# Patient Record
Sex: Female | Born: 1955
Health system: Southern US, Community
[De-identification: ages and names within clinical notes are randomized; demographics above are authoritative.]

## PROBLEM LIST (undated history)

## (undated) DIAGNOSIS — E785 Hyperlipidemia, unspecified: Secondary | ICD-10-CM

## (undated) DIAGNOSIS — M79672 Pain in left foot: Secondary | ICD-10-CM

## (undated) DIAGNOSIS — I10 Essential (primary) hypertension: Secondary | ICD-10-CM

## (undated) HISTORY — DX: Hyperlipidemia, unspecified: E78.5

## (undated) HISTORY — PX: TUBAL LIGATION: SHX77

## (undated) HISTORY — DX: Pain in left foot: M79.672

## (undated) HISTORY — DX: Essential (primary) hypertension: I10

## (undated) HISTORY — PX: EYE SURGERY: SHX253

## (undated) HISTORY — PX: LUMBAR DISC SURGERY: SHX700

---

## 1997-11-02 ENCOUNTER — Encounter: Admission: RE | Admit: 1997-11-02 | Discharge: 1997-11-02 | Payer: Self-pay | Admitting: Family Medicine

## 1997-11-24 ENCOUNTER — Encounter: Admission: RE | Admit: 1997-11-24 | Discharge: 1997-11-24 | Payer: Self-pay | Admitting: Family Medicine

## 1997-12-03 ENCOUNTER — Encounter: Admission: RE | Admit: 1997-12-03 | Discharge: 1997-12-03 | Payer: Self-pay | Admitting: Family Medicine

## 1997-12-20 ENCOUNTER — Encounter: Admission: RE | Admit: 1997-12-20 | Discharge: 1997-12-20 | Payer: Self-pay | Admitting: Family Medicine

## 1997-12-27 ENCOUNTER — Encounter: Admission: RE | Admit: 1997-12-27 | Discharge: 1997-12-27 | Payer: Self-pay | Admitting: Family Medicine

## 1998-01-03 ENCOUNTER — Encounter: Admission: RE | Admit: 1998-01-03 | Discharge: 1998-01-03 | Payer: Self-pay | Admitting: Family Medicine

## 1998-02-11 ENCOUNTER — Encounter: Admission: RE | Admit: 1998-02-11 | Discharge: 1998-02-11 | Payer: Self-pay | Admitting: Family Medicine

## 1998-03-04 ENCOUNTER — Encounter: Admission: RE | Admit: 1998-03-04 | Discharge: 1998-03-04 | Payer: Self-pay | Admitting: Family Medicine

## 1998-03-31 ENCOUNTER — Encounter: Admission: RE | Admit: 1998-03-31 | Discharge: 1998-03-31 | Payer: Self-pay | Admitting: Family Medicine

## 1998-04-11 ENCOUNTER — Encounter: Admission: RE | Admit: 1998-04-11 | Discharge: 1998-04-11 | Payer: Self-pay | Admitting: Family Medicine

## 1998-04-20 ENCOUNTER — Encounter: Admission: RE | Admit: 1998-04-20 | Discharge: 1998-04-20 | Payer: Self-pay | Admitting: Family Medicine

## 1998-04-29 ENCOUNTER — Encounter: Admission: RE | Admit: 1998-04-29 | Discharge: 1998-04-29 | Payer: Self-pay | Admitting: Family Medicine

## 1998-05-13 ENCOUNTER — Encounter: Payer: Self-pay | Admitting: Emergency Medicine

## 1998-05-13 ENCOUNTER — Emergency Department (HOSPITAL_COMMUNITY): Admission: EM | Admit: 1998-05-13 | Discharge: 1998-05-13 | Payer: Self-pay | Admitting: Emergency Medicine

## 1998-05-16 ENCOUNTER — Encounter: Admission: RE | Admit: 1998-05-16 | Discharge: 1998-05-16 | Payer: Self-pay | Admitting: Family Medicine

## 1998-05-18 ENCOUNTER — Encounter: Admission: RE | Admit: 1998-05-18 | Discharge: 1998-05-18 | Payer: Self-pay | Admitting: Family Medicine

## 1998-05-19 ENCOUNTER — Encounter: Admission: RE | Admit: 1998-05-19 | Discharge: 1998-05-19 | Payer: Self-pay | Admitting: Family Medicine

## 1998-05-26 ENCOUNTER — Encounter: Admission: RE | Admit: 1998-05-26 | Discharge: 1998-05-26 | Payer: Self-pay | Admitting: Family Medicine

## 1998-06-13 ENCOUNTER — Encounter: Admission: RE | Admit: 1998-06-13 | Discharge: 1998-06-13 | Payer: Self-pay | Admitting: Family Medicine

## 1998-07-05 ENCOUNTER — Encounter: Admission: RE | Admit: 1998-07-05 | Discharge: 1998-07-05 | Payer: Self-pay | Admitting: Family Medicine

## 1998-07-11 ENCOUNTER — Encounter: Admission: RE | Admit: 1998-07-11 | Discharge: 1998-07-11 | Payer: Self-pay | Admitting: Sports Medicine

## 1998-07-18 ENCOUNTER — Encounter: Admission: RE | Admit: 1998-07-18 | Discharge: 1998-07-18 | Payer: Self-pay | Admitting: Family Medicine

## 1998-08-18 ENCOUNTER — Encounter: Admission: RE | Admit: 1998-08-18 | Discharge: 1998-08-18 | Payer: Self-pay | Admitting: Family Medicine

## 1998-09-09 ENCOUNTER — Encounter: Admission: RE | Admit: 1998-09-09 | Discharge: 1998-09-09 | Payer: Self-pay | Admitting: Family Medicine

## 1998-10-12 ENCOUNTER — Encounter: Admission: RE | Admit: 1998-10-12 | Discharge: 1998-10-12 | Payer: Self-pay | Admitting: Family Medicine

## 1998-10-12 ENCOUNTER — Other Ambulatory Visit: Admission: RE | Admit: 1998-10-12 | Discharge: 1998-10-12 | Payer: Self-pay | Admitting: *Deleted

## 1998-10-14 ENCOUNTER — Encounter: Admission: RE | Admit: 1998-10-14 | Discharge: 1998-10-14 | Payer: Self-pay | Admitting: Family Medicine

## 1998-10-24 ENCOUNTER — Encounter: Admission: RE | Admit: 1998-10-24 | Discharge: 1998-10-24 | Payer: Self-pay | Admitting: Family Medicine

## 1998-10-26 ENCOUNTER — Encounter: Admission: RE | Admit: 1998-10-26 | Discharge: 1998-10-26 | Payer: Self-pay | Admitting: Family Medicine

## 1998-11-01 ENCOUNTER — Encounter: Admission: RE | Admit: 1998-11-01 | Discharge: 1998-11-01 | Payer: Self-pay | Admitting: Family Medicine

## 1998-11-03 ENCOUNTER — Encounter: Admission: RE | Admit: 1998-11-03 | Discharge: 1998-11-03 | Payer: Self-pay | Admitting: Family Medicine

## 1998-11-14 ENCOUNTER — Encounter: Admission: RE | Admit: 1998-11-14 | Discharge: 1998-11-14 | Payer: Self-pay | Admitting: Family Medicine

## 1998-12-07 ENCOUNTER — Encounter: Admission: RE | Admit: 1998-12-07 | Discharge: 1998-12-07 | Payer: Self-pay | Admitting: Family Medicine

## 1998-12-19 ENCOUNTER — Emergency Department (HOSPITAL_COMMUNITY): Admission: EM | Admit: 1998-12-19 | Discharge: 1998-12-19 | Payer: Self-pay | Admitting: Emergency Medicine

## 1998-12-20 ENCOUNTER — Encounter: Payer: Self-pay | Admitting: *Deleted

## 1998-12-30 ENCOUNTER — Encounter: Admission: RE | Admit: 1998-12-30 | Discharge: 1998-12-30 | Payer: Self-pay | Admitting: Family Medicine

## 1999-01-06 ENCOUNTER — Encounter: Admission: RE | Admit: 1999-01-06 | Discharge: 1999-01-06 | Payer: Self-pay | Admitting: Family Medicine

## 1999-01-19 ENCOUNTER — Encounter: Admission: RE | Admit: 1999-01-19 | Discharge: 1999-01-19 | Payer: Self-pay | Admitting: Sports Medicine

## 1999-01-27 ENCOUNTER — Encounter: Admission: RE | Admit: 1999-01-27 | Discharge: 1999-01-27 | Payer: Self-pay | Admitting: Family Medicine

## 1999-02-01 ENCOUNTER — Encounter: Admission: RE | Admit: 1999-02-01 | Discharge: 1999-02-01 | Payer: Self-pay | Admitting: Family Medicine

## 1999-02-06 ENCOUNTER — Encounter: Admission: RE | Admit: 1999-02-06 | Discharge: 1999-02-06 | Payer: Self-pay | Admitting: Family Medicine

## 1999-02-08 ENCOUNTER — Encounter: Admission: RE | Admit: 1999-02-08 | Discharge: 1999-02-08 | Payer: Self-pay | Admitting: Family Medicine

## 1999-02-19 ENCOUNTER — Emergency Department (HOSPITAL_COMMUNITY): Admission: EM | Admit: 1999-02-19 | Discharge: 1999-02-19 | Payer: Self-pay | Admitting: Emergency Medicine

## 1999-02-22 ENCOUNTER — Encounter: Admission: RE | Admit: 1999-02-22 | Discharge: 1999-02-22 | Payer: Self-pay | Admitting: Family Medicine

## 1999-03-17 ENCOUNTER — Encounter: Admission: RE | Admit: 1999-03-17 | Discharge: 1999-03-17 | Payer: Self-pay | Admitting: Family Medicine

## 1999-03-21 ENCOUNTER — Ambulatory Visit (HOSPITAL_COMMUNITY): Admission: RE | Admit: 1999-03-21 | Discharge: 1999-03-21 | Payer: Self-pay | Admitting: Sports Medicine

## 1999-03-21 ENCOUNTER — Encounter: Admission: RE | Admit: 1999-03-21 | Discharge: 1999-03-21 | Payer: Self-pay | Admitting: Sports Medicine

## 1999-03-29 ENCOUNTER — Encounter: Admission: RE | Admit: 1999-03-29 | Discharge: 1999-03-29 | Payer: Self-pay | Admitting: Family Medicine

## 1999-04-03 ENCOUNTER — Encounter: Admission: RE | Admit: 1999-04-03 | Discharge: 1999-04-03 | Payer: Self-pay | Admitting: Family Medicine

## 1999-04-12 ENCOUNTER — Encounter: Admission: RE | Admit: 1999-04-12 | Discharge: 1999-04-12 | Payer: Self-pay | Admitting: Family Medicine

## 1999-05-05 ENCOUNTER — Encounter: Admission: RE | Admit: 1999-05-05 | Discharge: 1999-05-05 | Payer: Self-pay | Admitting: Family Medicine

## 1999-05-08 ENCOUNTER — Encounter: Admission: RE | Admit: 1999-05-08 | Discharge: 1999-05-08 | Payer: Self-pay | Admitting: Family Medicine

## 1999-05-19 ENCOUNTER — Encounter: Admission: RE | Admit: 1999-05-19 | Discharge: 1999-05-19 | Payer: Self-pay | Admitting: Sports Medicine

## 1999-05-27 ENCOUNTER — Encounter: Payer: Self-pay | Admitting: Emergency Medicine

## 1999-05-27 ENCOUNTER — Emergency Department (HOSPITAL_COMMUNITY): Admission: EM | Admit: 1999-05-27 | Discharge: 1999-05-27 | Payer: Self-pay | Admitting: Emergency Medicine

## 1999-05-29 ENCOUNTER — Encounter: Admission: RE | Admit: 1999-05-29 | Discharge: 1999-05-29 | Payer: Self-pay | Admitting: Family Medicine

## 1999-05-31 ENCOUNTER — Encounter: Admission: RE | Admit: 1999-05-31 | Discharge: 1999-05-31 | Payer: Self-pay | Admitting: Family Medicine

## 1999-07-21 ENCOUNTER — Encounter: Admission: RE | Admit: 1999-07-21 | Discharge: 1999-07-21 | Payer: Self-pay | Admitting: Family Medicine

## 1999-07-31 ENCOUNTER — Encounter: Admission: RE | Admit: 1999-07-31 | Discharge: 1999-07-31 | Payer: Self-pay | Admitting: Family Medicine

## 1999-08-07 ENCOUNTER — Encounter: Admission: RE | Admit: 1999-08-07 | Discharge: 1999-08-07 | Payer: Self-pay | Admitting: Family Medicine

## 1999-08-21 ENCOUNTER — Encounter: Admission: RE | Admit: 1999-08-21 | Discharge: 1999-08-21 | Payer: Self-pay | Admitting: Family Medicine

## 1999-09-20 ENCOUNTER — Encounter: Admission: RE | Admit: 1999-09-20 | Discharge: 1999-09-20 | Payer: Self-pay | Admitting: Family Medicine

## 1999-09-27 ENCOUNTER — Encounter: Admission: RE | Admit: 1999-09-27 | Discharge: 1999-09-27 | Payer: Self-pay | Admitting: Family Medicine

## 1999-10-03 ENCOUNTER — Encounter: Admission: RE | Admit: 1999-10-03 | Discharge: 1999-10-03 | Payer: Self-pay | Admitting: Family Medicine

## 1999-10-10 ENCOUNTER — Encounter: Admission: RE | Admit: 1999-10-10 | Discharge: 1999-10-10 | Payer: Self-pay | Admitting: Family Medicine

## 1999-10-18 ENCOUNTER — Other Ambulatory Visit: Admission: RE | Admit: 1999-10-18 | Discharge: 1999-10-18 | Payer: Self-pay | Admitting: Obstetrics & Gynecology

## 1999-10-24 ENCOUNTER — Encounter: Admission: RE | Admit: 1999-10-24 | Discharge: 1999-10-24 | Payer: Self-pay | Admitting: Sports Medicine

## 1999-11-07 ENCOUNTER — Encounter: Admission: RE | Admit: 1999-11-07 | Discharge: 1999-11-07 | Payer: Self-pay | Admitting: Family Medicine

## 1999-11-19 ENCOUNTER — Emergency Department (HOSPITAL_COMMUNITY): Admission: EM | Admit: 1999-11-19 | Discharge: 1999-11-19 | Payer: Self-pay | Admitting: Emergency Medicine

## 1999-11-20 ENCOUNTER — Encounter: Admission: RE | Admit: 1999-11-20 | Discharge: 1999-11-20 | Payer: Self-pay | Admitting: Sports Medicine

## 1999-11-29 ENCOUNTER — Encounter: Admission: RE | Admit: 1999-11-29 | Discharge: 1999-11-29 | Payer: Self-pay | Admitting: Family Medicine

## 1999-12-18 ENCOUNTER — Encounter: Admission: RE | Admit: 1999-12-18 | Discharge: 1999-12-18 | Payer: Self-pay | Admitting: Family Medicine

## 1999-12-22 ENCOUNTER — Encounter: Admission: RE | Admit: 1999-12-22 | Discharge: 1999-12-22 | Payer: Self-pay | Admitting: Family Medicine

## 2000-01-10 ENCOUNTER — Encounter: Admission: RE | Admit: 2000-01-10 | Discharge: 2000-01-10 | Payer: Self-pay | Admitting: Family Medicine

## 2000-01-23 ENCOUNTER — Encounter: Admission: RE | Admit: 2000-01-23 | Discharge: 2000-01-23 | Payer: Self-pay | Admitting: Family Medicine

## 2000-01-30 ENCOUNTER — Encounter: Admission: RE | Admit: 2000-01-30 | Discharge: 2000-01-30 | Payer: Self-pay | Admitting: Family Medicine

## 2000-02-09 ENCOUNTER — Encounter: Admission: RE | Admit: 2000-02-09 | Discharge: 2000-02-09 | Payer: Self-pay | Admitting: Family Medicine

## 2000-02-16 ENCOUNTER — Encounter: Admission: RE | Admit: 2000-02-16 | Discharge: 2000-02-16 | Payer: Self-pay | Admitting: Family Medicine

## 2000-02-20 ENCOUNTER — Encounter: Admission: RE | Admit: 2000-02-20 | Discharge: 2000-02-20 | Payer: Self-pay | Admitting: Family Medicine

## 2000-03-01 ENCOUNTER — Encounter: Admission: RE | Admit: 2000-03-01 | Discharge: 2000-03-01 | Payer: Self-pay | Admitting: Family Medicine

## 2000-03-05 ENCOUNTER — Encounter: Admission: RE | Admit: 2000-03-05 | Discharge: 2000-03-05 | Payer: Self-pay | Admitting: Family Medicine

## 2000-04-17 ENCOUNTER — Encounter: Admission: RE | Admit: 2000-04-17 | Discharge: 2000-04-17 | Payer: Self-pay | Admitting: Family Medicine

## 2000-04-23 ENCOUNTER — Encounter: Payer: Self-pay | Admitting: Family Medicine

## 2000-04-23 ENCOUNTER — Encounter: Admission: RE | Admit: 2000-04-23 | Discharge: 2000-04-23 | Payer: Self-pay | Admitting: *Deleted

## 2000-05-02 ENCOUNTER — Encounter: Admission: RE | Admit: 2000-05-02 | Discharge: 2000-05-02 | Payer: Self-pay | Admitting: Family Medicine

## 2000-05-21 ENCOUNTER — Encounter: Admission: RE | Admit: 2000-05-21 | Discharge: 2000-05-21 | Payer: Self-pay | Admitting: Family Medicine

## 2000-06-06 ENCOUNTER — Encounter: Admission: RE | Admit: 2000-06-06 | Discharge: 2000-06-06 | Payer: Self-pay | Admitting: Family Medicine

## 2000-06-19 ENCOUNTER — Encounter: Admission: RE | Admit: 2000-06-19 | Discharge: 2000-06-19 | Payer: Self-pay | Admitting: Family Medicine

## 2000-07-01 ENCOUNTER — Encounter: Admission: RE | Admit: 2000-07-01 | Discharge: 2000-07-01 | Payer: Self-pay | Admitting: Family Medicine

## 2000-07-10 ENCOUNTER — Encounter: Admission: RE | Admit: 2000-07-10 | Discharge: 2000-07-10 | Payer: Self-pay | Admitting: Family Medicine

## 2000-08-13 ENCOUNTER — Encounter: Admission: RE | Admit: 2000-08-13 | Discharge: 2000-08-13 | Payer: Self-pay | Admitting: Family Medicine

## 2000-08-21 ENCOUNTER — Encounter: Admission: RE | Admit: 2000-08-21 | Discharge: 2000-08-21 | Payer: Self-pay | Admitting: Family Medicine

## 2000-09-13 ENCOUNTER — Encounter: Admission: RE | Admit: 2000-09-13 | Discharge: 2000-09-13 | Payer: Self-pay | Admitting: Family Medicine

## 2000-10-11 ENCOUNTER — Encounter: Admission: RE | Admit: 2000-10-11 | Discharge: 2000-10-11 | Payer: Self-pay | Admitting: Family Medicine

## 2000-11-01 ENCOUNTER — Encounter: Admission: RE | Admit: 2000-11-01 | Discharge: 2000-11-01 | Payer: Self-pay | Admitting: Family Medicine

## 2001-01-06 ENCOUNTER — Encounter: Admission: RE | Admit: 2001-01-06 | Discharge: 2001-01-06 | Payer: Self-pay | Admitting: Family Medicine

## 2001-02-07 ENCOUNTER — Encounter: Admission: RE | Admit: 2001-02-07 | Discharge: 2001-02-07 | Payer: Self-pay | Admitting: Family Medicine

## 2001-03-11 ENCOUNTER — Encounter: Admission: RE | Admit: 2001-03-11 | Discharge: 2001-03-11 | Payer: Self-pay | Admitting: Family Medicine

## 2001-03-12 ENCOUNTER — Encounter: Admission: RE | Admit: 2001-03-12 | Discharge: 2001-03-12 | Payer: Self-pay | Admitting: Family Medicine

## 2001-03-25 ENCOUNTER — Encounter: Admission: RE | Admit: 2001-03-25 | Discharge: 2001-03-25 | Payer: Self-pay | Admitting: Family Medicine

## 2001-04-04 ENCOUNTER — Encounter: Admission: RE | Admit: 2001-04-04 | Discharge: 2001-04-04 | Payer: Self-pay | Admitting: Family Medicine

## 2001-05-21 ENCOUNTER — Encounter: Admission: RE | Admit: 2001-05-21 | Discharge: 2001-05-21 | Payer: Self-pay | Admitting: Family Medicine

## 2001-05-29 ENCOUNTER — Encounter: Admission: RE | Admit: 2001-05-29 | Discharge: 2001-05-29 | Payer: Self-pay | Admitting: Family Medicine

## 2001-06-04 ENCOUNTER — Encounter: Admission: RE | Admit: 2001-06-04 | Discharge: 2001-06-04 | Payer: Self-pay | Admitting: Family Medicine

## 2001-07-17 ENCOUNTER — Encounter: Admission: RE | Admit: 2001-07-17 | Discharge: 2001-07-17 | Payer: Self-pay | Admitting: Family Medicine

## 2001-07-17 ENCOUNTER — Encounter: Admission: RE | Admit: 2001-07-17 | Discharge: 2001-07-17 | Payer: Self-pay | Admitting: *Deleted

## 2001-07-21 ENCOUNTER — Encounter: Admission: RE | Admit: 2001-07-21 | Discharge: 2001-07-21 | Payer: Self-pay | Admitting: Family Medicine

## 2001-08-11 ENCOUNTER — Encounter: Admission: RE | Admit: 2001-08-11 | Discharge: 2001-08-11 | Payer: Self-pay | Admitting: Sports Medicine

## 2001-08-19 ENCOUNTER — Encounter: Admission: RE | Admit: 2001-08-19 | Discharge: 2001-08-19 | Payer: Self-pay | Admitting: Family Medicine

## 2001-09-11 ENCOUNTER — Encounter: Admission: RE | Admit: 2001-09-11 | Discharge: 2001-09-11 | Payer: Self-pay | Admitting: Family Medicine

## 2001-10-27 ENCOUNTER — Encounter: Admission: RE | Admit: 2001-10-27 | Discharge: 2001-10-27 | Payer: Self-pay | Admitting: Family Medicine

## 2001-11-03 ENCOUNTER — Encounter: Admission: RE | Admit: 2001-11-03 | Discharge: 2001-11-03 | Payer: Self-pay | Admitting: Family Medicine

## 2001-12-24 ENCOUNTER — Encounter: Admission: RE | Admit: 2001-12-24 | Discharge: 2001-12-24 | Payer: Self-pay | Admitting: Family Medicine

## 2002-01-19 ENCOUNTER — Encounter: Admission: RE | Admit: 2002-01-19 | Discharge: 2002-01-19 | Payer: Self-pay | Admitting: Family Medicine

## 2002-02-18 ENCOUNTER — Encounter: Admission: RE | Admit: 2002-02-18 | Discharge: 2002-02-18 | Payer: Self-pay | Admitting: Family Medicine

## 2002-03-04 ENCOUNTER — Encounter: Admission: RE | Admit: 2002-03-04 | Discharge: 2002-03-04 | Payer: Self-pay | Admitting: Family Medicine

## 2002-03-10 ENCOUNTER — Encounter: Admission: RE | Admit: 2002-03-10 | Discharge: 2002-03-10 | Payer: Self-pay | Admitting: Family Medicine

## 2002-03-10 ENCOUNTER — Encounter: Payer: Self-pay | Admitting: Family Medicine

## 2002-03-18 ENCOUNTER — Encounter: Admission: RE | Admit: 2002-03-18 | Discharge: 2002-03-18 | Payer: Self-pay | Admitting: Family Medicine

## 2002-04-02 ENCOUNTER — Encounter: Admission: RE | Admit: 2002-04-02 | Discharge: 2002-04-02 | Payer: Self-pay | Admitting: Family Medicine

## 2002-04-15 ENCOUNTER — Encounter: Admission: RE | Admit: 2002-04-15 | Discharge: 2002-04-15 | Payer: Self-pay | Admitting: Family Medicine

## 2002-04-20 ENCOUNTER — Encounter: Admission: RE | Admit: 2002-04-20 | Discharge: 2002-04-20 | Payer: Self-pay | Admitting: Sports Medicine

## 2002-04-22 ENCOUNTER — Encounter: Admission: RE | Admit: 2002-04-22 | Discharge: 2002-04-22 | Payer: Self-pay | Admitting: Family Medicine

## 2002-05-05 ENCOUNTER — Encounter: Admission: RE | Admit: 2002-05-05 | Discharge: 2002-05-05 | Payer: Self-pay | Admitting: Family Medicine

## 2002-05-06 ENCOUNTER — Encounter: Admission: RE | Admit: 2002-05-06 | Discharge: 2002-05-06 | Payer: Self-pay | Admitting: Family Medicine

## 2002-05-08 ENCOUNTER — Encounter: Admission: RE | Admit: 2002-05-08 | Discharge: 2002-05-08 | Payer: Self-pay | Admitting: Family Medicine

## 2002-05-11 ENCOUNTER — Encounter: Admission: RE | Admit: 2002-05-11 | Discharge: 2002-05-11 | Payer: Self-pay | Admitting: Family Medicine

## 2002-05-18 ENCOUNTER — Encounter: Admission: RE | Admit: 2002-05-18 | Discharge: 2002-05-18 | Payer: Self-pay | Admitting: Family Medicine

## 2002-05-27 ENCOUNTER — Encounter: Admission: RE | Admit: 2002-05-27 | Discharge: 2002-05-27 | Payer: Self-pay | Admitting: Family Medicine

## 2002-06-02 ENCOUNTER — Encounter: Admission: RE | Admit: 2002-06-02 | Discharge: 2002-06-02 | Payer: Self-pay | Admitting: Family Medicine

## 2002-06-04 ENCOUNTER — Encounter: Admission: RE | Admit: 2002-06-04 | Discharge: 2002-06-04 | Payer: Self-pay | Admitting: Family Medicine

## 2002-06-08 ENCOUNTER — Encounter: Admission: RE | Admit: 2002-06-08 | Discharge: 2002-06-08 | Payer: Self-pay | Admitting: Family Medicine

## 2002-06-22 ENCOUNTER — Encounter: Admission: RE | Admit: 2002-06-22 | Discharge: 2002-06-22 | Payer: Self-pay | Admitting: Family Medicine

## 2002-07-28 ENCOUNTER — Encounter: Admission: RE | Admit: 2002-07-28 | Discharge: 2002-07-28 | Payer: Self-pay | Admitting: Family Medicine

## 2002-08-27 ENCOUNTER — Encounter: Admission: RE | Admit: 2002-08-27 | Discharge: 2002-08-27 | Payer: Self-pay | Admitting: Family Medicine

## 2002-09-03 ENCOUNTER — Encounter: Admission: RE | Admit: 2002-09-03 | Discharge: 2002-09-03 | Payer: Self-pay | Admitting: Family Medicine

## 2002-09-22 ENCOUNTER — Encounter: Admission: RE | Admit: 2002-09-22 | Discharge: 2002-09-22 | Payer: Self-pay | Admitting: Family Medicine

## 2002-10-07 ENCOUNTER — Encounter: Admission: RE | Admit: 2002-10-07 | Discharge: 2002-10-07 | Payer: Self-pay | Admitting: Family Medicine

## 2002-10-09 ENCOUNTER — Encounter: Admission: RE | Admit: 2002-10-09 | Discharge: 2002-10-09 | Payer: Self-pay | Admitting: Family Medicine

## 2002-11-02 ENCOUNTER — Encounter: Admission: RE | Admit: 2002-11-02 | Discharge: 2002-11-02 | Payer: Self-pay | Admitting: Family Medicine

## 2002-11-03 ENCOUNTER — Encounter: Admission: RE | Admit: 2002-11-03 | Discharge: 2002-11-03 | Payer: Self-pay | Admitting: Family Medicine

## 2002-12-04 ENCOUNTER — Encounter: Admission: RE | Admit: 2002-12-04 | Discharge: 2002-12-04 | Payer: Self-pay | Admitting: Family Medicine

## 2002-12-08 ENCOUNTER — Encounter: Admission: RE | Admit: 2002-12-08 | Discharge: 2002-12-08 | Payer: Self-pay | Admitting: Family Medicine

## 2002-12-09 ENCOUNTER — Encounter: Admission: RE | Admit: 2002-12-09 | Discharge: 2002-12-09 | Payer: Self-pay | Admitting: Family Medicine

## 2002-12-15 ENCOUNTER — Encounter: Admission: RE | Admit: 2002-12-15 | Discharge: 2002-12-15 | Payer: Self-pay | Admitting: Sports Medicine

## 2002-12-18 ENCOUNTER — Encounter: Admission: RE | Admit: 2002-12-18 | Discharge: 2002-12-18 | Payer: Self-pay | Admitting: Family Medicine

## 2002-12-30 ENCOUNTER — Encounter: Admission: RE | Admit: 2002-12-30 | Discharge: 2002-12-30 | Payer: Self-pay | Admitting: Family Medicine

## 2003-01-01 ENCOUNTER — Emergency Department (HOSPITAL_COMMUNITY): Admission: EM | Admit: 2003-01-01 | Discharge: 2003-01-01 | Payer: Self-pay | Admitting: Emergency Medicine

## 2003-01-04 ENCOUNTER — Encounter: Admission: RE | Admit: 2003-01-04 | Discharge: 2003-01-04 | Payer: Self-pay | Admitting: Family Medicine

## 2003-01-04 ENCOUNTER — Encounter: Payer: Self-pay | Admitting: Sports Medicine

## 2003-01-04 ENCOUNTER — Encounter: Admission: RE | Admit: 2003-01-04 | Discharge: 2003-01-04 | Payer: Self-pay | Admitting: Sports Medicine

## 2003-01-08 ENCOUNTER — Encounter: Admission: RE | Admit: 2003-01-08 | Discharge: 2003-01-08 | Payer: Self-pay | Admitting: Family Medicine

## 2003-01-13 ENCOUNTER — Encounter: Payer: Self-pay | Admitting: Emergency Medicine

## 2003-01-13 ENCOUNTER — Emergency Department (HOSPITAL_COMMUNITY): Admission: EM | Admit: 2003-01-13 | Discharge: 2003-01-13 | Payer: Self-pay | Admitting: Emergency Medicine

## 2003-01-20 ENCOUNTER — Encounter: Admission: RE | Admit: 2003-01-20 | Discharge: 2003-01-20 | Payer: Self-pay | Admitting: Family Medicine

## 2003-02-10 ENCOUNTER — Encounter: Admission: RE | Admit: 2003-02-10 | Discharge: 2003-02-10 | Payer: Self-pay | Admitting: Family Medicine

## 2003-02-17 ENCOUNTER — Ambulatory Visit (HOSPITAL_COMMUNITY): Admission: RE | Admit: 2003-02-17 | Discharge: 2003-02-17 | Payer: Self-pay | Admitting: Family Medicine

## 2003-03-04 ENCOUNTER — Encounter: Admission: RE | Admit: 2003-03-04 | Discharge: 2003-03-04 | Payer: Self-pay | Admitting: Family Medicine

## 2003-03-09 ENCOUNTER — Encounter: Admission: RE | Admit: 2003-03-09 | Discharge: 2003-03-09 | Payer: Self-pay | Admitting: Family Medicine

## 2003-03-17 ENCOUNTER — Encounter: Admission: RE | Admit: 2003-03-17 | Discharge: 2003-03-17 | Payer: Self-pay | Admitting: Family Medicine

## 2003-05-12 ENCOUNTER — Encounter: Admission: RE | Admit: 2003-05-12 | Discharge: 2003-05-12 | Payer: Self-pay | Admitting: Family Medicine

## 2003-05-17 ENCOUNTER — Encounter: Admission: RE | Admit: 2003-05-17 | Discharge: 2003-05-17 | Payer: Self-pay | Admitting: Family Medicine

## 2003-05-19 ENCOUNTER — Encounter: Admission: RE | Admit: 2003-05-19 | Discharge: 2003-05-19 | Payer: Self-pay | Admitting: Family Medicine

## 2003-06-03 ENCOUNTER — Encounter: Admission: RE | Admit: 2003-06-03 | Discharge: 2003-06-03 | Payer: Self-pay | Admitting: Anesthesiology

## 2003-06-08 ENCOUNTER — Encounter: Admission: RE | Admit: 2003-06-08 | Discharge: 2003-06-08 | Payer: Self-pay | Admitting: Family Medicine

## 2003-06-09 ENCOUNTER — Encounter: Admission: RE | Admit: 2003-06-09 | Discharge: 2003-06-09 | Payer: Self-pay | Admitting: Family Medicine

## 2003-07-12 ENCOUNTER — Encounter: Admission: RE | Admit: 2003-07-12 | Discharge: 2003-07-12 | Payer: Self-pay | Admitting: Family Medicine

## 2003-07-24 DIAGNOSIS — M79672 Pain in left foot: Secondary | ICD-10-CM

## 2003-07-24 HISTORY — DX: Pain in left foot: M79.672

## 2003-07-28 ENCOUNTER — Encounter: Admission: RE | Admit: 2003-07-28 | Discharge: 2003-07-28 | Payer: Self-pay | Admitting: Family Medicine

## 2003-08-27 ENCOUNTER — Ambulatory Visit (HOSPITAL_COMMUNITY): Admission: RE | Admit: 2003-08-27 | Discharge: 2003-08-27 | Payer: Self-pay | Admitting: Family Medicine

## 2003-08-27 ENCOUNTER — Encounter: Admission: RE | Admit: 2003-08-27 | Discharge: 2003-08-27 | Payer: Self-pay | Admitting: Family Medicine

## 2003-08-27 ENCOUNTER — Encounter: Admission: RE | Admit: 2003-08-27 | Discharge: 2003-08-27 | Payer: Self-pay | Admitting: Sports Medicine

## 2003-09-21 ENCOUNTER — Encounter: Admission: RE | Admit: 2003-09-21 | Discharge: 2003-09-21 | Payer: Self-pay | Admitting: Family Medicine

## 2003-10-07 ENCOUNTER — Encounter: Admission: RE | Admit: 2003-10-07 | Discharge: 2003-10-07 | Payer: Self-pay | Admitting: Sports Medicine

## 2003-10-22 ENCOUNTER — Encounter: Admission: RE | Admit: 2003-10-22 | Discharge: 2003-10-22 | Payer: Self-pay | Admitting: Family Medicine

## 2003-10-29 ENCOUNTER — Encounter: Admission: RE | Admit: 2003-10-29 | Discharge: 2003-10-29 | Payer: Self-pay | Admitting: Family Medicine

## 2003-11-08 ENCOUNTER — Encounter: Admission: RE | Admit: 2003-11-08 | Discharge: 2003-11-08 | Payer: Self-pay | Admitting: Family Medicine

## 2003-11-21 ENCOUNTER — Encounter (INDEPENDENT_AMBULATORY_CARE_PROVIDER_SITE_OTHER): Payer: Self-pay | Admitting: *Deleted

## 2003-12-08 ENCOUNTER — Encounter: Admission: RE | Admit: 2003-12-08 | Discharge: 2003-12-08 | Payer: Self-pay | Admitting: Sports Medicine

## 2003-12-08 ENCOUNTER — Ambulatory Visit (HOSPITAL_COMMUNITY): Admission: RE | Admit: 2003-12-08 | Discharge: 2003-12-08 | Payer: Self-pay | Admitting: Sports Medicine

## 2003-12-14 ENCOUNTER — Encounter: Admission: RE | Admit: 2003-12-14 | Discharge: 2003-12-14 | Payer: Self-pay | Admitting: Sports Medicine

## 2003-12-30 ENCOUNTER — Encounter: Admission: RE | Admit: 2003-12-30 | Discharge: 2003-12-30 | Payer: Self-pay | Admitting: Family Medicine

## 2004-01-02 ENCOUNTER — Emergency Department (HOSPITAL_COMMUNITY): Admission: EM | Admit: 2004-01-02 | Discharge: 2004-01-03 | Payer: Self-pay | Admitting: Emergency Medicine

## 2004-01-06 ENCOUNTER — Encounter: Admission: RE | Admit: 2004-01-06 | Discharge: 2004-01-06 | Payer: Self-pay | Admitting: Family Medicine

## 2004-01-15 ENCOUNTER — Emergency Department (HOSPITAL_COMMUNITY): Admission: EM | Admit: 2004-01-15 | Discharge: 2004-01-15 | Payer: Self-pay | Admitting: Family Medicine

## 2004-01-31 ENCOUNTER — Encounter: Admission: RE | Admit: 2004-01-31 | Discharge: 2004-01-31 | Payer: Self-pay | Admitting: Family Medicine

## 2004-02-15 ENCOUNTER — Encounter: Admission: RE | Admit: 2004-02-15 | Discharge: 2004-02-15 | Payer: Self-pay | Admitting: Family Medicine

## 2004-02-22 ENCOUNTER — Encounter: Admission: RE | Admit: 2004-02-22 | Discharge: 2004-02-22 | Payer: Self-pay | Admitting: Family Medicine

## 2004-03-03 ENCOUNTER — Ambulatory Visit (HOSPITAL_COMMUNITY): Admission: RE | Admit: 2004-03-03 | Discharge: 2004-03-03 | Payer: Self-pay | Admitting: Family Medicine

## 2004-03-20 ENCOUNTER — Encounter: Admission: RE | Admit: 2004-03-20 | Discharge: 2004-06-18 | Payer: Self-pay | Admitting: Family Medicine

## 2004-04-11 ENCOUNTER — Ambulatory Visit: Payer: Self-pay | Admitting: Family Medicine

## 2004-05-02 ENCOUNTER — Ambulatory Visit: Payer: Self-pay | Admitting: Sports Medicine

## 2004-05-08 ENCOUNTER — Ambulatory Visit: Payer: Self-pay | Admitting: Sports Medicine

## 2004-06-20 ENCOUNTER — Ambulatory Visit: Payer: Self-pay | Admitting: Family Medicine

## 2004-07-19 ENCOUNTER — Ambulatory Visit: Payer: Self-pay | Admitting: Sports Medicine

## 2004-07-31 ENCOUNTER — Ambulatory Visit: Payer: Self-pay | Admitting: Family Medicine

## 2004-08-21 ENCOUNTER — Ambulatory Visit: Payer: Self-pay | Admitting: Family Medicine

## 2004-08-28 ENCOUNTER — Ambulatory Visit: Payer: Self-pay

## 2004-09-07 ENCOUNTER — Ambulatory Visit: Payer: Self-pay | Admitting: Sports Medicine

## 2004-09-18 ENCOUNTER — Ambulatory Visit: Payer: Self-pay | Admitting: Family Medicine

## 2004-11-08 ENCOUNTER — Ambulatory Visit: Payer: Self-pay | Admitting: Family Medicine

## 2004-12-06 ENCOUNTER — Ambulatory Visit: Payer: Self-pay | Admitting: Family Medicine

## 2004-12-06 ENCOUNTER — Encounter: Admission: RE | Admit: 2004-12-06 | Discharge: 2004-12-06 | Payer: Self-pay | Admitting: Sports Medicine

## 2004-12-25 ENCOUNTER — Ambulatory Visit: Payer: Self-pay | Admitting: Family Medicine

## 2005-01-04 ENCOUNTER — Ambulatory Visit: Payer: Self-pay | Admitting: Family Medicine

## 2005-01-04 ENCOUNTER — Encounter: Admission: RE | Admit: 2005-01-04 | Discharge: 2005-01-04 | Payer: Self-pay | Admitting: Family Medicine

## 2005-01-09 ENCOUNTER — Ambulatory Visit: Payer: Self-pay | Admitting: Family Medicine

## 2005-01-15 ENCOUNTER — Ambulatory Visit: Payer: Self-pay | Admitting: Family Medicine

## 2005-01-30 ENCOUNTER — Ambulatory Visit: Payer: Self-pay | Admitting: Family Medicine

## 2005-02-05 ENCOUNTER — Ambulatory Visit: Payer: Self-pay | Admitting: Family Medicine

## 2005-02-23 ENCOUNTER — Ambulatory Visit: Payer: Self-pay | Admitting: Family Medicine

## 2005-05-07 ENCOUNTER — Ambulatory Visit: Payer: Self-pay | Admitting: Family Medicine

## 2005-05-09 ENCOUNTER — Ambulatory Visit: Payer: Self-pay | Admitting: Family Medicine

## 2005-05-11 ENCOUNTER — Ambulatory Visit: Payer: Self-pay | Admitting: Sports Medicine

## 2005-05-15 ENCOUNTER — Ambulatory Visit: Payer: Self-pay | Admitting: Family Medicine

## 2005-05-30 ENCOUNTER — Encounter: Admission: RE | Admit: 2005-05-30 | Discharge: 2005-05-30 | Payer: Self-pay | Admitting: Sports Medicine

## 2005-05-30 ENCOUNTER — Ambulatory Visit: Payer: Self-pay | Admitting: Family Medicine

## 2005-06-06 ENCOUNTER — Ambulatory Visit: Payer: Self-pay | Admitting: Family Medicine

## 2005-06-20 ENCOUNTER — Ambulatory Visit: Payer: Self-pay | Admitting: Family Medicine

## 2005-07-06 ENCOUNTER — Ambulatory Visit: Payer: Self-pay | Admitting: Family Medicine

## 2005-08-07 ENCOUNTER — Ambulatory Visit: Payer: Self-pay | Admitting: Family Medicine

## 2005-08-29 ENCOUNTER — Ambulatory Visit (HOSPITAL_COMMUNITY): Admission: RE | Admit: 2005-08-29 | Discharge: 2005-08-29 | Payer: Self-pay | Admitting: Family Medicine

## 2005-09-19 ENCOUNTER — Ambulatory Visit: Payer: Self-pay | Admitting: Family Medicine

## 2005-09-24 ENCOUNTER — Ambulatory Visit: Payer: Self-pay | Admitting: Sports Medicine

## 2005-09-24 ENCOUNTER — Encounter: Admission: RE | Admit: 2005-09-24 | Discharge: 2005-09-24 | Payer: Self-pay | Admitting: Sports Medicine

## 2005-10-01 ENCOUNTER — Ambulatory Visit: Payer: Self-pay | Admitting: Family Medicine

## 2005-11-01 ENCOUNTER — Ambulatory Visit: Payer: Self-pay | Admitting: Family Medicine

## 2005-11-09 ENCOUNTER — Ambulatory Visit: Payer: Self-pay | Admitting: Family Medicine

## 2005-12-26 ENCOUNTER — Ambulatory Visit: Payer: Self-pay | Admitting: Family Medicine

## 2006-01-01 ENCOUNTER — Ambulatory Visit: Payer: Self-pay | Admitting: Family Medicine

## 2006-01-16 ENCOUNTER — Ambulatory Visit: Payer: Self-pay | Admitting: Family Medicine

## 2006-01-21 ENCOUNTER — Ambulatory Visit: Payer: Self-pay | Admitting: Family Medicine

## 2006-01-31 ENCOUNTER — Ambulatory Visit: Payer: Self-pay | Admitting: Family Medicine

## 2006-02-18 ENCOUNTER — Ambulatory Visit: Payer: Self-pay | Admitting: Sports Medicine

## 2006-03-06 ENCOUNTER — Ambulatory Visit: Payer: Self-pay | Admitting: Sports Medicine

## 2006-04-05 ENCOUNTER — Ambulatory Visit: Payer: Self-pay | Admitting: Family Medicine

## 2006-05-02 ENCOUNTER — Ambulatory Visit: Payer: Self-pay | Admitting: Family Medicine

## 2006-05-06 ENCOUNTER — Ambulatory Visit: Payer: Self-pay | Admitting: Sports Medicine

## 2006-05-06 ENCOUNTER — Encounter: Admission: RE | Admit: 2006-05-06 | Discharge: 2006-05-06 | Payer: Self-pay | Admitting: Sports Medicine

## 2006-05-09 ENCOUNTER — Ambulatory Visit: Payer: Self-pay | Admitting: Sports Medicine

## 2006-05-29 ENCOUNTER — Ambulatory Visit: Payer: Self-pay | Admitting: Family Medicine

## 2006-06-03 ENCOUNTER — Ambulatory Visit: Payer: Self-pay | Admitting: Family Medicine

## 2006-06-03 ENCOUNTER — Encounter: Admission: RE | Admit: 2006-06-03 | Discharge: 2006-06-03 | Payer: Self-pay | Admitting: Sports Medicine

## 2006-06-10 ENCOUNTER — Ambulatory Visit: Payer: Self-pay | Admitting: Sports Medicine

## 2006-07-19 ENCOUNTER — Ambulatory Visit: Payer: Self-pay | Admitting: Family Medicine

## 2006-08-07 ENCOUNTER — Ambulatory Visit: Payer: Self-pay | Admitting: Sports Medicine

## 2006-08-12 ENCOUNTER — Ambulatory Visit: Payer: Self-pay | Admitting: Sports Medicine

## 2006-08-29 ENCOUNTER — Ambulatory Visit: Payer: Self-pay | Admitting: Family Medicine

## 2006-09-13 ENCOUNTER — Ambulatory Visit: Payer: Self-pay | Admitting: Family Medicine

## 2006-09-19 DIAGNOSIS — K449 Diaphragmatic hernia without obstruction or gangrene: Secondary | ICD-10-CM | POA: Insufficient documentation

## 2006-09-19 DIAGNOSIS — E785 Hyperlipidemia, unspecified: Secondary | ICD-10-CM | POA: Insufficient documentation

## 2006-09-19 DIAGNOSIS — I10 Essential (primary) hypertension: Secondary | ICD-10-CM | POA: Insufficient documentation

## 2006-09-19 HISTORY — DX: Diaphragmatic hernia without obstruction or gangrene: K44.9

## 2006-09-20 ENCOUNTER — Encounter (INDEPENDENT_AMBULATORY_CARE_PROVIDER_SITE_OTHER): Payer: Self-pay | Admitting: *Deleted

## 2006-10-04 ENCOUNTER — Ambulatory Visit: Payer: Self-pay | Admitting: Family Medicine

## 2006-10-04 ENCOUNTER — Telehealth: Payer: Self-pay | Admitting: *Deleted

## 2006-12-03 ENCOUNTER — Telehealth: Payer: Self-pay | Admitting: *Deleted

## 2006-12-20 ENCOUNTER — Ambulatory Visit: Payer: Self-pay | Admitting: Family Medicine

## 2006-12-20 ENCOUNTER — Telehealth: Payer: Self-pay | Admitting: *Deleted

## 2006-12-23 ENCOUNTER — Encounter: Admission: RE | Admit: 2006-12-23 | Discharge: 2006-12-23 | Payer: Self-pay | Admitting: Sports Medicine

## 2006-12-24 ENCOUNTER — Telehealth (INDEPENDENT_AMBULATORY_CARE_PROVIDER_SITE_OTHER): Payer: Self-pay | Admitting: *Deleted

## 2006-12-27 ENCOUNTER — Telehealth: Payer: Self-pay | Admitting: *Deleted

## 2006-12-27 ENCOUNTER — Ambulatory Visit: Payer: Self-pay | Admitting: Family Medicine

## 2007-01-01 ENCOUNTER — Ambulatory Visit: Payer: Self-pay | Admitting: Family Medicine

## 2007-01-01 ENCOUNTER — Encounter (INDEPENDENT_AMBULATORY_CARE_PROVIDER_SITE_OTHER): Payer: Self-pay | Admitting: Family Medicine

## 2007-01-01 LAB — CONVERTED CEMR LAB
AST: 33 units/L (ref 0–37)
Albumin: 3.8 g/dL (ref 3.5–5.2)
BUN: 9 mg/dL (ref 6–23)
CO2: 26 meq/L (ref 19–32)
Calcium: 9.2 mg/dL (ref 8.4–10.5)
Chloride: 99 meq/L (ref 96–112)
Cholesterol: 194 mg/dL (ref 0–200)
Glucose, Bld: 133 mg/dL — ABNORMAL HIGH (ref 70–99)
HDL: 38 mg/dL — ABNORMAL LOW (ref >39)
Potassium: 3.6 meq/L (ref 3.5–5.3)
TSH: 0.973 microintl units/mL (ref 0.350–5.50)
Triglycerides: 107 mg/dL (ref <150)

## 2007-01-09 ENCOUNTER — Telehealth: Payer: Self-pay | Admitting: *Deleted

## 2007-01-10 ENCOUNTER — Telehealth (INDEPENDENT_AMBULATORY_CARE_PROVIDER_SITE_OTHER): Payer: Self-pay | Admitting: *Deleted

## 2007-01-15 ENCOUNTER — Telehealth (INDEPENDENT_AMBULATORY_CARE_PROVIDER_SITE_OTHER): Payer: Self-pay | Admitting: Family Medicine

## 2007-01-29 ENCOUNTER — Telehealth (INDEPENDENT_AMBULATORY_CARE_PROVIDER_SITE_OTHER): Payer: Self-pay | Admitting: *Deleted

## 2007-02-11 ENCOUNTER — Ambulatory Visit: Payer: Self-pay | Admitting: Family Medicine

## 2007-02-11 ENCOUNTER — Encounter (INDEPENDENT_AMBULATORY_CARE_PROVIDER_SITE_OTHER): Payer: Self-pay | Admitting: Family Medicine

## 2007-02-11 ENCOUNTER — Telehealth (INDEPENDENT_AMBULATORY_CARE_PROVIDER_SITE_OTHER): Payer: Self-pay | Admitting: *Deleted

## 2007-02-11 LAB — CONVERTED CEMR LAB
AST: 43 units/L — ABNORMAL HIGH (ref 0–37)
BUN: 8 mg/dL (ref 6–23)
Calcium: 9.3 mg/dL (ref 8.4–10.5)
Chloride: 102 meq/L (ref 96–112)
Creatinine, Ser: 0.85 mg/dL (ref 0.40–1.20)
HCT: 38.3 % (ref 36.0–46.0)
Hemoglobin: 12 g/dL (ref 12.0–15.0)
RDW: 17.5 % — ABNORMAL HIGH (ref 11.5–14.0)

## 2007-02-12 ENCOUNTER — Telehealth: Payer: Self-pay | Admitting: *Deleted

## 2007-02-13 ENCOUNTER — Ambulatory Visit: Payer: Self-pay | Admitting: Family Medicine

## 2007-02-13 ENCOUNTER — Ambulatory Visit (HOSPITAL_COMMUNITY): Admission: RE | Admit: 2007-02-13 | Discharge: 2007-02-13 | Payer: Self-pay | Admitting: Family Medicine

## 2007-02-25 ENCOUNTER — Encounter (INDEPENDENT_AMBULATORY_CARE_PROVIDER_SITE_OTHER): Payer: Self-pay | Admitting: Family Medicine

## 2007-02-27 ENCOUNTER — Ambulatory Visit: Payer: Self-pay | Admitting: Family Medicine

## 2007-02-27 LAB — CONVERTED CEMR LAB
Cholesterol, target level: 200 mg/dL
HDL goal, serum: 40 mg/dL

## 2007-03-12 ENCOUNTER — Telehealth (INDEPENDENT_AMBULATORY_CARE_PROVIDER_SITE_OTHER): Payer: Self-pay | Admitting: Family Medicine

## 2007-03-14 ENCOUNTER — Ambulatory Visit: Payer: Self-pay | Admitting: Family Medicine

## 2007-03-14 ENCOUNTER — Telehealth (INDEPENDENT_AMBULATORY_CARE_PROVIDER_SITE_OTHER): Payer: Self-pay | Admitting: *Deleted

## 2007-04-06 ENCOUNTER — Telehealth (INDEPENDENT_AMBULATORY_CARE_PROVIDER_SITE_OTHER): Payer: Self-pay | Admitting: Family Medicine

## 2007-04-16 ENCOUNTER — Ambulatory Visit: Payer: Self-pay | Admitting: Family Medicine

## 2007-04-16 ENCOUNTER — Telehealth (INDEPENDENT_AMBULATORY_CARE_PROVIDER_SITE_OTHER): Payer: Self-pay | Admitting: *Deleted

## 2007-04-16 ENCOUNTER — Encounter (INDEPENDENT_AMBULATORY_CARE_PROVIDER_SITE_OTHER): Payer: Self-pay | Admitting: Family Medicine

## 2007-04-16 LAB — CONVERTED CEMR LAB
CO2: 26 meq/L (ref 19–32)
Calcium: 9.6 mg/dL (ref 8.4–10.5)
Direct LDL: 134 mg/dL — ABNORMAL HIGH
Sodium: 136 meq/L (ref 135–145)

## 2007-04-23 ENCOUNTER — Encounter (INDEPENDENT_AMBULATORY_CARE_PROVIDER_SITE_OTHER): Payer: Self-pay | Admitting: Family Medicine

## 2007-04-23 ENCOUNTER — Ambulatory Visit: Payer: Self-pay | Admitting: Family Medicine

## 2007-04-23 LAB — CONVERTED CEMR LAB: TSH: 1.006 microintl units/mL (ref 0.350–5.50)

## 2007-04-29 ENCOUNTER — Ambulatory Visit: Payer: Self-pay | Admitting: Family Medicine

## 2007-04-29 ENCOUNTER — Telehealth (INDEPENDENT_AMBULATORY_CARE_PROVIDER_SITE_OTHER): Payer: Self-pay | Admitting: Family Medicine

## 2007-04-30 ENCOUNTER — Telehealth (INDEPENDENT_AMBULATORY_CARE_PROVIDER_SITE_OTHER): Payer: Self-pay | Admitting: Family Medicine

## 2007-05-02 ENCOUNTER — Telehealth (INDEPENDENT_AMBULATORY_CARE_PROVIDER_SITE_OTHER): Payer: Self-pay | Admitting: Family Medicine

## 2007-05-05 ENCOUNTER — Telehealth: Payer: Self-pay | Admitting: *Deleted

## 2007-05-08 ENCOUNTER — Telehealth: Payer: Self-pay | Admitting: *Deleted

## 2007-05-08 DIAGNOSIS — E119 Type 2 diabetes mellitus without complications: Secondary | ICD-10-CM

## 2007-05-08 DIAGNOSIS — R7303 Prediabetes: Secondary | ICD-10-CM | POA: Insufficient documentation

## 2007-05-12 ENCOUNTER — Telehealth: Payer: Self-pay | Admitting: *Deleted

## 2007-05-19 ENCOUNTER — Ambulatory Visit: Payer: Self-pay | Admitting: Sports Medicine

## 2007-05-22 ENCOUNTER — Telehealth (INDEPENDENT_AMBULATORY_CARE_PROVIDER_SITE_OTHER): Payer: Self-pay | Admitting: *Deleted

## 2007-05-23 ENCOUNTER — Ambulatory Visit: Payer: Self-pay | Admitting: Family Medicine

## 2007-05-23 ENCOUNTER — Ambulatory Visit (HOSPITAL_COMMUNITY): Admission: RE | Admit: 2007-05-23 | Discharge: 2007-05-23 | Payer: Self-pay | Admitting: Family Medicine

## 2007-05-23 ENCOUNTER — Encounter (INDEPENDENT_AMBULATORY_CARE_PROVIDER_SITE_OTHER): Payer: Self-pay | Admitting: Family Medicine

## 2007-05-23 LAB — CONVERTED CEMR LAB
ALT: 25 units/L (ref 0–35)
AST: 41 units/L — ABNORMAL HIGH (ref 0–37)
Albumin: 4.2 g/dL (ref 3.5–5.2)
Alkaline Phosphatase: 46 units/L (ref 39–117)
Potassium: 3.8 meq/L (ref 3.5–5.3)
Sodium: 139 meq/L (ref 135–145)
Total Protein: 8.4 g/dL — ABNORMAL HIGH (ref 6.0–8.3)

## 2007-05-26 ENCOUNTER — Ambulatory Visit: Payer: Self-pay | Admitting: Family Medicine

## 2007-05-26 LAB — CONVERTED CEMR LAB

## 2007-05-28 ENCOUNTER — Encounter (INDEPENDENT_AMBULATORY_CARE_PROVIDER_SITE_OTHER): Payer: Self-pay | Admitting: Family Medicine

## 2007-05-29 ENCOUNTER — Telehealth (INDEPENDENT_AMBULATORY_CARE_PROVIDER_SITE_OTHER): Payer: Self-pay | Admitting: Family Medicine

## 2007-06-04 ENCOUNTER — Ambulatory Visit: Payer: Self-pay | Admitting: Family Medicine

## 2007-06-04 ENCOUNTER — Encounter (INDEPENDENT_AMBULATORY_CARE_PROVIDER_SITE_OTHER): Payer: Self-pay | Admitting: Family Medicine

## 2007-06-04 LAB — CONVERTED CEMR LAB
ALT: 23 units/L (ref 0–35)
Albumin: 3.9 g/dL (ref 3.5–5.2)
Alkaline Phosphatase: 42 units/L (ref 39–117)
CO2: 24 meq/L (ref 19–32)
Glucose, Bld: 108 mg/dL — ABNORMAL HIGH (ref 70–99)
Potassium: 4.2 meq/L (ref 3.5–5.3)
Sodium: 138 meq/L (ref 135–145)
Total Protein: 7.7 g/dL (ref 6.0–8.3)

## 2007-06-05 ENCOUNTER — Encounter (INDEPENDENT_AMBULATORY_CARE_PROVIDER_SITE_OTHER): Payer: Self-pay | Admitting: Family Medicine

## 2007-06-06 ENCOUNTER — Telehealth (INDEPENDENT_AMBULATORY_CARE_PROVIDER_SITE_OTHER): Payer: Self-pay | Admitting: Family Medicine

## 2007-06-10 ENCOUNTER — Telehealth: Payer: Self-pay | Admitting: *Deleted

## 2007-06-10 ENCOUNTER — Ambulatory Visit: Payer: Self-pay | Admitting: Family Medicine

## 2007-06-16 ENCOUNTER — Ambulatory Visit: Payer: Self-pay | Admitting: Sports Medicine

## 2007-07-18 ENCOUNTER — Telehealth: Payer: Self-pay | Admitting: *Deleted

## 2007-07-21 ENCOUNTER — Ambulatory Visit: Payer: Self-pay | Admitting: Sports Medicine

## 2007-07-22 ENCOUNTER — Telehealth: Payer: Self-pay | Admitting: *Deleted

## 2007-07-22 ENCOUNTER — Ambulatory Visit: Payer: Self-pay | Admitting: Family Medicine

## 2007-07-30 ENCOUNTER — Ambulatory Visit: Payer: Self-pay | Admitting: Family Medicine

## 2007-08-04 ENCOUNTER — Ambulatory Visit: Payer: Self-pay | Admitting: Sports Medicine

## 2007-08-05 ENCOUNTER — Encounter: Admission: RE | Admit: 2007-08-05 | Discharge: 2007-08-05 | Payer: Self-pay | Admitting: Family Medicine

## 2007-08-06 ENCOUNTER — Ambulatory Visit: Payer: Self-pay | Admitting: Family Medicine

## 2007-08-07 ENCOUNTER — Telehealth: Payer: Self-pay | Admitting: *Deleted

## 2007-08-08 ENCOUNTER — Ambulatory Visit: Payer: Self-pay | Admitting: Family Medicine

## 2007-08-11 ENCOUNTER — Telehealth: Payer: Self-pay | Admitting: *Deleted

## 2007-08-12 ENCOUNTER — Ambulatory Visit: Payer: Self-pay | Admitting: Family Medicine

## 2007-08-14 ENCOUNTER — Telehealth (INDEPENDENT_AMBULATORY_CARE_PROVIDER_SITE_OTHER): Payer: Self-pay | Admitting: Family Medicine

## 2007-09-04 ENCOUNTER — Telehealth (INDEPENDENT_AMBULATORY_CARE_PROVIDER_SITE_OTHER): Payer: Self-pay | Admitting: Family Medicine

## 2007-10-03 ENCOUNTER — Ambulatory Visit: Payer: Self-pay | Admitting: Family Medicine

## 2007-10-03 DIAGNOSIS — M545 Low back pain: Secondary | ICD-10-CM

## 2007-10-30 ENCOUNTER — Telehealth (INDEPENDENT_AMBULATORY_CARE_PROVIDER_SITE_OTHER): Payer: Self-pay | Admitting: Family Medicine

## 2007-11-06 ENCOUNTER — Encounter (INDEPENDENT_AMBULATORY_CARE_PROVIDER_SITE_OTHER): Payer: Self-pay | Admitting: Family Medicine

## 2007-11-06 ENCOUNTER — Ambulatory Visit: Payer: Self-pay | Admitting: Family Medicine

## 2007-11-06 LAB — CONVERTED CEMR LAB
Basophils Absolute: 0.1 10*3/uL (ref 0.0–0.1)
Basophils Relative: 1 % (ref 0–1)
Calcium: 9.7 mg/dL (ref 8.4–10.5)
Eosinophils Absolute: 0.2 10*3/uL (ref 0.0–0.7)
Eosinophils Relative: 3 % (ref 0–5)
Lymphs Abs: 3.2 10*3/uL (ref 0.7–4.0)
MCV: 81.6 fL (ref 78.0–100.0)
Neutrophils Relative %: 45 % (ref 43–77)
Platelets: 273 10*3/uL (ref 150–400)
Potassium: 4.1 meq/L (ref 3.5–5.3)
RDW: 15.7 % — ABNORMAL HIGH (ref 11.5–15.5)
Sodium: 136 meq/L (ref 135–145)
WBC: 7.5 10*3/uL (ref 4.0–10.5)

## 2007-11-10 ENCOUNTER — Telehealth (INDEPENDENT_AMBULATORY_CARE_PROVIDER_SITE_OTHER): Payer: Self-pay | Admitting: *Deleted

## 2007-11-13 ENCOUNTER — Telehealth (INDEPENDENT_AMBULATORY_CARE_PROVIDER_SITE_OTHER): Payer: Self-pay | Admitting: Family Medicine

## 2007-11-18 ENCOUNTER — Telehealth (INDEPENDENT_AMBULATORY_CARE_PROVIDER_SITE_OTHER): Payer: Self-pay | Admitting: *Deleted

## 2007-11-18 ENCOUNTER — Ambulatory Visit (HOSPITAL_COMMUNITY): Admission: RE | Admit: 2007-11-18 | Discharge: 2007-11-18 | Payer: Self-pay | Admitting: Family Medicine

## 2007-11-21 ENCOUNTER — Ambulatory Visit: Payer: Self-pay | Admitting: Family Medicine

## 2007-12-04 ENCOUNTER — Telehealth: Payer: Self-pay | Admitting: *Deleted

## 2007-12-08 ENCOUNTER — Ambulatory Visit: Payer: Self-pay | Admitting: Family Medicine

## 2007-12-17 ENCOUNTER — Telehealth: Payer: Self-pay | Admitting: *Deleted

## 2007-12-22 ENCOUNTER — Telehealth (INDEPENDENT_AMBULATORY_CARE_PROVIDER_SITE_OTHER): Payer: Self-pay | Admitting: Family Medicine

## 2007-12-27 ENCOUNTER — Telehealth (INDEPENDENT_AMBULATORY_CARE_PROVIDER_SITE_OTHER): Payer: Self-pay | Admitting: Family Medicine

## 2007-12-30 ENCOUNTER — Ambulatory Visit: Payer: Self-pay | Admitting: Family Medicine

## 2008-01-05 ENCOUNTER — Encounter (INDEPENDENT_AMBULATORY_CARE_PROVIDER_SITE_OTHER): Payer: Self-pay | Admitting: Family Medicine

## 2008-01-05 ENCOUNTER — Telehealth (INDEPENDENT_AMBULATORY_CARE_PROVIDER_SITE_OTHER): Payer: Self-pay | Admitting: Family Medicine

## 2008-01-08 ENCOUNTER — Telehealth (INDEPENDENT_AMBULATORY_CARE_PROVIDER_SITE_OTHER): Payer: Self-pay | Admitting: Family Medicine

## 2008-01-09 ENCOUNTER — Telehealth (INDEPENDENT_AMBULATORY_CARE_PROVIDER_SITE_OTHER): Payer: Self-pay | Admitting: Family Medicine

## 2008-01-11 ENCOUNTER — Telehealth (INDEPENDENT_AMBULATORY_CARE_PROVIDER_SITE_OTHER): Payer: Self-pay | Admitting: Family Medicine

## 2008-01-12 ENCOUNTER — Encounter (INDEPENDENT_AMBULATORY_CARE_PROVIDER_SITE_OTHER): Payer: Self-pay | Admitting: Family Medicine

## 2008-01-12 ENCOUNTER — Ambulatory Visit: Payer: Self-pay | Admitting: Family Medicine

## 2008-01-12 LAB — CONVERTED CEMR LAB
CO2: 22 meq/L (ref 19–32)
Chloride: 102 meq/L (ref 96–112)
Glucose, Bld: 129 mg/dL — ABNORMAL HIGH (ref 70–99)
Potassium: 3.9 meq/L (ref 3.5–5.3)
Sodium: 138 meq/L (ref 135–145)

## 2008-01-14 ENCOUNTER — Telehealth (INDEPENDENT_AMBULATORY_CARE_PROVIDER_SITE_OTHER): Payer: Self-pay | Admitting: Family Medicine

## 2008-01-20 ENCOUNTER — Ambulatory Visit: Payer: Self-pay | Admitting: Family Medicine

## 2008-02-24 ENCOUNTER — Telehealth: Payer: Self-pay | Admitting: Family Medicine

## 2008-02-26 ENCOUNTER — Telehealth: Payer: Self-pay | Admitting: *Deleted

## 2008-03-03 ENCOUNTER — Ambulatory Visit: Payer: Self-pay | Admitting: Family Medicine

## 2008-03-03 DIAGNOSIS — E039 Hypothyroidism, unspecified: Secondary | ICD-10-CM

## 2008-03-04 ENCOUNTER — Ambulatory Visit: Payer: Self-pay | Admitting: Family Medicine

## 2008-03-04 ENCOUNTER — Encounter: Payer: Self-pay | Admitting: Family Medicine

## 2008-03-04 LAB — CONVERTED CEMR LAB
BUN: 13 mg/dL (ref 6–23)
CO2: 24 meq/L (ref 19–32)
Calcium: 9.5 mg/dL (ref 8.4–10.5)
Chloride: 104 meq/L (ref 96–112)
Cholesterol: 146 mg/dL (ref 0–200)
Creatinine, Ser: 1.06 mg/dL (ref 0.40–1.20)
Glucose, Bld: 100 mg/dL — ABNORMAL HIGH (ref 70–99)
HDL: 46 mg/dL (ref >39)
Hgb A1c MFr Bld: 5.9 %
LDL Cholesterol: 87 mg/dL (ref 0–99)
Potassium: 4.5 meq/L (ref 3.5–5.3)
Sodium: 139 meq/L (ref 135–145)
Total CHOL/HDL Ratio: 3.2
Triglycerides: 65 mg/dL (ref <150)
VLDL: 13 mg/dL (ref 0–40)

## 2008-03-13 ENCOUNTER — Telehealth: Payer: Self-pay | Admitting: Family Medicine

## 2008-03-17 ENCOUNTER — Telehealth: Payer: Self-pay | Admitting: *Deleted

## 2008-03-24 ENCOUNTER — Telehealth (INDEPENDENT_AMBULATORY_CARE_PROVIDER_SITE_OTHER): Payer: Self-pay | Admitting: *Deleted

## 2008-03-25 ENCOUNTER — Telehealth: Payer: Self-pay | Admitting: *Deleted

## 2008-03-26 ENCOUNTER — Encounter: Payer: Self-pay | Admitting: Family Medicine

## 2008-04-08 ENCOUNTER — Encounter: Payer: Self-pay | Admitting: *Deleted

## 2008-04-08 ENCOUNTER — Ambulatory Visit: Payer: Self-pay | Admitting: Family Medicine

## 2008-04-20 ENCOUNTER — Telehealth: Payer: Self-pay | Admitting: *Deleted

## 2008-04-23 ENCOUNTER — Ambulatory Visit: Payer: Self-pay | Admitting: Family Medicine

## 2008-04-27 ENCOUNTER — Telehealth: Payer: Self-pay | Admitting: Family Medicine

## 2008-04-27 DIAGNOSIS — E1149 Type 2 diabetes mellitus with other diabetic neurological complication: Secondary | ICD-10-CM

## 2008-05-07 ENCOUNTER — Telehealth: Payer: Self-pay | Admitting: Family Medicine

## 2008-05-29 ENCOUNTER — Telehealth: Payer: Self-pay | Admitting: Family Medicine

## 2008-06-01 ENCOUNTER — Telehealth: Payer: Self-pay | Admitting: Family Medicine

## 2008-06-24 ENCOUNTER — Telehealth: Payer: Self-pay | Admitting: *Deleted

## 2008-07-08 ENCOUNTER — Telehealth: Payer: Self-pay | Admitting: *Deleted

## 2008-07-22 ENCOUNTER — Telehealth: Payer: Self-pay | Admitting: Family Medicine

## 2008-07-26 ENCOUNTER — Ambulatory Visit: Payer: Self-pay | Admitting: Family Medicine

## 2008-08-04 ENCOUNTER — Telehealth: Payer: Self-pay | Admitting: Family Medicine

## 2008-08-08 ENCOUNTER — Telehealth (INDEPENDENT_AMBULATORY_CARE_PROVIDER_SITE_OTHER): Payer: Self-pay | Admitting: Family Medicine

## 2008-08-09 ENCOUNTER — Ambulatory Visit: Payer: Self-pay | Admitting: Family Medicine

## 2008-08-09 ENCOUNTER — Telehealth: Payer: Self-pay | Admitting: *Deleted

## 2008-08-12 ENCOUNTER — Ambulatory Visit: Payer: Self-pay | Admitting: Family Medicine

## 2008-08-12 ENCOUNTER — Telehealth: Payer: Self-pay | Admitting: Family Medicine

## 2008-08-25 ENCOUNTER — Telehealth (INDEPENDENT_AMBULATORY_CARE_PROVIDER_SITE_OTHER): Payer: Self-pay | Admitting: *Deleted

## 2008-09-02 ENCOUNTER — Ambulatory Visit: Payer: Self-pay | Admitting: Family Medicine

## 2008-09-03 ENCOUNTER — Telehealth: Payer: Self-pay | Admitting: *Deleted

## 2008-09-05 ENCOUNTER — Telehealth: Payer: Self-pay | Admitting: Family Medicine

## 2008-09-16 ENCOUNTER — Telehealth: Payer: Self-pay | Admitting: *Deleted

## 2008-09-21 ENCOUNTER — Telehealth: Payer: Self-pay | Admitting: Family Medicine

## 2008-09-23 ENCOUNTER — Telehealth: Payer: Self-pay | Admitting: Family Medicine

## 2008-10-09 ENCOUNTER — Telehealth: Payer: Self-pay | Admitting: Family Medicine

## 2008-10-11 ENCOUNTER — Ambulatory Visit: Payer: Self-pay | Admitting: Family Medicine

## 2008-10-21 ENCOUNTER — Telehealth: Payer: Self-pay | Admitting: Family Medicine

## 2008-10-22 ENCOUNTER — Telehealth: Payer: Self-pay | Admitting: *Deleted

## 2008-11-01 ENCOUNTER — Telehealth: Payer: Self-pay | Admitting: Family Medicine

## 2008-11-02 ENCOUNTER — Ambulatory Visit: Payer: Self-pay | Admitting: Family Medicine

## 2008-12-29 ENCOUNTER — Telehealth: Payer: Self-pay | Admitting: Family Medicine

## 2009-01-06 ENCOUNTER — Telehealth: Payer: Self-pay | Admitting: Family Medicine

## 2009-01-07 ENCOUNTER — Ambulatory Visit: Payer: Self-pay | Admitting: Family Medicine

## 2009-01-20 ENCOUNTER — Telehealth: Payer: Self-pay | Admitting: Family Medicine

## 2009-02-02 ENCOUNTER — Telehealth: Payer: Self-pay | Admitting: Family Medicine

## 2009-02-10 ENCOUNTER — Telehealth: Payer: Self-pay | Admitting: Family Medicine

## 2009-02-10 ENCOUNTER — Ambulatory Visit: Payer: Self-pay | Admitting: Family Medicine

## 2009-02-13 ENCOUNTER — Telehealth: Payer: Self-pay | Admitting: Family Medicine

## 2009-02-15 ENCOUNTER — Telehealth: Payer: Self-pay | Admitting: Family Medicine

## 2009-02-15 ENCOUNTER — Ambulatory Visit: Payer: Self-pay | Admitting: Family Medicine

## 2009-02-16 ENCOUNTER — Telehealth: Payer: Self-pay | Admitting: Family Medicine

## 2009-03-05 ENCOUNTER — Telehealth: Payer: Self-pay | Admitting: Family Medicine

## 2009-03-16 ENCOUNTER — Ambulatory Visit: Payer: Self-pay | Admitting: Family Medicine

## 2009-03-17 ENCOUNTER — Encounter: Payer: Self-pay | Admitting: Family Medicine

## 2009-03-17 ENCOUNTER — Ambulatory Visit: Payer: Self-pay | Admitting: Family Medicine

## 2009-03-17 LAB — CONVERTED CEMR LAB: Hgb A1c MFr Bld: 6 %

## 2009-03-18 LAB — CONVERTED CEMR LAB
ALT: 29 units/L (ref 0–35)
AST: 39 units/L — ABNORMAL HIGH (ref 0–37)
BUN: 11 mg/dL (ref 6–23)
CO2: 25 meq/L (ref 19–32)
Calcium: 9.6 mg/dL (ref 8.4–10.5)
Chloride: 101 meq/L (ref 96–112)
Cholesterol: 176 mg/dL (ref 0–200)
Creatinine, Ser: 0.88 mg/dL (ref 0.40–1.20)
HDL: 42 mg/dL (ref >39)
Total Bilirubin: 0.6 mg/dL (ref 0.3–1.2)
Total CHOL/HDL Ratio: 4.2
VLDL: 18 mg/dL (ref 0–40)

## 2009-03-23 ENCOUNTER — Telehealth: Payer: Self-pay | Admitting: Family Medicine

## 2009-04-07 ENCOUNTER — Ambulatory Visit: Payer: Self-pay | Admitting: Family Medicine

## 2009-04-07 DIAGNOSIS — M1990 Unspecified osteoarthritis, unspecified site: Secondary | ICD-10-CM

## 2009-04-16 ENCOUNTER — Telehealth: Payer: Self-pay | Admitting: Family Medicine

## 2009-04-18 ENCOUNTER — Ambulatory Visit: Payer: Self-pay | Admitting: Family Medicine

## 2009-04-19 ENCOUNTER — Telehealth: Payer: Self-pay | Admitting: Family Medicine

## 2009-04-20 ENCOUNTER — Telehealth: Payer: Self-pay | Admitting: Family Medicine

## 2009-04-25 ENCOUNTER — Telehealth: Payer: Self-pay | Admitting: Family Medicine

## 2009-04-25 ENCOUNTER — Ambulatory Visit: Payer: Self-pay | Admitting: Family Medicine

## 2009-04-27 ENCOUNTER — Telehealth: Payer: Self-pay | Admitting: Family Medicine

## 2009-04-28 ENCOUNTER — Ambulatory Visit: Payer: Self-pay | Admitting: Family Medicine

## 2009-04-29 ENCOUNTER — Telehealth: Payer: Self-pay | Admitting: *Deleted

## 2009-05-02 ENCOUNTER — Telehealth: Payer: Self-pay | Admitting: *Deleted

## 2009-05-09 ENCOUNTER — Ambulatory Visit: Payer: Self-pay | Admitting: Family Medicine

## 2009-05-09 ENCOUNTER — Encounter: Payer: Self-pay | Admitting: Family Medicine

## 2009-05-11 ENCOUNTER — Telehealth: Payer: Self-pay | Admitting: *Deleted

## 2009-05-12 ENCOUNTER — Telehealth: Payer: Self-pay | Admitting: Family Medicine

## 2009-05-25 ENCOUNTER — Telehealth: Payer: Self-pay | Admitting: Family Medicine

## 2009-06-07 ENCOUNTER — Telehealth (INDEPENDENT_AMBULATORY_CARE_PROVIDER_SITE_OTHER): Payer: Self-pay | Admitting: *Deleted

## 2009-06-08 ENCOUNTER — Ambulatory Visit: Payer: Self-pay | Admitting: Family Medicine

## 2009-06-08 ENCOUNTER — Encounter (INDEPENDENT_AMBULATORY_CARE_PROVIDER_SITE_OTHER): Payer: Self-pay | Admitting: *Deleted

## 2009-06-11 ENCOUNTER — Telehealth: Payer: Self-pay | Admitting: Family Medicine

## 2009-06-14 ENCOUNTER — Telehealth: Payer: Self-pay | Admitting: Family Medicine

## 2009-06-15 ENCOUNTER — Ambulatory Visit: Payer: Self-pay | Admitting: Family Medicine

## 2009-06-20 ENCOUNTER — Telehealth: Payer: Self-pay | Admitting: Family Medicine

## 2009-06-20 ENCOUNTER — Ambulatory Visit: Payer: Self-pay | Admitting: Family Medicine

## 2009-06-22 ENCOUNTER — Telehealth: Payer: Self-pay | Admitting: Family Medicine

## 2009-06-22 ENCOUNTER — Ambulatory Visit (HOSPITAL_COMMUNITY): Admission: RE | Admit: 2009-06-22 | Discharge: 2009-06-22 | Payer: Self-pay | Admitting: Family Medicine

## 2009-07-21 ENCOUNTER — Telehealth: Payer: Self-pay | Admitting: *Deleted

## 2009-07-21 ENCOUNTER — Telehealth: Payer: Self-pay | Admitting: Family Medicine

## 2009-07-26 ENCOUNTER — Ambulatory Visit: Payer: Self-pay | Admitting: Family Medicine

## 2009-07-26 ENCOUNTER — Ambulatory Visit (HOSPITAL_COMMUNITY): Admission: RE | Admit: 2009-07-26 | Discharge: 2009-07-26 | Payer: Self-pay | Admitting: Family Medicine

## 2009-07-26 ENCOUNTER — Telehealth: Payer: Self-pay | Admitting: Family Medicine

## 2009-08-20 ENCOUNTER — Telehealth: Payer: Self-pay | Admitting: Family Medicine

## 2009-08-22 ENCOUNTER — Ambulatory Visit: Payer: Self-pay | Admitting: Family Medicine

## 2009-08-22 ENCOUNTER — Telehealth: Payer: Self-pay | Admitting: Family Medicine

## 2009-08-30 ENCOUNTER — Telehealth: Payer: Self-pay | Admitting: Family Medicine

## 2009-08-31 ENCOUNTER — Telehealth: Payer: Self-pay | Admitting: Family Medicine

## 2009-09-06 ENCOUNTER — Encounter: Payer: Self-pay | Admitting: Family Medicine

## 2009-09-09 ENCOUNTER — Telehealth: Payer: Self-pay | Admitting: Family Medicine

## 2009-09-27 ENCOUNTER — Ambulatory Visit: Payer: Self-pay | Admitting: Family Medicine

## 2009-09-27 ENCOUNTER — Telehealth: Payer: Self-pay | Admitting: Family Medicine

## 2009-09-28 ENCOUNTER — Telehealth: Payer: Self-pay | Admitting: Family Medicine

## 2009-09-28 ENCOUNTER — Ambulatory Visit (HOSPITAL_COMMUNITY): Admission: RE | Admit: 2009-09-28 | Discharge: 2009-09-28 | Payer: Self-pay | Admitting: Family Medicine

## 2009-10-03 ENCOUNTER — Ambulatory Visit: Payer: Self-pay | Admitting: Family Medicine

## 2009-10-17 ENCOUNTER — Telehealth: Payer: Self-pay | Admitting: Family Medicine

## 2009-10-18 ENCOUNTER — Telehealth: Payer: Self-pay | Admitting: Family Medicine

## 2009-11-15 ENCOUNTER — Telehealth: Payer: Self-pay | Admitting: Family Medicine

## 2009-11-23 ENCOUNTER — Ambulatory Visit: Payer: Self-pay | Admitting: Family Medicine

## 2009-11-23 ENCOUNTER — Encounter: Payer: Self-pay | Admitting: Family Medicine

## 2009-11-24 LAB — CONVERTED CEMR LAB
Albumin: 4.2 g/dL (ref 3.5–5.2)
BUN: 13 mg/dL (ref 6–23)
Calcium: 9.9 mg/dL (ref 8.4–10.5)
Chloride: 101 meq/L (ref 96–112)
Creatinine, Ser: 0.8 mg/dL (ref 0.40–1.20)
Direct LDL: 75 mg/dL
Glucose, Bld: 88 mg/dL (ref 70–99)
Potassium: 4.3 meq/L (ref 3.5–5.3)

## 2009-12-12 ENCOUNTER — Ambulatory Visit: Payer: Self-pay | Admitting: Family Medicine

## 2009-12-23 ENCOUNTER — Ambulatory Visit: Payer: Self-pay | Admitting: Family Medicine

## 2009-12-26 ENCOUNTER — Ambulatory Visit: Payer: Self-pay | Admitting: Family Medicine

## 2009-12-27 ENCOUNTER — Encounter: Payer: Self-pay | Admitting: Family Medicine

## 2010-01-02 ENCOUNTER — Telehealth: Payer: Self-pay | Admitting: Family Medicine

## 2010-01-26 ENCOUNTER — Encounter: Payer: Self-pay | Admitting: Family Medicine

## 2010-01-29 ENCOUNTER — Ambulatory Visit: Payer: Self-pay | Admitting: Family Medicine

## 2010-01-29 ENCOUNTER — Encounter: Payer: Self-pay | Admitting: Family Medicine

## 2010-01-29 ENCOUNTER — Telehealth: Payer: Self-pay | Admitting: Family Medicine

## 2010-01-29 ENCOUNTER — Observation Stay (HOSPITAL_COMMUNITY): Admission: EM | Admit: 2010-01-29 | Discharge: 2010-01-30 | Payer: Self-pay | Admitting: Emergency Medicine

## 2010-02-01 ENCOUNTER — Ambulatory Visit: Payer: Self-pay | Admitting: Family Medicine

## 2010-02-08 ENCOUNTER — Ambulatory Visit: Payer: Self-pay | Admitting: Family Medicine

## 2010-02-13 ENCOUNTER — Telehealth: Payer: Self-pay | Admitting: Family Medicine

## 2010-02-22 ENCOUNTER — Ambulatory Visit: Payer: Self-pay | Admitting: Family Medicine

## 2010-02-22 LAB — CONVERTED CEMR LAB: Hgb A1c MFr Bld: 6 %

## 2010-03-08 ENCOUNTER — Ambulatory Visit: Payer: Self-pay | Admitting: Family Medicine

## 2010-03-08 ENCOUNTER — Encounter: Payer: Self-pay | Admitting: Family Medicine

## 2010-03-21 ENCOUNTER — Ambulatory Visit: Payer: Self-pay | Admitting: Family Medicine

## 2010-04-05 ENCOUNTER — Telehealth: Payer: Self-pay | Admitting: Family Medicine

## 2010-04-05 ENCOUNTER — Ambulatory Visit: Payer: Self-pay | Admitting: Family Medicine

## 2010-04-05 LAB — CONVERTED CEMR LAB
Bilirubin Urine: NEGATIVE
Glucose, Urine, Semiquant: NEGATIVE
Protein, U semiquant: NEGATIVE
Specific Gravity, Urine: 1.02
Whiff Test: NEGATIVE
pH: 5.5

## 2010-04-09 ENCOUNTER — Telehealth: Payer: Self-pay | Admitting: Family Medicine

## 2010-04-12 ENCOUNTER — Telehealth: Payer: Self-pay | Admitting: Family Medicine

## 2010-04-20 ENCOUNTER — Emergency Department (HOSPITAL_COMMUNITY): Admission: EM | Admit: 2010-04-20 | Discharge: 2010-04-21 | Payer: Self-pay | Admitting: Emergency Medicine

## 2010-04-20 ENCOUNTER — Telehealth: Payer: Self-pay | Admitting: *Deleted

## 2010-04-25 ENCOUNTER — Ambulatory Visit: Payer: Self-pay | Admitting: Family Medicine

## 2010-04-30 ENCOUNTER — Telehealth: Payer: Self-pay | Admitting: Family Medicine

## 2010-05-01 ENCOUNTER — Emergency Department (HOSPITAL_COMMUNITY): Admission: EM | Admit: 2010-05-01 | Discharge: 2010-05-01 | Payer: Self-pay | Admitting: Emergency Medicine

## 2010-05-02 ENCOUNTER — Encounter: Payer: Self-pay | Admitting: *Deleted

## 2010-05-02 ENCOUNTER — Ambulatory Visit: Payer: Self-pay | Admitting: Family Medicine

## 2010-05-03 ENCOUNTER — Encounter: Payer: Self-pay | Admitting: *Deleted

## 2010-05-12 ENCOUNTER — Ambulatory Visit: Payer: Self-pay | Admitting: Family Medicine

## 2010-05-12 DIAGNOSIS — IMO0002 Reserved for concepts with insufficient information to code with codable children: Secondary | ICD-10-CM | POA: Insufficient documentation

## 2010-06-01 ENCOUNTER — Encounter: Payer: Self-pay | Admitting: Family Medicine

## 2010-06-12 ENCOUNTER — Encounter: Payer: Self-pay | Admitting: Family Medicine

## 2010-06-21 ENCOUNTER — Telehealth: Payer: Self-pay | Admitting: Family Medicine

## 2010-06-22 ENCOUNTER — Ambulatory Visit: Payer: Self-pay | Admitting: Family Medicine

## 2010-06-27 ENCOUNTER — Telehealth: Payer: Self-pay | Admitting: *Deleted

## 2010-06-28 ENCOUNTER — Telehealth: Payer: Self-pay | Admitting: Family Medicine

## 2010-07-31 ENCOUNTER — Encounter: Payer: Self-pay | Admitting: Family Medicine

## 2010-08-12 ENCOUNTER — Encounter: Payer: Self-pay | Admitting: Family Medicine

## 2010-08-13 ENCOUNTER — Encounter: Payer: Self-pay | Admitting: Family Medicine

## 2010-08-14 ENCOUNTER — Encounter: Payer: Self-pay | Admitting: Family Medicine

## 2010-08-15 ENCOUNTER — Ambulatory Visit
Admission: RE | Admit: 2010-08-15 | Discharge: 2010-08-15 | Payer: Self-pay | Source: Home / Self Care | Attending: Family Medicine | Admitting: Family Medicine

## 2010-08-15 ENCOUNTER — Encounter: Payer: Self-pay | Admitting: Family Medicine
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
## 2010-08-22 NOTE — Progress Notes (Signed)
Summary: triage  Phone Note Call from Patient Call back at [phone]   Caller: Patient Summary of Call: feels like she has a knife in her back and meds are not helping  Initial call taken by: De Nurse,  September 27, 2009 12:24 PM  Follow-up for Phone Call        states the tramadol & flexeril are not helping. did rake leaves 5 days ago. pain got worse. appt at 3 in work in. aware of wait. suggested she leave the raking to someone else. Follow-up by: Golden Circle RN,  September 27, 2009 1:31 PM  Additional Follow-up for Phone Call Additional follow up Details #1::        pt to see Dr. Denyse Amass. Additional Follow-up by: Marisue Ivan  MD,  September 28, 2009 3:51 PM

## 2010-08-22 NOTE — Miscellaneous (Signed)
Summary: Pain Contract  Pain Contract   Imported By: De Nurse 11/28/2009 09:40:58  _____________________________________________________________________  External Attachment:    Type:   Image     Comment:   External Document

## 2010-08-22 NOTE — Miscellaneous (Signed)
Summary: [facility] nutrition center  Clinical Lists Changes she did not kep her appt on 03/07/10 at the [facility].Golden Circle RN  March 08, 2010 10:24 AM  Noted. Please ask the patient why she did not show up and if she would like it to be rescheduled. She is doing very well and has a low A1c so if she can not make it I am ok with that for now. Thanks. Jamie Brookes MD  March 08, 2010 2:52 PM   Appended Document: [facility] nutrition center lm asking her to call back. will discuss the dnka when she calls  Appended Document: [facility] nutrition center states she did not even remember she had the appt . very stressed now. will reschedule later

## 2010-08-22 NOTE — Assessment & Plan Note (Signed)
Summary: METATASALGA/KH   Vital Signs:  Patient profile:   55 year old female BP sitting:   107 / 67  Vitals Entered By: Lillia Pauls CMA (December 23, 2009 11:00 AM)  Primary Care Provider:  Marisue Ivan  MD   History of Present Illness: Pt presents with bilateral foot pain that she has had for about 17 days. The pain is typically is located over her arches and on the top of her feet down towards her toes. Her left foot hurts more than her right. She is aware that her left foot tends to point laterally when she walks. The pain is present when she walks but improves with rest. Denies any pain while at rest. She has changed her gait because of the pain which is getting progressively worse. No recent injuries. Left foot has a history of a previous ankle fracture, torn tendons and a stress fracture per her report. No prior injuries to her right foot or ankle.  She has tried tramadol which only dulls her pain.   Allergies: 1)  ! Hydrocodone-Acetaminophen (Hydrocodone-Acetaminophen) 2)  Tetracycline  Physical Exam  General:  alert and well-developed.   Head:  normocephalic and atraumatic.   Ears:  Normal hearing Mouth:  MMM Neck:  supple.   Lungs:  normal respiratory effort.   Msk:  Left Foot and Ankle: Pes cavus Wide forefoot No other obvious bony abnormalities, edema or bruising Full ROM of her ankle and big toe Mild TTP over her distal 2-4th MTs 5/5 strength with resisted ankle ROM  Right Foot and Ankle: Pes cavus and wide forefoot as well No other obvious bony abnormalities, edema or bruising Full ROM of her ankle and big toe No obvious TTP 5/5 strength with resisted ankle ROM testing  Gait: Antalgic gait favoring her right foot Midfoot collapse bilaterally Pronates with her left foot   Impression & Recommendations:  Problem # 1:  FOOT PAIN (ICD-729.5) Assessment New  Due to a combination of her collapsing long arch and metartarsalgia 1. Custom orthotics were  made for her today which were comfortable for her prior to leaving the office  Patient was fitted for a : standard, cushioned, semi-rigid orthotic. The orthotic was heated and afterward the patient stood on the orthotic blank positioned on the orthotic stand. The patient was positioned in subtalar neutral position and 10 degrees of ankle dorsiflexion in a weight bearing stance. After completion of molding, a stable base was applied to the orthotic blank. The blank was ground to a stable position for weight bearing. Size: 9 red cushioned Base: Medicume blue EVA Posting: Good arch supports Additional orthotic padding: None  Orders: Orthotic Materials, each unit (Y7829)  Problem # 2:  TALIPES CAVUS (ICD-754.71) Assessment: New  High arches are beginning to collapse causing foot pain. See above for treatment plan.   Orders: Orthotic Materials, each unit [phone])  Problem # 3:  METATARSALGIA (ICD-726.70) Assessment: New Distal 2-4 MT pain however not as much on the plantar surface  Problem # 4:  GAIT DISTURBANCE (ICD-781.2) Assessment: New  Significant antalgic gait 1. Improved midfoot control and less pain with custom orthotics  Orders: Orthotic Materials, each unit (L3002)  Complete Medication List: 1)  Allegra 180 Mg Tabs (Fexofenadine hcl) .... Take 1 tablet by mouth once a day 2)  Fluticasone Propionate 50 Mcg/act Susp (Fluticasone propionate) .... Spray 1 spray into both nostrils twice a day 3)  Lisinopril-hydrochlorothiazide 10-12.5 Mg Tabs (Lisinopril-hydrochlorothiazide) .... One tablet by mouth daily for high  blood pressure 4)  Levothyroxine Sodium 112 Mcg Tabs (Levothyroxine sodium) .... Take 1 tablet by mouth once a day for hypothyroid 5)  Simvastatin 40 Mg Tabs (Simvastatin) .... One tablet by mouth at bedtime for cholesterol 6)  Metformin Hcl 850 Mg Tabs (Metformin hcl) .... One tablet daily. 7)  Proair Hfa 108 (90 Base) Mcg/act Aers (Albuterol sulfate) .... 2 puffs  q4h as needed for wheezing 8)  Flexeril 10 Mg Tabs (Cyclobenzaprine hcl) .... Two times a day prn 9)  Tylenol Extra Strength 500 Mg Tabs (Acetaminophen) .Marland Kitchen.. 1-2 tablets by mouth every 8 hours as needed for arthritis 10)  Autolet Lancing Device Misc (Lancet devices) .... Check blood glucose levels 1-3 times a day 11)  Bayer Contour Test Strp (Glucose blood) .... Use to check blood glucose levels 1-3 times a day 12)  Tramadol Hcl 50 Mg Tabs (Tramadol hcl) .... One tablet by mouth two times a day 13)  Aspirin Ec 81 Mg Tbec (Aspirin) .Marland Kitchen.. 1 tablet by mouth daily

## 2010-08-22 NOTE — Assessment & Plan Note (Signed)
Summary: viral URI   Vital Signs:  Patient profile:   55 year old female Height:      71 inches Weight:      304 pounds BMI:     42.55 O2 Sat:      96 % on Room air Temp:     98.5 degrees F oral Pulse rate:   82 / minute BP sitting:   96 / 65  (left arm) Cuff size:   large  Vitals Entered By: Gladstone Pih (August 22, 2009 11:20 AM)  O2 Flow:  Room air CC: C/O cough,fever,congestion Is Patient Diabetic? Yes Did you bring your meter with you today? No Pain Assessment Patient in pain? no        Primary Care Provider:  Marisue Ivan  MD  CC:  C/O cough, fever, and congestion.  History of Present Illness: 55yo F with flu-like symptoms  Flu-like symptoms: Started 3 days ago with fever (Tm "107", orally), cough (productive), rhinorrhea, and sore throat.  No N/V, abd pain, or diarrhea.  Currently taking tylenol and allegra, but reports that she has taken all types of cough suppressants without much relief.  Still able to drink fluids. Son has been sick with similar symptoms over the past few weeks.     Habits & Providers  Alcohol-Tobacco-Diet     Tobacco Status: never  Current Medications (verified): 1)  Allegra 180 Mg Tabs (Fexofenadine Hcl) .... Take 1 Tablet By Mouth Once A Day 2)  Fluticasone Propionate 50 Mcg/act Susp (Fluticasone Propionate) .... Spray 1 Spray Into Both Nostrils Twice A Day 3)  Lisinopril-Hydrochlorothiazide 10-12.5 Mg  Tabs (Lisinopril-Hydrochlorothiazide) .... One Tablet By Mouth Daily For High Blood Pressure 4)  Levothyroxine Sodium 112 Mcg Tabs (Levothyroxine Sodium) .... Take 1 Tablet By Mouth Once A Day For Hypothyroid 5)  Simvastatin 40 Mg Tabs (Simvastatin) .... One Tablet By Mouth At Bedtime For Cholesterol 6)  Metformin Hcl 850 Mg  Tabs (Metformin Hcl) .... One Tablet Daily. 7)  Ventolin Hfa 108 (90 Base) Mcg/act  Aers (Albuterol Sulfate) .... 2 - 4 Puffs Every Four Hours As Needed For Wheezing 8)  Flexeril 10 Mg  Tabs (Cyclobenzaprine  Hcl) .... Two Times A Day Prn 9)  Tylenol Extra Strength 500 Mg Tabs (Acetaminophen) .Marland Kitchen.. 1-2 Tablets By Mouth Every 8 Hours As Needed For Arthritis 10)  Autolet Lancing Device  Misc (Lancet Devices) .... Check Blood Glucose Levels 1-3 Times A Day 11)  Bayer Contour Test  Strp (Glucose Blood) .... Use To Check Blood Glucose Levels 1-3 Times A Day 12)  Tramadol Hcl 50 Mg Tabs (Tramadol Hcl) .... One Tablet By Mouth Three Times A Day As Needed For Pain 13)  Aspirin Ec 81 Mg Tbec (Aspirin) .Marland Kitchen.. 1 Tablet By Mouth Daily  Allergies (verified): 1)  Tetracycline  Review of Systems       No N/V, abd pain, or diarrhea.  Physical Exam  General:  VS reviewed.  Obese, dry productive cough, NAD. Head:  no sinus ttp  Eyes:  no injected conjunctiva Nose:  no nasal discharge Mouth:  moist mucus membranes Neck:  supple, full ROM, no goiter or mass  Lungs:  Normal respiratory effort, chest expands symmetrically. Lungs are clear to auscultation, no crackles or wheezes. Heart:  Normal rate and regular rhythm. S1 and S2 normal without gallop, murmur, click, rub or other extra sounds. Skin:  nl color and turgor Cervical Nodes:  no LAD   Impression & Recommendations:  Problem # 1:  URI (ICD-465.9) Assessment New Hx and exam c/w acute URI.  Son had similar symptoms in previous days.  O2 sat 96% on RA and lung exam clear.  No reason for imaging.  Will provide supportive care at this time.  Tessalon perles for the cough.  She is to f/u if symptoms worsen or if she has a fever as reported before that can be documented objectively in our clinic.    Her updated medication list for this problem includes:    Allegra 180 Mg Tabs (Fexofenadine hcl) .Marland Kitchen... Take 1 tablet by mouth once a day    Tylenol Extra Strength 500 Mg Tabs (Acetaminophen) .Marland Kitchen... 1-2 tablets by mouth every 8 hours as needed for arthritis    Aspirin Ec 81 Mg Tbec (Aspirin) .Marland Kitchen... 1 tablet by mouth daily    Tessalon 200 Mg Caps (Benzonatate)  .Marland Kitchen... 1 tab by mouth three times a day as needed for cough  Orders: [facility]- Est Level  3 (99213)  Complete Medication List: 1)  Allegra 180 Mg Tabs (Fexofenadine hcl) .... Take 1 tablet by mouth once a day 2)  Fluticasone Propionate 50 Mcg/act Susp (Fluticasone propionate) .... Spray 1 spray into both nostrils twice a day 3)  Lisinopril-hydrochlorothiazide 10-12.5 Mg Tabs (Lisinopril-hydrochlorothiazide) .... One tablet by mouth daily for high blood pressure 4)  Levothyroxine Sodium 112 Mcg Tabs (Levothyroxine sodium) .... Take 1 tablet by mouth once a day for hypothyroid 5)  Simvastatin 40 Mg Tabs (Simvastatin) .... One tablet by mouth at bedtime for cholesterol 6)  Metformin Hcl 850 Mg Tabs (Metformin hcl) .... One tablet daily. 7)  Ventolin Hfa 108 (90 Base) Mcg/act Aers (Albuterol sulfate) .... 2 - 4 puffs every four hours as needed for wheezing 8)  Flexeril 10 Mg Tabs (Cyclobenzaprine hcl) .... Two times a day prn 9)  Tylenol Extra Strength 500 Mg Tabs (Acetaminophen) .Marland Kitchen.. 1-2 tablets by mouth every 8 hours as needed for arthritis 10)  Autolet Lancing Device Misc (Lancet devices) .... Check blood glucose levels 1-3 times a day 11)  Bayer Contour Test Strp (Glucose blood) .... Use to check blood glucose levels 1-3 times a day 12)  Tramadol Hcl 50 Mg Tabs (Tramadol hcl) .... One tablet by mouth three times a day as needed for pain 13)  Aspirin Ec 81 Mg Tbec (Aspirin) .Marland Kitchen.. 1 tablet by mouth daily 14)  Tessalon 200 Mg Caps (Benzonatate) .Marland Kitchen.. 1 tab by mouth three times a day as needed for cough  Other Orders: Pulse Oximetry- [facility] (44010)  Patient Instructions: 1)  Please schedule a follow-up appointment as needed at the end of this week if your symptoms don't improve. 2)  I don't think this is a pneumonia. 3)  Take the cough medication as prescribed.   Prescriptions: TESSALON 200 MG CAPS (BENZONATATE) 1 tab by mouth three times a day as needed for cough  #21 x 0   Entered and Authorized by:    Marisue Ivan  MD   Signed by:   Marisue Ivan  MD on 08/22/2009   Method used:   Electronically to        [facility]  [phone]* (retail)       [address]       [place], [place]  [address]       Ph: [phone] or [phone]       Fax: [phone]   RxID:   [id number]

## 2010-08-22 NOTE — Progress Notes (Signed)
Summary: triage  Phone Note Call from Patient Call back at Home Phone [phone]   Caller: Patient Summary of Call: Pt really has questions about what the deference in Dementia and [place]. Initial call taken by: Clydell Hakim,  October 18, 2009 12:03 PM  Follow-up for Phone Call        her mom (84) was put on aricept. they suggested Hospice for her.  also sundowns frequently. she has PNA. the have stopped all meds.  I described the continum of dementia & altzheimer. Had a family meeting but did not decide things yet. having another meeting. suggested she & other family search internet for info about this. she does have a computer.  siblings are making waves over her care. they have not been in contact with parent for 25 yrs. Brianna Wheeler is crying & very stressed. suggested she ask for a Child psychotherapist from [place] or Hospice to ask more questions & get answers. Brianna Wheeler does not think her mom has long to live she agreed with plan Follow-up by: Golden Circle RN,  October 18, 2009 12:17 PM

## 2010-08-22 NOTE — Miscellaneous (Signed)
Summary: Patient Summary  Clinical Lists Changes  Problems: Removed problem of ANXIETY STATE, UNSPECIFIED (ICD-300.00) Assessed PREVENTIVE HEALTH CARE as comment only - Pt is due for colonoscopy, has deferred in previous visits. Last mammogram 06/2009- normal. Denied the pap smear at previous visits. Receives annual flu shot.   Advised on Calcium and Vitamin D supplementation.   Counseled on weight loss strategies.      Assessed OSTEOARTHRITIS as comment only - Currently on scheduled tylenol 500mg  (1-2 tabs q8).  May help with symptoms, but she needs to lose weight to have a true impact. She is on chronic ultram more for chronic back pain and she is on a pain contract.   Assessed HYPOTHYROIDISM as comment only - Stable  Due for next TSH 02/2010  Her updated medication list for this problem includes:    Levothyroxine Sodium 112 Mcg Tabs (Levothyroxine sodium) .Marland Kitchen... Take 1 tablet by mouth once a day for hypothyroid  Assessed BACK PAIN, LUMBAR, WITH RADICULOPATHY as comment only - Pt w/ chronic pain that is well controlled with tylenol and tramadol. After in depth discussion, I am confident that she is not dependent upon the medication but helps her to better function physically. Pain contract with Tramadol implemented and scanned. Wll provide standing Tramadol #30 each month.  Her updated medication list for this problem includes:    Flexeril 10 Mg Tabs (Cyclobenzaprine hcl) .Marland Kitchen..Marland Kitchen Two times a day prn    Tylenol Extra Strength 500 Mg Tabs (Acetaminophen) .Marland Kitchen... 1-2 tablets by mouth every 8 hours as needed for arthritis    Tramadol Hcl 50 Mg Tabs (Tramadol hcl) ..... One tablet by mouth three times a day as needed for pain    Aspirin Ec 81 Mg Tbec (Aspirin) .Marland Kitchen... 1 tablet by mouth daily  Assessed DIABETES MELLITUS, TYPE II, CONTROLLED as comment only - At goal- last A1c 6 (11/2009)  Her updated medication list for this problem includes:    Lisinopril-hydrochlorothiazide 10-12.5 Mg  Tabs (Lisinopril-hydrochlorothiazide) ..... One tablet by mouth daily for high blood pressure    Metformin Hcl 850 Mg Tabs (Metformin hcl) ..... One tablet daily.    Aspirin Ec 81 Mg Tbec (Aspirin) .Marland Kitchen... 1 tablet by mouth daily  Assessed OBESITY, MORBID as comment only - BMI 43 Her greatest co-morbidity and most difficult to manage.  Assessed HYPERTENSION, BENIGN SYSTEMIC as comment only - At goal (<140/90).  Her updated medication list for this problem includes:    Lisinopril-hydrochlorothiazide 10-12.5 Mg Tabs (Lisinopril-hydrochlorothiazide) ..... One tablet by mouth daily for high blood pressure    Assessed HYPERLIPIDEMIA as comment only - At goal (<100) LDL 75 (11/2009)  Her updated medication list for this problem includes:    Simvastatin 40 Mg Tabs (Simvastatin) ..... One tablet by mouth at bedtime for cholesterol  Observations: Added new observation of PRIMARY MD: Marisue Ivan  MD (12/27/2009 13:51)        Impression & Recommendations:  Problem # 1:  PREVENTIVE HEALTH CARE (ICD-V70.0) Assessment Comment Only Pt is due for colonoscopy, has deferred in previous visits. Last mammogram 06/2009- normal. Denied the pap smear at previous visits. Receives annual flu shot.   Advised on Calcium and Vitamin D supplementation.   Counseled on weight loss strategies.    Problem # 2:  OSTEOARTHRITIS (ICD-715.90) Assessment: Comment Only Currently on scheduled tylenol 500mg  (1-2 tabs q8).  May help with symptoms, but she needs to lose weight to have a true impact. She is on chronic ultram more for chronic back pain  and she is on a pain contract.  Problem # 3:  HYPOTHYROIDISM (ICD-244.9) Assessment: Comment Only Stable  Due for next TSH 02/2010  Her updated medication list for this problem includes:    Levothyroxine Sodium 112 Mcg Tabs (Levothyroxine sodium) .Marland Kitchen... Take 1 tablet by mouth once a day for hypothyroid  Problem # 4:  BACK PAIN, LUMBAR, WITH RADICULOPATHY  (ICD-724.4) Assessment: Comment Only Pt w/ chronic pain that is well controlled with tylenol and tramadol. After in depth discussion, I am confident that she is not dependent upon the medication but helps her to better function physically. Pain contract with Tramadol implemented and scanned. Wll provide standing Tramadol #30 each month.  Her updated medication list for this problem includes:    Flexeril 10 Mg Tabs (Cyclobenzaprine hcl) .Marland Kitchen..Marland Kitchen Two times a day prn    Tylenol Extra Strength 500 Mg Tabs (Acetaminophen) .Marland Kitchen... 1-2 tablets by mouth every 8 hours as needed for arthritis    Tramadol Hcl 50 Mg Tabs (Tramadol hcl) ..... One tablet by mouth three times a day as needed for pain    Aspirin Ec 81 Mg Tbec (Aspirin) .Marland Kitchen... 1 tablet by mouth daily  Problem # 5:  DIABETES MELLITUS, TYPE II, CONTROLLED (ICD-250.00) Assessment: Comment Only At goal- last A1c 6 (11/2009)  Her updated medication list for this problem includes:    Lisinopril-hydrochlorothiazide 10-12.5 Mg Tabs (Lisinopril-hydrochlorothiazide) ..... One tablet by mouth daily for high blood pressure    Metformin Hcl 850 Mg Tabs (Metformin hcl) ..... One tablet daily.    Aspirin Ec 81 Mg Tbec (Aspirin) .Marland Kitchen... 1 tablet by mouth daily  Problem # 6:  OBESITY, MORBID (ICD-278.01) Assessment: Comment Only BMI 43 Her greatest co-morbidity and most difficult to manage.  Problem # 7:  HYPERTENSION, BENIGN SYSTEMIC (ICD-401.1) Assessment: Comment Only At goal (<140/90).  Her updated medication list for this problem includes:    Lisinopril-hydrochlorothiazide 10-12.5 Mg Tabs (Lisinopril-hydrochlorothiazide) ..... One tablet by mouth daily for high blood pressure  Problem # 8:  HYPERLIPIDEMIA (ICD-272.4) At goal (<100) LDL 75 (11/2009)  Her updated medication list for this problem includes:    Simvastatin 40 Mg Tabs (Simvastatin) ..... One tablet by mouth at bedtime for cholesterol  Complete Medication List: 1)  Allegra 180 Mg Tabs  (Fexofenadine hcl) .... Take 1 tablet by mouth once a day 2)  Fluticasone Propionate 50 Mcg/act Susp (Fluticasone propionate) .... Spray 1 spray into both nostrils twice a day 3)  Lisinopril-hydrochlorothiazide 10-12.5 Mg Tabs (Lisinopril-hydrochlorothiazide) .... One tablet by mouth daily for high blood pressure 4)  Levothyroxine Sodium 112 Mcg Tabs (Levothyroxine sodium) .... Take 1 tablet by mouth once a day for hypothyroid 5)  Simvastatin 40 Mg Tabs (Simvastatin) .... One tablet by mouth at bedtime for cholesterol 6)  Metformin Hcl 850 Mg Tabs (Metformin hcl) .... One tablet daily. 7)  Proair Hfa 108 (90 Base) Mcg/act Aers (Albuterol sulfate) .... 2 puffs q4h as needed for wheezing 8)  Flexeril 10 Mg Tabs (Cyclobenzaprine hcl) .... Two times a day prn 9)  Tylenol Extra Strength 500 Mg Tabs (Acetaminophen) .Marland Kitchen.. 1-2 tablets by mouth every 8 hours as needed for arthritis 10)  Autolet Lancing Device Misc (Lancet devices) .... Check blood glucose levels 1-3 times a day 11)  Bayer Contour Test Strp (Glucose blood) .... Use to check blood glucose levels 1-3 times a day 12)  Tramadol Hcl 50 Mg Tabs (Tramadol hcl) .... One tablet by mouth two times a day 13)  Aspirin Ec 81 Mg  Tbec (Aspirin) .Marland Kitchen.. 1 tablet by mouth daily    Prevention & Chronic Care Immunizations   Influenza vaccine: Fluvax MCR  (04/28/2009)   Influenza vaccine due: 04/22/2008    Tetanus booster: 10/22/2003: Done.   Tetanus booster due: 10/21/2013    Pneumococcal vaccine: Not documented  Colorectal Screening   Hemoccult: Not documented    Colonoscopy: Not documented  Other Screening   Pap smear: Done.  (11/21/2003)   Pap smear due: 11/20/2004    Mammogram: ASSESSMENT: Negative - BI-RADS 1^MM DIGITAL SCREENING  (06/22/2009)   Mammogram due: 09/21/2006   Smoking status: never  (12/12/2009)  Diabetes Mellitus   HgbA1C: 6.0  (11/23/2009)   Hemoglobin A1C due: 06/04/2008    Eye exam: normal  (05/22/2006)   Eye exam  due: 05/23/2007    Foot exam: yes  (03/03/2008)   High risk foot: Not documented   Foot care education: Not documented   Foot exam due: 03/03/2009    Urine microalbumin/creatinine ratio: Not documented   Urine microalbumin/cr due: 05/25/2008  Lipids   Total Cholesterol: 176  (03/17/2009)   Lipid panel action/deferral: LDL Direct Ordered   LDL: 324  (03/17/2009)   LDL Direct: 75  (11/23/2009)   HDL: 42  (03/17/2009)   Triglycerides: 89  (03/17/2009)    SGOT (AST): 36  (11/23/2009)   SGPT (ALT): 27  (11/23/2009)   Alkaline phosphatase: 47  (11/23/2009)   Total bilirubin: 0.5  (11/23/2009)  Hypertension   Last Blood Pressure: 107 / 67  (12/23/2009)   Serum creatinine: 0.80  (11/23/2009)   Serum potassium 4.3  (11/23/2009)  Self-Management Support :   Personal Goals (by the next clinic visit) :     Personal A1C goal: 7  (05/09/2009)     Personal blood pressure goal: 140/90  (05/09/2009)     Personal LDL goal: 100  (05/09/2009)    Diabetes self-management support: Copy of home glucose meter record, CBG self-monitoring log, Written self-care plan, Education handout  (11/23/2009)    Hypertension self-management support: Written self-care plan  (11/23/2009)    Lipid self-management support: Written self-care plan  (11/23/2009)

## 2010-08-22 NOTE — Progress Notes (Signed)
  Phone Note Call from Patient   Summary of Call: Diagnosed with BV on 9/14, started on Flagyl.  Since she has started Flagyl, has been having cramping abdominal pain.  No diarrhea, nausea or vomiting.  Tolerating by mouth food and water intake well.  Describes abdominal pain as cramping in nature, bilateral lower quadrants.  Has taking nothing for pain.  Recommended she try some Tylenol for pain, if still having pain may hold further Flagyl doses.  To call Triage nurse if pain continues after stopping Flagyl.  Also explained that if pain increases, starts having fever, she needs to come to ED. Patient expresses understanding Initial call taken by: Renold Don MD,  April 09, 2010 3:23 PM

## 2010-08-22 NOTE — Progress Notes (Signed)
Summary: Tra[facility] call  Phone Note Call from Patient   Call For: [phone] Summary of Call: Haven't had a bowel movement in 5 days.  Was seen for a stomach virus.  Need to know what can be taken. Initial call taken by: Abundio Miu,  June 27, 2010 2:43 PM  Follow-up for Phone Call        Pt states that she feels the need to have a BM but when she goes to [facility] nothing happens.  Her po intake has been good for the past 3 to 4 days.  Advised her to try Dulcolax suppository each morning for a couple of days until things are moving again then take either Colace or Miralix.  Pt agreeable. Follow-up by: Dennison Nancy RN,  June 27, 2010 3:08 PM

## 2010-08-22 NOTE — Progress Notes (Signed)
  Phone Note From Other Clinic   Summary of Call: Called Ms Brianna Wheeler's to let her know her back X-ray did not show a fracture.  She will contuinue to try to move around as tolerated.    Aditionally she was not able to fill her vicodin due to a drug intolerence of it.  She will return to the [facility] if she requires more than tramadol. I am unable to call in anything stronger. Clementeen Graham

## 2010-08-22 NOTE — Progress Notes (Signed)
Summary: Rx Req  Phone Note Refill Request Call back at Home Phone [phone] Message from:  Patient  Refills Requested: Medication #1:  TRAMADOL HCL 50 MG TABS one tablet by mouth three times a day as needed for pain [facility].  Initial call taken by: Clydell Hakim,  November 15, 2009 11:31 AM  Follow-up for Phone Call        Refills only provided after reassessment in the office. Follow-up by: Marisue Ivan  MD,  November 15, 2009 11:56 AM  Additional Follow-up for Phone Call Additional follow up Details #1::        Pt notified to sched apt, voiced understanding Additional Follow-up by: Gladstone Pih,  November 16, 2009 2:16 PM

## 2010-08-22 NOTE — Progress Notes (Signed)
Summary: triage  Phone Note Call from Patient Call back at [phone]   Caller: Patient Summary of Call: fell last night and hurt her arm - needs to talk to nurse Initial call taken by: De Nurse,  July 26, 2009 9:49 AM  Follow-up for Phone Call        got up to answer the phone. her legs gave out & she hit the wall with her R arm. it is sore & has some ROM. unable to move it fully. appt with pcp this afternoon at her request Follow-up by: Golden Circle RN,  July 26, 2009 9:54 AM  Additional Follow-up for Phone Call Additional follow up Details #1::        will f/u accordingly Additional Follow-up by: Marisue Ivan  MD,  July 26, 2009 10:23 AM

## 2010-08-22 NOTE — Progress Notes (Signed)
Summary: results  Phone Note Call from Patient Call back at [phone]   Caller: Patient Summary of Call: wants results of tests from this morning Initial call taken by: De Nurse,  April 05, 2010 1:47 PM  Follow-up for Phone Call        Please let the patient know that she has a Rx ready to pick up to treat bacterial vaginitis.  Follow-up by: Jamie Brookes MD,  April 05, 2010 10:11 PM  Additional Follow-up for Phone Call Additional follow up Details #1::        I explained what BV was & all about the med to take. gave her side effects. may take with food if needed. no alcohol. she also wanted to know about the urine. told her it was fine she had no further questions Additional Follow-up by: Golden Circle RN,  April 06, 2010 9:27 AM    Additional Follow-up for Phone Call Additional follow up Details #2::    Thank you Kennon Rounds.   Follow-up by: Jamie Brookes MD,  April 07, 2010 12:16 PM

## 2010-08-22 NOTE — Progress Notes (Signed)
  Phone Note Call from Patient Call back at Home Phone [phone]   Caller: Patient Summary of Call: Pt called because pt just lost her mother about 3 hours ago and is having trouble dealing with it.  Pt states she was very close to her mom and that she feels the anxiety is gettting the best of her and she is having chest discomfort but no pain.  Pt also just got her husband home from the [place] where he was admitted for three days.  Pt feels she realy needs to have some help getting through this difficult time.  Pt was stold if the chest discomfort turned to pain not relieved with rest and radiation to the arm or neck she should be seen immediately.  Pt though also told if she is doing fine she can be worked in tomorrow morning at 9 am to talk to [facility] about her feelings and helping her cope. Pt agreed and will be there at 830am. Will send flag to admin team Initial call taken by: Antoine Primas DO,  June 28, 2010 8:22 PM

## 2010-08-22 NOTE — Progress Notes (Signed)
Summary: triage  Phone Note Call from Brianna Wheeler Call back at [phone]   Caller: Brianna Wheeler Summary of Call: Pt feels like she is having side effects from the Flagil she is taking. Initial call taken by: Clydell Hakim,  April 12, 2010 9:32 AM  Follow-up for Phone Call        c/o  stomach pains & constipation after taking. today is the last day. wants to know if this will go away after she takes the last pill. told her it will. she is taking fiber daily for c/o constipation. may try rolaids or tums for c/o stomach pain. declined appt. to let us know if she does not feel better after stopping the med Follow-up by: Golden Circle RN,  April 12, 2010 9:38 AM  Additional Follow-up for Phone Call Additional follow up Details #1::        thanks. she should start to feel better soon.  Additional Follow-up by: Jamie Brookes MD,  April 12, 2010 1:49 PM

## 2010-08-22 NOTE — Progress Notes (Signed)
Summary: leg still hurts since 9.29  Phone Note Call from Patient   Summary of Call: patient calling to make sure she is doing everythign right- she feel and hurt her right knee and snkle of sept 29.  Was seen in the ER and by othropedics.  No new fractures.  Continues to have edema and pain.  Advised safe to continue with tramadol, tyleno, icing, and limited weight bearing.   She is to keep appt with Dr. Clotilde Dieter in 2 days and will re-evaluate to see if healing as expected vs need for ortho follow-up.  Initial call taken by: Delbert Harness MD,  April 30, 2010 10:49 AM

## 2010-08-22 NOTE — Assessment & Plan Note (Signed)
Summary: bp check/bmc  Nurse Visit  In for BP check . BP checked after patient rested for several minutes. Checked manually with large adult cuf. BP LA 120/80 , RA 130/80. pulse 82.  Patient discussed issues that she is dealing with that are causing stress in her life at this time. she didn't sleep well last night.  she is taking medication  for BP as directed one tablet every other day.. did not take yesterday and has not taken today yet. Plans to take when she gets home.  will send message to MD and ask when she would like to see her  for follow up. Theresia Lo RN  March 08, 2010 9:01 AM   Allergies: 1)  ! Hydrocodone-Acetaminophen (Hydrocodone-Acetaminophen) 2)  Tetracycline  Orders Added: 1)  No Charge Patient Arrived (NCPA0) [NCPA0]   Would like to see the patient in 1 month. Bp seems good. If not more feelings of low BP will continue every other day until I see her in 1 month. Jamie Brookes MD  March 08, 2010 2:50 PM    message left to call back. Theresia Lo RN  March 09, 2010 9:18 AM   patient notified. Theresia Lo RN  March 09, 2010 10:27 AM

## 2010-08-22 NOTE — Miscellaneous (Signed)
Summary: prior auth  Clinical Lists Changes prior auth for ventolin to pcp to sign. uses [facility].Golden Circle RN  September 06, 2009 3:47 PM  Prior authorization completed and placed in front office to be faxed.........Marland KitchenMarisue Ivan, MD  Medications: Changed medication from VENTOLIN HFA 108 (90 BASE) MCG/ACT  AERS (ALBUTEROL SULFATE) 2 - 4 puffs every four hours as needed for wheezing [BMN] to VENTOLIN HFA 108 (90 BASE) MCG/ACT  AERS (ALBUTEROL SULFATE) 2 - 4 puffs every four hours as needed for wheezing [BMN] - Signed Rx of VENTOLIN HFA 108 (90 BASE) MCG/ACT  AERS (ALBUTEROL SULFATE) 2 - 4 puffs every four hours as needed for wheezing;  #1 x 6 Brand medically necessary;  Signed;  Entered by: Marisue Ivan  MD;  Authorized by: Marisue Ivan  MD;  Method used: Historical    Prescriptions: VENTOLIN HFA 108 (90 BASE) MCG/ACT  AERS (ALBUTEROL SULFATE) 2 - 4 puffs every four hours as needed for wheezing Brand medically necessary #1 x 6   Entered and Authorized by:   Marisue Ivan  MD   Signed by:   Marisue Ivan  MD on 09/12/2009   Method used:   Historical   RxID:   [id number]

## 2010-08-22 NOTE — Assessment & Plan Note (Signed)
Summary: thoracic back pain- [facility] -> improved   Vital Signs:  Patient profile:   55 year old female Height:      71 inches Weight:      310.1 pounds BMI:     43.41 Temp:     97.5 degrees F oral Pulse rate:   85 / minute BP sitting:   112 / 73  (left arm) Cuff size:   large  Vitals Entered By: Gladstone Pih (October 03, 2009 11:05 AM) CC: C/O upper back pain, needs med refilled, discuss X ray results Is Patient Diabetic? Yes Did you bring your meter with you today? No Pain Assessment Patient in pain? yes     Location: upper back Intensity: 4 Type: sharp   Primary Care Provider:  Marisue Ivan  MD  CC:  C/O upper back pain, needs med refilled, and discuss X ray results.  History of Present Illness: 55yo F here for f/u thoracic back pain  Back pain f/u: Last seen on 3/8 after acute back pain after raking leaves.  s/p xrays- no signs of fracture.  Mild scoliosis present.  She states that she is feeling better and moving around better.  Currently taking tramadol and tylenol during the day and flexeril at bedtime.  No radiating pain.  No SOB.  Habits & Providers  Alcohol-Tobacco-Diet     Tobacco Status: never  Current Medications (verified): 1)  Allegra 180 Mg Tabs (Fexofenadine Hcl) .... Take 1 Tablet By Mouth Once A Day 2)  Fluticasone Propionate 50 Mcg/act Susp (Fluticasone Propionate) .... Spray 1 Spray Into Both Nostrils Twice A Day 3)  Lisinopril-Hydrochlorothiazide 10-12.5 Mg  Tabs (Lisinopril-Hydrochlorothiazide) .... One Tablet By Mouth Daily For High Blood Pressure 4)  Levothyroxine Sodium 112 Mcg Tabs (Levothyroxine Sodium) .... Take 1 Tablet By Mouth Once A Day For Hypothyroid 5)  Simvastatin 40 Mg Tabs (Simvastatin) .... One Tablet By Mouth At Bedtime For Cholesterol 6)  Metformin Hcl 850 Mg  Tabs (Metformin Hcl) .... One Tablet Daily. 7)  Proair Hfa 108 (90 Base) Mcg/act Aers (Albuterol Sulfate) .... 2 Puffs Q4h As Needed For Wheezing 8)  Flexeril 10 Mg  Tabs  (Cyclobenzaprine Hcl) .... Two Times A Day Prn 9)  Tylenol Extra Strength 500 Mg Tabs (Acetaminophen) .Marland Kitchen.. 1-2 Tablets By Mouth Every 8 Hours As Needed For Arthritis 10)  Autolet Lancing Device  Misc (Lancet Devices) .... Check Blood Glucose Levels 1-3 Times A Day 11)  Bayer Contour Test  Strp (Glucose Blood) .... Use To Check Blood Glucose Levels 1-3 Times A Day 12)  Tramadol Hcl 50 Mg Tabs (Tramadol Hcl) .... One Tablet By Mouth Three Times A Day As Needed For Pain 13)  Aspirin Ec 81 Mg Tbec (Aspirin) .Marland Kitchen.. 1 Tablet By Mouth Daily  Allergies (verified): 1)  ! Hydrocodone-Acetaminophen (Hydrocodone-Acetaminophen) 2)  Tetracycline  Review of Systems       No radiating pain.  No SOB.  Physical Exam  General:  VS Reviewed. Morbidly obese, well appearing, NAD.  Lungs:  Normal respiratory effort, chest expands symmetrically. Lungs are clear to auscultation, no crackles or wheezes. Heart:  Normal rate and regular rhythm. S1 and S2 normal without gallop, murmur, click, rub or other extra sounds. Msk:  Back: Inspection- no obvious deformities, no erythema, edema or ecchymosis Palpation- No ttp ROM- full flexion, extension, and rotation Neurologic:  no focal deficits   Impression & Recommendations:  Problem # 1:  BACK PAIN, THORACIC REGION, RIGHT (ICD-724.1) Assessment Improved  Symptoms and function improved.  Reviewed xray with the patient. Continue on the the current pain regimen except will inc the tylenol to 2 tabs three times a day.   Advised to use a heating pad and massage generously. Will f/u as needed.  The following medications were removed from the medication list:    Vicodin 5-500 Mg Tabs (Hydrocodone-acetaminophen) .Marland Kitchen... 1 by mouth q6 hrs as needed pain Her updated medication list for this problem includes:    Flexeril 10 Mg Tabs (Cyclobenzaprine hcl) .Marland Kitchen..Marland Kitchen Two times a day prn    Tylenol Extra Strength 500 Mg Tabs (Acetaminophen) .Marland Kitchen... 1-2 tablets by mouth every 8 hours  as needed for arthritis    Tramadol Hcl 50 Mg Tabs (Tramadol hcl) ..... One tablet by mouth three times a day as needed for pain    Aspirin Ec 81 Mg Tbec (Aspirin) .Marland Kitchen... 1 tablet by mouth daily  Orders: [facility]- Est Level  3 (09811)  Complete Medication List: 1)  Allegra 180 Mg Tabs (Fexofenadine hcl) .... Take 1 tablet by mouth once a day 2)  Fluticasone Propionate 50 Mcg/act Susp (Fluticasone propionate) .... Spray 1 spray into both nostrils twice a day 3)  Lisinopril-hydrochlorothiazide 10-12.5 Mg Tabs (Lisinopril-hydrochlorothiazide) .... One tablet by mouth daily for high blood pressure 4)  Levothyroxine Sodium 112 Mcg Tabs (Levothyroxine sodium) .... Take 1 tablet by mouth once a day for hypothyroid 5)  Simvastatin 40 Mg Tabs (Simvastatin) .... One tablet by mouth at bedtime for cholesterol 6)  Metformin Hcl 850 Mg Tabs (Metformin hcl) .... One tablet daily. 7)  Proair Hfa 108 (90 Base) Mcg/act Aers (Albuterol sulfate) .... 2 puffs q4h as needed for wheezing 8)  Flexeril 10 Mg Tabs (Cyclobenzaprine hcl) .... Two times a day prn 9)  Tylenol Extra Strength 500 Mg Tabs (Acetaminophen) .Marland Kitchen.. 1-2 tablets by mouth every 8 hours as needed for arthritis 10)  Autolet Lancing Device Misc (Lancet devices) .... Check blood glucose levels 1-3 times a day 11)  Bayer Contour Test Strp (Glucose blood) .... Use to check blood glucose levels 1-3 times a day 12)  Tramadol Hcl 50 Mg Tabs (Tramadol hcl) .... One tablet by mouth three times a day as needed for pain 13)  Aspirin Ec 81 Mg Tbec (Aspirin) .Marland Kitchen.. 1 tablet by mouth daily  Patient Instructions: 1)  Please schedule a follow-up appointment as needed .  2)  Stay active. 3)  Continue with the heating pad and get Richard to give you messages. 4)  Inc the tylenol to 2 tablets of 500mg  three times a day. Prescriptions: TRAMADOL HCL 50 MG TABS (TRAMADOL HCL) one tablet by mouth three times a day as needed for pain  #45 x 0   Entered and Authorized by:    Marisue Ivan  MD   Signed by:   Marisue Ivan  MD on 10/03/2009   Method used:   Electronically to        [facility]  [phone]* (retail)       [address]       [place], [place]  [address]       Ph: [phone] or [phone]       Fax: [phone]   RxID:   [id number]

## 2010-08-22 NOTE — Assessment & Plan Note (Signed)
Summary: Pes Anscerine bursitis   Vital Signs:  Patient profile:   55 year old female Weight:      299 pounds Temp:     97.6 degrees F oral Pulse rate:   73 / minute Pulse rhythm:   regular BP sitting:   126 / 83  (right arm) Cuff size:   large  Vitals Entered By: Loralee Pacas CMA (May 12, 2010 9:20 AM) CC: right knee swelling   Primary Care Provider:  Jamie Brookes, MD   CC:  right knee swelling.  History of Present Illness: Knee pain: Knee pain started on 9/2011when she fell in a hole with her left foot and landed on her Rt knee. She has pain within 30 minutes of being awake and on her feet. She has knee swelling medially. Knee has not given out on her. She does feel scared of falling on it. She has been exercising despite the knee pain at [facility] and is holding onto the cart to stability.   Habits & Providers  Alcohol-Tobacco-Diet     Alcohol drinks/day: 0     Tobacco Status: never     Diet Comments: diabetic  Current Medications (verified): 1)  Allegra 180 Mg Tabs (Fexofenadine Hcl) .... Take 1 Tablet By Mouth Once A Day 2)  Fluticasone Propionate 50 Mcg/act Susp (Fluticasone Propionate) .... Spray 1 Spray Into Both Nostrils Twice A Day 3)  Levothyroxine Sodium 112 Mcg Tabs (Levothyroxine Sodium) .... Take 1 Tablet By Mouth Once A Day For Hypothyroid 4)  Simvastatin 40 Mg Tabs (Simvastatin) .... One Tablet By Mouth At Bedtime For Cholesterol 5)  Metformin Hcl 850 Mg  Tabs (Metformin Hcl) .... One Tablet Daily. 6)  Proair Hfa 108 (90 Base) Mcg/act Aers (Albuterol Sulfate) .... 2 Puffs Q4h As Needed For Wheezing 7)  Flexeril 10 Mg  Tabs (Cyclobenzaprine Hcl) .... Two Times A Day Prn 8)  Tylenol Extra Strength 500 Mg Tabs (Acetaminophen) .Marland Kitchen.. 1-2 Tablets By Mouth Every 8 Hours As Needed For Arthritis 9)  Autolet Lancing Device  Misc (Lancet Devices) .... Check Blood Glucose Levels 1-3 Times A Day 10)  Bayer Contour Test  Strp (Glucose Blood) .... Use To Check Blood  Glucose Levels 1-3 Times A Day 11)  Tramadol Hcl 50 Mg Tabs (Tramadol Hcl) .... One Tablet By Mouth Two Times A Day 12)  Aspirin Ec 81 Mg Tbec (Aspirin) .Marland Kitchen.. 1 Tablet By Mouth Daily 13)  Prilosec 20 Mg Cpdr (Omeprazole) .Marland Kitchen.. 1 Tab By Mouth Daily 14)  Hydrochlorothiazide 12.5 Mg Caps (Hydrochlorothiazide) .... Take 1 Pill Every Morning. 15)  Metronidazole 500 Mg Tabs (Metronidazole) .... Take 1 Pill Every 12 Hours For 7 Days. 16)  Naproxen 500 Mg Tabs (Naproxen) .... Take 1 Tab Every 12 Hours For 14 Days.  Allergies (verified): 1)  ! Hydrocodone-Acetaminophen (Hydrocodone-Acetaminophen) 2)  Tetracycline  Review of Systems       + knee pain, + medial knee swelling, no other complaints.  Physical Exam  General:  Well-developed,well-nourished,in no acute distress; alert,appropriate and cooperative throughout examination Msk:  equal muscle strength, slight limp when walking, no joint instability on ant or post drawer sign, tenderness over pes bursa.  Extremities:  Rt knee has swelling over anscerine bursa, tender to the touch, no swelling in lower extremities otherwise noted, left ankle brace in place.    Impression & Recommendations:  Problem # 1:  ANSERINE BURSITIS, RIGHT (ICD-726.61) Assessment New Pt given a handout about bursitis. Given Rx for Naproxen. Will plan to hear  back from the patien tin 2 weeks. If the pain is still there at the same level will consider steroid injection.   Orders: [facility]- Est Level  3 ([address])  Complete Medication List: 1)  Allegra 180 Mg Tabs (Fexofenadine hcl) .... Take 1 tablet by mouth once a day 2)  Fluticasone Propionate 50 Mcg/act Susp (Fluticasone propionate) .... Spray 1 spray into both nostrils twice a day 3)  Levothyroxine Sodium 112 Mcg Tabs (Levothyroxine sodium) .... Take 1 tablet by mouth once a day for hypothyroid 4)  Simvastatin 40 Mg Tabs (Simvastatin) .... One tablet by mouth at bedtime for cholesterol 5)  Metformin Hcl 850 Mg Tabs  (Metformin hcl) .... One tablet daily. 6)  Proair Hfa 108 (90 Base) Mcg/act Aers (Albuterol sulfate) .... 2 puffs q4h as needed for wheezing 7)  Flexeril 10 Mg Tabs (Cyclobenzaprine hcl) .... Two times a day prn 8)  Tylenol Extra Strength 500 Mg Tabs (Acetaminophen) .Marland Kitchen.. 1-2 tablets by mouth every 8 hours as needed for arthritis 9)  Autolet Lancing Device Misc (Lancet devices) .... Check blood glucose levels 1-3 times a day 10)  Bayer Contour Test Strp (Glucose blood) .... Use to check blood glucose levels 1-3 times a day 11)  Tramadol Hcl 50 Mg Tabs (Tramadol hcl) .... One tablet by mouth two times a day 12)  Aspirin Ec 81 Mg Tbec (Aspirin) .Marland Kitchen.. 1 tablet by mouth daily 13)  Prilosec 20 Mg Cpdr (Omeprazole) .Marland Kitchen.. 1 tab by mouth daily 14)  Hydrochlorothiazide 12.5 Mg Caps (Hydrochlorothiazide) .... Take 1 pill every morning. 15)  Metronidazole 500 Mg Tabs (Metronidazole) .... Take 1 pill every 12 hours for 7 days. 16)  Naproxen 500 Mg Tabs (Naproxen) .... Take 1 tab every 12 hours for 14 days.  Patient Instructions: 1)  You have a pes anserine bursitis 2)  Treatment is 2 weeks of Naprosyn and if that does not work then we will consider an injection. Prescriptions: NAPROXEN 500 MG TABS (NAPROXEN) take 1 tab every 12 hours for 14 days.  #30 x 0   Entered and Authorized by:   Jamie Brookes MD   Signed by:   Jamie Brookes MD on 05/12/2010   Method used:   Electronically to        [facility]  [phone]* (retail)       [address]       [place], [place]  [address]       Ph: [phone] or [phone]       Fax: [phone]   RxID:   [id number]    Orders Added: 1)  [facility]- Est Level  3 [03474]

## 2010-08-22 NOTE — Assessment & Plan Note (Signed)
Summary: Brianna Wheeler   Primary Care Provider:  Marisue Ivan  MD   History of Present Illness: orthotics help but new pain in righht mtp joint--seems like a lotof weight is falling on taht joint now  Allergies: 1)  ! Hydrocodone-Acetaminophen (Hydrocodone-Acetaminophen) 2)  Tetracycline   Impression & Recommendations:  Problem # 1:  TALIPES CAVUS (ICD-754.71) adjusted r orthotic by placing MT pad to relieve stress off 1st MTp it seemed to help  Complete Medication List: 1)  Allegra 180 Mg Tabs (Fexofenadine hcl) .... Take 1 tablet by mouth once a day 2)  Fluticasone Propionate 50 Mcg/act Susp (Fluticasone propionate) .... Spray 1 spray into both nostrils twice a day 3)  Lisinopril-hydrochlorothiazide 10-12.5 Mg Tabs (Lisinopril-hydrochlorothiazide) .... One tablet by mouth daily for high blood pressure 4)  Levothyroxine Sodium 112 Mcg Tabs (Levothyroxine sodium) .... Take 1 tablet by mouth once a day for hypothyroid 5)  Simvastatin 40 Mg Tabs (Simvastatin) .... One tablet by mouth at bedtime for cholesterol 6)  Metformin Hcl 850 Mg Tabs (Metformin hcl) .... One tablet daily. 7)  Proair Hfa 108 (90 Base) Mcg/act Aers (Albuterol sulfate) .... 2 puffs q4h as needed for wheezing 8)  Flexeril 10 Mg Tabs (Cyclobenzaprine hcl) .... Two times a day prn 9)  Tylenol Extra Strength 500 Mg Tabs (Acetaminophen) .Marland Kitchen.. 1-2 tablets by mouth every 8 hours as needed for arthritis 10)  Autolet Lancing Device Misc (Lancet devices) .... Check blood glucose levels 1-3 times a day 11)  Bayer Contour Test Strp (Glucose blood) .... Use to check blood glucose levels 1-3 times a day 12)  Tramadol Hcl 50 Mg Tabs (Tramadol hcl) .... One tablet by mouth two times a day 13)  Aspirin Ec 81 Mg Tbec (Aspirin) .Marland Kitchen.. 1 tablet by mouth daily  Appended Document: ADJUST ORTHOTICS,[facility]    Clinical Lists Changes  Orders: Added new Service order of No Charge Patient Arrived (NCPA0) (NCPA0) -  Signed       Complete Medication List: 1)  Allegra 180 Mg Tabs (Fexofenadine hcl) .... Take 1 tablet by mouth once a day 2)  Fluticasone Propionate 50 Mcg/act Susp (Fluticasone propionate) .... Spray 1 spray into both nostrils twice a day 3)  Lisinopril-hydrochlorothiazide 10-12.5 Mg Tabs (Lisinopril-hydrochlorothiazide) .... One tablet by mouth daily for high blood pressure 4)  Levothyroxine Sodium 112 Mcg Tabs (Levothyroxine sodium) .... Take 1 tablet by mouth once a day for hypothyroid 5)  Simvastatin 40 Mg Tabs (Simvastatin) .... One tablet by mouth at bedtime for cholesterol 6)  Metformin Hcl 850 Mg Tabs (Metformin hcl) .... One tablet daily. 7)  Proair Hfa 108 (90 Base) Mcg/act Aers (Albuterol sulfate) .... 2 puffs q4h as needed for wheezing 8)  Flexeril 10 Mg Tabs (Cyclobenzaprine hcl) .... Two times a day prn 9)  Tylenol Extra Strength 500 Mg Tabs (Acetaminophen) .Marland Kitchen.. 1-2 tablets by mouth every 8 hours as needed for arthritis 10)  Autolet Lancing Device Misc (Lancet devices) .... Check blood glucose levels 1-3 times a day 11)  Bayer Contour Test Strp (Glucose blood) .... Use to check blood glucose levels 1-3 times a day 12)  Tramadol Hcl 50 Mg Tabs (Tramadol hcl) .... One tablet by mouth two times a day 13)  Aspirin Ec 81 Mg Tbec (Aspirin) .Marland Kitchen.. 1 tablet by mouth daily

## 2010-08-22 NOTE — Assessment & Plan Note (Signed)
Summary: bp,df  Nurse Visit In for BP check following  hospital discharge. medications reviewed and she is taking all that are listed on discharge instructions.  she has stopped lisinopril/HCTZ. BP today  RA 130/68 , LA 122/70 pulse 94. she continues to have chest discomfort that she reports is the same as during hospitalization. reports discomfort when pressure  is applied as when  seatbelt touches on chest. also reports belching and indigestion. she is taking Prilosec daily.  has a follow up with PCP 02/08/2010. Theresia Lo RN  February 01, 2010 1:51 PM   Allergies: 1)  ! Hydrocodone-Acetaminophen (Hydrocodone-Acetaminophen) 2)  Tetracycline  Orders Added: 1)  No Charge Patient Arrived (NCPA0) [NCPA0]

## 2010-08-22 NOTE — Progress Notes (Signed)
Summary: Metformin 850mg  qd #90 x0; Simvastatin 40mg  qd #90 x0  Phone Note Refill Request Call back at Home Phone [phone] Message from:  Patient  Refills Requested: Medication #1:  METFORMIN HCL 850 MG  TABS one tablet daily.  Medication #2:  SIMVASTATIN 40 MG TABS one tablet by mouth at bedtime for cholesterol pt states the pharm told her that she needs to call here.  Initial call taken by: De Nurse,  September 09, 2009 12:02 PM    Prescriptions: METFORMIN HCL 850 MG  TABS (METFORMIN HCL) one tablet daily.  #90 x 0   Entered and Authorized by:   Marisue Ivan  MD   Signed by:   Marisue Ivan  MD on 09/12/2009   Method used:   Electronically to        [facility]  [phone]* (retail)       [address]       [place], [place]  [address]       Ph: [phone] or [phone]       Fax: [phone]   RxID:   [id number] SIMVASTATIN 40 MG TABS (SIMVASTATIN) one tablet by mouth at bedtime for cholesterol  #90 x 0   Entered and Authorized by:   Marisue Ivan  MD   Signed by:   Marisue Ivan  MD on 09/12/2009   Method used:   Electronically to        [facility]  [phone]* (retail)       [address]       [place], [place]  [address]       Ph: [phone] or [phone]       Fax: [phone]   RxID:   [id number]

## 2010-08-22 NOTE — Assessment & Plan Note (Signed)
Summary: pt fell today/rt shoulder and back pain/strother/bmc   Vital Signs:  Patient profile:   55 year old female Height:      71 inches Weight:      302.7 pounds BMI:     42.37 Temp:     98.2 degrees F Pulse rate:   80 / minute BP sitting:   127 / 81  (left arm)  Vitals Entered By: Theresia Lo RN (March 21, 2010 3:51 PM) CC: fell today , pain right shoulder, right knee and low back Is Patient Diabetic? Yes Pain Assessment Patient in pain? yes     Location: right shoulder, right knee and low back Intensity: 6 Type: sharp   Primary Provider:  Jamie Brookes, MD   CC:  fell today , pain right shoulder, and right knee and low back.  History of Present Illness: Pt. is an obese 55 year old female who has been having problems with low blood pressure and dizzyness who, today, stood up from lying down on a couch, got dizzy, lost her balance and fell.  She hit her right shoulder and knee but denes any loss of consciousness.  She now has full ROM in both joints, can bear weight, and is no longer having problems with knee pain.  However, she does admit to continued point tenderness in her right shoulder and came in to make sure she hadn't hurt her shoulder badly.  Habits & Providers  Alcohol-Tobacco-Diet     Tobacco Status: never  Medications Prior to Update: 1)  Allegra 180 Mg Tabs (Fexofenadine Hcl) .... Take 1 Tablet By Mouth Once A Day 2)  Fluticasone Propionate 50 Mcg/act Susp (Fluticasone Propionate) .... Spray 1 Spray Into Both Nostrils Twice A Day 3)  Lisinopril-Hydrochlorothiazide 10-12.5 Mg  Tabs (Lisinopril-Hydrochlorothiazide) .... One Tablet By Mouth Daily For High Blood Pressure 4)  Levothyroxine Sodium 112 Mcg Tabs (Levothyroxine Sodium) .... Take 1 Tablet By Mouth Once A Day For Hypothyroid 5)  Simvastatin 40 Mg Tabs (Simvastatin) .... One Tablet By Mouth At Bedtime For Cholesterol 6)  Metformin Hcl 850 Mg  Tabs (Metformin Hcl) .... One Tablet Daily. 7)  Proair  Hfa 108 (90 Base) Mcg/act Aers (Albuterol Sulfate) .... 2 Puffs Q4h As Needed For Wheezing 8)  Flexeril 10 Mg  Tabs (Cyclobenzaprine Hcl) .... Two Times A Day Prn 9)  Tylenol Extra Strength 500 Mg Tabs (Acetaminophen) .Marland Kitchen.. 1-2 Tablets By Mouth Every 8 Hours As Needed For Arthritis 10)  Autolet Lancing Device  Misc (Lancet Devices) .... Check Blood Glucose Levels 1-3 Times A Day 11)  Bayer Contour Test  Strp (Glucose Blood) .... Use To Check Blood Glucose Levels 1-3 Times A Day 12)  Tramadol Hcl 50 Mg Tabs (Tramadol Hcl) .... One Tablet By Mouth Two Times A Day 13)  Aspirin Ec 81 Mg Tbec (Aspirin) .Marland Kitchen.. 1 Tablet By Mouth Daily 14)  Prilosec 20 Mg Cpdr (Omeprazole) .Marland Kitchen.. 1 Tab By Mouth Daily  Allergies: 1)  ! Hydrocodone-Acetaminophen (Hydrocodone-Acetaminophen) 2)  Tetracycline  Past History:  Past Medical History: Last updated: 03/03/2008 HTN HLD DM II Hypothyroid Obesity (BMI 43 8/09)- has met w/ Dr. Gerilyn Pilgrim for nutrition c/s Chronic back pain (6/06 nonacute T11 compression, DDD T11-12, L4-5)    Postmenopausal  Past Surgical History: Last updated: 08/12/2008 BTL 1983 - 09/11/2001 Herniated disk repair 1990 - 02/01/2002 lumbar films with mod degen changes L4-5, mild sublux - 03/18/2002  Physical Exam  General:  Well-developed, obese,in no acute distress; alert,appropriate and cooperative throughout examination Lungs:  Normal respiratory effort, chest expands symmetrically. Lungs are clear to auscultation, no crackles or wheezes. Heart:  Normal rate and regular rhythm. S1 and S2 normal without gallop, murmur, click, rub or other extra sounds. Msk:  Full ROM in both right knee and shoulder.  No point tenderness or pain on palpation in knee.  Right shoulder has an area of bruising that is approximately 1cm in diameter.  This area is painful to touch, but there is no radiation of the pain and the remainder of her shoulder is not painful to palpation.  She denies any pain on AC joint  palpation.  There is no laxity in her shoulder.    Impression & Recommendations:  Problem # 1:  SHOULDER PAIN, RIGHT (ICD-719.41)  There does not appear to be any findings that would suggest a serious injury or merit further investigation with imaging.  The patient was instructed to continue to rest the joint, that she could take tylenol for pain, and that it would get better with time.  Her updated medication list for this problem includes:    Flexeril 10 Mg Tabs (Cyclobenzaprine hcl) .Marland Kitchen..Marland Kitchen Two times a day prn    Tylenol Extra Strength 500 Mg Tabs (Acetaminophen) .Marland Kitchen... 1-2 tablets by mouth every 8 hours as needed for arthritis    Tramadol Hcl 50 Mg Tabs (Tramadol hcl) ..... One tablet by mouth two times a day    Aspirin Ec 81 Mg Tbec (Aspirin) .Marland Kitchen... 1 tablet by mouth daily  Orders: [facility]- Est Level  3 (81191)  Problem # 2:  ORTHOSTATIC DIZZINESS (ICD-780.4)  The patient reports continued problems with dizziness when changing from supine to standing.  This is what caused her fall today.  This is quite worrisome as a fall in her could cause serious injury.  She was instructed to discontinue taking her lisinopril/HCTZ combination pill to see if this made her symptoms better.  She is also to schedule an appointment to see Dr. Clotilde Dieter in about 2 weeks.  At that time they can discuss further HTN management and if further workup of her symptoms of dizziness are necessary.  Her updated medication list for this problem includes:    Allegra 180 Mg Tabs (Fexofenadine hcl) .Marland Kitchen... Take 1 tablet by mouth once a day  Orders: [facility]- Est Level  3 (47829)  Complete Medication List: 1)  Allegra 180 Mg Tabs (Fexofenadine hcl) .... Take 1 tablet by mouth once a day 2)  Fluticasone Propionate 50 Mcg/act Susp (Fluticasone propionate) .... Spray 1 spray into both nostrils twice a day 3)  Lisinopril-hydrochlorothiazide 10-12.5 Mg Tabs (Lisinopril-hydrochlorothiazide) .... One tablet by mouth daily for high blood  pressure 4)  Levothyroxine Sodium 112 Mcg Tabs (Levothyroxine sodium) .... Take 1 tablet by mouth once a day for hypothyroid 5)  Simvastatin 40 Mg Tabs (Simvastatin) .... One tablet by mouth at bedtime for cholesterol 6)  Metformin Hcl 850 Mg Tabs (Metformin hcl) .... One tablet daily. 7)  Proair Hfa 108 (90 Base) Mcg/act Aers (Albuterol sulfate) .... 2 puffs q4h as needed for wheezing 8)  Flexeril 10 Mg Tabs (Cyclobenzaprine hcl) .... Two times a day prn 9)  Tylenol Extra Strength 500 Mg Tabs (Acetaminophen) .Marland Kitchen.. 1-2 tablets by mouth every 8 hours as needed for arthritis 10)  Autolet Lancing Device Misc (Lancet devices) .... Check blood glucose levels 1-3 times a day 11)  Bayer Contour Test Strp (Glucose blood) .... Use to check blood glucose levels 1-3 times a day 12)  Tramadol Hcl 50  Mg Tabs (Tramadol hcl) .... One tablet by mouth two times a day 13)  Aspirin Ec 81 Mg Tbec (Aspirin) .Marland Kitchen.. 1 tablet by mouth daily 14)  Prilosec 20 Mg Cpdr (Omeprazole) .Marland Kitchen.. 1 tab by mouth daily  Patient Instructions: 1)  It was great to see you today! 2)  Stop taking your lisinopril/hydraclorathiazide and see if your dizziness gets better. 3)  Make sure you are not taking your tramadol more than one time daily. 4)  Make an appointment with Dr. Clotilde Dieter to talk about your dizzyness sometime in the next two weeks.
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
## 2010-08-22 NOTE — Progress Notes (Signed)
Summary: Rx refill: Tramadol  Phone Note Refill Request Call back at Home Phone [phone] Message from:  Patient  Refills Requested: Medication #1:  TRAMADOL HCL 50 MG TABS one tablet by mouth two times a day [facility] [address]  Initial call taken by: Clydell Hakim,  February 13, 2010 10:48 AM    Prescriptions: TRAMADOL HCL 50 MG TABS (TRAMADOL HCL) one tablet by mouth two times a day  #45 x 1   Entered and Authorized by:   Jamie Brookes MD   Signed by:   Jamie Brookes MD on 02/13/2010   Method used:   Electronically to        [facility]  [phone]* (retail)       [address]       [place], [place]  [address]       Ph: [phone] or [phone]       Fax: [phone]   RxID:   [id number]
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
## 2010-08-22 NOTE — Progress Notes (Signed)
Summary: Rx Prob  Phone Note Call from Patient Call back at Home Phone [phone]   Caller: Patient Reason for Call: Refill Medication Summary of Call: Pt says that the rx that Dr. Burnadette Pop put her on this am was not covered by insurance company.  To request for it to be on the formulairy for it to be covered we have to call [phone].   Initial call taken by: Clydell Hakim,  August 22, 2009 2:03 PM  Follow-up for Phone Call        to Dr. Burnadette Pop Follow-up by: Golden Circle RN,  August 22, 2009 2:05 PM  Additional Follow-up for Phone Call Additional follow up Details #1::        I called the number given.  not the part that covers rxs. called pt & asked that she call her pharmacy to fax [place] a prior auth form(lm on vm) Additional Follow-up by: Golden Circle RN,  August 23, 2009 8:46 AM    Additional Follow-up for Phone Call Additional follow up Details #2::    Pt calling to speak to [facility]. Follow-up by: Clydell Hakim,  August 23, 2009 8:56 AM  Additional Follow-up for Phone Call Additional follow up Details #3:: Details for Additional Follow-up Action Taken: states she can feel the mucous draining down her throat & she feels like she did when she had a sinus infection. waould like antibiotics. did not sleep at all last night. wet sounding cough while speaking to me. she will call the pharmacy to get the prior auth form faxed here Additional Follow-up by: Golden Circle RN,  August 23, 2009 9:02 AM    prior auth to pcp to complete. Marland KitchenGolden Circle RN  August 24, 2009 12:00 PM  Paper work completed and faxed......Marland KitchenMarisue Ivan, MD

## 2010-08-22 NOTE — Progress Notes (Signed)
  Phone Note Call from Patient   Caller: Patient Details for Reason: chest pain Summary of Call: Chest pain started this AM, sharp pain then with burning sensation,no shortness of breath but feels her chest is really tight. History of DM, HTN, hyperlipedeima. Noted mother passed recently and husband also sick and pt feels stress. Likely anxiety however with medical history and current chest pain, advised pt to go to ER. I asked her to take 3 baby aspirins as well. Pt voiced understanding. Initial call taken by: Milinda Antis MD,  January 29, 2010 1:42 PM

## 2010-08-22 NOTE — Assessment & Plan Note (Signed)
Summary: R upper arm/shoulder pain s/p fall   Vital Signs:  Patient profile:   55 year old female Height:      71 inches Weight:      304 pounds BMI:     42.55 Temp:     98.7 degrees F BP sitting:   122 / 86  (left arm) Cuff size:   large  Vitals Entered By: Golden Circle RN (July 26, 2009 1:33 PM)  Primary Care Provider:  Marisue Ivan  MD  CC:  R upper arm pain.  History of Present Illness: 55yo F w/ R upper arm pain s/p fall  R upper arm pain: s/p fall this morning around 1am.  States she fell on he left shoulder.  No LOC.  No lacerations or bleeding.  She was able to go back to bed after it happen.  Now she is unable to raise her arm above her head without significant pain. Denies any pain of the wrist or elbow or neck.  Habits & Providers  Alcohol-Tobacco-Diet     Alcohol drinks/day: 0     Tobacco Status: never     Diet Comments: diabetic  Exercise-Depression-Behavior     Drug Use: never     Seat Belt Use: always  Allergies: 1)  Tetracycline  Social History: Risk analyst Use:  always Drug Use:  never  Review of Systems       no LOC, neck pain, or headache  Physical Exam  General:  VS reviewed.  Morbidly obese, NAD Head:  atraumatic.   Neck:  full ROM no cervical spine ttp Msk:  R upper arm: Inspection- no ecchymosis, effusion, or erythema; no obvious deformity or dislocation Palpation- no bony ttp of clavicle and shoulder join; no ttp of elbow or wrist ROM- unable to raise arm above the head due to pain; able to fully extend and flex at the elbow and wrist; able to supinate and pronate forearm Unable to adequately test the shoulder due to pain Reach across to other shoulder was normal   Impression & Recommendations:  Problem # 1:  SHOULDER PAIN, RIGHT (ICD-719.41) Assessment New Acute R upper arm/shoulder pain s/p fall.  No signs of dislocation or significant deformity on exam.  Plan to obtain xrays today.  Will treat with tramadol scheduled  every 6 hours x 48 hours with tylenol scheduled.  Ice every hour for 20 minutes for next 48 hours.  Will await results and treat accordingly.   Her updated medication list for this problem includes:    Flexeril 10 Mg Tabs (Cyclobenzaprine hcl) .Marland Kitchen..Marland Kitchen Two times a day prn    Tylenol Extra Strength 500 Mg Tabs (Acetaminophen) .Marland Kitchen... 1-2 tablets by mouth every 8 hours as needed for arthritis    Tramadol Hcl 50 Mg Tabs (Tramadol hcl) ..... One tablet by mouth three times a day as needed for pain    Aspirin Ec 81 Mg Tbec (Aspirin) .Marland Kitchen... 1 tablet by mouth daily  Orders: Radiology other (Radiology Other) [facility]- Est Level  3 ([address]46)  Complete Medication List: 1)  Allegra 180 Mg Tabs (Fexofenadine hcl) .... Take 1 tablet by mouth once a day 2)  Fluticasone Propionate 50 Mcg/act Susp (Fluticasone propionate) .... Spray 1 spray into both nostrils twice a day 3)  Lisinopril-hydrochlorothiazide 10-12.5 Mg Tabs (Lisinopril-hydrochlorothiazide) .... One tablet by mouth daily for high blood pressure 4)  Levothyroxine Sodium 112 Mcg Tabs (Levothyroxine sodium) .... Take 1 tablet by mouth once a day for hypothyroid 5)  Simvastatin 40  Mg Tabs (Simvastatin) .... One tablet by mouth at bedtime for cholesterol 6)  Metformin Hcl 850 Mg Tabs (Metformin hcl) .... One tablet daily. 7)  Ventolin Hfa 108 (90 Base) Mcg/act Aers (Albuterol sulfate) .... 2 - 4 puffs every four hours as needed for wheezing 8)  Flexeril 10 Mg Tabs (Cyclobenzaprine hcl) .... Two times a day prn 9)  Tylenol Extra Strength 500 Mg Tabs (Acetaminophen) .Marland Kitchen.. 1-2 tablets by mouth every 8 hours as needed for arthritis 10)  Autolet Lancing Device Misc (Lancet devices) .... Check blood glucose levels 1-3 times a day 11)  Bayer Contour Test Strp (Glucose blood) .... Use to check blood glucose levels 1-3 times a day 12)  Tramadol Hcl 50 Mg Tabs (Tramadol hcl) .... One tablet by mouth three times a day as needed for pain 13)  Aspirin Ec 81 Mg Tbec (Aspirin)  .Marland Kitchen.. 1 tablet by mouth daily  Patient Instructions: 1)  I will call you today with xray results and we'll discuss what to do from there. 2)  In the meantime, schedule the tramadol every 6 hours for the next 2 days.   3)  Ice every hour for 20 minutes.

## 2010-08-22 NOTE — Progress Notes (Signed)
Summary: meds prob  Phone Note Call from Patient Call back at Home Phone [phone]   Caller: Patient Summary of Call: got a call from ins and they are denying her cough meds - wants to know what else she can take.   [facility] [place] Initial call taken by: De Nurse,  August 30, 2009 3:10 PM  Follow-up for Phone Call        to pcp Follow-up by: Golden Circle RN,  August 30, 2009 3:17 PM  Additional Follow-up for Phone Call Additional follow up Details #1::        She will have to use the otc cough suppressants. Additional Follow-up by: Marisue Ivan  MD,  August 30, 2009 3:24 PM     Appended Document: meds prob discussed with her. advised sipping on water all day & sucking on sugarless candy to keep mouth & throat moist

## 2010-08-22 NOTE — Assessment & Plan Note (Signed)
Summary: [name]   Vital Signs:  Patient profile:   55 year old female Height:      71 inches Weight:      300 pounds BMI:     41.99 Temp:     98.2 degrees F oral Pulse rate:   80 / minute BP sitting:   124 / 70  (left arm) Cuff size:   large  Vitals Entered By: Tessie Fass CMA (May 02, 2010 2:06 PM) CC: knee contusion, ankle sprain, broken finger.  Is Patient Diabetic? Yes   Primary Care Provider:  Jamie Brookes, MD   CC:  knee contusion, ankle sprain, and broken finger. Brianna Wheeler  History of Present Illness: Falls:Knee contusion and ankle sprain: Pt had a fall where she got her foot stuck in a hole on 04/20/10. She sprained her left ankle and contused her Rt knee. She was seen in the ED and no fracture was found. She has been using crutches, family has been helping, and she is using a brace.   Finger broken: She got into the tub to do a soak last night and then while getting out of the tub she slipped and broke her finger. Her finger was put in a splint and she was told to follow up here today. X-ray showed a proximal phalageal subluxation and a fracture.   Current Medications (verified): 1)  Allegra 180 Mg Tabs (Fexofenadine Hcl) .... Take 1 Tablet By Mouth Once A Day 2)  Fluticasone Propionate 50 Mcg/act Susp (Fluticasone Propionate) .... Spray 1 Spray Into Both Nostrils Twice A Day 3)  Levothyroxine Sodium 112 Mcg Tabs (Levothyroxine Sodium) .... Take 1 Tablet By Mouth Once A Day For Hypothyroid 4)  Simvastatin 40 Mg Tabs (Simvastatin) .... One Tablet By Mouth At Bedtime For Cholesterol 5)  Metformin Hcl 850 Mg  Tabs (Metformin Hcl) .... One Tablet Daily. 6)  Proair Hfa 108 (90 Base) Mcg/act Aers (Albuterol Sulfate) .... 2 Puffs Q4h As Needed For Wheezing 7)  Flexeril 10 Mg  Tabs (Cyclobenzaprine Hcl) .... Two Times A Day Prn 8)  Tylenol Extra Strength 500 Mg Tabs (Acetaminophen) .Brianna Wheeler.. 1-2 Tablets By Mouth Every 8 Hours As Needed For Arthritis 9)  Autolet Lancing Device   Misc (Lancet Devices) .... Check Blood Glucose Levels 1-3 Times A Day 10)  Bayer Contour Test  Strp (Glucose Blood) .... Use To Check Blood Glucose Levels 1-3 Times A Day 11)  Tramadol Hcl 50 Mg Tabs (Tramadol Hcl) .... One Tablet By Mouth Two Times A Day 12)  Aspirin Ec 81 Mg Tbec (Aspirin) .Brianna Wheeler.. 1 Tablet By Mouth Daily 13)  Prilosec 20 Mg Cpdr (Omeprazole) .Brianna Wheeler.. 1 Tab By Mouth Daily 14)  Hydrochlorothiazide 12.5 Mg Caps (Hydrochlorothiazide) .... Take 1 Pill Every Morning. 15)  Metronidazole 500 Mg Tabs (Metronidazole) .... Take 1 Pill Every 12 Hours For 7 Days.  Allergies (verified): 1)  ! Hydrocodone-Acetaminophen (Hydrocodone-Acetaminophen) 2)  Tetracycline  Review of Systems       finger pain, ankle pain, back pain from using crutches  Physical Exam  General:  Well-developed,well-nourished,in no acute distress; alert,appropriate and cooperative throughout examination Msk:  left ankle with some bruising, no swelling, moving in all directions,  Right ring finger wrapped, no swelling present, no bruising   Impression & Recommendations:  Problem # 1:  FRACTURE, FINGER, PROXIMAL (ICD-816.01) Assessment New Pt fractured finger last night, was seen in ED. Told then that she goes to [facility] and they told her to see Dr. Darrick Penna. The finger is  wrapped, I reviewed the x-ray. proximal phalageal joint subluxed and fractured. I have sent a flag to Dr. Jennette Kettle to ask advice as to whether the patient should see Dr. Darrick Penna or orthopedic surgery.   Orders: Bone Density Scan - [facility]  ([address]) [facility]- Est Level  3 ([address]454)  Problem # 2:  ANKLE SPRAIN, LEFT (ICD-845.00) Assessment: Unchanged ankle has less swelling, less bruising, she is able to walk without crutches. Continue to ice.   Her updated medication list for this problem includes:    Tylenol Extra Strength 500 Mg Tabs (Acetaminophen) .Brianna Wheeler... 1-2 tablets by mouth every 8 hours as needed for arthritis    Tramadol Hcl 50 Mg Tabs (Tramadol hcl) .....  One tablet by mouth two times a day    Aspirin Ec 81 Mg Tbec (Aspirin) .Brianna Wheeler... 1 tablet by mouth daily  Orders: [facility]- Est Level  3 (09811)  Problem # 3:  CONTUSION, RIGHT KNEE (ICD-924.11) Assessment: Unchanged Pt is to continue to ice, swelling still present with bruising. increase activity as tolerated  Orders: [facility]- Est Level  3 (99213)  Complete Medication List: 1)  Allegra 180 Mg Tabs (Fexofenadine hcl) .... Take 1 tablet by mouth once a day 2)  Fluticasone Propionate 50 Mcg/act Susp (Fluticasone propionate) .... Spray 1 spray into both nostrils twice a day 3)  Levothyroxine Sodium 112 Mcg Tabs (Levothyroxine sodium) .... Take 1 tablet by mouth once a day for hypothyroid 4)  Simvastatin 40 Mg Tabs (Simvastatin) .... One tablet by mouth at bedtime for cholesterol 5)  Metformin Hcl 850 Mg Tabs (Metformin hcl) .... One tablet daily. 6)  Proair Hfa 108 (90 Base) Mcg/act Aers (Albuterol sulfate) .... 2 puffs q4h as needed for wheezing 7)  Flexeril 10 Mg Tabs (Cyclobenzaprine hcl) .... Two times a day prn 8)  Tylenol Extra Strength 500 Mg Tabs (Acetaminophen) .Brianna Wheeler.. 1-2 tablets by mouth every 8 hours as needed for arthritis 9)  Autolet Lancing Device Misc (Lancet devices) .... Check blood glucose levels 1-3 times a day 10)  Bayer Contour Test Strp (Glucose blood) .... Use to check blood glucose levels 1-3 times a day 11)  Tramadol Hcl 50 Mg Tabs (Tramadol hcl) .... One tablet by mouth two times a day 12)  Aspirin Ec 81 Mg Tbec (Aspirin) .Brianna Wheeler.. 1 tablet by mouth daily 13)  Prilosec 20 Mg Cpdr (Omeprazole) .Brianna Wheeler.. 1 tab by mouth daily 14)  Hydrochlorothiazide 12.5 Mg Caps (Hydrochlorothiazide) .... Take 1 pill every morning. 15)  Metronidazole 500 Mg Tabs (Metronidazole) .... Take 1 pill every 12 hours for 7 days.  Patient Instructions: 1)  keep icing the ankle, and now the finger, and the knee. It will be a while until the swelling all goes down and everything feels normal again.  2)  You can stop  the crutches if you feel stable on the ankle.  3)  I will call you with recommendations about the finger.   Appended Document: [name]    Clinical Lists Changes  Orders: Added new Referral order of Orthopedic Referral (Ortho) - Signed

## 2010-08-22 NOTE — Progress Notes (Signed)
Summary: Rx Prob  Phone Note Call from Patient Call back at Home Phone [phone]   Caller: Patient Summary of Call: Pt can't take Hydrocone because pharmacy said she was allergic.  What else can she take. Initial call taken by: Clydell Hakim,  September 28, 2009 4:25 PM  Follow-up for Phone Call        Called back and told her than I cannot call anything in. She will take two tramadols and see if that helps.    New Allergies: ! HYDROCODONE-ACETAMINOPHEN (HYDROCODONE-ACETAMINOPHEN) New Allergies: ! HYDROCODONE-ACETAMINOPHEN (HYDROCODONE-ACETAMINOPHEN)

## 2010-08-22 NOTE — Miscellaneous (Signed)
Summary: re: ortho appt  Clinical Lists Changes scheduled appt with [facility] today at 1:30. called pt to notify of appt and she says she cannot make that appt today because of another appt in [place]. Advised pt she should keep this appt if at all possible because she really needs to have that finger checked out by orthopedic surgeon and not sure when she would be able to get another appt with them. Gave pt phone number to call and reschedule if she absolutely has to. Also called [facility] but they do not accept her insurance.Tessie Fass CMA  May 03, 2010 10:39 AM   Appended Document: re: ortho appt patient called back to ask questions about her finger. She asked me what she should do about icing her finger on and 20 min off. She asked if she should still do this until her aqppt. tomorrow withy dr. Ophelia Charter @ [facility]. I advised her that she should still do what was told to her and that i cannot tell her to stop that. If she had any more questons about her finger and to call dr Ophelia Charter to ask questions about her finger.

## 2010-08-22 NOTE — Progress Notes (Signed)
  Phone Note Call from Patient   Caller: Patient Summary of Call: ate banana bread this afternoon and is now throwing up.  throwing up for 10 min.  no fever.  just food in the vomit.  recommended fluids.  gave red flags.  todl pt to call if still vomitting in the AM Initial call taken by: Ellery Plunk MD,  June 21, 2010 7:30 PM

## 2010-08-22 NOTE — Progress Notes (Signed)
Summary: refill  Phone Note Refill Request Call back at [phone] Message from:  Patient  Refills Requested: Medication #1:  VENTOLIN HFA 108 (90 BASE) MCG/ACT  AERS 2 - 4 puffs every four hours as needed for wheezing [BMN] Initial call taken by: De Nurse,  August 31, 2009 3:03 PM  Follow-up for Phone Call        to pcp Follow-up by: Golden Circle RN,  August 31, 2009 3:20 PM  Additional Follow-up for Phone Call Additional follow up Details #1::        Pt calling back about rx she requested. Additional Follow-up by: Clydell Hakim,  September 02, 2009 12:18 PM    Additional Follow-up for Phone Call Additional follow up Details #2::    completed by Golden Circle Follow-up by: Marisue Ivan  MD,  September 05, 2009 1:28 PM

## 2010-08-22 NOTE — Miscellaneous (Signed)
Summary: DM Supplies  Patient says that a new form is being faxed over to be filled out because there was not an NPI number for [facility] on the form.   Please fax it back to them after completed. Bradly Bienenstock  June 12, 2010 11:32 AM  I will most certainly do it tomorrow while i am in clinic. Jamie Brookes MD  June 12, 2010 8:11 PM  Form completed and put on Sally's desk. Jamie Brookes MD  June 13, 2010 1:30 PM

## 2010-08-22 NOTE — Miscellaneous (Signed)
Summary: Simvastatin refill  Clinical Lists Changes  Medications: Rx of SIMVASTATIN 40 MG TABS (SIMVASTATIN) one tablet by mouth at bedtime for cholesterol;  #90 x 3;  Signed;  Entered by: Jamie Brookes MD;  Authorized by: Jamie Brookes MD;  Method used: Electronically to [facility]  #[id number]*, [address], [place], [place], [place]  [address], Ph: [phone] or [phone], Fax: [phone]    Prescriptions: SIMVASTATIN 40 MG TABS (SIMVASTATIN) one tablet by mouth at bedtime for cholesterol  #90 x 3   Entered and Authorized by:   Jamie Brookes MD   Signed by:   Jamie Brookes MD on 01/26/2010   Method used:   Electronically to        [facility]  [id number]* (retail)       [address]       [place], [place]  [address]       Ph: [phone] or [phone]       Fax: [phone]   RxID:   [id number]

## 2010-08-22 NOTE — Progress Notes (Signed)
Summary: triage  Phone Note Call from Patient Call back at [phone]   Caller: Patient Summary of Call: Mom is passing away and pt is not sleeping well and wondering if she can get something to help her rest for a couple of days. Initial call taken by: Clydell Hakim,  October 17, 2009 9:13 AM  Follow-up for Phone Call        mom is in [facility] & dying. "double pna, kidneys shutting down, pulmonary edema, heart is failing.  pt is not sleeping.   uses [facility] [address] told her I will ask md about meds & call her back Follow-up by: Golden Circle RN,  October 17, 2009 9:23 AM  Additional Follow-up for Phone Call Additional follow up Details #1::        would recommend trying hydroxyzine prior to any true sleep meds. will send rx to pharmacy.  Additional Follow-up by: Lequita Asal  MD,  October 17, 2009 9:30 AM    Additional Follow-up for Phone Call Additional follow up Details #2::    pt notified Follow-up by: Golden Circle RN,  October 17, 2009 9:34 AM  New/Updated Medications: HYDROXYZINE HCL 50 MG TABS (HYDROXYZINE HCL) one tab by mouth 30 minutes prior to bedtime as needed for difficulty sleeping Prescriptions: HYDROXYZINE HCL 50 MG TABS (HYDROXYZINE HCL) one tab by mouth 30 minutes prior to bedtime as needed for difficulty sleeping  #30 x 0   Entered and Authorized by:   Lequita Asal  MD   Signed by:   Lequita Asal  MD on 10/17/2009   Method used:   Electronically to        [facility]  [id number]* (retail)       [address]       [place], [place]  [address]       Ph: [phone] or [phone]       Fax: [phone]   RxID:   [id number]

## 2010-08-22 NOTE — Assessment & Plan Note (Signed)
Summary: f/u chronic issues   Vital Signs:  Patient profile:   55 year old female Height:      71 inches Weight:      309.2 pounds BMI:     43.28 Temp:     98.2 degrees F oral Pulse rate:   85 / minute BP sitting:   114 / 76  (left arm) Cuff size:   large  Vitals Entered By: Gladstone Pih (Nov 23, 2009 2:37 PM) CC: F/U, med refill Is Patient Diabetic? Yes Did you bring your meter with you today? No Pain Assessment Patient in pain? no        Primary Care Provider:  Marisue Ivan  MD  CC:  F/U and med refill.  History of Present Illness: 55yo F here to f/u on chronic issues  HTN: No adverse effects from medication.  Not checking it regularly.  Was well controlled at last visit.  No dizziness, HA, CP, palpitations, or swelling.  DM:  Currently on  Metformin.  No adverse effects.  No hypoglycemic events.   CBGs checked 2 times a day and typically run 110s.  HLD: Tolerating medication.  No adverse effects.  No muscle pain or abd pain.  Chronic back pain: Worsened after being without tramadol.  States that she is still able to function but becomes more sedentary when she has pain.  Obesity: Currently working on losing wt by walking.     Habits & Providers  Alcohol-Tobacco-Diet     Tobacco Status: never  Current Medications (verified): 1)  Allegra 180 Mg Tabs (Fexofenadine Hcl) .... Take 1 Tablet By Mouth Once A Day 2)  Fluticasone Propionate 50 Mcg/act Susp (Fluticasone Propionate) .... Spray 1 Spray Into Both Nostrils Twice A Day 3)  Lisinopril-Hydrochlorothiazide 10-12.5 Mg  Tabs (Lisinopril-Hydrochlorothiazide) .... One Tablet By Mouth Daily For High Blood Pressure 4)  Levothyroxine Sodium 112 Mcg Tabs (Levothyroxine Sodium) .... Take 1 Tablet By Mouth Once A Day For Hypothyroid 5)  Simvastatin 40 Mg Tabs (Simvastatin) .... One Tablet By Mouth At Bedtime For Cholesterol 6)  Metformin Hcl 850 Mg  Tabs (Metformin Hcl) .... One Tablet Daily. 7)  Proair Hfa 108 (90  Base) Mcg/act Aers (Albuterol Sulfate) .... 2 Puffs Q4h As Needed For Wheezing 8)  Flexeril 10 Mg  Tabs (Cyclobenzaprine Hcl) .... Two Times A Day Prn 9)  Tylenol Extra Strength 500 Mg Tabs (Acetaminophen) .Marland Kitchen.. 1-2 Tablets By Mouth Every 8 Hours As Needed For Arthritis 10)  Autolet Lancing Device  Misc (Lancet Devices) .... Check Blood Glucose Levels 1-3 Times A Day 11)  Bayer Contour Test  Strp (Glucose Blood) .... Use To Check Blood Glucose Levels 1-3 Times A Day 12)  Tramadol Hcl 50 Mg Tabs (Tramadol Hcl) .... One Tablet By Mouth Three Times A Day As Needed For Pain 13)  Aspirin Ec 81 Mg Tbec (Aspirin) .Marland Kitchen.. 1 Tablet By Mouth Daily  Allergies (verified): 1)  ! Hydrocodone-Acetaminophen (Hydrocodone-Acetaminophen) 2)  Tetracycline  Past History:  Past Medical History: Last updated: 03/03/2008 HTN HLD DM II Hypothyroid Obesity (BMI 43 8/09)- has met w/ Dr. Gerilyn Pilgrim for nutrition c/s Chronic back pain (6/06 nonacute T11 compression, DDD T11-12, L4-5)    Postmenopausal  Review of Systems      See HPI  Physical Exam  General:  VS Reviewed. Morbidly obese, well appearing, NAD.  Lungs:  Normal respiratory effort, chest expands symmetrically. Lungs are clear to auscultation, no crackles or wheezes. Heart:  Normal rate and regular rhythm. S1  and S2 normal without gallop, murmur, click, rub or other extra sounds. Abdomen:  obese, soft, NT, ND, no mass Extremities:  no edema Neurologic:  no focal deficits   Impression & Recommendations:  Problem # 1:  BACK PAIN, LUMBAR, WITH RADICULOPATHY (ICD-724.4) Assessment Unchanged  Pt w/ chronic pain that is well controlled with tylenol and tramadol. After in depth discussion, I am confident that she is not dependent upon the medication but helps her to better function physically. Pain contract with Tramadol implemented and to be scanned. Wll provide standing Tramadol #30 each month.  Her updated medication list for this problem includes:     Flexeril 10 Mg Tabs (Cyclobenzaprine hcl) .Marland Kitchen..Marland Kitchen Two times a day prn    Tylenol Extra Strength 500 Mg Tabs (Acetaminophen) .Marland Kitchen... 1-2 tablets by mouth every 8 hours as needed for arthritis    Tramadol Hcl 50 Mg Tabs (Tramadol hcl) ..... One tablet by mouth three times a day as needed for pain    Aspirin Ec 81 Mg Tbec (Aspirin) .Marland Kitchen... 1 tablet by mouth daily  Orders: [facility]- Est  Level 4 (91478)  Problem # 2:  DIABETES MELLITUS, TYPE II, CONTROLLED (ICD-250.00) Assessment: Unchanged  at goal A1c 6% no changes to regimen  Her updated medication list for this problem includes:    Lisinopril-hydrochlorothiazide 10-12.5 Mg Tabs (Lisinopril-hydrochlorothiazide) ..... One tablet by mouth daily for high blood pressure    Metformin Hcl 850 Mg Tabs (Metformin hcl) ..... One tablet daily.    Aspirin Ec 81 Mg Tbec (Aspirin) .Marland Kitchen... 1 tablet by mouth daily  Orders: A1C-[facility] (29562) Comp Met-[facility] (13086-57846) [facility]- Est  Level 4 ([address]295)  Problem # 3:  HYPERTENSION, BENIGN SYSTEMIC (ICD-401.1) Assessment: Unchanged At goal (<140/90). No changes to regimen.  Her updated medication list for this problem includes:    Lisinopril-hydrochlorothiazide 10-12.5 Mg Tabs (Lisinopril-hydrochlorothiazide) ..... One tablet by mouth daily for high blood pressure  Orders: Comp Met-[facility] (28413-24401) [facility]- Est  Level 4 (99214)  Problem # 4:  OBESITY, MORBID (ICD-278.01) Assessment: Unchanged  BMI 43 Wt loss counseling discussed  Orders: [facility]- Est  Level 4 (02725)  Problem # 5:  HYPERLIPIDEMIA (ICD-272.4) Assessment: Unchanged Check direct LDL today (goal <100)  Her updated medication list for this problem includes:    Simvastatin 40 Mg Tabs (Simvastatin) ..... One tablet by mouth at bedtime for cholesterol  Orders: T-Lipoprotein (LDL cholesterol)  (36644-03474) Comp Met-[facility] (25956-38756) [facility]- Est  Level 4 (43329)  Complete Medication List: 1)  Allegra 180 Mg Tabs (Fexofenadine hcl) .... Take 1 tablet  by mouth once a day 2)  Fluticasone Propionate 50 Mcg/act Susp (Fluticasone propionate) .... Spray 1 spray into both nostrils twice a day 3)  Lisinopril-hydrochlorothiazide 10-12.5 Mg Tabs (Lisinopril-hydrochlorothiazide) .... One tablet by mouth daily for high blood pressure 4)  Levothyroxine Sodium 112 Mcg Tabs (Levothyroxine sodium) .... Take 1 tablet by mouth once a day for hypothyroid 5)  Simvastatin 40 Mg Tabs (Simvastatin) .... One tablet by mouth at bedtime for cholesterol 6)  Metformin Hcl 850 Mg Tabs (Metformin hcl) .... One tablet daily. 7)  Proair Hfa 108 (90 Base) Mcg/act Aers (Albuterol sulfate) .... 2 puffs q4h as needed for wheezing 8)  Flexeril 10 Mg Tabs (Cyclobenzaprine hcl) .... Two times a day prn 9)  Tylenol Extra Strength 500 Mg Tabs (Acetaminophen) .Marland Kitchen.. 1-2 tablets by mouth every 8 hours as needed for arthritis 10)  Autolet Lancing Device Misc (Lancet devices) .... Check blood glucose levels 1-3 times a day 11)  Bayer Contour Test Strp (Glucose blood) .... Use to check blood glucose levels 1-3 times a day 12)  Tramadol Hcl 50 Mg Tabs (Tramadol hcl) .... One tablet by mouth three times a day as needed for pain 13)  Aspirin Ec 81 Mg Tbec (Aspirin) .Marland Kitchen.. 1 tablet by mouth daily  Patient Instructions: 1)  Please schedule a follow-up appointment in 4 months .  2)  Good job with the diabetes control. 3)  Goal for wt loss is 1lb a week...you can do it. 4)  We signed a contract today that states no more than #30 of tramadol per month. Prescriptions: TRAMADOL HCL 50 MG TABS (TRAMADOL HCL) one tablet by mouth three times a day as needed for pain  #30 x 0   Entered and Authorized by:   Marisue Ivan  MD   Signed by:   Marisue Ivan  MD on 11/23/2009   Method used:   Electronically to        [facility]  [phone]* (retail)       [address]       [place], [place]  [address]       Ph: [phone] or [phone]       Fax:  [phone]   RxID:   [id number] METFORMIN HCL 850 MG  TABS (METFORMIN HCL) one tablet daily.  #90 x 1   Entered and Authorized by:   Marisue Ivan  MD   Signed by:   Marisue Ivan  MD on 11/23/2009   Method used:   Electronically to        [facility]  [phone]* (retail)       [address]       [place], [place]  [address]       Ph: [phone] or [phone]       Fax: [phone]   RxID:   [id number] LEVOTHYROXINE SODIUM 112 MCG TABS (LEVOTHYROXINE SODIUM) Take 1 tablet by mouth once a day for hypothyroid  #90 x 1   Entered and Authorized by:   Marisue Ivan  MD   Signed by:   Marisue Ivan  MD on 11/23/2009   Method used:   Electronically to        [facility]  [phone]* (retail)       [address]       [place], [place]  [address]       Ph: [phone] or [phone]       Fax: [phone]   RxID:   [id number] LISINOPRIL-HYDROCHLOROTHIAZIDE 10-12.5 MG  TABS (LISINOPRIL-HYDROCHLOROTHIAZIDE) one tablet by mouth daily for high blood pressure  #90 x 1   Entered and Authorized by:   Marisue Ivan  MD   Signed by:   Marisue Ivan  MD on 11/23/2009   Method used:   Electronically to        [facility]  [phone]* (retail)       [address]       [place], [place]  [address]       Ph: [phone] or [phone]       Fax: [phone]   RxID:   [id number]   Laboratory Results   Blood Tests   Date/Time Received: Nov 23, 2009 2:44 PM  Date/Time Reported: Nov 23, 2009 2:55  PM   HGBA1C: 6.0%   (Normal Range: Non-Diabetic - 3-6%   Control Diabetic - 6-8%)  Comments: ...........test performed by...........Marland KitchenTerese Door, CMA       Prevention & Chronic Care Immunizations   Influenza vaccine: Fluvax MCR  (04/28/2009)   Influenza vaccine due: 04/22/2008    Tetanus booster: 10/22/2003: Done.   Tetanus booster due:  10/21/2013    Pneumococcal vaccine: Not documented  Colorectal Screening   Hemoccult: Not documented    Colonoscopy: Not documented  Other Screening   Pap smear: Done.  (11/21/2003)   Pap smear due: 11/20/2004    Mammogram: ASSESSMENT: Negative - BI-RADS 1^MM DIGITAL SCREENING  (06/22/2009)   Mammogram due: 09/21/2006   Smoking status: never  (11/23/2009)  Diabetes Mellitus   HgbA1C: 6.0  (11/23/2009)   Hemoglobin A1C due: 06/04/2008    Eye exam: normal  (05/22/2006)   Eye exam due: 05/23/2007    Foot exam: yes  (03/03/2008)   High risk foot: Not documented   Foot care education: Not documented   Foot exam due: 03/03/2009    Urine microalbumin/creatinine ratio: Not documented   Urine microalbumin/cr due: 05/25/2008    Diabetes flowsheet reviewed?: Yes   Progress toward A1C goal: At goal  Lipids   Total Cholesterol: 176  (03/17/2009)   Lipid panel action/deferral: LDL Direct Ordered   LDL: 409  (03/17/2009)   LDL Direct: 110  (05/09/2009)   HDL: 42  (03/17/2009)   Triglycerides: 89  (03/17/2009)    SGOT (AST): 39  (03/17/2009)   SGPT (ALT): 29  (03/17/2009) CMP ordered    Alkaline phosphatase: 44  (03/17/2009)   Total bilirubin: 0.6  (03/17/2009)    Lipid flowsheet reviewed?: Yes   Progress toward LDL goal: Unchanged  Hypertension   Last Blood Pressure: 114 / 76  (11/23/2009)   Serum creatinine: 0.88  (03/17/2009)   Serum potassium 4.3  (03/17/2009) CMP ordered     Hypertension flowsheet reviewed?: Yes   Progress toward BP goal: At goal  Self-Management Support :   Personal Goals (by the next clinic visit) :     Personal A1C goal: 7  (05/09/2009)     Personal blood pressure goal: 140/90  (05/09/2009)     Personal LDL goal: 100  (05/09/2009)    Patient will work on the following items until the next clinic visit to reach self-care goals:     Medications and monitoring: take my medicines every day, check my blood sugar, check my blood pressure, bring  all of my medications to every visit  (11/23/2009)     Eating: drink diet soda or water instead of juice or soda, eat more vegetables, use fresh or frozen vegetables, eat foods that are low in salt, eat baked foods instead of fried foods, eat fruit for snacks and desserts, limit or avoid alcohol  (11/23/2009)     Activity: take a 30 minute walk every day  (05/09/2009)    Diabetes self-management support: Copy of home glucose meter record, CBG self-monitoring log, Written self-care plan, Education handout  (11/23/2009)   Diabetes care plan printed   Diabetes education handout printed    Hypertension self-management support: Written self-care plan  (11/23/2009)   Hypertension self-care plan printed.    Lipid self-management support: Written self-care plan  (11/23/2009)   Lipid self-care plan printed.

## 2010-08-22 NOTE — Assessment & Plan Note (Signed)
Summary: foot pain/pt diabetic,tcb   Vital Signs:  Patient profile:   55 year old female Height:      71 inches Weight:      305.2 pounds BMI:     42.72 Temp:     98.0 degrees F oral Pulse rate:   74 / minute BP sitting:   105 / 66  (left arm) Cuff size:   large  Vitals Entered By: Gladstone Pih (Dec 12, 2009 4:29 PM) Is Patient Diabetic? Yes Did you bring your meter with you today? No Pain Assessment Patient in pain? yes     Location: foot Intensity: 5 Type: burning Onset of pain  X3 days   Primary Care Provider:  Marisue Ivan  MD   History of Present Illness: Right dorsal foot pain for several days.  Was placed on a pain contract to recieve only tramadol 50 mg one tab daily by MD.  She had been taking three times a day and felt it really helped her pain.  She has been using Tylenol, she purchased new shoes (did not look so new).  Has had multiple traumas to that foot with fractures.  The pain is burning in nature.  Her diabetes had been under good control.  Habits & Providers  Alcohol-Tobacco-Diet     Tobacco Status: never  Current Medications (verified): 1)  Allegra 180 Mg Tabs (Fexofenadine Hcl) .... Take 1 Tablet By Mouth Once A Day 2)  Fluticasone Propionate 50 Mcg/act Susp (Fluticasone Propionate) .... Spray 1 Spray Into Both Nostrils Twice A Day 3)  Lisinopril-Hydrochlorothiazide 10-12.5 Mg  Tabs (Lisinopril-Hydrochlorothiazide) .... One Tablet By Mouth Daily For High Blood Pressure 4)  Levothyroxine Sodium 112 Mcg Tabs (Levothyroxine Sodium) .... Take 1 Tablet By Mouth Once A Day For Hypothyroid 5)  Simvastatin 40 Mg Tabs (Simvastatin) .... One Tablet By Mouth At Bedtime For Cholesterol 6)  Metformin Hcl 850 Mg  Tabs (Metformin Hcl) .... One Tablet Daily. 7)  Proair Hfa 108 (90 Base) Mcg/act Aers (Albuterol Sulfate) .... 2 Puffs Q4h As Needed For Wheezing 8)  Flexeril 10 Mg  Tabs (Cyclobenzaprine Hcl) .... Two Times A Day Prn 9)  Tylenol Extra Strength 500  Mg Tabs (Acetaminophen) .Marland Kitchen.. 1-2 Tablets By Mouth Every 8 Hours As Needed For Arthritis 10)  Autolet Lancing Device  Misc (Lancet Devices) .... Check Blood Glucose Levels 1-3 Times A Day 11)  Bayer Contour Test  Strp (Glucose Blood) .... Use To Check Blood Glucose Levels 1-3 Times A Day 12)  Tramadol Hcl 50 Mg Tabs (Tramadol Hcl) .... One Tablet By Mouth Two Times A Day 13)  Aspirin Ec 81 Mg Tbec (Aspirin) .Marland Kitchen.. 1 Tablet By Mouth Daily  Allergies (verified): 1)  ! Hydrocodone-Acetaminophen (Hydrocodone-Acetaminophen) 2)  Tetracycline  Review of Systems      See HPI  Physical Exam  General:  Usual appearing, in good spirits Extremities:  Feet with very high bony insteps and high arches.  Old tennis shoes well worn.  Decreased in microfiliment sensory testing bilaterally.  Pulses +2 and symmetrical at [facility] and PT.  No callouses or ulcerations.  Tender with palpation of metatarsals.   Impression & Recommendations:  Problem # 1:  METATARSALGIA (ICD-726.70)  Requested from primary MD to increase pain med to two times a day for a few days.  Refer to [facility] clinic for their opinion.  Discussed continued weight loss ( has lost over 100#).  May benefit from inserts as shoes appear to be worn out.  Orders: [facility]-  Est Level  3 ([address]213)  Complete Medication List: 1)  Allegra 180 Mg Tabs (Fexofenadine hcl) .... Take 1 tablet by mouth once a day 2)  Fluticasone Propionate 50 Mcg/act Susp (Fluticasone propionate) .... Spray 1 spray into both nostrils twice a day 3)  Lisinopril-hydrochlorothiazide 10-12.5 Mg Tabs (Lisinopril-hydrochlorothiazide) .... One tablet by mouth daily for high blood pressure 4)  Levothyroxine Sodium 112 Mcg Tabs (Levothyroxine sodium) .... Take 1 tablet by mouth once a day for hypothyroid 5)  Simvastatin 40 Mg Tabs (Simvastatin) .... One tablet by mouth at bedtime for cholesterol 6)  Metformin Hcl 850 Mg Tabs (Metformin hcl) .... One tablet daily. 7)  Proair Hfa 108 (90 Base)  Mcg/act Aers (Albuterol sulfate) .... 2 puffs q4h as needed for wheezing 8)  Flexeril 10 Mg Tabs (Cyclobenzaprine hcl) .... Two times a day prn 9)  Tylenol Extra Strength 500 Mg Tabs (Acetaminophen) .Marland Kitchen.. 1-2 tablets by mouth every 8 hours as needed for arthritis 10)  Autolet Lancing Device Misc (Lancet devices) .... Check blood glucose levels 1-3 times a day 11)  Bayer Contour Test Strp (Glucose blood) .... Use to check blood glucose levels 1-3 times a day 12)  Tramadol Hcl 50 Mg Tabs (Tramadol hcl) .... One tablet by mouth two times a day 13)  Aspirin Ec 81 Mg Tbec (Aspirin) .Marland Kitchen.. 1 tablet by mouth daily  Patient Instructions: 1)  One two times a day for one week then daily 2)  Apt with Dr. Darrick Penna for metatarsalga  Prescriptions: TRAMADOL HCL 50 MG TABS (TRAMADOL HCL) one tablet by mouth two times a day  #45 x 0   Entered and Authorized by:   Luretha Murphy NP   Signed by:   Luretha Murphy NP on 12/12/2009   Method used:   Electronically to        [facility]  [phone]* (retail)       [address]       [place], [place]  [address]       Ph: [phone] or [phone]       Fax: [phone]   RxID:   [id number]

## 2010-08-22 NOTE — Letter (Signed)
Summary: Generic Letter  [facility]  [address]   [place], [place] [address]   Phone: [phone]  Fax: [phone]    05/02/2010  Brianna Wheeler [address] [place], [place]  [address]  Dear [name],   I have made an appointment for you to have a Bone Density Scan. It is scheduled for May 03, 2010 at 10:30 am with [facility] located at [employer]. [address]. [address], ph. [phone].  Please bring in a list of your current medications when you come.  If you cannot make this appoinment please be sure to call 24 hours in advance to cancel or reschedule.  Thank you in advance for your time and attention to this matter.   Sincerely,   Loralee Pacas CMA

## 2010-08-22 NOTE — Progress Notes (Signed)
Summary: Triage  Phone Note Call from Patient Call back at [phone]   Reason for Call: Talk to Nurse Summary of Call: pts blood sugar is 158 & is feeling lightheaded. Initial call taken by: Knox Royalty,  January 02, 2010 3:17 PM  Follow-up for Phone Call        states she collapsed at [employer]. drank Dr. Reino Kent & then drove home.  had breakfast at  9:30am from [employer]. grilled egg biscuit  unsweetened tea. eating a salad now. . told her this is too long betwen meals & must aim for higher quality foods. biscuits are high in fats & the egg was fried. urged her to plan meals, shop for those meals & eat at home as mcuh as possible. aim for 5 small meals a day. include more protien. gave examples. deficient in vegtables. suggested different ways to remind self to get 5 servings in per day. explained the dangers of blood sugars getting too low. advised carrying hard candy or the glucose tabs in her purse. BUT eating regularly will prevent lows.  will send md note to get order for diabetes edu classes. she has many questions & is not sure what she should be doing.   states she injured the same ankle iin the fall. she is going to call [facility] to get f/u. states they told her she may need to waer a brace Follow-up by: Golden Circle RN,  January 02, 2010 3:26 PM  Additional Follow-up for Phone Call Additional follow up Details #1::        I will order diabetes education classes. Sounds like she would benifit from them.  Additional Follow-up by: Jamie Brookes MD,  January 10, 2010 8:56 AM

## 2010-08-22 NOTE — Assessment & Plan Note (Signed)
Summary: n/v,df   Vital Signs:  Patient profile:   55 year old female Height:      71 inches Weight:      292 pounds BMI:     40.87 Temp:     97.9 degrees F oral Pulse rate:   88 / minute BP sitting:   118 / 78  (left arm) Cuff size:   regular  Vitals Entered By: Tessie Fass CMA (June 22, 2010 10:26 AM) CC: N & V x 1 day Pain Assessment Patient in pain? no        Primary Care Provider:  Jamie Brookes, MD   CC:  N & V x 1 day.  History of Present Illness: Started vomiting last night, has nausea, low grade fever and denies diarrhea.  She voided this am, but it a little dizzy when she stands.  She took all of her meds this morning and they stayed down.  Her husband is with her.  Current Medications (verified): 1)  Allegra 180 Mg Tabs (Fexofenadine Hcl) .... Take 1 Tablet By Mouth Once A Day 2)  Fluticasone Propionate 50 Mcg/act Susp (Fluticasone Propionate) .... Spray 1 Spray Into Both Nostrils Twice A Day 3)  Levothyroxine Sodium 112 Mcg Tabs (Levothyroxine Sodium) .... Take 1 Tablet By Mouth Once A Day For Hypothyroid 4)  Simvastatin 40 Mg Tabs (Simvastatin) .... One Tablet By Mouth At Bedtime For Cholesterol 5)  Metformin Hcl 850 Mg  Tabs (Metformin Hcl) .... One Tablet Daily. 6)  Proair Hfa 108 (90 Base) Mcg/act Aers (Albuterol Sulfate) .... 2 Puffs Q4h As Needed For Wheezing 7)  Flexeril 10 Mg  Tabs (Cyclobenzaprine Hcl) .... Two Times A Day Prn 8)  Tylenol Extra Strength 500 Mg Tabs (Acetaminophen) .Marland Kitchen.. 1-2 Tablets By Mouth Every 8 Hours As Needed For Arthritis 9)  Autolet Lancing Device  Misc (Lancet Devices) .... Check Blood Glucose Levels 1-3 Times A Day 10)  Bayer Contour Test  Strp (Glucose Blood) .... Use To Check Blood Glucose Levels 1-3 Times A Day 11)  Tramadol Hcl 50 Mg Tabs (Tramadol Hcl) .... One Tablet By Mouth Two Times A Day 12)  Aspirin Ec 81 Mg Tbec (Aspirin) .Marland Kitchen.. 1 Tablet By Mouth Daily 13)  Prilosec 20 Mg Cpdr (Omeprazole) .Marland Kitchen.. 1 Tab By Mouth  Daily 14)  Hydrochlorothiazide 12.5 Mg Caps (Hydrochlorothiazide) .... Take 1 Pill Every Morning. 15)  Ondansetron Hcl 8 Mg Tabs (Ondansetron Hcl) .... One Three Times A Day As Needed For Nausea and Vomiting  Allergies (verified): 1)  ! Hydrocodone-Acetaminophen (Hydrocodone-Acetaminophen) 2)  Tetracycline  Review of Systems General:  Complains of loss of appetite, malaise, and sweats; denies chills and fever. GI:  Complains of nausea and vomiting; denies abdominal pain, bloody stools, and diarrhea. GU:  Denies dysuria.  Physical Exam  General:  Looked pale and weak Ears:  R ear normal and L ear normal.   Mouth:  membranes are moist Lungs:  normal respiratory effort and normal breath sounds.   Heart:  normal rate, regular rhythm, and no murmur.   Abdomen:  very large, + bs, non tender   Impression & Recommendations:  Problem # 1:  NAUSEA AND VOMITING (ICD-787.01)  Likely viral gastroenteritis, she appears to have stopped vomiting but still nauseated.  Zofran script and a 4 mg tab given in the office.  See instructions.  Orders: [facility]- Est Level  3 (54098)  Complete Medication List: 1)  Allegra 180 Mg Tabs (Fexofenadine hcl) .... Take 1 tablet by  mouth once a day 2)  Fluticasone Propionate 50 Mcg/act Susp (Fluticasone propionate) .... Spray 1 spray into both nostrils twice a day 3)  Levothyroxine Sodium 112 Mcg Tabs (Levothyroxine sodium) .... Take 1 tablet by mouth once a day for hypothyroid 4)  Simvastatin 40 Mg Tabs (Simvastatin) .... One tablet by mouth at bedtime for cholesterol 5)  Metformin Hcl 850 Mg Tabs (Metformin hcl) .... One tablet daily. 6)  Proair Hfa 108 (90 Base) Mcg/act Aers (Albuterol sulfate) .... 2 puffs q4h as needed for wheezing 7)  Flexeril 10 Mg Tabs (Cyclobenzaprine hcl) .... Two times a day prn 8)  Tylenol Extra Strength 500 Mg Tabs (Acetaminophen) .Marland Kitchen.. 1-2 tablets by mouth every 8 hours as needed for arthritis 9)  Autolet Lancing Device Misc (Lancet  devices) .... Check blood glucose levels 1-3 times a day 10)  Bayer Contour Test Strp (Glucose blood) .... Use to check blood glucose levels 1-3 times a day 11)  Tramadol Hcl 50 Mg Tabs (Tramadol hcl) .... One tablet by mouth two times a day 12)  Aspirin Ec 81 Mg Tbec (Aspirin) .Marland Kitchen.. 1 tablet by mouth daily 13)  Prilosec 20 Mg Cpdr (Omeprazole) .Marland Kitchen.. 1 tab by mouth daily 14)  Hydrochlorothiazide 12.5 Mg Caps (Hydrochlorothiazide) .... Take 1 pill every morning. 15)  Ondansetron Hcl 8 Mg Tabs (Ondansetron hcl) .... One three times a day as needed for nausea and vomiting  Patient Instructions: 1)  Do not take metformin or HCTZ until you are eating and drinking well 2)  May hold allegra and simvastatin as well 3)  Drink as much as you can, in small amounts, recommend Gatorade 4)  You may get diarrhea. 5)  Zofran three times a day prn Prescriptions: ONDANSETRON HCL 8 MG TABS (ONDANSETRON HCL) one three times a day as needed for nausea and vomiting  #15 x 0   Entered and Authorized by:   Luretha Murphy NP   Signed by:   Luretha Murphy NP on 06/22/2010   Method used:   Electronically to        [facility]  [phone]* (retail)       [address]       [place], [place]  [address]       Ph: [phone] or [phone]       Fax: [phone]   RxID:   [id number]    Orders Added: 1)  [facility]- Est Level  3 [63875]

## 2010-08-22 NOTE — Progress Notes (Signed)
Summary: re: fall/ts  Phone Note Call from Patient Call back at [phone]   Caller: Patient Summary of Call: Pt fell about hour ago in hole.  Don't feel like anything is broken just ankle sore.  What should she do for it? Initial call taken by: Clydell Hakim,  April 20, 2010 2:00 PM  Follow-up for Phone Call        CALLED PT. was walking in yard and fell. right knee and ankle pain. feels like nothing broke.  advised to use ACE bandage on ankle (pt used this before) ice for swelling and elevation of leg. if not improved call back tomorrow. pt agreed. tylenol for pain. Follow-up by: Arlyss Repress CMA,,  April 20, 2010 3:26 PM

## 2010-08-22 NOTE — Assessment & Plan Note (Signed)
Summary: Stress/anxiety, Dm2   Vital Signs:  Patient profile:   55 year old female Height:      71 inches Weight:      302.8 pounds BMI:     42.38 Temp:     98.3 degrees F oral Pulse rate:   86 / minute BP sitting:   100 / 70  (left arm) Cuff size:   large  Vitals Entered By: Garen Grams LPN (February 22, 2010 10:41 AM) CC: stress, f/u meds Is Patient Diabetic? Yes Did you bring your meter with you today? No Pain Assessment Patient in pain? no        Primary Care Provider:  Jamie Brookes, MD   CC:  stress and f/u meds.  History of Present Illness: Stress: Pt is here today for medication review and refills. She has had a lot of family drama in her life recently and is trying to take care of HCPOA stuff with her mom. All the family members are fighting. She also takes care of her disabled son and husband. Pt is very stressed. Trying to hold it together and does not want to go on another pill.   DM2: Pt is taking her BP meds but says taht sometimes her BP goes too low. It is low today. Discussed that she should be on the BP pills to protect her kidneys but we don't want to drop her BP too low. We came up with a plan. No problems with feet, blood sugars. A1c today is 6.0.  Habits & Providers  Alcohol-Tobacco-Diet     Tobacco Status: never  Current Medications (verified): 1)  Allegra 180 Mg Tabs (Fexofenadine Hcl) .... Take 1 Tablet By Mouth Once A Day 2)  Fluticasone Propionate 50 Mcg/act Susp (Fluticasone Propionate) .... Spray 1 Spray Into Both Nostrils Twice A Day 3)  Lisinopril-Hydrochlorothiazide 10-12.5 Mg  Tabs (Lisinopril-Hydrochlorothiazide) .... One Tablet By Mouth Daily For High Blood Pressure 4)  Levothyroxine Sodium 112 Mcg Tabs (Levothyroxine Sodium) .... Take 1 Tablet By Mouth Once A Day For Hypothyroid 5)  Simvastatin 40 Mg Tabs (Simvastatin) .... One Tablet By Mouth At Bedtime For Cholesterol 6)  Metformin Hcl 850 Mg  Tabs (Metformin Hcl) .... One Tablet  Daily. 7)  Proair Hfa 108 (90 Base) Mcg/act Aers (Albuterol Sulfate) .... 2 Puffs Q4h As Needed For Wheezing 8)  Flexeril 10 Mg  Tabs (Cyclobenzaprine Hcl) .... Two Times A Day Prn 9)  Tylenol Extra Strength 500 Mg Tabs (Acetaminophen) .Marland Kitchen.. 1-2 Tablets By Mouth Every 8 Hours As Needed For Arthritis 10)  Autolet Lancing Device  Misc (Lancet Devices) .... Check Blood Glucose Levels 1-3 Times A Day 11)  Bayer Contour Test  Strp (Glucose Blood) .... Use To Check Blood Glucose Levels 1-3 Times A Day 12)  Tramadol Hcl 50 Mg Tabs (Tramadol Hcl) .... One Tablet By Mouth Two Times A Day 13)  Aspirin Ec 81 Mg Tbec (Aspirin) .Marland Kitchen.. 1 Tablet By Mouth Daily 14)  Prilosec 20 Mg Cpdr (Omeprazole) .Marland Kitchen.. 1 Tab By Mouth Daily  Allergies (verified): 1)  ! Hydrocodone-Acetaminophen (Hydrocodone-Acetaminophen) 2)  Tetracycline  Review of Systems        vitals reviewed and pertinent negatives and positives seen in HPI   Physical Exam  General:  Well-developed,well-nourished,in no acute distress; alert,appropriate and cooperative throughout examination Lungs:  Normal respiratory effort, chest expands symmetrically. Lungs are clear to auscultation, no crackles or wheezes. Heart:  Normal rate and regular rhythm. S1 and S2 normal without gallop, murmur,  click, rub or other extra sounds. Psych:  normally interactive and good eye contact.     Impression & Recommendations:  Problem # 1:  ANXIETY, SITUATIONAL (ICD-308.3) Assessment New Pt is having significant stress in her life with her mother on hospice and her siblings fighting about her mom's things. She does not want to go on medication at this time but may need to in the future.   Orders: [facility]- Est Level  3 (56387)  Problem # 2:  DIABETES MELLITUS, TYPE II, CONTROLLED (ICD-250.00) Assessment: Improved Pt is doing well with her glucose control Today's A1c was 6.0. She has been having some low BP's when taking the Lisinopril/HCTZ combo. Plan to have her take  the medicine every other day for a while and see how she does. Asked her to drink plenty of water to keep her BP up. I will see her again in 2 weeks.   Her updated medication list for this problem includes:    Lisinopril-hydrochlorothiazide 10-12.5 Mg Tabs (Lisinopril-hydrochlorothiazide) ..... One tablet by mouth daily for high blood pressure    Metformin Hcl 850 Mg Tabs (Metformin hcl) ..... One tablet daily.    Aspirin Ec 81 Mg Tbec (Aspirin) .Marland Kitchen... 1 tablet by mouth daily  Orders: A1C-[facility] (56433) [facility]- Est Level  3 (29518)  Complete Medication List: 1)  Allegra 180 Mg Tabs (Fexofenadine hcl) .... Take 1 tablet by mouth once a day 2)  Fluticasone Propionate 50 Mcg/act Susp (Fluticasone propionate) .... Spray 1 spray into both nostrils twice a day 3)  Lisinopril-hydrochlorothiazide 10-12.5 Mg Tabs (Lisinopril-hydrochlorothiazide) .... One tablet by mouth daily for high blood pressure 4)  Levothyroxine Sodium 112 Mcg Tabs (Levothyroxine sodium) .... Take 1 tablet by mouth once a day for hypothyroid 5)  Simvastatin 40 Mg Tabs (Simvastatin) .... One tablet by mouth at bedtime for cholesterol 6)  Metformin Hcl 850 Mg Tabs (Metformin hcl) .... One tablet daily. 7)  Proair Hfa 108 (90 Base) Mcg/act Aers (Albuterol sulfate) .... 2 puffs q4h as needed for wheezing 8)  Flexeril 10 Mg Tabs (Cyclobenzaprine hcl) .... Two times a day prn 9)  Tylenol Extra Strength 500 Mg Tabs (Acetaminophen) .Marland Kitchen.. 1-2 tablets by mouth every 8 hours as needed for arthritis 10)  Autolet Lancing Device Misc (Lancet devices) .... Check blood glucose levels 1-3 times a day 11)  Bayer Contour Test Strp (Glucose blood) .... Use to check blood glucose levels 1-3 times a day 12)  Tramadol Hcl 50 Mg Tabs (Tramadol hcl) .... One tablet by mouth two times a day 13)  Aspirin Ec 81 Mg Tbec (Aspirin) .Marland Kitchen.. 1 tablet by mouth daily 14)  Prilosec 20 Mg Cpdr (Omeprazole) .Marland Kitchen.. 1 tab by mouth daily  Patient Instructions: 1)  We adjusted  your meds. I have refilled 3 of them.  2)  Keep up the good work with exercising and weight loss.  3)  Take 1 blood pressure pill every other day and drink lots of water (8-10 glasses a day).  4)  I will see you in about 2 weeks for a blood pressure check.  Prescriptions: METFORMIN HCL 850 MG  TABS (METFORMIN HCL) one tablet daily.  #90 x 3   Entered and Authorized by:   Jamie Brookes MD   Signed by:   Jamie Brookes MD on 02/22/2010   Method used:   Electronically to        [facility]  [phone]* (retail)       [phone]  [place], [place]  [address]       Ph: [phone] or [phone]       Fax: [phone]   RxID:   [id number] LEVOTHYROXINE SODIUM 112 MCG TABS (LEVOTHYROXINE SODIUM) Take 1 tablet by mouth once a day for hypothyroid  #31 x 5   Entered and Authorized by:   Jamie Brookes MD   Signed by:   Jamie Brookes MD on 02/22/2010   Method used:   Electronically to        [facility]  [phone]* (retail)       [address]       [place], [place]  [address]       Ph: [phone] or [phone]       Fax: [phone]   RxID:   [id number] LISINOPRIL-HYDROCHLOROTHIAZIDE 10-12.5 MG  TABS (LISINOPRIL-HYDROCHLOROTHIAZIDE) one tablet by mouth daily for high blood pressure  #31 x 4   Entered and Authorized by:   Jamie Brookes MD   Signed by:   Jamie Brookes MD on 02/22/2010   Method used:   Electronically to        [facility]  [phone]* (retail)       [address]       [place], [place]  [address]       Ph: [phone] or [phone]       Fax: [phone]   RxID:   [id number]    Prevention & Chronic Care Immunizations   Influenza vaccine: Fluvax MCR  (04/28/2009)   Influenza vaccine due: 04/22/2008    Tetanus booster: 10/22/2003: Done.   Tetanus booster due: 10/21/2013    Pneumococcal vaccine: Not documented  Colorectal Screening    Hemoccult: Not documented    Colonoscopy: Not documented  Other Screening   Pap smear: Done.  (11/21/2003)   Pap smear due: 11/20/2004    Mammogram: ASSESSMENT: Negative - BI-RADS 1^MM DIGITAL SCREENING  (06/22/2009)   Mammogram due: 09/21/2006   Smoking status: never  (02/22/2010)  Diabetes Mellitus   HgbA1C: 6.0  (02/22/2010)   Hemoglobin A1C due: 06/04/2008    Eye exam: normal  (05/22/2006)   Eye exam due: 05/23/2007    Foot exam: yes  (03/03/2008)   High risk foot: Not documented   Foot care education: Not documented   Foot exam due: 03/03/2009    Urine microalbumin/creatinine ratio: Not documented   Urine microalbumin/cr due: 05/25/2008    Diabetes flowsheet reviewed?: Yes   Progress toward A1C goal: Improved  Lipids   Total Cholesterol: 176  (03/17/2009)   Lipid panel action/deferral: LDL Direct Ordered   LDL: 169  (03/17/2009)   LDL Direct: 75  (11/23/2009)   HDL: 42  (03/17/2009)   Triglycerides: 89  (03/17/2009)    SGOT (AST): 36  (11/23/2009)   SGPT (ALT): 27  (11/23/2009)   Alkaline phosphatase: 47  (11/23/2009)   Total bilirubin: 0.5  (11/23/2009)    Lipid flowsheet reviewed?: Yes   Progress toward LDL goal: Unchanged  Hypertension   Last Blood Pressure: 100 / 70  (02/22/2010)   Serum creatinine: 0.80  (11/23/2009)   Serum potassium 4.3  (11/23/2009)    Hypertension flowsheet reviewed?: Yes   Progress toward BP goal: Improved  Self-Management Support :   Personal Goals (by the next clinic visit) :     Personal A1C goal: 7  (05/09/2009)  Personal blood pressure goal: 140/90  (05/09/2009)     Personal LDL goal: 100  (05/09/2009)    Patient will work on the following items until the next clinic visit to reach self-care goals:     Medications and monitoring: take my medicines every day, check my blood pressure, bring all of my medications to every visit  (02/22/2010)     Eating: eat more vegetables, use fresh or frozen vegetables   (02/22/2010)     Activity: take a 30 minute walk every day  (02/22/2010)    Diabetes self-management support: Written self-care plan  (02/22/2010)   Diabetes care plan printed    Hypertension self-management support: Written self-care plan  (02/22/2010)   Hypertension self-care plan printed.    Lipid self-management support: Written self-care plan  (02/22/2010)   Lipid self-care plan printed.   Laboratory Results   Blood Tests   Date/Time Received: February 22, 2010 10:44 AM  Date/Time Reported: February 22, 2010 12:01 PM   HGBA1C: 6.0%   (Normal Range: Non-Diabetic - 3-6%   Control Diabetic - 6-8%)  Comments: ...............test performed by.................Marland KitchenGaren Grams, LPN .............entered by...........Marland KitchenBonnie A. [place], MLS (ASCP)cm

## 2010-08-22 NOTE — Assessment & Plan Note (Signed)
Summary: BV, fatigue    Vital Signs:  Patient profile:   55 year old female Weight:      305 pounds Temp:     99.4 degrees F oral Pulse rate:   80 / minute Pulse rhythm:   regular BP sitting:   94 / 59  (left arm) Cuff size:   large  Vitals Entered By: Loralee Pacas CMA (April 05, 2010 10:55 AM) CC: vaginal itching, fatigue   Primary Care Provider:  Jamie Brookes, MD   CC:  vaginal itching and fatigue.  History of Present Illness: Vaginal Itching: Pt has had some vaginal itching for the last few days. She has changed her soap, used vagisil, monostat, she is having some increased urinary frequency and says that she has some odor as well.     Fatigue: Pt confides in me that she is feeling overwhelmed with taking care of her mom who is on hospice care. She is not having any CP or palpitations but does feel very tired and overwhelmed taking care of her mom's house ans bills. She is co-HCPOA   Habits & Providers  Alcohol-Tobacco-Diet     Alcohol drinks/day: 0     Tobacco Status: never     Diet Comments: diabetic  Current Medications (verified): 1)  Allegra 180 Mg Tabs (Fexofenadine Hcl) .... Take 1 Tablet By Mouth Once A Day 2)  Fluticasone Propionate 50 Mcg/act Susp (Fluticasone Propionate) .... Spray 1 Spray Into Both Nostrils Twice A Day 3)  Levothyroxine Sodium 112 Mcg Tabs (Levothyroxine Sodium) .... Take 1 Tablet By Mouth Once A Day For Hypothyroid 4)  Simvastatin 40 Mg Tabs (Simvastatin) .... One Tablet By Mouth At Bedtime For Cholesterol 5)  Metformin Hcl 850 Mg  Tabs (Metformin Hcl) .... One Tablet Daily. 6)  Proair Hfa 108 (90 Base) Mcg/act Aers (Albuterol Sulfate) .... 2 Puffs Q4h As Needed For Wheezing 7)  Flexeril 10 Mg  Tabs (Cyclobenzaprine Hcl) .... Two Times A Day Prn 8)  Tylenol Extra Strength 500 Mg Tabs (Acetaminophen) .Marland Kitchen.. 1-2 Tablets By Mouth Every 8 Hours As Needed For Arthritis 9)  Autolet Lancing Device  Misc (Lancet Devices) .... Check Blood  Glucose Levels 1-3 Times A Day 10)  Bayer Contour Test  Strp (Glucose Blood) .... Use To Check Blood Glucose Levels 1-3 Times A Day 11)  Tramadol Hcl 50 Mg Tabs (Tramadol Hcl) .... One Tablet By Mouth Two Times A Day 12)  Aspirin Ec 81 Mg Tbec (Aspirin) .Marland Kitchen.. 1 Tablet By Mouth Daily 13)  Prilosec 20 Mg Cpdr (Omeprazole) .Marland Kitchen.. 1 Tab By Mouth Daily 14)  Hydrochlorothiazide 12.5 Mg Caps (Hydrochlorothiazide) .... Take 1 Pill Every Morning. 15)  Metronidazole 500 Mg Tabs (Metronidazole) .... Take 1 Pill Every 12 Hours For 7 Days.  Allergies (verified): 1)  ! Hydrocodone-Acetaminophen (Hydrocodone-Acetaminophen) 2)  Tetracycline  Review of Systems        vitals reviewed and pertinent negatives and positives seen in HPI   Physical Exam  General:  Well-developed,well-nourished,in no acute distress; alert,appropriate and cooperative throughout examination Genitalia:  Normal introitus for age, no external lesions, no vaginal discharge, mucosa pink and moist, no vaginal or cervical lesions, no vaginal atrophy, no friaility or hemorrhage, normal uterus size and position, no adnexal masses or tenderness Psych:  Cognition and judgment appear intact. Alert and cooperative with normal attention span and concentration. No apparent delusions, illusions, hallucinations   Impression & Recommendations:  Problem # 1:  PRURITUS, VAGINAL (ICD-698.1) Assessment New Pt appears to have  BV. Will plan to treat with Metronidazole. Wil call patient with results.  UA was normal.  Orders: Wet Prep- [facility] (87210) [facility]- Est Level  3 ([address])  Problem # 2:  FATIGUE (ICD-780.79) Assessment: Unchanged Pt has been feeling fatigued lately with taking care of her hospice mother. No change to regimine.   Orders: [facility]- Est Level  3 ([address]454)  Complete Medication List: 1)  Allegra 180 Mg Tabs (Fexofenadine hcl) .... Take 1 tablet by mouth once a day 2)  Fluticasone Propionate 50 Mcg/act Susp (Fluticasone propionate) ....  Spray 1 spray into both nostrils twice a day 3)  Levothyroxine Sodium 112 Mcg Tabs (Levothyroxine sodium) .... Take 1 tablet by mouth once a day for hypothyroid 4)  Simvastatin 40 Mg Tabs (Simvastatin) .... One tablet by mouth at bedtime for cholesterol 5)  Metformin Hcl 850 Mg Tabs (Metformin hcl) .... One tablet daily. 6)  Proair Hfa 108 (90 Base) Mcg/act Aers (Albuterol sulfate) .... 2 puffs q4h as needed for wheezing 7)  Flexeril 10 Mg Tabs (Cyclobenzaprine hcl) .... Two times a day prn 8)  Tylenol Extra Strength 500 Mg Tabs (Acetaminophen) .Marland Kitchen.. 1-2 tablets by mouth every 8 hours as needed for arthritis 9)  Autolet Lancing Device Misc (Lancet devices) .... Check blood glucose levels 1-3 times a day 10)  Bayer Contour Test Strp (Glucose blood) .... Use to check blood glucose levels 1-3 times a day 11)  Tramadol Hcl 50 Mg Tabs (Tramadol hcl) .... One tablet by mouth two times a day 12)  Aspirin Ec 81 Mg Tbec (Aspirin) .Marland Kitchen.. 1 tablet by mouth daily 13)  Prilosec 20 Mg Cpdr (Omeprazole) .Marland Kitchen.. 1 tab by mouth daily 14)  Hydrochlorothiazide 12.5 Mg Caps (Hydrochlorothiazide) .... Take 1 pill every morning. 15)  Metronidazole 500 Mg Tabs (Metronidazole) .... Take 1 pill every 12 hours for 7 days.  Other Orders: Urinalysis-[facility] (00000)  Patient Instructions: 1)  Come back in 2 weeks for a blood pressure check. 2)  I will call you with results and send in any prescriptions you need.  3)  it was nice to see you today.  Prescriptions: METRONIDAZOLE 500 MG TABS (METRONIDAZOLE) take 1 pill every 12 hours for 7 days.  #14 x 0   Entered and Authorized by:   Jamie Brookes MD   Signed by:   Jamie Brookes MD on 04/05/2010   Method used:   Electronically to        [facility]  [phone]* (retail)       [address]       [place], [place]  [address]       Ph: [phone] or [phone]       Fax: [phone]   RxID:   [id number] HYDROCHLOROTHIAZIDE 12.5 MG  CAPS (HYDROCHLOROTHIAZIDE) take 1 pill every morning.  #31 x 3   Entered and Authorized by:   Jamie Brookes MD   Signed by:   Jamie Brookes MD on 04/05/2010   Method used:   Electronically to        [facility]  [phone]* (retail)       [address]       [place], [place]  [address]       Ph: [phone] or [phone]       Fax: [phone]   RxID:   [id number]   Laboratory Results   Urine Tests  Date/Time Received: April 05, 2010 11:04 AM  Date/Time Reported: April 05, 2010 12:02 PM   Routine Urinalysis   Color: yellow Appearance: Clear Glucose: negative   (Normal Range: Negative) Bilirubin: negative   (Normal Range: Negative) Ketone: negative   (Normal Range: Negative) Spec. Gravity: 1.020   (Normal Range: 1.003-1.035) Blood: trace-intact   (Normal Range: Negative) pH: 5.5   (Normal Range: 5.0-8.0) Protein: negative   (Normal Range: Negative) Urobilinogen: 0.2   (Normal Range: 0-1) Nitrite: negative   (Normal Range: Negative) Leukocyte Esterace: trace   (Normal Range: Negative)  Urine Microscopic Bacteria/HPF: trace Epithelial/HPF: rare Other: 2+ amorphous    Comments: ...............test performed by......Marland KitchenBonnie A. [place], MLS (ASCP)cm  Date/Time Received: April 05, 2010 12:04 PM  Date/Time Reported: April 05, 2010 12:29 PM   Wet Calvin Source: vag WBC/hpf: >20 Bacteria/hpf: 2+  Cocci Clue cells/hpf: none  Negative whiff Yeast/hpf: none Trichomonas/hpf: none Comments: ...............test performed by......Marland KitchenBonnie A. [place], MLS (ASCP)cm

## 2010-08-22 NOTE — Progress Notes (Signed)
Summary: Cough  Phone Note Call from Patient Call back at [facility] Phone [phone]   Caller: Patient Summary of Call: Woke this morning with cough, sneezing, feels hot and cold.  No dyspnea, but fatigued.  Has sore throat, no myalgias/arthralgias.  Has asthma but not wheezing.  Advised APAP for symptoms, okay to get OTC cold meds for relief.  Present to clinic or urgent care of dyspnea, wheeze, or fever. Initial call taken by: Romero Belling MD,  August 20, 2009 3:51 PM

## 2010-08-22 NOTE — Assessment & Plan Note (Signed)
Summary: flu shot,tcb  Nurse Visit   Vital Signs:  Patient profile:   55 year old female Temp:     98.8 degrees F  Vitals Entered By: Theresia Lo RN (April 25, 2010 9:55 AM)  Allergies: 1)  ! Hydrocodone-Acetaminophen (Hydrocodone-Acetaminophen) 2)  Tetracycline  Immunizations Administered:  Influenza Vaccine # 1:    Vaccine Type: Fluvax MCR    Site: left deltoid    Mfr: GlaxoSmithKline    Dose: 0.5 ml    Route: IM    Given by: Theresia Lo RN    Exp. Date: 01/17/2011    Lot #: [id number]    VIS given: 02/14/10 version given April 25, 2010.  Flu Vaccine Consent Questions:    Do you have a history of severe allergic reactions to this vaccine? no    Any prior history of allergic reactions to egg and/or gelatin? no    Do you have a sensitivity to the preservative Thimersol? no    Do you have a past history of Guillan-Barre Syndrome? no    Do you currently have an acute febrile illness? no    Have you ever had a severe reaction to latex? no    Vaccine information given and explained to patient? yes    Are you currently pregnant? no  Orders Added: 1)  Influenza Vaccine MCR [00025] 2)  Administration Flu vaccine - MCR [G0008]

## 2010-08-22 NOTE — Miscellaneous (Signed)
Summary: Rx Form  Patient dropped off form to be filled out for prescriptions.  Please fax when completed. Bradly Bienenstock  June 01, 2010 3:04 PM  Form completed and placed in Dr. Rodena Medin box for signature.  Terese Door  June 01, 2010 4:36 PM   Form completed and placed in "fax" box to be faxed back  Jamie Brookes MD  June 05, 2010 5:28 PM

## 2010-08-24 NOTE — Miscellaneous (Signed)
Summary: asthma QI  Clinical Lists Changes  Problems: Removed problem of NAUSEA AND VOMITING (ICD-787.01) Changed problem from ASTHMA, INTERMITTENT, MILD (ICD-493.90) to ASTHMA, PERSISTENT (ICD-493.90)

## 2010-08-24 NOTE — Assessment & Plan Note (Signed)
Summary: left ear pain   Vital Signs:  Patient profile:   55 year old female Weight:      301 pounds Temp:     98.3 degrees F oral Pulse rate:   82 / minute BP sitting:   161 / 88  Vitals Entered By: Loralee Pacas CMA (August 15, 2010 3:11 PM) CC: left ear pain x 1 day   Primary Care Provider:  Jamie Brookes, MD   CC:  left ear pain x 1 day.  History of Present Illness: Left ear pain: Pt has been having some left ear pain for the last 1 day. She is having pain that also goes down her neck  along her SCM. She can not remember doing anything strenuous to her neck to cause this pain. She says it's possible that she slept wrong causing the neck pain. She has not had any fever or chills with this, no difficulty swallowing.    Stress: She does mention that she is very stressed after the death of her mother and because her siblings are calling all the time wanting her mom's things and wondering why some of them were left out of parts of the will.   Current Medications (verified): 1)  Allegra 180 Mg Tabs (Fexofenadine Hcl) .... Take 1 Tablet By Mouth Once A Day 2)  Fluticasone Propionate 50 Mcg/act Susp (Fluticasone Propionate) .... Spray 1 Spray Into Both Nostrils Twice A Day 3)  Levothyroxine Sodium 112 Mcg Tabs (Levothyroxine Sodium) .... Take 1 Tablet By Mouth Once A Day For Hypothyroid 4)  Simvastatin 40 Mg Tabs (Simvastatin) .... One Tablet By Mouth At Bedtime For Cholesterol 5)  Metformin Hcl 850 Mg  Tabs (Metformin Hcl) .... One Tablet Daily. 6)  Proair Hfa 108 (90 Base) Mcg/act Aers (Albuterol Sulfate) .... 2 Puffs Q4h As Needed For Wheezing 7)  Flexeril 10 Mg  Tabs (Cyclobenzaprine Hcl) .... Two Times A Day Prn 8)  Tylenol Extra Strength 500 Mg Tabs (Acetaminophen) .Marland Kitchen.. 1-2 Tablets By Mouth Every 8 Hours As Needed For Arthritis 9)  Autolet Lancing Device  Misc (Lancet Devices) .... Check Blood Glucose Levels 1-3 Times A Day 10)  Bayer Contour Test  Strp (Glucose Blood) ....  Use To Check Blood Glucose Levels 1-3 Times A Day 11)  Tramadol Hcl 50 Mg Tabs (Tramadol Hcl) .... One Tablet By Mouth Two Times A Day 12)  Aspirin Ec 81 Mg Tbec (Aspirin) .Marland Kitchen.. 1 Tablet By Mouth Daily 13)  Prilosec 20 Mg Cpdr (Omeprazole) .Marland Kitchen.. 1 Tab By Mouth Daily 14)  Hydrochlorothiazide 12.5 Mg Caps (Hydrochlorothiazide) .... Take 1 Pill Every Morning. 15)  Ondansetron Hcl 8 Mg Tabs (Ondansetron Hcl) .... One Three Times A Day As Needed For Nausea and Vomiting 16)  Auralgan 1.4-5.5 % Soln (Benzocaine-Antipyrine) .... Place 2-4 Drops in The Left Ear 4 Times A Day As Needed For Ear Pain 1 Small Bottle  Allergies (verified): 1)  ! Hydrocodone-Acetaminophen (Hydrocodone-Acetaminophen) 2)  Tetracycline  Review of Systems        vitals reviewed and pertinent negatives and positives seen in HPI   Physical Exam  General:  Well-developed,well-nourished,in no acute distress; alert,appropriate and cooperative throughout examination Head:  Normocephalic and atraumatic without obvious abnormalities. No apparent alopecia or balding. Ears:  External ear exam shows no significant lesions or deformities.  Otoscopic examination reveals clear canals, tympanic membranes are intact bilaterally without bulging, retraction, inflammation or discharge. Hearing is grossly normal bilaterally. Neck:  left SCM tender to palpation, no erythema, no  masses or bulges.    Impression & Recommendations:  Problem # 1:  EAR PAIN, LEFT (ICD-388.70) Assessment New Pt did not have any signs of AOM or external otits but does appear quite tender along the SCM. Suggested hot compresses to the neck, continuing allergy medicine and auralgan if her ear hurts inside.   Her updated medication list for this problem includes:    Auralgan 1.4-5.5 % Soln (Benzocaine-antipyrine) .Marland Kitchen... Place 2-4 drops in the left ear 4 times a day as needed for ear pain 1 small bottle  Orders: [facility]- Est Level  3 (99213)  Complete Medication  List: 1)  Allegra 180 Mg Tabs (Fexofenadine hcl) .... Take 1 tablet by mouth once a day 2)  Fluticasone Propionate 50 Mcg/act Susp (Fluticasone propionate) .... Spray 1 spray into both nostrils twice a day 3)  Levothyroxine Sodium 112 Mcg Tabs (Levothyroxine sodium) .... Take 1 tablet by mouth once a day for hypothyroid 4)  Simvastatin 40 Mg Tabs (Simvastatin) .... One tablet by mouth at bedtime for cholesterol 5)  Metformin Hcl 850 Mg Tabs (Metformin hcl) .... One tablet daily. 6)  Proair Hfa 108 (90 Base) Mcg/act Aers (Albuterol sulfate) .... 2 puffs q4h as needed for wheezing 7)  Flexeril 10 Mg Tabs (Cyclobenzaprine hcl) .... Two times a day prn 8)  Tylenol Extra Strength 500 Mg Tabs (Acetaminophen) .Marland Kitchen.. 1-2 tablets by mouth every 8 hours as needed for arthritis 9)  Autolet Lancing Device Misc (Lancet devices) .... Check blood glucose levels 1-3 times a day 10)  Bayer Contour Test Strp (Glucose blood) .... Use to check blood glucose levels 1-3 times a day 11)  Tramadol Hcl 50 Mg Tabs (Tramadol hcl) .... One tablet by mouth two times a day 12)  Aspirin Ec 81 Mg Tbec (Aspirin) .Marland Kitchen.. 1 tablet by mouth daily 13)  Prilosec 20 Mg Cpdr (Omeprazole) .Marland Kitchen.. 1 tab by mouth daily 14)  Hydrochlorothiazide 12.5 Mg Caps (Hydrochlorothiazide) .... Take 1 pill every morning. 15)  Ondansetron Hcl 8 Mg Tabs (Ondansetron hcl) .... One three times a day as needed for nausea and vomiting 16)  Auralgan 1.4-5.5 % Soln (Benzocaine-antipyrine) .... Place 2-4 drops in the left ear 4 times a day as needed for ear pain 1 small bottle  Patient Instructions: 1)  Do the warm compresses 3-4 times a day.  2)  Use the ear drops to numb up your ear.  Prescriptions: AURALGAN 1.4-5.5 % SOLN (BENZOCAINE-ANTIPYRINE) place 2-4 drops in the left ear 4 times a day as needed for ear pain 1 small bottle  #1 x 0   Entered and Authorized by:   Jamie Brookes MD   Signed by:   Jamie Brookes MD on 08/15/2010   Method used:    Electronically to        [facility]  [phone]* (retail)       [address]       [place], [place]  [address]       Ph: [phone] or [phone]       Fax: [phone]   RxID:   [id number]    Orders Added: 1)  [facility]- Est Level  3 [99213]  Appended Document: left ear pain Hig BP noted but unusual for her so no adjustments made at this time.

## 2010-08-28 ENCOUNTER — Encounter: Payer: Self-pay | Admitting: *Deleted

## 2010-08-28 ENCOUNTER — Encounter: Payer: Self-pay | Admitting: Family Medicine

## 2010-08-28 ENCOUNTER — Other Ambulatory Visit: Payer: Self-pay | Admitting: Family Medicine

## 2010-08-28 ENCOUNTER — Ambulatory Visit (INDEPENDENT_AMBULATORY_CARE_PROVIDER_SITE_OTHER): Payer: MEDICARE | Admitting: Family Medicine

## 2010-08-28 DIAGNOSIS — R5381 Other malaise: Secondary | ICD-10-CM | POA: Insufficient documentation

## 2010-08-28 DIAGNOSIS — R5383 Other fatigue: Secondary | ICD-10-CM

## 2010-08-28 DIAGNOSIS — R634 Abnormal weight loss: Secondary | ICD-10-CM | POA: Insufficient documentation

## 2010-08-28 DIAGNOSIS — I10 Essential (primary) hypertension: Secondary | ICD-10-CM

## 2010-08-29 ENCOUNTER — Other Ambulatory Visit: Payer: Self-pay | Admitting: Family Medicine

## 2010-08-29 ENCOUNTER — Telehealth: Payer: Self-pay | Admitting: Family Medicine

## 2010-08-29 LAB — CBC
HCT: 40 % (ref 36.0–46.0)
Hemoglobin: 12.7 g/dL (ref 12.0–15.0)
MCHC: 31.8 g/dL (ref 30.0–36.0)
MCV: 82 fL (ref 78.0–100.0)

## 2010-08-29 LAB — CONVERTED CEMR LAB
HCT: 40 % (ref 36.0–46.0)
Hemoglobin: 12.7 g/dL (ref 12.0–15.0)
Platelets: 250 10*3/uL (ref 150–400)
RBC: 4.88 M/uL (ref 3.87–5.11)
WBC: 5.7 10*3/uL (ref 4.0–10.5)

## 2010-08-29 LAB — TSH: TSH: 1.163 u[IU]/mL (ref 0.350–4.500)

## 2010-09-07 NOTE — Miscellaneous (Signed)
Summary: triage call  Clinical Lists Changes  patient states she just took her BP and it is 130/93. she checks it several times while we are talking and explained to patient about pumping up cuff several times repeated will give her an incorrect reading. she has a headache and wonders since she only took HCTZ 12.5 MG this AM should she take another 12.5 mg now. consulted Dr. Clotilde Dieter and advised to start tomorrow with HCTZ 25 mg and not take 12.5 now. patient states she has a headache . MD advised to take tylenol. Theresia Lo RN  August 28, 2010 4:38 PM

## 2010-09-07 NOTE — Assessment & Plan Note (Signed)
Summary: WEight loss, HTN, fatigue   Vital Signs:  Patient profile:   55 year old female Height:      71 inches Weight:      296.6 pounds Temp:     98.1 degrees F oral Pulse rate:   75 / minute BP sitting:   134 / 77  (left arm) Cuff size:   large  Vitals Entered By: Jimmy Footman, CMA (August 28, 2010 1:36 PM) CC: weight loss, HTN Is Patient Diabetic? Yes Did you bring your meter with you today? No Pain Assessment Patient in pain? no        Primary Care Provider:  Jamie Brookes, MD   CC:  weight loss and HTN.  History of Present Illness: Weight loss: Pt has lost 5 lbs in the last 2 weeks by walking, drinking more water, has been working on her refridgerator the last few days as it has not been working properly. Pt is very excited about the weight loss.    Hypertension: Pt was recently started on HCTZ 12.5 mg. She has been doing really well with the BP. She is checking it at home and says it has been in the 120s and 130s. She wants it to be just a little bit lower. I agree. Her last BP in the office was 161/77 and is now 134/77.   Fatigue: Pt has been feeling some fatigue lately. she is wondering if her thyroid level is normal. It has not been checked since 2010. She is wondering if low thryoid makes her hair fall out. Pt also has had a lot of things going on in her life with her mothers recent death.   Habits & Providers  Alcohol-Tobacco-Diet     Alcohol drinks/day: 0     Tobacco Status: never     Diet Comments: diabetic  Current Medications (verified): 1)  Allegra 180 Mg Tabs (Fexofenadine Hcl) .... Take 1 Tablet By Mouth Once A Day 2)  Levothyroxine Sodium 112 Mcg Tabs (Levothyroxine Sodium) .... Take 1 Tablet By Mouth Once A Day For Hypothyroid 3)  Simvastatin 40 Mg Tabs (Simvastatin) .... One Tablet By Mouth At Bedtime For Cholesterol 4)  Metformin Hcl 850 Mg  Tabs (Metformin Hcl) .... One Tablet Daily. 5)  Flexeril 10 Mg  Tabs (Cyclobenzaprine Hcl) .... Two  Times A Day Prn 6)  Tylenol Extra Strength 500 Mg Tabs (Acetaminophen) .Marland Kitchen.. 1-2 Tablets By Mouth Every 8 Hours As Needed For Arthritis 7)  Autolet Lancing Device  Misc (Lancet Devices) .... Check Blood Glucose Levels 1-3 Times A Day 8)  Bayer Contour Test  Strp (Glucose Blood) .... Use To Check Blood Glucose Levels 1-3 Times A Day 9)  Tramadol Hcl 50 Mg Tabs (Tramadol Hcl) .... One Tablet By Mouth Two Times A Day 10)  Aspirin Ec 81 Mg Tbec (Aspirin) .Marland Kitchen.. 1 Tablet By Mouth Daily 11)  Prilosec 20 Mg Cpdr (Omeprazole) .Marland Kitchen.. 1 Tab By Mouth Daily 12)  Hydrochlorothiazide 25 Mg Tabs (Hydrochlorothiazide) .... Take 1 Pill Daily in The Morning 13)  Calcium 1200-1000 Mg-Unit Chew (Calcium Carbonate-Vit D-Min) .... Take Ca 1200 Mg Daily  Allergies (verified): 1)  ! Hydrocodone-Acetaminophen (Hydrocodone-Acetaminophen) 2)  Tetracycline  Review of Systems        vitals reviewed and pertinent negatives and positives seen in HPI   Physical Exam  General:  Well-developed,well-nourished,in no acute distress; alert,appropriate and cooperative throughout examination Head:  normocephalic and atraumatic.   Eyes:  vision grossly intact.   Ears:  no  external deformities.   Nose:  no external deformity.   Lungs:  normal respiratory effort and no accessory muscle use.   Psych:  Cognition and judgment appear intact. Alert and cooperative with normal attention span and concentration. No apparent delusions, illusions, hallucinations   Impression & Recommendations:  Problem # 1:  WEIGHT LOSS, RECENT (ICD-783.21) Assessment New Pt has recently lost 5 lbs of weight. She is thrilled about her weight loss. Encouraged her to exercise and eat more fiber. Given Fiber handout today. Will follow up in 1-2 months.   Orders: [facility]- Est  Level 4 ([address]109)  Problem # 2:  HYPERTENSION, BENIGN SYSTEMIC (ICD-401.1) Assessment: Improved Pt is doing well on her BP med. Her BP is much better controlled today. Will increase  from 12.5 to 25 mg of HCTZ today.   Her updated medication list for this problem includes:    Hydrochlorothiazide 25 Mg Tabs (Hydrochlorothiazide) .Marland Kitchen... Take 1 pill daily in the morning  Orders: [facility]- Est  Level 4 (60454)  Problem # 3:  FATIGUE (ICD-780.79) Assessment: New Pt has been feeling fatigued lately. Plan to test TSH and CBC. Has not had her TSH checked in 2 years. Will call pt with results  Orders: TSH-[facility] (09811-91478) CBC-[facility] (29562) [facility]- Est  Level 4 (13086)  Complete Medication List: 1)  Allegra 180 Mg Tabs (Fexofenadine hcl) .... Take 1 tablet by mouth once a day 2)  Levothyroxine Sodium 112 Mcg Tabs (Levothyroxine sodium) .... Take 1 tablet by mouth once a day for hypothyroid 3)  Simvastatin 40 Mg Tabs (Simvastatin) .... One tablet by mouth at bedtime for cholesterol 4)  Metformin Hcl 850 Mg Tabs (Metformin hcl) .... One tablet daily. 5)  Flexeril 10 Mg Tabs (Cyclobenzaprine hcl) .... Two times a day prn 6)  Tylenol Extra Strength 500 Mg Tabs (Acetaminophen) .Marland Kitchen.. 1-2 tablets by mouth every 8 hours as needed for arthritis 7)  Autolet Lancing Device Misc (Lancet devices) .... Check blood glucose levels 1-3 times a day 8)  Bayer Contour Test Strp (Glucose blood) .... Use to check blood glucose levels 1-3 times a day 9)  Tramadol Hcl 50 Mg Tabs (Tramadol hcl) .... One tablet by mouth two times a day 10)  Aspirin Ec 81 Mg Tbec (Aspirin) .Marland Kitchen.. 1 tablet by mouth daily 11)  Prilosec 20 Mg Cpdr (Omeprazole) .Marland Kitchen.. 1 tab by mouth daily 12)  Hydrochlorothiazide 25 Mg Tabs (Hydrochlorothiazide) .... Take 1 pill daily in the morning 13)  Calcium 1200-1000 Mg-unit Chew (Calcium carbonate-vit d-min) .... Take ca 1200 mg daily  Patient Instructions: 1)  Your BP was 161/85 at your last visit and it is 134/77 today.  2)  Great job! Keep up the good work and start the new BP medicine dose and I think you will have it in the perfect range.  3)  Congratulations on your weight loss. You have  lost 5 lbs in the last 2 weeks. Keep exercising and try to eat foods with lots of fiber.  4)  You got a fiber list today.  5)  I want to see you in 1-2 months and then in May you need to get fasting labs. 6)  Today we are going to test your Thyroid and CBC. My office will call with results.  Prescriptions: HYDROCHLOROTHIAZIDE 25 MG TABS (HYDROCHLOROTHIAZIDE) take 1 pill daily in the morning  #31 x 4   Entered and Authorized by:   Jamie Brookes MD   Signed by:   Jamie Brookes MD on 08/28/2010  Method used:   Electronically to        [facility]  [phone]* (retail)       [address]       [place], [place]  [address]       Ph: [phone] or [phone]       Fax: [phone]   RxID:   [id number] TRAMADOL HCL 50 MG TABS (TRAMADOL HCL) one tablet by mouth two times a day  #60 x 3   Entered and Authorized by:   Jamie Brookes MD   Signed by:   Jamie Brookes MD on 08/28/2010   Method used:   Electronically to        [facility]  [phone]* (retail)       [address]       [place], [place]  [address]       Ph: [phone] or [phone]       Fax: [phone]   RxID:   [id number]    Orders Added: 1)  TSH-[facility] [[id number]] 2)  CBC-[facility] [85027] 3)  [facility]- Est  Level 4 [83151]

## 2010-09-07 NOTE — Progress Notes (Signed)
Summary: Referral for DEXA scan  Phone Note Call from Patient Call back at Home Phone [phone]   Reason for Call: Referral Summary of Call[facility] so pt needs [place] to send a referral for a bone scan.  Initial call taken by: Knox Royalty,  August 29, 2010 11:14 AM  New Problems: SPECIAL SCREENING FOR OSTEOPOROSIS (ICD-V82.81)   New Problems: SPECIAL SCREENING FOR OSTEOPOROSIS (ICD-V82.81)  Appended Document: Referral for DEXA scan appt made pt informed

## 2010-09-11 ENCOUNTER — Ambulatory Visit (HOSPITAL_COMMUNITY)
Admission: RE | Admit: 2010-09-11 | Discharge: 2010-09-11 | Disposition: A | Discharge status: Home / Self Care | Payer: Medicare Other | Source: Ambulatory Visit | Attending: Family Medicine | Admitting: Family Medicine

## 2010-09-11 DIAGNOSIS — Z13820 Encounter for screening for osteoporosis: Secondary | ICD-10-CM | POA: Insufficient documentation

## 2010-09-11 DIAGNOSIS — Z780 Asymptomatic menopausal state: Secondary | ICD-10-CM | POA: Insufficient documentation

## 2010-09-14 ENCOUNTER — Telehealth: Payer: Self-pay | Admitting: Family Medicine

## 2010-09-14 NOTE — Telephone Encounter (Signed)
Asking for bone density results

## 2010-09-14 NOTE — Telephone Encounter (Signed)
Patient notified.Logan Bores, Roselyn Meier

## 2010-09-14 NOTE — Telephone Encounter (Signed)
Please let Ms. Cheong know that we don't have the report back yet. We'll let her know when we get it. Thanks.

## 2010-09-15 ENCOUNTER — Telehealth: Payer: Self-pay | Admitting: Family Medicine

## 2010-09-15 NOTE — Telephone Encounter (Signed)
Advised pt to try Mucinex.  Anything else she needs to run by the pharmacist first to ensure low sugar content.

## 2010-09-15 NOTE — Telephone Encounter (Signed)
Returning call.

## 2010-09-15 NOTE — Telephone Encounter (Signed)
Pt has a cough & wants to know what she can take otc since she has diabetes

## 2010-09-15 NOTE — Telephone Encounter (Signed)
Returned her call.  No answer.  Left VMM to call us back. 

## 2010-09-20 ENCOUNTER — Ambulatory Visit (INDEPENDENT_AMBULATORY_CARE_PROVIDER_SITE_OTHER): Payer: MEDICARE | Admitting: Family Medicine

## 2010-09-20 ENCOUNTER — Telehealth: Payer: Self-pay | Admitting: Family Medicine

## 2010-09-20 VITALS — BP 118/84 | HR 80 | Temp 98.1°F | Ht 70.25 in | Wt 295.8 lb

## 2010-09-20 DIAGNOSIS — J329 Chronic sinusitis, unspecified: Secondary | ICD-10-CM

## 2010-09-20 DIAGNOSIS — J0100 Acute maxillary sinusitis, unspecified: Secondary | ICD-10-CM

## 2010-09-20 DIAGNOSIS — A499 Bacterial infection, unspecified: Secondary | ICD-10-CM

## 2010-09-20 DIAGNOSIS — B9689 Other specified bacterial agents as the cause of diseases classified elsewhere: Secondary | ICD-10-CM

## 2010-09-20 MED ORDER — AZITHROMYCIN 250 MG PO TABS: ORAL_TABLET | ORAL | Status: AC

## 2010-09-20 NOTE — Telephone Encounter (Signed)
Pt calling again about rx, pt was prescribed this today by Dr. Lelon Perla, would like to pick up today.

## 2010-09-20 NOTE — Telephone Encounter (Signed)
Brianna Wheeler looked in this pt's meds and there is not an Rx for the medication that she is requesting.Brianna Wheeler

## 2010-09-20 NOTE — Telephone Encounter (Signed)
Pt checking status of rx for tesslon pearls, pharmacy has only received the abx, pt goes to [facility].

## 2010-09-20 NOTE — Progress Notes (Signed)
  Subjective:     Brianna Wheeler is a 55 y.o. female who presents for evaluation of sinus pain. Symptoms include: congestion, cough, facial pain, headaches, nasal congestion and sinus pressure. Onset of symptoms was 5 days ago. Symptoms have been gradually worsening since that time. Past history is significant for recurrent sinusitis. Patient is a non-smoker.  The following portions of the patient's history were reviewed and updated as appropriate: allergies, current medications, past medical history and problem list.  Review of Systems Constitutional: negative for chills, fatigue, fevers and night sweats Eyes: negative for irritation, redness and visual disturbance Ears, nose, mouth, throat, and face: negative for ear drainage and earaches Respiratory: negative for dyspnea on exertion, hemoptysis and pleurisy/chest pain Cardiovascular: negative for chest pain   Objective:    General appearance: alert, cooperative and no distress Head: Normocephalic, without obvious abnormality, atraumatic Eyes: conjunctivae/corneas clear. PERRL, EOM's intact. Fundi benign. Ears: normal TM's and external ear canals both ears Nose: sinus tenderness bilateral Throat: lips, mucosa, and tongue normal; teeth and gums normal Neck: no adenopathy, no carotid bruit, no JVD, supple, symmetrical, trachea midline and thyroid not enlarged, symmetric, no tenderness/mass/nodules Lungs: clear to auscultation bilaterally Heart: regular rate and rhythm, S1, S2 normal, no murmur, click, rub or gallop    Assessment:    Acute bacterial sinusitis.    Plan:    Azithromycin per medication orders.

## 2010-09-20 NOTE — Patient Instructions (Signed)
Place sinusitis patient instructions here.

## 2010-09-21 MED ORDER — BENZONATATE 100 MG PO CAPS: 100.0000 mg | ORAL_CAPSULE | Freq: Four times a day (QID) | ORAL | Status: DC | PRN

## 2010-09-21 NOTE — Telephone Encounter (Signed)
Rx sent 

## 2010-09-22 ENCOUNTER — Telehealth: Payer: Self-pay | Admitting: Family Medicine

## 2010-09-22 NOTE — Telephone Encounter (Signed)
Had a bone density test and would like results.

## 2010-09-25 ENCOUNTER — Telehealth: Payer: Self-pay | Admitting: Family Medicine

## 2010-09-25 NOTE — Telephone Encounter (Signed)
Had bone density test on 2/20 and hasn't heard results yet.

## 2010-09-27 NOTE — Telephone Encounter (Signed)
Informed patient of message.  

## 2010-09-27 NOTE — Telephone Encounter (Signed)
Let her know that i have checked all our computerized systems several times and couldn't find the result. I called over there today and they just faxed me over a copy. The DEXA scan results were normal based on the [facility]. Hope this helps!

## 2010-10-08 LAB — GLUCOSE, CAPILLARY
Glucose-Capillary: 111 mg/dL — ABNORMAL HIGH (ref 70–99)
Glucose-Capillary: 121 mg/dL — ABNORMAL HIGH (ref 70–99)
Glucose-Capillary: 94 mg/dL (ref 70–99)

## 2010-10-08 LAB — CK TOTAL AND CKMB (NOT AT ARMC)
CK, MB: 0.6 ng/mL (ref 0.3–4.0)
Relative Index: INVALID (ref 0.0–2.5)
Total CK: 51 U/L (ref 7–177)

## 2010-10-08 LAB — POCT CARDIAC MARKERS
CKMB, poc: <1 ng/mL — ABNORMAL LOW (ref 1.0–8.0)
CKMB, poc: <1 ng/mL — ABNORMAL LOW (ref 1.0–8.0)
CKMB, poc: <1 ng/mL — ABNORMAL LOW (ref 1.0–8.0)
Myoglobin, poc: 59.6 ng/mL (ref 12–200)
Myoglobin, poc: 63.1 ng/mL (ref 12–200)
Myoglobin, poc: 64.1 ng/mL (ref 12–200)
Troponin i, poc: <0.05 ng/mL (ref 0.00–0.09)
Troponin i, poc: <0.05 ng/mL (ref 0.00–0.09)
Troponin i, poc: <0.05 ng/mL (ref 0.00–0.09)

## 2010-10-08 LAB — POCT I-STAT, CHEM 8
BUN: 10 mg/dL (ref 6–23)
Calcium, Ion: 1.15 mmol/L (ref 1.12–1.32)
Chloride: 106 meq/L (ref 96–112)
Creatinine, Ser: 0.7 mg/dL (ref 0.4–1.2)
Glucose, Bld: 97 mg/dL (ref 70–99)
HCT: 41 % (ref 36.0–46.0)
Hemoglobin: 13.9 g/dL (ref 12.0–15.0)
Potassium: 4.3 meq/L (ref 3.5–5.1)
Sodium: 139 mEq/L (ref 135–145)
TCO2: 24 mmol/L (ref 0–100)

## 2010-10-08 LAB — DIFFERENTIAL
Eosinophils Absolute: 0.2 10*3/uL (ref 0.0–0.7)
Lymphs Abs: 2.5 10*3/uL (ref 0.7–4.0)
Monocytes Relative: 10 % (ref 3–12)
Neutrophils Relative %: 56 % (ref 43–77)

## 2010-10-08 LAB — RAPID URINE DRUG SCREEN, HOSP PERFORMED
Cocaine: NOT DETECTED
Opiates: NOT DETECTED
Tetrahydrocannabinol: NOT DETECTED

## 2010-10-08 LAB — TROPONIN I: Troponin I: <0.01 ng/mL (ref 0.00–0.06)

## 2010-10-08 LAB — CBC
HCT: 38.7 % (ref 36.0–46.0)
Hemoglobin: 13 g/dL (ref 12.0–15.0)
MCH: 28.5 pg (ref 26.0–34.0)
RBC: 4.56 MIL/uL (ref 3.87–5.11)

## 2010-10-08 LAB — CARDIAC PANEL(CRET KIN+CKTOT+MB+TROPI)
CK, MB: 0.6 ng/mL (ref 0.3–4.0)
Relative Index: INVALID (ref 0.0–2.5)
Total CK: 56 U/L (ref 7–177)

## 2010-10-08 LAB — TSH: TSH: 0.728 u[IU]/mL (ref 0.350–4.500)

## 2010-10-24 ENCOUNTER — Ambulatory Visit (INDEPENDENT_AMBULATORY_CARE_PROVIDER_SITE_OTHER): Payer: Medicare Other | Admitting: Family Medicine

## 2010-10-24 ENCOUNTER — Encounter: Payer: Self-pay | Admitting: Family Medicine

## 2010-10-24 VITALS — BP 120/65 | HR 82 | Temp 98.2°F | Ht 71.0 in | Wt 294.2 lb

## 2010-10-24 DIAGNOSIS — R300 Dysuria: Secondary | ICD-10-CM

## 2010-10-24 DIAGNOSIS — N390 Urinary tract infection, site not specified: Secondary | ICD-10-CM | POA: Insufficient documentation

## 2010-10-24 LAB — POCT URINALYSIS DIPSTICK
Bilirubin, UA: NEGATIVE
Glucose, UA: NEGATIVE
Spec Grav, UA: 1.02
pH, UA: 6

## 2010-10-24 LAB — POCT UA - MICROSCOPIC ONLY

## 2010-10-24 MED ORDER — CEPHALEXIN 500 MG PO CAPS: 500.0000 mg | ORAL_CAPSULE | Freq: Two times a day (BID) | ORAL | Status: DC

## 2010-10-24 NOTE — Patient Instructions (Signed)
Keflex- antibiotic for urinary tract infection- take all 7 days Call office if worsening pain, fever, nausea/vomiting. You have a lot of stress in your life and everyone needs help from time to time.  Consider making appt with Dr. Clotilde Dieter to discuss therapy and/or medication.

## 2010-10-24 NOTE — Progress Notes (Signed)
  Subjective:    Patient ID: Brianna Wheeler, female    DOB: 1956/01/25, 55 y.o.   MRN: [id number]  HPI DYSURIA  Onset: 5 days   Worsening: no    Symptoms Urgency: yes  Frequency: yes  Hesitancy: no  Hematuria: no  Flank Pain: yes  Fever: no   Highest Temp:no  Nausea/Vomiting: no  Pregnant: no STD exposure/history: no Discharge: no Irritants: no Rash: no  Red Flags  : (Risk Factors for Complicated UTI) Recent Antibiotic Usage (last 30 days): no  Symptoms lasting more than seven (7) days: no  More than 3 UTI's last 12 months: no  PMH of  1. DM: yes 2. Renal Disease/Calculi: no 3. Urinary Tract Abnormality: no  4. Instrumentation/Trauma: no 5. Immunosuppression: no  Anxiety/Depression.  No SI, HI.  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  Notes some crying, no hopelessness.  Has discussed with her PCP in the past, did not feel like medications were warranted at that time.     Review of Systems See hpi    Objective:   Physical Exam  Constitutional: She appears well-developed and well-nourished.  Cardiovascular: Normal rate and regular rhythm.   Pulmonary/Chest: Effort normal and breath sounds normal. No respiratory distress. She has no wheezes. She has no rales.  Abdominal: Soft. She exhibits no distension. There is no tenderness. There is no rebound and no guarding.       Minimal right flank vs low back pain          Assessment & Plan:

## 2010-10-24 NOTE — Assessment & Plan Note (Addendum)
No recent abx treatment or history of frequent UTI.  Symptoms of burning, frequency.  States DM has been well controlled with glucoses in low 100's.  Has chronic low back pain with some right flank pain she feels is new, but otherwise well.  Will treat with keflex, send urine for culture.

## 2010-10-25 ENCOUNTER — Telehealth: Payer: Self-pay | Admitting: Family Medicine

## 2010-10-25 NOTE — Telephone Encounter (Signed)
Mrs. Menzel reporting that medicine given for antibiotic is causing an allergic rx.  She has been nauseated all day.  Want to know if something else can be called in for problem

## 2010-10-25 NOTE — Telephone Encounter (Addendum)
She has been nauseated all day , no vomiting , just feels a little nauseated . Taking with food .will send message to MD.  spoke with Dr. Earnest Bailey and she does not want to change antibiotic now until culture  report comes back.   Advised for patient to continue to try to take and if really bothering her call back tomorrow when report should be available. Patient agreeable.

## 2010-10-27 LAB — CULTURE, URINE COMPREHENSIVE: Colony Count: 25000

## 2010-11-22 ENCOUNTER — Encounter: Payer: Self-pay | Admitting: Family Medicine

## 2010-12-04 ENCOUNTER — Ambulatory Visit (INDEPENDENT_AMBULATORY_CARE_PROVIDER_SITE_OTHER): Payer: Medicare Other | Admitting: Family Medicine

## 2010-12-04 ENCOUNTER — Encounter: Payer: Self-pay | Admitting: Family Medicine

## 2010-12-04 VITALS — BP 127/87 | HR 75 | Temp 98.5°F | Wt 293.4 lb

## 2010-12-04 DIAGNOSIS — H6980 Other specified disorders of Eustachian tube, unspecified ear: Secondary | ICD-10-CM | POA: Insufficient documentation

## 2010-12-04 DIAGNOSIS — H6990 Unspecified Eustachian tube disorder, unspecified ear: Secondary | ICD-10-CM | POA: Insufficient documentation

## 2010-12-04 NOTE — Assessment & Plan Note (Signed)
Likely secondary to viral URI. Continue Flonase. Advised regarding symptomatic management. Follow up as needed.

## 2010-12-04 NOTE — Patient Instructions (Signed)
Follow up as needed.  Continue to use for Flonase and allegra.   - Dr. Wallene Huh

## 2010-12-04 NOTE — Progress Notes (Signed)
  Subjective:     Brianna Wheeler is a 55 y.o. female who presents with ear pain. Symptoms include: left ear pain and plugged sensation in the left ear. Onset of symptoms was today, and have been unchanged since that time. Associated symptoms include: non productive cough, post nasal drip and sinus pressure.  Patient denies: achiness, chills, congestion, fever , headache and sore throat. She is drinking plenty of fluids. Has been taking her Flonase and [name] - has not been around anyone who is sick.   The following portions of the patient's history were reviewed and updated as appropriate: allergies, current medications, past family history, past medical history, past social history, past surgical history and problem list.  Review of Systems Pertinent items are noted in HPI.   Objective:    BP 127/87  Pulse 75  Temp(Src) 98.5 F (36.9 C) (Oral)  Wt 293 lb 6.4 oz (133.085 kg) General: Head:  alert, cooperative and no distress  mild tenderness to palpation at left maxillary sinus  Right Ear: normal landmarks and mobility  Left Ear: normal landmarks and mobility  Mouth:  lips, mucosa, and tongue normal; teeth and gums normal  Neck: no adenopathy and supple, symmetrical, trachea midline     Assessment:     Plan:

## 2010-12-05 ENCOUNTER — Other Ambulatory Visit: Payer: Self-pay | Admitting: Family Medicine

## 2010-12-05 MED ORDER — FLUTICASONE PROPIONATE 50 MCG/ACT NA SUSP: 1.0000 | Freq: Two times a day (BID) | NASAL | Status: DC

## 2010-12-20 ENCOUNTER — Encounter: Payer: Self-pay | Admitting: Family Medicine

## 2010-12-20 ENCOUNTER — Ambulatory Visit (INDEPENDENT_AMBULATORY_CARE_PROVIDER_SITE_OTHER): Payer: Medicare Other | Admitting: Family Medicine

## 2010-12-20 VITALS — BP 120/80 | HR 80 | Temp 98.4°F | Ht 71.0 in | Wt 283.3 lb

## 2010-12-20 DIAGNOSIS — R1110 Vomiting, unspecified: Secondary | ICD-10-CM

## 2010-12-20 MED ORDER — FLUTICASONE PROPIONATE 50 MCG/ACT NA SUSP: 1.0000 | Freq: Two times a day (BID) | NASAL | Status: DC

## 2010-12-20 NOTE — Progress Notes (Signed)
Vomiting: Pt has had some vomiting yesterday for about 3 hours from 1:15 pm o 4:00 pm. She had been working outside in the heat with the plants that had just been sprayed with round-up recently. She was sweating a lot and not drinking fluids. She had some vomiting and belching once she went inside. Her vomit was brown and liquid. No blood, no green fluid. No diarrhea, normal urine. She has no sick contacts. She slept well las tnight and this morning was able to keep down food and liquids.   ROS: neg except as noted in HPI and some nose burning with sinus congestion and nose blowing.   PE:  Gen: resting comfortably in the room.  Ears: no bulging or retraction of TM's.  Mouth: moist mucus membranes, no erytehma in post pharynx CV: rrr, no murmur Pulm: CATB, no wheezes or rales Abd: soft, + BT, no masses, obese

## 2010-12-20 NOTE — Assessment & Plan Note (Signed)
Pt was vomiting yesterday. Has regained ability to keep down fluids and solids.  Suggested drinking Gatorade sips the rest of the day.  CMET today to r/o liver abnormalities and check electrolytes.

## 2010-12-20 NOTE — Patient Instructions (Signed)
We are going to check your electrolytes and liver enzymes today.  Sip on gatorade the rest of the day.

## 2010-12-21 ENCOUNTER — Other Ambulatory Visit: Payer: Self-pay | Admitting: Family Medicine

## 2010-12-21 ENCOUNTER — Telehealth: Payer: Self-pay | Admitting: Family Medicine

## 2010-12-21 LAB — COMPREHENSIVE METABOLIC PANEL
BUN: 12 mg/dL (ref 6–23)
CO2: 31 mEq/L (ref 19–32)
Creat: 0.97 mg/dL (ref 0.40–1.20)
Glucose, Bld: 106 mg/dL — ABNORMAL HIGH (ref 70–99)
Total Bilirubin: 1 mg/dL (ref 0.3–1.2)

## 2010-12-21 MED ORDER — POTASSIUM CHLORIDE ER 10 MEQ PO TBCR: 20.0000 meq | EXTENDED_RELEASE_TABLET | Freq: Once | ORAL | Status: AC

## 2010-12-21 NOTE — Telephone Encounter (Signed)
Called patient again and message left on voicemail from Dr.  Clotilde Dieter form this AM  about using OTC hydrocortisone.

## 2010-12-21 NOTE — Telephone Encounter (Signed)
Was seen yesterday and Strother noticed that she had bumps on her arm.  She took benedryl and she woke up this am and there are more bumps all down her arm and into hand - very itchy and wants to know what else she can take to help this.

## 2010-12-21 NOTE — Telephone Encounter (Signed)
Paged Dr. Clotilde Dieter and she advises for patient to  use hydrocortisone cream four times a day on areas. Called patient and message left to return call.

## 2010-12-22 ENCOUNTER — Telehealth: Payer: Self-pay | Admitting: *Deleted

## 2010-12-22 NOTE — Telephone Encounter (Signed)
Patient calls today stating she has broken out more. Has area on shoulder , arms and now on face up near eye and having much itching. . Advised that if near eye she may need to go to urgent care and not to use cortisone cream on face near eye.,

## 2010-12-24 ENCOUNTER — Telehealth: Payer: Self-pay | Admitting: Family Medicine

## 2010-12-24 MED ORDER — PREDNISONE 50 MG PO TABS: 50.0000 mg | ORAL_TABLET | Freq: Every day | ORAL | Status: AC

## 2010-12-24 MED ORDER — PERMETHRIN 5 % EX CREA: TOPICAL_CREAM | Freq: Once | CUTANEOUS | Status: AC

## 2010-12-24 NOTE — Telephone Encounter (Signed)
Pt has a rash with many skipped lesions on left arm, elbow and neck and face mostly, pt states was doing some flower potting over the weekend and could have been bitten by bugs.  Will give premetherin being it looks like bug bites and prednisone.  Follow up next week if not better.

## 2010-12-24 NOTE — Telephone Encounter (Signed)
Pt calling back sue to having a rash that has seemed to be spreading, saw Dr Lorelee Market recently told to try some hydrocortisone cream on it and see if it would improve, since then has spread to her face around her eye, has been now taking benadryl which has helped with the itch some but has not helped the rash.  Wants to know what else to do.  Told her no blood no vision changes, not an emergency will need to talk to husband anyhow who is admitted so will come and look at rash.

## 2010-12-27 ENCOUNTER — Encounter: Payer: Self-pay | Admitting: Family Medicine

## 2010-12-27 ENCOUNTER — Ambulatory Visit (INDEPENDENT_AMBULATORY_CARE_PROVIDER_SITE_OTHER): Payer: Medicare Other | Admitting: Family Medicine

## 2010-12-27 DIAGNOSIS — R21 Rash and other nonspecific skin eruption: Secondary | ICD-10-CM | POA: Insufficient documentation

## 2010-12-27 DIAGNOSIS — B372 Candidiasis of skin and nail: Secondary | ICD-10-CM | POA: Insufficient documentation

## 2010-12-27 MED ORDER — CLOTRIMAZOLE 1 % EX CREA: TOPICAL_CREAM | Freq: Two times a day (BID) | CUTANEOUS | Status: DC

## 2010-12-27 MED ORDER — PERMETHRIN 5 % EX CREA: TOPICAL_CREAM | Freq: Once | CUTANEOUS | Status: AC

## 2010-12-27 MED ORDER — FLUOCINONIDE 0.05 % EX CREA: TOPICAL_CREAM | Freq: Two times a day (BID) | CUTANEOUS | Status: DC

## 2010-12-27 NOTE — Assessment & Plan Note (Addendum)
Clotrimazole BID, then when resolved, powders and barrier ointments

## 2010-12-27 NOTE — Assessment & Plan Note (Addendum)
Has undergone tx for scabies once although I wonder the efficacy as she may have not been able to cover adequate body surface with amount left in tube.  Also in differential is bedbugs- patient says she has had exterminator service come to her house to evaluate.  Will give more poteent topical steroid for itching and if not resolving, advised retreatment for scabies, re-washing of linens.

## 2010-12-27 NOTE — Progress Notes (Signed)
  Subjective:    Patient ID: Brianna Wheeler, female    DOB: 14-Nov-1955, 55 y.o.   MRN: [id number]  HPI Rash x 1 week Started after spending time outside.  Itchy spots around wrists, flexor surface of arms, umbilicus.  Has been treating with 1% hydrocortisone.  Saw Dr. Katrinka Blazing and he prescribed prednisone x 5 days and permethrin cream.  She brings her permethrin cream tube which is mostly full.  Also has itchiness under breasts and pannus.   Review of Systemsno fevers, chills     Objective:   Physical Exam  Constitutional: She appears well-developed and well-nourished.  Skin:       On bilateral lower abdomen above pannus, erythematous satellite lesions.  Under breasts small erythematous macules appearance to be heat rash.  small papules, some in lineaer distributions around wrists, flexor surfaces of arms.  None interdigitally.          Assessment & Plan:

## 2010-12-27 NOTE — Patient Instructions (Signed)
Use lidex cream on itchy spots Use clotrimazole on the are around you belly that is red.  After this is improved, use a powder to help keep the area dry.  (like tintactin for athletes foot) If not getting better- retreat with permethrin for scabies Follow-up if no improvement  Intertrigo Intertrigo is a skin condition that occurs in between folds of skin in places on the body that rub together a lot and do not get much ventilation. It is caused by heat, moisture, friction, sweat retention, and lack of air circulation, which produces red, irritated patches and, sometimes, scaling or drainage. People who have diabetes, who are obese, or who have treatment with antibiotics are at increased risk for intertrigo. The most common sites for intertrigo to occur include:  The groin.   The breasts.   The armpits.   Folds of abdominal skin.   Webbed spaces between the fingers or toes.  Intertrigo may be aggravated by:  Sweat.   Feces.   Yeast or bacteria that are present near skin folds.   Urine.   Vaginal discharge.  HOME CARE  The following steps can be taken to reduce friction and keep the affected area cool and dry:   Expose skin folds to the air.   Keep deep skin folds separated with cotton or linen cloth. Avoid tight fitting clothing that could cause chafing.   Wear open-toed shoes or sandals to help reduce moisture between the toes.   Apply absorbent powders to affected areas, as directed by your caregiver.   Apply over-the-counter barrier pastes, such as zinc oxide, as directed by your caregiver.   If you develop a fungal infection in the affected area, your caregiver may have you use antifungal creams.  SEEK MEDICAL CARE IF:  The rash is not improving after one week of treatment.   The rash is getting worse (more red, more swollen, more painful, or spreading).   Associated with fever or chills.  MAKE SURE YOU:  Understand these instructions.   Will watch your  condition.   Will get help right away if you are not doing well or get worse.  Document Released: 07/09/2005 Document Re-Released: 12/27/2009 [facility] Patient Information 2011 [place], [place].

## 2011-01-05 ENCOUNTER — Telehealth: Payer: Self-pay | Admitting: Family Medicine

## 2011-01-05 NOTE — Telephone Encounter (Signed)
Brianna Wheeler was stung by 2 bees on her index finger on rt hand.  She was attended to by EMS.  She was given 2 Benydrl tabs.  They told her to contact her provider and request an Epi pen.  Pt want to have this called to her pharmacy

## 2011-01-09 MED ORDER — EPINEPHRINE 0.3 MG/0.3ML IJ DEVI: 0.3000 mg | Freq: Once | INTRAMUSCULAR | Status: DC

## 2011-01-09 NOTE — Telephone Encounter (Signed)
Informed pt that Rx is at Pharmacy.Marland KitchenMarland KitchenMarland KitchenLoralee Pacas [place]

## 2011-01-09 NOTE — Telephone Encounter (Signed)
Done

## 2011-01-15 ENCOUNTER — Telehealth: Payer: Self-pay | Admitting: Family Medicine

## 2011-01-15 NOTE — Telephone Encounter (Signed)
Takes Tramadone for pain.  Had an episode late night needing to lift her husband and has hurt her back.  Wants to know if she should take an extra Tramadone.  May need to be seen but cannot leave her husband alone.

## 2011-01-16 ENCOUNTER — Other Ambulatory Visit: Payer: Self-pay | Admitting: Family Medicine

## 2011-01-16 MED ORDER — HYDROCHLOROTHIAZIDE 12.5 MG PO CAPS: 25.0000 mg | ORAL_CAPSULE | ORAL | Status: DC

## 2011-01-16 MED ORDER — LEVOTHYROXINE SODIUM 112 MCG PO CAPS: 1.0000 | ORAL_CAPSULE | Freq: Every day | ORAL | Status: DC

## 2011-01-16 NOTE — Telephone Encounter (Signed)
I called her and left a message saying that it was OK to take a few extra if she needed to and if she runs out early she needs to call us to let us know. Thanks.

## 2011-02-01 ENCOUNTER — Telehealth: Payer: Self-pay | Admitting: Family Medicine

## 2011-02-01 NOTE — Telephone Encounter (Signed)
Brianna Wheeler is going to contact the pharmacy to request refills on all of her medications.  She doesn't know if she needs to be seen to get the refills.  She would like a call if she is going to need to be scheduled to get them.

## 2011-02-02 NOTE — Telephone Encounter (Signed)
Called and lvm to inform pt that she will need to be seen before any refills on her meds are given. Told her that when she got a chance to call back and make an appt.Marland KitchenMarland KitchenLoralee Pacas [place]

## 2011-02-09 ENCOUNTER — Ambulatory Visit: Payer: Medicare Other | Admitting: Family Medicine

## 2011-02-12 ENCOUNTER — Encounter: Payer: Self-pay | Admitting: Family Medicine

## 2011-02-12 ENCOUNTER — Ambulatory Visit (INDEPENDENT_AMBULATORY_CARE_PROVIDER_SITE_OTHER): Payer: Medicare Other | Admitting: Family Medicine

## 2011-02-12 VITALS — BP 132/82 | HR 73 | Temp 98.4°F | Ht 71.0 in | Wt 282.7 lb

## 2011-02-12 DIAGNOSIS — E119 Type 2 diabetes mellitus without complications: Secondary | ICD-10-CM

## 2011-02-12 DIAGNOSIS — R634 Abnormal weight loss: Secondary | ICD-10-CM

## 2011-02-12 DIAGNOSIS — IMO0002 Reserved for concepts with insufficient information to code with codable children: Secondary | ICD-10-CM

## 2011-02-12 LAB — POCT GLYCOSYLATED HEMOGLOBIN (HGB A1C): Hemoglobin A1C: 5.7

## 2011-02-12 MED ORDER — TRAMADOL HCL 50 MG PO TABS: 50.0000 mg | ORAL_TABLET | Freq: Two times a day (BID) | ORAL | Status: DC

## 2011-02-12 MED ORDER — SIMVASTATIN 40 MG PO TABS: 40.0000 mg | ORAL_TABLET | Freq: Every day | ORAL | Status: DC

## 2011-02-12 MED ORDER — LEVOTHYROXINE SODIUM 112 MCG PO TABS: 112.0000 ug | ORAL_TABLET | Freq: Every day | ORAL | Status: DC

## 2011-02-12 MED ORDER — HYDROCHLOROTHIAZIDE 12.5 MG PO CAPS: 25.0000 mg | ORAL_CAPSULE | ORAL | Status: DC

## 2011-02-12 NOTE — Assessment & Plan Note (Signed)
Much improved.   Will continue Metformin for now as patient tolerates medication and is actively trying to lose more weight.  FU in 3 months

## 2011-02-12 NOTE — Progress Notes (Signed)
  Subjective:    Patient ID: Brianna Wheeler, female    DOB: May 29, 1956, 55 y.o.   MRN: [id number]  HPI Pleasant 55 yo F here for medication refills.  Of note, her husband was diagnosed with advanced lung carcinoma 3 weeks ago and she is having to adjust to this diagnosis.  :    1.  Diabetes:  Currently on Metformin.     No adverse effects from medication.  No hypoglycemic events.  No paresthesia or peripheral nerve pain.  Measures blood sugars at home every:     Lab Results  Component Value Date   HGBA1C 5.7 02/12/2011   2.  Weight loss:  Trying to lose weight.  Has lost about 126 lbs since starting program.  Very excited about how much weight she has lost.  Still attempting to lose weight.  Using exercise and strict diet control.  Wants to eventual get off metformin.    3.  Thyroid:  Not currently having any problems with this.  No heat/cold symptoms.  Weight loss as above.  No concerns per patient.   Lab Results  Component Value Date   TSH 1.163 08/29/2010   4.  Back pain:  Chronic problem for patient stemming from old injury.  Pain BL lumbago.  Feels this is worsening since husband's diagnosis (see above) as she has to do much lifting and transferrring to and from wheelchair.  No incontinence, saddle anesthesia, weakness, paresthesias, fevers or chills.     Review of Systems See HPI above for review of systems.       Objective:   Physical Exam Gen:  Alert, cooperative patient who appears stated age in no acute distress.  Vital signs reviewed. Neck: No masses or thyromegaly or limitation in range of motion.  No cervical lymphadenopathy. Cardiac:  Regular rate and rhythm without murmur auscultated.  Good S1/S2. Pulm:  Clear to auscultation bilaterally with good air movement.  No wheezes or rales noted.   Abd:  Soft/nondistended/nontender.  Good bowel sounds throughout all four quadrants.  No masses noted. Obese Ext:  No clubbing/cyanosis/erythema.  No edema noted bilateral lower  extremities.          Assessment & Plan:

## 2011-02-12 NOTE — Assessment & Plan Note (Signed)
Pain worsening. Will refill tramadol

## 2011-02-12 NOTE — Patient Instructions (Signed)
I've sent in all the refills you need.   Your A1C is 5.7 -- that's great news!!! It was good to meet you today.

## 2011-02-12 NOTE — Assessment & Plan Note (Signed)
Purposeful.  Will follow.

## 2011-04-23 ENCOUNTER — Ambulatory Visit (INDEPENDENT_AMBULATORY_CARE_PROVIDER_SITE_OTHER): Payer: Medicare Other

## 2011-04-23 DIAGNOSIS — Z23 Encounter for immunization: Secondary | ICD-10-CM

## 2011-05-04 ENCOUNTER — Encounter: Payer: Self-pay | Admitting: Family Medicine

## 2011-05-04 ENCOUNTER — Ambulatory Visit (INDEPENDENT_AMBULATORY_CARE_PROVIDER_SITE_OTHER): Payer: Medicare Other | Admitting: Family Medicine

## 2011-05-04 DIAGNOSIS — H6990 Unspecified Eustachian tube disorder, unspecified ear: Secondary | ICD-10-CM

## 2011-05-04 DIAGNOSIS — H6980 Other specified disorders of Eustachian tube, unspecified ear: Secondary | ICD-10-CM

## 2011-05-04 NOTE — Progress Notes (Signed)
  Subjective:    Patient ID: Brianna Wheeler, female    DOB: 1955-08-20, 55 y.o.   MRN: [id number]  HPI Mrs. Brianna Wheeler presents with acute onset otalgia and hypoacusis of the left ear. It started three to four days and has been progressing. The pain is located in the central ear with pain near the left TMJ and left antero-lateral neck. She has never had a previous episode like this and denies a history of otitis. She denies fever, chills, sick contacts, and trauma. She notes a mild sore throat accompanied by a non-productive cough of 3 days duration as well. There are no symptoms on her right side.    Review of Systems  All other systems reviewed and are negative.       Objective:   Physical Exam  Constitutional: She appears well-developed and well-nourished.       obese  HENT:  Right Ear: No foreign bodies. No middle ear effusion.  Left Ear: No swelling or tenderness. No foreign bodies. Tympanic membrane mobility is abnormal. A middle ear effusion is present.  Mouth/Throat: Uvula is midline and mucous membranes are normal. No oropharyngeal exudate, posterior oropharyngeal erythema or tonsillar abscesses.  Neck:       Tenderness to palpation along anterior cervical chain of lymph nodes   BP 127/81  Pulse 78  Temp(Src) 98 F (36.7 C) (Oral)  Wt 279 lb 4.8 oz (126.69 kg)   Tympanometry: Displayed increased middle ear pressure      Assessment & Plan:  Mrs. Brianna Wheeler is a 55 year old female displaying signs of serous effusion of the middle ear.

## 2011-05-04 NOTE — Assessment & Plan Note (Signed)
This is likely the cause of her left-sided serous effusion. She already takes Flonase which may help reduce inflammation and allow for proper drainage. Therefore, I will add Sudafed extended release to her regimen for a duration of 4 weeks and reassess symptoms. The patient acknowledges and agrees with the plan.

## 2011-05-04 NOTE — Patient Instructions (Signed)
Dear Brianna Wheeler,   Thank you for coming to clinic today. I believe that the problem that you are having in your left ear is a serous effusion - which just means fluid behind the ear. For this condition, that best thing is to take Sudafed. I would recommend the extended release type for at least three weeks (the proper dosing will be on the bottle). Please follow-up with your regular doctor on 05/10/11.   Sincerely,   Dr. Clinton Sawyer

## 2011-05-10 ENCOUNTER — Encounter: Payer: Self-pay | Admitting: Family Medicine

## 2011-05-10 ENCOUNTER — Ambulatory Visit (INDEPENDENT_AMBULATORY_CARE_PROVIDER_SITE_OTHER): Payer: Medicare Other | Admitting: Family Medicine

## 2011-05-10 VITALS — BP 125/85 | HR 85 | Temp 98.1°F | Ht 71.0 in | Wt 275.5 lb

## 2011-05-10 DIAGNOSIS — M79609 Pain in unspecified limb: Secondary | ICD-10-CM

## 2011-05-10 DIAGNOSIS — Z23 Encounter for immunization: Secondary | ICD-10-CM

## 2011-05-10 DIAGNOSIS — H6980 Other specified disorders of Eustachian tube, unspecified ear: Secondary | ICD-10-CM

## 2011-05-10 DIAGNOSIS — E119 Type 2 diabetes mellitus without complications: Secondary | ICD-10-CM

## 2011-05-10 DIAGNOSIS — E039 Hypothyroidism, unspecified: Secondary | ICD-10-CM

## 2011-05-10 DIAGNOSIS — F329 Major depressive disorder, single episode, unspecified: Secondary | ICD-10-CM | POA: Insufficient documentation

## 2011-05-10 DIAGNOSIS — M79671 Pain in right foot: Secondary | ICD-10-CM

## 2011-05-10 LAB — POCT GLYCOSYLATED HEMOGLOBIN (HGB A1C): Hemoglobin A1C: 5.6

## 2011-05-10 MED ORDER — PNEUMOCOCCAL VAC POLYVALENT 25 MCG/0.5ML IJ INJ: 0.5000 mL | INJECTION | Freq: Once | INTRAMUSCULAR | Status: DC

## 2011-05-10 MED ORDER — TRAMADOL HCL 50 MG PO TABS: 50.0000 mg | ORAL_TABLET | Freq: Two times a day (BID) | ORAL | Status: DC

## 2011-05-10 MED ORDER — METFORMIN HCL 850 MG PO TABS: 850.0000 mg | ORAL_TABLET | Freq: Every day | ORAL | Status: DC

## 2011-05-10 NOTE — Assessment & Plan Note (Signed)
Still with very good A1C! Patient would like to continue Metformin through holiday season, return in Jan, and possible stop at that point. Will do trial of 3-6 months off Metformin and then recheck A1C.

## 2011-05-10 NOTE — Assessment & Plan Note (Signed)
Recheck TSH today.  

## 2011-05-10 NOTE — Assessment & Plan Note (Signed)
Much improved.  No effusion noted on exam today. To continue Sudafed on prn basis.

## 2011-05-10 NOTE — Patient Instructions (Signed)
Keep taking the Tramadol for pain relief. Keep your foot elevated as much as possible.  Use a compression stocking or Ace bandage wrapped around your foot for support. Will do 3 more months of the metformin. Come back in January and we will retest her A1c. We may stop the medicine for good at that point. I have sent in your refills today. We are going to check your thyroid function today. If it's abnormal I will give you a call otherwise we'll send you a letter.

## 2011-05-10 NOTE — Assessment & Plan Note (Signed)
Possible stress fracture, more likely tendonitis secondary to overuse with much increased activity level caring for husband.   No metatarsal, navicular pain.   Tramadol, rest, compression as treatment.  FU if no improvement in 1 month. May need [place] versus radiographs at that time.

## 2011-05-10 NOTE — Progress Notes (Signed)
  Subjective:    Patient ID: Brianna Wheeler, female    DOB: 12/18/1955, 55 y.o.   MRN: [id number]  HPI 1. Right foot pain:  Began 2-3 weeks ago. Gradually noticed and gradually increasing in intensity and severity.  Pain mostly on lateral dorsal aspect of foot when she is bearing weight.  Described as sharp pain.  Improved with Tramadol and rest, elevation of foot.  No fevers or chills, no trauma, no falls.    2.  Diabetes:  Currently on Metformin.  No adverse effects from medication.  No hypoglycemic events.  No paresthesia or peripheral nerve pain.  Measures blood sugars at home every:  Day or so.  Walking everyday for exercise.  Watching what she eats very strictly.   Lab Results  Component Value Date   HGBA1C 5.6 05/10/2011   3. Stress at home: Has been diagnosed with stage IV lung cancer. Being treated at [facility]. He is now relegated to a wheelchair. Ms. Tiedt is having depression everywhere he needs to go. He also basically has difficulty filling his activities of daily living. She feels this is starting to build up as far as stressors in her life. She received a lot of support from friends and family for the first month but for the past 2 months this is basically ended. Her sister, who who was providing her lots of support, has also become ill.  Denies any suicidal or homicidal ideations currently. Knows she needs to stay alive to help her husband.  4.  Fluid behind ears:  Diagnosed with serous otitis media last visit.  Treated with Flonase and Sudafed.  Still with occasional ear pain. No drainage from ears, no recent illnesses.  She does state that her ear is improving. Review of Systems See HPI above for review of systems.       Objective:   Physical Exam Gen:  Alert, cooperative patient who appears stated age in no acute distress.  Vital signs reviewed. HEENT:  /AT.  EOMI, PERRL.  MMM, tonsils non-erythematous, non-edematous.  External ears WNL, Bilateral TM's normal without  retraction, redness or bulging.  Cardiac:  Regular rate and rhythm without murmur auscultated.  Good S1/S2. Pulm:  Clear to auscultation bilaterally with good air movement.  No wheezes or rales noted.   Ext:  Right foot with pain on palpation over cuboid bone.  Moderate pain.  No pain navicular "N" spot, no pain over head of 5th metatarsal.  Some pain on eversion of foot, none on dorsiflexion, plantarflexion, inversion.   CV:  Good distal pulses BL LEs Neuro:  No decreased sensation BL LE's        Assessment & Plan:

## 2011-05-10 NOTE — Assessment & Plan Note (Signed)
Did NOT do PHQ-9 today, this would be good to do at a future visit. No feelings of guilt, change in appetite, general sadness currently.   States she is managing stressors with activity.   FU in 3 months or sooner if worsening symptoms.

## 2011-05-11 LAB — TSH: TSH: 5.541 u[IU]/mL — ABNORMAL HIGH (ref 0.350–4.500)

## 2011-06-07 ENCOUNTER — Telehealth: Payer: Self-pay | Admitting: Family Medicine

## 2011-06-07 NOTE — Telephone Encounter (Signed)
Wants to know if Dr Gwendolyn Grant has rec'd a request for her diabetic supplies.  She had talked to [facility] and they were supposed to have faxed request.  Wants to know if this was taken care of.

## 2011-06-08 NOTE — Telephone Encounter (Signed)
Forwarded to pcp.Brianna Wheeler [name]  

## 2011-06-12 NOTE — Telephone Encounter (Signed)
Called to inform patient I just now received info.  Have completed and placed in to be faxed box.

## 2011-06-28 ENCOUNTER — Other Ambulatory Visit: Payer: Self-pay | Admitting: Family Medicine

## 2011-06-28 NOTE — Telephone Encounter (Signed)
Checking status of rx for tramadol

## 2011-06-28 NOTE — Telephone Encounter (Signed)
Brianna Wheeler, Rx was written 10/18 with 1 refill. Is she going to have to come back in to get refilled? ----[place] Dec

## 2011-06-29 NOTE — Telephone Encounter (Signed)
No, she or pharmacy can just call.

## 2011-06-29 NOTE — Telephone Encounter (Signed)
LVM about below.

## 2011-07-23 ENCOUNTER — Telehealth: Payer: Self-pay | Admitting: Family Medicine

## 2011-07-23 NOTE — Telephone Encounter (Signed)
Pt calling emergency line today requesting refill for muscle relaxer.  Encouraged pt to communicate with pharmacy what medications she needs refills on and that they will contact Dr. Gwendolyn Grant regarding refills.  Pt states understanding.  Will route to PCP.

## 2011-07-25 ENCOUNTER — Other Ambulatory Visit: Payer: Self-pay | Admitting: Family Medicine

## 2011-07-25 MED ORDER — CYCLOBENZAPRINE HCL 10 MG PO TABS: 10.0000 mg | ORAL_TABLET | Freq: Two times a day (BID) | ORAL | Status: DC | PRN

## 2011-07-25 NOTE — Telephone Encounter (Signed)
Done

## 2011-07-25 NOTE — Telephone Encounter (Signed)
Informed pt by lvm  the rx has been sent into pharmacy.Loralee Pacas [place]

## 2011-07-25 NOTE — Telephone Encounter (Signed)
[name] is calling back about a refill on her Flexeril to go to [facility] on [address].  [facility] says they have sent it but it is not in his box.  She does have an appt for 1/15.

## 2011-07-25 NOTE — Telephone Encounter (Signed)
Forwarded to Dr. Gwendolyn Grant.Brianna Wheeler [name] Called pt to inform her of this.Brianna Wheeler Brianna Wheeler

## 2011-08-05 ENCOUNTER — Telehealth: Payer: Self-pay | Admitting: Family Medicine

## 2011-08-05 NOTE — Telephone Encounter (Signed)
Patient called emergency line tonight for advice.  She ate fish sandwich from [employer] a few hours ago and is now belching.  Has taken her PPI today already and wants to know if she should take a second. Feels nauseated, no vomiting or diarrhea. CBG was 83 earlier, which is normal for her.  She would like to know what she can do for this.  Discouraged against taking double dose of medicine but suggested trying OTC things such at Tums or Pepto for stomach relief.  Also suggested flat soda such as ginger ale, saltine crackers, or peppermint; any stomach-soothing agent that usually works for her.  Red flags such as intractable vomiting, dehydration, hyper or hypoglycemia dicussed and patient stated understanding.  Advised patient to try symptomatic management and that this will like resolve on its own.

## 2011-08-07 ENCOUNTER — Encounter: Payer: Self-pay | Admitting: Family Medicine

## 2011-08-07 ENCOUNTER — Ambulatory Visit (INDEPENDENT_AMBULATORY_CARE_PROVIDER_SITE_OTHER): Payer: Medicare Other | Admitting: Family Medicine

## 2011-08-07 VITALS — BP 124/82 | HR 72 | Temp 97.6°F | Ht 71.0 in | Wt 266.9 lb

## 2011-08-07 DIAGNOSIS — F329 Major depressive disorder, single episode, unspecified: Secondary | ICD-10-CM

## 2011-08-07 DIAGNOSIS — E119 Type 2 diabetes mellitus without complications: Secondary | ICD-10-CM

## 2011-08-07 DIAGNOSIS — E039 Hypothyroidism, unspecified: Secondary | ICD-10-CM

## 2011-08-07 DIAGNOSIS — I10 Essential (primary) hypertension: Secondary | ICD-10-CM

## 2011-08-07 DIAGNOSIS — IMO0002 Reserved for concepts with insufficient information to code with codable children: Secondary | ICD-10-CM

## 2011-08-07 MED ORDER — SIMVASTATIN 40 MG PO TABS: 40.0000 mg | ORAL_TABLET | Freq: Every day | ORAL | Status: DC

## 2011-08-07 MED ORDER — TRAMADOL HCL 50 MG PO TABS: 50.0000 mg | ORAL_TABLET | Freq: Two times a day (BID) | ORAL | Status: DC

## 2011-08-07 NOTE — Assessment & Plan Note (Signed)
We'll repeat testing in 3 months

## 2011-08-07 NOTE — Assessment & Plan Note (Signed)
Still with well-controlled A1c of 5.4. She wants to try 3 months off of metformin to do this now. We'll retest A1c in 3 months

## 2011-08-07 NOTE — Progress Notes (Signed)
  Subjective:    Patient ID: Brianna Wheeler, female    DOB: [birth year], 56 y.o.   MRN: [id number]  HPI #1. Depression: Husband is declining rapidly. He is on hospice now. He does have diagnosis of bilateral lung cancer which is now metastasized to bone. Patient and her son are doing all of his patient care. Patient does state that nursing comes as well as an aide to help with bathing.Marland Kitchen He has a Comptroller throughout the day. However as far as a manual laborer including transfer patient from chair to bed she and her son do all the work. Please see problem #2 below.  Obviously going through tough times because of this. States she has "good days and bad days." No suicidal or homicidal ideations  2.  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  Taking Tramadol and Flexeril more often with relief.  Requesting refills of these today.  No incontinence or paresthesias/numbness noted.  3.   Diabetes:  Currently on  Metformin.   No adverse effects from medication.  No hypoglycemic events.  No paresthesia or peripheral nerve pain.  Measures blood sugars at home every:   Few days.  Still working hard at losing weight despite everything she is going through.  Would like to try time off Metformin. Lab Results  Component Value Date   HGBA1C 5.4 08/07/2011   The following portions of the patient's history were reviewed and updated as appropriate: allergies, current medications, past medical history, family and social history, and problem list, including patient being a nonsmoker.   Review of Systems See HPI above for review of systems.       Objective:   Physical Exam Gen:  Alert, cooperative patient who appears stated age in no acute distress.  Vital signs reviewed. Throat:  No goiter Cardiac:  Regular rate and rhythm without murmur auscultated.  Good S1/S2. Pulm:  Clear to auscultation bilaterally with good air movement.  No wheezes or  rales noted.   Abd:  Pendulous skin. Soft/nontender. Ext:  No edema noted MSK:  Somewhat tender to palpation BL lumbar paraspinal muscles.  Straight leg raise positive for hamstring tightness but not for back pain.  Psych:  Somewhat depressed appearing.  Not anxious.        Assessment & Plan:

## 2011-08-07 NOTE — Assessment & Plan Note (Signed)
No current evidence of radiculopathy. Plan refilled her tramadol and Flexeril today. No red flags on exam or by history. Also recommended patient obtain back brace to help with heavy lifting of her husband.

## 2011-08-07 NOTE — Assessment & Plan Note (Addendum)
Obviously worsened secondary to husband's condition. She declines any medication today.  I would have her follow up in 4-6 weeks so we can continue to discuss ways of dealing with her depression. Also discussed social and personal support systems with her during this very trying time.

## 2011-08-07 NOTE — Assessment & Plan Note (Signed)
At goal today. No changes in medications.

## 2011-08-07 NOTE — Patient Instructions (Signed)
Stop taking the Metformin.  We will recheck your A1C in 3 months.  Come back and see me in 6 weeks.  I want to make sure you're still doing ok.

## 2011-08-16 ENCOUNTER — Telehealth: Payer: Self-pay | Admitting: Family Medicine

## 2011-08-16 MED ORDER — METFORMIN HCL 850 MG PO TABS: 850.0000 mg | ORAL_TABLET | Freq: Every day | ORAL | Status: DC

## 2011-08-16 NOTE — Telephone Encounter (Signed)
Patient feels that she needs to go back on Metformin because her Blood Sugars have been all over the place.  She would like to talk to Dr. Gwendolyn Grant about this.

## 2011-08-16 NOTE — Telephone Encounter (Signed)
I will send in for another prescription and note this in chart.  It's fine for her to do this.  Can we call and let her know?  We will also discuss this further at her next OV.

## 2011-08-16 NOTE — Telephone Encounter (Signed)
Informed pt that message has been routed to pcp.Loralee Pacas [place]

## 2011-09-08 ENCOUNTER — Telehealth: Payer: Self-pay | Admitting: Family Medicine

## 2011-09-08 NOTE — Telephone Encounter (Signed)
Patient is currently on Tramadol for pain.  She is having worsening pain due to taking care of husband who has cancer.  Pain is worse this today after over exertion and heavy lifting.  She typically takes one tablet of Tramadol, but is now asking if she can take two tablets.  I told her she may take 2 tablets for pain x 1.  I told her to take it easy in case Tramadol makes her drowsy.  Says she has an appointment with PCP soon.

## 2011-09-13 ENCOUNTER — Telehealth: Payer: Self-pay | Admitting: Family Medicine

## 2011-09-13 NOTE — Telephone Encounter (Signed)
Is having leg muscle spasms with jerks and spasm in both legs.  Two tramadol did not help.  Advised to be seen in clinic in AM by calling at [phone] or if gets worse or pt is worried, go to ED.  Pt states she will call office in AM

## 2011-09-18 ENCOUNTER — Encounter: Payer: Self-pay | Admitting: Family Medicine

## 2011-09-18 ENCOUNTER — Other Ambulatory Visit: Payer: Self-pay | Admitting: Family Medicine

## 2011-09-18 ENCOUNTER — Ambulatory Visit (INDEPENDENT_AMBULATORY_CARE_PROVIDER_SITE_OTHER): Payer: Medicare Other | Admitting: Family Medicine

## 2011-09-18 DIAGNOSIS — IMO0002 Reserved for concepts with insufficient information to code with codable children: Secondary | ICD-10-CM

## 2011-09-18 DIAGNOSIS — F329 Major depressive disorder, single episode, unspecified: Secondary | ICD-10-CM

## 2011-09-18 DIAGNOSIS — E785 Hyperlipidemia, unspecified: Secondary | ICD-10-CM

## 2011-09-18 DIAGNOSIS — E039 Hypothyroidism, unspecified: Secondary | ICD-10-CM

## 2011-09-18 LAB — LDL CHOLESTEROL, DIRECT: Direct LDL: 90 mg/dL

## 2011-09-19 ENCOUNTER — Other Ambulatory Visit: Payer: Self-pay | Admitting: Family Medicine

## 2011-09-19 ENCOUNTER — Telehealth: Payer: Self-pay | Admitting: Family Medicine

## 2011-09-19 MED ORDER — TRAMADOL HCL 50 MG PO TABS: 100.0000 mg | ORAL_TABLET | Freq: Two times a day (BID) | ORAL | Status: DC

## 2011-09-19 NOTE — Telephone Encounter (Signed)
Done

## 2011-09-19 NOTE — Telephone Encounter (Signed)
Refill request

## 2011-09-19 NOTE — Telephone Encounter (Signed)
All of the refills from her visit yesterday went through accept the Tramadol and it needs to go to [facility] on [address].

## 2011-09-19 NOTE — Telephone Encounter (Signed)
Dr. Gwendolyn Grant states he will take care of Rx at lunchtime. Message left on voicemail for patient. Marland Kitchen

## 2011-09-19 NOTE — Telephone Encounter (Signed)
Text message sent to Dr. Gwendolyn Grant about this.

## 2011-09-20 ENCOUNTER — Encounter: Payer: Self-pay | Admitting: Family Medicine

## 2011-09-20 LAB — CMP AND LIVER
ALT: 20 U/L (ref 0–35)
AST: 37 U/L (ref 0–37)
Alkaline Phosphatase: 44 U/L (ref 39–117)
BUN: 15 mg/dL (ref 6–23)
Creat: 0.82 mg/dL (ref 0.50–1.10)
Total Bilirubin: 0.5 mg/dL (ref 0.3–1.2)

## 2011-09-21 NOTE — Assessment & Plan Note (Signed)
No radiculopathy symptoms today.  Improved with back brace. Provided Tramadol refill.

## 2011-09-21 NOTE — Assessment & Plan Note (Signed)
No treatment desired at this time.  I have discussed with her options and that I am open to her whenever she needs to meet with me.

## 2011-09-21 NOTE — Progress Notes (Signed)
  Subjective:    Patient ID: Brianna Wheeler, female    DOB: [birth year], 56 y.o.   MRN: [id number]  HPI 1.  Depression:  Persists.  She is dealing with husband's terminal prognosis only because of this poor she has from friends and family. Does have periods of crying because she is losing her husband. This is a homicidal ideations. No desire for medication or counseling at this time.  #2 back pain: Persists. She is wearing back brace which has helped some. She is also taking tramadol 2 pills twice a day for relief. She is not taking anything else such as Tylenol or over-the-counter analgesics because she is worried about reactions that she mixes these medications with tramadol. Does still have back pain is improved somewhat. Denies any reflux symptoms which is bowel or bladder incontinence or saddle anesthesia. Denies any radiation of pain or paresthesias to bilateral lower extremities.  Review of Systems     Objective:   Physical Exam Gen:  Alert, cooperative patient who appears stated age in no acute distress.  Vital signs reviewed. Psych:  Tearful at times but linear thought process Back - Normal skin, Spine with normal alignment and no deformity.  No tenderness to vertebral process palpation.  Paraspinous muscles are not tender and without spasm.   Range of motion is full at neck and lumbar sacral regions          Assessment & Plan:

## 2011-10-01 ENCOUNTER — Encounter: Payer: Self-pay | Admitting: Family Medicine

## 2011-10-03 ENCOUNTER — Telehealth: Payer: Self-pay | Admitting: Family Medicine

## 2011-10-03 NOTE — Telephone Encounter (Signed)
I will give her a call tomorrow.  Thanks for letting me know.

## 2011-10-03 NOTE — Telephone Encounter (Signed)
Ms Grillot is starting to have a very difficult time dealing with he husband's decline.  She is wanting to know if Dr Gwendolyn Grant can call something in for her nerves. [facility]- [address]

## 2011-10-04 MED ORDER — CITALOPRAM HYDROBROMIDE 20 MG PO TABS: 20.0000 mg | ORAL_TABLET | Freq: Every day | ORAL | Status: DC

## 2011-10-04 MED ORDER — LORAZEPAM 0.5 MG PO TABS: 0.5000 mg | ORAL_TABLET | Freq: Two times a day (BID) | ORAL | Status: AC | PRN

## 2011-10-04 NOTE — Telephone Encounter (Signed)
Called and left message with patient.  Plan to start Celexa plus Ativan to address anxiety with FU in 7 - 10 days.  Provided written instructions for med use.

## 2011-10-04 NOTE — Telephone Encounter (Signed)
Pt called to talk about her husband and then questioned this.  Advised that she may come to pick up the lorazapam and the other med is at the pharmacy. Donnielle Addison, [name]

## 2011-10-04 NOTE — Telephone Encounter (Signed)
Patient is calling back because she has not heard back from her MD yet.  She is very nervous, tearful and exhausted and would like Dr. Gwendolyn Grant to call her as soon as he can.

## 2011-11-02 ENCOUNTER — Telehealth: Payer: Self-pay | Admitting: Family Medicine

## 2011-11-02 NOTE — Telephone Encounter (Signed)
Called and told pt to use ice on the affected area. Told her do not apply this directly to her skin. She was told to wrap it with a towel and use this for no longer than 15-20 at a time, and to take either Ibu or alieve to help with the pain. Pt voice understanding and agreed.Laureen Ochs, Viann Shove

## 2011-11-02 NOTE — Telephone Encounter (Signed)
Please call Brianna Wheeler to give advice about what she can put on her arm from muscle strain

## 2011-11-07 ENCOUNTER — Encounter: Payer: Self-pay | Admitting: Family Medicine

## 2011-11-07 ENCOUNTER — Ambulatory Visit (INDEPENDENT_AMBULATORY_CARE_PROVIDER_SITE_OTHER): Payer: Medicare Other | Admitting: Family Medicine

## 2011-11-07 VITALS — BP 125/83 | HR 66 | Temp 97.9°F | Wt 266.2 lb

## 2011-11-07 DIAGNOSIS — F329 Major depressive disorder, single episode, unspecified: Secondary | ICD-10-CM

## 2011-11-07 NOTE — Patient Instructions (Signed)
Don't worry about the death certificate.  We will get that covered.    Take the Tylenol PM at the same time every night.  Follow your normal nighttime routine to help your body know its time for dinner.  Come back in about 2 weeks so we can make sure you're doing okay.

## 2011-11-09 NOTE — Progress Notes (Signed)
  Subjective:    Patient ID: Brianna Wheeler, female    DOB: 31-Oct-1955, 56 y.o.   MRN: [id number]  HPI  1.  Grieving:  After prolonged illness secondary to lung cancer patient's husband finally passed away on 2023/01/04 which is up 4 days ago. Patient obviously very distraught since then. She's been having trouble sleeping. She feels overwhelmed with trying to have her husband's arrangements cared for. She is not receiving any support from family members. She is also stressed because she is caring for developmentally delayed son is also having difficulties with father's death. She denies any suicidal or homicidal ideations.  Review of Systems See HPI above for review of systems.       Objective:   Physical Exam Gen:  Alert, cooperative patient who appears stated age in no acute distress.  Vital signs reviewed. Psych:  Depressed appearing.  Tearful.  Linear thought process.        Assessment & Plan:

## 2011-11-09 NOTE — Assessment & Plan Note (Signed)
Appropriate grieving response. Discussed need for sleep and taking care of herself through this process. Long discussion that these feelings of depression and feeling overwhelmed at this time period are normal.  She expressed some relief at this point.   She does not want prescription sleeping pills -- will try Tylenol PM prn.  Discussed sleep hygiene. To FU in 2 weeks to assess for improvement or any worsening of depression. Provided our number to call if she is feeling overwhelmed or at wit's end.   She declined any assistance with grief counseling.

## 2011-11-14 ENCOUNTER — Telehealth: Payer: Self-pay | Admitting: Family Medicine

## 2011-11-14 MED ORDER — TRAMADOL HCL 50 MG PO TABS: 100.0000 mg | ORAL_TABLET | Freq: Two times a day (BID) | ORAL | Status: DC

## 2011-11-14 NOTE — Telephone Encounter (Signed)
Refill sent.

## 2011-11-14 NOTE — Telephone Encounter (Signed)
Patient is calling, initially saying she could not remember how this works, pertaining to refill for Tramadol.  I let her know that she needs to call her Pharmacy which she said she had done, but there was no refill request in doctors box.  I asked her to call her pharmacy again and let her know that I would also let the doctor know.

## 2011-11-28 ENCOUNTER — Ambulatory Visit: Payer: Medicare Other | Admitting: Family Medicine

## 2011-12-05 ENCOUNTER — Encounter: Payer: Self-pay | Admitting: Family Medicine

## 2011-12-05 ENCOUNTER — Ambulatory Visit (INDEPENDENT_AMBULATORY_CARE_PROVIDER_SITE_OTHER): Payer: Medicare Other | Admitting: Family Medicine

## 2011-12-05 VITALS — BP 113/78 | HR 67 | Ht 71.0 in | Wt 267.0 lb

## 2011-12-05 DIAGNOSIS — F329 Major depressive disorder, single episode, unspecified: Secondary | ICD-10-CM

## 2011-12-05 MED ORDER — TRAZODONE HCL 50 MG PO TABS: 25.0000 mg | ORAL_TABLET | Freq: Every evening | ORAL | Status: DC | PRN

## 2011-12-05 NOTE — Progress Notes (Signed)
S: Pt comes in today for follow up per Dr. Gwendolyn Grant.  GRIEF REACTION Pt recently lost her husband to lung cancer and Dr. Gwendolyn Grant wanted close f/u to ensure she was doing ok.  Is not taking the xanax or celexa b/c it made her "insides shake."  Not sleeping at night (<1 hr) and maybe 30 min during the day.  Tried Tylenol PM, but it didn't help (took for 6 days and still didn't help).  Is open to trying Rx sleeping medicine.  Other than vomiting today, has had an ok appetite.  No SI/HI, just wishes she had passed before her husband, which she feels is normal.  Is going to hospice grievance counselor.    Has had a lot to deal with including paperwork, lawyers, husband's brother.    VOMITING  Start this morning; woke up nauseated and then vomited x2.  Nonbloody, just thick mucus.  Was able to eat breakfast before vomiting.  Has been able to keep down fluids since then.  No diarrhea, no fevers.     ROS: Per HPI  History  Smoking status  . Never Smoker   Smokeless tobacco  . Never Used    O:  Filed Vitals:   12/05/11 1400  BP: 113/78  Pulse: 67    Gen: NAD, appropriate, interactive, not tearful CV: RRR, no murmur   A/P: 56 y.o. female p/w depression, anxiety -See problem list -f/u in 2 weeks

## 2011-12-05 NOTE — Assessment & Plan Note (Signed)
PHQ-9: 17; patient's biggest concern is not sleeping.  Is willing to try trazodone qhs.  Stopped celexa and xanax and does not wish to try different SSRI/SNRI/medication at this time.  Will re-discuss at her next appt.  No Si/HI.  Overall appears to be appropriately grieving the relatively recent death of her husband (~4 weeks ago). F/u 2 weeks

## 2011-12-05 NOTE — Patient Instructions (Signed)
It was nice to meet you, I'm so sorry for your loss.  We will start a medicine called Trazodone to help you sleep.  You can try taking a brisk walk an hour or 2 before bed time to see if that helps you sleep.    Make sure you keep a normal sleep schedule, even if you aren't sleeping.   I'm very glad that you are getting counseling.    Please come back in 2-4 weeks so we can see how you are doing.

## 2011-12-06 ENCOUNTER — Telehealth: Payer: Self-pay | Admitting: Family Medicine

## 2011-12-06 NOTE — Telephone Encounter (Signed)
Patient called emergency line.  Was seen in clinic yesterday (by me) for f/u on depression from her husband dying 4 weeks ago,  Had stopped antidepressants started by PCP b/c they made her shaky and nauseated.  Nausea has persisted.  I started her on tranzodone for sleep but it makes her a little light headed.  Suggested she take it and then lay down right away.  Patient also c/o throat being sore-- unsure if it is from her sinuses draining.  No fevers.  + nausea, denies cough, some vomiting. Is on Allegra.  Suggested she come in tomorrow AM for SDA since her ST and nausea could potentially be from strep vs allergies and she may benefit from something like flonase.

## 2011-12-07 ENCOUNTER — Ambulatory Visit (INDEPENDENT_AMBULATORY_CARE_PROVIDER_SITE_OTHER): Payer: Medicare Other | Admitting: Family Medicine

## 2011-12-07 ENCOUNTER — Encounter: Payer: Self-pay | Admitting: Family Medicine

## 2011-12-07 VITALS — BP 132/83 | HR 76 | Temp 97.8°F | Ht 71.0 in | Wt 265.0 lb

## 2011-12-07 DIAGNOSIS — E119 Type 2 diabetes mellitus without complications: Secondary | ICD-10-CM

## 2011-12-07 DIAGNOSIS — J45909 Unspecified asthma, uncomplicated: Secondary | ICD-10-CM

## 2011-12-07 LAB — POCT GLYCOSYLATED HEMOGLOBIN (HGB A1C): Hemoglobin A1C: 5.7

## 2011-12-07 MED ORDER — PREDNISONE 50 MG PO TABS: ORAL_TABLET | ORAL | Status: DC

## 2011-12-07 MED ORDER — METHYLPREDNISOLONE ACETATE 80 MG/ML IJ SUSP
80.0000 mg | Freq: Once | INTRAMUSCULAR | Status: AC
  Administered 2011-12-07: 80 mg via INTRAMUSCULAR

## 2011-12-07 MED ORDER — ALBUTEROL SULFATE HFA 108 (90 BASE) MCG/ACT IN AERS: 2.0000 | INHALATION_SPRAY | RESPIRATORY_TRACT | Status: DC | PRN

## 2011-12-07 NOTE — Assessment & Plan Note (Signed)
Viral induced asthma exacerbation. Also overlapping allergic sxs. Depomedrol 80mg  IM x1. 4 day course of prednisone. Albuterol refilled. Resp and infectious red flags reviewed. Handout given. Follow up as needed.  Discussed CBG surveillance in setting of DM.

## 2011-12-07 NOTE — Progress Notes (Signed)
  Subjective:    Patient ID: Brianna Wheeler, female    DOB: 07-21-1956, 56 y.o.   MRN: [id number]  HPI URI Symptoms Onset: 2 days  Description: rhinorrhea, nasal congestion, post nasal drip, cough, mild wheezing  Modifying factors:  Baseline hx/o intermittent asthma, has not had inhaler.   Symptoms Nasal discharge: yes Fever: no Sore throat: yes Cough: yes Wheezing: mild Ear pain: no GI symptoms: emesis Sick contacts: no  Red Flags  Stiff neck: no Dyspnea: no Rash: no Swallowing difficulty: no  Sinusitis Risk Factors Headache/face pain: yes Double sickening: no tooth pain: no  Allergy Risk Factors Sneezing: no Itchy scratchy throat: yes Seasonal symptoms: yes  Flu Risk Factors Headache: no muscle aches: no severe fatigue: no     Review of Systems See HPI, otherwise ROS negative     Objective:   Physical Exam Gen: up in chair, NAD HEENT: NCAT, EOMI, TMs clear bilaterally, +nasal erythema, rhinorrhea bilaterally, + post oropharyngeal erythema  CV: RRR, no murmurs auscultated PULM: CTAB, no wheezes, rales, rhoncii ABD: S/NT/+ bowel sounds  EXT: 2+ peripheral pulses    Assessment & Plan:

## 2011-12-07 NOTE — Progress Notes (Signed)
Addended by: Garen Grams F on: 12/07/2011 03:46 PM   Modules accepted: Orders

## 2011-12-07 NOTE — Assessment & Plan Note (Signed)
Recheck A1C today. Discussed watching CBGs with prednisone tx.

## 2011-12-07 NOTE — Patient Instructions (Signed)
Upper Respiratory Infection, Adult  An upper respiratory infection (URI) is also known as the common cold. It is often caused by a type of germ (virus). Colds are easily spread (contagious). You can pass it to others by kissing, coughing, sneezing, or drinking out of the same glass. Usually, you get better in 1 or 2 weeks.   HOME CARE    Only take medicine as told by your doctor.   Use a warm mist humidifier or breathe in steam from a hot shower.   Drink enough water and fluids to keep your pee (urine) clear or pale yellow.   Get plenty of rest.   Return to work when your temperature is back to normal or as told by your doctor. You may use a face mask and wash your hands to stop your cold from spreading.  GET HELP RIGHT AWAY IF:    After the first few days, you feel you are getting worse.   You have questions about your medicine.   You have chills, shortness of breath, or brown or red spit (mucus).   You have yellow or brown snot (nasal discharge) or pain in the face, especially when you bend forward.   You have a fever, puffy (swollen) neck, pain when you swallow, or white spots in the back of your throat.   You have a bad headache, ear pain, sinus pain, or chest pain.   You have a high-pitched whistling sound when you breathe in and out (wheezing).   You have a lasting cough or cough up blood.   You have sore muscles or a stiff neck.  MAKE SURE YOU:    Understand these instructions.   Will watch your condition.   Will get help right away if you are not doing well or get worse.  Document Released: 12/26/2007 Document Revised: 06/28/2011 Document Reviewed: 11/13/2010  ExitCare Patient Information 2012 [employer]  Asthma, Adult  Asthma is caused by narrowing of the air passages in the lungs. It may be triggered by pollen, dust, animal dander, molds, some foods, respiratory infections, exposure to smoke, exercise, emotional stress or other allergens (things that cause allergic reactions or  allergies). Repeat attacks are common.  HOME CARE INSTRUCTIONS    Use prescription medications as ordered by your caregiver.   Avoid pollen, dust, animal dander, molds, smoke and other things that cause attacks at home and at work.   You may have fewer attacks if you decrease dust in your home. Electrostatic air cleaners may help.   It may help to replace your pillows or mattress with materials less likely to cause allergies.   Talk to your caregiver about an action plan for managing asthma attacks at home, including, the use of a peak flow meter which measures the severity of your asthma attack. An action plan can help minimize or stop the attack without having to seek medical care.   If you are not on a fluid restriction, drink 8 to 10 glasses of water each day.   Always have a plan prepared for seeking medical attention, including, calling your physician, accessing local emergency care, and calling 911 (in the [place] for a severe attack.   Discuss possible exercise routines with your caregiver.   If animal dander is the cause of asthma, you may need to get rid of pets.  SEEK MEDICAL CARE IF:    You have wheezing and shortness of breath even if taking medicine to prevent attacks.   You have muscle   aches, chest pain or thickening of sputum.   Your sputum changes from clear or white to yellow, green, gray, or bloody.   You have any problems that may be related to the medicine you are taking (such as a rash, itching, swelling or trouble breathing).  SEEK IMMEDIATE MEDICAL CARE IF:    Your usual medicines do not stop your wheezing or there is increased coughing and/or shortness of breath.   You have increased difficulty breathing.   You have a fever.  MAKE SURE YOU:    Understand these instructions.   Will watch your condition.   Will get help right away if you are not doing well or get worse.  Document Released: 07/09/2005 Document Revised: 06/28/2011 Document Reviewed: 02/25/2008  [employer] Patient  Information 2012 [employer].

## 2011-12-08 ENCOUNTER — Telehealth: Payer: Self-pay | Admitting: Family Medicine

## 2011-12-08 MED ORDER — GUAIFENESIN-DM 100-10 MG/5ML PO SYRP: 5.0000 mL | ORAL_SOLUTION | Freq: Three times a day (TID) | ORAL | Status: AC | PRN

## 2011-12-08 MED ORDER — BENZONATATE 100 MG PO CAPS: 100.0000 mg | ORAL_CAPSULE | Freq: Two times a day (BID) | ORAL | Status: AC | PRN

## 2011-12-08 NOTE — Telephone Encounter (Signed)
Patient called saying she couldn't sleep last night because of cough. Meds ordered this encounter  Medications  . guaiFENesin-dextromethorphan (ROBITUSSIN DM) 100-10 MG/5ML syrup    Sig: Take 5 mLs by mouth 3 (three) times daily as needed for cough.    Dispense:  118 mL    Refill:  0  . benzonatate (TESSALON) 100 MG capsule    Sig: Take 1 capsule (100 mg total) by mouth 2 (two) times daily as needed for cough.    Dispense:  20 capsule    Refill:  0

## 2011-12-12 ENCOUNTER — Telehealth: Payer: Self-pay | Admitting: Family Medicine

## 2011-12-12 NOTE — Telephone Encounter (Signed)
Message left to return call.

## 2011-12-12 NOTE — Telephone Encounter (Signed)
Message again  left to return call. 

## 2011-12-12 NOTE — Telephone Encounter (Signed)
Pt states she is not any better and wants to know if she should have abx called in. she is now coughing yellow

## 2011-12-13 ENCOUNTER — Encounter: Payer: Self-pay | Admitting: Family Medicine

## 2011-12-13 ENCOUNTER — Ambulatory Visit (INDEPENDENT_AMBULATORY_CARE_PROVIDER_SITE_OTHER): Payer: Medicare Other | Admitting: Family Medicine

## 2011-12-13 ENCOUNTER — Ambulatory Visit: Payer: Medicare Other | Admitting: Family Medicine

## 2011-12-13 DIAGNOSIS — J019 Acute sinusitis, unspecified: Secondary | ICD-10-CM | POA: Insufficient documentation

## 2011-12-13 MED ORDER — AZITHROMYCIN 250 MG PO TABS: ORAL_TABLET | ORAL | Status: AC

## 2011-12-13 NOTE — Telephone Encounter (Signed)
Called patient back. The mobile phone number we have listed is incorrect. states she is worse since last visit.  Appointment scheduled this AM.

## 2011-12-13 NOTE — Patient Instructions (Signed)
Nice to meet you. Sounds like you have developed a bacterial sinus infection during your viral illness. WIll treat with azithromycin antiobiotic. If you have worsening of symptoms, development of fevers or do not improve with treatment then come back to doctor. Or if you develop wheezing or worsening of asthma symptoms, come back.  Sinusitis Sinuses are air pockets within the bones of your face. The growth of bacteria within a sinus leads to infection. The infection prevents the sinuses from draining. This infection is called sinusitis. SYMPTOMS  There will be different areas of pain depending on which sinuses have become infected.  The maxillary sinuses often produce pain beneath the eyes.   Frontal sinusitis may cause pain in the middle of the forehead and above the eyes.  Other problems (symptoms) include:  Toothaches.   Colored, pus-like (purulent) drainage from the nose.   Swelling, warmth, and tenderness over the sinus areas may be signs of infection.  TREATMENT  Sinusitis is most often determined by an exam.X-rays may be taken. If x-rays have been taken, make sure you obtain your results or find out how you are to obtain them. Your caregiver may give you medications (antibiotics). These are medications that will help kill the bacteria causing the infection. You may also be given a medication (decongestant) that helps to reduce sinus swelling.  HOME CARE INSTRUCTIONS   Only take over-the-counter or prescription medicines for pain, discomfort, or fever as directed by your caregiver.   Drink extra fluids. Fluids help thin the mucus so your sinuses can drain more easily.   Applying either moist heat or ice packs to the sinus areas may help relieve discomfort.   Use saline nasal sprays to help moisten your sinuses. The sprays can be found at your local drugstore.  SEEK IMMEDIATE MEDICAL CARE IF:  You have a fever.   You have increasing pain, severe headaches, or toothache.    You have nausea, vomiting, or drowsiness.   You develop unusual swelling around the face or trouble seeing.  MAKE SURE YOU:   Understand these instructions.   Will watch your condition.   Will get help right away if you are not doing well or get worse.  Document Released: 07/09/2005 Document Revised: 06/28/2011 Document Reviewed: 02/05/2007 [facility] Patient Information 2012 [place], [place].

## 2011-12-13 NOTE — Progress Notes (Signed)
  Subjective:    Patient ID: Brianna Wheeler, female    DOB: [birth year], 56 y.o.   MRN: [id number]  HPI  1. Cough. Patient complained of worsening cough, productive of yellow sputum. This began 8-9 days ago and initially stabilized but now worsening. Was treated for asthma with IM Solu-Medrol, Tessalon, at initial evaluation 8 days ago. Symptoms accompanied now by chills, subjective fever, throat eructation, facial pain, hoarseness, headache.   Has asthma, not requiring albuterol currently. Continues taking and allergic rhinitis medications including Allegra and Flonase. Patient denies any wheezing, dyspnea, diarrhea, chest pain.  Review of Systems See HPI otherwise negative.  reports that she has never smoked. She has never used smokeless tobacco.    Objective:   Physical Exam  Vitals reviewed. Constitutional: She is oriented to person, place, and time. She appears well-nourished.        Appears fatigued.  HENT:  Head: Normocephalic and atraumatic.  Right Ear: External ear normal.  Left Ear: External ear normal.       Pharyngeal injection and erythema. No extudates. No lymphadenopathy. Patient is tender over her left maxillary sinus. speaks with hoarseness, mild nasal edema.  Eyes: Scleral icterus: speaks.  Neck: Neck supple.  Cardiovascular: Normal rate, regular rhythm and normal heart sounds.  Exam reveals no gallop.   No murmur heard. Pulmonary/Chest: Effort normal and breath sounds normal. No respiratory distress. She has no wheezes. She has no rales.  Musculoskeletal: She exhibits no edema and no tenderness.  Lymphadenopathy:    She has no cervical adenopathy.  Neurological: She is alert and oriented to person, place, and time. No cranial nerve deficit.  Skin: No rash noted.  Psychiatric: She has a normal mood and affect.       Assessment & Plan:

## 2011-12-13 NOTE — Assessment & Plan Note (Signed)
Worsening cough and facial pain with syndrome of double sickening consistent with a bacterial sinusitis development in the setting of viral URI. Will treat empirically with azithromycin. Continue antihistamine and Flonase. Discussed nasal saline when necessary. Advise patient to finish antibiotic treatment, and return to care if symptoms persist or worsen including shortness of breath which may indicate need for further evaluation or development of asthma component or possibly pneumonia. Patient agrees to plan.

## 2011-12-24 ENCOUNTER — Encounter: Payer: Self-pay | Admitting: Family Medicine

## 2011-12-24 ENCOUNTER — Ambulatory Visit (INDEPENDENT_AMBULATORY_CARE_PROVIDER_SITE_OTHER): Payer: Medicare Other | Admitting: Family Medicine

## 2011-12-24 VITALS — BP 124/79 | HR 80 | Temp 98.6°F | Ht 71.0 in | Wt 269.0 lb

## 2011-12-24 DIAGNOSIS — F329 Major depressive disorder, single episode, unspecified: Secondary | ICD-10-CM

## 2011-12-24 DIAGNOSIS — E785 Hyperlipidemia, unspecified: Secondary | ICD-10-CM

## 2011-12-24 DIAGNOSIS — R252 Cramp and spasm: Secondary | ICD-10-CM | POA: Insufficient documentation

## 2011-12-24 LAB — BASIC METABOLIC PANEL
Chloride: 104 mEq/L (ref 96–112)
Potassium: 4 mEq/L (ref 3.5–5.3)
Sodium: 142 mEq/L (ref 135–145)

## 2011-12-24 MED ORDER — OMEPRAZOLE 20 MG PO CPDR: 40.0000 mg | DELAYED_RELEASE_CAPSULE | Freq: Every day | ORAL | Status: DC

## 2011-12-24 MED ORDER — LEVOTHYROXINE SODIUM 112 MCG PO TABS: 112.0000 ug | ORAL_TABLET | Freq: Every day | ORAL | Status: DC

## 2011-12-24 MED ORDER — TRAMADOL HCL 50 MG PO TABS: 100.0000 mg | ORAL_TABLET | Freq: Two times a day (BID) | ORAL | Status: DC

## 2011-12-24 MED ORDER — POTASSIUM CHLORIDE CRYS ER 20 MEQ PO TBCR: 20.0000 meq | EXTENDED_RELEASE_TABLET | Freq: Every day | ORAL | Status: DC

## 2011-12-24 NOTE — Progress Notes (Signed)
  Subjective:    Patient ID: Brianna Wheeler, female    DOB: August 09, 1955, 55 y.o.   MRN: [id number]  HPI  1.  Grieving Process:  Husband passed away roughly 1 month ago.  States she has "good days and bad days," and that she sees her husband everywhere.  She and her son are going to counseling every 2 weeks, which works well with him but she would like to increase to weekly sessions.  She mostly stays at home and cleans her house, walks for exercise with neighbor.  Not on Celexa/Ativan, feels Trazodone has helped and would like to continue with this.  Sleep is moderate, good most nights but still with bad nights.   2.  Leg cramps:  BL leg cramps that begin at nights, worse on nights when she couldn't sleep.  History of this before, resolved with exogenous potassium.  States she has been more active than usual, sore legs because of this.  Resolves once she stretches her legs.   Review of Systems See HPI above for review of systems.       Objective:   Physical Exam  Gen:  Alert, cooperative patient who appears stated age in no acute distress.  Vital signs reviewed. Cardiac:  Regular rate and rhythm without murmur auscultated.  Good S1/S2. Pulm:  Clear to auscultation bilaterally with good air movement.  No wheezes or rales noted.   Ext:  No tenderness or swelling present today Psych: Tearful twice during interview but overall mood better than prior visits.       Assessment & Plan:

## 2011-12-24 NOTE — Assessment & Plan Note (Signed)
Improving, although still in grieving process. To continue Trazodone.   Think it's a good idea that she increase counseling to weekly rather than biweekly sessions as she doesn't have much else support.  FU in 42month or sooner if worsening.

## 2011-12-24 NOTE — Patient Instructions (Signed)
Everything that you're going through is very normal at this stage.   I think increasing the counseling to once a week will be good.    We will check your potassium today.  I sent in 7 pills to take for the next 7 days, one pill a day.  Try the Prilosec at the higher dose from now on.    Come back and see me next month.

## 2011-12-24 NOTE — Assessment & Plan Note (Signed)
Likely due to increased activity (to take mind off her deceased husband).   Checking potassium as history of hypokalemia in past, she is on HCTZ. Trial of repletement.

## 2011-12-25 ENCOUNTER — Telehealth: Payer: Self-pay | Admitting: Family Medicine

## 2011-12-25 NOTE — Telephone Encounter (Signed)
Called and left message stating her potassium and other labs looked fine.

## 2011-12-26 ENCOUNTER — Telehealth: Payer: Self-pay | Admitting: Family Medicine

## 2011-12-26 NOTE — Telephone Encounter (Signed)
Patient is calling because the Rx for Prilosec was for a quantity of 30 but taking 2 per day would mean she needs a quanity of 60.  She has enough to last at least through next week, but needs this corrected in order to get her next refill.  She would like the nurse to call her back when this is completed.

## 2011-12-27 MED ORDER — OMEPRAZOLE 20 MG PO CPDR: 40.0000 mg | DELAYED_RELEASE_CAPSULE | Freq: Every day | ORAL | Status: DC

## 2011-12-27 NOTE — Telephone Encounter (Signed)
LM informing of below 

## 2011-12-27 NOTE — Telephone Encounter (Signed)
Done, thanks

## 2012-01-03 ENCOUNTER — Telehealth: Payer: Self-pay | Admitting: Family Medicine

## 2012-01-03 ENCOUNTER — Encounter: Payer: Self-pay | Admitting: *Deleted

## 2012-01-03 MED ORDER — EPINEPHRINE 0.3 MG/0.3ML IJ DEVI: 0.3000 mg | Freq: Once | INTRAMUSCULAR | Status: DC

## 2012-01-03 NOTE — Telephone Encounter (Signed)
Pt had bee stings this AM and had to call EMS - she didn't have a current Epi pen and they told her to call here to get one for next time.  [facility]- [place]

## 2012-01-03 NOTE — Telephone Encounter (Signed)
Benefits outweight risks.  Plan to refill Epi pen for her.

## 2012-01-03 NOTE — Telephone Encounter (Addendum)
Will send message to preceptor so this can be taken care of today, Attempted call back. Message left to  call back.   Consulted with Dr. Earnest Bailey  and  she advises that patient will need to come in to discuss  need with her doctor. Will await call back to find out more details.

## 2012-01-03 NOTE — Telephone Encounter (Signed)
Spoke with patient and she states this AM she was stung by 4 bees and started feeling tight and closed up with breathing, light headed, nauseated. She called EMS. She was given oral benadryl by EMS and told to call us so she can get a new Epi Pen because hers is out of date. Dr. Gwendolyn Grant notified and he sent in new RX.

## 2012-01-03 NOTE — Telephone Encounter (Signed)
This encounter was created in error - please disregard.

## 2012-01-03 NOTE — Telephone Encounter (Signed)
Realized patient has had epi pen before. Dr. Earnest Bailey advises may give a refill. Still need to discuss reaction with patient. Will await call back from patient.

## 2012-01-07 ENCOUNTER — Ambulatory Visit (INDEPENDENT_AMBULATORY_CARE_PROVIDER_SITE_OTHER): Payer: Medicare Other | Admitting: Family Medicine

## 2012-01-07 ENCOUNTER — Encounter: Payer: Self-pay | Admitting: Family Medicine

## 2012-01-07 VITALS — BP 113/79 | HR 76 | Temp 98.2°F | Ht 71.0 in | Wt 272.0 lb

## 2012-01-07 DIAGNOSIS — T63461A Toxic effect of venom of wasps, accidental (unintentional), initial encounter: Secondary | ICD-10-CM

## 2012-01-07 DIAGNOSIS — T6391XA Toxic effect of contact with unspecified venomous animal, accidental (unintentional), initial encounter: Secondary | ICD-10-CM

## 2012-01-07 DIAGNOSIS — T63441A Toxic effect of venom of bees, accidental (unintentional), initial encounter: Secondary | ICD-10-CM | POA: Insufficient documentation

## 2012-01-07 NOTE — Patient Instructions (Signed)
Good to see you Keep your epi pen on you at all times You can continue benadryl if you need but it should get better Try ice 20 minutes when it itches up to 3 times a day.  Keep doing the benadryl lotion Watch out for any discharge or if you get a fever we should see you again but I do not think this should happen.

## 2012-01-07 NOTE — Assessment & Plan Note (Signed)
The patient arm no signs of infection at this time. Encourage patient to continue to monitor her and gave red flags and when to seek medical attention. Encourage patient to keep an EpiPen on her and up to date always. Patient has already had a refill of this done. Patient will followup as needed

## 2012-01-07 NOTE — Progress Notes (Signed)
Patient ID: Brianna Wheeler, female   DOB: 1956/07/06, 56 y.o.   MRN: [id number]   56 year old female who is coming in 3 days after a hornet wasp sting. Patient states that she was gardening and hornets decided to attack. Patient was stung a total of 6 times all on the right upper extremity. Patient does have an allergy to bees and her EpiPen was expired so she did not use it. Patient did feel somewhat faint and had her heart racing after she was stung but denies much swelling of the arm. Patient called fire department which didn't come out and help her spray been asked. Patient has been taking Benadryl since that time and feels like the arm is improving. Patient still has an area of redness on her right upper extremity just superior to the elbow at she wants looked at. Patient denies any discharge denies any fevers, chills, lightheadedness, or even pain in that area.  Review of systems as noted above in history of present illness  Physical exam: Vitals reviewed General: No apparent distress Cardiovascular: Regular in with a no murmur Pulmonary: Clear to auscultation bilaterally Right upper tremor he: Patient does have an area where appears to be 3 sting marks on the lateral upper aspect of her right arm just superior to the elbow. Patient does have what seems to be in histamine reaction surrounding these areas but no discharge no signs of infection. Do not see any foreign bodies left in the area as well. Other areas of noted still marks are the right wrist and one on the right forearm both no erythema surrounding the areas.

## 2012-01-15 ENCOUNTER — Ambulatory Visit (INDEPENDENT_AMBULATORY_CARE_PROVIDER_SITE_OTHER): Payer: Medicare Other | Admitting: Family Medicine

## 2012-01-15 ENCOUNTER — Encounter: Payer: Self-pay | Admitting: Family Medicine

## 2012-01-15 VITALS — BP 120/80 | HR 80 | Ht 71.0 in | Wt 274.0 lb

## 2012-01-15 DIAGNOSIS — T6391XA Toxic effect of contact with unspecified venomous animal, accidental (unintentional), initial encounter: Secondary | ICD-10-CM

## 2012-01-15 DIAGNOSIS — T63461A Toxic effect of venom of wasps, accidental (unintentional), initial encounter: Secondary | ICD-10-CM

## 2012-01-15 DIAGNOSIS — T63441A Toxic effect of venom of bees, accidental (unintentional), initial encounter: Secondary | ICD-10-CM

## 2012-01-15 DIAGNOSIS — L259 Unspecified contact dermatitis, unspecified cause: Secondary | ICD-10-CM

## 2012-01-15 MED ORDER — DESONIDE 0.05 % EX CREA: TOPICAL_CREAM | Freq: Two times a day (BID) | CUTANEOUS | Status: DC

## 2012-01-15 NOTE — Progress Notes (Signed)
  Subjective:    Patient ID: Brianna Wheeler, female    DOB: [birth year], 56 y.o.   MRN: [id number]  HPI 1.  FU for bee stings:  Her bee stings are healing well. However she now has had an allergic reaction to the paper tape that she was using to cover of 2 of the sites. She is having redness and itching where she had use paper tape. She has not used any further tape for the past 2 days. She's not had any further problem with the use, swelling of her lips, trouble breathing. She has refilled her EpiPen and keeps it on her person at all times.   Review of Systems See HPI above for review of systems.       Objective:   Physical Exam Gen:  Alert, cooperative patient who appears stated age in no acute distress.  Vital signs reviewed. Skin:  Healing papules on right upper extremity. About 3 total bites. She does have one that is scabbed over. However it is healing well. No signs of infection. She does have 2 areas of linear redness corresponding to prior application of paper tape across the lateral aspect of her right elbow. These do appear to be any a contact dermatitis.       Assessment & Plan:

## 2012-01-15 NOTE — Assessment & Plan Note (Signed)
Healing well. She is keeping her EpiPen on her person now.

## 2012-01-15 NOTE — Assessment & Plan Note (Signed)
Desonide for relief.   

## 2012-01-17 ENCOUNTER — Other Ambulatory Visit: Payer: Self-pay | Admitting: *Deleted

## 2012-01-18 MED ORDER — HYDROCHLOROTHIAZIDE 12.5 MG PO CAPS: 25.0000 mg | ORAL_CAPSULE | ORAL | Status: DC

## 2012-02-06 ENCOUNTER — Ambulatory Visit (INDEPENDENT_AMBULATORY_CARE_PROVIDER_SITE_OTHER): Payer: Medicare Other | Admitting: Family Medicine

## 2012-02-06 ENCOUNTER — Encounter: Payer: Self-pay | Admitting: Family Medicine

## 2012-02-06 VITALS — BP 118/77 | HR 90 | Temp 98.4°F | Ht 71.0 in | Wt 278.0 lb

## 2012-02-06 DIAGNOSIS — J069 Acute upper respiratory infection, unspecified: Secondary | ICD-10-CM | POA: Insufficient documentation

## 2012-02-06 MED ORDER — GUAIFENESIN ER 600 MG PO TB12: 1200.0000 mg | ORAL_TABLET | Freq: Two times a day (BID) | ORAL | Status: DC

## 2012-02-06 NOTE — Patient Instructions (Signed)
Dear Brianna Wheeler,   It was great to see you today. Thank you for coming to clinic. Please read below regarding the issues that we discussed.   1. At this point, I am not sure whether you have developed a viral upper respiratory infection or simply worsening of your allergies. Fortunately, therapy is similar.  2. For the pain and if you have another fever, you can take tylenol. At night time, you can take a Benadryl 25mg -this should help you sleep and relieve some of your symptoms. To help thin your mucus, I am going to send in for Mucinex to try to help this not develop into a bacterial sinus infection as it did back in May.  3. If you have fevers of greater than 101.5 over the next 2 days and develop pus like drainage from your nostrils or if your symptoms are not getting better at 10 days, or get better then get worse, please come back to our office to be reevaluated.   Please call earlier if you have any questions or concerns.   Sincerely,  Dr. Tana Conch

## 2012-02-07 NOTE — Progress Notes (Signed)
  Subjective:    Patient ID: Brianna Wheeler, female    DOB: February 11, 1956, 56 y.o.   MRN: [id number]  HPI  1. URI symptoms-On Tuesday, patient noted that her throat was burning, increased congestion, clear rhinorrhea, sinus pressure, watery itchy eyes, and a cough. Tuesday evening she developed a fever to 101. No diffuse body aches. Patient says she had a sinus infection several months ago and it felt very similar to this (8 days of symptoms before treatment at that time). Inciting event-patient reports going out into her yard on Monday to spray for bugs. She has not been out in the yard very much in the last 2 years and every time she does, she seems to have the above listed symptoms. Patient says nighttime is the worst for her symptoms. Hx allergic rhinitis and patient has not missed doses of allergra or flonase.   No flare of asthma symptoms-no SOB or wheeze reported.   Review of Systems -See HPI  Past Medical History-history of allergic rhinitis and allergies. Reviewed problem list.  Medications- reviewed and updated Chief complaint-noted    Objective:   Physical Exam  Constitutional: She is oriented to person, place, and time. She appears well-developed and well-nourished. No distress.  HENT:  Right Ear: Tympanic membrane is not injected and not bulging.  Left Ear: Tympanic membrane is not injected and not bulging.  Nose: Rhinorrhea and sinus tenderness (in all four sinuses) present. No mucosal edema.  Mouth/Throat: No oropharyngeal exudate.  Eyes: Conjunctivae and EOM are normal. Pupils are equal, round, and reactive to light.  Neck: Normal range of motion. Neck supple.  Cardiovascular: Normal rate and regular rhythm.  Exam reveals no gallop and no friction rub.   No murmur heard. Pulmonary/Chest: Effort normal and breath sounds normal. She has no wheezes. She has no rales.  Abdominal: Soft. Bowel sounds are normal.  Musculoskeletal: Normal range of motion. She exhibits no edema.    Lymphadenopathy:    She has cervical adenopathy.  Neurological: She is alert and oriented to person, place, and time.  Skin: Skin is warm and dry.      Assessment & Plan:

## 2012-02-07 NOTE — Assessment & Plan Note (Signed)
Atypical time course for viral URI-but believe most likely given lymph nodes and fever. No sick contacts. Possible allergic rhinitis component given exposure to outside and previous symptoms in similar settings-patient already on flonase and allegra and compliant. Symptomatic tx per AVS with red flags reviewed for return.

## 2012-02-11 ENCOUNTER — Encounter: Payer: Self-pay | Admitting: Family Medicine

## 2012-02-11 ENCOUNTER — Telehealth: Payer: Self-pay | Admitting: *Deleted

## 2012-02-11 ENCOUNTER — Encounter: Payer: Self-pay | Admitting: Internal Medicine

## 2012-02-11 ENCOUNTER — Ambulatory Visit (INDEPENDENT_AMBULATORY_CARE_PROVIDER_SITE_OTHER): Payer: Medicare Other | Admitting: Family Medicine

## 2012-02-11 VITALS — BP 110/68 | HR 76 | Temp 98.8°F | Wt 276.0 lb

## 2012-02-11 DIAGNOSIS — Z0000 Encounter for general adult medical examination without abnormal findings: Secondary | ICD-10-CM | POA: Insufficient documentation

## 2012-02-11 DIAGNOSIS — Z1211 Encounter for screening for malignant neoplasm of colon: Secondary | ICD-10-CM | POA: Insufficient documentation

## 2012-02-11 DIAGNOSIS — Z515 Encounter for palliative care: Secondary | ICD-10-CM | POA: Insufficient documentation

## 2012-02-11 DIAGNOSIS — J069 Acute upper respiratory infection, unspecified: Secondary | ICD-10-CM

## 2012-02-11 DIAGNOSIS — Z1231 Encounter for screening mammogram for malignant neoplasm of breast: Secondary | ICD-10-CM

## 2012-02-11 MED ORDER — AMOXICILLIN 875 MG PO TABS: 875.0000 mg | ORAL_TABLET | Freq: Two times a day (BID) | ORAL | Status: DC

## 2012-02-11 MED ORDER — TRAMADOL HCL 50 MG PO TABS: 100.0000 mg | ORAL_TABLET | Freq: Two times a day (BID) | ORAL | Status: DC

## 2012-02-11 NOTE — Telephone Encounter (Signed)
Pt called and given the appt dates and times. She was instructed to bring in all of her meds, insurance card, ID, and copay at time of her visit. Told that if she cannot keep this appt she will need to call their office 24 hours in advance to cancel/reschedule or she will be charged a fee. Pt voiced understanding and agreed.Brianna Wheeler [place]

## 2012-02-11 NOTE — Patient Instructions (Addendum)
I sent in the Amoxicillin for you.  If you're still feeling bad this week come back in and see me.    It was good to see you today, I hope you feel better.

## 2012-02-11 NOTE — Telephone Encounter (Signed)
Pt has appt with [facility] [address] [facility] 4th floor, phone # [phone]  for the following dates:  Nurse visit: Sept. 3.2013 @ 315 pm  Procedure visit: Sept. 16.2013 @ 1015 am

## 2012-02-11 NOTE — Progress Notes (Signed)
  Subjective:    Patient ID: Brianna Wheeler, female    DOB: 08-27-1955, 56 y.o.   MRN: [id number]  HPI  URI symptoms:  Present for past 10days.  Describes rhinorrhea, sinus congestion that is now worsening.  Cough at night.  Sore throat persists, but sinus congestion is her main complaint.  Has tried Mucinex prescribed last visit without relief.  Sick contacts are none.  No fevers or chills. No nausea or vomiting.     Review of Systems See HPI above for review of systems.       Objective:   Physical Exam  BP 110/68  Pulse 76  Temp 98.8 F (37.1 C) (Oral)  Wt 276 lb (125.193 kg) Gen:  Patient sitting on exam table, appears stated age in no acute distress Head: Normocephalic atraumatic Eyes: EOMI, PERRL, sclera and conjunctiva non-erythematous Nose:  Nasal turbinates grossly enlarged bilaterally. Some exudates noted. Tender to palpation of maxillary sinus  Mouth: Mucosa membranes moist. Tonsils +2, nonenlarged, non-erythematous.  Oropharynx pink and moist Neck: No cervical lymphadenopathy noted Heart:  RRR, no murmurs auscultated. Pulm:  Clear to auscultation bilaterally with good air movement.  No wheezes or rales noted.          Assessment & Plan:

## 2012-02-11 NOTE — Assessment & Plan Note (Signed)
Plan to treat due to length of symptoms and no improvement.   Will prescribe Amoxicillin x 7 days. Instructed patient to return in 1 week for checkup or sooner if worsening or no improvement.   

## 2012-02-11 NOTE — Telephone Encounter (Signed)
Called pt to find out if she has seen a GI doc in the past.Gordon Carlson, [place]

## 2012-02-11 NOTE — Assessment & Plan Note (Signed)
Needs mammogram and colonoscopy.   Will put in orders today.

## 2012-02-12 ENCOUNTER — Ambulatory Visit (INDEPENDENT_AMBULATORY_CARE_PROVIDER_SITE_OTHER): Payer: Medicare Other | Admitting: Home Health Services

## 2012-02-12 ENCOUNTER — Encounter: Payer: Self-pay | Admitting: Home Health Services

## 2012-02-12 ENCOUNTER — Other Ambulatory Visit: Payer: Self-pay | Admitting: Home Health Services

## 2012-02-12 VITALS — BP 113/77 | HR 73 | Temp 98.1°F | Ht 71.0 in | Wt 277.8 lb

## 2012-02-12 DIAGNOSIS — Z0000 Encounter for general adult medical examination without abnormal findings: Secondary | ICD-10-CM

## 2012-02-12 NOTE — Patient Instructions (Signed)
1. Schedule eye exam. 2. Complete the stool card and bring back to [place]. 3. Schedule a mammogram. 4. Continue to work on weight loss, by walking daily. 5. Consider joining Geophysicist/field seismologist program at [facility]. 6. Continue to focus on eating 3-5 fruits and vegetables a day.

## 2012-02-12 NOTE — Telephone Encounter (Signed)
Pt needs generic refill for Flonase sent to [facility] in [place].

## 2012-02-12 NOTE — Progress Notes (Signed)
Patient here for annual wellness visit, patient reports: Risk Factors/Conditions needing evaluation or treatment: Pt does not have any new risk factors that need evaluation.  Home Safety: Pt lives with handicapped son, Arlys John, in 1 story home.  Pt reports having smoke detectors. Other Information: Corrective lens: Pt wears corretive lens for reading.  Has cataract in left eye, is waiting for $ to get surgery.  Dentures: Pt does not have dentures nor a regular dentist. Memory: Pt denies any memory problems. Patient's Mini Mental Score (recorded in doc. flowsheet): 30 Pt denied any hearing problems or ringing in the ears.   Diabetic Foot Check -  Appearance - no lesions, ulcers or calluses Skin - no unusual pallor or redness Monofilament testing -  Right - Great toe, medial, central, lateral ball and posterior foot intact Left - Great toe, medial, central, lateral ball and posterior foot intact   Balance/Gait:  Balance Abnormal Patient value  Sitting balance    Sit to stand x Uses arms  Attempts to arise    Immediate standing balance    Standing balance    Nudge    Eyes closed- Romberg    Tandem stance x dizzy  Back lean    Neck Rotation    360 degree turn    Sitting down x Uses arms   Gait Abnormal Patient value  Initiation of gait    Step length-left    Step length-right    Step height-left    Step height-right    Step symmetry    Step continuity    Path deviation    Trunk movement    Walking stance        Annual Wellness Visit Requirements Recorded Today In  Medical, family, social history Past Medical, Family, Social History Section  Current providers Care team  Current medications Medications  Wt, BP, Ht, BMI Vital signs  Hearing assessment (welcome visit) notes  Tobacco, alcohol, illicit drug use History  ADL Nurse Assessment  Depression Screening Nurse Assessment  Cognitive impairment Nurse Assessment  Mini Mental Status Document Flowsheet  Fall Risk  Nurse Assessment  Home Safety Progress Note  End of Life Planning (welcome visit) Social Documentation  Medicare preventative services Progress Note  Risk factors/conditions needing evaluation/treatment Progress Note  Personalized health advice Patient Instructions, goals, letter  Diet & Exercise Social Documentation  Emergency Contact Social Documentation  Seat Belts Social Documentation  Sun exposure/protection Social Documentation    Prevention Plan:   Recommended Medicare Prevention Screenings Women over 65 Test For Frequency Date of Last- BOLD if needed  Breast Cancer 1-2 yrs 12/10  Cervical Cancer 1-3 yrs recommended at next ov  Colorectal Cancer 1-10 yrs discussed-pt took home hemoccult card  Osteoporosis once NI-due to age  Cholesterol 5 yrs 2/13  Diabetes yearly 5/13  HIV yearly declined  Influenza Shot yearly 10/12  Pneumonia Shot once NI-due to age  Zostavax Shot once NI-due to age

## 2012-02-13 ENCOUNTER — Encounter: Payer: Self-pay | Admitting: Home Health Services

## 2012-02-13 ENCOUNTER — Other Ambulatory Visit: Payer: Self-pay | Admitting: *Deleted

## 2012-02-13 MED ORDER — FLUTICASONE PROPIONATE 50 MCG/ACT NA SUSP: 1.0000 | Freq: Two times a day (BID) | NASAL | Status: DC

## 2012-02-13 NOTE — Progress Notes (Signed)
Patient ID: Brianna Wheeler, female   DOB: 02-Apr-1956, 56 y.o.   MRN: [id number] Addendum: I have reviewed this visit and discussed with Brianna Wheeler and agree with her documentation.

## 2012-02-13 NOTE — Addendum Note (Signed)
Addended byGwendolyn Grant, Newt Lukes on: 02/13/2012 09:20 AM   Modules accepted: Orders

## 2012-02-14 MED ORDER — FLUTICASONE PROPIONATE 50 MCG/ACT NA SUSP: 1.0000 | Freq: Two times a day (BID) | NASAL | Status: DC

## 2012-02-20 ENCOUNTER — Ambulatory Visit (INDEPENDENT_AMBULATORY_CARE_PROVIDER_SITE_OTHER): Payer: Medicare Other | Admitting: Family Medicine

## 2012-02-20 VITALS — BP 118/78 | HR 80 | Wt 281.0 lb

## 2012-02-20 DIAGNOSIS — R21 Rash and other nonspecific skin eruption: Secondary | ICD-10-CM | POA: Insufficient documentation

## 2012-02-20 MED ORDER — PREDNISONE 20 MG PO TABS: ORAL_TABLET | ORAL | Status: DC

## 2012-02-20 NOTE — Assessment & Plan Note (Signed)
Will discontinue amoxicillin.  May be the source of rash.  Patient started on Prednisone 60 mg daily for 7 days. Continue cool compresses and benadryl.  Follow up in 5 days.  Patient told to go to ED/call 911 if she develops SOB, difficulty breathing. Patient warned that Prednisone may increase blood sugar, and may cause insomnia and nervousness.

## 2012-02-20 NOTE — Progress Notes (Signed)
Subjective:     Patient ID: Brianna Wheeler, female   DOB: [birth year], 56 y.o.   MRN: [id number]  HPI  56 year old female presents with rash.  1) Rash - Rash only on face. Patient feels like face is swollen. - Noticed this morning around 6 am.   - Patient took 1 benadryl, and used cold compress with some relief. - Recently given Amoxicillin for upper respiratory infection. - No other medication changes.   Review of Systems No SOB. Significant itching associated with rash.    Objective:   Physical Exam General: Alert and oriented. NAD. Well appearing. Heart:  RRR. No murmurs, rubs, gallops. Chest: CTAB. Skin: erythematous, papular rash on face.  Predominantly located on left face and periorbital region.     Assessment:      Plan:

## 2012-02-20 NOTE — Patient Instructions (Addendum)
Take Prednisone 20 mg, 3 pills once a day (60 mg a day) for 7 days.  Make a follow up appointment in 5 days.  If the rash has gone away you can cancel your appointment.  Continue to take the benadryl and use the cool compresses.  STOP taking the amoxicillin.

## 2012-02-25 ENCOUNTER — Ambulatory Visit (INDEPENDENT_AMBULATORY_CARE_PROVIDER_SITE_OTHER): Payer: Medicare Other | Admitting: Family Medicine

## 2012-02-25 ENCOUNTER — Encounter: Payer: Self-pay | Admitting: Home Health Services

## 2012-02-25 VITALS — BP 123/70 | HR 71 | Ht 71.0 in | Wt 282.0 lb

## 2012-02-25 DIAGNOSIS — B372 Candidiasis of skin and nail: Secondary | ICD-10-CM

## 2012-02-25 DIAGNOSIS — R300 Dysuria: Secondary | ICD-10-CM | POA: Insufficient documentation

## 2012-02-25 LAB — POCT URINALYSIS DIPSTICK
Glucose, UA: NEGATIVE
Leukocytes, UA: NEGATIVE
Nitrite, UA: NEGATIVE
Spec Grav, UA: 1.02
Urobilinogen, UA: 0.2

## 2012-02-25 MED ORDER — FLUCONAZOLE 150 MG PO TABS: 150.0000 mg | ORAL_TABLET | Freq: Once | ORAL | Status: AC

## 2012-02-25 NOTE — Assessment & Plan Note (Signed)
Likely yeast candidiasis in groin -- however obtaining UA due to increased frequency.  Will call with results

## 2012-02-25 NOTE — Patient Instructions (Signed)
Take the Diflucan 1 pill today and again in 3 days (THursday).   I will call you if the urine test shows anything.   I'm sorry you still have a rash!

## 2012-02-25 NOTE — Progress Notes (Signed)
  Subjective:    Patient ID: Brianna Wheeler, female    DOB: 05-25-1956, 56 y.o.   MRN: [id number]  HPI 1.  Rash:  Seen recently here for concern for rash secondary to amoxicillin.  She stopped taking the amoxicillin and was put on prednisone.  That cleared her original rash, but now she is having worsening rash noted on flexural regions of elbows and intertriginous areas of stomach and groin.  Very itchy.  Is taking Benadryl which relieves her itching but has not gotten a rash.  2.  Dysuria:  Present for the past 3 days. She describes burning and increased frequency. No hematuria noted. Does not have history of recurrent UTIs. No back pain. No fevers or chills. No nausea vomiting. No abdominal pain.   Review of Systems See HPI above for review of systems.       Objective:   Physical Exam  Gen:  Alert, cooperative patient who appears stated age in no acute distress.  Vital signs reviewed. Cardiac:  Regular rate and rhythm without murmur auscultated.  Good S1/S2. Pulm:  Clear to auscultation bilaterally with good air movement.  No wheezes or rales noted.   Skin:  Multiple papules scattered across pressure areas bilateral elbows. She also has some macular erythema and maceration intertriginous areas of her belly. Deferred groin exam secondary to patient request. Abd:  Soft/nondistended/nontender.  Good bowel sounds throughout all four quadrants.  No masses noted.       Assessment & Plan:

## 2012-02-25 NOTE — Assessment & Plan Note (Signed)
Plan to treat with oral fluconazole as this is spreading to multiple areas.

## 2012-02-25 NOTE — Progress Notes (Signed)
Subjective:     Patient ID: Brianna Wheeler, female   DOB: 10/08/1955, 56 y.o.   MRN: [id number]  HPI Brianna Wheeler is 56 y/o woman with a history of DMII, hypertension, and hyperlipidemia who comes into clinic today for follow up of a rash.   She was seen in clinic last week for a rash on her face. She was given benadryl and prednisone at the time. Since then the rash on her face has resolved, but she has a new rash on her chest and body. She reports the rash is extremely itchy, even with taking the benadryl.   She also states that she is also experiencing burning with urination that started Saturday. She notes increased urinary urgency as well.   She denies fever, shortness of breath, or pain in her chest or abdomen.   Review of Systems As per above.     Objective:   Physical Exam General: Well appearing, no acute distress Skin: There is an erythematous, macular rash over the intertriginous areas of her abdomen and her groin. There is also an erythematous, papular rash over the flexural surfaces of both arms.     Assessment/Plan  1. Rash: Most likely candidal intertrigo given recent course of prednisone. Will give prescription for oral fluconazole.  2. Dysuria: UA shows

## 2012-02-25 NOTE — Telephone Encounter (Signed)
Opened in error

## 2012-02-26 ENCOUNTER — Telehealth: Payer: Self-pay | Admitting: Family Medicine

## 2012-02-26 NOTE — Telephone Encounter (Signed)
Patient is returning call to Dr. Gwendolyn Grant.  She was given the results but then had more questions and wants Dr. Gwendolyn Grant to call her back.

## 2012-02-26 NOTE — Telephone Encounter (Signed)
Called and had to leave voicemail.  Wanted to give results of normal urine test.

## 2012-02-29 ENCOUNTER — Ambulatory Visit (HOSPITAL_COMMUNITY): Admission: RE | Admit: 2012-02-29 | Payer: Medicare Other | Source: Ambulatory Visit

## 2012-02-29 ENCOUNTER — Ambulatory Visit (INDEPENDENT_AMBULATORY_CARE_PROVIDER_SITE_OTHER): Payer: Medicare Other | Admitting: Family Medicine

## 2012-02-29 VITALS — BP 140/70 | HR 88 | Temp 98.5°F | Ht 71.0 in | Wt 281.5 lb

## 2012-02-29 DIAGNOSIS — R21 Rash and other nonspecific skin eruption: Secondary | ICD-10-CM

## 2012-02-29 MED ORDER — PERMETHRIN 5 % EX CREA: TOPICAL_CREAM | Freq: Once | CUTANEOUS | Status: AC

## 2012-02-29 MED ORDER — TRIAMCINOLONE ACETONIDE 0.5 % EX CREA: TOPICAL_CREAM | Freq: Three times a day (TID) | CUTANEOUS | Status: DC

## 2012-02-29 NOTE — Telephone Encounter (Signed)
Message left to return call.

## 2012-02-29 NOTE — Progress Notes (Signed)
S: Pt comes in today for SDA for continued rash.  Was seen 7/31 and given prednisone for rash on her face, thought to be possibly 2/2 amoxicillin she was started on.  Was seen again 02/25/12 for continued rash, this time on her body, which was diagnosed as candidal infection and she was given diflucan on 02/25/12 x2 (second dose was yesterday).  The only thing that pt can think of is that she was under her house, near insulation, around the time that the rash started.  Rash is over arms, trunk, groin, very top of thighs.  Very itchy, despite benadryl.  Rash seems to be getting worse with regards to itching as well as more red on left side of stomach, in groin, and in antecubital fossa bilaterally.  Skin feels warm.  Has been using cool water to help, which doesn't seem to help.  Has not used anything topical.  No history of eczema or sensitive skin.  Never has had rash like this before.  No fevers/chills, doesn't feel like she's sick.  Worst areas of rash seem to get more warm and itchy as the day goes on. No N/V/D, no rhinorrhea/cough.     ROS: Per HPI  History  Smoking status  . Never Smoker   Smokeless tobacco  . Never Used    O:  Filed Vitals:   02/29/12 1425  BP: 140/70  Pulse: 88  Temp: 98.5 F (36.9 C)    Gen: NAD CV: RRR, no murmur Pulm: CTA bilat, no wheezes or crackles Abd: soft, NT Ext: substantial macular, erythematous rash with obvious excoriation most significant over bilateral antecubital fossa, as well as over left side/mid-abdomen, right inferior pannus, right upper thigh, left chest/neck/clavicular area, and inside umbilicus; also with some scattered papules (no pustules) in these areas as well.   A/P: 56 y.o. female p/w rash, ?irritant dermatitis vs atypical scabies distribution  -See problem list -f/u if not improving

## 2012-02-29 NOTE — Telephone Encounter (Signed)
Spoke with patient. Continues having severe itching with rash she has had for 10-11 days.  Diflucan did not help. It is "getting on my nerves "she states. Attempted paging Dr. Gwendolyn Grant but then realized he is on vacation today.  Appointment scheduled today for follow up.

## 2012-02-29 NOTE — Patient Instructions (Addendum)
It was good to see you today!  I'm sorry you're still having issues with this rash!  We aren't 100% sure what is causing the rash, but it is obviously very itchy.  I'm going to have you treat yourself for possible scabies (although I doubt that's what this is), just in case.  Use the medicine from head to toe tonight.  Then, rise it off tomorrow and start using the topical steroid cream on all the itchy areas.  You should also take a non-sedating medicine that will help with the itching.  Come back if this doesn't help!!   Scabies Scabies are small bugs (mites) that burrow under the skin and cause red bumps and severe itching. These bugs can only be seen with a microscope. Scabies are highly contagious. They can spread easily from person to person by direct contact. They are also spread through sharing clothing or linens that have the scabies mites living in them. It is not unusual for an entire family to become infected through shared towels, clothing, or bedding.  HOME CARE INSTRUCTIONS   Your caregiver may prescribe a cream or lotion to kill the mites. If this cream is prescribed; massage the cream into the entire area of the body from the neck to the bottom of both feet. Also massage the cream into the scalp and face if your child is less than 86 year old. Avoid the eyes and mouth.   Leave the cream on for 8 to12 hours. Do not wash your hands after application. Your child should bathe or shower after the 8 to 12 hour application period. Sometimes it is helpful to apply the cream to your child at right before bedtime.   One treatment is usually effective and will eliminate approximately 95% of infestations. For severe cases, your caregiver may decide to repeat the treatment in 1 week. Everyone in your household should be treated with one application of the cream.   New rashes or burrows should not appear after successful treatment within 24 to 48 hours; however the itching and rash may last  for 2 to 4 weeks after successful treatment. If your symptoms persist longer than this, see your caregiver.   Your caregiver also may prescribe a medication to help with the itching or to help the rash go away more quickly.   Scabies can live on clothing or linens for up to 3 days. Your entire child's recently used clothing, towels, stuffed toys, and bed linens should be washed in hot water and then dried in a dryer for at least 20 minutes on high heat. Items that cannot be washed should be enclosed in a plastic bag for at least 3 days.   To help relieve itching, bathe your child in a cool bath or apply cool washcloths to the affected areas.   Your child may return to school after treatment with the prescribed cream.  SEEK MEDICAL CARE IF:   The itching persists longer than 4 weeks after treatment.   The rash spreads or becomes infected (the area has red blisters or yellow-tan crust).  Document Released: 07/09/2005 Document Revised: 06/28/2011 Document Reviewed: 11/17/2008 [facility] Patient Information 2012 [place], [place].

## 2012-02-29 NOTE — Telephone Encounter (Signed)
Patient is calling because she has finished taking the medication prescribed for the rash, but it isn't getting any better.  She has tried taking baths with Aveeno and that didn't help.  She is very uncomfortable and she would like to speak to Dr. Gwendolyn Grant about what else can be done.

## 2012-02-29 NOTE — Telephone Encounter (Signed)
Pt is asking to speak with the nurse

## 2012-03-01 NOTE — Assessment & Plan Note (Signed)
No clear cut reason for rash, but relatively substantial distribution.  While not in the typical pattern, could argue there are some scabies-like areas.  Will treat with permethrin x1 dose then start using topical steroid over irritated area.  Will have pt restart home allegra as non-drowsy antihistamine to hopefully help with some of the itching to decrease scratching/excoriations.  Rec'd cool baths to help with itching. No systemic symptoms or airway/throat involvement.  Red flags for return discussed with pt.

## 2012-03-18 ENCOUNTER — Telehealth: Payer: Self-pay | Admitting: Family Medicine

## 2012-03-18 NOTE — Telephone Encounter (Signed)
Called and discussed the change in Metformin. She was given the following information:  DIABETES MELLITUS, TYPE II, CONTROLLED Brianna Don, MD 08/07/2011 5:01 PM Signed  Still with well-controlled A1c of 5.4.  She wants to try 3 months off of metformin to do this now.  Pt stated that she had forgotten about doing this and will D/C the metformin and will come back in 3 mos to follow up at that time.[name]

## 2012-03-18 NOTE — Telephone Encounter (Signed)
Patient is calling because the form for her diabetic supplies was completed and sent in to her supplier but there was a notation about her not being a diabetic and she doesn't understand why.  She said that she would like Dr. Gwendolyn Grant to explain why she has been testing her sugars several times a day and why she is on Metformin if she isn't a diabetic.

## 2012-03-24 ENCOUNTER — Other Ambulatory Visit: Payer: Self-pay | Admitting: Family Medicine

## 2012-03-27 ENCOUNTER — Ambulatory Visit (HOSPITAL_COMMUNITY): Payer: Medicare Other | Attending: Family Medicine

## 2012-04-07 ENCOUNTER — Encounter: Payer: Medicare Other | Admitting: Internal Medicine

## 2012-04-10 ENCOUNTER — Ambulatory Visit: Payer: Medicare Other

## 2012-04-14 ENCOUNTER — Encounter: Payer: Self-pay | Admitting: Family Medicine

## 2012-04-14 ENCOUNTER — Ambulatory Visit (INDEPENDENT_AMBULATORY_CARE_PROVIDER_SITE_OTHER): Payer: Medicare Other | Admitting: Family Medicine

## 2012-04-14 VITALS — BP 123/82 | HR 79 | Temp 98.8°F | Ht 71.0 in | Wt 286.5 lb

## 2012-04-14 DIAGNOSIS — J069 Acute upper respiratory infection, unspecified: Secondary | ICD-10-CM

## 2012-04-14 NOTE — Progress Notes (Signed)
  Subjective:    Patient ID: Brianna Wheeler, female    DOB: 11/15/1955, 56 y.o.   MRN: [id number]  HPI  URI symptoms:  Present for past 5  days.  Describes rhinorrhea, sinus congestion, mild cough.  Has tried nothing because she was not sure what to take.  Sick contacts are her son, who has been sick for almost 2 weeks with similar symptoms, but his are much worse.  No fevers or chills. No nausea or vomiting.     Review of Systems See HPI above for review of systems.       Objective:   Physical Exam  BP 123/82  Pulse 79  Temp 98.8 F (37.1 C) (Oral)  Ht 5\' 11"  (1.803 m)  Wt 286 lb 8 oz (129.956 kg)  BMI 39.96 kg/m2 Gen:  Patient sitting on exam table, appears stated age in no acute distress Head: Normocephalic atraumatic Eyes: EOMI, PERRL, sclera and conjunctiva non-erythematous Nose:  Nasal turbinates grossly enlarged bilaterally. Some exudates noted. Sinuses nontender to palpation Mouth: Mucosa membranes moist. Tonsils +2, nonenlarged, non-erythematous. Neck: No cervical lymphadenopathy noted Heart:  RRR, no murmurs auscultated. Pulm:  Clear to auscultation bilaterally with good air movement.  No wheezes or rales noted.          Assessment & Plan:

## 2012-04-14 NOTE — Patient Instructions (Signed)

## 2012-04-14 NOTE — Assessment & Plan Note (Signed)
Likely viral illness based on symptoms and history.  No signs of bacterial illness. Symptomatic treatment for now, return if worsening, otherwise return for recheck in 1 week.

## 2012-04-15 ENCOUNTER — Ambulatory Visit: Payer: Medicare Other

## 2012-04-17 ENCOUNTER — Encounter: Payer: Self-pay | Admitting: Family Medicine

## 2012-04-17 ENCOUNTER — Ambulatory Visit (INDEPENDENT_AMBULATORY_CARE_PROVIDER_SITE_OTHER): Payer: Medicare Other | Admitting: Family Medicine

## 2012-04-17 VITALS — BP 110/65 | HR 77 | Temp 98.2°F | Ht 71.0 in | Wt 286.4 lb

## 2012-04-17 DIAGNOSIS — J069 Acute upper respiratory infection, unspecified: Secondary | ICD-10-CM

## 2012-04-17 DIAGNOSIS — Z23 Encounter for immunization: Secondary | ICD-10-CM

## 2012-04-17 DIAGNOSIS — E119 Type 2 diabetes mellitus without complications: Secondary | ICD-10-CM

## 2012-04-17 NOTE — Progress Notes (Signed)
  Subjective:    Patient ID: Brianna Wheeler, female    DOB: August 29, 1955, 56 y.o.   MRN: [id number]  HPI  URI symptoms:  Persists.  She is using her Flonase as prescibed.  She is also using Claritin with only minimal relief.  Describes rhinorrhea, sinus congestion, mild cough.  Sick contacts are her son, who is recovering from his URI symptoms.  No fevers or chills. No nausea or vomiting.  Eating and drinking well.     Review of Systems See HPI above for review of systems.       Objective:   Physical Exam BP 110/65  Pulse 77  Temp 98.2 F (36.8 C) (Oral)  Ht 5\' 11"  (1.803 m)  Wt 286 lb 6.4 oz (129.91 kg)  BMI 39.94 kg/m2 Gen:  Patient sitting on exam table, appears stated age in no acute distress, comfortable appearing.   Head: Normocephalic atraumatic Eyes: EOMI, PERRL, sclera and conjunctiva non-erythematous Nose:  Nasal turbinates grossly enlarged bilaterally. Some clear drainage noted. Mouth: Mucosa membranes moist. Tonsils +2, nonenlarged, non-erythematous. Neck: No cervical lymphadenopathy noted Heart:  RRR, no murmurs auscultated. Pulm:  Clear to auscultation bilaterally with good air movement.  No wheezes or rales noted.           Assessment & Plan:

## 2012-04-17 NOTE — Patient Instructions (Addendum)
I think you have a cold.  Keep doing what you're doing.  It will run its course and you will get better.

## 2012-04-17 NOTE — Assessment & Plan Note (Signed)
Still believe most likely viral illness. Patient not ill appearing.   Symptomatic treatment for now, return if worsening or no improvement in 1 week.  Recommended nasal saline spray in addition to current regimen.

## 2012-04-29 ENCOUNTER — Ambulatory Visit (INDEPENDENT_AMBULATORY_CARE_PROVIDER_SITE_OTHER): Payer: Medicare Other | Admitting: Family Medicine

## 2012-04-29 ENCOUNTER — Encounter: Payer: Self-pay | Admitting: Family Medicine

## 2012-04-29 VITALS — BP 139/81 | HR 80 | Temp 98.4°F | Ht 71.0 in | Wt 284.0 lb

## 2012-04-29 DIAGNOSIS — R300 Dysuria: Secondary | ICD-10-CM

## 2012-04-29 LAB — POCT URINALYSIS DIPSTICK
Ketones, UA: NEGATIVE
Protein, UA: NEGATIVE
Spec Grav, UA: 1.015

## 2012-04-29 LAB — POCT UA - MICROSCOPIC ONLY

## 2012-04-29 MED ORDER — CEPHALEXIN 500 MG PO CAPS: 500.0000 mg | ORAL_CAPSULE | Freq: Two times a day (BID) | ORAL | Status: DC

## 2012-04-29 MED ORDER — CEPHALEXIN 500 MG PO CAPS: 500.0000 mg | ORAL_CAPSULE | Freq: Four times a day (QID) | ORAL | Status: DC

## 2012-04-29 NOTE — Assessment & Plan Note (Signed)
Dipstick with blood and leuks- Rx for keflex 500 mg po bid x 1 week.

## 2012-04-29 NOTE — Patient Instructions (Signed)
Urinary Tract Infection  A urinary tract infection (UTI) is often caused by a germ (bacteria). A UTI is usually helped with medicine (antibiotics) that kills germs. Take all the medicine until it is gone. Do this even if you are feeling better. You are usually better in 7 to 10 days.  HOME CARE    Drink enough water and fluids to keep your pee (urine) clear or pale yellow. Drink:   Cranberry juice.   Water.   Avoid:   Caffeine.   Tea.   Bubbly (carbonated) drinks.   Alcohol.   Only take medicine as told by your doctor.   To prevent further infections:   Pee often.   After pooping (bowel movement), women should wipe from front to back. Use each tissue only once.   Pee before and after having sex (intercourse).  Ask your doctor when your test results will be ready. Make sure you follow up and get your test results.   GET HELP RIGHT AWAY IF:    There is very bad back pain or lower belly (abdominal) pain.   You get the chills.   You have a fever.   Your baby is older than 3 months with a rectal temperature of 102 F (38.9 C) or higher.   Your baby is 3 months old or younger with a rectal temperature of 100.4 F (38 C) or higher.   You feel sick to your stomach (nauseous) or throw up (vomit).   There is continued burning with peeing.   Your problems are not better in 3 days. Return sooner if you are getting worse.  MAKE SURE YOU:    Understand these instructions.   Will watch your condition.   Will get help right away if you are not doing well or get worse.  Document Released: 12/26/2007 Document Revised: 10/01/2011 Document Reviewed: 12/26/2007  [employer] Patient Information 2013 [employer].

## 2012-04-29 NOTE — Progress Notes (Signed)
  Subjective:    Patient ID: Brianna Wheeler, female    DOB: 09-12-1955, 56 y.o.   MRN: [id number]  HPI  Brianna Wheeler comes in with dysuria x 3 days.  She says that every time she urinates it burns, and her right mid back is hurting too, the back pain feels different from her regular back pain.  She says she does not drink enough water, knows she needs to drink more.  Denies fevers/chills/nausea/vomiting.    Review of Systems See HPI    Objective:   Physical Exam BP 139/81  Pulse 80  Temp 98.4 F (36.9 C) (Oral)  Ht 5\' 11"  (1.803 m)  Wt 284 lb (128.822 kg)  BMI 39.61 kg/m2 General appearance: alert, cooperative and no distress Back: symmetric, no curvature. ROM normal. No CVA tenderness. Abdomen: soft, non-tender; bowel sounds normal; no masses,  no organomegaly       Assessment & Plan:

## 2012-05-09 ENCOUNTER — Telehealth: Payer: Self-pay | Admitting: Family Medicine

## 2012-05-09 NOTE — Telephone Encounter (Signed)
Patient is calling because she lost her balance and fell onto concrete and hurt her back.  She would like to know what else she needs to do other than take Tramadol and ice her back.

## 2012-05-09 NOTE — Telephone Encounter (Signed)
Spoke with patient . States she fell approx 2  hours ago today. Has been using heating pad. Recommended she use ice  to start with. Is taking tramadol for pain. Advised rest  and if pain worsens  to go to Urgent Care  over the weekend.

## 2012-05-09 NOTE — Telephone Encounter (Signed)
Agree with plan.  Appreciate care.

## 2012-05-09 NOTE — Telephone Encounter (Addendum)
Message left to return call.

## 2012-05-25 ENCOUNTER — Other Ambulatory Visit: Payer: Self-pay | Admitting: Family Medicine

## 2012-06-05 ENCOUNTER — Ambulatory Visit: Payer: Medicare Other

## 2012-06-10 ENCOUNTER — Encounter: Payer: Self-pay | Admitting: Family Medicine

## 2012-06-10 ENCOUNTER — Ambulatory Visit (INDEPENDENT_AMBULATORY_CARE_PROVIDER_SITE_OTHER): Payer: Medicare Other | Admitting: Family Medicine

## 2012-06-10 VITALS — BP 117/82 | HR 71 | Temp 98.8°F | Wt 285.8 lb

## 2012-06-10 DIAGNOSIS — M549 Dorsalgia, unspecified: Secondary | ICD-10-CM | POA: Insufficient documentation

## 2012-06-10 DIAGNOSIS — IMO0002 Reserved for concepts with insufficient information to code with codable children: Secondary | ICD-10-CM

## 2012-06-10 MED ORDER — KETOROLAC TROMETHAMINE 60 MG/2ML IJ SOLN
60.0000 mg | Freq: Once | INTRAMUSCULAR | Status: AC
  Administered 2012-06-10: 60 mg via INTRAMUSCULAR

## 2012-06-10 MED ORDER — HYDROCODONE-ACETAMINOPHEN 5-325 MG PO TABS: 1.0000 | ORAL_TABLET | Freq: Three times a day (TID) | ORAL | Status: DC | PRN

## 2012-06-10 NOTE — Progress Notes (Signed)
  Subjective:    Patient ID: Brianna Wheeler, female    DOB: 02-Mar-1956, 56 y.o.   MRN: [id number]  HPI  1.  Back pain:  Patient began experiencing back pain x 1 day.  Describes as dull aching pain in bilateral lumbar region but worse on Right side alternating with sharp stabbing pain when she moves from sitting to standing or bends over.  States this is typical of usual back pain she has when her back "goes out."  No dysuria, hematuria, urinary frequency, radiation of pain to legs, motor weakness, decreased sensation, or headaches.  No fevers or chills.  No bladder/bowel incontinence or saddle anesthesia.      Review of Systems See HPI above for review of systems.       Objective:   Physical Exam Back:  Normal skin, Spine with normal alignment and no deformity.  No tenderness to vertebral process palpation.  Paraspinous muscles are tender with Right sided spasm noted.  Straight leg raise is positive for Right sided back pain.  Neuro:  Sensation and motor function 5/5 bilateral lower extremities.  Patellar and Achilles  DTR's +2 patellar BL         Assessment & Plan:

## 2012-06-10 NOTE — Patient Instructions (Addendum)
You have a muscle strain in your back.   Use the Flexeril as a muscle relaxer.   Take the hydrocodone for relief of your back pain when you need it.   Do not drive after taking these medicines.   After a few days or after you run out, you can switch back to Tramadol if you need it.  Keep doing the hot soaks.   Heating pads also work well.    Come back in about 7 - 10 days if you're still having problems or sooner if its getting worse.    I hope you feel better and have a good Thanksgiving!

## 2012-06-10 NOTE — Assessment & Plan Note (Signed)
Acute muscle strain/spasm.  Heat and short term hydrocodone as she is only having minimal relief from Tramadol. See Instructions.  FU in 7 days if no improvement or sooner if worsening.

## 2012-07-09 ENCOUNTER — Telehealth: Payer: Self-pay | Admitting: Family Medicine

## 2012-07-09 NOTE — Telephone Encounter (Signed)
Having a bad headache and tramadol doesn't seem to help - wants to know what else she can take

## 2012-07-09 NOTE — Telephone Encounter (Signed)
Patient states she started with a bad headache on Sat and has continued . She thinks it may due to tension and stress. Wonders if she can take tylenol with the tramadol. Advised she can take tylenol. Call back if needs appointment.

## 2012-07-21 ENCOUNTER — Other Ambulatory Visit: Payer: Self-pay | Admitting: Family Medicine

## 2012-07-23 ENCOUNTER — Other Ambulatory Visit: Payer: Self-pay | Admitting: Family Medicine

## 2012-07-24 ENCOUNTER — Encounter: Payer: Self-pay | Admitting: Family Medicine

## 2012-07-24 ENCOUNTER — Ambulatory Visit (INDEPENDENT_AMBULATORY_CARE_PROVIDER_SITE_OTHER): Payer: Medicare Other | Admitting: Family Medicine

## 2012-07-24 VITALS — BP 131/82 | HR 80 | Temp 98.3°F | Ht 71.0 in | Wt 289.0 lb

## 2012-07-24 DIAGNOSIS — M79609 Pain in unspecified limb: Secondary | ICD-10-CM

## 2012-07-24 DIAGNOSIS — M79672 Pain in left foot: Secondary | ICD-10-CM

## 2012-07-24 DIAGNOSIS — F3289 Other specified depressive episodes: Secondary | ICD-10-CM

## 2012-07-24 DIAGNOSIS — F329 Major depressive disorder, single episode, unspecified: Secondary | ICD-10-CM

## 2012-07-24 MED ORDER — TRAZODONE HCL 150 MG PO TABS: 150.0000 mg | ORAL_TABLET | Freq: Every day | ORAL | Status: DC

## 2012-07-24 NOTE — Patient Instructions (Addendum)
You don't need an appointment for the foot x-ray.   [facility] and they can do it.  Call [facility].  Take the Trazodone 1 pill at night time.    Come back in 7 - 10 days so we can see how you're doing.

## 2012-07-25 DIAGNOSIS — M79672 Pain in left foot: Secondary | ICD-10-CM | POA: Insufficient documentation

## 2012-07-25 NOTE — Assessment & Plan Note (Signed)
Still in time frame for grieving period. She desires treatment.   Increasing Trazodone to depression treatment dose.   FU in 7 - 10 days. She is to call [facility] back for counseling and help with the grieving process since she should have a year's worth of grief counseling with them already established.  If for some reason cannot be followed by [facility]osp[facility] will refer to another psychiatric office for grief counseling.

## 2012-07-25 NOTE — Progress Notes (Signed)
  Subjective:    Patient ID: Brianna Wheeler, female    DOB: 22-Dec-1955, 57 y.o.   MRN: [id number]  HPI  1.  Depression:  Persists, grieving after death of her husband this past April. She and her husband had been together for 36 years. She states these greatly increased during the holidays especially Christmas and new years when his family came to visit. She was receiving some counseling to hospice with has not been contacted by them since September. She denies any suicidal or homicidal ideations. She does endorse occasional passive death wish. She is taking trazodone 25-50 mg at night for sleep.  #2. Left foot pain. Pain left midfoot which did present for about 7 years. However this acutely worsen the past one to 2 months. Worse when prolonged standing or direct palpation along third and fourth metatarsal. No fevers or chills. No recent injuries. Pain began 7 years ago when she tripped and had stress fracture in that foot.  Review of Systems See HPI above for review of systems.       Objective:   Physical Exam  Gen:  Alert, cooperative patient who appears stated age in no acute distress.  Vital signs reviewed. Psych:  Tearful. Depressed appearing. She does smile spontaneously me several times during interview. Extremities: Right foot within normal limits. Left foot to palpation along third and fourth metatarsal distally along the dorsal aspect of her foot. No pain fifth metatarsal. Able to fully flex and extend her toes without pain. No bruising noted.     Assessment & Plan:

## 2012-07-25 NOTE — Assessment & Plan Note (Signed)
Patient desires radiographs of foot to rule out any further fracture.   Continue Tramadol as needed for analgesia moderate to severe pain, Tylenol for mild pain. Will FU next visit.

## 2012-07-30 ENCOUNTER — Ambulatory Visit
Admission: RE | Admit: 2012-07-30 | Discharge: 2012-07-30 | Disposition: A | Discharge status: Home / Self Care | Payer: Medicare Other | Source: Ambulatory Visit | Attending: Family Medicine | Admitting: Family Medicine

## 2012-07-30 DIAGNOSIS — M79672 Pain in left foot: Secondary | ICD-10-CM

## 2012-07-31 ENCOUNTER — Telehealth: Payer: Self-pay | Admitting: Family Medicine

## 2012-07-31 NOTE — Telephone Encounter (Signed)
Had to leave message to call clinic back.  Told her everything looked good on x-ray and she can call back to discuss with me.

## 2012-08-04 ENCOUNTER — Encounter: Payer: Self-pay | Admitting: Family Medicine

## 2012-08-04 ENCOUNTER — Ambulatory Visit (INDEPENDENT_AMBULATORY_CARE_PROVIDER_SITE_OTHER): Payer: Medicare Other | Admitting: Family Medicine

## 2012-08-04 VITALS — BP 118/76 | HR 74 | Ht 71.0 in | Wt 293.0 lb

## 2012-08-04 DIAGNOSIS — F329 Major depressive disorder, single episode, unspecified: Secondary | ICD-10-CM

## 2012-08-04 DIAGNOSIS — E119 Type 2 diabetes mellitus without complications: Secondary | ICD-10-CM

## 2012-08-04 DIAGNOSIS — M79672 Pain in left foot: Secondary | ICD-10-CM

## 2012-08-04 DIAGNOSIS — M79609 Pain in unspecified limb: Secondary | ICD-10-CM

## 2012-08-04 LAB — POCT GLYCOSYLATED HEMOGLOBIN (HGB A1C): Hemoglobin A1C: 6

## 2012-08-04 NOTE — Patient Instructions (Signed)
Go back to the 50 mg of Trazodone at night.    Give it 2-3 weeks with the volunteer work and Hospice.  If you are not doing as well even with these things, come back to see me.   Otherwise see me in about 4-6 weeks.  It was good to see you again today.

## 2012-08-04 NOTE — Progress Notes (Signed)
  Subjective:    Patient ID: Brianna Wheeler, female    DOB: 17-Jan-1956, 57 y.o.   MRN: [id number]  HPI  1.  FU for depression:  Too groggy on increased dose of medications.  Sleeping 13 hours a night or so.  Son has trouble waking her.  She has started seeing Hospice, seeing them 1 x weekly.  Has also started volunteering with [employer] ([employer]).  Feels these things have greatly boosted her mood.  Would like to decrease Trazodone and attempt trial off medications.  No SI/HI.    2.   Diabetes:  Has been off of Metformin for past several months.  Trying to control diabetes with diet and exercise.  No hypoglycemic events.  No paresthesia or peripheral nerve pain.  Measures blood sugars at home every: week or so and working about 130s Lab Results  Component Value Date   HGBA1C 5.8 04/17/2012     Review of Systems See HPI above for review of systems.       Objective:   Physical Exam BP 118/76  Pulse 74  Ht 5\' 11"  (1.803 m)  Wt 293 lb (132.904 kg)  BMI 40.87 kg/m2 Gen: Well NAD HEENT: EOMI,  MMM Lungs: CTABL Nl WOB Heart: RRR no MRG Psych:  Much improved.  Smiling, conversant, linear and coherent thought process.  Not depressed or anxious appearing.          Assessment & Plan:

## 2012-08-04 NOTE — Assessment & Plan Note (Signed)
Doing much better today. Decrease to 50 mg Trazodone for sleep. Think that counseling, which recommended she be seen 1:1 rather than with son present, is greatly helping her.  She agrees.   Think that community activities are also helping to get her out of the house and help boost her mood.   FU in 2-3 weeks to re-assess for medication needs.  She would like trial off meds. If she's doing well, will see back in 4-6 weeks.

## 2012-08-04 NOTE — Assessment & Plan Note (Signed)
Much improved.  Using heating pad to help with relief.   Notes that pain is worse with cold weather.

## 2012-08-04 NOTE — Assessment & Plan Note (Signed)
Checking A1C today. Will re-eval whether she needs to go back on Metformin. She is attempting to schedule eye exam.

## 2012-08-07 ENCOUNTER — Telehealth: Payer: Self-pay | Admitting: Family Medicine

## 2012-08-07 NOTE — Telephone Encounter (Signed)
Patient is calling for the results of her A1C.  She was not able to access it through My Chart.

## 2012-08-08 NOTE — Telephone Encounter (Signed)
Called and lmovm informing pt that her A1C was 6.0.Brianna Wheeler [place]

## 2012-08-25 ENCOUNTER — Other Ambulatory Visit: Payer: Self-pay | Admitting: Family Medicine

## 2012-09-17 ENCOUNTER — Other Ambulatory Visit: Payer: Self-pay | Admitting: Family Medicine

## 2012-09-18 NOTE — Telephone Encounter (Signed)
Patient is calling to ask Dr. Gwendolyn Grant to please refill her Tramadol today because she is in a lot of pain.

## 2012-10-09 ENCOUNTER — Other Ambulatory Visit: Payer: Self-pay

## 2012-10-22 ENCOUNTER — Other Ambulatory Visit: Payer: Self-pay | Admitting: Family Medicine

## 2012-10-30 ENCOUNTER — Ambulatory Visit
Admission: RE | Admit: 2012-10-30 | Discharge: 2012-10-30 | Disposition: A | Discharge status: Home / Self Care | Payer: Medicare Other | Source: Ambulatory Visit

## 2012-10-30 DIAGNOSIS — Z1231 Encounter for screening mammogram for malignant neoplasm of breast: Secondary | ICD-10-CM

## 2012-11-01 ENCOUNTER — Other Ambulatory Visit: Payer: Self-pay | Admitting: Family Medicine

## 2012-11-03 ENCOUNTER — Telehealth: Payer: Self-pay | Admitting: Family Medicine

## 2012-11-03 NOTE — Telephone Encounter (Signed)
Pt has been stung by bee and used EPI pen but her hand is still shaking

## 2012-11-03 NOTE — Telephone Encounter (Signed)
Patient stung by bee on side of right middle finger.  Swollen on side of finger.   Used Epipen and "feels better."  Patient c/o feeling "shaky" because she was alone and scared.  Denies any shortness of breath, hives, or tingling in throat.  Patient thinks she has refills of Epipen--will check with her pharmacy and have them call us if she needs refills.  Patient will take some Benadryl and call back as needed.  Gaylene Brooks, RN

## 2012-11-19 ENCOUNTER — Other Ambulatory Visit: Payer: Self-pay | Admitting: Family Medicine

## 2012-11-27 ENCOUNTER — Other Ambulatory Visit: Payer: Self-pay | Admitting: Family Medicine

## 2012-12-09 ENCOUNTER — Ambulatory Visit (INDEPENDENT_AMBULATORY_CARE_PROVIDER_SITE_OTHER): Payer: Medicare Other | Admitting: Family Medicine

## 2012-12-09 ENCOUNTER — Encounter: Payer: Self-pay | Admitting: Family Medicine

## 2012-12-09 VITALS — BP 130/83 | HR 80 | Temp 98.6°F | Ht 71.0 in | Wt 294.0 lb

## 2012-12-09 DIAGNOSIS — M549 Dorsalgia, unspecified: Secondary | ICD-10-CM

## 2012-12-09 MED ORDER — TRAMADOL HCL 50 MG PO TABS: ORAL_TABLET | ORAL | Status: DC

## 2012-12-09 MED ORDER — OMEPRAZOLE 20 MG PO CPDR: 20.0000 mg | DELAYED_RELEASE_CAPSULE | Freq: Every day | ORAL | Status: DC

## 2012-12-09 MED ORDER — SIMVASTATIN 40 MG PO TABS: 40.0000 mg | ORAL_TABLET | Freq: Every day | ORAL | Status: DC

## 2012-12-09 NOTE — Progress Notes (Signed)
Subjective:     Patient ID: Brianna Wheeler, female   DOB: 1956/04/02, 57 y.o.   MRN: [id number]  Back Pain This is a chronic (Back pain worsen yesterday after moving furnitures) problem. The problem occurs constantly. The problem has been waxing and waning since onset. The pain is present in the lumbar spine. The pain does not radiate. The pain is at a severity of 8/10. The pain is the same all the time. The symptoms are aggravated by bending and position. Pertinent negatives include no bladder incontinence, bowel incontinence, paresthesias, tingling or weakness. Treatments tried: Tramadol and OTC ointment. The treatment provided moderate relief.  She tried PT in the past for her chronic back pain which did not helped,she prefer home exercise.  Current Outpatient Prescriptions on File Prior to Visit  Medication Sig Dispense Refill  . albuterol (PROAIR HFA) 108 (90 BASE) MCG/ACT inhaler Inhale 2 puffs into the lungs every 4 (four) hours as needed.  8.5 g  1  . aspirin (ASPIR-81) 81 MG EC tablet Take 81 mg by mouth daily.        . cyclobenzaprine (FLEXERIL) 10 MG tablet Take 1 tablet (10 mg total) by mouth 2 (two) times daily as needed.  30 tablet  1  . EPINEPHrine (EPI-PEN) 0.3 mg/0.3 mL DEVI Inject 0.3 mLs (0.3 mg total) into the muscle once.  2 Device  0  . fexofenadine (ALLEGRA) 180 MG tablet Take 180 mg by mouth daily.        . fluticasone (FLONASE) 50 MCG/ACT nasal spray Place 1 spray into the nose 2 (two) times daily. 1 spray in both nostrils  16 g  2  . hydrochlorothiazide (MICROZIDE) 12.5 MG capsule TAKE TWO CAPSULES BY MOUTH IN THE MORNING  120 capsule  3  . levothyroxine (SYNTHROID, LEVOTHROID) 112 MCG tablet Take 1 tablet (112 mcg total) by mouth daily.  90 tablet  3  . glucose blood (BAYER CONTOUR TEST) test strip 1 each by Other route as needed. Use as instructed       . HYDROcodone-acetaminophen (NORCO) 5-325 MG per tablet Take 1 tablet by mouth every 8 (eight) hours as needed for  pain.  15 tablet  0  . Lancet Devices (AUTOLET LANCING DEVICE) MISC by Does not apply route as directed. Check blood glucose levels 1-3 times a day       . traZODone (DESYREL) 150 MG tablet Take 1 tablet (150 mg total) by mouth at bedtime.  30 tablet  0   Current Facility-Administered Medications on File Prior to Visit  Medication Dose Route Frequency Provider Last Rate Last Dose  . pneumococcal 23 valent vaccine (PNU-IMMUNE) injection 0.5 mL  0.5 mL Intramuscular Once Tobey Grim, MD       Past Medical History  Diagnosis Date  . Hypertension   . Hyperlipidemia   . Diabetes mellitus      Review of Systems  Respiratory: Negative.   Cardiovascular: Negative.   Gastrointestinal: Negative.  Negative for bowel incontinence.  Genitourinary: Negative.  Negative for bladder incontinence.  Musculoskeletal: Positive for back pain. Negative for gait problem.  Neurological: Negative for tingling, weakness and paresthesias.  All other systems reviewed and are negative.   Filed Vitals:   12/09/12 1057  BP: 130/83  Pulse: 80  Temp: 98.6 F (37 C)  TempSrc: Oral  Height: 5\' 11"  (1.803 m)  Weight: 294 lb (133.358 kg)       Objective:   Physical Exam  Nursing note and vitals  reviewed. Constitutional: She is oriented to person, place, and time. She appears well-developed. No distress.  Cardiovascular: Normal rate, regular rhythm and normal heart sounds.   No murmur heard. Pulmonary/Chest: Effort normal and breath sounds normal. No respiratory distress. She has no wheezes.  Abdominal: Soft. Bowel sounds are normal. She exhibits no distension. There is no tenderness.  Musculoskeletal: She exhibits no edema.       Lumbar back: She exhibits decreased range of motion and tenderness.  Neurological: She is alert and oriented to person, place, and time.       Assessment:     Back pain: Chronic likely arthritis. Med refill.                      Plan:     1. Pain had been  controlled on Tramadol prn till she moved furniture.  I refilled her tramadol prn pain. Bengay ointment recommended as well. I offered PT referral but she declined. Instruction on home exercise given. RTC in 1-2 wks if no improvement,she will need imaging then.  2. Requested refill of Omeprazole,Tramadol and Zocor,I gave refill today.

## 2012-12-09 NOTE — Assessment & Plan Note (Signed)
Back pain: Chronic likely arthritis. Pain had been controlled on Tramadol prn till she moved furniture.  I refilled her tramadol prn pain. Bengay ointment recommended as well. I offered PT referral but she declined. Instruction on home exercise given. RTC in 1-2 wks if no improvement,she will need imaging then.

## 2012-12-09 NOTE — Patient Instructions (Signed)
Back Exercises Back exercises help treat and prevent back injuries. The goal is to increase your strength in your belly (abdominal) and back muscles. These exercises can also help with flexibility. Start these exercises when told by your doctor. HOME CARE Back exercises include: Pelvic Tilt.  Lie on your back with your knees bent. Tilt your pelvis until the lower part of your back is against the floor. Hold this position 5 to 10 sec. Repeat this exercise 5 to 10 times. Knee to Chest.  Pull 1 knee up against your chest and hold for 20 to 30 seconds. Repeat this with the other knee. This may be done with the other leg straight or bent, whichever feels better. Then, pull both knees up against your chest. Sit-Ups or Curl-Ups.  Bend your knees 90 degrees. Start with tilting your pelvis, and do a partial, slow sit-up. Only lift your upper half 30 to 45 degrees off the floor. Take at least 2 to 3 seonds for each sit-up. Do not do sit-ups with your knees out straight. If partial sit-ups are difficult, simply do the above but with only tightening your belly (abdominal) muscles and holding it as told. Hip-Lift.  Lie on your back with your knees flexed 90 degrees. Push down with your feet and shoulders as you raise your hips 2 inches off the floor. Hold for 10 seconds, repeat 5 to 10 times. Back Arches.  Lie on your stomach. Prop yourself up on bent elbows. Slowly press on your hands, causing an arch in your low back. Repeat 3 to 5 times. Shoulder-Lifts.  Lie face down with arms beside your body. Keep hips and belly pressed to floor as you slowly lift your head and shoulders off the floor. Do not overdo your exercises. Be careful in the beginning. Exercises may cause you some mild back discomfort. If the pain lasts for more than 15 minutes, stop the exercises until you see your doctor. Improvement with exercise for back problems is slow.  Document Released: 08/11/2010 Document Revised: 10/01/2011  Document Reviewed: 05/10/2011 [employer] Patient Information 2013 [employer].  

## 2012-12-23 ENCOUNTER — Encounter: Payer: Self-pay | Admitting: Family Medicine

## 2012-12-23 ENCOUNTER — Ambulatory Visit (INDEPENDENT_AMBULATORY_CARE_PROVIDER_SITE_OTHER): Payer: Medicare Other | Admitting: Family Medicine

## 2012-12-23 VITALS — BP 126/79 | HR 74 | Temp 98.6°F | Ht 71.0 in | Wt 289.0 lb

## 2012-12-23 DIAGNOSIS — E119 Type 2 diabetes mellitus without complications: Secondary | ICD-10-CM

## 2012-12-23 DIAGNOSIS — M549 Dorsalgia, unspecified: Secondary | ICD-10-CM

## 2012-12-23 NOTE — Assessment & Plan Note (Signed)
At goal currently.   Going for retinal scan today.

## 2012-12-23 NOTE — Progress Notes (Signed)
Subjective:    Brianna Wheeler is a 57 y.o. female who presents to [facility] today for FU for back pain:  1.  Back pain:  Seen here 5/20 for back pain after moving things at home and having increased stress.  States that family members state she is "working too hard."  She works in her garden, walks for exercise in her backyard.  Has continued walking for exercise.  Has not had pain for several days.    No further back pain.  Still taking Tramadol, she takes this daily even before the back pain started, chronic med for her. No longer needs muscle relaxer or heating pad.  No bladder/bowel incontinence.   2. Diabetes:  Controlled with diet and exercise.  No adverse effects from medication.  No low blood sugars.  No paresthesia or peripheral nerve pain.  Measures blood sugars at home every:    Doesn't measure at home.  Lab Results  Component Value Date   HGBA1C 6.0 12/23/2012      The following portions of the patient's history were reviewed and updated as appropriate: allergies, current medications, past medical history, family and social history, and problem list. Patient is a nonsmoker.    PMH reviewed.  Past Medical History  Diagnosis Date  . Hypertension   . Hyperlipidemia   . Diabetes mellitus    Past Surgical History  Procedure Laterality Date  . Tubal ligation      Medications reviewed. Current Outpatient Prescriptions  Medication Sig Dispense Refill  . albuterol (PROAIR HFA) 108 (90 BASE) MCG/ACT inhaler Inhale 2 puffs into the lungs every 4 (four) hours as needed.  8.5 g  1  . aspirin (ASPIR-81) 81 MG EC tablet Take 81 mg by mouth daily.        . cyclobenzaprine (FLEXERIL) 10 MG tablet Take 1 tablet (10 mg total) by mouth 2 (two) times daily as needed.  30 tablet  1  . EPINEPHrine (EPI-PEN) 0.3 mg/0.3 mL DEVI Inject 0.3 mLs (0.3 mg total) into the muscle once.  2 Device  0  . fexofenadine (ALLEGRA) 180 MG tablet Take 180 mg by mouth daily.        . fluticasone (FLONASE) 50  MCG/ACT nasal spray Place 1 spray into the nose 2 (two) times daily. 1 spray in both nostrils  16 g  2  . glucose blood (BAYER CONTOUR TEST) test strip 1 each by Other route as needed. Use as instructed       . hydrochlorothiazide (MICROZIDE) 12.5 MG capsule TAKE TWO CAPSULES BY MOUTH IN THE MORNING  120 capsule  3  . HYDROcodone-acetaminophen (NORCO) 5-325 MG per tablet Take 1 tablet by mouth every 8 (eight) hours as needed for pain.  15 tablet  0  . Lancet Devices (AUTOLET LANCING DEVICE) MISC by Does not apply route as directed. Check blood glucose levels 1-3 times a day       . levothyroxine (SYNTHROID, LEVOTHROID) 112 MCG tablet Take 1 tablet (112 mcg total) by mouth daily.  90 tablet  3  . omeprazole (PRILOSEC) 20 MG capsule Take 1 capsule (20 mg total) by mouth daily.  30 capsule  0  . simvastatin (ZOCOR) 40 MG tablet Take 1 tablet (40 mg total) by mouth at bedtime.  90 tablet  0  . traMADol (ULTRAM) 50 MG tablet 1-2 tablet PO BID prn pain.  60 tablet  0  . traZODone (DESYREL) 150 MG tablet Take 1 tablet (150 mg total) by mouth at  bedtime.  30 tablet  0   Current Facility-Administered Medications  Medication Dose Route Frequency Provider Last Rate Last Dose  . pneumococcal 23 valent vaccine (PNU-IMMUNE) injection 0.5 mL  0.5 mL Intramuscular Once Tobey Grim, MD        ROS as above otherwise neg.  No chest pain, palpitations, SOB, Fever, Chills, Abd pain, N/V/D.   Objective:   Physical Exam BP 126/79  Pulse 74  Temp(Src) 98.6 F (37 C) (Oral)  Ht 5\' 11"  (1.803 m)  Wt 289 lb (131.09 kg)  BMI 40.33 kg/m2 Gen:  Alert, cooperative patient who appears stated age in no acute distress.  Vital signs reviewed.  Back - Normal skin, Spine with normal alignment and no deformity.  No tenderness to vertebral process palpation.  Paraspinous muscles are not tender and without spasm.   Range of motion is full at neck and lumbar sacral regions   Results for orders placed in visit on 12/23/12  (from the past 72 hour(s))  POCT GLYCOSYLATED HEMOGLOBIN (HGB A1C)     Status: None   Collection Time    12/23/12 10:56 AM      Result Value Range   Hemoglobin A1C 6.0

## 2012-12-23 NOTE — Assessment & Plan Note (Signed)
-   Resolved.   - F/U PRN.

## 2012-12-23 NOTE — Addendum Note (Signed)
Addended by: Jennette Bill on: 12/23/2012 12:19 PM   Modules accepted: Orders

## 2013-01-01 ENCOUNTER — Ambulatory Visit (INDEPENDENT_AMBULATORY_CARE_PROVIDER_SITE_OTHER): Payer: Medicare Other | Admitting: Family Medicine

## 2013-01-01 ENCOUNTER — Encounter: Payer: Self-pay | Admitting: Family Medicine

## 2013-01-01 VITALS — BP 113/72 | HR 72 | Temp 99.5°F | Ht 71.0 in | Wt 296.0 lb

## 2013-01-01 DIAGNOSIS — H269 Unspecified cataract: Secondary | ICD-10-CM

## 2013-01-01 DIAGNOSIS — H6990 Unspecified Eustachian tube disorder, unspecified ear: Secondary | ICD-10-CM

## 2013-01-01 DIAGNOSIS — H6993 Unspecified Eustachian tube disorder, bilateral: Secondary | ICD-10-CM

## 2013-01-01 MED ORDER — PREDNISONE 20 MG PO TABS: ORAL_TABLET | ORAL | Status: DC

## 2013-01-01 MED ORDER — FLUTICASONE PROPIONATE 50 MCG/ACT NA SUSP: NASAL | Status: DC

## 2013-01-01 NOTE — Assessment & Plan Note (Signed)
Discussed. Are sure to followup with her ophthalmologist.

## 2013-01-01 NOTE — Progress Notes (Signed)
  Subjective:    Patient ID: Brianna Wheeler, female    DOB: 07-Jan-1956, 57 y.o.   MRN: [id number]  HPI 23 days of left greater than right with bilateral ear pain. Has also had some dizziness when she moves her head quickly to the right. Has never had this before. Does have chronic allergies but is on Allegra at home. Has not been taking her nasal spray. Has had no fever, sweats, chills. No unusual nasal discharge. No cough.   Review of Systems See history of present illness    Objective:   Physical Exam  Vital signs are reviewed General: Well-developed female no acute distress HEENT: TMs bilaterally are quite retracted. The left is nonmobile. Left eye is opacified secondary to cataract. Oropharynx is clear without exudate. Neck no lymphadenopathy. Full range of motion. Supple.      Assessment & Plan:  Eustachian tube dysfunction. We'll put her back on fluticasone nasal spray actually increase her dose to 2 sprays each side once a day for 6 weeks. Given her dizziness with a couple of episodes horrible it for 3-5 days of steroid low dose, continue her Allegra. Followup if not improving.

## 2013-01-12 ENCOUNTER — Other Ambulatory Visit: Payer: Self-pay | Admitting: Family Medicine

## 2013-01-12 ENCOUNTER — Telehealth: Payer: Self-pay | Admitting: Family Medicine

## 2013-01-12 MED ORDER — TRAMADOL HCL 50 MG PO TABS: ORAL_TABLET | ORAL | Status: DC

## 2013-01-12 NOTE — Telephone Encounter (Signed)
Patient calling about chronic back pain. She is wondering if tramadol prescription called in. I checked and noted that Dr. Gwendolyn Grant has already sent this and should be available in the morning. She is fine with this and will use tylenol and heating pads tonight.

## 2013-01-12 NOTE — Telephone Encounter (Signed)
Patient would like a refill TraMadol 50 MG. Patient thought that Dr. Gwendolyn Grant would make sure that all her medications where up to date. So she is hoping that someone on Dr. Tyson Alias team would call this in for her.

## 2013-01-12 NOTE — Telephone Encounter (Signed)
Will fwd to PCP for review.  I can call in if approved.  Benny Henrie, Darlyne Russian, CMA

## 2013-01-12 NOTE — Telephone Encounter (Signed)
Sent in electronically .  

## 2013-01-13 ENCOUNTER — Other Ambulatory Visit: Payer: Self-pay | Admitting: *Deleted

## 2013-01-13 NOTE — Telephone Encounter (Signed)
LMoVM that medication sent in.  Sevag Shearn, Darlyne Russian, CMA

## 2013-01-13 NOTE — Telephone Encounter (Signed)
Tramadol prescription called in to [facility] - they did not receive per pharmacy that Dr. Gwendolyn Grant called in yesterday. NO further concerns. Wyatt Haste, RN-BSN

## 2013-01-29 ENCOUNTER — Other Ambulatory Visit: Payer: Self-pay

## 2013-01-30 ENCOUNTER — Telehealth: Payer: Self-pay | Admitting: Family Medicine

## 2013-01-30 NOTE — Telephone Encounter (Signed)
Patient is wanting to talk to someone about about what eye doctor is a good one to go to.

## 2013-01-30 NOTE — Telephone Encounter (Signed)
Patient was informed that we ref to several eye doctors and that they were all very good,she already spoke with her insurance and found several,she was very appreciative of the rtn call.me

## 2013-02-11 ENCOUNTER — Encounter: Payer: Self-pay | Admitting: Home Health Services

## 2013-02-19 ENCOUNTER — Other Ambulatory Visit: Payer: Self-pay | Admitting: Family Medicine

## 2013-02-20 ENCOUNTER — Telehealth: Payer: Self-pay | Admitting: Family Medicine

## 2013-02-20 MED ORDER — TRAMADOL HCL 50 MG PO TABS: 50.0000 mg | ORAL_TABLET | Freq: Two times a day (BID) | ORAL | Status: DC | PRN

## 2013-02-20 NOTE — Telephone Encounter (Signed)
Tramadol refill called in. Please inform patient.

## 2013-02-20 NOTE — Telephone Encounter (Signed)
Mrs. Brianna Wheeler received a letter from you about the Wellness visit.  She had already made an appt with Dr. Gwendolyn Grant for her physical.  Would you please contact her back to discuss about the Wellness visit.

## 2013-02-23 ENCOUNTER — Telehealth: Payer: Self-pay | Admitting: Family Medicine

## 2013-02-23 NOTE — Telephone Encounter (Signed)
Pt is checking on the status of the refill request that [facility] sent over JW

## 2013-02-23 NOTE — Telephone Encounter (Signed)
After hours call:  On Tramadol for back pain. Patient states her last prescription that was written for one pill daily and she has since ran out of her medications. Advised Brianna Wheeler that unfortunately i will not be able to send in a new prescription for her, but it appears Dr. Armen Pickup called in #[phone] on Aug 1.  She will contact [facility] to see if they have that on file, otherwise she will come to clinic tomorrow.  Brianna Wheeler M. Brianna Wheeler, M.D.

## 2013-02-24 ENCOUNTER — Telehealth: Payer: Self-pay | Admitting: Family Medicine

## 2013-02-24 NOTE — Telephone Encounter (Signed)
Pt need help with her prescription sent to [facility] on 8/1. She can not pick this up because [facility] is said their something wrong with the way it is written. They would not tell what the problem is. She is wanting Dr. Santo Held to correct this issue so she can pick up her medication. JW

## 2013-02-24 NOTE — Telephone Encounter (Signed)
Rx for Tramadol called in as one a day.  According to OV notes and medication list, sig is one to tow tablet twice a day as needed for pain.  Called [facility] and corrected sig.  Laretta Pyatt, Darlyne Russian, CMA

## 2013-03-03 NOTE — Telephone Encounter (Signed)
Spoke with patient.  She is fine with physical with Gwendolyn Grant on 8/19 will decide if AWV is needed after that appointment.

## 2013-03-10 ENCOUNTER — Telehealth: Payer: Self-pay | Admitting: Family Medicine

## 2013-03-10 ENCOUNTER — Ambulatory Visit (INDEPENDENT_AMBULATORY_CARE_PROVIDER_SITE_OTHER): Payer: Medicare Other | Admitting: Family Medicine

## 2013-03-10 ENCOUNTER — Encounter: Payer: Self-pay | Admitting: Family Medicine

## 2013-03-10 VITALS — BP 135/77 | HR 78 | Ht 71.0 in | Wt 302.0 lb

## 2013-03-10 DIAGNOSIS — E785 Hyperlipidemia, unspecified: Secondary | ICD-10-CM

## 2013-03-10 DIAGNOSIS — E039 Hypothyroidism, unspecified: Secondary | ICD-10-CM

## 2013-03-10 DIAGNOSIS — E119 Type 2 diabetes mellitus without complications: Secondary | ICD-10-CM

## 2013-03-10 DIAGNOSIS — F329 Major depressive disorder, single episode, unspecified: Secondary | ICD-10-CM

## 2013-03-10 DIAGNOSIS — I10 Essential (primary) hypertension: Secondary | ICD-10-CM

## 2013-03-10 LAB — COMPREHENSIVE METABOLIC PANEL
ALT: 34 U/L (ref 0–35)
AST: 51 U/L — ABNORMAL HIGH (ref 0–37)
Calcium: 9.6 mg/dL (ref 8.4–10.5)
Chloride: 97 mEq/L (ref 96–112)
Creat: 1.07 mg/dL (ref 0.50–1.10)
Potassium: 3.3 mEq/L — ABNORMAL LOW (ref 3.5–5.3)
Sodium: 133 mEq/L — ABNORMAL LOW (ref 135–145)

## 2013-03-10 LAB — CBC
HCT: 38.7 % (ref 36.0–46.0)
MCHC: 33.1 g/dL (ref 30.0–36.0)
MCV: 79.8 fL (ref 78.0–100.0)
Platelets: 253 10*3/uL (ref 150–400)
RDW: 16.1 % — ABNORMAL HIGH (ref 11.5–15.5)
WBC: 4.4 10*3/uL (ref 4.0–10.5)

## 2013-03-10 MED ORDER — ALBUTEROL SULFATE HFA 108 (90 BASE) MCG/ACT IN AERS: 2.0000 | INHALATION_SPRAY | RESPIRATORY_TRACT | Status: DC | PRN

## 2013-03-10 MED ORDER — HYDROCHLOROTHIAZIDE 25 MG PO TABS: 25.0000 mg | ORAL_TABLET | Freq: Every day | ORAL | Status: DC

## 2013-03-10 MED ORDER — SIMVASTATIN 40 MG PO TABS: 40.0000 mg | ORAL_TABLET | Freq: Every day | ORAL | Status: DC

## 2013-03-10 MED ORDER — OMEPRAZOLE 20 MG PO CPDR: 20.0000 mg | DELAYED_RELEASE_CAPSULE | Freq: Every day | ORAL | Status: DC

## 2013-03-10 MED ORDER — TRAZODONE HCL 150 MG PO TABS: 150.0000 mg | ORAL_TABLET | Freq: Every day | ORAL | Status: DC

## 2013-03-10 NOTE — Assessment & Plan Note (Signed)
A1c is steadily crept up.  Outside work showed 6.3 And we'll restart metformin today.

## 2013-03-10 NOTE — Telephone Encounter (Signed)
Pt was seen today and Dr. Gwendolyn Grant called in all her prescriptions except the metformin. He told her he wanted her to start taking it again.JW

## 2013-03-10 NOTE — Progress Notes (Signed)
Patient ID: Brianna Wheeler, female   DOB: [birth year], 57 y.o.   MRN: [id number] Subjective:    Brianna Wheeler is a 57 y.o. female who presents to [facility] today for several issues:  1.  Weight gain:  Patient has noted that she has steadily been gaining weight past several months. She states this worsened after the husband last fall. She states this makes her depressed. She was recommended control says she can stay healthy for her son. Has not had TSH checked recently. Does continue to take her thyroid medicine. Please see below.  2.  Diabetes:  Currently just controlled with food and exercise.    Previously on metformin without any adverse effects. Denies any symptoms of hypoglycemic events.  No paresthesia or peripheral nerve pain.  Measures blood sugars at home every: Does not measure at home. She brings in some paperwork today where home health nurse checked her blood sugar and her A1c was 6.3.    Lab Results  Component Value Date   HGBA1C 6.0 12/23/2012   3.  Hypothyroidism:   Brianna Wheeler is a 57 y.o. female who presents for follow up of hypothyroidism. Current symptoms: has had some increased fatigue for past several months.  as well as heat and cold intolerance and gradual weight gain. Patient denies diarrhea, nervousness and palpitations. Symptoms have gradually gotten worse in past several days.    4.  LDL:  Outside work showed sodium to be 109. This is at goal for her. She is already on 40 mg of simvastatin.  The following portions of the patient's history were reviewed and updated as appropriate: allergies, current medications, past medical history, family and social history, and problem list. Patient is a nonsmoker.    PMH reviewed.  Past Medical History  Diagnosis Date  . Hypertension   . Hyperlipidemia   . Diabetes mellitus    Past Surgical History  Procedure Laterality Date  . Tubal ligation      Medications reviewed. Current Outpatient Prescriptions  Medication Sig  Dispense Refill  . albuterol (PROAIR HFA) 108 (90 BASE) MCG/ACT inhaler Inhale 2 puffs into the lungs every 4 (four) hours as needed.  8.5 g  1  . aspirin (ASPIR-81) 81 MG EC tablet Take 81 mg by mouth daily.        . cyclobenzaprine (FLEXERIL) 10 MG tablet Take 1 tablet (10 mg total) by mouth 2 (two) times daily as needed.  30 tablet  1  . EPINEPHrine (EPI-PEN) 0.3 mg/0.3 mL DEVI Inject 0.3 mLs (0.3 mg total) into the muscle once.  2 Device  0  . fexofenadine (ALLEGRA) 180 MG tablet Take 180 mg by mouth daily.        . fluticasone (FLONASE) 50 MCG/ACT nasal spray Place 2 sprays in each nostril daily  32 g  12  . glucose blood (BAYER CONTOUR TEST) test strip 1 each by Other route as needed. Use as instructed       . hydrochlorothiazide (MICROZIDE) 12.5 MG capsule TAKE TWO CAPSULES BY MOUTH IN THE MORNING  120 capsule  3  . HYDROcodone-acetaminophen (NORCO) 5-325 MG per tablet Take 1 tablet by mouth every 8 (eight) hours as needed for pain.  15 tablet  0  . Lancet Devices (AUTOLET LANCING DEVICE) MISC by Does not apply route as directed. Check blood glucose levels 1-3 times a day       . levothyroxine (SYNTHROID, LEVOTHROID) 112 MCG tablet Take 1 tablet (112 mcg total) by mouth  daily.  90 tablet  3  . omeprazole (PRILOSEC) 20 MG capsule Take 1 capsule (20 mg total) by mouth daily.  30 capsule  0  . predniSONE (DELTASONE) 20 MG tablet Take one tab by mouth daily for 3-5 days for ear congestion  5 tablet  1  . simvastatin (ZOCOR) 40 MG tablet Take 1 tablet (40 mg total) by mouth at bedtime.  90 tablet  0  . traMADol (ULTRAM) 50 MG tablet Take 1 tablet (50 mg total) by mouth 2 (two) times daily as needed for pain.  60 tablet  0  . traZODone (DESYREL) 150 MG tablet Take 1 tablet (150 mg total) by mouth at bedtime.  30 tablet  0   Current Facility-Administered Medications  Medication Dose Route Frequency Provider Last Rate Last Dose  . pneumococcal 23 valent vaccine (PNU-IMMUNE) injection 0.5 mL  0.5  mL Intramuscular Once Tobey Grim, MD        ROS as above otherwise neg.  No chest pain, palpitations, SOB, Fever, Chills, Abd pain, N/V/D.   Objective:   Physical Exam BP 135/77  Pulse 78  Ht 5\' 11"  (1.803 m)  Wt 302 lb (136.986 kg)  BMI 42.14 kg/m2 Gen:  Alert, cooperative patient who appears stated age in no acute distress.  Vital signs reviewed. HEENT: EOMI,  MMM.  Left-sided exotropia noted. Chronic cataract is at baseline the left side. Neck: No thyromegaly noted. Cardiac:  Regular rate and rhythm without murmur auscultated.  Good S1/S2. Pulm:  Clear to auscultation bilaterally with good air movement.  No wheezes or rales noted.   Abd:  Obese. Soft and nondistended. Nontender.  Exts: No edema bilateral  No results found for this or any previous visit (from the past 72 hour(s)).

## 2013-03-10 NOTE — Assessment & Plan Note (Signed)
LDL from outside paperwork was 109. Continue Simvastatin.

## 2013-03-10 NOTE — Assessment & Plan Note (Signed)
Checking TSH today. Await results before sending in refills for synthroid.

## 2013-03-10 NOTE — Assessment & Plan Note (Signed)
AFfected by weight gain. Still with some grief over loss of her husband. Discussion about weight today. See obesity.

## 2013-03-10 NOTE — Patient Instructions (Addendum)
Give Brianna Wheeler a call for your yearly Medicare visit.    Go ahead and restart the Metformin.  And think that will help with both the A1C and the weight gain.    Make sure that half of your plate is vegetables.   Also eat breakfast in the morning.     Checking labs today.    It was good to see you again today!

## 2013-03-11 ENCOUNTER — Other Ambulatory Visit: Payer: Self-pay | Admitting: Family Medicine

## 2013-03-11 ENCOUNTER — Telehealth: Payer: Self-pay | Admitting: Family Medicine

## 2013-03-11 MED ORDER — POTASSIUM CHLORIDE CRYS ER 20 MEQ PO TBCR: 20.0000 meq | EXTENDED_RELEASE_TABLET | Freq: Every day | ORAL | Status: DC

## 2013-03-11 MED ORDER — LEVOTHYROXINE SODIUM 112 MCG PO TABS: 112.0000 ug | ORAL_TABLET | Freq: Every day | ORAL | Status: DC

## 2013-03-11 MED ORDER — METFORMIN HCL 850 MG PO TABS: 850.0000 mg | ORAL_TABLET | Freq: Two times a day (BID) | ORAL | Status: DC

## 2013-03-11 NOTE — Telephone Encounter (Signed)
Pt called because she forgot to get  Refill on Tramadol yesterday when she seen Dr. Gwendolyn Grant and she would like this called in since [facility] told her there were no more refill left. JW

## 2013-03-11 NOTE — Telephone Encounter (Signed)
LVM informing patient of below.

## 2013-03-11 NOTE — Telephone Encounter (Signed)
Pt called because [facility] is changing the company that supplies her levothyroxine and they need your permission to do this. JW

## 2013-03-11 NOTE — Telephone Encounter (Signed)
Would you guys mind calling Brianna Wheeler and telling her that her potassium is a little low?  I have sent in a weeks worth.  Likely from not eating many vegetables.   Thanks! Trey Paula

## 2013-03-11 NOTE — Telephone Encounter (Signed)
I did indeed tell her this and forgot to send in the medicine.  Sent now.

## 2013-03-13 ENCOUNTER — Telehealth: Payer: Self-pay | Admitting: Family Medicine

## 2013-03-13 NOTE — Telephone Encounter (Signed)
Patient is having problems with her sugars being all over the place and she knows it is coming from the Metformin.  She wants to know what Dr. Gwendolyn Grant wants to do about getting better control over this.

## 2013-03-13 NOTE — Telephone Encounter (Signed)
We have just restarted the Metformin yesterday.  It will take several days for her blood sugars to regulate since restarting this medicine.  Let's let her get use to the Metformin and, if next week she's still having issues, she should come back.

## 2013-03-17 ENCOUNTER — Ambulatory Visit (INDEPENDENT_AMBULATORY_CARE_PROVIDER_SITE_OTHER): Payer: Medicare Other | Admitting: Home Health Services

## 2013-03-17 VITALS — BP 133/81 | HR 76 | Temp 98.4°F | Ht 71.0 in | Wt 301.0 lb

## 2013-03-17 DIAGNOSIS — Z0000 Encounter for general adult medical examination without abnormal findings: Secondary | ICD-10-CM

## 2013-03-18 ENCOUNTER — Encounter: Payer: Self-pay | Admitting: Home Health Services

## 2013-03-18 NOTE — Progress Notes (Signed)
Addendum: I have reviewed this visit and discussed with Suzanne Lineberry and agree with her documentation.  

## 2013-03-18 NOTE — Progress Notes (Signed)
Patient here for annual wellness visit, patient reports: Risk Factors/Conditions needing evaluation or treatment: Pt does not have any new risk factors that need evaluation.  Pt was weepy and still grieving loss of husband.  Pt reported that she sees a hospice case manager weekly for counseling.  Home Safety: Pt lives with handicap son.  Pt reports having smoke detectors and extinguisher.  Other Information: Corrective lens: Pt does not wear corrective lens and is currently under care of opthalmologist to monitor cataract in left eye.  Dentures: Pt does not have dentures. Memory: Pt denies memory problems. Patient's Mini Mental Score (recorded in doc. flowsheet): 29 Balance/Gait: Pt reports no falls in the past 12 months.  Pt reports no weakness in legs and some dizziness.  Pt denies any problems with hearing.    Annual Wellness Visit Requirements Recorded Today In  Medical, family, social history Past Medical, Family, Social History Section  Current providers Care team  Current medications Medications  Wt, BP, Ht, BMI Vital signs  Tobacco, alcohol, illicit drug use History  ADL Nurse Assessment  Depression Screening Nurse Assessment  Cognitive impairment Nurse Assessment  Mini Mental Status Document Flowsheet  Fall Risk Fall/Depression  Home Safety Progress Note  End of Life Planning (welcome visit) Social Documentation  Medicare preventative services Progress Note  Risk factors/conditions needing evaluation/treatment Progress Note  Personalized health advice Patient Instructions, goals, letter  Diet & Exercise Social Documentation  Emergency Contact Social Documentation  Seat Belts Social Documentation  Sun exposure/protection Social Documentation

## 2013-03-19 ENCOUNTER — Ambulatory Visit (INDEPENDENT_AMBULATORY_CARE_PROVIDER_SITE_OTHER): Payer: Medicare Other | Admitting: *Deleted

## 2013-03-19 ENCOUNTER — Encounter (INDEPENDENT_AMBULATORY_CARE_PROVIDER_SITE_OTHER): Payer: Medicare Other | Admitting: Ophthalmology

## 2013-03-19 DIAGNOSIS — H43819 Vitreous degeneration, unspecified eye: Secondary | ICD-10-CM

## 2013-03-19 DIAGNOSIS — Z23 Encounter for immunization: Secondary | ICD-10-CM

## 2013-03-19 DIAGNOSIS — H2510 Age-related nuclear cataract, unspecified eye: Secondary | ICD-10-CM

## 2013-03-19 DIAGNOSIS — H33009 Unspecified retinal detachment with retinal break, unspecified eye: Secondary | ICD-10-CM

## 2013-04-06 ENCOUNTER — Telehealth: Payer: Self-pay | Admitting: Family Medicine

## 2013-04-06 NOTE — Telephone Encounter (Signed)
Pt called and would like a recommendation on OTC of cold medicine that she can take until her appointment 9/16. JW

## 2013-04-07 ENCOUNTER — Encounter: Payer: Self-pay | Admitting: Family Medicine

## 2013-04-07 ENCOUNTER — Ambulatory Visit (INDEPENDENT_AMBULATORY_CARE_PROVIDER_SITE_OTHER): Payer: Medicare Other | Admitting: Family Medicine

## 2013-04-07 VITALS — BP 120/75 | HR 84 | Temp 98.7°F | Ht 71.0 in | Wt 300.0 lb

## 2013-04-07 DIAGNOSIS — J069 Acute upper respiratory infection, unspecified: Secondary | ICD-10-CM | POA: Insufficient documentation

## 2013-04-07 MED ORDER — ACETAMINOPHEN-CODEINE 120-12 MG/5ML PO SOLN: 5.0000 mL | Freq: Four times a day (QID) | ORAL | Status: DC | PRN

## 2013-04-07 MED ORDER — AMOXICILLIN 875 MG PO TABS: 875.0000 mg | ORAL_TABLET | Freq: Two times a day (BID) | ORAL | Status: DC

## 2013-04-07 NOTE — Telephone Encounter (Signed)
Can try Mucinex

## 2013-04-07 NOTE — Assessment & Plan Note (Signed)
Looks fairly miserable, suffering for a while. Plan to treat due to length of symptoms and no improvement.   Will prescribe Amox x 7 days. Tylenol/codeine for cough relief. Instructed patient to return in 1 week for checkup or sooner if worsening or no improvement.

## 2013-04-07 NOTE — Progress Notes (Signed)
Subjective:    Brianna Wheeler is a 56 y.o. female who presents to [facility] today for URI symptoms:  URI symptoms:  Present for past 10 days. Starting moving furniture, pictures, etc to change her carpet.  Vastly increased amount of dust Describes rhinorrhea, sinus congestion, mod cough worse at night.  Has tried Flonase and OTC cough syrup without relief.  Sick contacts are none. No nausea or vomiting.    Had fever to 101 2 days ago.  Took Tylenol several hours ago for headache.   The following portions of the patient's history were reviewed and updated as appropriate: allergies, current medications, past medical history, family and social history, and problem list. Patient is a nonsmoker.    PMH reviewed.  Past Medical History  Diagnosis Date  . Hypertension   . Hyperlipidemia   . Diabetes mellitus    Past Surgical History  Procedure Laterality Date  . Tubal ligation      Medications reviewed. Current Outpatient Prescriptions  Medication Sig Dispense Refill  . albuterol (PROAIR HFA) 108 (90 BASE) MCG/ACT inhaler Inhale 2 puffs into the lungs every 4 (four) hours as needed.  8.5 g  1  . aspirin (ASPIR-81) 81 MG EC tablet Take 81 mg by mouth daily.        . cyclobenzaprine (FLEXERIL) 10 MG tablet Take 1 tablet (10 mg total) by mouth 2 (two) times daily as needed.  30 tablet  1  . EPINEPHrine (EPI-PEN) 0.3 mg/0.3 mL DEVI Inject 0.3 mLs (0.3 mg total) into the muscle once.  2 Device  0  . fexofenadine (ALLEGRA) 180 MG tablet Take 180 mg by mouth daily.        . fluticasone (FLONASE) 50 MCG/ACT nasal spray Place 2 sprays in each nostril daily  32 g  12  . hydrochlorothiazide (HYDRODIURIL) 25 MG tablet Take 1 tablet (25 mg total) by mouth daily.  90 tablet  1  . levothyroxine (SYNTHROID, LEVOTHROID) 112 MCG tablet Take 1 tablet (112 mcg total) by mouth daily.  90 tablet  1  . metFORMIN (GLUCOPHAGE) 850 MG tablet Take 1 tablet (850 mg total) by mouth 2 (two) times daily with a meal.  90  tablet  1  . omeprazole (PRILOSEC) 20 MG capsule Take 1 capsule (20 mg total) by mouth daily.  30 capsule  3  . potassium chloride SA (K-DUR,KLOR-CON) 20 MEQ tablet Take 1 tablet (20 mEq total) by mouth daily. X 7 days  7 tablet  0  . simvastatin (ZOCOR) 40 MG tablet Take 1 tablet (40 mg total) by mouth at bedtime.  90 tablet  1  . traMADol (ULTRAM) 50 MG tablet Take 1 tablet (50 mg total) by mouth 2 (two) times daily as needed for pain.  60 tablet  0  . traZODone (DESYREL) 150 MG tablet Take 1 tablet (150 mg total) by mouth at bedtime.  30 tablet  3   No current facility-administered medications for this visit.    ROS as above otherwise neg.  No chest pain, palpitations, SOB, Fever, Chills, Abd pain, N/V/D.   Objective:   Physical Exam BP 120/75  Pulse 84  Temp(Src) 98.7 F (37.1 C) (Oral)  Ht 5\' 11"  (1.803 m)  Wt 300 lb (136.079 kg)  BMI 41.86 kg/m2  SpO2 98% Gen:  Patient sitting on exam table, appears stated age in no acute distress Head: Normocephalic atraumatic Eyes: EOMI, PERRL, sclera and conjunctiva non-erythematous Ears:  Canals clear bilaterally.  TMs pearly gray bilaterally  without erythema or bulging.   Nose:  Nasal turbinates grossly enlarged bilaterally. Some exudates noted. Tender to palpation of maxillary sinus  Mouth: Mucosa membranes moist. Tonsils +2, nonenlarged, non-erythematous. Neck: No cervical lymphadenopathy noted Heart:  RRR, no murmurs auscultated. Pulm:  Mild rhonchi BL bases, no wheezes or crackles.  Multiple episodes of coughing during examination.     No results found for this or any previous visit (from the past 72 hour(s)).

## 2013-04-07 NOTE — Patient Instructions (Signed)
Take the Amoxicillin for the next 7 days.  Use the codeine cough syrup every 6 - 8 hours but do NOT drive with this.  It will make you sleepy.

## 2013-04-07 NOTE — Telephone Encounter (Signed)
Left detailed message on voicemail and requesting a return call if still having concerns. Demeisha Geraghty, Virgel Bouquet

## 2013-04-13 ENCOUNTER — Telehealth: Payer: Self-pay | Admitting: Family Medicine

## 2013-04-13 MED ORDER — TRAMADOL HCL 50 MG PO TABS: 50.0000 mg | ORAL_TABLET | Freq: Two times a day (BID) | ORAL | Status: DC | PRN

## 2013-04-13 NOTE — Telephone Encounter (Signed)
Pt called and would like a refill on Tramadol left up front for pickup, she will be here later today at 2pm to bring someone in for an appointment. JW

## 2013-04-13 NOTE — Telephone Encounter (Signed)
Done, placed up w/ Alvino Chapel for her visit.

## 2013-05-11 ENCOUNTER — Other Ambulatory Visit: Payer: Self-pay | Admitting: Family Medicine

## 2013-05-11 NOTE — Telephone Encounter (Signed)
Patient calls requesting Tramadol refill.

## 2013-05-12 ENCOUNTER — Telehealth: Payer: Self-pay | Admitting: *Deleted

## 2013-05-12 NOTE — Telephone Encounter (Signed)
rx called in for  Tramadol. Jilberto Vanderwall, Virgel Bouquet

## 2013-05-23 ENCOUNTER — Telehealth: Payer: Self-pay | Admitting: Family Medicine
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
## 2013-05-28 ENCOUNTER — Encounter: Payer: Self-pay | Admitting: Family Medicine

## 2013-05-28 ENCOUNTER — Ambulatory Visit (INDEPENDENT_AMBULATORY_CARE_PROVIDER_SITE_OTHER): Payer: Medicare Other | Admitting: Family Medicine

## 2013-05-28 VITALS — BP 130/83 | HR 70 | Temp 98.4°F | Ht 71.0 in | Wt 299.4 lb

## 2013-05-28 DIAGNOSIS — M545 Low back pain: Secondary | ICD-10-CM

## 2013-05-28 NOTE — Patient Instructions (Addendum)
I'm going to send you for an xray.  I don't think anything bad is going on but want to be sure.   Take 2 pills twice a day of the Tramadol for the next several days.  You can do this three times a day if needed.  Continue with the Flexeril.   Use the heating pad.  Continue to be as active as possible like you have been doing.
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
## 2013-05-29 ENCOUNTER — Ambulatory Visit (HOSPITAL_COMMUNITY)
Admission: RE | Admit: 2013-05-29 | Discharge: 2013-05-29 | Disposition: A | Discharge status: Home / Self Care | Payer: No Typology Code available for payment source | Source: Ambulatory Visit | Attending: Family Medicine | Admitting: Family Medicine

## 2013-05-29 DIAGNOSIS — M545 Low back pain, unspecified: Secondary | ICD-10-CM | POA: Insufficient documentation

## 2013-05-29 DIAGNOSIS — S22009A Unspecified fracture of unspecified thoracic vertebra, initial encounter for closed fracture: Secondary | ICD-10-CM | POA: Insufficient documentation

## 2013-05-29 DIAGNOSIS — Y929 Unspecified place or not applicable: Secondary | ICD-10-CM | POA: Insufficient documentation

## 2013-05-29 DIAGNOSIS — R209 Unspecified disturbances of skin sensation: Secondary | ICD-10-CM | POA: Insufficient documentation

## 2013-05-29 DIAGNOSIS — M51379 Other intervertebral disc degeneration, lumbosacral region without mention of lumbar back pain or lower extremity pain: Secondary | ICD-10-CM | POA: Insufficient documentation

## 2013-05-29 DIAGNOSIS — M79609 Pain in unspecified limb: Secondary | ICD-10-CM | POA: Insufficient documentation

## 2013-05-29 DIAGNOSIS — M5137 Other intervertebral disc degeneration, lumbosacral region: Secondary | ICD-10-CM | POA: Insufficient documentation

## 2013-06-01 ENCOUNTER — Telehealth: Payer: Self-pay | Admitting: Family Medicine

## 2013-06-01 NOTE — Telephone Encounter (Signed)
Please advise. Brianna Wheeler  
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
## 2013-06-01 NOTE — Telephone Encounter (Signed)
Pt called and would like Dr. Gwendolyn Grant to call her with the xray results . JW

## 2013-06-09 ENCOUNTER — Encounter: Payer: Self-pay | Admitting: Home Health Services

## 2013-06-09 NOTE — Telephone Encounter (Signed)
Opened in error

## 2013-06-11 ENCOUNTER — Encounter: Payer: Self-pay | Admitting: Family Medicine

## 2013-06-11 ENCOUNTER — Ambulatory Visit (INDEPENDENT_AMBULATORY_CARE_PROVIDER_SITE_OTHER): Payer: Medicare Other | Admitting: Family Medicine

## 2013-06-11 VITALS — BP 125/83 | HR 78 | Temp 98.7°F | Ht 71.0 in | Wt 299.4 lb

## 2013-06-11 DIAGNOSIS — E119 Type 2 diabetes mellitus without complications: Secondary | ICD-10-CM

## 2013-06-11 DIAGNOSIS — M549 Dorsalgia, unspecified: Secondary | ICD-10-CM

## 2013-06-11 MED ORDER — IBUPROFEN 600 MG PO TABS: 600.0000 mg | ORAL_TABLET | Freq: Three times a day (TID) | ORAL | Status: DC | PRN

## 2013-06-11 MED ORDER — TRAMADOL HCL 50 MG PO TABS: ORAL_TABLET | ORAL | Status: DC

## 2013-06-11 NOTE — Patient Instructions (Addendum)
Take the Ibuprofen 600 mg up to 3 times daily.  Take the Tramadol if you need it.  Take an extra Prilosec if this is causing worse reflux.  Keep doing the back exercises.  Leg raises with both legs is good.  Stay as active as possible.  Walking is good.    It takes about 6 weeks to start feeling better.  If it gets worse please let me know immediately!
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
## 2013-06-11 NOTE — Assessment & Plan Note (Signed)
Currently at goal.   No changes to meds today.   Continue with current diet and exercise as back tolerates.

## 2013-06-11 NOTE — Assessment & Plan Note (Signed)
Slowly improving.   To use Ibuprofen for anti-inflammatory effects as well as pain relief.   Tramadol as needed. Continue exercise and back strengthening/stretching.   FU in about 4-6 weeks, sooner if worsening.

## 2013-06-16 ENCOUNTER — Telehealth: Payer: Self-pay | Admitting: Family Medicine

## 2013-06-16 NOTE — Telephone Encounter (Signed)
Pt called to tell Dr. Gwendolyn Grant that the ibuprofen is making her stomach upset. She is using the tramadol and heating pad but can not use the ibuporfen.jw

## 2013-06-17 NOTE — Telephone Encounter (Signed)
Can try Tylenol instead of Ibuprofen, with Tramadol for severe pain.  She should stop taking the Ibuprofen.  Thanks,Jeff

## 2013-06-30 ENCOUNTER — Ambulatory Visit (INDEPENDENT_AMBULATORY_CARE_PROVIDER_SITE_OTHER): Payer: Medicare Other | Admitting: Family Medicine

## 2013-06-30 ENCOUNTER — Encounter: Payer: Self-pay | Admitting: Family Medicine

## 2013-06-30 VITALS — BP 142/87 | HR 71 | Temp 98.2°F | Ht 71.0 in | Wt 201.5 lb

## 2013-06-30 DIAGNOSIS — M549 Dorsalgia, unspecified: Secondary | ICD-10-CM

## 2013-06-30 NOTE — Patient Instructions (Signed)
Take the Tylenol 2 pills three times a day for the next week or so.  At that point you can start backing off to as needed.    Just take the Tramadol if you need it.    Keep doing the exercises like you have been.

## 2013-06-30 NOTE — Progress Notes (Signed)
Subjective:    Brianna Wheeler is a 57 y.o. female who presents to [facility] today for back pain:  1.  Back Pain:  Persists.  Still BL lumbar region.  Walking and back stretching for exercise. Tried Ibuprofen but make her nauseous.  Has not been taking Tylenol. Takes Tramadol twice daily with relief.     The following portions of the patient's history were reviewed and updated as appropriate: allergies, current medications, past medical history, family and social history, and problem list. Patient is a nonsmoker.    PMH reviewed.  Past Medical History  Diagnosis Date  . Hypertension   . Hyperlipidemia   . Diabetes mellitus    Past Surgical History  Procedure Laterality Date  . Tubal ligation      Medications reviewed. Current Outpatient Prescriptions  Medication Sig Dispense Refill  . albuterol (PROAIR HFA) 108 (90 BASE) MCG/ACT inhaler Inhale 2 puffs into the lungs every 4 (four) hours as needed.  8.5 g  1  . amoxicillin (AMOXIL) 875 MG tablet Take 1 tablet (875 mg total) by mouth 2 (two) times daily. X 7 days  14 tablet  0  . aspirin (ASPIR-81) 81 MG EC tablet Take 81 mg by mouth daily.        . cyclobenzaprine (FLEXERIL) 10 MG tablet Take 1 tablet (10 mg total) by mouth 2 (two) times daily as needed.  30 tablet  1  . EPINEPHrine (EPI-PEN) 0.3 mg/0.3 mL DEVI Inject 0.3 mLs (0.3 mg total) into the muscle once.  2 Device  0  . fexofenadine (ALLEGRA) 180 MG tablet Take 180 mg by mouth daily.        . fluticasone (FLONASE) 50 MCG/ACT nasal spray Place 2 sprays in each nostril daily  32 g  12  . hydrochlorothiazide (HYDRODIURIL) 25 MG tablet Take 1 tablet (25 mg total) by mouth daily.  90 tablet  1  . ibuprofen (ADVIL,MOTRIN) 600 MG tablet Take 1 tablet (600 mg total) by mouth every 8 (eight) hours as needed.  30 tablet  1  . levothyroxine (SYNTHROID, LEVOTHROID) 112 MCG tablet Take 1 tablet (112 mcg total) by mouth daily.  90 tablet  1  . metFORMIN (GLUCOPHAGE) 850 MG tablet Take 1 tablet  (850 mg total) by mouth 2 (two) times daily with a meal.  90 tablet  1  . omeprazole (PRILOSEC) 20 MG capsule Take 1 capsule (20 mg total) by mouth daily.  30 capsule  3  . potassium chloride SA (K-DUR,KLOR-CON) 20 MEQ tablet Take 1 tablet (20 mEq total) by mouth daily. X 7 days  7 tablet  0  . simvastatin (ZOCOR) 40 MG tablet Take 1 tablet (40 mg total) by mouth at bedtime.  90 tablet  1  . traMADol (ULTRAM) 50 MG tablet TAKE ONE TABLET BY MOUTH TWICE DAILY AS NEEDED FOR PAIN  60 tablet  2  . traZODone (DESYREL) 150 MG tablet Take 1 tablet (150 mg total) by mouth at bedtime.  30 tablet  3   No current facility-administered medications for this visit.    ROS as above otherwise neg.    Objective:   Physical Exam BP 142/87  Pulse 71  Temp(Src) 98.2 F (36.8 C) (Oral)  Ht 5\' 11"  (1.803 m)  Wt 201 lb 8 oz (91.4 kg)  BMI 28.12 kg/m2 Gen:  Alert, cooperative patient who appears stated age in no acute distress.  Vital signs reviewed. Back:  Normal skin, Spine with normal alignment and no deformity.  No tenderness to vertebral process palpation.  Paraspinous muscles are tender BL lumbar region.   Range of motion is full at neck but still decreased at lumbar sacral regions, better than previous exams..  Straight leg raise is positive for BL back pain.   Neuro:  Sensation and motor function 5/5 bilateral lower extremities.  Patellar and Achilles  DTR's +2 patellar BL    No results found for this or any previous visit (from the past 72 hour(s)).

## 2013-07-01 NOTE — Assessment & Plan Note (Addendum)
To schedule Tylenol and start Tramadol only as needed. See instructions.  To continue her home exercising. She declines any PT stating "this did not work" for her in the past.   FU in 2 weeks to assess for improvement.

## 2013-07-06 ENCOUNTER — Telehealth: Payer: Self-pay | Admitting: Family Medicine

## 2013-07-06 NOTE — Telephone Encounter (Signed)
Patient advised to schedule an appointment to be seen and if Dr Gwendolyn Grant doesn't have an opening to schedule with the soonest Dr available.she voiced understanding Analaya Hoey, Virgel Bouquet

## 2013-07-06 NOTE — Telephone Encounter (Signed)
Pt called and said her back feels like ice. She does not know what she needs to do. Can someone call her and give her advice. JW

## 2013-07-08 ENCOUNTER — Encounter: Payer: Self-pay | Admitting: Family Medicine

## 2013-07-08 ENCOUNTER — Ambulatory Visit (INDEPENDENT_AMBULATORY_CARE_PROVIDER_SITE_OTHER): Payer: Medicare Other | Admitting: Family Medicine

## 2013-07-08 VITALS — BP 128/70 | HR 82 | Temp 99.0°F | Ht 71.0 in | Wt 201.5 lb

## 2013-07-08 DIAGNOSIS — M549 Dorsalgia, unspecified: Secondary | ICD-10-CM

## 2013-07-08 MED ORDER — CYCLOBENZAPRINE HCL 10 MG PO TABS: 10.0000 mg | ORAL_TABLET | Freq: Two times a day (BID) | ORAL | Status: DC

## 2013-07-08 NOTE — Patient Instructions (Signed)
Apply heat to affected area. You can take the Flexeril 2 times a day.  Keep your followup appointment with Dr. Gwendolyn Grant.     Lumbosacral Strain Lumbosacral strain is one of the most common causes of back pain. There are many causes of back pain. Most are not serious conditions. CAUSES  Your backbone (spinal column) is made up of 24 main vertebral bodies, the sacrum, and the coccyx. These are held together by muscles and tough, fibrous tissue (ligaments). Nerve roots pass through the openings between the vertebrae. A sudden move or injury to the back may cause injury to, or pressure on, these nerves. This may result in localized back pain or pain movement (radiation) into the buttocks, down the leg, and into the foot. Sharp, shooting pain from the buttock down the back of the leg (sciatica) is frequently associated with a ruptured (herniated) disk. Pain may be caused by muscle spasm alone. Your caregiver can often find the cause of your pain by the details of your symptoms and an exam. In some cases, you may need tests (such as X-rays). Your caregiver will work with you to decide if any tests are needed based on your specific exam. HOME CARE INSTRUCTIONS   Avoid an underactive lifestyle. Active exercise, as directed by your caregiver, is your greatest weapon against back pain.  Avoid hard physical activities (tennis, racquetball, waterskiing) if you are not in proper physical condition for it. This may aggravate or create problems.  If you have a back problem, avoid sports requiring sudden body movements. Swimming and walking are generally safer activities.  Maintain good posture.  Avoid becoming overweight (obese).  Use bed rest for only the most extreme, sudden (acute) episode. Your caregiver will help you determine how much bed rest is necessary.  For acute conditions, you may put ice on the injured area.  Put ice in a plastic bag.  Place a towel between your skin and the bag.  Leave  the ice on for 15-20 minutes at a time, every 2 hours, or as needed.  After you are improved and more active, it may help to apply heat for 30 minutes before activities. See your caregiver if you are having pain that lasts longer than expected. Your caregiver can advise appropriate exercises or therapy if needed. With conditioning, most back problems can be avoided. SEEK IMMEDIATE MEDICAL CARE IF:   You have numbness, tingling, weakness, or problems with the use of your arms or legs.  You experience severe back pain not relieved with medicines.  There is a change in bowel or bladder control.  You have increasing pain in any area of the body, including your belly (abdomen).  You notice shortness of breath, dizziness, or feel faint.  You feel sick to your stomach (nauseous), are throwing up (vomiting), or become sweaty.  You notice discoloration of your toes or legs, or your feet get very cold.  Your back pain is getting worse.  You have a fever. MAKE SURE YOU:   Understand these instructions.  Will watch your condition.  Will get help right away if you are not doing well or get worse. Document Released: 04/18/2005 Document Revised: 10/01/2011 Document Reviewed: 10/08/2008 [facility] Patient Information 2014 [place], [place].
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
## 2013-07-08 NOTE — Assessment & Plan Note (Signed)
Patient has been taking Tylenol and tramadol Educated patient on not taking over the recommended dose of Tylenol per day. It sounds as if she is taking about 3 g a day currently. Continue exercises Started Flexeril twice a day when necessary today Encourage patient use a heating pad. She is to keep followup with Dr. Isaiah Blakes.

## 2013-07-14 ENCOUNTER — Other Ambulatory Visit: Payer: Self-pay | Admitting: Family Medicine

## 2013-07-14 NOTE — Telephone Encounter (Signed)
Pt also needs refill on her hydrocholorthiazide

## 2013-07-15 MED ORDER — HYDROCHLOROTHIAZIDE 25 MG PO TABS: 25.0000 mg | ORAL_TABLET | Freq: Every day | ORAL | Status: DC

## 2013-07-15 NOTE — Addendum Note (Signed)
Addended by: Uvaldo Rising on: 07/15/2013 11:15 AM   Modules accepted: Orders

## 2013-07-20 ENCOUNTER — Encounter: Payer: Self-pay | Admitting: Family Medicine

## 2013-07-20 ENCOUNTER — Ambulatory Visit: Payer: Medicare Other | Admitting: Family Medicine

## 2013-07-20 ENCOUNTER — Ambulatory Visit (INDEPENDENT_AMBULATORY_CARE_PROVIDER_SITE_OTHER): Payer: Medicare Other | Admitting: Family Medicine

## 2013-07-20 VITALS — BP 133/81 | HR 72 | Temp 98.2°F | Ht 71.0 in | Wt 302.0 lb

## 2013-07-20 DIAGNOSIS — R059 Cough, unspecified: Secondary | ICD-10-CM | POA: Insufficient documentation

## 2013-07-20 DIAGNOSIS — R05 Cough: Secondary | ICD-10-CM | POA: Insufficient documentation

## 2013-07-20 MED ORDER — BENZONATATE 100 MG PO CAPS: 100.0000 mg | ORAL_CAPSULE | Freq: Two times a day (BID) | ORAL | Status: DC | PRN

## 2013-07-20 NOTE — Patient Instructions (Addendum)
Thank you for coming in,   I think your family gave you a virus. I would continue to try to drink plenty of fluids and get plenty of sleep. This cough may linger for another week.   I will send in tessalon pearls to help with your cough. You will take this two times a day as needed for cough. Please let us know if this is not working because we may be able to increase the dose or if you have any problems with it.   Please call us if the cough is still lingering for 2 or more weeks or if you develop a fever.    Please feel free to call with any questions or concerns at any time, at [phone]. --Dr. Jordan Likes

## 2013-07-20 NOTE — Progress Notes (Signed)
    Subjective:     Patient ID: Brianna Wheeler, female   DOB: 09-13-1955, 58 y.o.   MRN: [id number]  HPI Brianna Wheeler is a 57 year old female that presents in same day clinic for coughing.  She states the coughing started last Tuesday. She had a large family gathering where several members of her family were sick. She describes the cough occurring daily. She has used Robitussin and Tylenol with no relief. She says it's worse at nighttime and also when she is talking a lot. It also exacerbated when she is moving around a lot. She has used albuterol twice in the past week, twice on Thursday and twice on Saturday. She has been sleeping with more pillows behind her head, roughly 4-5 pillows. She weighs 302 pounds today which is an increase from 299 pounds from her last visit but she denies any shortness of breath. She has had maxillary sinus pressure, sinus drainage, rhinorrhea, chest congestion, watery eyes and a decreased appetite. She has felt hot and cold but has not measured her temperature. She denies any muscle aches and feels better when she is sitting up.  Review of Systems All other systems reviewed and otherwise normal.      Objective:   Physical Exam BP 133/81  Pulse 72  Temp(Src) 98.2 F (36.8 C) (Oral)  Ht 5\' 11"  (1.803 m)  Wt 302 lb (136.986 kg)  BMI 42.14 kg/m2  SpO2 98% Gen: NAD, alert, cooperative with exam HEENT: No lymphadenopathy, tenderness to palpation along the throat, oropharynx clear CV: RRR, good S1/S2, no murmur Resp: CTABL, no wheezes, non-labored, no extra work of breathing     Assessment:         Plan:

## 2013-07-20 NOTE — Assessment & Plan Note (Addendum)
Most likely related to viral origin (rhinorrhea, maxillary sinus pressure, watery eyes, sick contacts with viral type symptoms). Reassuring physical exam with clear breath sounds and no shortness of breath. - Tessalon Perles 100 mg twice a day as needed. Informed to call back if not tolerating medication or may increase medication if still coughing - Also advised to try Vicks VapoRub and use a cool mist humidifier. - Return if coughing still persists past 2 weeks from today

## 2013-07-22 ENCOUNTER — Encounter: Payer: Self-pay | Admitting: Family Medicine

## 2013-07-22 ENCOUNTER — Ambulatory Visit (INDEPENDENT_AMBULATORY_CARE_PROVIDER_SITE_OTHER): Payer: Medicare Other | Admitting: Family Medicine

## 2013-07-22 VITALS — BP 147/94 | HR 80 | Temp 98.7°F | Ht 71.0 in | Wt 301.0 lb

## 2013-07-22 DIAGNOSIS — J069 Acute upper respiratory infection, unspecified: Secondary | ICD-10-CM

## 2013-07-22 DIAGNOSIS — R05 Cough: Secondary | ICD-10-CM

## 2013-07-22 MED ORDER — BENZONATATE 100 MG PO CAPS: 100.0000 mg | ORAL_CAPSULE | Freq: Two times a day (BID) | ORAL | Status: DC | PRN

## 2013-07-22 NOTE — Progress Notes (Signed)
Patient ID: KESHANNA RISO, female   DOB: 1956/07/07, 57 y.o.   MRN: [id number]  Marikay Alar, MD Phone: [phone]  JAQUANA GEIGER is a 57 y.o. female who presents today for f/u URI.  Patient seen on Monday for URI. Has continued to have symptoms of sinus congestion, sore throat, post nasal drip, and productive cough with yellow sputum. Notes this started 8 days ago and has worsened. She had sick contacts at that time. She reports the cough is the worst part of this. She has tried robitussin, tylenol, tramadol, and delsym without benefit. She was given a prescription for tessalon perles, though her insurance would not fill this.   Past Medical History  Diagnosis Date  . Hypertension   . Hyperlipidemia   . Diabetes mellitus     History  Smoking status  . Never Smoker   Smokeless tobacco  . Never Used    Family History  Problem Relation Age of Onset  . Cancer Sister     brain    Current Outpatient Prescriptions on File Prior to Visit  Medication Sig Dispense Refill  . albuterol (PROAIR HFA) 108 (90 BASE) MCG/ACT inhaler Inhale 2 puffs into the lungs every 4 (four) hours as needed.  8.5 g  1  . cyclobenzaprine (FLEXERIL) 10 MG tablet Take 1 tablet (10 mg total) by mouth 2 (two) times daily.  60 tablet  0  . fexofenadine (ALLEGRA) 180 MG tablet Take 180 mg by mouth daily.        . fluticasone (FLONASE) 50 MCG/ACT nasal spray Place 2 sprays in each nostril daily  32 g  12  . hydrochlorothiazide (HYDRODIURIL) 25 MG tablet Take 1 tablet (25 mg total) by mouth daily.  90 tablet  1  . levothyroxine (SYNTHROID, LEVOTHROID) 112 MCG tablet Take 1 tablet (112 mcg total) by mouth daily.  90 tablet  1  . metFORMIN (GLUCOPHAGE) 850 MG tablet Take 1 tablet (850 mg total) by mouth 2 (two) times daily with a meal.  90 tablet  1  . omeprazole (PRILOSEC) 20 MG capsule Take 1 capsule (20 mg total) by mouth daily.  30 capsule  3  . simvastatin (ZOCOR) 40 MG tablet TAKE ONE TABLET BY MOUTH AT  BEDTIME  90 tablet  0  . traMADol (ULTRAM) 50 MG tablet TAKE ONE TABLET BY MOUTH TWICE DAILY AS NEEDED FOR PAIN  60 tablet  2  . aspirin (ASPIR-81) 81 MG EC tablet Take 81 mg by mouth daily.        Marland Kitchen EPINEPHrine (EPI-PEN) 0.3 mg/0.3 mL DEVI Inject 0.3 mLs (0.3 mg total) into the muscle once.  2 Device  0  . ibuprofen (ADVIL,MOTRIN) 600 MG tablet Take 1 tablet (600 mg total) by mouth every 8 (eight) hours as needed.  30 tablet  1  . omeprazole (PRILOSEC) 20 MG capsule TAKE TWO CAPSULES BY MOUTH ONCE DAILY.  60 capsule  2  . potassium chloride SA (K-DUR,KLOR-CON) 20 MEQ tablet Take 1 tablet (20 mEq total) by mouth daily. X 7 days  7 tablet  0  . traZODone (DESYREL) 150 MG tablet Take 1 tablet (150 mg total) by mouth at bedtime.  30 tablet  3   No current facility-administered medications on file prior to visit.    ROS: Per HPI   Physical Exam Filed Vitals:   07/22/13 1104  BP: 147/94  Pulse: 80  Temp: 98.7 F (37.1 C)    Physical Examination: General appearance - overweight Eyes -  PERRL, no discharge, no conjunctivitis Ears - bilateral TM's and external ear canals normal Nose - normal and patent, no erythema, discharge or polyps Mouth - mucous membranes moist, pharynx normal without lesions Neck - supple, no significant adenopathy Chest - clear to auscultation, no wheezes, rales or rhonchi, symmetric air entry Heart - normal rate, regular rhythm, normal S1, S2, no murmurs, rubs, clicks or gallops   Assessment/Plan: Please see individual problem list.

## 2013-07-22 NOTE — Assessment & Plan Note (Signed)
Patient with continued URI symptoms (cough, congestion, post nasal drip) without signs of bacterial infection (no fever, normal lung exam, no decreased O2 sat) or asthma exacerbation (no wheezing and normal respiratory effort). Discussed insurance issue with the patient and many of the medications we prescribe for cough are not covered by her insurance. She stated the tessalon perles would cost $25 and she would try to buy this prescription. Advised that she could use honey in warm tea for this as well. Discussed return if not improving early next week. Return precautions given in AVS.

## 2013-07-22 NOTE — Patient Instructions (Signed)
Nice to meet you. Please pick up the tessalon perles for your cough. If you are not improving early next week please come back to be seen.  Upper Respiratory Infection, Adult An upper respiratory infection (URI) is also known as the common cold. It is often caused by a type of germ (virus). Colds are easily spread (contagious). You can pass it to others by kissing, coughing, sneezing, or drinking out of the same glass. Usually, you get better in 1 or 2 weeks.  HOME CARE   Only take medicine as told by your doctor.  Use a warm mist humidifier or breathe in steam from a hot shower.  Drink enough water and fluids to keep your pee (urine) clear or pale yellow.  Get plenty of rest.  Return to work when your temperature is back to normal or as told by your doctor. You may use a face mask and wash your hands to stop your cold from spreading. GET HELP RIGHT AWAY IF:   After the first few days, you feel you are getting worse.  You have questions about your medicine.  You have chills, shortness of breath, or brown or red spit (mucus).  You have yellow or brown snot (nasal discharge) or pain in the face, especially when you bend forward.  You have a fever, puffy (swollen) neck, pain when you swallow, or white spots in the back of your throat.  You have a bad headache, ear pain, sinus pain, or chest pain.  You have a high-pitched whistling sound when you breathe in and out (wheezing).  You have a lasting cough or cough up blood.  You have sore muscles or a stiff neck. MAKE SURE YOU:   Understand these instructions.  Will watch your condition.  Will get help right away if you are not doing well or get worse. Document Released: 12/26/2007 Document Revised: 10/01/2011 Document Reviewed: 11/13/2010 [facility] Patient Information 2014 [place], [place].

## 2013-07-25 ENCOUNTER — Telehealth: Payer: Self-pay | Admitting: Family Medicine

## 2013-07-25 NOTE — Telephone Encounter (Signed)
[facility] Call  Patient calls stating she has been feeling poorly since christmas eve. She has been seen in clinic multiple times over the past week for what sounded like a viral URI. She has been using tessalon perles for her cough, but this has not gotten better. She states she has been coughing all the time and now her neck has started to hurt. She also notes not eating well, though has continued to drink ok. I advised the patient to go to an urgent care for evaluation. Given continued symptoms the patient may have developed a PNA or a bronchitis and warrants further evaluation.  Marikay AlarEric Zachrey Deutscher, MD [facility] PGY-2

## 2013-07-27 ENCOUNTER — Encounter: Payer: Self-pay | Admitting: Family Medicine

## 2013-07-27 ENCOUNTER — Ambulatory Visit (INDEPENDENT_AMBULATORY_CARE_PROVIDER_SITE_OTHER): Payer: Medicare Other | Admitting: Family Medicine

## 2013-07-27 VITALS — BP 130/70 | HR 79 | Temp 99.5°F | Ht 71.0 in | Wt 300.0 lb

## 2013-07-27 DIAGNOSIS — R059 Cough, unspecified: Secondary | ICD-10-CM

## 2013-07-27 DIAGNOSIS — R05 Cough: Secondary | ICD-10-CM

## 2013-07-27 MED ORDER — PREDNISONE 20 MG PO TABS: 60.0000 mg | ORAL_TABLET | Freq: Every day | ORAL | Status: AC

## 2013-07-27 MED ORDER — DEXTROMETHORPHAN POLISTIREX 30 MG/5ML PO LQCR: 30.0000 mg | Freq: Every evening | ORAL | Status: DC | PRN

## 2013-07-27 NOTE — Patient Instructions (Addendum)
Brianna MannersBarbara F Wheeler, it was a pleasure seeing you today. Today we talked about your coughing. I will give you some cough medication to help. We will also do a steroid burst to see if that helps with your symptoms. While taking the steroids, please check your blood sugar daily in the morning. If your blood sugar starts to increase more than usual, please call me at the office.    If you have any questions or concerns, please do not hesitate to call the office at [phone].  Sincerely,  Jacquelin Hawkingalph Absalom Aro, MD

## 2013-07-27 NOTE — Progress Notes (Signed)
   Subjective:    Patient ID: Brianna Wheeler, female    DOB: 02/14/1956, 58 y.o.   MRN: [id number]  Cough Episode onset: 2 weeks ago. The problem has been unchanged. The problem occurs constantly. The cough is productive of sputum (Clear in appearance). Associated symptoms include chest pain (sharp, left-sided. Only painful when coughing. No radiations to arm or jaw.) and ear pain. Pertinent negatives include no fever, hemoptysis, postnasal drip, shortness of breath or wheezing. She has tried a beta-agonist inhaler and OTC cough suppressant for the symptoms. The treatment provided mild relief. Her past medical history is significant for asthma.  She has had the flu shot and was recently diagnosed with a URI. Her URI symptoms have improved overall but she still retained a persistent cough. The cough has prevented her from sleeping but she does not endorse shortness of breath or orthopnea. She also has some associated ear fullness with no drainage. It is improved with flonase.     Review of Systems  Constitutional: Negative for fever.  HENT: Positive for ear pain. Negative for postnasal drip.   Respiratory: Positive for cough. Negative for hemoptysis, shortness of breath and wheezing.   Cardiovascular: Positive for chest pain (sharp, left-sided. Only painful when coughing. No radiations to arm or jaw.).       Objective:   Physical Exam  Constitutional: She appears well-developed and well-nourished.  HENT:  Left Ear: Tympanic membrane normal. No mastoid tenderness. Tympanic membrane is not erythematous.  Neck: Normal range of motion. Neck supple. No thyromegaly present.  Cardiovascular: Normal rate, regular rhythm and normal heart sounds.   Pulmonary/Chest: Effort normal. No accessory muscle usage. Not tachypneic. No respiratory distress. She has decreased breath sounds (mild). She has wheezes (mild). She exhibits tenderness (on left side of chest and back. Pain is reproducible.).    Musculoskeletal:       Right ankle: She exhibits no swelling.       Left ankle: She exhibits no swelling.          Assessment & Plan:

## 2013-07-27 NOTE — Assessment & Plan Note (Addendum)
Coughing is still persisting and is improved with inhaler. Most likely asthma symptoms precipitated by recent URI. Will try short burst steroid therapy and reassess in 7 days. Also recommended dextromethorphan for use before bed.

## 2013-07-28 ENCOUNTER — Telehealth: Payer: Self-pay | Admitting: Family Medicine

## 2013-07-28 NOTE — Telephone Encounter (Signed)
Pt called back to tell the doctor that she feels like she is having drainage in the back of her throat. She feel that she is coughing more and blowing her nose more. She was told to call the doctor if her symptoms changed. jw

## 2013-07-29 NOTE — Telephone Encounter (Signed)
Will forward to Dr. Caleb PoppNettey who saw patient last.

## 2013-07-30 NOTE — Telephone Encounter (Signed)
Called patient and did not answer. Left voicemail that I will call again tomorrow.

## 2013-07-31 NOTE — Telephone Encounter (Signed)
Spoke with patient today. She has had a one time fever of 101.5, which responded to tylenol. She says that she is feeling drainage down the back of her throat. She is also coughing up yellow sputum, which only happens after she feels drainage. She has been using Flonase and delsym with not much improvement. She will finish her steroid treatment today and states that she has seen some improvement from that therapy. She has some associated pressure below eyes on each side of her nose. She has used a warm towel on face, which has helped. She has also used a neti pot, which has not helped.  Told patient to continue taking tylenol for fever and to call again if her symptoms continue to worsen. Will have her wait over the weekend, in case symptoms improve, otherwise, she can make an appointment to be seen on Monday.

## 2013-07-31 NOTE — Telephone Encounter (Signed)
Pt returned call. She wants an antibotic Please advise

## 2013-08-03 ENCOUNTER — Encounter: Payer: Self-pay | Admitting: Family Medicine

## 2013-08-03 ENCOUNTER — Ambulatory Visit (INDEPENDENT_AMBULATORY_CARE_PROVIDER_SITE_OTHER): Payer: Medicare Other | Admitting: Family Medicine

## 2013-08-03 VITALS — BP 146/81 | HR 92 | Temp 98.8°F | Ht 71.0 in | Wt 301.0 lb

## 2013-08-03 DIAGNOSIS — R059 Cough, unspecified: Secondary | ICD-10-CM

## 2013-08-03 DIAGNOSIS — J069 Acute upper respiratory infection, unspecified: Secondary | ICD-10-CM

## 2013-08-03 DIAGNOSIS — R05 Cough: Secondary | ICD-10-CM

## 2013-08-03 DIAGNOSIS — J019 Acute sinusitis, unspecified: Secondary | ICD-10-CM

## 2013-08-03 MED ORDER — BENZONATATE 100 MG PO CAPS
100.0000 mg | ORAL_CAPSULE | Freq: Two times a day (BID) | ORAL | Status: DC | PRN
Start: 2013-08-03 — End: 2014-01-14

## 2013-08-03 MED ORDER — AMOXICILLIN 500 MG PO CAPS: 500.0000 mg | ORAL_CAPSULE | Freq: Three times a day (TID) | ORAL | Status: DC

## 2013-08-03 NOTE — Assessment & Plan Note (Signed)
A: Appears pt has had a viral illness for 3+ weeks, overall resolving, but now has a superimposed sinusitis (>10 days of symptoms, moderate severity, creamy nasal discharge, on-and-off fevers, and maxillary and frontal sinus tenderness). Cough is also persistent, likely related to continued sinus drainage. Doubt pneumonia with pulse ox >97% and no focal abnormality on lung exam.  P: Discussed supportive care and OTC medications for symptoms; also gave Rx for Tessalon (helped cough previously), and amoxicillin 500 mg TID for 10 days. Instructed pt to follow up in 10 days for re-evaluation, or sooner if needed. Red flags reviewed (including worse difficulty breathing, higher fevers, N/V, etc).

## 2013-08-03 NOTE — Assessment & Plan Note (Signed)
Likely viral, resolving, with superimposed sinusitis. See also sinusitis problem list note.

## 2013-08-03 NOTE — Progress Notes (Signed)
   Subjective:    Patient ID: Brianna Wheeler, female    DOB: 04/12/1956, 58 y.o.   MRN: [id number]  HPI: Pt presents to clinic for SDA for continued cough, nasal congestion / drainage, and occasional SOB with activity. Pt reports symptoms started with sore throat on Christmas Eve (about 2.5 weeks ago), with worsening cough over the next few days and no relief with Robitussin. She has had a few clinic visits with no resolution of symptoms. Cough is worse and she has significant ear pain / sensation of being "clogged" on the left and hard to hear on that side. Pt has had Robitussin, Delsym, and Tessalon perles, none of which has helped with any lasting effect and she is now out of them; she got a course of prednisone over the past week, which also didn't help. She sits up because of the sensation of her nose / throat drainage feeling like she chokes, and her cough is worse. She has also used Afrin nasal. She has had intermittent fevers, highest of 101, which has been helped with Tylenol. Pt has been drinking plenty of fluids but has not had much of an appetite and is very tired / fatigued.  Review of Systems: As above. Symptoms (especially cough) are worse with cold air. Rhinorrhea has been worse, and is yellow, thick, "creamy."     Objective:   Physical Exam BP 146/81  Pulse 92  Temp(Src) 98.8 F (37.1 C) (Oral)  Ht 5\' 11"  (1.803 m)  Wt 301 lb (136.533 kg)  BMI 42.00 kg/m2 Gen: elderly female in NAD, but with loud, persistent cough, worse with talking for interview / breathing for exam HEENT: [place] / AT, PERRLA, MMM, EOMI  Small amount of yellowish nasal discharge, L > R  Right TM normal  Left TM with dullness in the lower half and ?air-fluid level, but not grossly bulging / red  Marked sinus tenderness in frontal and maxillary regions  Moderate cervical tenderness, slightly worse on left Cardio: RRR, no murmur appreciated Pulm: CTAB, mild expiratory wheeze, but good air movement; mildly  increased WOB due to cough Ext: warm, well-perfused, no LE noted     Assessment & Plan:

## 2013-08-03 NOTE — Patient Instructions (Addendum)
Thank you for coming in, today!  I think you may have had the bad virus that people have been having, but now you might have a sinus / ear infection on top of it. I want you to take amoxicillin 500 mg three times a day, for 10 days. I also refilled your Tessalon. Make sure you swallow it, DO NOT chew it. You can get Mucinex (guaifenesin) over the counter to help with congestion. You should continue to drink plenty of fluids.  Come back to see us in about 10 days, or sooner if you need.  Please feel free to call with any questions or concerns at any time, at [phone]. --Dr. Casper HarrisonStreet

## 2013-08-03 NOTE — Assessment & Plan Note (Signed)
Related to viral URI and likely sinusitis. Rx for Tessalon, also suggested Mucinex. See also sinusitis problem list note.

## 2013-08-20 ENCOUNTER — Ambulatory Visit (INDEPENDENT_AMBULATORY_CARE_PROVIDER_SITE_OTHER): Payer: Medicare Other | Admitting: Family Medicine

## 2013-08-20 ENCOUNTER — Encounter: Payer: Self-pay | Admitting: Family Medicine

## 2013-08-20 VITALS — BP 122/84 | HR 79 | Temp 98.0°F | Ht 71.0 in | Wt 306.4 lb

## 2013-08-20 DIAGNOSIS — IMO0002 Reserved for concepts with insufficient information to code with codable children: Secondary | ICD-10-CM

## 2013-08-20 DIAGNOSIS — M62838 Other muscle spasm: Secondary | ICD-10-CM

## 2013-08-20 NOTE — Patient Instructions (Signed)
I'm very glad that you're back is much better!  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  Hold your neck for about 15 seconds each direction.    I'll see you in a couple of months

## 2013-08-20 NOTE — Progress Notes (Signed)
Subjective:    Brianna Wheeler is a 58 y.o. female who [facility] for back pain:  1.  Back pain:  Much improved.  Back to her usual mild back pain.  Sharp stabbing pains she was having are completely resolved.  2.  Ear pain:  Present Left side.  Was told she had "fluid behind her ears."  Some stiffness left side of neck.  NO injuries or trauma   ROS as above per HPI, otherwise neg.    The following portions of the patient's history were reviewed and updated as appropriate: allergies, current medications, past medical history, family and social history, and problem list. Patient is a nonsmoker.    PMH reviewed.  Past Medical History  Diagnosis Date  . Hypertension   . Hyperlipidemia   . Diabetes mellitus    Past Surgical History  Procedure Laterality Date  . Tubal ligation      Medications reviewed. Current Outpatient Prescriptions  Medication Sig Dispense Refill  . albuterol (PROAIR HFA) 108 (90 BASE) MCG/ACT inhaler Inhale 2 puffs into the lungs every 4 (four) hours as needed.  8.5 g  1  . amoxicillin (AMOXIL) 500 MG capsule Take 1 capsule (500 mg total) by mouth 3 (three) times daily.  30 capsule  0  . aspirin (ASPIR-81) 81 MG EC tablet Take 81 mg by mouth daily.        . benzonatate (TESSALON) 100 MG capsule Take 1 capsule (100 mg total) by mouth 2 (two) times daily as needed for cough.  20 capsule  0  . cyclobenzaprine (FLEXERIL) 10 MG tablet Take 1 tablet (10 mg total) by mouth 2 (two) times daily.  60 tablet  0  . dextromethorphan (DELSYM) 30 MG/5ML liquid Take 5 mLs (30 mg total) by mouth at bedtime as needed for cough.  90 mL  0  . EPINEPHrine (EPI-PEN) 0.3 mg/0.3 mL DEVI Inject 0.3 mLs (0.3 mg total) into the muscle once.  2 Device  0  . fexofenadine (ALLEGRA) 180 MG tablet Take 180 mg by mouth daily.        . fluticasone (FLONASE) 50 MCG/ACT nasal spray Place 2 sprays in each nostril daily  32 g  12  . hydrochlorothiazide (HYDRODIURIL) 25 MG tablet Take 1  tablet (25 mg total) by mouth daily.  90 tablet  1  . ibuprofen (ADVIL,MOTRIN) 600 MG tablet Take 1 tablet (600 mg total) by mouth every 8 (eight) hours as needed.  30 tablet  1  . levothyroxine (SYNTHROID, LEVOTHROID) 112 MCG tablet Take 1 tablet (112 mcg total) by mouth daily.  90 tablet  1  . metFORMIN (GLUCOPHAGE) 850 MG tablet Take 1 tablet (850 mg total) by mouth 2 (two) times daily with a meal.  90 tablet  1  . omeprazole (PRILOSEC) 20 MG capsule Take 1 capsule (20 mg total) by mouth daily.  30 capsule  3  . omeprazole (PRILOSEC) 20 MG capsule TAKE TWO CAPSULES BY MOUTH ONCE DAILY.  60 capsule  2  . potassium chloride SA (K-DUR,KLOR-CON) 20 MEQ tablet Take 1 tablet (20 mEq total) by mouth daily. X 7 days  7 tablet  0  . simvastatin (ZOCOR) 40 MG tablet TAKE ONE TABLET BY MOUTH AT BEDTIME  90 tablet  0  . traMADol (ULTRAM) 50 MG tablet TAKE ONE TABLET BY MOUTH TWICE DAILY AS NEEDED FOR PAIN  60 tablet  2  . traZODone (DESYREL) 150 MG tablet Take 1 tablet (150 mg total)  by mouth at bedtime.  30 tablet  3   No current facility-administered medications for this visit.     Objective:   Physical Exam BP 122/84  Pulse 79  Temp(Src) 98 F (36.7 C) (Oral)  Ht 5\' 11"  (1.803 m)  Wt 306 lb 6.4 oz (138.982 kg)  BMI 42.75 kg/m2 Gen:  Alert, cooperative patient who appears stated age in no acute distress.  Vital signs reviewed. Ears: Clear BL.  No fluid behind TMs Neck:  Decreased ear-to-shoulder towards the Right.  Reproduces pain. Also with decreased lateral movement to her Right.  Otherwise full motion of neck Back:  Minimal tenderness without spasm lumbar region  No results found for this or any previous visit (from the past 72 hour(s)).

## 2013-08-20 NOTE — Assessment & Plan Note (Signed)
SCM spasm/tenderness. Provided her stretching exercises. Already has Flexeril and Tramadol  FU if needed for this
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
## 2013-08-24 ENCOUNTER — Telehealth: Payer: Self-pay | Admitting: Family Medicine

## 2013-08-24 NOTE — Telephone Encounter (Signed)
Just refilled her tramadol and pharmacy told her this was her last refill. She would need a prescription for next month

## 2013-08-26 MED ORDER — TRAMADOL HCL 50 MG PO TABS: ORAL_TABLET | ORAL | Status: DC

## 2013-08-26 NOTE — Telephone Encounter (Signed)
COmpleted and placed in to be fax box.

## 2013-08-28 ENCOUNTER — Ambulatory Visit (INDEPENDENT_AMBULATORY_CARE_PROVIDER_SITE_OTHER): Payer: Medicare Other | Admitting: Family Medicine

## 2013-08-28 ENCOUNTER — Encounter: Payer: Self-pay | Admitting: Family Medicine

## 2013-08-28 VITALS — BP 125/90 | HR 82 | Temp 100.9°F | Ht 71.0 in | Wt 304.0 lb

## 2013-08-28 DIAGNOSIS — J069 Acute upper respiratory infection, unspecified: Secondary | ICD-10-CM

## 2013-08-28 NOTE — Patient Instructions (Addendum)
Viral Infections A viral infection can be caused by different types of viruses.Most viral infections are not serious and resolve on their own. However, some infections may cause severe symptoms and may lead to further complications. SYMPTOMS Viruses can frequently cause:  Minor sore throat.  Aches and pains.  Headaches.  Runny nose.  Different types of rashes.  Watery eyes.  Tiredness.  Cough.  Loss of appetite.  Gastrointestinal infections, resulting in nausea, vomiting, and diarrhea. These symptoms do not respond to antibiotics because the infection is not caused by bacteria. However, you might catch a bacterial infection following the viral infection. This is sometimes called a "superinfection." Symptoms of such a bacterial infection may include:  Worsening sore throat with pus and difficulty swallowing.  Swollen neck glands.  Chills and a high or persistent fever.  Severe headache.  Tenderness over the sinuses.  Persistent overall ill feeling (malaise), muscle aches, and tiredness (fatigue).  Persistent cough.  Yellow, green, or brown mucus production with coughing. HOME CARE INSTRUCTIONS   Only take over-the-counter or prescription medicines for pain, discomfort, diarrhea, or fever as directed by your caregiver.  Drink enough water and fluids to keep your urine clear or pale yellow. Sports drinks can provide valuable electrolytes, sugars, and hydration.  Get plenty of rest and maintain proper nutrition. Soups and broths with crackers or rice are fine. SEEK IMMEDIATE MEDICAL CARE IF:   You have severe headaches, shortness of breath, chest pain, neck pain, or an unusual rash.  You have uncontrolled vomiting, diarrhea, or you are unable to keep down fluids.  You have  oral temperature above 102 F (38.9 C), not controlled by medicine.   MAKE SURE YOU:   Understand these instructions.  Will watch your condition.  Will get help right away if you are  not doing well or get worse. Document Released: 04/18/2005 Document Revised: 10/01/2011 Document Reviewed: 11/13/2010 [facility] Patient Information 2014 [place].

## 2013-08-29 ENCOUNTER — Emergency Department (INDEPENDENT_AMBULATORY_CARE_PROVIDER_SITE_OTHER): Payer: Medicare Other

## 2013-08-29 ENCOUNTER — Encounter (HOSPITAL_COMMUNITY): Payer: Self-pay | Admitting: Emergency Medicine

## 2013-08-29 ENCOUNTER — Emergency Department (INDEPENDENT_AMBULATORY_CARE_PROVIDER_SITE_OTHER)
Admission: EM | Admit: 2013-08-29 | Discharge: 2013-08-29 | Disposition: A | Discharge status: Home / Self Care | Payer: Medicare Other | Source: Home / Self Care | Attending: Family Medicine | Admitting: Family Medicine

## 2013-08-29 DIAGNOSIS — J45901 Unspecified asthma with (acute) exacerbation: Secondary | ICD-10-CM

## 2013-08-29 MED ORDER — GUAIFENESIN-CODEINE 100-10 MG/5ML PO SOLN: 5.0000 mL | Freq: Every evening | ORAL | Status: DC | PRN

## 2013-08-29 MED ORDER — IPRATROPIUM-ALBUTEROL 0.5-2.5 (3) MG/3ML IN SOLN
3.0000 mL | RESPIRATORY_TRACT | Status: DC
  Administered 2013-08-29: 3 mL via RESPIRATORY_TRACT

## 2013-08-29 MED ORDER — IPRATROPIUM-ALBUTEROL 0.5-2.5 (3) MG/3ML IN SOLN
RESPIRATORY_TRACT | Status: AC
  Filled 2013-08-29: qty 3

## 2013-08-29 MED ORDER — ALBUTEROL SULFATE HFA 108 (90 BASE) MCG/ACT IN AERS: 2.0000 | INHALATION_SPRAY | Freq: Four times a day (QID) | RESPIRATORY_TRACT | Status: DC | PRN

## 2013-08-29 MED ORDER — PREDNISONE 50 MG PO TABS: 50.0000 mg | ORAL_TABLET | Freq: Every day | ORAL | Status: DC

## 2013-08-29 NOTE — ED Provider Notes (Signed)
Brianna Wheeler is a 58 y.o. female who presents to Urgent Care today for 2-3 days of mild productive cough congestion wheezing and fever. She additionally has some mild shortness of breath. [facility] was given a nebulizer breathing treatment which only helped temporarily. Her symptoms persist. No nausea vomiting or diarrhea. She uses her home albuterol inhaler which helps some.   Past Medical History  Diagnosis Date  . Hypertension   . Hyperlipidemia   . Diabetes mellitus    History  Substance Use Topics  . Smoking status: Never Smoker   . Smokeless tobacco: Never Used  . Alcohol Use: No   ROS as above Medications: Current Facility-Administered Medications  Medication Dose Route Frequency Provider Last Rate Last Dose  . ipratropium-albuterol (DUONEB) 0.5-2.5 (3) MG/3ML nebulizer solution 3 mL  3 mL Nebulization Q4H Rodolph BongEvan S Beatric Fulop, MD   3 mL at 08/29/13 1340   Current Outpatient Prescriptions  Medication Sig Dispense Refill  . albuterol (PROAIR HFA) 108 (90 BASE) MCG/ACT inhaler Inhale 2 puffs into the lungs every 4 (four) hours as needed.  8.5 g  1  . fexofenadine (ALLEGRA) 180 MG tablet Take 180 mg by mouth daily.        . hydrochlorothiazide (HYDRODIURIL) 25 MG tablet Take 1 tablet (25 mg total) by mouth daily.  90 tablet  1  . ibuprofen (ADVIL,MOTRIN) 600 MG tablet Take 1 tablet (600 mg total) by mouth every 8 (eight) hours as needed.  30 tablet  1  . metFORMIN (GLUCOPHAGE) 850 MG tablet Take 1 tablet (850 mg total) by mouth 2 (two) times daily with a meal.  90 tablet  1  . omeprazole (PRILOSEC) 20 MG capsule Take 1 capsule (20 mg total) by mouth daily.  30 capsule  3  . simvastatin (ZOCOR) 40 MG tablet TAKE ONE TABLET BY MOUTH AT BEDTIME  90 tablet  0  . albuterol (PROVENTIL HFA;VENTOLIN HFA) 108 (90 BASE) MCG/ACT inhaler Inhale 2 puffs into the lungs every 6 (six) hours as needed for wheezing or shortness of breath.  1 Inhaler  2  .  amoxicillin (AMOXIL) 500 MG capsule Take 1 capsule (500 mg total) by mouth 3 (three) times daily.  30 capsule  0  . aspirin (ASPIR-81) 81 MG EC tablet Take 81 mg by mouth daily.        . benzonatate (TESSALON) 100 MG capsule Take 1 capsule (100 mg total) by mouth 2 (two) times daily as needed for cough.  20 capsule  0  . cyclobenzaprine (FLEXERIL) 10 MG tablet Take 1 tablet (10 mg total) by mouth 2 (two) times daily.  60 tablet  0  . dextromethorphan (DELSYM) 30 MG/5ML liquid Take 5 mLs (30 mg total) by mouth at bedtime as needed for cough.  90 mL  0  . EPINEPHrine (EPI-PEN) 0.3 mg/0.3 mL DEVI Inject 0.3 mLs (0.3 mg total) into the muscle once.  2 Device  0  . fluticasone (FLONASE) 50 MCG/ACT nasal spray Place 2 sprays in each nostril daily  32 g  12  . guaiFENesin-codeine 100-10 MG/5ML syrup Take 5 mLs by mouth at bedtime as needed for cough.  120 mL  0  . levothyroxine (SYNTHROID, LEVOTHROID) 112 MCG tablet Take 1 tablet (112 mcg total) by mouth daily.  90 tablet  1  . omeprazole (PRILOSEC) 20 MG capsule TAKE TWO CAPSULES BY MOUTH ONCE DAILY.  60 capsule  2  . potassium chloride SA (K-DUR,KLOR-CON)  20 MEQ tablet Take 1 tablet (20 mEq total) by mouth daily. X 7 days  7 tablet  0  . predniSONE (DELTASONE) 50 MG tablet Take 1 tablet (50 mg total) by mouth daily.  5 tablet  0  . traMADol (ULTRAM) 50 MG tablet TAKE ONE TABLET BY MOUTH TWICE DAILY AS NEEDED FOR PAIN  60 tablet  2  . traZODone (DESYREL) 150 MG tablet Take 1 tablet (150 mg total) by mouth at bedtime.  30 tablet  3    Exam:  BP 118/75  Pulse 87  Temp(Src) 100 F (37.8 C) (Oral)  Resp 20  SpO2 96% Gen: Well NAD HEENT: EOMI,  MMM Lungs: Normal work of breathing. Coarse breath sounds, prolonged expiratory phase and wheezing bilaterally. Heart: RRR no MRG Abd: NABS, Soft. NT, ND Exts: Brisk capillary refill, warm and well perfused.   Patient was given a DuoNeb nebulizer inhaler which helped  No results found for this or any  previous visit (from the past 24 hour(s)). Dg Chest 2 View  08/29/2013   CLINICAL DATA:  Cough and fever  EXAM: CHEST  2 VIEW  COMPARISON:  January 29, 2010 and August 05, 2007  FINDINGS: Lungs are clear. Heart size and pulmonary vascularity are normal. The left main pulmonary artery is prominent but stable compared to prior studies. This finding is felt to represent an anatomic variant.  There is no appreciable thoracic adenopathy. There is lumbar levoscoliosis with degenerative change in the lower thoracic region. There is stable anterior wedging of a lower thoracic vertebral body.  IMPRESSION: No edema or consolidation. Prominence of the left main pulmonary artery is felt to represent an anatomic variant; this finding is stable compared to prior studies. No adenopathy is appreciable. There is a stable wedge compression fracture in the lower thoracic spine.   Electronically Signed   By: Bretta Bang M.D.   On: 08/29/2013 13:38    Assessment and Plan: 58 y.o. female with asthma exacerbation secondary to viral illness. Possibly influenza. Plan for treatment with prednisone, albuterol, and codeine containing cough medication. Followup with primary care provider.  Discussed warning signs or symptoms. Please see discharge instructions. Patient expresses understanding.    Rodolph Bong, MD 08/29/13 1425

## 2013-08-29 NOTE — ED Notes (Signed)
Pt c/o persistent cold sxs onset Thursday Seen at [facility]; treated while in the office w/ibup and neb tx Sxs include: fevers, cough, nauseas, vomiting, chills Alert w/no signs of acute distress.

## 2013-08-29 NOTE — ED Notes (Signed)
Pt reports she's feeling a little better.

## 2013-08-29 NOTE — Discharge Instructions (Signed)
Thank you for coming in today. Use albuterol. Take prednisone daily for 5 days. Use containing cough medication as needed. Call or go to the emergency room if you get worse, have trouble breathing, have chest pains, or palpitations.  Followup with your Dr.  Asthma, Adult Asthma is a recurring condition in which the airways tighten and narrow. Asthma can make it difficult to breathe. It can cause coughing, wheezing, and shortness of breath. Asthma episodes (also called asthma attacks) range from minor to life-threatening. Asthma cannot be cured, but medicines and lifestyle changes can help control it. CAUSES Asthma is believed to be caused by inherited (genetic) and environmental factors, but its exact cause is unknown. Asthma may be triggered by allergens, lung infections, or irritants in the air. Asthma triggers are different for each person. Common triggers include:   Animal dander.  Dust mites.  Cockroaches.  Pollen from trees or grass.  Mold.  Smoke.  Air pollutants such as dust, household cleaners, hair sprays, aerosol sprays, paint fumes, strong chemicals, or strong odors.  Cold air, weather changes, and winds (which increase molds and pollens in the air).  Strong emotional expressions such as crying or laughing hard.  Stress.  Certain medicines (such as aspirin) or types of drugs (such as beta-blockers).  Sulfites in foods and drinks. Foods and drinks that may contain sulfites include dried fruit, potato chips, and sparkling grape juice.  Infections or inflammatory conditions such as the flu, a cold, or an inflammation of the nasal membranes (rhinitis).  Gastroesophageal reflux disease (GERD).  Exercise or strenuous activity. SYMPTOMS Symptoms may occur immediately after asthma is triggered or many hours later. Symptoms include:  Wheezing.  Excessive nighttime or early morning coughing.  Frequent or severe coughing with a common cold.  Chest  tightness.  Shortness of breath. DIAGNOSIS  The diagnosis of asthma is made by a review of your medical history and a physical exam. Tests may also be performed. These may include:  Lung function studies. These tests show how much air you breath in and out.  Allergy tests.  Imaging tests such as X-rays. TREATMENT  Asthma cannot be cured, but it can usually be controlled. Treatment involves identifying and avoiding your asthma triggers. It also involves medicines. There are 2 classes of medicine used for asthma treatment:   Controller medicines. These prevent asthma symptoms from occurring. They are usually taken every day.  Reliever or rescue medicines. These quickly relieve asthma symptoms. They are used as needed and provide short-term relief. Your health care provider will help you create an asthma action plan. An asthma action plan is a written plan for managing and treating your asthma attacks. It includes a list of your asthma triggers and how they may be avoided. It also includes information on when medicines should be taken and when their dosage should be changed. An action plan may also involve the use of a device called a peak flow meter. A peak flow meter measures how well the lungs are working. It helps you monitor your condition. HOME CARE INSTRUCTIONS   Take medicine as directed by your health care provider. Speak with your health care provider if you have questions about how or when to take the medicines.  Use a peak flow meter as directed by your health care provider. Record and keep track of readings.  Understand and use the action plan to help minimize or stop an asthma attack without needing to seek medical care.  Control your home environment in  the following ways to help prevent asthma attacks:  Do not smoke. Avoid being exposed to secondhand smoke.  Change your heating and air conditioning filter regularly.  Limit your use of fireplaces and wood stoves.  Get  rid of pests (such as roaches and mice) and their droppings.  Throw away plants if you see mold on them.  Clean your floors and dust regularly. Use unscented cleaning products.  Try to have someone else vacuum for you regularly. Stay out of rooms while they are being vacuumed and for a short while afterward. If you vacuum, use a dust mask from a hardware store, a double-layered or microfilter vacuum cleaner bag, or a vacuum cleaner with a HEPA filter.  Replace carpet with wood, tile, or vinyl flooring. Carpet can trap dander and dust.  Use allergy-proof pillows, mattress covers, and box spring covers.  Wash bed sheets and blankets every week in hot water and dry them in a dryer.  Use blankets that are made of polyester or cotton.  Clean bathrooms and kitchens with bleach. If possible, have someone repaint the walls in these rooms with mold-resistant paint. Keep out of the rooms that are being cleaned and painted.  Wash hands frequently. SEEK MEDICAL CARE IF:   You have wheezing, shortness of breath, or a cough even if taking medicine to prevent attacks.  The colored mucus you cough up (sputum) is thicker than usual.  Your sputum changes from clear or white to yellow, green, gray, or bloody.  You have any problems that may be related to the medicines you are taking (such as a rash, itching, swelling, or trouble breathing).  You are using a reliever medicine more than 2 3 times per week.  Your peak flow is still at 50 79% of you personal best after following your action plan for 1 hour. SEEK IMMEDIATE MEDICAL CARE IF:   You seem to be getting worse and are unresponsive to treatment during an asthma attack.  You are short of breath even at rest.  You get short of breath when doing very little physical activity.  You have difficulty eating, drinking, or talking due to asthma symptoms.  You develop chest pain.  You develop a fast heartbeat.  You have a bluish color to your  lips or fingernails.  You are lightheaded, dizzy, or faint.  Your peak flow is less than 50% of your personal best.  You have a fever or persistent symptoms for more than 2 3 days.  You have a fever and symptoms suddenly get worse. MAKE SURE YOU:   Understand these instructions.  Will watch your condition.  Will get help right away if you are not doing well or get worse. Document Released: 07/09/2005 Document Revised: 03/11/2013 Document Reviewed: 02/05/2013 [facility] Patient Information 2014 [place], [place].

## 2013-08-29 NOTE — Assessment & Plan Note (Signed)
Most likely URI.  Albuterol neb in clinic given and wheezes resolved. Ibuprofen given and fever also responded to it.   Symptomatic treatment and use albuterol as needed.  Discussed signs of worsening condition that should prompt re-evaluation. F/u as needed.

## 2013-08-29 NOTE — Progress Notes (Signed)
Family Medicine Office Visit Note   Subjective:   Patient ID: Brianna MannersBarbara F Quilling, female  DOB: 07/17/1956, 58 y.o.. MRN: [id number]   Pt that comes today complaining of nasal congestion, drainage starting yesterday. Today in our office she has temp of 100.9 and reports feels sick. She denies hx of sick contact as she rarely goes outside her house. She has had some no productive cough but has used her inhaler and this seems to calm it down. She also report facial pain and congestion. Denies nausea or vomiting but has poor appetite. Pt reports is able to drink fluids fine. Denies diarrhea, o other systemic symptoms.   Review of Systems:  Pt denies SOB, chest pain, palpitations, headaches, dizziness, numbness or weakness.  Objective:   Physical Exam: Gen:  NAD HEENT: Moist mucous membranes. Clear rhinorrhea present, normal translucency of paranasal/maxilary and frontal sinuses. Oropharynx is erythematous without exudates.  Ears: normal TM bilaterally, normal ear canals.   CV: Regular rate and rhythm, no murmurs rubs or gallops. PULM:   good air movement, but with mild wheezes throughout. No rales ABD: Soft, non tender, non distended, normal bowel sounds EXT: No edema Neuro: Alert and oriented x3. No focalization  Assessment & Plan:

## 2013-09-01 MED ORDER — IBUPROFEN 100 MG/5ML PO SUSP
600.0000 mg | Freq: Once | ORAL | Status: AC
  Administered 2013-09-01: 600 mg via ORAL

## 2013-09-01 MED ORDER — ALBUTEROL SULFATE (2.5 MG/3ML) 0.083% IN NEBU
2.5000 mg | INHALATION_SOLUTION | Freq: Once | RESPIRATORY_TRACT | Status: AC
  Administered 2013-09-01: 2.5 mg via RESPIRATORY_TRACT

## 2013-09-01 NOTE — Addendum Note (Signed)
Addended by: Tanna SavoyPROPOSITO, Quanita Barona S on: 09/01/2013 08:58 AM   Modules accepted: Orders

## 2013-09-07 ENCOUNTER — Ambulatory Visit (INDEPENDENT_AMBULATORY_CARE_PROVIDER_SITE_OTHER): Payer: Medicare Other | Admitting: Family Medicine

## 2013-09-07 ENCOUNTER — Encounter: Payer: Self-pay | Admitting: Family Medicine

## 2013-09-07 VITALS — BP 111/74 | HR 75 | Temp 98.5°F | Wt 304.0 lb

## 2013-09-07 DIAGNOSIS — J069 Acute upper respiratory infection, unspecified: Secondary | ICD-10-CM

## 2013-09-07 NOTE — Patient Instructions (Signed)
I think taking your allegra-d again and using  Neti-Pot will help you. This seems to be the same thing your son has. I am glad you are getting better but this can take 1-2 weeks (upper respiratory infection). See us back if symptoms worsen or do not continue to improve or have not resolved within time frame discussed. Go ahead and see Dr. Gwendolyn GrantWalden early next week to follow up.

## 2013-09-08 ENCOUNTER — Ambulatory Visit: Payer: Medicare Other | Admitting: Family Medicine

## 2013-09-08 NOTE — Progress Notes (Signed)
Brianna ConchStephen Guinn Delarosa, MD Phone: [phone]  Subjective:  Chief complaint-noted  Upper Respiratory Infection Patient complains of symptoms of a URI. Symptoms include congestion, coryza and non productive cough. Left ear also feels clogged though not dizzy or with headache. Onset of symptoms was 3 days ago when her son got sick with similar symptoms, and has been gradually improving since that time. Treatment to date: none. Patient did recently get treatd for asthma exacerbation on 2/7 at urgent care with prednisone, cough syrup, and breathing treatment. Had breathing treatment on our office on 2/6 with similar symptoms. Patient states she had completely improved from this illness before getting sick 3 days ago.  ROS-She denies current wheeze or shortness of breath. No fever/chills/nausea/vomiting during current illness.  Past Medical History- left eye cataract, asthma, OA, DM, HLD, HTN Medications- reviewed and updated Current Outpatient Prescriptions  Medication Sig Dispense Refill  . albuterol (PROAIR HFA) 108 (90 BASE) MCG/ACT inhaler Inhale 2 puffs into the lungs every 4 (four) hours as needed.  8.5 g  1  . albuterol (PROVENTIL HFA;VENTOLIN HFA) 108 (90 BASE) MCG/ACT inhaler Inhale 2 puffs into the lungs every 6 (six) hours as needed for wheezing or shortness of breath.  1 Inhaler  2  . aspirin (ASPIR-81) 81 MG EC tablet Take 81 mg by mouth daily.        . benzonatate (TESSALON) 100 MG capsule Take 1 capsule (100 mg total) by mouth 2 (two) times daily as needed for cough.  20 capsule  0  . cyclobenzaprine (FLEXERIL) 10 MG tablet Take 1 tablet (10 mg total) by mouth 2 (two) times daily.  60 tablet  0  . fexofenadine (ALLEGRA) 180 MG tablet Take 180 mg by mouth daily.        . fluticasone (FLONASE) 50 MCG/ACT nasal spray Place 2 sprays in each nostril daily  32 g  12  . guaiFENesin-codeine 100-10 MG/5ML syrup Take 5 mLs by mouth at bedtime as needed for cough.  120 mL  0  . hydrochlorothiazide  (HYDRODIURIL) 25 MG tablet Take 1 tablet (25 mg total) by mouth daily.  90 tablet  1  . ibuprofen (ADVIL,MOTRIN) 600 MG tablet Take 1 tablet (600 mg total) by mouth every 8 (eight) hours as needed.  30 tablet  1  . levothyroxine (SYNTHROID, LEVOTHROID) 112 MCG tablet Take 1 tablet (112 mcg total) by mouth daily.  90 tablet  1  . metFORMIN (GLUCOPHAGE) 850 MG tablet Take 1 tablet (850 mg total) by mouth 2 (two) times daily with a meal.  90 tablet  1  . omeprazole (PRILOSEC) 20 MG capsule Take 1 capsule (20 mg total) by mouth daily.  30 capsule  3  . simvastatin (ZOCOR) 40 MG tablet TAKE ONE TABLET BY MOUTH AT BEDTIME  90 tablet  0  . traMADol (ULTRAM) 50 MG tablet TAKE ONE TABLET BY MOUTH TWICE DAILY AS NEEDED FOR PAIN  60 tablet  2  . EPINEPHrine (EPI-PEN) 0.3 mg/0.3 mL DEVI Inject 0.3 mLs (0.3 mg total) into the muscle once.  2 Device  0   No current facility-administered medications for this visit.    Objective: BP 111/74  Pulse 75  Temp(Src) 98.5 F (36.9 C) (Oral)  Wt 304 lb (137.893 kg)  SpO2 95% Gen: NAD, resting comfortably, coughs occasionally during visit, obese HEENT: nares erythematous and swollen, oropharynx normal without pharyngeal exudate, TM normal bilaterally, Mucous membranes are moist. Neck: no lymphadenopathy CV: RRR no murmurs rubs or gallops Lungs: CTAB  no crackles, wheeze, rhonchi Ext: no edema Skin: warm, dry  Assessment/Plan:  URI (upper respiratory infection) Gradually improving  Advised of symptomatic care (see AVS), believe patient's blood pressure can tolerate decongestant in allegra-d that she has at home.  Doubt influenza as afebrile  Given reasons for return

## 2013-09-08 NOTE — Assessment & Plan Note (Addendum)
Gradually improving  Advised of symptomatic care (see AVS), believe patient's blood pressure can tolerate decongestant in allegra-d that she has at home.  Doubt influenza as afebrile  Given reasons for return

## 2013-09-17 ENCOUNTER — Ambulatory Visit: Payer: Medicare Other | Admitting: Family Medicine

## 2013-09-24 ENCOUNTER — Encounter: Payer: Self-pay | Admitting: Home Health Services

## 2013-09-24 ENCOUNTER — Telehealth: Payer: Self-pay | Admitting: Home Health Services

## 2013-09-24 ENCOUNTER — Ambulatory Visit (INDEPENDENT_AMBULATORY_CARE_PROVIDER_SITE_OTHER): Payer: Medicare Other | Admitting: Home Health Services

## 2013-09-24 VITALS — BP 114/82 | HR 69 | Temp 97.8°F | Ht 71.0 in | Wt 303.0 lb

## 2013-09-24 DIAGNOSIS — E785 Hyperlipidemia, unspecified: Secondary | ICD-10-CM

## 2013-09-24 DIAGNOSIS — Z0000 Encounter for general adult medical examination without abnormal findings: Secondary | ICD-10-CM

## 2013-09-24 DIAGNOSIS — E119 Type 2 diabetes mellitus without complications: Secondary | ICD-10-CM

## 2013-09-24 LAB — POCT GLYCOSYLATED HEMOGLOBIN (HGB A1C): Hemoglobin A1C: 6.4

## 2013-09-24 NOTE — Telephone Encounter (Signed)
Opened in error

## 2013-09-24 NOTE — Telephone Encounter (Signed)
Pt requesting refills on simvastatin, tramadol, omeprazole, hydrochlorothiazide.  Pt was prescrived Albuterol (Ventolin) from Urgent Care and [employer] called her and said that it would not cover this medication.  Can you please prescribe a new medication for this condition.

## 2013-09-24 NOTE — Progress Notes (Signed)
Patient here for annual wellness visit, patient reports: Risk Factors/Conditions needing evaluation or treatment: Pt does not have any new risk factors that need evaluation. Home Safety: Pt lives with handicapped son in 1 story home.  Pt reports having smoke detectors and adaptive equipment in bathrooms. Other Information: Corrective lens: Pt wears corrective lens for reading.  Pt has regular eye exam.  Detached retina in left eye. Dentures: Pt does not have dentures.  Does not have regular dental exams. Discussed community resources for dental care. Memory: Pt denies memory problems. Patient's Mini Mental Score (recorded in doc. flowsheet): 29 BMI/Exercise:  We discussed strategies for healthy weight loss, portion control and increasing physical activity.  Discussed silver sneakers resource. Balance/Gait: Pt reports no falls in the past year.  We discussed fall prevention and home safety strategies to manage fall risk.  Bladder:  Pt reports not problems with bladder control. Med Adherence:  Discussed importance of taking medications related to HTN, DM, and Cholesterol.  Pt reports 0 missed days in the past 7 days.  ADLs/iADLs:  Pt reports no problems with functional assessments.    Annual Wellness Visit Requirements Recorded Today In  Medical, family, social history Past Medical, Family, Social History Section  Current providers Care team  Current medications Medications  Wt, BP, Ht, BMI Vital signs  Tobacco, alcohol, illicit drug use History  ADL Nurse Assessment  Depression Screening Nurse Assessment  Cognitive impairment Nurse Assessment  Mini Mental Status Document Flowsheet  Fall Risk Fall/Depression  Home Safety Progress Note  End of Life Planning (welcome visit) Social Documentation  Medicare preventative services Progress Note  Risk factors/conditions needing evaluation/treatment Progress Note  Personalized health advice Patient Instructions, goals, letter  Diet & Exercise  Social Documentation  Emergency Contact Social Documentation  Seat Belts Social Documentation  Sun exposure/protection Social Documentation

## 2013-09-25 LAB — LIPID PANEL
CHOL/HDL RATIO: 3.2 ratio
Cholesterol: 145 mg/dL (ref 0–200)
HDL: 45 mg/dL (ref >39)
LDL Cholesterol: 74 mg/dL (ref 0–99)
TRIGLYCERIDES: 131 mg/dL (ref <150)
VLDL: 26 mg/dL (ref 0–40)

## 2013-09-25 MED ORDER — ALBUTEROL SULFATE HFA 108 (90 BASE) MCG/ACT IN AERS: 2.0000 | INHALATION_SPRAY | Freq: Four times a day (QID) | RESPIRATORY_TRACT | Status: DC | PRN

## 2013-09-25 MED ORDER — OMEPRAZOLE 20 MG PO CPDR: 20.0000 mg | DELAYED_RELEASE_CAPSULE | Freq: Every day | ORAL | Status: DC

## 2013-09-25 MED ORDER — TRAMADOL HCL 50 MG PO TABS: ORAL_TABLET | ORAL | Status: DC

## 2013-09-25 MED ORDER — SIMVASTATIN 40 MG PO TABS: ORAL_TABLET | ORAL | Status: DC

## 2013-09-25 MED ORDER — HYDROCHLOROTHIAZIDE 25 MG PO TABS: 25.0000 mg | ORAL_TABLET | Freq: Every day | ORAL | Status: DC

## 2013-09-25 NOTE — Telephone Encounter (Signed)
Refilled Omeprazole, Simvastatin, HCTZ.  Will ask clinic staff to phone in tramadol 60 pills with 2 refills to take twice daily as needed for back pain.  Can attempt Proair as inhaler, unsure if this will be covered.

## 2013-09-25 NOTE — Addendum Note (Signed)
Addended byGwendolyn Grant: Mete Purdum, Newt LukesJEFFREY H on: 09/25/2013 04:26 PM   Modules accepted: Orders

## 2013-09-25 NOTE — Telephone Encounter (Signed)
Called in rx and informed patient of other rx's called in

## 2013-09-30 ENCOUNTER — Encounter: Payer: Self-pay | Admitting: Home Health Services

## 2013-09-30 NOTE — Progress Notes (Signed)
Patient ID: Brianna Wheeler, female   DOB: 07/30/1955, 58 y.o.   MRN: [id number]  I have reviewed the documentation completed by Arlys JohnSuzanne Lineberry. I agree with her assessment and plan.

## 2013-10-06 ENCOUNTER — Encounter: Payer: Self-pay | Admitting: Family Medicine

## 2013-10-06 ENCOUNTER — Ambulatory Visit (INDEPENDENT_AMBULATORY_CARE_PROVIDER_SITE_OTHER): Payer: Medicare Other | Admitting: Family Medicine

## 2013-10-06 VITALS — BP 148/72 | HR 79 | Temp 99.0°F | Ht 71.0 in | Wt 305.0 lb

## 2013-10-06 DIAGNOSIS — E119 Type 2 diabetes mellitus without complications: Secondary | ICD-10-CM

## 2013-10-06 DIAGNOSIS — IMO0002 Reserved for concepts with insufficient information to code with codable children: Secondary | ICD-10-CM

## 2013-10-06 DIAGNOSIS — J45909 Unspecified asthma, uncomplicated: Secondary | ICD-10-CM

## 2013-10-06 MED ORDER — ALBUTEROL SULFATE HFA 108 (90 BASE) MCG/ACT IN AERS: 2.0000 | INHALATION_SPRAY | Freq: Four times a day (QID) | RESPIRATORY_TRACT | Status: DC | PRN

## 2013-10-06 NOTE — Progress Notes (Signed)
Subjective:    Brianna Wheeler is a 58 y.o. female who presents to [facility] today for several issues:  1.  Asthma:  Uses her Albuterol 1-2 a week. Using Ventolin, but received letter from her insurance stating that she needs to switch to [employer], but she is afraid this doesn't work as well.  No coughing at night. No fevers or chills.  No URI symptoms.    2.  Obesity:  Still trying to lose weight.  Has been unable to walk due to cold weather outside.  Still trying to eat more correctly.  Excited about the warmer weather.  Starting program at [facility], [employer].    3.  Back pain:  Spent most of the day yesterday raking her yard.  Back flared back up today.  Took 2 Tramadol, Flexeril and back soaks.  Doesn't do much twisting activity besides raking and sweeping, which is every couple of weeks.  Previously she had been doing well without pain for past several weeks.  No radiation, no incontinence.  Describes as dull 5/10 ache.    ROS as above per HPI, otherwise neg.    The following portions of the patient's history were reviewed and updated as appropriate: allergies, current medications, past medical history, family and social history, and problem list. Patient is a nonsmoker.    PMH reviewed.  Past Medical History  Diagnosis Date  . Hypertension   . Hyperlipidemia   . Diabetes mellitus    Past Surgical History  Procedure Laterality Date  . Tubal ligation      Medications reviewed. Current Outpatient Prescriptions  Medication Sig Dispense Refill  . albuterol (PROAIR HFA) 108 (90 BASE) MCG/ACT inhaler Inhale 2 puffs into the lungs every 6 (six) hours as needed for wheezing or shortness of breath.  6.7 g  1  . aspirin (ASPIR-81) 81 MG EC tablet Take 81 mg by mouth daily.        . benzonatate (TESSALON) 100 MG capsule Take 1 capsule (100 mg total) by mouth 2 (two) times daily as needed for cough.  20 capsule  0  . cyclobenzaprine (FLEXERIL) 10 MG tablet Take 1 tablet (10 mg total) by mouth 2  (two) times daily.  60 tablet  0  . EPINEPHrine (EPI-PEN) 0.3 mg/0.3 mL DEVI Inject 0.3 mLs (0.3 mg total) into the muscle once.  2 Device  0  . fexofenadine (ALLEGRA) 180 MG tablet Take 180 mg by mouth daily.        . fluticasone (FLONASE) 50 MCG/ACT nasal spray Place 2 sprays in each nostril daily  32 g  12  . guaiFENesin-codeine 100-10 MG/5ML syrup Take 5 mLs by mouth at bedtime as needed for cough.  120 mL  0  . hydrochlorothiazide (HYDRODIURIL) 25 MG tablet Take 1 tablet (25 mg total) by mouth daily.  90 tablet  1  . ibuprofen (ADVIL,MOTRIN) 600 MG tablet Take 1 tablet (600 mg total) by mouth every 8 (eight) hours as needed.  30 tablet  1  . levothyroxine (SYNTHROID, LEVOTHROID) 112 MCG tablet Take 1 tablet (112 mcg total) by mouth daily.  90 tablet  1  . metFORMIN (GLUCOPHAGE) 850 MG tablet Take 1 tablet (850 mg total) by mouth 2 (two) times daily with a meal.  90 tablet  1  . omeprazole (PRILOSEC) 20 MG capsule Take 1 capsule (20 mg total) by mouth daily.  30 capsule  3  . simvastatin (ZOCOR) 40 MG tablet TAKE ONE TABLET BY MOUTH AT BEDTIME  90 tablet  0  . traMADol (ULTRAM) 50 MG tablet TAKE ONE TABLET BY MOUTH TWICE DAILY AS NEEDED FOR PAIN  60 tablet  2   No current facility-administered medications for this visit.     Objective:   Physical Exam BP 148/72  Pulse 79  Temp(Src) 99 F (37.2 C) (Oral)  Ht 5\' 11"  (1.803 m)  Wt 305 lb (138.347 kg)  BMI 42.56 kg/m2  SpO2 98% Gen:  Alert, cooperative patient who appears stated age in no acute distress.  Vital signs reviewed. HEENT: Cataract noted Left eye.   Cardiac:  Regular rate and rhythm  MSK:  Minimal TTP right mid-thoracic region.  Minimal spasm noted.  Full range of motion back and neck.   Skin:  NO rash noted Ext:  No edema BL  No results found for this or any previous visit (from the past 72 hour(s)).

## 2013-10-06 NOTE — Assessment & Plan Note (Signed)
Intermittent Attempt switch to Proair. If states this isn't working as well, will attempt Ventolin with insurance prior auth

## 2013-10-06 NOTE — Patient Instructions (Signed)
Try a back brace to help with twisting.  [facility] is a good place to try.    Try the higher dose of Proair and let me know if that works.   Keep walking like you have been, this is great for your back and weight loss.   I [facility] might [facility] the best thing for you.  Make sure you do that.

## 2013-10-06 NOTE — Assessment & Plan Note (Signed)
Improved, until exacerbation from yardwork. No redflags.  Continue analgesia  She would like to try a back brace. Also starting organized walking program.  Weight loss will help.

## 2013-10-06 NOTE — Assessment & Plan Note (Signed)
Needs microalbumin. Order placed, but patient left without obtaiing urine.  Will complete next visit.

## 2013-10-08 ENCOUNTER — Telehealth: Payer: Self-pay | Admitting: Family Medicine

## 2013-10-08 NOTE — Telephone Encounter (Signed)
Patient states the pharmacy told her that she did not have any refill on Prilosec. RX was just refilled on 09/25/13 with 3 refills but pharmacy will not fill. Please resolve issue for patient.

## 2013-10-15 ENCOUNTER — Other Ambulatory Visit: Payer: Self-pay | Admitting: Family Medicine

## 2013-10-16 ENCOUNTER — Other Ambulatory Visit: Payer: Self-pay | Admitting: *Deleted

## 2013-10-16 MED ORDER — OMEPRAZOLE 20 MG PO CPDR: 20.0000 mg | DELAYED_RELEASE_CAPSULE | Freq: Every day | ORAL | Status: DC

## 2013-12-03 ENCOUNTER — Ambulatory Visit (INDEPENDENT_AMBULATORY_CARE_PROVIDER_SITE_OTHER): Payer: Medicare Other | Admitting: Family Medicine

## 2013-12-03 ENCOUNTER — Encounter: Payer: Self-pay | Admitting: Family Medicine

## 2013-12-03 VITALS — BP 124/84 | HR 84 | Temp 99.2°F | Ht 71.0 in | Wt 302.3 lb

## 2013-12-03 DIAGNOSIS — M7989 Other specified soft tissue disorders: Secondary | ICD-10-CM

## 2013-12-03 NOTE — Patient Instructions (Addendum)
Brianna MannersBarbara F Birchard, it was a pleasure seeing you today. Today we talked about your leg swelling. This seems to be cause by your veins having trouble sending blood back to the rest of your body. I am prescribing you compression stockings for when you are up and walking and not able to elevate your legs. When you are at home and able, you can take your stockings off and keep your legs elevated to continue to help with the swelling. If your symptoms do not improve or worsen, please call our office back or make an appointment to be seen  Please make an appointment to see your PCP, Dr. Gwendolyn GrantWalden, in 4 weeks or for an original agreed upon time. Please be seen sooner if needed.  If you have any questions or concerns, please do not hesitate to call the office at [phone].  Sincerely,  Jacquelin Hawkingalph Nettey, MD

## 2013-12-05 NOTE — Assessment & Plan Note (Signed)
Most likely venous insufficiency vs swelling from past ankle injuries. Will prescribe stockings for when patient is ambulating and continued elevation when at home.

## 2013-12-05 NOTE — Progress Notes (Signed)
   Subjective:    Patient ID: Brianna Wheeler, female    DOB: 12/24/1955, 58 y.o.   MRN: [id number]  HPI  Patient presents with 4 day history of left leg swelling. She noticed the [place] after [place]. She recalls going up and down many hills. When her swelling is increased, she develops some pain in her leg. Swelling improves when laying in bed at night and worsens when she is up and walking. She has had this happen before when she first injured her ankle.  Review of Systems  Respiratory: Negative for cough and shortness of breath.   Cardiovascular: Positive for leg swelling. Negative for chest pain.       Objective:   Physical Exam  Constitutional: She appears well-developed and well-nourished.  Cardiovascular: Normal rate, regular rhythm and normal heart sounds.   Pulmonary/Chest: Effort normal and breath sounds normal.  Musculoskeletal:       Right ankle: Normal.       Left ankle: She exhibits normal range of motion. Tenderness (No bony tenderness).       Right lower leg: Normal.       Left lower leg: She exhibits tenderness and edema (1+).          Assessment & Plan:

## 2013-12-10 ENCOUNTER — Ambulatory Visit (INDEPENDENT_AMBULATORY_CARE_PROVIDER_SITE_OTHER): Payer: Medicare Other | Admitting: Home Health Services
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
## 2013-12-22 ENCOUNTER — Encounter: Payer: Self-pay | Admitting: Family Medicine

## 2013-12-22 ENCOUNTER — Ambulatory Visit (INDEPENDENT_AMBULATORY_CARE_PROVIDER_SITE_OTHER): Payer: Medicare Other | Admitting: Family Medicine

## 2013-12-22 VITALS — BP 108/68 | HR 71 | Temp 98.0°F | Ht 71.0 in | Wt 304.0 lb

## 2013-12-22 DIAGNOSIS — M7912 Myalgia of auxiliary muscles, head and neck: Secondary | ICD-10-CM | POA: Insufficient documentation

## 2013-12-22 DIAGNOSIS — M549 Dorsalgia, unspecified: Secondary | ICD-10-CM

## 2013-12-22 DIAGNOSIS — M26609 Unspecified temporomandibular joint disorder, unspecified side: Secondary | ICD-10-CM

## 2013-12-22 MED ORDER — CYCLOBENZAPRINE HCL 10 MG PO TABS: 10.0000 mg | ORAL_TABLET | Freq: Two times a day (BID) | ORAL | Status: DC
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
## 2013-12-22 NOTE — Patient Instructions (Addendum)
It was a pleasure to see you today.  I believe the left ear pain is related to muscular pain around the ear.  I do not see any problems in your left ear canal or ear drum.   I ask that you resume taking the Flexeril (cyclobenzaprine) 10mg  twice daily for the coming 1 week.  Also, gentle heat applied to the left side of your neck periodically.

## 2013-12-22 NOTE — Assessment & Plan Note (Signed)
Tenderness of LEFT Sternocleidomastoid muscle, with referred L ear pain. NO findings on exam or history to suggest ear pathology, other than perhaps her allergy history and some Eustacian tube dysfunction.  I recommended that she use Flexeril 10mg  twice daily and apply gentle heat a couple of times a day, for the SCM healing.  She is to see Dr Gwendolyn Grant for follow up, as well as for refills of her usual Tramadol, which she has enough for the coming week or so.

## 2014-01-14 ENCOUNTER — Ambulatory Visit (INDEPENDENT_AMBULATORY_CARE_PROVIDER_SITE_OTHER): Payer: Medicare Other | Admitting: Family Medicine

## 2014-01-14 ENCOUNTER — Encounter: Payer: Self-pay | Admitting: Family Medicine

## 2014-01-14 VITALS — BP 139/72 | HR 79 | Temp 98.1°F | Ht 71.0 in | Wt 303.1 lb

## 2014-01-14 DIAGNOSIS — E119 Type 2 diabetes mellitus without complications: Secondary | ICD-10-CM

## 2014-01-14 DIAGNOSIS — M25579 Pain in unspecified ankle and joints of unspecified foot: Secondary | ICD-10-CM

## 2014-01-14 DIAGNOSIS — IMO0002 Reserved for concepts with insufficient information to code with codable children: Secondary | ICD-10-CM

## 2014-01-14 DIAGNOSIS — M25572 Pain in left ankle and joints of left foot: Secondary | ICD-10-CM

## 2014-01-14 LAB — POCT URINALYSIS DIPSTICK
BILIRUBIN UA: NEGATIVE
Glucose, UA: NEGATIVE
Ketones, UA: NEGATIVE
NITRITE UA: NEGATIVE
PH UA: 7
PROTEIN UA: NEGATIVE
RBC UA: NEGATIVE
Spec Grav, UA: 1.015
UROBILINOGEN UA: 1

## 2014-01-14 LAB — POCT UA - MICROSCOPIC ONLY

## 2014-01-14 LAB — POCT GLYCOSYLATED HEMOGLOBIN (HGB A1C): HEMOGLOBIN A1C: 6.2

## 2014-01-14 MED ORDER — SIMVASTATIN 40 MG PO TABS: ORAL_TABLET | ORAL | Status: DC

## 2014-01-14 MED ORDER — TRAMADOL HCL 50 MG PO TABS: ORAL_TABLET | ORAL | Status: DC
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
## 2014-01-14 NOTE — Patient Instructions (Signed)
Checking your urine today.  It was good to see you.  Wear your ankle brace whenever you're working outside.   Take the Tramadol for relief.

## 2014-01-15 DIAGNOSIS — M25579 Pain in unspecified ankle and joints of unspecified foot: Secondary | ICD-10-CM | POA: Insufficient documentation

## 2014-01-15 LAB — MICROALBUMIN / CREATININE URINE RATIO
CREATININE, URINE: 141.5 mg/dL
MICROALB/CREAT RATIO: 3.5 mg/g (ref 0.0–30.0)
Microalb, Ur: 0.5 mg/dL (ref 0.00–1.89)

## 2014-01-15 NOTE — Assessment & Plan Note (Signed)
ASO brace in clinic No evidence of fracture or tear currently  FU if no improvement with brace.

## 2014-01-15 NOTE — Assessment & Plan Note (Signed)
Improved, A1C 6.1 at home. Continue Metformin for now.

## 2014-01-15 NOTE — Assessment & Plan Note (Signed)
Stable  Tramadol PRN

## 2014-01-17 ENCOUNTER — Telehealth: Payer: Self-pay | Admitting: Emergency Medicine

## 2014-01-17 NOTE — Telephone Encounter (Signed)
Emergency Line Call Patient is calling about swelling and tenderness of her little finger.  She states she was painting a utility building today and afterwards, she noticed swelling and tenderness of the knuckle of her little finger.  She denies any trauma or bug bites.  The swelling has not worsened as far as she can tell.  I recommended ice and tylenol (cannot tolerate NSAIDs).  I also recommended that she mark the area of swelling and tenderness.  She will call the office in the morning for an appointment Monday or Tuesday.  We also discussed that if the swelling is expanding, she should be seen by a physician tonight.  She expressed understanding and agreement with this plan.

## 2014-01-18 ENCOUNTER — Ambulatory Visit (INDEPENDENT_AMBULATORY_CARE_PROVIDER_SITE_OTHER): Payer: Medicare Other | Admitting: Family Medicine

## 2014-01-18 ENCOUNTER — Encounter: Payer: Self-pay | Admitting: Family Medicine

## 2014-01-18 VITALS — BP 119/75 | HR 76 | Temp 98.2°F | Wt 305.0 lb

## 2014-01-18 DIAGNOSIS — M79645 Pain in left finger(s): Secondary | ICD-10-CM

## 2014-01-18 DIAGNOSIS — M79609 Pain in unspecified limb: Secondary | ICD-10-CM

## 2014-01-18 LAB — URIC ACID: Uric Acid, Serum: 7.6 mg/dL — ABNORMAL HIGH (ref 2.4–7.0)

## 2014-01-18 MED ORDER — PREDNISONE 50 MG PO TABS: ORAL_TABLET | ORAL | Status: DC

## 2014-01-18 NOTE — Patient Instructions (Signed)
You are doing well overall The cause of finger pain is likely due to a mild sprain vs gout.  Please apply the buddy tape and start the prednisone.  Please perform range of motion exercises twice daily.  Please come back if you are not better in 2 weeks.

## 2014-01-18 NOTE — Progress Notes (Signed)
Brianna Wheeler is a 58 y.o. female who [facility] for S[facility] appt for finger pain:  Finger pain: Started yesterday. Occurred immediately after carrying a can of pain. Gallon metal can. No h/o. Decreased ROM w/ flexion. Denies any popping sensation. Swollen. Sensation intact. No other trauma. Ice and tylenol w/ benefit.   The following portions of the patient's history were reviewed and updated as appropriate: allergies, current medications, past medical history, family and social history, and problem list.  Patient is a nonsmoker.   Past Medical History  Diagnosis Date  . Hypertension   . Hyperlipidemia   . Diabetes mellitus     ROS as above otherwise neg.    Medications reviewed. Current Outpatient Prescriptions  Medication Sig Dispense Refill  . albuterol (PROAIR HFA) 108 (90 BASE) MCG/ACT inhaler Inhale 2 puffs into the lungs every 6 (six) hours as needed for wheezing or shortness of breath.  8.5 g  1  . aspirin (ASPIR-81) 81 MG EC tablet Take 81 mg by mouth daily.        . cyclobenzaprine (FLEXERIL) 10 MG tablet Take 1 tablet (10 mg total) by mouth 2 (two) times daily.  60 tablet  0  . EPINEPHrine (EPI-PEN) 0.3 mg/0.3 mL DEVI Inject 0.3 mLs (0.3 mg total) into the muscle once.  2 Device  0  . fexofenadine (ALLEGRA) 180 MG tablet Take 180 mg by mouth daily.        . fluticasone (FLONASE) 50 MCG/ACT nasal spray Place 2 sprays in each nostril daily  32 g  12  . hydrochlorothiazide (HYDRODIURIL) 25 MG tablet Take 1 tablet (25 mg total) by mouth daily.  90 tablet  1  . levothyroxine (SYNTHROID, LEVOTHROID) 112 MCG tablet Take 1 tablet (112 mcg total) by mouth daily.  90 tablet  1  . metFORMIN (GLUCOPHAGE) 850 MG tablet Take 1 tablet (850 mg total) by mouth 2 (two) times daily with a meal.  90 tablet  1  . omeprazole (PRILOSEC) 20 MG capsule Take 1 capsule (20 mg total) by mouth daily.  30 capsule  3  . predniSONE (DELTASONE) 50 MG tablet Take daily w/ breakfast  5 tablet  0  .  simvastatin (ZOCOR) 40 MG tablet TAKE ONE TABLET BY MOUTH AT BEDTIME  90 tablet  2  . traMADol (ULTRAM) 50 MG tablet TAKE ONE TABLET BY MOUTH TWICE DAILY AS NEEDED FOR PAIN  60 tablet  2   No current facility-administered medications for this visit.    Exam:  BP 119/75  Pulse 76  Temp(Src) 98.2 F (36.8 C) (Oral)  Wt 305 lb (138.347 kg) Gen: Well NAD HEENT: EOMI,  MMM MSK: L 5th digit w/ mild erythema and swelling. ttp along the PIP. No bony abnormality or deviation.  No results found for this or any previous visit (from the past 72 hour(s)).  A/P (as seen in Problem list)  Finger pain, left L finger pain likely sprain vs gout. Unlikely fracture or infection, RA. Intolerant to NSAIDs due to GI symptoms Start prednisone x 5 days Cont tylenol 1gm Q8 Buddy tape and ROM exercises Uric acid today Consider xray if not improving.    '

## 2014-01-18 NOTE — Assessment & Plan Note (Signed)
L finger pain likely sprain vs gout. Unlikely fracture or infection, RA. Intolerant to NSAIDs due to GI symptoms Start prednisone x 5 days Cont tylenol 1gm Q8 Buddy tape and ROM exercises Uric acid today Consider xray if not improving.

## 2014-01-20 ENCOUNTER — Telehealth: Payer: Self-pay | Admitting: Family Medicine

## 2014-01-20 NOTE — Telephone Encounter (Signed)
Is taking prednisone for 5 days. Has to go to bathroom 9-12- times at night. Has  Taken it before with not side affects

## 2014-01-21 ENCOUNTER — Encounter: Payer: Self-pay | Admitting: *Deleted

## 2014-01-21 NOTE — Progress Notes (Signed)
Patient here in office today c/o bruise on right inner basilic aspect of her arm.  States she had blood drawn here

## 2014-01-26 ENCOUNTER — Telehealth: Payer: Self-pay | Admitting: Family Medicine

## 2014-01-26 NOTE — Telephone Encounter (Signed)
Would like help in understanding test results

## 2014-02-25 ENCOUNTER — Encounter: Payer: Self-pay | Admitting: Family Medicine

## 2014-02-25 ENCOUNTER — Ambulatory Visit (INDEPENDENT_AMBULATORY_CARE_PROVIDER_SITE_OTHER): Payer: Medicare Other | Admitting: Family Medicine

## 2014-02-25 VITALS — BP 137/84 | HR 72 | Resp 16 | Ht 71.0 in | Wt 307.0 lb

## 2014-02-25 DIAGNOSIS — R071 Chest pain on breathing: Secondary | ICD-10-CM

## 2014-02-25 DIAGNOSIS — R0789 Other chest pain: Secondary | ICD-10-CM

## 2014-02-25 NOTE — Progress Notes (Signed)
   Subjective:    Patient ID: Brianna Wheeler, female    DOB: 01/06/1956, 58 y.o.   MRN: [id number]  Fall   Pt presents for a fall 2 days ago. She was outside installing solar lights and lost her balance. She landed on the ground on her hands and knees and then her arms gave out and she hit her chest on the ground. She reports no pain in arms or legs but severe pain in her right anterior chest wall, especially with twisting or reaching movements. Denies dizziness, LOC, or head trauma.   Review of Systems See HPI    Objective:   Physical Exam  Nursing note and vitals reviewed. Constitutional: She is oriented to person, place, and time. She appears well-developed and well-nourished. No distress.  HENT:  Head: Normocephalic and atraumatic.  Eyes: Conjunctivae are normal. Right eye exhibits no discharge. Left eye exhibits no discharge. No scleral icterus.  Cardiovascular: Normal rate.   Pulmonary/Chest: Effort normal. She exhibits tenderness. She exhibits no mass, no laceration, no crepitus, no edema, no deformity, no swelling and no retraction.    Abdominal: Soft. She exhibits no distension. There is no tenderness.  Musculoskeletal: Normal range of motion. She exhibits no edema and no tenderness.  Neurological: She is alert and oriented to person, place, and time.  Skin: Skin is warm and dry. She is not diaphoretic.  Psychiatric: She has a normal mood and affect. Her behavior is normal.          Assessment & Plan:

## 2014-02-28 DIAGNOSIS — R0789 Other chest pain: Secondary | ICD-10-CM | POA: Insufficient documentation

## 2014-02-28 NOTE — Assessment & Plan Note (Addendum)
Likely muscle strain (intercostal?) from fall - rec ice and tylenol prn as patient already on nsaid - ok to use flexeril more than the typical qhs for this acute injury

## 2014-04-01 ENCOUNTER — Other Ambulatory Visit: Payer: Self-pay | Admitting: *Deleted

## 2014-04-01 ENCOUNTER — Ambulatory Visit (INDEPENDENT_AMBULATORY_CARE_PROVIDER_SITE_OTHER): Payer: Medicare Other | Admitting: *Deleted

## 2014-04-01 DIAGNOSIS — Z23 Encounter for immunization: Secondary | ICD-10-CM

## 2014-04-01 MED ORDER — METFORMIN HCL 850 MG PO TABS: 850.0000 mg | ORAL_TABLET | Freq: Two times a day (BID) | ORAL | Status: DC

## 2014-04-03 ENCOUNTER — Telehealth: Payer: Self-pay | Admitting: Internal Medicine

## 2014-04-03 NOTE — Telephone Encounter (Signed)
[facility] Teaching Service Phone Note  Reason for call:   I received a call from [name] at 230 PM on behalf of herself and [name] indicating that they both came to PCP office on Thursday 04/01/14 and received flu shots and started noticing a rash on Friday.     Pertinent Data:   No prior reactions to flu vaccines  Clinic visit noted for flu vaccination on 9/10  Describes a rash on Brianna Wheeler mainly on her left hand, itchy small red bumps, improving with benadryl cream to area  [name] has similar rash on left leg and arm, not relieved or improved with cream and maybe getting worse  They have OTC po benadryl at home but have not tried that yet  They deny fever, chills, sob, chest pain, headaches, or any urinary complaints.  Brianna Wheeler has an intermittent cough that is not new  She does not think its due to any insect or bug bites as it started after the vaccination  They both report feeling well after receiving the vaccine until Friday morning   Assessment / Plan / Recommendations:   Brianna Wheeler and [name] called for possible allergic reaction to recent flu vaccines.   I have advised them to take 1 tablet of otc po benadryl at this time to see if there is any improvement and have also recommended for them both to get to the nearest urgent care or emergency room for further evaluation and assessment.  While this may be secondary to the vaccination, other etiologies of a pruritic maculopapular rash still need to considered and evaluated.   Brianna Wheeler voiced understanding, will take benadryl and will also go to nearest UC or ED at this time.   I will forward this note to pcp who can try to follow up with patient as well.  As always, pt is advised that if symptoms worsen or new symptoms arise, they should go directly to an urgent care facility or to to ER for further evaluation.   Baltazar Apo, MD   04/03/2014, 2:43 PM

## 2014-04-04 ENCOUNTER — Emergency Department (INDEPENDENT_AMBULATORY_CARE_PROVIDER_SITE_OTHER)
Admission: EM | Admit: 2014-04-04 | Discharge: 2014-04-04 | Disposition: A | Discharge status: Home / Self Care | Payer: Medicare Other | Source: Home / Self Care | Attending: Emergency Medicine | Admitting: Emergency Medicine

## 2014-04-04 ENCOUNTER — Encounter (HOSPITAL_COMMUNITY): Payer: Self-pay | Admitting: Emergency Medicine

## 2014-04-04 DIAGNOSIS — L259 Unspecified contact dermatitis, unspecified cause: Secondary | ICD-10-CM

## 2014-04-04 MED ORDER — BETAMETHASONE DIPROPIONATE AUG 0.05 % EX CREA: TOPICAL_CREAM | Freq: Two times a day (BID) | CUTANEOUS | Status: DC

## 2014-04-04 NOTE — Discharge Instructions (Signed)

## 2014-04-04 NOTE — ED Notes (Signed)
Pt was spreading large amounts of mulch with son 2 days ago; noticed white substance on bag and on mulch.  Both have broken out in small, red, pruritic bumps to bilat wrists.  Has been applying Benadryl cream and taking PO Benadryl with little relief.

## 2014-04-04 NOTE — ED Provider Notes (Signed)
CSN: [id number]     Arrival date & time 04/04/14  1332 History   First MD Initiated Contact with Patient 04/04/14 1409     Chief Complaint  Patient presents with  . Rash   (Consider location/radiation/quality/duration/timing/severity/associated sxs/prior Treatment) HPI  58 year old female presents for evaluation of a rash. Today's ear she was spreading mulch on the ground. The next day she started to have some itchy red bumps on her hands and wrists. This is extremely itchy. She has been applying Benadryl cream and taking oral Benadryl with minimal relief. She is here with her she developed a similar rash after helping with the mulch. She denies any systemic symptoms. She has never had this before.  Past Medical History  Diagnosis Date  . Hypertension   . Hyperlipidemia   . Diabetes mellitus    Past Surgical History  Procedure Laterality Date  . Tubal ligation     Family History  Problem Relation Age of Onset  . Cancer Sister     brain   History  Substance Use Topics  . Smoking status: Never Smoker   . Smokeless tobacco: Never Used  . Alcohol Use: No   OB History   Grav Para Term Preterm Abortions TAB SAB Ect Mult Living                 Review of Systems  Skin: Positive for rash.  All other systems reviewed and are negative.   Allergies  Hydrocodone-acetaminophen; Oxycodone-acetaminophen; and Tetracycline  Home Medications   Prior to Admission medications   Medication Sig Start Date End Date Taking? Authorizing Provider  albuterol (PROAIR HFA) 108 (90 BASE) MCG/ACT inhaler Inhale 2 puffs into the lungs every 6 (six) hours as needed for wheezing or shortness of breath. 10/06/13  Yes Tobey Grim, MD  cyclobenzaprine (FLEXERIL) 10 MG tablet Take 1 tablet (10 mg total) by mouth 2 (two) times daily. 12/22/13  Yes Barbaraann Barthel, MD  EPINEPHrine (EPI-PEN) 0.3 mg/0.3 mL DEVI Inject 0.3 mLs (0.3 mg total) into the muscle once. 01/03/12  Yes Tobey Grim, MD   fexofenadine (ALLEGRA) 180 MG tablet Take 180 mg by mouth daily.     Yes Historical Provider, MD  fluticasone Aleda Grana) 50 MCG/ACT nasal spray Place 2 sprays in each nostril daily 01/01/13  Yes Nestor Ramp, MD  hydrochlorothiazide (HYDRODIURIL) 25 MG tablet Take 1 tablet (25 mg total) by mouth daily. 09/25/13  Yes Tobey Grim, MD  metFORMIN (GLUCOPHAGE) 850 MG tablet Take 1 tablet (850 mg total) by mouth 2 (two) times daily with a meal. 04/01/14  Yes Tobey Grim, MD  omeprazole (PRILOSEC) 20 MG capsule Take 1 capsule (20 mg total) by mouth daily. 10/16/13  Yes Tobey Grim, MD  simvastatin (ZOCOR) 40 MG tablet TAKE ONE TABLET BY MOUTH AT BEDTIME 01/14/14  Yes Tobey Grim, MD  traMADol (ULTRAM) 50 MG tablet TAKE ONE TABLET BY MOUTH TWICE DAILY AS NEEDED FOR PAIN 01/14/14  Yes Tobey Grim, MD  aspirin (ASPIR-81) 81 MG EC tablet Take 81 mg by mouth daily.      Historical Provider, MD  augmented betamethasone dipropionate (DIPROLENE AF) 0.05 % cream Apply topically 2 (two) times daily. 04/04/14   Graylon Good, PA-C  levothyroxine (SYNTHROID, LEVOTHROID) 112 MCG tablet Take 1 tablet (112 mcg total) by mouth daily. 03/11/13 03/11/14  Tobey Grim, MD  predniSONE (DELTASONE) 50 MG tablet Take daily w/ breakfast 01/18/14   Ozella Rocks, MD  BP 125/80  Pulse 74  Temp(Src) 98.1 F (36.7 C) (Oral)  Resp 18  SpO2 98% Physical Exam  Nursing note and vitals reviewed. Constitutional: She is oriented to person, place, and time. Vital signs are normal. She appears well-developed and well-nourished. No distress.  HENT:  Head: Normocephalic and atraumatic.  Pulmonary/Chest: Effort normal. No respiratory distress. She has no wheezes.  Neurological: She is alert and oriented to person, place, and time. She has normal strength. Coordination normal.  Skin: Skin is warm and dry. Rash noted. Rash is papular (1-2 mm erythematous papules on the hands, wrists, and forearms). She is not  diaphoretic.  Psychiatric: She has a normal mood and affect. Judgment normal.    ED Course  Procedures (including critical care time) Labs Review Labs Reviewed - No data to display  Imaging Review No results found.   MDM   1. Contact dermatitis    Partially treated contact dermatitis. Will prescribe steroid cream. Followup if no improvement in a few days.  Meds ordered this encounter  Medications  . augmented betamethasone dipropionate (DIPROLENE AF) 0.05 % cream    Sig: Apply topically 2 (two) times daily.    Dispense:  50 g    Refill:  0    Order Specific Question:  Supervising Provider    Answer:  Lorenz Coaster, DAVID C [[id number]]       Graylon Good, PA-C 04/04/14 1828

## 2014-04-05 NOTE — ED Provider Notes (Signed)
Medical screening examination/treatment/procedure(s) were performed by non-physician practitioner and as supervising physician I was immediately available for consultation/collaboration.  Yacoub Diltz, M.D.  Sophira Rumler C Shana Younge, MD 04/05/14 0744 

## 2014-04-14 ENCOUNTER — Other Ambulatory Visit: Payer: Self-pay | Admitting: Family Medicine

## 2014-04-14 MED ORDER — METFORMIN HCL 850 MG PO TABS: 850.0000 mg | ORAL_TABLET | Freq: Two times a day (BID) | ORAL | Status: DC

## 2014-04-26 ENCOUNTER — Other Ambulatory Visit: Payer: Self-pay | Admitting: Family Medicine

## 2014-04-29 ENCOUNTER — Encounter: Payer: Self-pay | Admitting: Family Medicine

## 2014-04-29 ENCOUNTER — Ambulatory Visit (INDEPENDENT_AMBULATORY_CARE_PROVIDER_SITE_OTHER): Payer: Medicare Other | Admitting: Family Medicine

## 2014-04-29 VITALS — BP 139/106 | HR 97 | Temp 98.2°F | Wt 304.3 lb

## 2014-04-29 DIAGNOSIS — H6692 Otitis media, unspecified, left ear: Secondary | ICD-10-CM | POA: Insufficient documentation

## 2014-04-29 DIAGNOSIS — J019 Acute sinusitis, unspecified: Secondary | ICD-10-CM | POA: Insufficient documentation

## 2014-04-29 DIAGNOSIS — J069 Acute upper respiratory infection, unspecified: Secondary | ICD-10-CM

## 2014-04-29 DIAGNOSIS — H65192 Other acute nonsuppurative otitis media, left ear: Secondary | ICD-10-CM

## 2014-04-29 MED ORDER — AMOXICILLIN 500 MG PO CAPS: 500.0000 mg | ORAL_CAPSULE | Freq: Three times a day (TID) | ORAL | Status: DC

## 2014-04-29 MED ORDER — BENZONATATE 100 MG PO CAPS: 100.0000 mg | ORAL_CAPSULE | Freq: Three times a day (TID) | ORAL | Status: DC | PRN

## 2014-04-29 NOTE — Assessment & Plan Note (Signed)
Patient with mildly erythematous left TM; likely early otitis media in the setting of recent viral URI. Will treat with Amoxicillin.
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
## 2014-04-29 NOTE — Assessment & Plan Note (Signed)
Patient is known sick contact. Patient experiencing acute upper respiratory infection which is likely viral in nature. Supportive care. Tessalon for cough.

## 2014-04-29 NOTE — Progress Notes (Signed)
   Subjective:    Patient ID: Brianna Wheeler, female    DOB: 10/20/1955, 58 y.o.   MRN: [id number]  HPI 58 year old female presents for same day appointment with complaints of sore throat and cough.  1) Sore Throat & Cough  Patient reports her symptoms began on Saturday.  She's been expressing sore throat, nonproductive cough, runny nose.  Patient denies any fever but reports chills.  No associated nausea, vomiting.  Patient has a known sick contact as her son has been sick with similar symptoms recently.   Review of Systems Per HPI    Objective:   Physical Exam Filed Vitals:   04/29/14 1111  BP: 139/106  Pulse: 97  Temp: 98.2 F (36.8 C)   Exam: General: Elderly female, appears older than stated age, sitting up on exam table in no acute distress. HEENT: NCAT. Left TM mildly erythematous; right TM normal. Oropharynx mildly erythematous no exudate noted. Cardiovascular: RRR. No murmurs, rubs, or gallops. Respiratory: CTAB. No rales, rhonchi, or wheeze.    Assessment & Plan:  See Problem List

## 2014-05-02 ENCOUNTER — Telehealth: Payer: Self-pay | Admitting: Family Medicine

## 2014-05-02 NOTE — Telephone Encounter (Signed)
[facility] After Hours [facility]  Brianna Wheeler is a 58 yo female with recent URI and Otitis media. Patient with cough symptoms for 2 weeks. Has been taking antibiotics for 4 days. Prescribed amoxicillin and Tessalon which have not helped. Associated nasal congestion. Takes flonase and allegra every day without improvement of symptoms. Both her and son are sick.   Patient without fevers, nausea, vomiting. Main complaint is symptoms intolerance. Instructed patient to call office in the morning to make a same day appointment.

## 2014-05-03 ENCOUNTER — Other Ambulatory Visit: Payer: Self-pay | Admitting: Family Medicine

## 2014-05-04 ENCOUNTER — Telehealth: Payer: Self-pay | Admitting: Family Medicine

## 2014-05-04 NOTE — Telephone Encounter (Signed)
Pt called and needs a refill on her Tramadol left up front for pickup and please call when ready. jw

## 2014-05-05 MED ORDER — TRAMADOL HCL 50 MG PO TABS: ORAL_TABLET | ORAL | Status: DC

## 2014-05-05 NOTE — Telephone Encounter (Signed)
Completed and placed in to be faxed box.  

## 2014-05-06 ENCOUNTER — Ambulatory Visit (INDEPENDENT_AMBULATORY_CARE_PROVIDER_SITE_OTHER): Payer: Medicare Other | Admitting: Family Medicine

## 2014-05-06 ENCOUNTER — Encounter: Payer: Self-pay | Admitting: Family Medicine

## 2014-05-06 VITALS — BP 119/74 | HR 71 | Temp 98.7°F | Ht 71.0 in | Wt 306.6 lb

## 2014-05-06 DIAGNOSIS — H65192 Other acute nonsuppurative otitis media, left ear: Secondary | ICD-10-CM

## 2014-05-06 DIAGNOSIS — J0100 Acute maxillary sinusitis, unspecified: Secondary | ICD-10-CM

## 2014-05-06 MED ORDER — LEVOFLOXACIN 500 MG PO TABS: 500.0000 mg | ORAL_TABLET | Freq: Every day | ORAL | Status: DC

## 2014-05-06 MED ORDER — BENZONATATE 100 MG PO CAPS: 100.0000 mg | ORAL_CAPSULE | Freq: Three times a day (TID) | ORAL | Status: DC | PRN

## 2014-05-06 NOTE — Patient Instructions (Signed)
Nice to see you. We are going to give you a different antibiotic for sinus infection.  If you do not improve in the next 3 days please return for follow-up. If you develop fever or shortness of breath please return.

## 2014-05-06 NOTE — Progress Notes (Signed)
Patient ID: Brianna Wheeler, female   DOB: 11/30/1955, 58 y.o.   MRN: [id number]  Brianna AlarEric Vesta Wheeland, MD Phone: [phone]  Brianna Wheeler is a 58 y.o. female who presents today for same day appointment.   UPPER RESPIRATORY INFECTION   Seen one week ago and diagnosed with AOM. Treated with amoxacillin.  Onset: 1.5 weeks days Course: slight worsening Better with: nothing Meds tried: amoxacillin for OM, allegra, flonase, tessalon perles Sick contacts: son Nasal discharge (color,laterality): clear, white mucus, both Ear discomfort, worsening nasal congestion, cough  Sinusitis Risk Factors  Fever: no  Headache/face pain: yes  Tooth pain: no   Allergy Risk Factors:  Sneezing: yes  Itchy scratchy throat: yes  Seasonal sx: no   Flu Risk Factors  Headache: no  Muscle aches: no  Severe fatigue: no   Red Flags  Stiff neck: no  Dyspnea: no  Rash: no  Swallowing difficulty: no    Patient is a nonsmoker.   ROS: Per HPI   Physical Exam Filed Vitals:   05/06/14 1046  BP: 119/74  Pulse: 71  Temp: 98.7 F (37.1 C)    Gen: Well NAD HEENT: PERRL,  MMM, bilateral TMs normal, mild OP erythema with no exudate, no cervical LAD Lungs: CTABL Nl WOB Heart: RRR no MRG Exts: Non edematous BL  LE, warm and well perfused.    Assessment/Plan: Please see individual problem list.   Brianna AlarEric Trenia Tennyson, MD [facility] PGY-3

## 2014-05-06 NOTE — Assessment & Plan Note (Addendum)
Presents with worsening sinus congestion despite treatment with amoxacillin for AOM. Will treat as failure of treatment now with likely bacterial sinusitis with 10 days of symptoms. Start levaquin 500 mg daily for 7 days. Tessalon for cough. Continue supportive care. F/u if not improving in 3 days.

## 2014-05-06 NOTE — Assessment & Plan Note (Signed)
Resolved on exam today. 

## 2014-05-11 ENCOUNTER — Other Ambulatory Visit (INDEPENDENT_AMBULATORY_CARE_PROVIDER_SITE_OTHER): Payer: Medicare Other

## 2014-05-11 ENCOUNTER — Other Ambulatory Visit: Payer: Self-pay | Admitting: Family Medicine

## 2014-05-11 DIAGNOSIS — E1110 Type 2 diabetes mellitus with ketoacidosis without coma: Secondary | ICD-10-CM

## 2014-05-11 DIAGNOSIS — E1310 Other specified diabetes mellitus with ketoacidosis without coma: Secondary | ICD-10-CM

## 2014-05-11 LAB — POCT GLYCOSYLATED HEMOGLOBIN (HGB A1C): Hemoglobin A1C: 6.5

## 2014-05-11 NOTE — Progress Notes (Signed)
A1c 6.5%

## 2014-05-23 ENCOUNTER — Other Ambulatory Visit: Payer: Self-pay | Admitting: Family Medicine

## 2014-05-31 ENCOUNTER — Other Ambulatory Visit: Payer: Medicare Other

## 2014-05-31 DIAGNOSIS — E039 Hypothyroidism, unspecified: Secondary | ICD-10-CM

## 2014-05-31 NOTE — Progress Notes (Signed)
tsh done today Brianna Wheeler 

## 2014-06-01 LAB — TSH: TSH: 1.049 u[IU]/mL (ref 0.350–4.500)

## 2014-06-09 ENCOUNTER — Telehealth: Payer: Self-pay | Admitting: Family Medicine

## 2014-06-09 NOTE — Telephone Encounter (Signed)
Left message to return call. Please tell patient her TSH was normal.Busick, Rodena Medinobert Lee

## 2014-06-09 NOTE — Telephone Encounter (Signed)
Pt called and wanted to know what her lab results were. jw

## 2014-06-10 NOTE — Telephone Encounter (Signed)
spoke with patient and informed her of below 

## 2014-06-21 ENCOUNTER — Telehealth: Payer: Self-pay | Admitting: Family Medicine

## 2014-06-21 NOTE — Telephone Encounter (Signed)
Pt called because her right side of her back is ice cold, Left side very hot. She wants to know what she should do. jw

## 2014-06-24 ENCOUNTER — Other Ambulatory Visit: Payer: Self-pay | Admitting: Family Medicine

## 2014-07-09 ENCOUNTER — Ambulatory Visit (INDEPENDENT_AMBULATORY_CARE_PROVIDER_SITE_OTHER): Payer: Medicare Other | Admitting: Family Medicine

## 2014-07-09 VITALS — BP 119/69 | HR 74 | Temp 98.4°F | Ht 71.0 in | Wt 298.1 lb

## 2014-07-09 DIAGNOSIS — F329 Major depressive disorder, single episode, unspecified: Secondary | ICD-10-CM

## 2014-07-09 DIAGNOSIS — R5383 Other fatigue: Secondary | ICD-10-CM

## 2014-07-09 DIAGNOSIS — F32A Depression, unspecified: Secondary | ICD-10-CM

## 2014-07-09 MED ORDER — ZOLPIDEM TARTRATE 5 MG PO TABS: 2.5000 mg | ORAL_TABLET | Freq: Every evening | ORAL | Status: DC | PRN

## 2014-07-09 NOTE — Patient Instructions (Addendum)
Make sure you and Brianna Wheeler turn off all screens about 30 - 60 minutes before bed.  Take 1/2 pill of Ambien about 10 - 15 minutes before sleep.  Talk with Brianna Wheeler about nights to stay up (no more than 2) and nights you both need to go to bed.    For your nose, use nasal saline to help open it.    It was good to see you today.  Feel better.  Have a good Christmas!  ---------------------  For [name]:  Use prune juice daily or milk of magnesia when needed.   He needs more water (8 cups a day) plus vegetables and fiber to go to the bathroom daily.  Stop the antibiotics.

## 2014-07-10 DIAGNOSIS — R5383 Other fatigue: Secondary | ICD-10-CM | POA: Insufficient documentation

## 2014-07-10 NOTE — Assessment & Plan Note (Signed)
Due to lack of sleep and focus on relationship with son. Discussed good sleep hygiene as well as setting boundaries with her son.   Complicating factor is that although he is >60 years old, he has moderate cognitive delays. Trial of low-dose (2.5 mg) Ambien to help her set a sleep schedule.   FU with me in 2 weeks to assess for improvement.

## 2014-07-10 NOTE — Progress Notes (Signed)
Subjective:    Brianna Wheeler is a 58 y.o. female [facility]:  1.  Exhaustion:  Patient originally presented with her son earlier this week.  She looked and admitted to being very tired on that visit.  Denied any depression.  Asked her to return by herself.  Today, admits that her son's health problems are causing her to worry.  He stays up "playing on the computer" until 2-3 AM most nights.  Some night she goes in and cuts it all off to make him fall asleep, but this leads to arguments.  She is getting 2-3 hours of sleep a night this past week.  She watches TV until 11 PM and then tries to fall asleep immediately after that.  Has racing thoughts which keep her awake as well as general inability to fall asleep.   Decreased appetite since this started.  No abdominal pain.     ROS as above per HPI, otherwise neg.    The following portions of the patient's history were reviewed and updated as appropriate: allergies, current medications, past medical history, family and social history, and problem list. Patient is a nonsmoker.    PMH reviewed.  Past Medical History  Diagnosis Date  . Hypertension   . Hyperlipidemia   . Diabetes mellitus    Past Surgical History  Procedure Laterality Date  . Tubal ligation      Medications reviewed. Current Outpatient Prescriptions  Medication Sig Dispense Refill  . albuterol (PROAIR HFA) 108 (90 BASE) MCG/ACT inhaler Inhale 2 puffs into the lungs every 6 (six) hours as needed for wheezing or shortness of breath. 8.5 g 1  . aspirin (ASPIR-81) 81 MG EC tablet Take 81 mg by mouth daily.      Marland Kitchen. augmented betamethasone dipropionate (DIPROLENE AF) 0.05 % cream Apply topically 2 (two) times daily. 50 g 0  . benzonatate (TESSALON) 100 MG capsule Take 1 capsule (100 mg total) by mouth 3 (three) times daily as needed for cough. 30 capsule 0  . cyclobenzaprine (FLEXERIL) 10 MG tablet Take 1 tablet (10 mg total) by mouth 2 (two) times daily. 60  tablet 0  . EPINEPHrine (EPI-PEN) 0.3 mg/0.3 mL DEVI Inject 0.3 mLs (0.3 mg total) into the muscle once. 2 Device 0  . fexofenadine (ALLEGRA) 180 MG tablet Take 180 mg by mouth daily.      . fluticasone (FLONASE) 50 MCG/ACT nasal spray Place 2 sprays in each nostril daily 32 g 12  . hydrochlorothiazide (HYDRODIURIL) 25 MG tablet Take 1 tablet (25 mg total) by mouth daily. 90 tablet 1  . levofloxacin (LEVAQUIN) 500 MG tablet Take 1 tablet (500 mg total) by mouth daily. 7 tablet 0  . levothyroxine (SYNTHROID, LEVOTHROID) 112 MCG tablet Take 1 tablet (112 mcg total) by mouth daily. 30 tablet 6  . metFORMIN (GLUCOPHAGE) 850 MG tablet Take 1 tablet (850 mg total) by mouth 2 (two) times daily with a meal. 90 tablet 2  . omeprazole (PRILOSEC) 20 MG capsule Take 1 capsule (20 mg total) by mouth daily. 30 capsule 3  . predniSONE (DELTASONE) 50 MG tablet Take daily w/ breakfast 5 tablet 0  . simvastatin (ZOCOR) 40 MG tablet TAKE ONE TABLET BY MOUTH AT BEDTIME 90 tablet 2  . traMADol (ULTRAM) 50 MG tablet TAKE ONE TABLET BY MOUTH TWICE DAILY AS NEEDED FOR PAIN 60 tablet 2  . zolpidem (AMBIEN) 5 MG tablet Take 0.5 tablets (2.5 mg total) by mouth at bedtime as needed  for sleep. 15 tablet 1   No current facility-administered medications for this visit.     Objective:   Physical Exam BP 119/69 mmHg  Pulse 74  Temp(Src) 98.4 F (36.9 C) (Oral)  Ht 5\' 11"  (1.803 m)  Wt 298 lb 1.6 oz (135.217 kg)  BMI 41.59 kg/m2 Gen:  Alert, cooperative patient who appears stated age in no acute distress.  Vital signs reviewed. HEENT: EOMI,  MMM Cardiac:  Regular rate and rhythm without murmur auscultated.  Good S1/S2. Pulm:  Clear to auscultation bilaterally with good air movement.  No wheezes or rales noted.   Abd:  Soft/nondistended/nontender.  Good bowel sounds throughout all four quadrants.  No masses noted.  Exts: Non edematous BL  LE, warm and well perfused.   No results found for this or any previous visit  (from the past 72 hour(s)).

## 2014-07-10 NOTE — Assessment & Plan Note (Signed)
Doesn't admit to this, but likely worsened due to exhaustion. FU in 2 weeks.  She has lost 6 lbs since last visit.  Encouraged her to eat but think that sleep regulation with help with this.

## 2014-07-29 ENCOUNTER — Other Ambulatory Visit: Payer: Self-pay | Admitting: Family Medicine

## 2014-08-05 ENCOUNTER — Telehealth: Payer: Self-pay | Admitting: Family Medicine

## 2014-08-05 NOTE — Telephone Encounter (Signed)
Pt is having headaches when taking metformin, wants to know what she can do?

## 2014-08-10 ENCOUNTER — Other Ambulatory Visit: Payer: Self-pay | Admitting: Family Medicine

## 2014-09-10 ENCOUNTER — Other Ambulatory Visit: Payer: Self-pay | Admitting: Family Medicine

## 2014-09-10 MED ORDER — FLUTICASONE PROPIONATE 50 MCG/ACT NA SUSP: NASAL | Status: DC

## 2014-09-10 NOTE — Telephone Encounter (Signed)
Needs refill on flonase.  

## 2014-09-16 ENCOUNTER — Telehealth: Payer: Self-pay | Admitting: Family Medicine

## 2014-09-16 NOTE — Telephone Encounter (Signed)
Pt called again
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
## 2014-09-17 NOTE — Telephone Encounter (Signed)
The medicine should NOT make her head hurt worse.  It should make it better.  If it's worse, please let me know.

## 2014-09-17 NOTE — Telephone Encounter (Signed)
Patient called back and relayed message. She states that the medication has helped some and the pain is easing off, but will let us know if it gets worse.

## 2014-09-17 NOTE — Telephone Encounter (Signed)
LVM for patient to call back. ?

## 2014-09-22 ENCOUNTER — Encounter: Payer: Self-pay | Admitting: Family Medicine

## 2014-09-22 ENCOUNTER — Ambulatory Visit (INDEPENDENT_AMBULATORY_CARE_PROVIDER_SITE_OTHER): Payer: Medicare Other | Admitting: Family Medicine

## 2014-09-22 VITALS — BP 138/78 | HR 79 | Temp 98.3°F | Wt 305.0 lb

## 2014-09-22 DIAGNOSIS — B029 Zoster without complications: Secondary | ICD-10-CM | POA: Insufficient documentation

## 2014-09-22 MED ORDER — VALACYCLOVIR HCL 1 G PO TABS: 1000.0000 mg | ORAL_TABLET | Freq: Three times a day (TID) | ORAL | Status: AC

## 2014-09-22 MED ORDER — GABAPENTIN 100 MG PO CAPS: 100.0000 mg | ORAL_CAPSULE | Freq: Three times a day (TID) | ORAL | Status: DC

## 2014-09-22 NOTE — Assessment & Plan Note (Signed)
Most likely shingles per exam findings of vesicular rash with erythematous base extending in a dermatomal pattern, around T7 or T8 on right. Very painful. Present about 2-3 days. -Started Valtrex 1000 mg 3 times a day 7 days. -Gabapentin when necessary pain. Start with 100 mg daily, can increase up to 300 mg daily within one week. -Tramadol when necessary pain which patient has at home. -Rash reviewed by Dr. Gwendolyn GrantWalden who is precepting today.

## 2014-09-22 NOTE — Progress Notes (Signed)
I was preceptor the day of this visit.   

## 2014-09-22 NOTE — Patient Instructions (Signed)
This looks like shingles. Take Valtrex 3 times daily for 7 days. Take gabapentin starting with 1 tablet daily, and you can increase up to 3 tablets daily. This may cause some drowsiness. You can use tramadol as needed for pain.  Shingles Shingles is caused by the same virus that causes chickenpox. The first feelings may be pain or tingling. A rash will follow in a couple days. The rash may occur on any area of the body. Long-lasting pain is more likely in an elderly person. It can last months to years. There are medicines that can help prevent pain if you start taking them early. HOME CARE   Take cool baths or place cool cloths on the rash as told by your doctor.  Take medicine only as told by your doctor.  Rest as told by your doctor.  Keep your rash clean with mild soap and cool water or as told by your doctor.  Do not scratch your rash. You may use calamine lotion to relieve itchy skin as told by your doctor.  Keep your rash covered with a loose bandage (dressing).  Avoid touching:  Babies.  Pregnant women.  Children with inflamed skin (eczema).  People who have gotten organ transplants.  People with chronic illnesses, such as leukemia or AIDS.  Wear loose-fitting clothing.  If the rash is on the face, you may need to see a specialist. Keep all appointments. Shingles must be kept away from the eyes, if possible.  Keep all follow-up visits as told by your doctor. GET HELP RIGHT AWAY IF:   You have any pain on the face or eye.  You lose feeling on one side of your face.  You have ear pain or ringing in your ear.  You cannot taste as well.  Your medicines do not help the pain.  Your redness or puffiness (swelling) spreads.  You feel like you are getting worse.  You have a fever. MAKE SURE YOU:   Understand these instructions.  Will watch your condition.  Will get help right away if you are not doing well or get worse. Document Released: 12/26/2007  Document Revised: 11/23/2013 Document Reviewed: 12/26/2007 [facility] Patient Information 2015 [place]. This information is not intended to replace advice given to you by your health care provider. Make sure you discuss any questions you have with your health care provider.

## 2014-09-22 NOTE — Progress Notes (Signed)
Patient ID: Brianna MannersBarbara F Inscore, female   DOB: 07/20/1956, 59 y.o.   MRN: [id number] Subjective:   CC: Rash  HPI:   Patient presents to same day clinic with a right sided rash on her back that has been present for 2 days. She thinks this could be due to buying a new shirt or working under the house and being exposed to insulation. She has noticed a red area on her right mid back extending to her right side. This is very painful and burns. She tried topical Benadryl which did not help. She has never had a similar rash before. She denies fevers, chills, or itching. She denies any recent medication changes. She has not been exposed to anyone else with a similar rash.  Review of Systems - Per HPI.   Past medical history: She is unsure she if she ever had chickenpox.    Objective:  Physical Exam BP 138/78 mmHg  Pulse 79  Temp(Src) 98.3 F (36.8 C) (Oral)  Wt 305 lb (138.347 kg) GEN: NAD Skin: Linear vesicular rash with erythematous base about 1.5 inches thick extending from just lateral to mid thoracic spine all the way to flank on right side. Very tender.    Assessment:     Brianna MannersBarbara F Jinkins is a 59 y.o. female here for rash.    Plan:     # See problem list and after visit summary for problem-specific plans.    Follow-up: Follow up PRN for symptoms not improving.   Leona SingletonMaria T Ellyce Lafevers, MD [facility]

## 2014-09-26 ENCOUNTER — Other Ambulatory Visit: Payer: Self-pay | Admitting: Family Medicine

## 2014-09-27 ENCOUNTER — Telehealth: Payer: Self-pay | Admitting: Family Medicine

## 2014-09-27 NOTE — Telephone Encounter (Signed)
Pt called and needs some advice and new medication. She has shingles and will be out of her Valacyclovir are we going to send in a refill or does she need to come back in. She is also taking Gabapentin and still has some of that left. She would like to get the shingles vaccination when she is over this. The other question is does she need to make a follow up appointment. Please call patient to discuss. jw

## 2014-09-27 NOTE — Telephone Encounter (Signed)
Pt would like to speak with a nurse re: her medications she was prescribed for shingles, needs clarification

## 2014-09-28 NOTE — Telephone Encounter (Signed)
She needs to make a FU appointment to see me or someone else next week.  Once she's done with the Valcyclovir, she needs to stop this, it's only for 5 days or so.  Continue the Gabapentin.  See a clinician next week (or sooner if no better).  We'll get her the shot once she's completely over all of this.

## 2014-09-28 NOTE — Telephone Encounter (Signed)
Spoke with patient and informed her of below while she was in clinic with her son today

## 2014-10-06 ENCOUNTER — Encounter: Payer: Self-pay | Admitting: Family Medicine

## 2014-10-06 ENCOUNTER — Ambulatory Visit (INDEPENDENT_AMBULATORY_CARE_PROVIDER_SITE_OTHER): Payer: Medicare Other | Admitting: Family Medicine

## 2014-10-06 VITALS — BP 135/83 | HR 79 | Temp 98.3°F | Ht 71.0 in | Wt 305.0 lb

## 2014-10-06 DIAGNOSIS — B029 Zoster without complications: Secondary | ICD-10-CM | POA: Diagnosis not present

## 2014-10-06 DIAGNOSIS — K449 Diaphragmatic hernia without obstruction or gangrene: Secondary | ICD-10-CM | POA: Diagnosis not present

## 2014-10-06 MED ORDER — ZOSTER VACCINE LIVE 19400 UNT/0.65ML ~~LOC~~ SOLR: 0.6500 mL | Freq: Once | SUBCUTANEOUS | Status: DC

## 2014-10-06 MED ORDER — OMEPRAZOLE 20 MG PO CPDR: 20.0000 mg | DELAYED_RELEASE_CAPSULE | Freq: Every day | ORAL | Status: DC

## 2014-10-06 MED ORDER — GABAPENTIN 100 MG PO CAPS: 100.0000 mg | ORAL_CAPSULE | Freq: Three times a day (TID) | ORAL | Status: DC

## 2014-10-06 NOTE — Assessment & Plan Note (Signed)
GERD symptoms worsening with being out of omeprazole and stress. - Discussed working on stress level. - Represcribed omeprazole 1 month. Follow-up with Dr. Gwendolyn GrantWalden.

## 2014-10-06 NOTE — Assessment & Plan Note (Signed)
Shingles is improving. Still painful and gabapentin is helping but not sufficiently. Completed 1 week course of Valtrex. -Continue gabapentin, increase to 3 times a day and can increase dosing slowly to 4-5 tablets per day, limiting if drowsiness. Represcribed. -Cover while working outdoors to prevent chafing. -Follow-up when necessary. -Zoster vaccine predicted and told patient to use this once rash is completely cleared. Discussed that it may not prevent future flareups.

## 2014-10-06 NOTE — Patient Instructions (Addendum)
Your shingles rash is getting better. Feel free to cover it while you're outside working so it does not chafe. Continue to watch it, and if the scabbing starts to look infected, follow-up with us. You can take 1 more gabapentin at noon time and increase to 2 tablets at your worst time of the day. You are not close to the maximum, but the key to preventing drowsiness is increasing slowly. Follow-up as needed I have prescribed the shingles vaccine for you which you can get when this is completely clear, but there is no evidence that it will prevent future flareups. I will refill your omeprazole for one month. Discuss with Dr. Gwendolyn GrantWalden.

## 2014-10-06 NOTE — Progress Notes (Signed)
Subjective:   CC: Follow up shingles  HPI:   RASH Patient reports that rash is improving and pain is much improved, though there is an area on her mid back that is still very painful especially when clothing touches it. She tries to be very active outdoors and this is when it hurts worse. Was put on 7 days valtrex. She is taking gabapentin 100 mg along with tramadol in the morning and gabapentin 100 mg at night which are helping but not enough. Gabapentin is not making her drowsy. She denies fevers or purulence.   GERD She also requests omeprazole refill. She is on this chronically for GERD. She has had worsened GERD pain since running out of this. She does report lots of stress in her life.  Review of Systems - Per HPI.  PMH: Asthma, depression, type 2 diabetes, peripheral diabetic neuropathy, hyperlipidemia, hypertension, hypothyroidism, history of arthritis, shingles    Objective:  Physical Exam BP 135/83 mmHg  Pulse 79  Temp(Src) 98.3 F (36.8 C) (Oral)  Ht 5\' 11"  (1.803 m)  Wt 305 lb (138.347 kg)  BMI 42.56 kg/m2 GEN: NAD Skin: Mid back with about 5 x 4 cm area that is still erythematous and blistered, although blisters have now unroofed and are scabbing over, no exudate. Mild surrounding erythema. Erythematous drying blisters extended in a band across right side of thoracic back to flank. HEENT: Left iris hypopigmented compared to right    Assessment:     Brianna Wheeler is a 59 y.o. female here for F/u shingles.    Plan:     # See problem list and after visit summary for problem-specific plans.  # Health Maintenance: Not discussed.  Follow-up: Follow up PRN for worsening pain from shingles.     Leona SingletonMaria T Alailah Safley, MD [facility]

## 2014-10-20 ENCOUNTER — Other Ambulatory Visit: Payer: Self-pay

## 2014-10-20 DIAGNOSIS — Z1231 Encounter for screening mammogram for malignant neoplasm of breast: Secondary | ICD-10-CM

## 2014-10-21 ENCOUNTER — Other Ambulatory Visit: Payer: Self-pay

## 2014-10-21 DIAGNOSIS — Z1231 Encounter for screening mammogram for malignant neoplasm of breast: Secondary | ICD-10-CM

## 2014-11-02 ENCOUNTER — Other Ambulatory Visit: Payer: Self-pay | Admitting: Family Medicine

## 2014-11-02 NOTE — Telephone Encounter (Signed)
Pt called and needs a refill on her Prilosec. jw

## 2014-11-03 MED ORDER — OMEPRAZOLE 20 MG PO CPDR: 20.0000 mg | DELAYED_RELEASE_CAPSULE | Freq: Every day | ORAL | Status: DC

## 2014-11-04 ENCOUNTER — Ambulatory Visit
Admission: RE | Admit: 2014-11-04 | Discharge: 2014-11-04 | Disposition: A | Discharge status: Home / Self Care | Payer: Medicare Other | Source: Ambulatory Visit

## 2014-11-04 ENCOUNTER — Ambulatory Visit: Payer: Medicare Other

## 2014-11-04 DIAGNOSIS — Z1231 Encounter for screening mammogram for malignant neoplasm of breast: Secondary | ICD-10-CM

## 2014-11-05 NOTE — Telephone Encounter (Signed)
Spoke with patient and informed her that rx has been sent in by Dr. Gwendolyn GrantWalden

## 2014-11-16 ENCOUNTER — Encounter: Payer: Medicare Other | Admitting: Family Medicine

## 2014-12-15 ENCOUNTER — Telehealth: Payer: Self-pay | Admitting: Family Medicine

## 2014-12-15 NOTE — Telephone Encounter (Signed)
Pt stated she think she got a heat rash on back of knees. The area is very itchy.  Pt advised to use OTC hydrocortisone cream and if it does not help to call for an appt.  Pt stated understanding.  Will forward to PCP for further advise.  Clovis PuMartin, [place] L, RN

## 2014-12-15 NOTE — Telephone Encounter (Signed)
Left voice message for patient to call back regarding rash.  Clovis PuMartin, Pennye Beeghly L, RN

## 2014-12-15 NOTE — Telephone Encounter (Signed)
Patient is returning call, [name] was unavailable. Please contact the patient at the earliest convenience about her rash. Thank you, Brianna Wheeler, ASA

## 2014-12-15 NOTE — Telephone Encounter (Signed)
Agree with hydrocortisone.  If not better, should FU with us.

## 2014-12-15 NOTE — Telephone Encounter (Signed)
Has rash on back of her knees, itches and sweats. What can she put on it?

## 2014-12-27 ENCOUNTER — Other Ambulatory Visit: Payer: Self-pay | Admitting: Family Medicine

## 2014-12-28 ENCOUNTER — Telehealth: Payer: Self-pay | Admitting: Pharmacist

## 2014-12-28 ENCOUNTER — Telehealth: Payer: Self-pay | Admitting: Family Medicine

## 2014-12-28 NOTE — Telephone Encounter (Signed)
Attempted to call patient at home RE diabetes supplies.  Advised to bring them to the office.   I will be happy to recycle any unused supplies.

## 2014-12-28 NOTE — Telephone Encounter (Signed)
Brianna Wheeler is calling because her sister, who was a diabetic, recently passed away. Brianna Wheeler states that there are diabetic supplies, such as Lancet and other things like strips that have not been used. She was wondering if she could donate them here for our diabetic patients. You can reach her at her home number to retrieve more information or to give her an answer. Thank you, Dorothey BasemanSadie Reynolds, ASA

## 2014-12-29 ENCOUNTER — Other Ambulatory Visit: Payer: Self-pay | Admitting: Family Medicine

## 2014-12-31 ENCOUNTER — Ambulatory Visit (INDEPENDENT_AMBULATORY_CARE_PROVIDER_SITE_OTHER): Payer: Medicare Other | Admitting: Family Medicine

## 2014-12-31 ENCOUNTER — Encounter: Payer: Self-pay | Admitting: Family Medicine

## 2014-12-31 VITALS — BP 135/63 | HR 76 | Temp 97.6°F | Ht 71.0 in | Wt 295.0 lb

## 2014-12-31 DIAGNOSIS — M545 Low back pain, unspecified: Secondary | ICD-10-CM

## 2014-12-31 DIAGNOSIS — Z0000 Encounter for general adult medical examination without abnormal findings: Secondary | ICD-10-CM | POA: Diagnosis not present

## 2014-12-31 DIAGNOSIS — Z23 Encounter for immunization: Secondary | ICD-10-CM | POA: Diagnosis not present

## 2014-12-31 DIAGNOSIS — E119 Type 2 diabetes mellitus without complications: Secondary | ICD-10-CM

## 2014-12-31 DIAGNOSIS — B029 Zoster without complications: Secondary | ICD-10-CM

## 2014-12-31 DIAGNOSIS — M25572 Pain in left ankle and joints of left foot: Secondary | ICD-10-CM

## 2014-12-31 DIAGNOSIS — E785 Hyperlipidemia, unspecified: Secondary | ICD-10-CM

## 2014-12-31 DIAGNOSIS — E038 Other specified hypothyroidism: Secondary | ICD-10-CM | POA: Diagnosis not present

## 2014-12-31 DIAGNOSIS — I10 Essential (primary) hypertension: Secondary | ICD-10-CM

## 2014-12-31 LAB — CBC
HCT: 38.8 % (ref 36.0–46.0)
Hemoglobin: 12.5 g/dL (ref 12.0–15.0)
MCH: 25.6 pg — AB (ref 26.0–34.0)
MCHC: 32.2 g/dL (ref 30.0–36.0)
MCV: 79.3 fL (ref 78.0–100.0)
MPV: 10 fL (ref 8.6–12.4)
PLATELETS: 229 10*3/uL (ref 150–400)
RBC: 4.89 MIL/uL (ref 3.87–5.11)
RDW: 16.3 % — AB (ref 11.5–15.5)
WBC: 4 10*3/uL (ref 4.0–10.5)

## 2014-12-31 LAB — COMPREHENSIVE METABOLIC PANEL
ALBUMIN: 3.4 g/dL — AB (ref 3.5–5.2)
ALT: 43 U/L — ABNORMAL HIGH (ref 0–35)
AST: 75 U/L — ABNORMAL HIGH (ref 0–37)
Alkaline Phosphatase: 53 U/L (ref 39–117)
BILIRUBIN TOTAL: 1 mg/dL (ref 0.2–1.2)
BUN: 10 mg/dL (ref 6–23)
CALCIUM: 9.5 mg/dL (ref 8.4–10.5)
CO2: 26 meq/L (ref 19–32)
CREATININE: 0.84 mg/dL (ref 0.50–1.10)
Chloride: 100 mEq/L (ref 96–112)
Glucose, Bld: 128 mg/dL — ABNORMAL HIGH (ref 70–99)
Potassium: 3.2 mEq/L — ABNORMAL LOW (ref 3.5–5.3)
Sodium: 137 mEq/L (ref 135–145)
Total Protein: 7.4 g/dL (ref 6.0–8.3)

## 2014-12-31 LAB — TSH: TSH: 0.644 u[IU]/mL (ref 0.350–4.500)

## 2014-12-31 LAB — LIPID PANEL
CHOL/HDL RATIO: 3.9 ratio
Cholesterol: 153 mg/dL (ref 0–200)
HDL: 39 mg/dL — AB (ref >=46)
LDL Cholesterol: 96 mg/dL (ref 0–99)
Triglycerides: 90 mg/dL (ref <150)
VLDL: 18 mg/dL (ref 0–40)

## 2014-12-31 LAB — POCT GLYCOSYLATED HEMOGLOBIN (HGB A1C): HEMOGLOBIN A1C: 6.2

## 2014-12-31 MED ORDER — ZOSTER VACCINE LIVE 19400 UNT/0.65ML ~~LOC~~ SOLR: 0.6500 mL | Freq: Once | SUBCUTANEOUS | Status: DC

## 2014-12-31 NOTE — Assessment & Plan Note (Signed)
Controlled with diet.   Doesn't want to restart metformin.  Based on A1C, I think this is fine.   Eye exam is scheduled for August.  Foot exam today is good.  FU 3 months for A1C.

## 2014-12-31 NOTE — Assessment & Plan Note (Signed)
Remains at goal.  No change to HCTZ.
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
## 2014-12-31 NOTE — Assessment & Plan Note (Signed)
Stool cards today. Needs pap. She will defer until later in the year for this.

## 2014-12-31 NOTE — Patient Instructions (Addendum)
Tetanus shot today.  Checking bloodwork today.    We'll do the stool cards today.    Make sure you get your eye exam in August.    Compression socks on your Left foot will be helpful.    Make sure to get your shingles shot.    It was good to see you today!

## 2014-12-31 NOTE — Assessment & Plan Note (Signed)
Zostavax printed today for her to take to pharmacy.

## 2014-12-31 NOTE — Assessment & Plan Note (Signed)
Checking lipid panel today. 
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
## 2014-12-31 NOTE — Addendum Note (Signed)
Addended by: Garen Grams F on: 12/31/2014 11:39 AM   Modules accepted: Orders

## 2014-12-31 NOTE — Assessment & Plan Note (Signed)
No actual pain today in Left foot. More concerned about occasional ankle swelling.  None currently.  Compression stockings as she doesn't tolerate ASO bandage purchased at [facility] due to "itching."

## 2015-01-02 ENCOUNTER — Telehealth: Payer: Self-pay | Admitting: Family Medicine

## 2015-01-02 NOTE — Telephone Encounter (Signed)
[facility]  Patient received vaccine Friday. Since then injection site has become swollen and painful. She has noted a "knot" under the skin. Denied fever, erythema, drainage, n/v/d. Was told to take tylenol for pain--has been taking her home tramadol instead w/o any relief.   I explained to her this is likely a typical immune response to the vaccine. I informed her that tramadol likely wouldn't have much effect and that tylenol should help w/ the discomfort. I also informed her that use of the effected arm may help resolve these symptoms faster than if she "babied" it.   Patient thanked me and stated her understanding of this as well as her understanding that if symptoms did not improve by Monday or she develops erythema/drainage/fever/numbness/weakness of the arm then she needs to be seen at a nearby UC/ED.  Kathee Delton, MD,MS,  PGY1 01/02/2015 12:19 AM

## 2015-01-03 ENCOUNTER — Encounter: Payer: Self-pay | Admitting: *Deleted

## 2015-01-03 NOTE — Progress Notes (Signed)
   Pt in nurse clinic today for possible reaction to Tdap given on 12/31/14.  Pt stated the area was very red and sore Friday night and Saturday.  She called the after hours provider, who told her to follow up in clinic today.  Tamika, RN right arm where injection was given.  It was given more in subcutaneous area than the deltoid area.  Area was red, tender to touch and felt a hard knot on area.  Pt stated she was not told any directions on what to take for pain regarding the Tdap.  Pt stated she took Tylenol to help with the soreness.  Precept with Dr. Piedad Climes; she assessed area and informed patient that it was given more in the fat area than muscle.  The redness would subside in a few days.  Continue using Tylenol or Ibuprofen for the soreness and help with inflammation.  Also to use a cool compress.  Pt stated understanding.  Will forward to PCP.  Clovis Pu, RN

## 2015-01-04 ENCOUNTER — Encounter: Payer: Self-pay | Admitting: Family Medicine

## 2015-01-04 MED ORDER — POTASSIUM CHLORIDE CRYS ER 20 MEQ PO TBCR: 20.0000 meq | EXTENDED_RELEASE_TABLET | Freq: Every day | ORAL | Status: DC

## 2015-01-18 ENCOUNTER — Other Ambulatory Visit: Payer: Self-pay | Admitting: *Deleted

## 2015-01-18 NOTE — Telephone Encounter (Signed)
Pt stated she gets SOB due to the humidity outside and need refill on inhaler.  Clovis PuMartin, Tamika L, RN

## 2015-01-19 MED ORDER — ALBUTEROL SULFATE HFA 108 (90 BASE) MCG/ACT IN AERS
2.0000 | INHALATION_SPRAY | Freq: Four times a day (QID) | RESPIRATORY_TRACT | Status: DC | PRN
Start: 2015-01-19 — End: 2016-07-19

## 2015-01-25 ENCOUNTER — Other Ambulatory Visit: Payer: Self-pay | Admitting: Family Medicine

## 2015-02-16 ENCOUNTER — Encounter: Payer: Self-pay | Admitting: Family Medicine

## 2015-02-16 ENCOUNTER — Ambulatory Visit (INDEPENDENT_AMBULATORY_CARE_PROVIDER_SITE_OTHER): Payer: Medicare Other | Admitting: Family Medicine

## 2015-02-16 VITALS — BP 120/74 | HR 80 | Temp 98.2°F | Wt 298.0 lb

## 2015-02-16 DIAGNOSIS — R21 Rash and other nonspecific skin eruption: Secondary | ICD-10-CM | POA: Insufficient documentation

## 2015-02-16 MED ORDER — DOXYCYCLINE HYCLATE 100 MG PO TABS: 100.0000 mg | ORAL_TABLET | Freq: Two times a day (BID) | ORAL | Status: DC

## 2015-02-16 MED ORDER — PREDNISONE 20 MG PO TABS: 40.0000 mg | ORAL_TABLET | Freq: Every day | ORAL | Status: DC

## 2015-02-16 MED ORDER — HYDROXYZINE HCL 10 MG PO TABS: 10.0000 mg | ORAL_TABLET | Freq: Three times a day (TID) | ORAL | Status: DC | PRN

## 2015-02-16 NOTE — Patient Instructions (Signed)
I have prescribed you doxycycline to cover for certain bacterial infections given you recent bite. Doxycyline is in the family of tetracycline, however since you only experienced nausea with that I do not expect any significant reactions.  - If you develop difficulty breathing, facial swelling, or hives please seek immediate medical attention. I have also prescribed prednisone to help with the inflammation and itching. Hydroxyzine can be used to help with itching as needed, however it can make you very sleepy. If your rash is worsening, there is drainage from the rash, you have difficulty breathing, of you have a change in vision due to the rash, please seek care immediately. You can contact our office at any time and get a physyican on call to speak with .  If there is no improvement over the next week, please come back and see [place].

## 2015-02-16 NOTE — Progress Notes (Signed)
Patient ID: Brianna Wheeler, female   DOB: 09-25-1955, 59 y.o.   MRN: [id number]     Subjective: CC: rash HPI: Patient is a 59 y.o. female with presenting to clinic today for SDA for rash.   RASH Had rash for 1.5 days. Patient was bit by something on the evening of the 25th and noticed a large bump on her R arm. Didn't think it was a tick. The subsequent day noted erythematous bumps on the L side of her face. These are very pruritic in nature. Feels like the rash on her face may be spreading medially toward the R side of her face.  Medications tried: Benadryl, hydrocortisone cream without improvement. Similar rash in past: no  New medications or antibiotics: no  Tick, Insect or new pet exposure: Was outside watering plants and playing in garden when first bite occurred. Didn't see any insects or ticks. Wasn't wearing garden gloves but denies any rash on hands. Son who was outside with her has no rash. Recent travel: no New detergent or soap: no  Immunocompromised: no   Symptoms Itching: yes Pain over rash: no Feeling ill all over: no Fever: no Mouth sores: no Face or tongue swelling: no Stiff neck or headache: no Photophobia: no  Trouble breathing: no  Joint swelling or pain: no   Social History: never smoker   ROS: All other systems reviewed and are negative.  Past Medical History Patient Active Problem List   Diagnosis Date Noted  . Rash and nonspecific skin eruption 02/16/2015  . Shingles rash 09/22/2014  . Fatigue 07/10/2014  . Pain in joint, ankle and foot 01/15/2014  . Cataract 01/01/2013  . Preventative health care 02/11/2012  . Asthma 12/07/2011  . Depression 05/10/2011  . OSTEOARTHRITIS 04/07/2009  . DIABETIC PERIPHERAL NEUROPATHY 04/27/2008  . HYPOTHYROIDISM 03/03/2008  . Lumbago 10/03/2007  . Diabetes mellitus type II, controlled 05/08/2007  . HLD (hyperlipidemia) 09/19/2006  . HYPERTENSION, BENIGN SYSTEMIC 09/19/2006  . Diaphragmatic hernia  09/19/2006    Medications- reviewed and updated Current Outpatient Prescriptions  Medication Sig Dispense Refill  . albuterol (PROAIR HFA) 108 (90 BASE) MCG/ACT inhaler Inhale 2 puffs into the lungs every 6 (six) hours as needed for wheezing or shortness of breath. 8.5 g 1  . aspirin (ASPIR-81) 81 MG EC tablet Take 81 mg by mouth daily.      . cyclobenzaprine (FLEXERIL) 10 MG tablet Take 1 tablet (10 mg total) by mouth 2 (two) times daily. 60 tablet 0  . doxycycline (VIBRA-TABS) 100 MG tablet Take 1 tablet (100 mg total) by mouth 2 (two) times daily. 20 tablet 0  . EPINEPHrine (EPI-PEN) 0.3 mg/0.3 mL DEVI Inject 0.3 mLs (0.3 mg total) into the muscle once. 2 Device 0  . fexofenadine (ALLEGRA) 180 MG tablet Take 180 mg by mouth daily.      . fluticasone (FLONASE) 50 MCG/ACT nasal spray Place 2 sprays in each nostril daily 32 g 12  . hydrochlorothiazide (HYDRODIURIL) 25 MG tablet TAKE ONE TABLET BY MOUTH ONCE DAILY 90 tablet 2  . hydrOXYzine (ATARAX/VISTARIL) 10 MG tablet Take 1 tablet (10 mg total) by mouth 3 (three) times daily as needed for itching. 30 tablet 0  . levothyroxine (SYNTHROID, LEVOTHROID) 112 MCG tablet TAKE ONE TABLET BY MOUTH ONCE DAILY 30 tablet 3  . omeprazole (PRILOSEC) 20 MG capsule Take 1 capsule (20 mg total) by mouth daily. 30 capsule 6  . potassium chloride SA (K-DUR,KLOR-CON) 20 MEQ tablet Take 1 tablet (20 mEq total)  by mouth daily. 7 tablet 0  . predniSONE (DELTASONE) 20 MG tablet Take 2 tablets (40 mg total) by mouth daily with breakfast. 10 tablet 0  . simvastatin (ZOCOR) 40 MG tablet TAKE ONE TABLET BY MOUTH AT BEDTIME 90 tablet 2  . traMADol (ULTRAM) 50 MG tablet TAKE ONE TABLET BY MOUTH TWICE DAILY AS NEEDED FOR PAIN 60 tablet 1  . zoster vaccine live, PF, (ZOSTAVAX) 16109 UNT/0.65ML injection Inject 19,400 Units into the skin once. 1 each 0   No current facility-administered medications for this visit.    Objective: Office vital signs reviewed. BP 120/74  mmHg  Pulse 80  Temp(Src) 98.2 F (36.8 C) (Oral)  Wt 298 lb (135.172 kg)   Physical Examination:  General: Awake, alert, well- nourished, NAD ENMT:  Nasal turbinates moist. MMM, Oropharynx clear without erythema or tonsillar exudate/hypertrophy Eyes: Conjunctiva non-injected. PERRL.  Skin: large erythematous bumps on an erythematous base over the L side of her face. 1.5x1.5cm erythematous bump over the R forearm.  No drainage noted.  Neuro: no focal deficits. No nuchal rigidity.  Assessment/Plan: Rash and nonspecific skin eruption No new lotions or detergents. Had the rash on the face started after being outside and not the subsequent day, it appears like she had numerous (>10) mosquito bites on her face). Given the delayed response, this could be secondary to a poison ivy exposure, however it is odd that she does not have it on her hands and the rash does not appear as pustular as one would expect for poison ivy. No noted tick bites, fevers, or myalgias. - Prednisone  QD x 5 days - Hydroxyzine PRN itching - Given season and concerns for tick bite (unknown exposure) will start doxycycline  BID x 10d (pt with a h/o nausea with tetracycline, but no hives or anaphylaxis type reactions). 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     No orders of the defined types were placed in this encounter.    Meds ordered this encounter  Medications  . doxycycline (VIBRA-TABS) 100 MG tablet    Sig: Take 1 tablet (100 mg total) by mouth 2 (two) times daily.    Dispense:  20 tablet    Refill:  0  . predniSONE (DELTASONE) 20 MG tablet    Sig: Take 2 tablets (40 mg total) by mouth daily with breakfast.    Dispense:  10 tablet    Refill:  0  . hydrOXYzine (ATARAX/VISTARIL) 10 MG tablet    Sig: Take 1 tablet (10 mg total) by mouth 3  (three) times daily as needed for itching.    Dispense:  30 tablet    Refill:  0    Joanna Puff PGY-2, [facility]

## 2015-02-16 NOTE — Assessment & Plan Note (Signed)
No new lotions or detergents. Had the rash on the face started after being outside and not the subsequent day, it appears like she had numerous (>10) mosquito bites on her face). Given the delayed response, this could be secondary to a poison ivy exposure, however it is odd that she does not have it on her hands and the rash does not appear as pustular as one would expect for poison ivy. No noted tick bites, fevers, or myalgias. - Prednisone  QD x 5 days - Hydroxyzine PRN itching - Given season and concerns for tick bite (unknown exposure) will start doxycycline  BID x 10d (pt with a h/o nausea with tetracycline, but no hives or anaphylaxis type reactions). 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

## 2015-02-25 ENCOUNTER — Ambulatory Visit: Payer: Medicare Other | Admitting: Family Medicine

## 2015-03-21 ENCOUNTER — Ambulatory Visit (INDEPENDENT_AMBULATORY_CARE_PROVIDER_SITE_OTHER): Payer: Medicare Other | Admitting: Family Medicine

## 2015-03-21 ENCOUNTER — Encounter: Payer: Self-pay | Admitting: Family Medicine

## 2015-03-21 VITALS — BP 146/85 | HR 77 | Temp 98.4°F | Ht 71.0 in | Wt 302.1 lb

## 2015-03-21 DIAGNOSIS — M545 Low back pain, unspecified: Secondary | ICD-10-CM

## 2015-03-21 DIAGNOSIS — J302 Other seasonal allergic rhinitis: Secondary | ICD-10-CM

## 2015-03-21 NOTE — Progress Notes (Signed)
Subjective: Brianna Wheeler is a 59 y.o. female with a history of T2DM presenting for sinus issues and back pain.  Sinus pain starting the day after yard work on 8/25: feels like it's "all clogged up," causing headache bilateral forehead. Reports clear rhinorrhea starting that night, but none since, now with postnasal drip causing cough. Denies shortness of breath and chest pain. No fevers, tooth pain. +Sneezing, itchy throat, usually worse at baseline. No rashes, muscle aches, ear pain/fullness/change in hearing/vertigo, or trouble swallowing. Reports a history of these symptoms in the past diagnosed as sinus infection years back. She takes allegra daily and has been taking flonase and tylenol though these don't seem to help much.   She reports constant, moderate low back pain at baseline for which she takes tramadol BID and 1/2 flexeril at night, but the severity of pain is worse since slipping on cleaning fluid in her kitchen. No head trauma or LOC. No change in character of pain, including no radiation. Was able to walk immediately following. Denies any current bowel/bladder problems, night time awakenings secondary to pain, weakness in one or both legs  Patient is a nonsmoker.  Objective: Temp(Src) 98.4 F (36.9 C) (Oral)  Wt 302 lb 1.6 oz (137.032 kg) Gen: Obese, pleasant 59 y.o. female appearing older than stated age in no distress HEENT: Atraumatic, Sclerae clear, conjunctivae normal, Left pupil opacified with preserved direct and consensual reactivity, nares normal without discharge, turbinates hyperemic, some tenderness to palpation of frontal and maxillary sinuses, moist mucous membranes, posterior oropharynx clear, poor dentition CV: Regular rate, no murmur; trace pitting edema B/L ankles Pulm: Non-labored breathing ambient air; CTAB, no wheezes or crackles Back: Normal skin. Spine with normal alignment and no deformity. No tenderness to vertebral process palpation. Lumbar paraspinous  muscles tender with significant spasm. Range of motion is full at neck and lumbar sacral regions. Straight leg raise is negative. Neuro:  Sensation and motor function 5/5 bilateral lower extremities. Patellar and achilles DTR's 2+  Assessment/Plan: [name] is a 59 y.o. female here for allergic symptoms and lumbar muscle spasm.  See problem list for plan.

## 2015-03-21 NOTE — Assessment & Plan Note (Signed)
No signs or symptoms of bacterial sinusitis or asthma exacerbation. Continue supportive care with allegra, flonase, tylenol prn. Return to clinic if duration is 2 weeks, develops fever after initial improvement.

## 2015-03-21 NOTE — Assessment & Plan Note (Signed)
Worsened s/p slip and fall in kitchen. No red flags/indications for imaging. Recommended addition of 1/2 flexeril in AM given paraspinous spasm, prn NSAID for short duration, and no change in tramadol.

## 2015-03-21 NOTE — Patient Instructions (Signed)
Thank you for coming in today!  - You need to continue taking tramadol as directed and because your back muscles are spasming you should take flexeril 1/2 a pill in the morning as well as in the evening. Stay off your feet for a few days to let the muscles calm down.   - Your sinuses are due to allergies. Keep doing everything you're doing. If your sinuses are still bothering you in 2 weeks you need to come back because this may mean you have an infection.   Our clinic's number is [phone]. Feel free to call any time with questions or concerns. We will answer any questions after hours with our 24-hour emergency line at that number as well.   - Dr. Jarvis Newcomer

## 2015-03-29 ENCOUNTER — Encounter: Payer: Medicare Other | Admitting: Family Medicine

## 2015-03-30 ENCOUNTER — Other Ambulatory Visit: Payer: Self-pay | Admitting: Family Medicine

## 2015-03-31 ENCOUNTER — Telehealth: Payer: Self-pay | Admitting: Family Medicine

## 2015-03-31 ENCOUNTER — Ambulatory Visit (INDEPENDENT_AMBULATORY_CARE_PROVIDER_SITE_OTHER): Payer: Medicare Other | Admitting: *Deleted

## 2015-03-31 DIAGNOSIS — Z23 Encounter for immunization: Secondary | ICD-10-CM

## 2015-03-31 NOTE — Telephone Encounter (Signed)
Received after hours call. States that she has been having a headache with sinus congestion for the past 3-4 days. Has been feeling lightheaded. Tried allegra and flonase without significant relief. Is having some nasal drainage. Recommended that the patient try over the counter Sudafed. Instructed patient to return to clinic if symptoms are not improving within the next few days. Patient voiced understanding and had no further questions.  Katina Degree. Jimmey Ralph, MD [facility] Resident PGY-2 03/31/2015 8:59 PM

## 2015-04-05 ENCOUNTER — Ambulatory Visit: Payer: Medicare Other | Admitting: Family Medicine

## 2015-04-06 ENCOUNTER — Encounter: Payer: Self-pay | Admitting: Family Medicine

## 2015-04-06 ENCOUNTER — Ambulatory Visit (INDEPENDENT_AMBULATORY_CARE_PROVIDER_SITE_OTHER): Payer: Medicare Other | Admitting: Family Medicine

## 2015-04-06 ENCOUNTER — Telehealth: Payer: Self-pay | Admitting: Family Medicine

## 2015-04-06 VITALS — BP 137/78 | HR 78 | Temp 98.1°F | Ht 71.0 in | Wt 297.6 lb

## 2015-04-06 DIAGNOSIS — E119 Type 2 diabetes mellitus without complications: Secondary | ICD-10-CM

## 2015-04-06 DIAGNOSIS — F329 Major depressive disorder, single episode, unspecified: Secondary | ICD-10-CM

## 2015-04-06 DIAGNOSIS — J0101 Acute recurrent maxillary sinusitis: Secondary | ICD-10-CM | POA: Diagnosis not present

## 2015-04-06 DIAGNOSIS — F32A Depression, unspecified: Secondary | ICD-10-CM

## 2015-04-06 MED ORDER — SULFAMETHOXAZOLE-TRIMETHOPRIM 800-160 MG PO TABS: 1.0000 | ORAL_TABLET | Freq: Two times a day (BID) | ORAL | Status: DC

## 2015-04-06 MED ORDER — AMOXICILLIN 500 MG PO CAPS: 500.0000 mg | ORAL_CAPSULE | Freq: Three times a day (TID) | ORAL | Status: DC

## 2015-04-06 MED ORDER — PREDNISONE 20 MG PO TABS: 20.0000 mg | ORAL_TABLET | Freq: Every day | ORAL | Status: DC

## 2015-04-06 NOTE — Telephone Encounter (Signed)
I called in new antibiotic and updated her allergy list Please let her know THANKS! Brianna Wheeler

## 2015-04-06 NOTE — Patient Instructions (Signed)
I have called in an anti-biotic that you will take twice a day for 10 days. I've also called in a short course of prednisone. You will take one tablet a day for 5 days. It may knock  your blood sugar up for those few days so, be aware of that. Hopefully you'll be feeling much better in 2-3 days. Complete anabiotic course even  if you feel better.

## 2015-04-06 NOTE — Telephone Encounter (Signed)
Patient allergic to sulfur products.  Rx for the Sulfamethoxazole was denied pat due to this issue.   Need another antibiotic sent in.  Please include this as one of the medications patient have allergic rx to.

## 2015-04-06 NOTE — Telephone Encounter (Signed)
[facility] says they cannot give her the antibotic-bactrim-because there is a problem Please let pt know what to do

## 2015-04-06 NOTE — Telephone Encounter (Signed)
Did not see her, unclear what antibiotic was for.  Please forward to the provider who most recently saw her in clinic.

## 2015-04-06 NOTE — Telephone Encounter (Signed)
Pt called because [facility] will not fill her antibiotic because that particular one interacts with a medication she is currently on. Can we call in something different for the patient. She was seen by Dr. Jennette Kettle today. jw

## 2015-04-07 NOTE — Telephone Encounter (Signed)
LVM for pt to inform her of below. Lamonte Sakai, Pj Zehner D, [place]

## 2015-04-08 NOTE — Assessment & Plan Note (Signed)
Having a lot of problems with sleep, particularly initiating sleep since her husband died. She's also having a lot of anxiety. We talked about this at some length and I recommend she follow-up with her PCP. She has improved with him the next few weeks. Potentially she could benefit from counseling and/or medication.

## 2015-04-08 NOTE — Progress Notes (Signed)
   Subjective:    Patient ID: Brianna Wheeler, female    DOB: 11-13-1955, 59 y.o.   MRN: [id number]  HPI She is up or S4 infection for couple weeks now. In the last 4-5 days she's had fever, increasing facial pain, swimmy headedness and generalized fatigue. No cough.   Review of Systems Positive for fever and chills. Negative for cough, positive for headache and facial pain. Positive for fatigue.    Objective:   Physical Exam Vital signs are reviewed GEN.: Well-developed female no acute distress although she also feels rather poorly today. Next HEENT: TMs bilaterally have poor landmarks and are retracted. Is no sign of infection. Oropharynx is mildly erythematous. Days mucosa is swollen and boggy. Left maxillary sinuses very tender to palpation. Neck is without lymphadenopathy LUNGS: Clear to auscultation       Assessment & Plan:  Initially I gave her Bactrim but then we received a call from the pharmacy that she is allergic to sulfa. I checked her chart there is no indication of that but my nurse called her and she says she is allergic to sulfa so we have updated her chart, canceled the Bactrim prescription and called in amoxicillin. Given the acute nature of this I think I will give her 5 days of steroids.

## 2015-04-08 NOTE — Telephone Encounter (Signed)
Pt informed. Zimmerman Rumple, April D, CMA  

## 2015-04-08 NOTE — Assessment & Plan Note (Signed)
Since were treating her with sterile burst I discussed blood sugar control, red flags to watch out for.

## 2015-04-29 ENCOUNTER — Ambulatory Visit (INDEPENDENT_AMBULATORY_CARE_PROVIDER_SITE_OTHER): Payer: Medicare Other | Admitting: Family Medicine

## 2015-04-29 ENCOUNTER — Encounter: Payer: Self-pay | Admitting: Family Medicine

## 2015-04-29 VITALS — BP 129/66 | HR 72 | Temp 98.5°F | Ht 71.0 in | Wt 303.5 lb

## 2015-04-29 DIAGNOSIS — R7401 Elevation of levels of liver transaminase levels: Secondary | ICD-10-CM | POA: Insufficient documentation

## 2015-04-29 DIAGNOSIS — E785 Hyperlipidemia, unspecified: Secondary | ICD-10-CM

## 2015-04-29 DIAGNOSIS — H269 Unspecified cataract: Secondary | ICD-10-CM

## 2015-04-29 DIAGNOSIS — M545 Low back pain, unspecified: Secondary | ICD-10-CM

## 2015-04-29 DIAGNOSIS — R740 Nonspecific elevation of levels of transaminase and lactic acid dehydrogenase [LDH]: Secondary | ICD-10-CM

## 2015-04-29 DIAGNOSIS — I10 Essential (primary) hypertension: Secondary | ICD-10-CM

## 2015-04-29 DIAGNOSIS — E119 Type 2 diabetes mellitus without complications: Secondary | ICD-10-CM

## 2015-04-29 DIAGNOSIS — E039 Hypothyroidism, unspecified: Secondary | ICD-10-CM

## 2015-04-29 LAB — COMPREHENSIVE METABOLIC PANEL
ALBUMIN: 3.5 g/dL — AB (ref 3.6–5.1)
ALT: 33 U/L — ABNORMAL HIGH (ref 6–29)
AST: 51 U/L — AB (ref 10–35)
Alkaline Phosphatase: 48 U/L (ref 33–130)
BILIRUBIN TOTAL: 1 mg/dL (ref 0.2–1.2)
BUN: 8 mg/dL (ref 7–25)
CALCIUM: 9 mg/dL (ref 8.6–10.4)
CHLORIDE: 100 mmol/L (ref 98–110)
CO2: 28 mmol/L (ref 20–31)
CREATININE: 0.78 mg/dL (ref 0.50–0.99)
GLUCOSE: 120 mg/dL — AB (ref 65–99)
Potassium: 3.3 mmol/L — ABNORMAL LOW (ref 3.5–5.3)
SODIUM: 137 mmol/L (ref 135–146)
Total Protein: 7.5 g/dL (ref 6.1–8.1)

## 2015-04-29 LAB — POCT GLYCOSYLATED HEMOGLOBIN (HGB A1C): HEMOGLOBIN A1C: 6.6

## 2015-04-29 MED ORDER — OMEPRAZOLE 20 MG PO CPDR: 20.0000 mg | DELAYED_RELEASE_CAPSULE | Freq: Every day | ORAL | Status: DC

## 2015-04-29 MED ORDER — SIMVASTATIN 40 MG PO TABS: 40.0000 mg | ORAL_TABLET | Freq: Every day | ORAL | Status: DC

## 2015-04-29 MED ORDER — TRAMADOL HCL 50 MG PO TABS: 50.0000 mg | ORAL_TABLET | Freq: Two times a day (BID) | ORAL | Status: DC | PRN

## 2015-04-29 MED ORDER — LEVOTHYROXINE SODIUM 112 MCG PO TABS: 112.0000 ug | ORAL_TABLET | Freq: Every day | ORAL | Status: DC

## 2015-04-29 MED ORDER — KETOROLAC TROMETHAMINE 60 MG/2ML IM SOLN
60.0000 mg | Freq: Once | INTRAMUSCULAR | Status: AC
  Administered 2015-04-29: 60 mg via INTRAMUSCULAR

## 2015-04-29 MED ORDER — HYDROCHLOROTHIAZIDE 25 MG PO TABS: 25.0000 mg | ORAL_TABLET | Freq: Every day | ORAL | Status: DC

## 2015-04-29 NOTE — Assessment & Plan Note (Signed)
At goal currently.   Refilled statin today.  Mervyn Skeeters

## 2015-04-29 NOTE — Progress Notes (Signed)
Subjective:    Brianna Wheeler is a 59 y.o. female who presents to [facility] today for back pain:  1.  Back pain:  Did much more activity in past week than she has been previously.  Was walking for >6 hours [place] [employer] this past week.  Two days in a row.  Also long car ride to and from [place], [place].  Still walking ~30 minutes a day for activity.  She's currently taking both Tramadol and Tylenol together.  Not much relief with these.  Not sleeping well due to pain.  No bladder/bowel issues.  No saddle anesthesia.  Has chronic back pain but currently with sharp stabbing pain in lumbar area.  No radiation.  Bilateral.    2.  Diabetes:  Currently diet controlled -- though admits diet hasn't been as good as usual.  She previously was taking Metformin but hasn't had this for at least a year.  No adverse effects from medication.  No hypoglycemic events.  No paresthesia or peripheral nerve pain.    Lab Results  Component Value Date   HGBA1C 6.6 04/29/2015   3.   HLD:  Last lipid panel listed below.  Currently is on Statin.  Denies any myalgias, icterus, jaundice.  Tolerating medications well.   Lab Results  Component Value Date   CHOL 153 12/31/2014   CHOL 145 09/24/2013   CHOL 176 03/17/2009   Lab Results  Component Value Date   HDL 39* 12/31/2014   HDL 45 09/24/2013   HDL 42 03/17/2009   Lab Results  Component Value Date   LDLCALC 96 12/31/2014   LDLCALC 74 09/24/2013   LDLCALC 116* 03/17/2009   Lab Results  Component Value Date   TRIG 90 12/31/2014   TRIG 131 09/24/2013   TRIG 89 03/17/2009   Lab Results  Component Value Date   CHOLHDL 3.9 12/31/2014   CHOLHDL 3.2 09/24/2013   CHOLHDL 4.2 Ratio 03/17/2009     ROS as above per HPI, otherwise neg.  Pertinently, no chest pain, palpitations, SOB, Fever, Chills, Abd pain, N/V/D.   The following portions of the patient's history were reviewed and updated as appropriate: allergies, current medications, past medical history,  family and social history, and problem list. Patient is a nonsmoker.    PMH reviewed.  Past Medical History  Diagnosis Date  . Hypertension   . Hyperlipidemia   . Diabetes mellitus   . Left foot pain 2005    Chronic injury -- multiple broken bones and torn ligaments after accident in garage   Past Surgical History  Procedure Laterality Date  . Tubal ligation      Medications reviewed. Current Outpatient Prescriptions  Medication Sig Dispense Refill  . albuterol (PROAIR HFA) 108 (90 BASE) MCG/ACT inhaler Inhale 2 puffs into the lungs every 6 (six) hours as needed for wheezing or shortness of breath. 8.5 g 1  . amoxicillin (AMOXIL) 500 MG capsule Take 1 capsule (500 mg total) by mouth 3 (three) times daily. 30 capsule 0  . aspirin (ASPIR-81) 81 MG EC tablet Take 81 mg by mouth daily.      . cyclobenzaprine (FLEXERIL) 10 MG tablet Take 1 tablet (10 mg total) by mouth 2 (two) times daily. 60 tablet 0  . EPINEPHrine (EPI-PEN) 0.3 mg/0.3 mL DEVI Inject 0.3 mLs (0.3 mg total) into the muscle once. 2 Device 0  . fexofenadine (ALLEGRA) 180 MG tablet Take 180 mg by mouth daily.      . fluticasone (FLONASE)  50 MCG/ACT nasal spray Place 2 sprays in each nostril daily 32 g 12  . hydrochlorothiazide (HYDRODIURIL) 25 MG tablet TAKE ONE TABLET BY MOUTH ONCE DAILY 90 tablet 2  . levothyroxine (SYNTHROID, LEVOTHROID) 112 MCG tablet TAKE ONE TABLET BY MOUTH ONCE DAILY 30 tablet 3  . omeprazole (PRILOSEC) 20 MG capsule Take 1 capsule (20 mg total) by mouth daily. 30 capsule 6  . potassium chloride SA (K-DUR,KLOR-CON) 20 MEQ tablet Take 1 tablet (20 mEq total) by mouth daily. 7 tablet 0  . predniSONE (DELTASONE) 20 MG tablet Take 1 tablet (20 mg total) by mouth daily with breakfast. 5 tablet 1  . simvastatin (ZOCOR) 40 MG tablet TAKE ONE TABLET BY MOUTH AT BEDTIME 90 tablet 2  . traMADol (ULTRAM) 50 MG tablet TAKE ONE TABLET BY MOUTH TWICE DAILY AS NEEDED FOR PAIN 60 tablet 2  . zoster vaccine live, PF,  (ZOSTAVAX) 16109 UNT/0.65ML injection Inject 19,400 Units into the skin once. 1 each 0   No current facility-administered medications for this visit.     Objective:   Physical Exam BP 129/66 mmHg  Pulse 72  Temp(Src) 98.5 F (36.9 C) (Oral)  Ht  (1.803 m)  Wt 303 lb 8 oz (137.667 kg)  BMI 42.35 kg/m2 Gen:  Alert, cooperative patient who appears stated age in no acute distress.  Vital signs reviewed. HEENT: EOMI,  MMM Cardiac:  Regular rate and rhythm without murmur auscultated.   Pulm:  Clear to auscultation bilaterally with good air movement.    Back:  Normal skin, Spine with normal alignment and no deformity.  No tenderness to vertebral process palpation.  Paraspinous muscles are not tender and without spasm.   Range of motion is full at neck and lumbar sacral regions.  Straight leg raise is positive for back pain bilaterally.  Neuro:  Sensation and motor function 5/5 bilateral lower extremities.  Patellar and Achilles  DTR's +2 patellar BL.  He is able to walk on his heels and toes without difficulty.   Exts: Non edematous BL  LE, warm and well perfused.   Results for orders placed or performed in visit on 04/29/15 (from the past 72 hour(s))  POCT A1C     Status: None   Collection Time: 04/29/15  9:40 AM  Result Value Ref Range   Hemoglobin A1C 6.6

## 2015-04-29 NOTE — Addendum Note (Signed)
Addended byGwendolyn Grant, Zebediah Beezley H on: 04/29/2015 10:33 AM   Modules accepted: Orders, SmartSet

## 2015-04-29 NOTE — Assessment & Plan Note (Signed)
Rechecking LFTs today.   Checking hepatitis panel.

## 2015-04-29 NOTE — Patient Instructions (Signed)
Use ice as you need it.  As time goes on, switch to heat.  Keep taking the Tramadol.  Let me know if it gets worse.    Come back to see me in about a month or so.

## 2015-04-29 NOTE — Addendum Note (Signed)
Addended by: Lamonte Sakai, Ramez Arrona D on: 04/29/2015 02:18 PM   Modules accepted: Orders

## 2015-04-29 NOTE — Assessment & Plan Note (Signed)
Also at goal.  Refill for HCTZ.  Doing well.

## 2015-04-29 NOTE — Addendum Note (Signed)
Addended byGwendolyn Grant, Krishay Faro H on: 04/29/2015 10:34 AM   Modules accepted: Orders, SmartSet

## 2015-04-29 NOTE — Assessment & Plan Note (Signed)
Controlled by TSH and lack of symptoms.  Refilled synthroid.

## 2015-04-29 NOTE — Assessment & Plan Note (Signed)
Acute exacerbation.  No red flags. Toradol 60 mg given here in clinic for relief.   Tramadol refill.  Still has some flexeril at home. Recommended to retake this. Resume physical activity as soon as possible.

## 2015-04-30 LAB — HEPATITIS PANEL, ACUTE
HCV AB: NEGATIVE
Hep A IgM: NONREACTIVE
Hep B C IgM: NONREACTIVE
Hepatitis B Surface Ag: NEGATIVE

## 2015-05-02 ENCOUNTER — Encounter: Payer: Self-pay | Admitting: Family Medicine

## 2015-05-05 ENCOUNTER — Ambulatory Visit (INDEPENDENT_AMBULATORY_CARE_PROVIDER_SITE_OTHER): Payer: Medicare Other | Admitting: Family Medicine

## 2015-05-05 ENCOUNTER — Encounter: Payer: Self-pay | Admitting: Family Medicine

## 2015-05-05 VITALS — BP 134/66 | HR 78 | Temp 98.2°F | Ht 71.5 in | Wt 301.0 lb

## 2015-05-05 DIAGNOSIS — W010XXA Fall on same level from slipping, tripping and stumbling without subsequent striking against object, initial encounter: Secondary | ICD-10-CM

## 2015-05-05 DIAGNOSIS — S7000XA Contusion of unspecified hip, initial encounter: Secondary | ICD-10-CM | POA: Insufficient documentation

## 2015-05-05 DIAGNOSIS — W1839XA Other fall on same level, initial encounter: Secondary | ICD-10-CM | POA: Diagnosis not present

## 2015-05-05 DIAGNOSIS — S7002XA Contusion of left hip, initial encounter: Secondary | ICD-10-CM

## 2015-05-05 MED ORDER — KETOROLAC TROMETHAMINE 60 MG/2ML IM SOLN
60.0000 mg | Freq: Once | INTRAMUSCULAR | Status: AC
  Administered 2015-05-05: 60 mg via INTRAMUSCULAR

## 2015-05-05 NOTE — Patient Instructions (Signed)

## 2015-05-12 NOTE — Assessment & Plan Note (Signed)
Fell on Monday in yard, hit left leg on ground (knee to hip) pain and bruising since, immediate ambulation, normal ROM and strength on exam - toradol 60mg  x1 - continue tramadol, flexeril, tylenol prn at home - rtc for worsening or failure to improve by next week

## 2015-05-12 NOTE — Progress Notes (Signed)
   Subjective:   Brianna MannersBarbara F Wheeler is a 59 y.o. female with a history of DM, HTN, OA here for fall.  Patient reports she was working in her yard on Monday and tripped on some pavers that were stacked in her walkway. She landed on her L hip primarily but thinks she may have also hit her left knee on the way down. She did not his her head. She was able to get up and walk into the house. She had severe pain to her knee and hip but assumed that it could not be broken if she could walk so stayed home. Pain has persisted since and the following day she developed a large bruise to her left hip that she thought she should get checked out. She has been taking her chronic back pain meds which have helped some but she is hoping to get an injection as she reports toradol helps the most with her pain.   Review of Systems:  Per HPI. All other systems reviewed and are negative.   PMH, PSH, Medications, Allergies, and FmHx reviewed and updated in EMR.  Social History: never smoker  Objective:  BP 134/66 mmHg  Pulse 78  Temp(Src) 98.2 F (36.8 C) (Oral)  Ht 5' 11.5" (1.816 m)  Wt 301 lb (136.533 kg)  BMI 41.40 kg/m2  Gen:  59 y.o. female in NAD HEENT: NCAT, MMM, EOMI, PERRL, anicteric sclerae CV: RRR, no MRG, no JVD Resp: Non-labored, CTAB, no wheezes noted Abd: Soft, NTND, BS present, no guarding or organomegaly Ext: WWP, no edema MSK: Full ROM, strength intact. L hip with large black bruise, tender to palpation, no bony tenderness and normal ROm and ambulation Neuro: Alert and oriented, speech normal      Chemistry      Component Value Date/Time   NA 137 04/29/2015 1034   K 3.3* 04/29/2015 1034   CL 100 04/29/2015 1034   CO2 28 04/29/2015 1034   BUN 8 04/29/2015 1034   CREATININE 0.78 04/29/2015 1034   CREATININE 0.7 01/29/2010 1517      Component Value Date/Time   CALCIUM 9.0 04/29/2015 1034   ALKPHOS 48 04/29/2015 1034   AST 51* 04/29/2015 1034   ALT 33* 04/29/2015 1034   BILITOT 1.0  04/29/2015 1034      Lab Results  Component Value Date   WBC 4.0 12/31/2014   HGB 12.5 12/31/2014   HCT 38.8 12/31/2014   MCV 79.3 12/31/2014   PLT 229 12/31/2014   Lab Results  Component Value Date   TSH 0.644 12/31/2014   Lab Results  Component Value Date   HGBA1C 6.6 04/29/2015   Assessment & Plan:     Brianna MannersBarbara F Southwell is a 59 y.o. female here for fall  Contusion, hip Fell on Monday in yard, hit left leg on ground (knee to hip) pain and bruising since, immediate ambulation, normal ROM and strength on exam - toradol 60mg  x1 - continue tramadol, flexeril, tylenol prn at home - rtc for worsening or failure to improve by next week   Beverely LowElena Chris Cripps, MD, MPH [facility] PGY-3 05/12/2015 4:22 PM

## 2015-05-16 ENCOUNTER — Telehealth: Payer: Self-pay | Admitting: Family Medicine

## 2015-05-16 NOTE — Telephone Encounter (Signed)
[name] is here and is asking for a letter to excuse her from [employer] with regard to taking care of her son, Brianna Wheeler 01/25/1980, as well as her medical condition(s). She needs this letter by [facility]2PM Thursday, 05/19/2015. Sadie Reynolds, ASA

## 2015-05-18 NOTE — Telephone Encounter (Signed)
Printed letter. Put up front. Called pt to inform her the letter is up front. Sunday SpillersSharon T Teriah Muela, CMA

## 2015-05-18 NOTE — Telephone Encounter (Signed)
I have completed this letter.  Please print and leave up front for Mrs. Brugger and let her know it is ready.  Thanks!  JW

## 2015-06-21 ENCOUNTER — Ambulatory Visit (INDEPENDENT_AMBULATORY_CARE_PROVIDER_SITE_OTHER): Payer: Medicare Other | Admitting: Family Medicine

## 2015-06-21 ENCOUNTER — Encounter: Payer: Self-pay | Admitting: Family Medicine

## 2015-06-21 VITALS — BP 132/74 | HR 72 | Temp 98.4°F | Ht 71.0 in | Wt 298.9 lb

## 2015-06-21 DIAGNOSIS — G479 Sleep disorder, unspecified: Secondary | ICD-10-CM | POA: Diagnosis not present

## 2015-06-21 DIAGNOSIS — R5383 Other fatigue: Secondary | ICD-10-CM | POA: Diagnosis not present

## 2015-06-21 LAB — TSH: TSH: 0.806 u[IU]/mL (ref 0.350–4.500)

## 2015-06-21 MED ORDER — TRAZODONE HCL 100 MG PO TABS: 100.0000 mg | ORAL_TABLET | Freq: Every day | ORAL | Status: DC

## 2015-06-21 NOTE — Progress Notes (Signed)
Patient ID: Brianna Wheeler, female   DOB: 07/18/1956, 59 y.o.   MRN: [id number]   Subjective: CC:  Dry, pruritic skin HPI: This history was provided by the patient.  Patient's PMH significant for hypertension, diabetes mellitus, low back pain, hyperlipidemia, asthma, seasonal allergies and depression.  Patient is a 59 y.o. female presenting to clinic today with complaints of dry, pruritic skin and scalp x2 days, fatigue x2 months and cold intolerance since "forever."  Patient states she also believes she has gained weight over the past 2 months.  Patient also complains of sleeplessness x3 years since her husband died and chronic low back and neck pain.  Patient is taking Tramadol to manage pain with some relief.  Patient states she was on a sleep aid at one time, but now takes nothing to help her sleep.  She states she has tried to establish a good sleep hygiene routine, but still struggles to fall asleep.  Patient denies hair loss, palpitations, shortness of breath, wheezing, cough, sore throat, nasal congestion,  N/V/D, constipation, fever, cold chills, chest pain, abdominal pain and headache.  Concerns today include:  1. Hypothyroidism  Social History Reviewed: never smoker. FamHx and MedHx updated.  Please see EMR.  ROS: Per HPI  Objective: Office vital signs reviewed. BP 132/74 mmHg  Pulse 72  Temp(Src) 98.4 F (36.9 C) (Oral)  Ht 5\' 11"  (1.803 m)  Wt 298 lb 14.4 oz (135.58 kg)  BMI 41.71 kg/m2  Physical Examination:  General: Pleasant, cooperative female.  Awake, alert, well-nourished, NAD Cardio: RRR, S1S2 heard, no murmurs appreciated, no lower extremity or pedal edema, cyanosis or clubbing; +2 radial pulses bilaterally Pulm: CTAB, no wheezes, rhonchi or rales MSK: Normal gait and station, no arthralgias Skin: Dry, intact, no rashes or lesions.  Not excessively dry or scaling. Neuro: Strength and sensation grossly intact  Assessment/ Plan: 59 y.o. female presents with  concern for hypothyroidism, sleeplessness and fatigue.  Patient believes fatigue may be related to sleeplessness, but is also concerned that her thyroid hormone may be low.  Patient also mentioned her concern over what will happen to her adult son when she passes or is unable to care for him.  Patient expresses some anxiety related to this.  Patient is under a good deal of stress at home related to familial issues and caring for her adult son who has cognitive delays.  We will check TSH level today, but discussed with patient that fatigue may be more related to sleeplessness than thyroid hormone.  Patient has used Trazodone in the past to help her sleep and states it helped some.    We will restart Trazodone and patient will continue sleep hygiene efforts.  Hopefully, once she has an established sleep pattern, fatigue will improve.  Patient is to contact PCP in one week to discuss how Trazodone is working for her.    Patient will schedule follow-up appointment with PCP and clinic Social Worker in February 2017 to discuss care for her son in her absence.  Plan discussed with patient and she verbalizes understanding and agreement.  1. Other fatigue - TSH checked in clinic today - Believe this is most likely secondary to lack of sleep rather than anything else. See below  2. Sleep disorder - Will restart Trazodone 30 minutes before desired bed time. - Feel anxiety is most likely contributing to this -- but want to rule out simple insomnia first. -Discussed that we need to discuss long-term plans for her son Beverely Pace has  cognitive delays and will do this in January February. -See instructions regarding follow-up   Follow-up: Patient to contact PCP next week to discuss how Trazodone is working for her in regards to sleeplessness.    Patient to schedule follow-up with PCP and social worker in February 2017 to discuss long-term care plan for her adult son.   Nelly Rout, NP Student [facility]

## 2015-06-21 NOTE — Patient Instructions (Signed)
CALL ME next week to let me know how the sleep is going.  Before the puppy shows up.  We'll check the thyroid level today.    We'll make some decisions then.  [facility] in February [facility]

## 2015-06-27 ENCOUNTER — Telehealth: Payer: Self-pay | Admitting: Family Medicine

## 2015-06-27 NOTE — Telephone Encounter (Signed)
Pt called and needs a refill on her Hydrochlorothiazide called in. jw °

## 2015-06-28 MED ORDER — HYDROCHLOROTHIAZIDE 25 MG PO TABS: 25.0000 mg | ORAL_TABLET | Freq: Every day | ORAL | Status: DC

## 2015-07-19 ENCOUNTER — Ambulatory Visit: Payer: Medicare Other | Admitting: Family Medicine

## 2015-07-21 ENCOUNTER — Encounter: Payer: Self-pay | Admitting: Family Medicine

## 2015-07-21 ENCOUNTER — Ambulatory Visit (INDEPENDENT_AMBULATORY_CARE_PROVIDER_SITE_OTHER): Payer: Medicare Other | Admitting: Family Medicine

## 2015-07-21 VITALS — BP 139/82 | HR 80 | Temp 98.3°F | Wt 299.0 lb

## 2015-07-21 DIAGNOSIS — M545 Low back pain, unspecified: Secondary | ICD-10-CM

## 2015-07-21 MED ORDER — KETOROLAC TROMETHAMINE 60 MG/2ML IM SOLN
60.0000 mg | Freq: Once | INTRAMUSCULAR | Status: AC
  Administered 2015-07-21: 60 mg via INTRAMUSCULAR

## 2015-07-21 NOTE — Patient Instructions (Signed)
Thank you for coming in,   Please let us know if you back pain doesn't get better.   Sign up for My Chart to have easy access to your labs results, and communication with your Primary care physician   Please feel free to call with any questions or concerns at any time, at [phone]. --Dr. Jordan LikesSchmitz

## 2015-07-21 NOTE — Progress Notes (Signed)
   Subjective:    Patient ID: Brianna MannersBarbara F Breeden, female    DOB: 11/24/1955, 59 y.o.   MRN: [id number]  Seen for Same day visit for   CC: BACK PAIN  Pain is occuring in the middle of her back and radiating to her lumbar region.  She is more busy than normal and cooking as the reason as why her pain is exacerbated.  Received a Toradol injection in October 2016 and lasted a couple of days ago.  History of back surgery laminectomy at L4-L5.  Having exacerbation of her back pain.  She has been using tramadol  Back pain began 3 days ago. Pain is described as achy. Having some radiculopathy down her right.  History of [facility] or [facility] Cervical bulging disc from this  Prior history of similar pain: yes no History of cancer: no Weak immune system:  no History of IV drug use: no History of steroid use: no  Symptoms Incontinence of bowel or bladder:  no Numbness of leg: no Fever: no Rest or Night pain: no Weight Loss:  no Rash: no  Cannot take NSAIDS as she has acid reflux and is on omeprazole.  Lumbar imaging from 2014 showing degenerative changes and old T11 compression fracture  Thoracic x-ray from 2011 scoliosis and degenerative changes.  Review of Systems   See HPI for ROS. Objective:  BP 139/82 mmHg  Pulse 80  Temp(Src) 98.3 F (36.8 C) (Oral)  Wt 299 lb (135.626 kg)  General: NAD, morbidly obese  Extremities: WWP. Skin: warm and dry, no rashes noted Neuro: alert and oriented, no focal deficits Back:  Appearance: Not obvious scoliosis Palpation: Tenderness to palpation of the right paraspinal muscles from T10-L5  Flexion: Limited flexion to about 110 Extension: Full extension but was not without pain Hip:  Appearance: No obvious defects Palpation: No tenderness to palpation of the greater trochanter bilaterally Rotation Reduced: Normal internal/external rotation Neuro: Strength hip flexion 5/5,  knee flexion 5/5, dorsiflexion 5/5,  plantar flexion 5/5 Reflexes: patella 2/2 Bilateral  Achilles 2/2 Bilateral Straight Leg Raise: negative Sensation to light touch intact: yes Neurovascularly intact    Assessment & Plan:   Lumbago Pain most likely an exacerbation of her musculoskeletal pain secondary to strain versus spasm Similar to her presentation in October Imaging from previous studies showing scoliosis which will make this a reoccurrence if she doesn't perform stretching and exercise - Toradol injection of 60 mg today - Given sheet of home modalities - Follow-up as needed

## 2015-07-22 NOTE — Assessment & Plan Note (Signed)
Pain most likely an exacerbation of her musculoskeletal pain secondary to strain versus spasm Similar to her presentation in October Imaging from previous studies showing scoliosis which will make this a reoccurrence if she doesn't perform stretching and exercise - Toradol injection of 60 mg today - Given sheet of home modalities - Follow-up as needed

## 2015-08-26 ENCOUNTER — Ambulatory Visit (INDEPENDENT_AMBULATORY_CARE_PROVIDER_SITE_OTHER): Payer: Medicare Other | Admitting: Student

## 2015-08-26 ENCOUNTER — Encounter: Payer: Self-pay | Admitting: Student

## 2015-08-26 VITALS — Temp 98.1°F | Wt 300.5 lb

## 2015-08-26 DIAGNOSIS — M79672 Pain in left foot: Secondary | ICD-10-CM

## 2015-08-26 NOTE — Assessment & Plan Note (Signed)
Chronic pain and swelling left ankle and foot. No swelling noted today however mild pain with flexion-extension. Similar to previous episodes that have been treated with rest, foot elevation, and tramadol with the use of ASO brace.  - Will continue conservative management with ASO brace, tramadol, leg elevation - If pain worsens, or she begins to have difficulty with function she will return for reevaluation

## 2015-08-26 NOTE — Progress Notes (Signed)
Subjective:    Patient ID: Brianna Wheeler, female    DOB: 1956-05-28, 60 y.o.   MRN: [id number]   CC: Left ankle swelling  HPI:  60 year old female with history of left foot injury presents for left foot swelling  Left foot swelling - Started 3 days ago, and can think of no trauma that caused it - She reports that she occasionally has swelling of the left foot since her injury proximately 10 years ago - She has a, ASO brace at home that she uses for the swelling which she last used yesterday - Since the accident, she states that her left ankle pain and swelling " flare " and she feels like they are flaring now - She typically treats this with rest, left leg elevation, and tramadol - She denies calf pain or tenderness, rash, warmth - She has been able to ambulate normally - This morning she feels she still has swelling but it may be getting better  Review of Systems ROS Per the history of present illness, otherwise she denies fevers, recent illness, nausea, vomiting, diarrhea, headache Past Medical, Surgical, Social, and Family History Reviewed & Updated per EMR.   Objective:  Temp(Src) 98.1 F (36.7 C) (Oral)  Wt 300 lb 8 oz (136.306 kg) Vitals and nursing note reviewed  General: NAD Cardiac: RR Respiratory: CTAB, normal effort Extremities: No lower extremity edema noted, 2+ bilateral DP pulses and PT pulses. Pain with flexion and dorsiflexion of the left ankle. No erythema, warmth. No calf erythema, tenderness, warmth Neuro: alert and oriented, no focal deficits   Assessment & Plan:    Pain in joint, ankle and foot Chronic pain and swelling left ankle and foot. No swelling noted today however mild pain with flexion-extension. Similar to previous episodes that have been treated with rest, foot elevation, and tramadol with the use of ASO brace.  - Will continue conservative management with ASO brace, tramadol, leg elevation - If pain worsens, or she begins to have  difficulty with function she will return for reevaluation     Tazia Illescas A. Kennon Rounds MD, MS Family Medicine Resident PGY-2 Pager [phone]

## 2015-08-26 NOTE — Patient Instructions (Addendum)
Follow-up with PCP as needed for foot swelling Try compression stockings for foot swelling If worsening pain call the office for reevaluation If you have any questions or concerns call the office at [phone]

## 2015-08-30 ENCOUNTER — Ambulatory Visit (INDEPENDENT_AMBULATORY_CARE_PROVIDER_SITE_OTHER): Payer: Medicare Other | Admitting: Family Medicine

## 2015-08-30 ENCOUNTER — Encounter: Payer: Self-pay | Admitting: Family Medicine

## 2015-08-30 VITALS — BP 137/69 | HR 84 | Temp 98.1°F | Wt 300.0 lb

## 2015-08-30 DIAGNOSIS — J302 Other seasonal allergic rhinitis: Secondary | ICD-10-CM

## 2015-08-30 DIAGNOSIS — J309 Allergic rhinitis, unspecified: Secondary | ICD-10-CM | POA: Diagnosis not present

## 2015-08-30 NOTE — Progress Notes (Signed)
Subjective: Brianna Wheeler is a 60 y.o. female here for cough.  She was cleaning out her very dusty closets 4 nights ago when she developed a cough. Cough is nonproductive, worse in the morning, but she notes nasal discharge that is yellow/green and congestion. She reports some facial pressure worse with blowing nose or bending forward, + postnasal drip, ears "feel funny," itching. She's taking allegra and flonase regularly.   - She is a never smoker - PMH: has Dx of asthma but has not needed to take any inhaler. - ROS: No fevers, wheezing, trouble breathing, hemoptysis.  Objective: BP 137/69 mmHg  Pulse 84  Temp(Src) 98.1 F (36.7 C) (Oral)  Wt 300 lb (136.079 kg) Gen:  59 y.o. female in no distress HEENT: MMM, TMs with normal position without inflammation or effusion, canals normal. EOMI, PERRL, conjunctivae normal. Turbinates normal. Oropharynx erythematous with +postnasal drip. CV: RRR, no murmur, no JVD Resp: Non-labored, CTAB, no wheezes noted Skin: No exanthem noted  Assessment & Plan: Brianna Wheeler is a 60 y.o. female with allergic rhinitis and upper airway cough syndrome. - Continue po antihistamine and flonase.  - Nasal saline irrigation, humidifier recommended and cough lozenges prn. - Return if trouble breathing or fever.

## 2015-08-30 NOTE — Patient Instructions (Addendum)
Use your netty pot. - Keep using flonase and allegra.  - Use nasal saline spray as needed to help clear runny nose. - Take warm showers and drink hot tea. The humid air will help with runny nose and congestion.  - Take 1 tbsp of honey every 2 hours to help with cough and sore throat. - You can use tylenol alternating with motrin every 3 hours for fever with pain.  - All members in the household should wash their hands frequently. - Minimize your exposure to smoke from cigarettes, etc.  If you're not getting better in the next week or you have fever or trouble breathing, you need to return for care.

## 2015-09-02 ENCOUNTER — Encounter: Payer: Self-pay | Admitting: Family Medicine

## 2015-09-02 ENCOUNTER — Ambulatory Visit (INDEPENDENT_AMBULATORY_CARE_PROVIDER_SITE_OTHER): Payer: Medicare Other | Admitting: Family Medicine

## 2015-09-02 ENCOUNTER — Ambulatory Visit
Admission: RE | Admit: 2015-09-02 | Discharge: 2015-09-02 | Disposition: A | Discharge status: Home / Self Care | Payer: Medicare Other | Source: Ambulatory Visit | Attending: Family Medicine | Admitting: Family Medicine

## 2015-09-02 VITALS — BP 118/68 | HR 78 | Temp 98.3°F | Wt 300.0 lb

## 2015-09-02 DIAGNOSIS — M25572 Pain in left ankle and joints of left foot: Secondary | ICD-10-CM

## 2015-09-02 DIAGNOSIS — M79672 Pain in left foot: Secondary | ICD-10-CM | POA: Diagnosis not present

## 2015-09-02 MED ORDER — TRAMADOL HCL 50 MG PO TABS: 50.0000 mg | ORAL_TABLET | Freq: Two times a day (BID) | ORAL | Status: DC | PRN

## 2015-09-02 NOTE — Patient Instructions (Signed)
Keep taking the Tramadol for pain relief.  Wear the post-op shoe whenever you're up and moving around.    Rest your foot and keep it iced and elevated as much as possible.   I'll call you with the result.

## 2015-09-02 NOTE — Progress Notes (Signed)
Subjective:    Brianna Wheeler is a 60 y.o. female who presents to [facility] today for Left foot pain:  1.  Left foot pain:  Started 9 days after she had been cleaning her house and property. No trauma or incidents at home, no falls..  Woke up and it started to hurt.  Presented to care about 4 days ago.  Conservative treatment recommended at that time with ASO brace, Tramadol, leg elevation.  Has had worsening swlling in Left foot and ankle since that visit.  Describes pain over the top of her left foot. She is walking with a limp. No pain in ankle or calf. She had multiple broken bones and torn ligaments and same foot in 2005 after accident in her garage at home.  ROS as above per HPI, otherwise neg.   The following portions of the patient's history were reviewed and updated as appropriate: allergies, current medications, past medical history, family and social history, and problem list. Patient is a nonsmoker.    PMH reviewed.  Past Medical History  Diagnosis Date  . Hypertension   . Hyperlipidemia   . Diabetes mellitus   . Left foot pain 2005    Chronic injury -- multiple broken bones and torn ligaments after accident in garage   Past Surgical History  Procedure Laterality Date  . Tubal ligation      Medications reviewed. Current Outpatient Prescriptions  Medication Sig Dispense Refill  . albuterol (PROAIR HFA) 108 (90 BASE) MCG/ACT inhaler Inhale 2 puffs into the lungs every 6 (six) hours as needed for wheezing or shortness of breath. 8.5 g 1  . aspirin (ASPIR-81) 81 MG EC tablet Take 81 mg by mouth daily.      . cyclobenzaprine (FLEXERIL) 10 MG tablet Take 1 tablet (10 mg total) by mouth 2 (two) times daily. 60 tablet 0  . EPINEPHrine (EPI-PEN) 0.3 mg/0.3 mL DEVI Inject 0.3 mLs (0.3 mg total) into the muscle once. 2 Device 0  . fexofenadine (ALLEGRA) 180 MG tablet Take 180 mg by mouth daily.      . fluticasone (FLONASE) 50 MCG/ACT nasal spray Place 2 sprays in each nostril daily 32 g  12  . hydrochlorothiazide (HYDRODIURIL) 25 MG tablet Take 1 tablet (25 mg total) by mouth daily. 90 tablet 2  . levothyroxine (SYNTHROID, LEVOTHROID) 112 MCG tablet Take 1 tablet (112 mcg total) by mouth daily. 30 tablet 3  . omeprazole (PRILOSEC) 20 MG capsule Take 1 capsule (20 mg total) by mouth daily. 30 capsule 6  . simvastatin (ZOCOR) 40 MG tablet Take 1 tablet (40 mg total) by mouth at bedtime. 90 tablet 2  . traMADol (ULTRAM) 50 MG tablet Take 1 tablet (50 mg total) by mouth 2 (two) times daily as needed. for pain 60 tablet 2  . traZODone (DESYREL) 100 MG tablet Take 1 tablet (100 mg total) by mouth at bedtime. 30 tablet 1  . zoster vaccine live, PF, (ZOSTAVAX) 14782 UNT/0.65ML injection Inject 19,400 Units into the skin once. 1 each 0   No current facility-administered medications for this visit.     Objective:   Physical Exam There were no vitals taken for this visit. Gen:  Alert, cooperative patient who appears stated age in no acute distress.  Vital signs reviewed. MSK: Right foot within normal limits. Left foot tender palpation along dorsum of foot. It is swollen here as well. No tenderness palpation along left fifth metatarsal. No tenderness along navicular. No tenderness along bottom/arch of foot. She  is able to fully flex and extend her toes though against resistance this causes her pain with dorsiflexion. No bruising noted.  No results found for this or any previous visit (from the past 72 hour(s)).

## 2015-09-02 NOTE — Assessment & Plan Note (Signed)
I think this is most likely tendinitis in her left foot based on history of overuse, examination of swelling and tenderness along her some of foot, no history of drug trauma or fall. However I'm going to send her for x-rays as she is diabetic and could have had a fracture that she does not know about. Treatment tramadol. I wrote her for a postop shoe. I would like to see her back in 2 weeks to assess for improvement. We'll call with results of x-ray.

## 2015-09-05 ENCOUNTER — Telehealth: Payer: Self-pay | Admitting: Family Medicine

## 2015-09-05 NOTE — Telephone Encounter (Signed)
Would like results from foot xrays

## 2015-09-05 NOTE — Telephone Encounter (Signed)
Called and relayed negative x-ray.  Likely tendonitis.  To continue with ice, elevation, post-op shoe, tramadol for pain relief. Also encouraged NSAIDs for inflammation.  I'll see her back in 2 weeks to see how she's doing, sooner if worse.  Patient expressed appreciation for call.  All questions answered.

## 2015-09-06 NOTE — Telephone Encounter (Signed)
Will forward to Dr. Walden. Jazmin Hartsell,CMA  

## 2015-09-07 NOTE — Telephone Encounter (Signed)
See OV note.  Called and discussed with patient on 2/13.

## 2015-09-11 ENCOUNTER — Telehealth: Payer: Self-pay | Admitting: Family Medicine

## 2015-09-11 NOTE — Telephone Encounter (Signed)
After hours telephone call  Was told by Dr Gwendolyn Grant that she had tendonitis in her foot and put in post-op shoe.  Reports that she has a herniated disk in back and wearing shoe is making back pain worse.  She has been taking tramadol and hot baths for back but it wasn't helping.  Back pain is worse than foot pain was.  Advised patient to stop wearing post-op shoe. She is to make f/u appt in clinic to discuss tendonitis if it worsens.  Erasmo Downer, MD, MPH PGY-2,  [facility] 09/11/2015 7:13 PM

## 2015-09-14 ENCOUNTER — Ambulatory Visit (INDEPENDENT_AMBULATORY_CARE_PROVIDER_SITE_OTHER): Payer: Medicare Other | Admitting: Family Medicine

## 2015-09-14 ENCOUNTER — Encounter: Payer: Self-pay | Admitting: Family Medicine

## 2015-09-14 VITALS — Temp 98.2°F | Wt 303.1 lb

## 2015-09-14 DIAGNOSIS — M545 Low back pain, unspecified: Secondary | ICD-10-CM

## 2015-09-14 DIAGNOSIS — M546 Pain in thoracic spine: Secondary | ICD-10-CM | POA: Diagnosis not present

## 2015-09-14 MED ORDER — KETOROLAC TROMETHAMINE 30 MG/ML IJ SOLN
30.0000 mg | Freq: Once | INTRAMUSCULAR | Status: AC
Start: 2015-09-14 — End: 2015-09-14
  Administered 2015-09-14: 30 mg via INTRAMUSCULAR

## 2015-09-14 NOTE — Patient Instructions (Signed)
We will give you a toradol shot today in clinic.  Continue using the tylenol, heating pads, tramadol when it gets severe. Using the flexeril mostly at night or when you are staying at home and not driving.  Continue your back exercises; laying in bed will make things worse.  Avoid using the post op shoe.

## 2015-09-14 NOTE — Progress Notes (Signed)
   Subjective:    Patient ID: Brianna Wheeler, female    DOB: Jun 10, 1956, 60 y.o.   MRN: [id number]  HPI  Patient presents for Same Day Appointment  CC: back pain  # Right sided back pain:  Started 4 days ago  Right low and mid back  Was recently in a left post op shoe for left foot tendinitis  Pain does not radiate down her legs, but she says she has had a history of sciatica  No leg numbness or tingling  Has tried tylenol, tramadol, aspercreme, flexeril, heating pads without much relief so far ROS: no cough, trouble breathing, chest pain  Social Hx: never smoker  Review of Systems   See HPI for ROS.   Past medical history, surgical, family, and social history reviewed and updated in the EMR as appropriate.  Objective:  Temp(Src) 98.2 F (36.8 C) (Oral)  Wt 303 lb 1.6 oz (137.485 kg) Vitals and nursing note reviewed  General: no apparent distress  Back: there is moderate tenderness right lateral low and mid thoracic back pain. ROM limited in trunk twisting, forward flexion, extension is normal. SLR testing negative. Neuro: normal gait.   Assessment & Plan:   1. Right-sided low back pain without sciatica, Right-sided thoracic back pain In setting of recent post op shoe, may have developed some muscular strain with compensation in height difference with the leg. Recommended continuing current treatments (HEP, tylenol, heating pads, tramadol, flexeril) and likely to improve with time. Given  toradol shot in clinic today.

## 2015-09-19 ENCOUNTER — Telehealth: Payer: Self-pay | Admitting: Family Medicine

## 2015-09-19 NOTE — Telephone Encounter (Signed)
Her toes are swollen and the area where the toes join the foot is red and bruised looking.  What else can she do?

## 2015-09-20 ENCOUNTER — Ambulatory Visit (INDEPENDENT_AMBULATORY_CARE_PROVIDER_SITE_OTHER): Payer: Medicare Other | Admitting: Family Medicine

## 2015-09-20 ENCOUNTER — Encounter: Payer: Self-pay | Admitting: Family Medicine

## 2015-09-20 VITALS — BP 160/88 | HR 80 | Temp 98.9°F | Ht 71.0 in | Wt 304.7 lb

## 2015-09-20 DIAGNOSIS — M7741 Metatarsalgia, right foot: Secondary | ICD-10-CM | POA: Insufficient documentation

## 2015-09-20 DIAGNOSIS — M7742 Metatarsalgia, left foot: Secondary | ICD-10-CM | POA: Diagnosis not present

## 2015-09-20 NOTE — Telephone Encounter (Signed)
Spoke to pt. She will make an appt to come in and see a Dr. Sunday Spillers, CMA

## 2015-09-20 NOTE — Progress Notes (Signed)
Subjective: Brianna Wheeler is a 60 y.o. female presenting for left foot swelling and pain.   This is an ongoing complaint, gradually worsening over the past month with no inciting trauma, just woke up with it. She's been evaluated here on a few occasions, had negative XR's and told to wear a post op shoe which worsened the pain. An ASO brace was also painful and discontinued. She's been trying to stay off it but has responsibilities requiring her to be on her feet ~10hrs per day. The pain today is moderate, severe when wearing a shoe, on the top of the foot. Notes it is swollen, worse in the evening than the morning, and red when taken out of a shoe which compresses it. She uses ice, elevation, takes tylenol and tramadol with some relief. Has a history of ?gastric ulcer and was told not to take NSAIDs. Has a history of multiple fractures in this foot in 2005 after trauma.   - No fevers, leg swelling.  - Never smoker with T2DM  Objective: BP 160/88 mmHg  Pulse 80  Temp(Src) 98.9 F (37.2 C) (Oral)  Ht  (1.803 m)  Wt 304 lb 11.2 oz (138.211 kg)  BMI 42.52 kg/m2 Gen: Pleasant obese 60 y.o. female in no distress MSK: Right foot wnl, left foot dorsum swelling without erythema or warmth, tender to palpation diffusely over dorsum and pain with toes 2 - 5 dorsiflexion. Pain with metatarsal compression but no tenderness at head, navicular, malleoli. Bottom of foot normal and non tender with sensation intact to monofilament test.   Assessment/Plan: Brianna Wheeler is a 60 y.o. female here for left foot pain consistent with metatarsalgia and/or tendonitis.  Metatarsalgia of left foot With negative XR's, has failed conservative therapy, though she has not been able to rest it as much as she should. Concerned for occult metatarsal stress fracture in this older obese female. Has diabetes, no evidence of neuropathy.  - Refer to sports medicine for U/S and consideration of MRI.  - Continue rest,  ice, analgesics. Her shoes have a solid sole and are the most comfortable for her to wear, so she will continue this.

## 2015-09-20 NOTE — Telephone Encounter (Signed)
She should be seen for this.  Can't make a diagnosis based on that description.

## 2015-09-20 NOTE — Patient Instructions (Signed)
Thank you for coming in today!  You may have a stress fracture that wouldn't show up on the xray or your tendons may be inflammed. Either way, the treatment is as follows:  - You will be contacted to schedule your appointment with sports medicine. - Continue taking the pain medications, elevating, resting and icing the foot.   Our clinic's number is [phone]. Feel free to call any time with questions or concerns. We will answer any questions after hours with our 24-hour emergency line at that number as well.   - Dr. Jarvis Newcomer

## 2015-09-20 NOTE — Assessment & Plan Note (Signed)
With negative XR's, has failed conservative therapy, though she has not been able to rest it as much as she should. Concerned for occult metatarsal stress fracture in this older obese female. Has diabetes, no evidence of neuropathy.  - Refer to sports medicine for U/S and consideration of MRI.  - Continue rest, ice, analgesics. Her shoes have a solid sole and are the most comfortable for her to wear, so she will continue this.

## 2015-09-26 ENCOUNTER — Other Ambulatory Visit: Payer: Self-pay | Admitting: Family Medicine

## 2015-09-27 ENCOUNTER — Ambulatory Visit: Payer: Medicare Other | Admitting: Family Medicine

## 2015-09-27 ENCOUNTER — Encounter: Payer: Self-pay | Admitting: Family Medicine

## 2015-09-27 ENCOUNTER — Ambulatory Visit (INDEPENDENT_AMBULATORY_CARE_PROVIDER_SITE_OTHER): Payer: Medicare Other | Admitting: Family Medicine

## 2015-09-27 VITALS — BP 127/69 | Ht 71.5 in | Wt 298.0 lb

## 2015-09-27 DIAGNOSIS — M7742 Metatarsalgia, left foot: Secondary | ICD-10-CM

## 2015-09-27 DIAGNOSIS — M79672 Pain in left foot: Secondary | ICD-10-CM

## 2015-09-28 NOTE — Progress Notes (Signed)
Brianna Wheeler - 60 y.o. female MRN [id number]  Date of birth: 06/05/1956  CC: Dorsal left foot pain.   SUBJECTIVE:   HPI  Brianna Wheeler is a very pleasant 60 year old female here with ~ 5 weeks ofleft foot pain and swelling.  She denies any acute injury.she denies any fevers chills or night sweats. She was seen several times the primary care clinic with negative x-rays. She is here today for reevaluation. She was prescribed tramadol as well. She tried an ASO brace, postop shoe,  as well as RICE. Unfortunately she is not feeling better. She is diabetic with a recent A1c is 6.6.  Notably she reports the pain began after she did a lot of moving of furniture at her house.she does report having a history of a fracture of her left foot, which she believes is a stress fracture, around 10 years ago.  ROS:     As above, no fevers, chills, night sweats, weight loss.  No rashes or joint swelling elsewhere.   HISTORY: Past Medical, Surgical, Social, and Family History Reviewed & Updated per EMR.  Pertinent Historical Findings include: HLD, HTN, asthma, T2D  OBJECTIVE: BP 127/69 mmHg  Ht 5' 11.5" (1.816 m)  Wt 298 lb (135.172 kg)  BMI 40.99 kg/m2  Physical Exam Calm, NAD Non-labored breathing.   Foot inspection and palpation reveals breakdown of the transverse arch and a drop of MT heads Generalized dorsal swelling over the left foot most prominent over the TMT joints extending distally to the MT heads of the lateral column. No bruising.   Ultrasound:long and short axis views  Of the left foot were obtained.  There is significant osteoarthritic change of the third TMT joint as well as a small dorsal calcification on the dorsal aspect of the proximal third metatarsal. No surrounding hypoechoic fluid was identified along the shaft of the third through fifth metatarsal, which were examined.  No joint or bony irregularities were identified over the metatarsal heads of digits 3 through 5.This ultrasound was  largely inconclusive but there is suggestion of a healing proximal third metatarsal stress fracture.  MEDICATIONS, LABS & OTHER ORDERS: Previous Medications   ALBUTEROL (PROAIR HFA) 108 (90 BASE) MCG/ACT INHALER    Inhale 2 puffs into the lungs every 6 (six) hours as needed for wheezing or shortness of breath.   ASPIRIN (ASPIR-81) 81 MG EC TABLET    Take 81 mg by mouth daily.     CYCLOBENZAPRINE (FLEXERIL) 10 MG TABLET    Take 1 tablet (10 mg total) by mouth 2 (two) times daily.   EPINEPHRINE (EPI-PEN) 0.3 MG/0.3 ML DEVI    Inject 0.3 mLs (0.3 mg total) into the muscle once.   FEXOFENADINE (ALLEGRA) 180 MG TABLET    Take 180 mg by mouth daily.     FLUTICASONE (FLONASE) 50 MCG/ACT NASAL SPRAY    Place 2 sprays in each nostril daily   HYDROCHLOROTHIAZIDE (HYDRODIURIL) 25 MG TABLET    Take 1 tablet (25 mg total) by mouth daily.   LEVOTHYROXINE (SYNTHROID, LEVOTHROID) 112 MCG TABLET    Take 1 tablet (112 mcg total) by mouth daily.   LEVOTHYROXINE (SYNTHROID, LEVOTHROID) 112 MCG TABLET    TAKE ONE TABLET BY MOUTH ONCE DAILY   OMEPRAZOLE (PRILOSEC) 20 MG CAPSULE    Take 1 capsule (20 mg total) by mouth daily.   SIMVASTATIN (ZOCOR) 40 MG TABLET    Take 1 tablet (40 mg total) by mouth at bedtime.   TRAMADOL (ULTRAM) 50 MG  TABLET    Take 1 tablet (50 mg total) by mouth 2 (two) times daily as needed. for pain   TRAZODONE (DESYREL) 100 MG TABLET    Take 1 tablet (100 mg total) by mouth at bedtime.   ZOSTER VACCINE LIVE, PF, (ZOSTAVAX) 13086 UNT/0.65ML INJECTION    Inject 19,400 Units into the skin once.   Modified Medications   No medications on file   New Prescriptions   No medications on file   Discontinued Medications   No medications on file   Orders Placed This Encounter  Procedures  . MR Foot Left Wo Contrast   ASSESSMENT & PLAN: Left foot pain with dorsal swelling: History,exam,&  ultrasound are suspicious for a third metatarsal stress fracture. She reports having a stress fracture in  her left foot roughly 10 years ago.The diffuse nature of her swelling along with her equivocal ultrasound makes it difficult to confidently assign a diagnosis . Considering the pain is refractory to conservative management over the last month including a postop shoe we feel it is necessary to obtain an MRI. Additionally we have provided her with a cam walker boot as this is much more comfortable for her than a postop shoe. We will see her back as needed MRI and discuss further management.Please call with any questions.  Guinevere Scarlet, M.D.

## 2015-09-30 ENCOUNTER — Ambulatory Visit: Payer: Medicare Other | Admitting: Family Medicine

## 2015-10-04 ENCOUNTER — Ambulatory Visit: Payer: Medicare Other | Admitting: Family Medicine

## 2015-10-06 ENCOUNTER — Inpatient Hospital Stay: Admission: RE | Admit: 2015-10-06 | Payer: Medicare Other | Source: Ambulatory Visit

## 2015-10-11 ENCOUNTER — Ambulatory Visit: Payer: Medicare Other | Admitting: Family Medicine

## 2015-10-13 ENCOUNTER — Ambulatory Visit
Admission: RE | Admit: 2015-10-13 | Discharge: 2015-10-13 | Disposition: A | Discharge status: Home / Self Care | Payer: Medicare Other | Source: Ambulatory Visit | Attending: Family Medicine | Admitting: Family Medicine

## 2015-10-13 DIAGNOSIS — M79672 Pain in left foot: Secondary | ICD-10-CM

## 2015-10-13 DIAGNOSIS — M7742 Metatarsalgia, left foot: Secondary | ICD-10-CM

## 2015-10-18 ENCOUNTER — Ambulatory Visit (INDEPENDENT_AMBULATORY_CARE_PROVIDER_SITE_OTHER): Payer: Medicare Other | Admitting: Family Medicine

## 2015-10-18 ENCOUNTER — Encounter: Payer: Self-pay | Admitting: Family Medicine

## 2015-10-18 VITALS — BP 142/68 | HR 84 | Ht 71.0 in | Wt 298.0 lb

## 2015-10-18 DIAGNOSIS — Q667 Congenital pes cavus, unspecified foot: Secondary | ICD-10-CM

## 2015-10-18 DIAGNOSIS — R269 Unspecified abnormalities of gait and mobility: Secondary | ICD-10-CM

## 2015-10-18 NOTE — Progress Notes (Signed)
Brianna MannersBarbara F Wheeler - 60 y.o. female MRN [id number]  Date of birth: 10/08/1955  CC: Dorsal left foot pain and swelling follow-up.   SUBJECTIVE:   HPI  Mrs. Brianna Wheeler is a very pleasant 60 year old female here to follow-up with ~ 8 weeks of left foot pain and swelling. She was last seen 3 weeks ago on 09/27/2015. In the interim she has had an MRI showing no bony abnormality but prominent soft tissue edema. She denies any acute injury. She denies any fevers chills or night sweats. She has tried an ASO brace, postop shoe as well as a boot, which we provided her last visit and was too uncomfortable. Over the last week she has significantly rested her foot and she is now doing much better. She is diabetic with a recent A1c is 6.6.  Notably she reports the pain began after she did a lot of moving of furniture at her house. again she is doing much better at this time. She does still have minimal lateral forefoot edema but her pain is almost nonexistent.. Currently she rates her pain as 3 out of 10. She is taking tramadol and Tylenol for her back and this is helping.  ROS:     As above, no fevers, chills, night sweats, weight loss.  No rashes or joint swelling elsewhere.   HISTORY: Past Medical, Surgical, Social, and Family History Reviewed & Updated per EMR.  Pertinent Historical Findings include: HLD, HTN, asthma, T2D  MRI: MRI dated 10/13/2015 shows significant dorsal forefoot swelling but no bony abnormality.  OBJECTIVE: BP 142/68 mmHg  Pulse 84  Ht 5\' 11"  (1.803 m)  Wt 298 lb (135.172 kg)  BMI 41.58 kg/m2  Physical Exam Calm, NAD Non-labored breathing.   Foot inspection and palpation reveals breakdown of the transverse arch and a drop of MT heads Now localized dorsal swelling over the left lateral forefoot . She has a cavus foot with hallux varus. She walks with a supinated gait.  MEDICATIONS, LABS & OTHER ORDERS: Previous Medications   ALBUTEROL (PROAIR HFA) 108 (90 BASE) MCG/ACT INHALER     Inhale 2 puffs into the lungs every 6 (six) hours as needed for wheezing or shortness of breath.   ASPIRIN (ASPIR-81) 81 MG EC TABLET    Take 81 mg by mouth daily.     CYCLOBENZAPRINE (FLEXERIL) 10 MG TABLET    Take 1 tablet (10 mg total) by mouth 2 (two) times daily.   EPINEPHRINE (EPI-PEN) 0.3 MG/0.3 ML DEVI    Inject 0.3 mLs (0.3 mg total) into the muscle once.   FEXOFENADINE (ALLEGRA) 180 MG TABLET    Take 180 mg by mouth daily.     FLUTICASONE (FLONASE) 50 MCG/ACT NASAL SPRAY    Place 2 sprays in each nostril daily   HYDROCHLOROTHIAZIDE (HYDRODIURIL) 25 MG TABLET    Take 1 tablet (25 mg total) by mouth daily.   LEVOTHYROXINE (SYNTHROID, LEVOTHROID) 112 MCG TABLET    Take 1 tablet (112 mcg total) by mouth daily.   LEVOTHYROXINE (SYNTHROID, LEVOTHROID) 112 MCG TABLET    TAKE ONE TABLET BY MOUTH ONCE DAILY   OMEPRAZOLE (PRILOSEC) 20 MG CAPSULE    Take 1 capsule (20 mg total) by mouth daily.   SIMVASTATIN (ZOCOR) 40 MG TABLET    Take 1 tablet (40 mg total) by mouth at bedtime.   TRAMADOL (ULTRAM) 50 MG TABLET    Take 1 tablet (50 mg total) by mouth 2 (two) times daily as needed. for pain   TRAZODONE (  DESYREL) 100 MG TABLET    Take 1 tablet (100 mg total) by mouth at bedtime.   ZOSTER VACCINE LIVE, PF, (ZOSTAVAX) 16109 UNT/0.65ML INJECTION    Inject 19,400 Units into the skin once.   Modified Medications   No medications on file   New Prescriptions   No medications on file   Discontinued Medications   No medications on file   No orders of the defined types were placed in this encounter.   ASSESSMENT & PLAN: Left lateral forefoot pain, improved significantly: Her MRI is reassuring that she does not have any bony change. It seems as though she likely overworked her left foot while moving 8 weeks ago. Her swelling has significantly improved with rest. We did place a scaphoid pad on her insles because she felt like she was walking on the outside of her foot and she does appear to have a very  cavus, supinated foot. Prior to leaving today she reported feeling much better and better supported with the scaphoid pads in place.. We did tell her to go easy getting back to her normal day-to-day activities but that the edema should slowly improve and may be hastened with compression. We are happy to see her back for any musculoskeletal issue. Call with any questions.  Guinevere Scarlet, M.D.

## 2015-11-17 ENCOUNTER — Encounter: Payer: Self-pay | Admitting: Family Medicine

## 2015-11-17 ENCOUNTER — Ambulatory Visit (INDEPENDENT_AMBULATORY_CARE_PROVIDER_SITE_OTHER): Payer: Medicare Other | Admitting: Family Medicine

## 2015-11-17 DIAGNOSIS — J309 Allergic rhinitis, unspecified: Secondary | ICD-10-CM | POA: Diagnosis not present

## 2015-11-17 MED ORDER — AZELASTINE HCL 0.1 % NA SOLN: 2.0000 | Freq: Two times a day (BID) | NASAL | Status: DC

## 2015-11-17 MED ORDER — FLUTICASONE PROPIONATE 50 MCG/ACT NA SUSP: 2.0000 | Freq: Every day | NASAL | Status: DC

## 2015-11-17 MED ORDER — LEVOFLOXACIN 500 MG PO TABS: 500.0000 mg | ORAL_TABLET | Freq: Every day | ORAL | Status: DC

## 2015-11-17 NOTE — Patient Instructions (Signed)
It was nice to meet you today.  Continue Allegra daily.  Flonase - take as we discussed in office, head down, opposite hand, spray away from the midline.  Start Asteline spray, sent to pharmacy.

## 2015-11-17 NOTE — Progress Notes (Signed)
   Subjective:    Patient ID: Brianna Wheeler, female    DOB: 09/15/1955, 60 y.o.   MRN: [id number]  HPI 60 y/o female presents for sinus pressure/congestion X 2.  Allergies - takig Flonase, Allegra, and nasal saline with minimal relief, associated nasal pressure, some subjective fevers, no nasal drainage, does have postnasal drip.    Review of Systems  Constitutional: Positive for fever.  HENT: Positive for postnasal drip, rhinorrhea, sinus pressure and sore throat. Negative for ear discharge, ear pain, facial swelling and nosebleeds.        Objective:   Physical Exam BP 128/88 mmHg  Pulse 79  Temp(Src) 98.3 F (36.8 C) (Oral)  Ht 5\' 11"  (1.803 m)  Wt 300 lb (136.079 kg)  BMI 41.86 kg/m2  SpO2 98%  Gen: pleasant female, NAD HEENT: normocephalic, PERRL, EOMI, bilateral TM's pearly grey, bilateral maxillary sinus tenderness, MMM, uvula midline, nasal septum midline, no rhinorrhea, no pharyngeal erythema or exudate noted, neck supple, no cervical adenopathy      Assessment & Plan:  Allergic rhinitis Patient has uncontrolled symptoms of allergic rhinitis. However, also has possible finding of acute bacterial sinusitis (increased maxillary tenderness).  -add Asteline to current regimen of Zyrtec and Flonase -counseled on proper use of Flonase -patient intolerant of Augmentin (GI upset), therefore will start Levaquin 500 mg X 5 days

## 2015-11-18 NOTE — Assessment & Plan Note (Signed)
Patient has uncontrolled symptoms of allergic rhinitis. However, also has possible finding of acute bacterial sinusitis (increased maxillary tenderness).  -add Asteline to current regimen of Zyrtec and Flonase -counseled on proper use of Flonase -patient intolerant of Augmentin (GI upset), therefore will start Levaquin 500 mg X 5 days

## 2015-11-21 ENCOUNTER — Encounter: Payer: Self-pay | Admitting: Student

## 2015-11-21 ENCOUNTER — Ambulatory Visit (INDEPENDENT_AMBULATORY_CARE_PROVIDER_SITE_OTHER): Payer: Medicare Other | Admitting: Student

## 2015-11-21 VITALS — BP 120/90 | HR 77 | Temp 98.2°F | Wt 295.9 lb

## 2015-11-21 DIAGNOSIS — M5440 Lumbago with sciatica, unspecified side: Secondary | ICD-10-CM

## 2015-11-21 MED ORDER — KETOROLAC TROMETHAMINE 30 MG/ML IJ SOLN
30.0000 mg | Freq: Once | INTRAMUSCULAR | Status: AC
  Administered 2015-11-21: 30 mg via INTRAVENOUS

## 2015-11-21 MED ORDER — KETOROLAC TROMETHAMINE 30 MG/ML IM SOLN: 30.0000 mg | Freq: Once | INTRAMUSCULAR | Status: DC

## 2015-11-21 NOTE — Assessment & Plan Note (Addendum)
Back pain with radiation to her right leg consistent with chronic back and sciatica pain with acute exacerbation after fall. No red flag symptoms -toradol 30 IM - will continue home tramadol and tylenol as needed for pain - ED and return precautions reviewed - return in one month to assess for improvement

## 2015-11-21 NOTE — Progress Notes (Signed)
Subjective:    Patient ID: Brianna Wheeler, female    DOB: Jun 30, 1956, 60 y.o.   MRN: [id number]   CC: fall  HPI: 60 y/o F with PMH of chronic back pain and stated history of sciatica presents for back pain after tripping over home equipment  Back pain - Larey Seat over home equipment her son was moving for her 3 days agio on 4/28.  - She hit her right side, denies head trauma or LOC - Since then she feels her chronic right sciatica pain is "acting up again"  and reports radiation of pain to her right knee - denies weakness, was able to walk after the fall - denies loss of bowel or bladder function  Smoking status reviewed  Review of Systems Otherwise denies headache, chest pain, SOB    Objective:  BP 120/90 mmHg  Pulse 77  Temp(Src) 98.2 F (36.8 C) (Oral)  Wt 295 lb 14.4 oz (134.219 kg) Vitals and nursing note reviewed  General: NAD Cardiac: RRR,  Respiratory: CTAB, normal effort Extremities: + straight leg test on right, neg on left 5/5 LE strength, no edema or cyanosis. Tenderness to palpation over lumbar back, no bony tenderness or palpable deformities Skin: warm and dry, no rashes noted Neuro: alert and oriented, no focal deficits   Assessment & Plan:    Lumbago Back pain with radiation to her right leg consistent with chronic pain with acute exacerbation after fall. No red flag symptoms -toradol 30 IM - will continue home tramadol and tylenol as needed for pain - ED and return precautions reviewed - return in one month to assess for improvement     Aleksia Freiman A. Kennon Rounds MD, MS Family Medicine Resident PGY-2 Pager [phone]

## 2015-11-21 NOTE — Patient Instructions (Signed)
Follow with PCP in one month Take tramadol and tylenol as needed for pain If you have worsening back pain or develop weakness go to the ED or call the office to be seen earlier If you have questions or concerns, call the office at [phone]

## 2015-11-21 NOTE — Addendum Note (Signed)
Addended by: Georges LynchSAUNDERS, SHARON T on: 11/21/2015 05:36 PM   Modules accepted: Orders

## 2015-11-24 ENCOUNTER — Encounter: Payer: Self-pay | Admitting: Internal Medicine

## 2015-11-24 ENCOUNTER — Ambulatory Visit (INDEPENDENT_AMBULATORY_CARE_PROVIDER_SITE_OTHER): Payer: Medicare Other | Admitting: Internal Medicine

## 2015-11-24 VITALS — BP 146/80 | HR 74 | Temp 98.0°F | Wt 297.7 lb

## 2015-11-24 DIAGNOSIS — M5441 Lumbago with sciatica, right side: Secondary | ICD-10-CM | POA: Diagnosis not present

## 2015-11-24 MED ORDER — CYCLOBENZAPRINE HCL 10 MG PO TABS: 10.0000 mg | ORAL_TABLET | Freq: Two times a day (BID) | ORAL | Status: DC

## 2015-11-24 MED ORDER — KETOROLAC TROMETHAMINE 30 MG/ML IJ SOLN
30.0000 mg | Freq: Once | INTRAMUSCULAR | Status: AC
  Administered 2015-11-24: 30 mg via INTRAMUSCULAR

## 2015-11-24 MED ORDER — KETOROLAC TROMETHAMINE 30 MG/ML IM SOLN: 30.0000 mg | Freq: Once | INTRAMUSCULAR | Status: DC

## 2015-11-24 NOTE — Patient Instructions (Signed)
[name],  Thank you for coming in today.  I expect you will be having improvement in your pain by the end of next week.   I have refilled your flexeril. You may want to try that instead of tramadol with your tylenol.  Please make an appointment at the end of next week if your pain has not improvement.  Best, Dr. Sampson GoonFitzgerald

## 2015-11-24 NOTE — Progress Notes (Signed)
   Subjective:    Patient ID: Brianna MannersBarbara F Wheeler, female    DOB: 12/06/1955, 60 y.o.   MRN: [id number]  HPI  Brianna RamusBarbara Wheeler is a 59-y.o. female with history of chronic back pain after an accident who presents for continued acute right-sided sciatic pain after fall to her right hip on 4/28 when she tripped over equipment in her living room (brought in by animal control to remove a copperhead and lizard that had gotten into her home).   She received a toradol shot on 11/21/15 that provided relief for 2-3 days. Pain has not gotten worse but has returned since getting toradol shot. It is still a burning type of pain that comes in goes with certain movements like straight leg raise and getting up from "low" furniture and starts in r. lower back and moves down her right outer thigh. It is improved by resting on her left side. She can walk without support but "has to taker [her] time." She has been taking her home tramadol, as well as tylenol, twice daily for pain. She denies bowel or bladder incontinence but says she has almost had an accident because it is taking her longer to walk to the bathroom.   Review of Systems  Genitourinary: Negative for difficulty urinating.  Neurological: Negative for dizziness, weakness, light-headedness and numbness.   Social: Never Smoker    Objective: Blood pressure 146/80, pulse 74, temperature 98 F (36.7 C), temperature source Oral, weight 297 lb 11.2 oz (135.036 kg).   Physical Exam  Constitutional:  Obese female, in no acute distress, resting on her left side on exam table.  Musculoskeletal:  Pain with right-sided straight leg raise. TTP over outer thigh. Observed ambulating with slight right-sided limp.   Skin: Skin is warm and dry. No rash noted.      Assessment & Plan:  Patient with continued right hip pain after fall less than 1 week ago. Has history of right sciatica. Disc space narrowing, especially at L4-L5, noted on X-ray of lumbar spine in 2014. Suspect  acute flare-up of sciatica 2/2 inflammation after fall. No symptoms of cauda equina syndrome.  Return in a week if no improvement in pain.  Lumbago - Recommended repeat toradol shot.  - Advised patient to avoid strenuous activities but not to be sedentary -- walk around home and property. - Refilled flexeril for patient to try instead of tramadol. - Counseled patient that I would expect this to resolve over next few weeks but that imaging (MRI for herniated disc) of her back may be needed if she does not have improvement by that time.    Dani GobbleHillary Fitzgerald, MD [facility], PGY-1

## 2015-11-25 NOTE — Assessment & Plan Note (Signed)
-   Recommended repeat toradol shot.  - Advised patient to avoid strenuous activities but not to be sedentary -- walk around home and property. - Refilled flexeril for patient to try instead of tramadol. - Counseled patient that I would expect this to resolve over next few weeks but that imaging (MRI for herniated disc) of her back may be needed if she does not have improvement by that time.

## 2015-11-28 ENCOUNTER — Telehealth: Payer: Self-pay | Admitting: *Deleted

## 2015-11-28 DIAGNOSIS — M5441 Lumbago with sciatica, right side: Secondary | ICD-10-CM

## 2015-11-28 MED ORDER — TRAMADOL HCL 50 MG PO TABS: 50.0000 mg | ORAL_TABLET | Freq: Two times a day (BID) | ORAL | Status: DC | PRN

## 2015-11-28 NOTE — Telephone Encounter (Signed)
Called and spoke to patient. Back pain was somewhat better yesterday but flared up after getting up from the sofa this morning, patient thinks because sofa has soft cushions. Pain has not worsened (still right-sided back pain that shoots down her leg) but has not improved. She is almost out of tramadol, as well. Reassured patient that I would not expect pain to entirely go away in 10 days. Will place order for lumbar x-ray and instructed patient to have performed if pain gets worse or if pain still present at the end of this month. Will place tramadol prescription in envelope at front desk.

## 2015-11-28 NOTE — Telephone Encounter (Signed)
Pt is calling because she is still having back pain and she wants to know what to do. She was last seen on 5/4. Please advise. Scotti Kosta Bruna PotterBlount, CMA

## 2015-11-30 ENCOUNTER — Ambulatory Visit
Admission: RE | Admit: 2015-11-30 | Discharge: 2015-11-30 | Disposition: A | Discharge status: Home / Self Care | Payer: Medicare Other | Source: Ambulatory Visit | Attending: Family Medicine | Admitting: Family Medicine

## 2015-11-30 ENCOUNTER — Telehealth: Payer: Self-pay | Admitting: Family Medicine

## 2015-11-30 DIAGNOSIS — M5441 Lumbago with sciatica, right side: Secondary | ICD-10-CM

## 2015-11-30 NOTE — Telephone Encounter (Signed)
Pt called to inform the doctor that she did have her x-ray and would like the doctor to go over the results with her. jw

## 2015-12-01 ENCOUNTER — Telehealth: Payer: Self-pay | Admitting: Internal Medicine

## 2015-12-01 NOTE — Telephone Encounter (Signed)
Called patient to inform her of x-ray results, which showed new compression fracture of lumbar spine. Offered to place referral to orthopedic surgery and explained possible need for MRI. Patient would prefer to wait a couple more weeks, as she is only 2 weeks from time of injury. Advised calcium and vitamin D supplementation, as she now has history of more than 1 compression fracture (new L3, old T11). She will call if pain worsens.

## 2015-12-01 NOTE — Telephone Encounter (Signed)
-----   Message from Uvaldo RisingKyle J Fletke, MD sent at 11/30/2015  2:40 PM EDT ----- Sherron MondayHillary,  I received this xray on a patient that you saw.  Dr. Carmon GinsbergF ----- Message -----    From: Rad Results In Interface    Sent: 11/30/2015   2:28 PM      To: Uvaldo RisingKyle J Fletke, MD

## 2015-12-06 ENCOUNTER — Ambulatory Visit: Admitting: Family Medicine

## 2015-12-07 ENCOUNTER — Encounter: Payer: Self-pay | Admitting: Family Medicine

## 2015-12-07 ENCOUNTER — Ambulatory Visit (INDEPENDENT_AMBULATORY_CARE_PROVIDER_SITE_OTHER): Payer: Medicare Other | Admitting: Family Medicine

## 2015-12-07 VITALS — BP 141/80 | HR 80 | Temp 98.2°F | Ht 71.0 in | Wt 293.7 lb

## 2015-12-07 DIAGNOSIS — M4850XD Collapsed vertebra, not elsewhere classified, site unspecified, subsequent encounter for fracture with routine healing: Secondary | ICD-10-CM

## 2015-12-07 DIAGNOSIS — M899 Disorder of bone, unspecified: Secondary | ICD-10-CM

## 2015-12-07 DIAGNOSIS — IMO0001 Reserved for inherently not codable concepts without codable children: Secondary | ICD-10-CM

## 2015-12-07 LAB — COMPLETE METABOLIC PANEL WITH GFR
ALT: 33 U/L — ABNORMAL HIGH (ref 6–29)
AST: 51 U/L — AB (ref 10–35)
Albumin: 3.8 g/dL (ref 3.6–5.1)
Alkaline Phosphatase: 105 U/L (ref 33–130)
BUN: 11 mg/dL (ref 7–25)
CHLORIDE: 99 mmol/L (ref 98–110)
CO2: 25 mmol/L (ref 20–31)
Calcium: 10.1 mg/dL (ref 8.6–10.4)
Creat: 0.87 mg/dL (ref 0.50–0.99)
GFR, Est African American: 83 mL/min (ref >=60)
GFR, Est Non African American: 72 mL/min (ref >=60)
GLUCOSE: 108 mg/dL — AB (ref 65–99)
POTASSIUM: 3.7 mmol/L (ref 3.5–5.3)
SODIUM: 136 mmol/L (ref 135–146)
Total Bilirubin: 0.7 mg/dL (ref 0.2–1.2)
Total Protein: 8.5 g/dL — ABNORMAL HIGH (ref 6.1–8.1)

## 2015-12-07 LAB — CBC
HCT: 41.6 % (ref 35.0–45.0)
Hemoglobin: 13.4 g/dL (ref 11.7–15.5)
MCH: 25.9 pg — ABNORMAL LOW (ref 27.0–33.0)
MCHC: 32.2 g/dL (ref 32.0–36.0)
MCV: 80.3 fL (ref 80.0–100.0)
MPV: 10.1 fL (ref 7.5–12.5)
PLATELETS: 240 10*3/uL (ref 140–400)
RBC: 5.18 MIL/uL — ABNORMAL HIGH (ref 3.80–5.10)
RDW: 16.8 % — AB (ref 11.0–15.0)
WBC: 5.1 10*3/uL (ref 3.8–10.8)

## 2015-12-07 LAB — PHOSPHORUS: PHOSPHORUS: 3.6 mg/dL (ref 2.5–4.5)

## 2015-12-07 MED ORDER — GABAPENTIN 300 MG PO CAPS: 300.0000 mg | ORAL_CAPSULE | Freq: Three times a day (TID) | ORAL | Status: DC | PRN

## 2015-12-07 NOTE — Progress Notes (Signed)
Date of Visit: 12/07/2015   HPI:  Patient presents to follow up on back pain.  -4/28 she fell in her home after tripping on something on the floor -5/1 Seen at [facility] on by Dr. Kennon RoundsHaney for back pain and had toradol shot with some relief.  -5/4 [facility] by Dr. Sampson GoonFitzgerald, underwent repeat toradol shot, given rx for flexeril.  -5/8 Called in with continued pain, xray ordered which showed new compression fracture of L3 vertebra with 20% loss of height.   Pain is overall improving. Still has lots of sciatica type symptoms, with pain radiating down her R leg. Using aspercreme without significant relief. Denies having fever, saddle anesthesia, lower extremity weakness, or problems with stooling or urination. Would like medical therapy to treat sciatica.  Last DEXA scan was in February 2012, with T score -1.0, normal. She takes calcium 600mg  and vit D 800 units daily.  ROS: See HPI.  PMFSH: history of hypothyroidism, type 2 diabetes, hyperlipidemia, hypertension, osteoarthritis, diaphragmatic hernia, seasonal allergies, depression, left eye cataract, transaminitis  PHYSICAL EXAM: BP 141/80 mmHg  Pulse 80  Temp(Src) 98.2 F (36.8 C) (Oral)  Ht 5\' 11"  (1.803 m)  Wt 293 lb 11.2 oz (133.221 kg)  BMI 40.98 kg/m2 Gen: NAD, pleasant, cooperative Back: mild tender to palpation lower back Extremities: full strength bilateral lower extremities. Sensation intact over bilateral lower extremities. 2+ patellar reflexes bilaterally.   ASSESSMENT/PLAN:  Vertebral compression fracture (HCC) Pain overall improving. For sciatica symptoms, will do trial of gabapentin 300mg  three times daily as needed. Counseled on risk of sedation with this medication. Needs evaluation for loss of bone density.  -Will repeat DEXA scan. -Check labs today: CMET, phosphorus, vitamin D, CBC Continue vit D & calcium supplementation. Follow up with PCP as scheduled in several  weeks   FOLLOW UP: Follow up in several weeks with PCP for compression fracture.  GrenadaBrittany J. Pollie MeyerMcIntyre, MD [facility]

## 2015-12-07 NOTE — Patient Instructions (Addendum)
Checking bloodwork today Getting bone density test  Continue calcium and vitamin D  Start gabapentin for the sciatica - sent this in for you Use caution as it might make you sleepy  Follow up with Dr. Mingo Brianna Wheeler in 3 weeks  Be well, Dr. Ardelia Mems   Spinal Compression Fracture A spinal compression fracture is a collapse of the bones that form the spine (vertebrae). With this type of fracture, the vertebrae become squashed (compressed) into a wedge shape. Most compression fractures happen in the middle or lower part of the spine. CAUSES This condition may be caused by:  Thinning and loss of density in the bones (osteoporosis). This is the most common cause.  A fall.  A car or motorcycle accident.  Cancer.  Trauma, such as a heavy, direct hit to the head. RISK FACTORS You may be at greater risk for a spinal compression fracture if you:  Are 32 years old or older.  Have osteoporosis.  Have certain types of cancer, including:  Multiple myeloma.  Lymphoma.  Prostate cancer.  Lung cancer.  Breast cancer. SYMPTOMS Symptoms of this condition include:  Severe pain.  Pain that gets worse over time.  Pain that is worse when you stand, walk, sit, or bend.  Sudden pain that is so bad that it is hard for you to move.  Bending or humping of the spine.  Gradual loss of height.  Numbness, tingling, or weakness in the back and legs.  Trouble walking. Your symptoms will depend on the cause of the fracture and how quickly it develops. For example, fractures that are caused by osteoporosis can cause few symptoms, no symptoms, or symptoms that develop slowly over time. DIAGNOSIS This condition may be diagnosed based on symptoms, medical history, and a physical exam. During the physical exam, your health care provider may tap along the length of your spine to check for tenderness. Tests may be done to confirm the diagnosis. They may include:  A bone density test to check for  osteoporosis.  Imaging tests, such as a spine X-ray, a CT scan, or MRI. TREATMENT Treatment for this condition depends on the cause and severity of the condition.Some fractures, such as those that are caused by osteoporosis, may heal on their own with supportive care. This may include:  Pain medicine.  Rest.  A back brace.  Physical therapy exercises.  Medicine that reduces bone pain.  Calcium and vitamin D supplements. Fractures that cause the back to become misshapen, cause nerve pain or weakness, or do not respond to other treatment may be treated with a surgical procedure, such as:  Vertebroplasty. In this procedure, bone cement is injected into the collapsed vertebrae to stabilize them.  Balloon kyphoplasty. In this procedure, the collapsed vertebrae are expanded with a balloon and then bone cement is injected into them.  Spinal fusion. In this procedure, the collapsed vertebrae are connected (fused) to normal vertebrae. HOME CARE INSTRUCTIONS General Instructions  Take medicines only as directed by your health care provider.  Do not drive or operate heavy machinery while taking pain medicine.  If directed, apply ice to the injured area:  Put ice in a plastic bag.  Place a towel between your skin and the bag.  Leave the ice on for 30 minutes every two hours at first. Then apply the ice as needed.  Wear your neck brace or back brace as directed by your health care provider.  Do not drink alcohol. Alcohol can interfere with your treatment.  Keep  all follow-up visits as directed by your health care provider. This is important. It can help to prevent permanent injury, disability, and long-lasting (chronic) pain. Activity  Stay in bed (on bed rest) only as directed by your health care provider. Being on bed rest for too long can make your condition worse.  Return to your normal activities as directed by your health care provider. Ask what activities are safe for  you.  Do exercises to improve motion and strength in your back (physical therapy), as recommended by your health care provider.  Exercise regularly as directed by your health care provider. SEEK MEDICAL CARE IF:  You have a fever.  You develop a cough that makes your pain worse.  Your pain medicine is not helping.  Your pain does not get better over time.  You cannot return to your normal activities as planned or expected. SEEK IMMEDIATE MEDICAL CARE IF:  Your pain is very bad and it suddenly gets worse.  You are unable to move any body part (paralysis) that is below the level of your injury.  You have numbness, tingling, or weakness in any body part that is below the level of your injury.  You cannot control your bladder or bowels.   This information is not intended to replace advice given to you by your health care provider. Make sure you discuss any questions you have with your health care provider.   Document Released: 07/09/2005 Document Revised: 11/23/2014 Document Reviewed: 07/13/2014 Elsevier Interactive Patient Education [employer].

## 2015-12-08 LAB — VITAMIN D 25 HYDROXY (VIT D DEFICIENCY, FRACTURES): Vit D, 25-Hydroxy: 29 ng/mL — ABNORMAL LOW (ref 30–100)

## 2015-12-09 DIAGNOSIS — M4850XA Collapsed vertebra, not elsewhere classified, site unspecified, initial encounter for fracture: Secondary | ICD-10-CM | POA: Insufficient documentation

## 2015-12-09 NOTE — Assessment & Plan Note (Addendum)
Pain overall improving. For sciatica symptoms, will do trial of gabapentin 300mg  three times daily as needed. Counseled on risk of sedation with this medication. Needs evaluation for loss of bone density.  -Will repeat DEXA scan. -Check labs today: CMET, phosphorus, vitamin D, CBC Continue vit D & calcium supplementation. Follow up with PCP as scheduled in several weeks

## 2015-12-14 NOTE — Progress Notes (Signed)
Addendum for billing purposes Vit D diagnosis code is M89.9 (disorder of bone, unspecified)

## 2015-12-20 ENCOUNTER — Encounter: Payer: Self-pay | Admitting: Family Medicine

## 2015-12-20 ENCOUNTER — Ambulatory Visit (INDEPENDENT_AMBULATORY_CARE_PROVIDER_SITE_OTHER): Payer: Medicare Other | Admitting: Family Medicine

## 2015-12-20 VITALS — BP 137/72 | HR 83 | Temp 98.1°F | Ht 71.0 in | Wt 296.4 lb

## 2015-12-20 DIAGNOSIS — IMO0001 Reserved for inherently not codable concepts without codable children: Secondary | ICD-10-CM

## 2015-12-20 DIAGNOSIS — M4850XD Collapsed vertebra, not elsewhere classified, site unspecified, subsequent encounter for fracture with routine healing: Secondary | ICD-10-CM

## 2015-12-20 DIAGNOSIS — E119 Type 2 diabetes mellitus without complications: Secondary | ICD-10-CM | POA: Diagnosis not present

## 2015-12-20 LAB — POCT GLYCOSYLATED HEMOGLOBIN (HGB A1C): Hemoglobin A1C: 6

## 2015-12-20 MED ORDER — KETOROLAC TROMETHAMINE 30 MG/ML IJ SOLN
30.0000 mg | Freq: Once | INTRAMUSCULAR | Status: AC
  Administered 2015-12-20: 30 mg via INTRAMUSCULAR

## 2015-12-20 NOTE — Progress Notes (Signed)
Subjective:    Brianna MannersBarbara F Wheeler is a 60 y.o. female who presents [facility] today for compression fracture:  1.  Compression fracture:  Feels MUCH better.  In public, she is still using cane to ambulate, though inconsistently.  Not using at home.  Took Immunologisttrash compactor to curb yesterday.  Has decreased use of her pain medication.  Still has sciatica type symptoms of paresthesias in buttock to thigh on Right side, but this has also improved.  No further falls.   ROS as above per HPI, otherwise neg.   The following portions of the patient's history were reviewed and updated as appropriate: allergies, current medications, past medical history, family and social history, and problem list. Patient is a nonsmoker.    PMH reviewed.  Past Medical History  Diagnosis Date  . Hypertension   . Hyperlipidemia   . Diabetes mellitus   . Left foot pain 2005    Chronic injury -- multiple broken bones and torn ligaments after accident in garage   Past Surgical History  Procedure Laterality Date  . Tubal ligation      Medications reviewed.    Objective:   Physical Exam BP 137/72 mmHg  Pulse 83  Temp(Src) 98.1 F (36.7 C) (Oral)  Ht 5\' 11"  (1.803 m)  Wt 296 lb 6.4 oz (134.446 kg)  BMI 41.36 kg/m2 Gen:  Alert, cooperative patient who appears stated age in no acute distress.  Vital signs reviewed. HEENT: EOMI,  MMM Back:  Nontender except to deep palpation right lumbar. MSK:  Good internal/external hip rotation BL. Neuro:  Sensation/strength intact Right leg  No results found for this or any previous visit (from the past 72 hour(s)).

## 2015-12-20 NOTE — Patient Instructions (Signed)
It was good to see you again today.  Let me know if you start having any pain in your hip or buttocks worsens.  Toradol shot today for pain relief.  I'll see back in about 4-6 weeks to make sure that you're still doing okay.

## 2015-12-21 ENCOUNTER — Inpatient Hospital Stay: Admission: RE | Admit: 2015-12-21 | Source: Ambulatory Visit

## 2015-12-22 NOTE — Assessment & Plan Note (Signed)
Patient feels much better.  Declined any further orthopedic intervention. Continue back strengthening exercises. Dexa scan scheduled later this month. Continue increasing walking/activity as tolerated.  On vit D-calcium supplement.   Recheck in 4 - 6 weeks.  Continue analgesia as needed.

## 2015-12-27 ENCOUNTER — Telehealth: Payer: Self-pay | Admitting: Family Medicine

## 2015-12-27 NOTE — Telephone Encounter (Signed)
Has been taking Centrum mulit vitamin.  She has also been taking calcium and vitamin d. She wants to know if she is suppose to be taking all three now.  Please advise

## 2015-12-27 NOTE — Telephone Encounter (Signed)
Contacted pt and she is taking Centrum Silver Women +50, as well as 600 calcium and 800 IU Vit D-3.  Does she need to take all of this she said. Lamonte SakaiZimmerman Rumple, Dontavian Marchi D, [place]

## 2015-12-28 NOTE — Telephone Encounter (Signed)
Patient informed. Pt said she is still in pain. If it doesn't get any better in the next couple of days she will call for another xray.  Sunday SpillersSharon T Saunders, CMA

## 2015-12-28 NOTE — Telephone Encounter (Signed)
Nope just the calcium and vitamin D.  Thanks!

## 2016-01-02 NOTE — Telephone Encounter (Signed)
Patient called back and states that she is still having a lot of pain in her back and hip.  Would like to know if she can go ahead and get these ordered so she can get them done today.  I informed her that pcp was out this week and that I would send message to covering provider. Jazmin Hartsell,CMA

## 2016-01-09 ENCOUNTER — Other Ambulatory Visit: Payer: Self-pay | Admitting: Family Medicine

## 2016-01-09 NOTE — Telephone Encounter (Signed)
RN staff - please call patient back and inform her that I am not able to order xrays without evaluating her. Dr. Gwendolyn GrantWalden will return 6/21. She can wait for him to return and see if he will order xrays. May also offer same day appointment.

## 2016-01-09 NOTE — Telephone Encounter (Signed)
Patient scheduled this week for a same day appt. Jamone Garrido,CMA

## 2016-01-11 ENCOUNTER — Encounter: Payer: Self-pay | Admitting: Family Medicine

## 2016-01-11 ENCOUNTER — Ambulatory Visit (INDEPENDENT_AMBULATORY_CARE_PROVIDER_SITE_OTHER): Payer: Medicare Other | Admitting: Family Medicine

## 2016-01-11 VITALS — BP 129/69 | HR 82 | Temp 98.0°F | Resp 20 | Wt 298.2 lb

## 2016-01-11 DIAGNOSIS — M4850XD Collapsed vertebra, not elsewhere classified, site unspecified, subsequent encounter for fracture with routine healing: Secondary | ICD-10-CM

## 2016-01-11 DIAGNOSIS — M5441 Lumbago with sciatica, right side: Secondary | ICD-10-CM

## 2016-01-11 DIAGNOSIS — IMO0001 Reserved for inherently not codable concepts without codable children: Secondary | ICD-10-CM

## 2016-01-11 DIAGNOSIS — M150 Primary generalized (osteo)arthritis: Secondary | ICD-10-CM

## 2016-01-11 DIAGNOSIS — G8929 Other chronic pain: Secondary | ICD-10-CM | POA: Diagnosis not present

## 2016-01-11 DIAGNOSIS — M159 Polyosteoarthritis, unspecified: Secondary | ICD-10-CM

## 2016-01-11 MED ORDER — PREDNISONE 10 MG PO TABS: ORAL_TABLET | ORAL | Status: DC

## 2016-01-11 MED ORDER — TRAMADOL HCL 50 MG PO TABS: 50.0000 mg | ORAL_TABLET | Freq: Two times a day (BID) | ORAL | Status: DC | PRN

## 2016-01-11 NOTE — Patient Instructions (Signed)
Thank you for coming in to clinic today.  1. For your Back Pain - I think that this is due to Compression Fracture (from previous fall injury). Also your Sciatic Nerve can be affected causing some of your radiation and numbness down your legs.  Refilled Tramadol  - Start Prednisone taper (steroid anti-inflammatory) for nerve irritation with pain in legs. Each pill is 10mg . Take 6 pills (60mg  daily) for 1 day at same time with breakfast, then take 5 pills (50mg ) for 1 day, then 4 pills next day, then 3, then 2, then 1 on last day.  May use Tylenol Extra Str 500mg  tabs - may take 1-2 tablets every 6 hours as needed  Recommend to start using heating pad on your lower back 1-2x daily for few weeks  This pain may take weeks to months to fully resolve, but hopefully it will respond to the medicine initially. All back injuries (small or serious) are slow to heal since we use our back muscles every day. Be careful with turning, twisting, lifting, sitting / standing for prolonged periods, and avoid re-injury.  If your symptoms significantly worsen with more pain, or new symptoms with weakness in one or both legs, new or different shooting leg pains, numbness in legs or groin, loss of control or retention of urine or bowel movements, please call back for advice and you may need to go directly to the Emergency Department.  Ordered X-rays repeat of Low Back - will call with results later this week  Please call them to schedule follow-up to check in. [facility] Reinaldo Meekerandy O. Kritzer, MD ? Address: [address], [place], [place] [address] Phone: [phone]  Please schedule a follow-up appointment with Dr Gwendolyn GrantWalden within 3-4 weeks to follow-up if persistent Low Back Pain  If you have any other questions or concerns, please feel free to call the clinic to contact me. You may also schedule an earlier appointment if necessary.  However, if your symptoms get significantly worse,  please go to the Emergency Department to seek immediate medical attention.  Saralyn PilarAlexander Shikara Mcauliffe, DO [facility]

## 2016-01-11 NOTE — Progress Notes (Signed)
Subjective:    Patient ID: Brianna Wheeler, female    DOB: 08-23-1955, 59 y.o.   MRN: [id number]  Brianna Wheeler is a 60 y.o. female presenting on 01/11/2016 for Back Pain   Patient presents for a same day appointment.   HPI   ACUTE on CHRONIC LOW BACK PAIN, S/p Traumatic Fall 1 month ago - Chronic known history of LBP with OA/DJD prior history of bulging and herniated discs by report. Previously followed by [facility] (Dr Gerlene Fee) has been told that no longer indicated for any back surgery. - Today presents for follow-up on persistent Low back pain with R-sided sciatica intermittently, initial injury on 11/18/15 with fall at home, see detailed note for office visit at that time on 11/24/15. She has been seen about 4 times in 11/2015 for this complaint, has had trial on NSAIDs, Gabapentin for sciatica, and recently had Lumbar spine X-rays 11/30/15 showed new L3 compression fracture, with known DJD narrowing disc space L4-5 and chronic compression frx T11, some facet arthropathy - Describes persistent pain in mid to right low back, occasional radiations down Right leg, worse with activity changing positions, excessive standing or walking, improves with rest. Improved with Tramadol 50mg  BID (chronc med for her, requesting refill today), also was advised that we would repeat low back x-rays today. - Denies any new trauma or fall, fevers/chills, numbness, tingling, weakness, loss of control bladder/bowel incontinence or retention, unintentional wt loss, night sweats   Social History  Substance Use Topics  . Smoking status: Never Smoker   . Smokeless tobacco: Never Used  . Alcohol Use: No    Review of Systems Per HPI unless specifically indicated above     Objective:    BP 129/69 mmHg  Pulse 82  Temp(Src) 98 F (36.7 C) (Oral)  Resp 20  Wt 298 lb 3.2 oz (135.263 kg)  SpO2 98%  Wt Readings from Last 3 Encounters:  01/11/16 298 lb 3.2 oz (135.263 kg)  12/20/15 296 lb  6.4 oz (134.446 kg)  12/07/15 293 lb 11.2 oz (133.221 kg)    Physical Exam  Constitutional: She appears well-developed and well-nourished. No distress.  Chronically ill appearing, obese, mild discomfort with low back pain, otherwise cooperative  HENT:  Head: Normocephalic and atraumatic.  Neck: Normal range of motion.  Cardiovascular: Normal rate and intact distal pulses.   Pulmonary/Chest: Effort normal.  Musculoskeletal: She exhibits no edema.  Low Back Inspection: Normal appearance, Large body habitus, no spinal deformity, symmetrical. Palpation: Mild tenderness directly over lumbar spine and bilateral lumbar paraspinal muscles difficult to determine spasm given large body habitus ROM: Slightly limited forward flexion with some LBP Special Testing: Seated SLR negative for radicular pain bilaterally  Strength: Bilateral hip flex/ext 5/5, knee flex/ext 5/5, ankle dorsiflex/plantarflex 5/5 Neurovascular: intact distal sensation to light touch   Neurological: She is alert.  Skin: Skin is warm and dry. She is not diaphoretic.  Nursing note and vitals reviewed.  Results for orders placed or performed in visit on 12/20/15  POCT A1C  Result Value Ref Range   Hemoglobin A1C 6.0       Assessment & Plan:   Problem List Items Addressed This Visit    Vertebral compression fracture (HCC) - Primary   Relevant Medications   traMADol (ULTRAM) 50 MG tablet   Other Relevant Orders   DG Lumbar Spine Complete   Osteoarthritis (Chronic)   Relevant Medications   traMADol (ULTRAM) 50 MG tablet   predniSONE (DELTASONE)  10 MG tablet   Other Relevant Orders   DG Lumbar Spine Complete   Lumbago   Relevant Medications   traMADol (ULTRAM) 50 MG tablet   predniSONE (DELTASONE) 10 MG tablet      Subacute on chronic R LBP with associated R sciatica. Suspect likely due recent L3 compression fracture and related muscle spasm / MSK low back injury with traumatic fall, in setting of known chronic LBP  with DJD,  - No red flag symptoms. Negative SLR for radiculopathy - Not responding to conservative therapy  Plan: 1. Start anti-inflammatory with prednisone taper x 6 days (60mg  down to 10mg , over 6 days) 2. Refilled Tramadol 50mg  BID PRN #60, +2 refills, chronic med 3. May use Tylenol PRN for breakthrough 4. Encouraged use of heating pad 1-2x daily for now then PRN 5. Ordered repeat Lumbar x-rays to follow-up compression fracture, continue Vitamin D-calcium supplement 6. Follow-up already scheduled with PCP within 2 weeks, also given contact info for [facility] Gerlene Fee to follow-up   Meds ordered this encounter  Medications  . traMADol (ULTRAM) 50 MG tablet    Sig: Take 1 tablet (50 mg total) by mouth 2 (two) times daily as needed. for pain    Dispense:  60 tablet    Refill:  2  . predniSONE (DELTASONE) 10 MG tablet    Sig: Take 6 tabs with breakfast Day 1, 5 tabs Day 2, 4 tabs Day 3, 3 tabs Day 4, 2 tabs Day 5, 1 tab Day 6.    Dispense:  21 tablet    Refill:  0      Follow up plan: Return in about 4 weeks (around 02/08/2016) for low back pain, compression fracture.  Saralyn Pilar, DO [facility], PGY-3

## 2016-01-12 ENCOUNTER — Ambulatory Visit
Admission: RE | Admit: 2016-01-12 | Discharge: 2016-01-12 | Disposition: A | Discharge status: Home / Self Care | Source: Ambulatory Visit | Attending: Family Medicine | Admitting: Family Medicine

## 2016-01-12 ENCOUNTER — Telehealth: Payer: Self-pay | Admitting: Family Medicine

## 2016-01-12 DIAGNOSIS — M150 Primary generalized (osteo)arthritis: Secondary | ICD-10-CM

## 2016-01-12 DIAGNOSIS — M4850XD Collapsed vertebra, not elsewhere classified, site unspecified, subsequent encounter for fracture with routine healing: Principal | ICD-10-CM

## 2016-01-12 DIAGNOSIS — M159 Polyosteoarthritis, unspecified: Secondary | ICD-10-CM

## 2016-01-12 DIAGNOSIS — IMO0001 Reserved for inherently not codable concepts without codable children: Secondary | ICD-10-CM

## 2016-01-12 NOTE — Telephone Encounter (Signed)
Last office visit 01/11/16 for chronic LBP, see note for details. Obtained repeat Lumbar x-ray, results below for details. Essentially stable L3 compression fracture considered subacute, no worsening or new fracture. Bilateral facet arthropathy L4-L5, otherwise no other problems. Called patient to review results, she was updated and essentially stable Lumbar x-ray, and stated her pain was significantly better after starting prednisone, will finish course and continue to follow-up as planned.  Lumbar X-ray (01/12/16) compared to 11/30/15 FINDINGS: There are 5 nonrib bearing lumbar-type vertebral bodies. There is an L3 vertebral body compression fracture with approximately 30% height loss anteriorly. There is no significant interval change compared with 11/30/2015. The remainder the vertebral body heights are maintained. There is no spondylolysis. There is 5 mm of anterolisthesis of L4 on L5 secondary to bilateral facet arthropathy. There is no acute fracture. The disc spaces are maintained. The SI joints are unremarkable. IMPRESSION: 1. Stable subacute L3 vertebral body compression fracture.  Saralyn PilarAlexander Ela Moffat, DO [facility], PGY-3

## 2016-01-19 ENCOUNTER — Ambulatory Visit
Admission: RE | Admit: 2016-01-19 | Discharge: 2016-01-19 | Disposition: A | Discharge status: Home / Self Care | Payer: Medicare Other | Source: Ambulatory Visit | Attending: Family Medicine | Admitting: Family Medicine

## 2016-01-19 DIAGNOSIS — IMO0001 Reserved for inherently not codable concepts without codable children: Secondary | ICD-10-CM

## 2016-01-19 DIAGNOSIS — M4850XD Collapsed vertebra, not elsewhere classified, site unspecified, subsequent encounter for fracture with routine healing: Principal | ICD-10-CM

## 2016-01-20 ENCOUNTER — Encounter: Payer: Self-pay | Admitting: Family Medicine

## 2016-01-27 ENCOUNTER — Encounter: Payer: Self-pay | Admitting: Family Medicine

## 2016-01-27 ENCOUNTER — Telehealth: Payer: Self-pay | Admitting: *Deleted

## 2016-01-27 ENCOUNTER — Ambulatory Visit (INDEPENDENT_AMBULATORY_CARE_PROVIDER_SITE_OTHER): Payer: Medicare Other | Admitting: Family Medicine

## 2016-01-27 VITALS — BP 141/60 | HR 74 | Temp 98.2°F | Ht 71.0 in | Wt 303.4 lb

## 2016-01-27 DIAGNOSIS — Z1211 Encounter for screening for malignant neoplasm of colon: Secondary | ICD-10-CM | POA: Diagnosis not present

## 2016-01-27 DIAGNOSIS — M4850XD Collapsed vertebra, not elsewhere classified, site unspecified, subsequent encounter for fracture with routine healing: Secondary | ICD-10-CM

## 2016-01-27 DIAGNOSIS — IMO0001 Reserved for inherently not codable concepts without codable children: Secondary | ICD-10-CM

## 2016-01-27 MED ORDER — CELECOXIB 100 MG PO CAPS: 100.0000 mg | ORAL_CAPSULE | Freq: Two times a day (BID) | ORAL | Status: DC | PRN

## 2016-01-27 NOTE — Progress Notes (Signed)
Subjective:    Brianna Wheeler is a 60 y.o. female who [facility] for [facility] pain:  1.  Back pain:  Had to climb into a SUV last night.  States this exacerbated her pain. Otherwise she's been doing much better.  She is able to go for walks now. She only rarely uses a cane and that's more for psychological comfort being out in a crowd that at home. However since stepping into the car last night she had a little bit worsening of her pain and therefore is using a cane today. She is continue taking tramadol, vitamin D/calcium supplementation. She is asking if there are any other NSAIDs available for her to try.   Prev health:  Overdue for Colonoscopy. She declines this today and would rather do stool cards.  ROS as above per HPI, otherwise neg.    The following portions of the patient's history were reviewed and updated as appropriate: allergies, current medications, past medical history, family and social history, and problem list. Patient is a nonsmoker.    PMH reviewed.  Past Medical History  Diagnosis Date  . Hypertension   . Hyperlipidemia   . Diabetes mellitus   . Left foot pain 2005    Chronic injury -- multiple broken bones and torn ligaments after accident in garage   Past Surgical History  Procedure Laterality Date  . Tubal ligation      Medications reviewed.    Objective:   Physical Exam There were no vitals taken for this visit. Gen:  Alert, cooperative patient who appears stated age in no acute distress.  Vital signs reviewed. HEENT: EOMI,  MMM Back:  Nontender to exam.  Still some decreased forward flexion due to pain, but better than before.  Ambulating with cane.   No results found for this or any previous visit (from the past 72 hour(s)).

## 2016-01-27 NOTE — Assessment & Plan Note (Signed)
Pain still much better.   With slight exacerbation from yesterday. However she has no pain on examination. We can try Celebrex today. She's had trouble with indigestion and epigastric pain with previous NSAIDs.  Warning precautions to stop if this at all recurs. Continue tramadol and vitamin D supplementation FU in 1 month

## 2016-01-27 NOTE — Telephone Encounter (Signed)
Called and discussed with Brianna Wheeler.  No cheaper alternatives exist.  As there was a small risk this would cause gastritis, will hold off on any COX inhibitors.  She expressed understanding.

## 2016-01-27 NOTE — Patient Instructions (Addendum)
Make sure to bring the stool cards back.  I'll let you know what they showed.    Keep taking the Tramadol and vitamin D-calcium.  It was good to see you today!

## 2016-01-27 NOTE — Telephone Encounter (Signed)
Patient states that the celebrex that Dr. Gwendolyn GrantWalden called in today is $140 and her insurance wont cover it. Wants to know if MD can send in something cheaper.

## 2016-02-28 ENCOUNTER — Encounter: Payer: Self-pay | Admitting: Family Medicine

## 2016-02-28 ENCOUNTER — Ambulatory Visit (INDEPENDENT_AMBULATORY_CARE_PROVIDER_SITE_OTHER): Payer: Medicare Other | Admitting: Family Medicine

## 2016-02-28 VITALS — BP 123/75 | HR 83 | Temp 98.4°F | Ht 71.0 in | Wt 306.8 lb

## 2016-02-28 DIAGNOSIS — M4850XD Collapsed vertebra, not elsewhere classified, site unspecified, subsequent encounter for fracture with routine healing: Secondary | ICD-10-CM | POA: Diagnosis not present

## 2016-02-28 DIAGNOSIS — J309 Allergic rhinitis, unspecified: Secondary | ICD-10-CM

## 2016-02-28 DIAGNOSIS — M545 Low back pain: Secondary | ICD-10-CM | POA: Diagnosis not present

## 2016-02-28 DIAGNOSIS — IMO0001 Reserved for inherently not codable concepts without codable children: Secondary | ICD-10-CM

## 2016-02-28 DIAGNOSIS — G8929 Other chronic pain: Secondary | ICD-10-CM | POA: Diagnosis not present

## 2016-02-28 DIAGNOSIS — M5441 Lumbago with sciatica, right side: Secondary | ICD-10-CM

## 2016-02-28 MED ORDER — HYDROCHLOROTHIAZIDE 25 MG PO TABS: 25.0000 mg | ORAL_TABLET | Freq: Every day | ORAL | 2 refills | Status: DC

## 2016-02-28 MED ORDER — TRAMADOL HCL 50 MG PO TABS: 50.0000 mg | ORAL_TABLET | Freq: Two times a day (BID) | ORAL | 2 refills | Status: DC | PRN

## 2016-02-28 NOTE — Assessment & Plan Note (Signed)
She is having pain in her ears. Likely that eustachian tube dysfunction exacerbated by her allergic rhinitis. She can try Allegra-D to help with as a decongestant. -Didn't see anything else on exam. Follow-up 2 to 3 weeks if no improvement.

## 2016-02-28 NOTE — Patient Instructions (Addendum)
Try the Allegra D to help with you ear.  This should help with the pressure.  Refill for Tramadol today.    Blood pressure refill today.    I will refer you to physical therapy.

## 2016-02-28 NOTE — Progress Notes (Signed)
Subjective:    Brianna Wheeler is a 60 y.o. female who [facility] for ear pain:  1.  Ear pain:  Left ear pain.  Started yesterday.  No trouble hearing.  Wonders if she got water in it.  No fevers or chills.  Has had a little bit of runny nose and cough for past several days as well.  Also with itching in her ear.    2.  Back pain:  Persists.  Occasionally better.  Tramadol does help with pain relief. No bladder bowel incontinence. She is now starting to worry about her balance and is no longer walking outside for exercise. Continues to use cane for balance/ambulation.   ROS as above per HPI, otherwise neg.    The following portions of the patient's history were reviewed and updated as appropriate: allergies, current medications, past medical history, family and social history, and problem list. Patient is a nonsmoker.    PMH reviewed.  Past Medical History:  Diagnosis Date  . Diabetes mellitus   . Hyperlipidemia   . Hypertension   . Left foot pain 2005   Chronic injury -- multiple broken bones and torn ligaments after accident in garage   Past Surgical History:  Procedure Laterality Date  . TUBAL LIGATION      Medications reviewed.    Objective:   Physical Exam BP 123/75   Pulse 83   Temp 98.4 F (36.9 C) (Oral)   Ht 5\' 11"  (1.803 m)   Wt (!) 306 lb 12.8 oz (139.2 kg)   BMI 42.79 kg/m  Gen:  Alert, cooperative patient who appears stated age in no acute distress.  Vital signs reviewed. HEENT: EOMI,  MMM. Bilateral ears are clear auditory canals. No tympanic membrane redness MSK:  Tender palpation bilateral thoracic region. Mild to moderate tenderness.   No results found for this or any previous visit (from the past 72 hour(s)).

## 2016-02-28 NOTE — Assessment & Plan Note (Signed)
Discussion today about potential referral to orthopedics since she has been having pain for about 3 months now. She would rather start physical therapy and see she has improvement. She does have improvement then she would consider referral to orthopedics but declines this today.

## 2016-03-06 ENCOUNTER — Ambulatory Visit: Payer: Medicare Other | Attending: Family Medicine | Admitting: Physical Therapy

## 2016-03-06 DIAGNOSIS — R293 Abnormal posture: Secondary | ICD-10-CM | POA: Insufficient documentation

## 2016-03-06 DIAGNOSIS — M6281 Muscle weakness (generalized): Secondary | ICD-10-CM | POA: Diagnosis present

## 2016-03-06 DIAGNOSIS — M5441 Lumbago with sciatica, right side: Secondary | ICD-10-CM | POA: Insufficient documentation

## 2016-03-06 DIAGNOSIS — R262 Difficulty in walking, not elsewhere classified: Secondary | ICD-10-CM | POA: Diagnosis present

## 2016-03-06 NOTE — Therapy (Signed)
[facility] [address] [place], [place], [address] Phone: [phone]   Fax:  [phone]  Physical Therapy Evaluation  Patient Details  Name: Brianna Wheeler MRN: [id number] Date of Birth: 26-Oct-1955 Referring Provider: Payton Mccallum, MD   Encounter Date: 03/06/2016      PT End of Session - 03/06/16 1513    Visit Number 1   Number of Visits 16   Date for PT Re-Evaluation 05/01/16   PT Start Time 1416   PT Stop Time 1500   PT Time Calculation (min) 44 min   Activity Tolerance Patient tolerated treatment well   Behavior During Therapy [facility] for tasks assessed/performed      Past Medical History:  Diagnosis Date  . Diabetes mellitus   . Hyperlipidemia   . Hypertension   . Left foot pain 2005   Chronic injury -- multiple broken bones and torn ligaments after accident in garage    Past Surgical History:  Procedure Laterality Date  . TUBAL LIGATION      There were no vitals filed for this visit.       Subjective Assessment - 03/06/16 1424    Subjective Patient reports pain in low back increased since April 2017 when she tripped and fell in her home. For 2 weeks she was unable to walk, XR revealed compression fracture.   She reports difficulty walking, bending, squatting, sitting or standing too long.    Patient is accompained by: Family member  son    Pertinent History Lumbar fusion 1995 (fall at work) , DM, HTN, HLD, sciatica    Limitations Standing;Walking;House hold activities;Lifting;Sitting   How long can you sit comfortably? OK on a good chair    How long can you stand comfortably? 30 min max   How long can you walk comfortably? walking has improved, legs feel better when she walks.     Diagnostic tests Lumbar spine L3 compression fracture recently re-done and confirmed stable compression fx with arthropathy throughout.     Patient Stated Goals improve confidence, get in the bathtub, walk without cane    Currently in Pain? Yes   Pain Score 4   premedicated   Pain Location Back   Pain Orientation Lower   Pain Descriptors / Indicators Sharp;Sore;Radiating;Aching   Pain Type Chronic pain   Pain Radiating Towards Rt. LE    Pain Onset More than a month ago   Pain Frequency Intermittent   Aggravating Factors  activity, poor seating    Pain Relieving Factors lying down, rest, Tramadol    Effect of Pain on Daily Activities does not like being dependent             [facility] PT Assessment - 03/06/16 1433      Assessment   Medical Diagnosis chronic bilateral low back pain    Referring Provider Payton Mccallum, MD    Onset Date/Surgical Date 11/17/15   Prior Therapy Yes after surgery, [place]     Precautions   Precautions None     Restrictions   Weight Bearing Restrictions No     Balance Screen   Has the patient fallen in the past 6 months Yes   How many times? 1   Has the patient had a decrease in activity level because of a fear of falling?  Yes   Is the patient reluctant to leave their home because of a fear of falling?  No     Home Tourist information centre manager  residence   Living Arrangements Children   Type of Home House   Home Access Ramped entrance   Home Layout One level   Home Equipment Dexter - single point;Shower seat     Prior Function   Level of Independence Independent     Cognition   Overall Cognitive Status Within Functional Limits for tasks assessed   Behaviors --  anxious     Observation/Other Assessments   Focus on Therapeutic Outcomes (FOTO)  51%     Sensation   Light Touch Appears Intact     Coordination   Gross Motor Movements are Fluid and Coordinated Not tested     Posture/Postural Control   Posture/Postural Control Postural limitations   Postural Limitations Rounded Shoulders;Forward head;Decreased lumbar lordosis;Posterior pelvic tilt     AROM   Lumbar Flexion NT   Lumbar Extension 25%   Lumbar - Right Side Bend 75% pain     Lumbar - Left Side Bend 75% pain    Lumbar - Right Rotation WFL   Lumbar - Left Rotation WFL     Strength   Right Hip Flexion 3-/5   Right Hip ABduction 3-/5   Left Hip Flexion 3-/5   Left Hip ABduction 3-/5   Right/Left Knee --  [facility]    Right/Left Ankle --  DF 5/5      Palpation   Palpation comment pt reports swelling but unable to detect on eval     Special Tests    Special Tests --  Neg SLR      Transfers   Comments good technique of sit to supine to sit      Ambulation/Gait   Assistive device Straight cane   Gait Pattern Trendelenburg;Antalgic;Lateral hip instability;Lateral trunk lean to left   Ambulation Surface Level;Indoor                   [facility] Adult PT Treatment/Exercise - 03/06/16 1433      Self-Care   Self-Care Posture;Other Self-Care Comments   Posture hip hinge and avoiding bending to pull weeds.    Other Self-Care Comments  HEP      Lumbar Exercises: Stretches   Active Hamstring Stretch 2 reps;30 seconds     Knee/Hip Exercises: Standing   Hip Abduction Stengthening;Both;1 set;10 reps   Functional Squat 10 reps   Functional Squat Limitations mini     Knee/Hip Exercises: Supine   Straight Leg Raises Strengthening;Both;1 set;10 reps                PT Education - 03/06/16 1458    Education provided Yes   Education Details HEP, lifting, squatting and POC , avoid bending , OK to walk    Person(s) Educated Patient   Methods Explanation;Demonstration   Comprehension Verbalized understanding;Returned demonstration          PT Short Term Goals - 03/06/16 1526      PT SHORT TERM GOAL #1   Title Pt will be I with HEP (initial) for hip and trunk flexibility, strength    Time 4   Period Weeks   Status New     PT SHORT TERM GOAL #2   Title Pt will be able to walk in her yard for 15-20 min each day for exercise and have min increase in low back pain    Time 4   Period Weeks   Status New     PT SHORT TERM GOAL #3   Title Pt  will be able to perform  sit to stand without use of hands from most chairs to demo functional strength .    Time 4   Period Weeks     PT SHORT TERM GOAL #4   Title Pt will complete balance screen and set goal, understand results.    Time 4   Period Weeks   Status New           PT Long Term Goals - March 22, 2016 1528      PT LONG TERM GOAL #1   Title Pt will score <40% limited on FOTO to demo improvement in function.    Time 8   Period Weeks   Status New     PT LONG TERM GOAL #2   Title Pt will be able to walk without her cane 50% of the time and report improved confidence    Time 8   Period Weeks   Status New     PT LONG TERM GOAL #3   Title Pt will be I with more advanced HEP.    Time 8   Period Weeks     PT LONG TERM GOAL #4   Title Balance goal to be determined.    Time 8   Period Weeks   Status New     PT LONG TERM GOAL #5   Title Pt will be able to do housework, yardwork while practicing good body mechanics to preserve back.     Time 8   Period Weeks   Status New               Plan - 2016-03-22 1514    Clinical Impression Statement Patient presents for low complexity eval for compression fracture in L3.  She has been slowly improving but lacks knowledge and confidence for what is safe for her to do at home.  She demonstrates weakness in hips and inefficient gait patten, decreased endurance. and weak core.     Rehab Potential Excellent   PT Frequency 2x / week   PT Duration 8 weeks   PT Treatment/Interventions Moist Heat;Therapeutic activities;Dry needling;Passive range of motion;Functional mobility training;Patient/family education;Cryotherapy;Psychologist, educational;Neuromuscular re-education;Manual techniques;Balance training;Ultrasound;Therapeutic exercise;Taping   PT Next Visit Plan review HEP, NuStep, try prone for further assessment of LS spine, hip ex strength and ant hip flexibility    PT Home Exercise Plan hip strength (abd,  flexion), hamstring stretch and minisquat    Consulted and Agree with Plan of Care Patient      Patient will benefit from skilled therapeutic intervention in order to improve the following deficits and impairments:  Obesity, Pain, Postural dysfunction, Increased edema, Decreased strength, Decreased mobility, Hypomobility, Impaired flexibility, Improper body mechanics, Decreased activity tolerance, Decreased endurance, Decreased safety awareness, Difficulty walking, Decreased range of motion, Abnormal gait, Increased fascial restricitons  Visit Diagnosis: Bilateral low back pain with right-sided sciatica  Difficulty in walking, not elsewhere classified  Muscle weakness (generalized)  Abnormal posture      G-Codes - 2016/03/22 1532    Functional Assessment Tool Used FOTO   Functional Limitation Mobility: Walking and moving around   Mobility: Walking and Moving Around Current Status [phone]) At least 40 percent but less than 60 percent impaired, limited or restricted   Mobility: Walking and Moving Around Goal Status [phone]) At least 20 percent but less than 40 percent impaired, limited or restricted       Problem List Patient Active Problem List   Diagnosis Date Noted  . Vertebral compression fracture (HCC) 12/09/2015  . Metatarsalgia of  left foot 09/20/2015  . Allergic rhinitis 08/30/2015  . Contusion, hip 05/05/2015  . Transaminitis 04/29/2015  . Seasonal allergies 03/21/2015  . Fatigue 07/10/2014  . Pain in joint, ankle and foot 01/15/2014  . Cataract 01/01/2013  . Preventative health care 02/11/2012  . Asthma 12/07/2011  . Depression 05/10/2011  . Osteoarthritis 04/07/2009  . Hypothyroidism 03/03/2008  . Lumbago 10/03/2007  . Diabetes mellitus type II, controlled (HCC) 05/08/2007  . HLD (hyperlipidemia) 09/19/2006  . HYPERTENSION, BENIGN SYSTEMIC 09/19/2006  . Diaphragmatic hernia 09/19/2006    Nikkita Adeyemi 03/06/2016, 3:35 PM  [facility] [address] [place], [place], [address] Phone: [phone]   Fax:  [phone]  Name: SHERITA PICKELL MRN: [id number] Date of Birth: 1955-12-09  Karie Mainland, PT 03/06/16 3:35 PM Phone: [phone] Fax: [phone]

## 2016-03-06 NOTE — Patient Instructions (Addendum)
HIP: Hamstrings - Short Sitting    Rest leg on raised surface. Keep knee straight. Lift chest. Hold _30__ seconds. _2-3__ reps per set, __1-2_ sets per day, __5-7_ days per week  Copyright  VHI. All rights reserved.    ABDUCTION: Standing (Active)    Stand, feet flat. Lift right leg out to side.  Complete __2_ sets of _10-20__ repetitions. Perform _2__ sessions per day.  http://gtsc.exer.us/111   Copyright  VHI. All rights reserved.  Straight Leg Raise    Tighten stomach and slowly raise locked right leg _10-12___ inches from floor. Repeat __10__ times per set. Do __2__ sets per session. Do __2__ sessions per day.  http://orth.exer.us/1103   Copyright  VHI. All rights reserved.    Mini Squat: Double Leg    With feet shoulder width apart, reach forward for balance and do a mini squat. HOLD ON FOR BALANCE  Keep knees in line with second toe. Knees do not go past toes. Repeat __10_ times per set. Rest _3__ seconds after set. Do __1-2_ sets per session.  http://plyo.exer.us/70   Copyright  VHI. All rights reserved.  Bending    Bend at hips and knees, not back. Keep feet shoulder-width apart.   Copyright  VHI. All rights reserved.

## 2016-03-08 ENCOUNTER — Ambulatory Visit: Payer: Medicare Other | Admitting: Physical Therapy

## 2016-03-08 ENCOUNTER — Encounter: Payer: Self-pay | Admitting: Physical Therapy

## 2016-03-08 DIAGNOSIS — M5441 Lumbago with sciatica, right side: Secondary | ICD-10-CM

## 2016-03-08 DIAGNOSIS — M6281 Muscle weakness (generalized): Secondary | ICD-10-CM

## 2016-03-08 DIAGNOSIS — R262 Difficulty in walking, not elsewhere classified: Secondary | ICD-10-CM

## 2016-03-08 DIAGNOSIS — R293 Abnormal posture: Secondary | ICD-10-CM

## 2016-03-08 NOTE — Therapy (Signed)
[facility] [address] [place], [place], [address] Phone: [phone]   Fax:  [phone]  Physical Therapy Treatment  Patient Details  Name: Brianna Wheeler MRN: [id number] Date of Birth: 07-18-1956 Referring Provider: Payton Mccallum, MD   Encounter Date: 03/08/2016      PT End of Session - 03/08/16 1039    Visit Number 2   Number of Visits 16   Date for PT Re-Evaluation 05/01/16   PT Start Time 1025   PT Stop Time 1100   PT Time Calculation (min) 35 min   Activity Tolerance Patient limited by fatigue;Patient tolerated treatment well   Behavior During Therapy [facility] for tasks assessed/performed      Past Medical History:  Diagnosis Date  . Diabetes mellitus   . Hyperlipidemia   . Hypertension   . Left foot pain 2005   Chronic injury -- multiple broken bones and torn ligaments after accident in garage    Past Surgical History:  Procedure Laterality Date  . TUBAL LIGATION      There were no vitals filed for this visit.      Subjective Assessment - 03/08/16 1030    Subjective Pt reporting low back pain. Pt reporting not sleeping last night. Pt reporting "fogginess" and starting flonase. Pt reporting 6/10.    Patient is accompained by: Family member  son   Pertinent History Lumbar fusion 1995 (fall at work) , DM, HTN, HLD, sciatica    Limitations Standing;Walking;House hold activities;Lifting;Sitting   Diagnostic tests Lumbar spine L3 compression fracture recently re-done and confirmed stable compression fx with arthropathy throughout.     Patient Stated Goals improve confidence, get in the bathtub, walk without cane    Currently in Pain? Yes   Pain Score 6    Pain Location Back   Pain Orientation Lower   Pain Descriptors / Indicators Sharp;Aching   Pain Type Chronic pain   Pain Onset More than a month ago   Pain Frequency Intermittent   Aggravating Factors  walking, seating longer periods   Pain Relieving  Factors lying down, rest, pain meds   Effect of Pain on Daily Activities Household activites, ADL's,    Multiple Pain Sites No                         [facility] Adult PT Treatment/Exercise - 03/08/16 0001      Lumbar Exercises: Stretches   Active Hamstring Stretch 3 reps;30 seconds  bilateral LE   Single Knee to Chest Stretch --  supine marching 20 reps     Knee/Hip Exercises: Supine   Heel Slides 10 reps   Straight Leg Raises --  unable to perform due to pain   Other Supine Knee/Hip Exercises Hip Abduction x 10 each LE                PT Education - 03/08/16 1037    Education provided Yes   Education Details HEP review, added supine marching, encouraged walking    Person(s) Educated Patient   Methods Explanation;Demonstration   Comprehension Verbalized understanding;Returned demonstration          PT Short Term Goals - 03/08/16 1055      PT SHORT TERM GOAL #1   Title Pt will be I with HEP (initial) for hip and trunk flexibility, strength    Time 4   Period Weeks   Status New     PT SHORT TERM GOAL #2  Title Pt will be able to walk in her yard for 15-20 min each day for exercise and have min increase in low back pain    Period Weeks   Status New     PT SHORT TERM GOAL #3   Title Pt will be able to perform sit to stand without use of hands from most chairs to demo functional strength .    Time 4   Period Weeks     PT SHORT TERM GOAL #4   Title Pt will complete balance screen and set goal, understand results.    Time 4   Period Weeks           PT Long Term Goals - 03/06/16 1528      PT LONG TERM GOAL #1   Title Pt will score <40% limited on FOTO to demo improvement in function.    Time 8   Period Weeks   Status New     PT LONG TERM GOAL #2   Title Pt will be able to walk without her cane 50% of the time and report improved confidence    Time 8   Period Weeks   Status New     PT LONG TERM GOAL #3   Title Pt will be I with  more advanced HEP.    Time 8   Period Weeks     PT LONG TERM GOAL #4   Title Balance goal to be determined.    Time 8   Period Weeks   Status New     PT LONG TERM GOAL #5   Title Pt will be able to do housework, yardwork while practicing good body mechanics to preserve back.     Time 8   Period Weeks   Status New               Plan - 03/08/16 1041    Clinical Impression Statement Pt presents today with low back pain s/p compression fx of L3. She reports having done her exercises at home, but reports increased fatigue today due to lack of sleep. We reviewed HEP and added supine marching. Pt tolerated treatment well, reporting pain increased to 9/10 with SLR attempt,. Pt was instructed to allow pain to limit her exercises.    Rehab Potential Excellent   PT Frequency 2x / week   PT Duration 8 weeks   PT Treatment/Interventions Moist Heat;Therapeutic activities;Dry needling;Passive range of motion;Functional mobility training;Patient/family education;Cryotherapy;Psychologist, educationallectrical Stimulation;DME Instruction;Neuromuscular re-education;Manual techniques;Balance training;Ultrasound;Therapeutic exercise;Taping   PT Next Visit Plan review HEP, NuStep, try prone for further assessment of LS spine, hip ex strength and ant hip flexibility    PT Home Exercise Plan hip strength (abd, flexion), hamstring stretch and minisquat    Consulted and Agree with Plan of Care Patient      Patient will benefit from skilled therapeutic intervention in order to improve the following deficits and impairments:  Obesity, Pain, Postural dysfunction, Increased edema, Decreased strength, Decreased mobility, Hypomobility, Impaired flexibility, Improper body mechanics, Decreased activity tolerance, Decreased endurance, Decreased safety awareness, Difficulty walking, Decreased range of motion, Abnormal gait, Increased fascial restricitons  Visit Diagnosis: Difficulty in walking, not elsewhere classified  Bilateral low  back pain with right-sided sciatica  Muscle weakness (generalized)  Abnormal posture     Problem List Patient Active Problem List   Diagnosis Date Noted  . Vertebral compression fracture (HCC) 12/09/2015  . Metatarsalgia of left foot 09/20/2015  . Allergic rhinitis 08/30/2015  . Contusion, hip 05/05/2015  .  Transaminitis 04/29/2015  . Seasonal allergies 03/21/2015  . Fatigue 07/10/2014  . Pain in joint, ankle and foot 01/15/2014  . Cataract 01/01/2013  . Preventative health care 02/11/2012  . Asthma 12/07/2011  . Depression 05/10/2011  . Osteoarthritis 04/07/2009  . Hypothyroidism 03/03/2008  . Lumbago 10/03/2007  . Diabetes mellitus type II, controlled (HCC) 05/08/2007  . HLD (hyperlipidemia) 09/19/2006  . HYPERTENSION, BENIGN SYSTEMIC 09/19/2006  . Diaphragmatic hernia 09/19/2006    Sharmon LeydenJennifer R Martin 03/08/2016, 4:09 PM  [facility] [address] [place], [place], [address] Phone: [phone]   Fax:  [phone]  Name: Brianna MannersBarbara F Wheeler MRN: [id number] Date of Birth: 08/06/1955   Narda AmberJennifer Martin, PT 03/08/16 4:09 PM

## 2016-03-13 ENCOUNTER — Encounter: Payer: Medicare Other | Admitting: Physical Therapy

## 2016-03-14 ENCOUNTER — Encounter: Payer: Medicare Other | Admitting: Physical Therapy

## 2016-03-20 ENCOUNTER — Encounter: Payer: Self-pay | Admitting: Physical Therapy

## 2016-03-20 ENCOUNTER — Ambulatory Visit: Payer: Medicare Other | Admitting: Physical Therapy

## 2016-03-20 DIAGNOSIS — R262 Difficulty in walking, not elsewhere classified: Secondary | ICD-10-CM

## 2016-03-20 DIAGNOSIS — M5441 Lumbago with sciatica, right side: Secondary | ICD-10-CM

## 2016-03-20 DIAGNOSIS — M6281 Muscle weakness (generalized): Secondary | ICD-10-CM
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
## 2016-03-23 ENCOUNTER — Ambulatory Visit: Payer: Medicare Other | Attending: Family Medicine | Admitting: Physical Therapy

## 2016-03-23 DIAGNOSIS — R293 Abnormal posture: Secondary | ICD-10-CM | POA: Insufficient documentation

## 2016-03-23 DIAGNOSIS — R262 Difficulty in walking, not elsewhere classified: Secondary | ICD-10-CM | POA: Diagnosis present

## 2016-03-23 DIAGNOSIS — M5441 Lumbago with sciatica, right side: Secondary | ICD-10-CM | POA: Diagnosis present

## 2016-03-23 DIAGNOSIS — M6281 Muscle weakness (generalized): Secondary | ICD-10-CM | POA: Diagnosis present
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
## 2016-03-27 ENCOUNTER — Ambulatory Visit: Payer: Medicare Other | Admitting: Physical Therapy

## 2016-03-27 DIAGNOSIS — M6281 Muscle weakness (generalized): Secondary | ICD-10-CM

## 2016-03-27 DIAGNOSIS — M5441 Lumbago with sciatica, right side: Secondary | ICD-10-CM

## 2016-03-27 DIAGNOSIS — R262 Difficulty in walking, not elsewhere classified: Secondary | ICD-10-CM

## 2016-03-27 DIAGNOSIS — R293 Abnormal posture: Secondary | ICD-10-CM
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
## 2016-04-03 ENCOUNTER — Ambulatory Visit: Payer: Medicare Other | Admitting: Family Medicine

## 2016-04-03 ENCOUNTER — Ambulatory Visit: Payer: Medicare Other | Admitting: Physical Therapy

## 2016-04-05 ENCOUNTER — Ambulatory Visit: Payer: Medicare Other | Admitting: Physical Therapy

## 2016-04-05 ENCOUNTER — Telehealth: Payer: Self-pay | Admitting: Physical Therapy

## 2016-04-05 NOTE — Telephone Encounter (Signed)
I called patient this AM regarding 2 missed appts (No Shows)  She was asked to call and inform us if she would like to be DC from therapy or rescheduled.  She has not further appts.  Will DC next week if we do not hear from her within a week.    Karie MainlandJennifer Sylas Twombly, PT 04/05/16 10:50 AM Phone: [phone] Fax: [phone]

## 2016-04-28 ENCOUNTER — Other Ambulatory Visit: Payer: Self-pay | Admitting: Family Medicine

## 2016-05-01 ENCOUNTER — Encounter: Payer: Self-pay | Admitting: Family Medicine

## 2016-05-01 ENCOUNTER — Ambulatory Visit (INDEPENDENT_AMBULATORY_CARE_PROVIDER_SITE_OTHER): Payer: Medicare Other | Admitting: Family Medicine

## 2016-05-01 VITALS — BP 132/78 | HR 78 | Temp 98.4°F | Ht 71.0 in | Wt 305.4 lb

## 2016-05-01 DIAGNOSIS — Z23 Encounter for immunization: Secondary | ICD-10-CM

## 2016-05-01 DIAGNOSIS — K219 Gastro-esophageal reflux disease without esophagitis: Secondary | ICD-10-CM | POA: Insufficient documentation

## 2016-05-01 DIAGNOSIS — M4850XD Collapsed vertebra, not elsewhere classified, site unspecified, subsequent encounter for fracture with routine healing: Secondary | ICD-10-CM

## 2016-05-01 DIAGNOSIS — E559 Vitamin D deficiency, unspecified: Secondary | ICD-10-CM

## 2016-05-01 DIAGNOSIS — M5441 Lumbago with sciatica, right side: Secondary | ICD-10-CM

## 2016-05-01 DIAGNOSIS — E538 Deficiency of other specified B group vitamins: Secondary | ICD-10-CM

## 2016-05-01 DIAGNOSIS — E119 Type 2 diabetes mellitus without complications: Secondary | ICD-10-CM

## 2016-05-01 DIAGNOSIS — IMO0001 Reserved for inherently not codable concepts without codable children: Secondary | ICD-10-CM

## 2016-05-01 LAB — CBC
HCT: 40.4 % (ref 35.0–45.0)
Hemoglobin: 13.5 g/dL (ref 11.7–15.5)
MCH: 27.4 pg (ref 27.0–33.0)
MCHC: 33.4 g/dL (ref 32.0–36.0)
MCV: 81.9 fL (ref 80.0–100.0)
MPV: 9.8 fL (ref 7.5–12.5)
PLATELETS: 206 10*3/uL (ref 140–400)
RBC: 4.93 MIL/uL (ref 3.80–5.10)
RDW: 16.1 % — ABNORMAL HIGH (ref 11.0–15.0)
WBC: 6 10*3/uL (ref 3.8–10.8)

## 2016-05-01 LAB — COMPREHENSIVE METABOLIC PANEL
ALK PHOS: 46 U/L (ref 33–130)
ALT: 36 U/L — AB (ref 6–29)
AST: 62 U/L — AB (ref 10–35)
Albumin: 3.8 g/dL (ref 3.6–5.1)
BILIRUBIN TOTAL: 0.8 mg/dL (ref 0.2–1.2)
BUN: 10 mg/dL (ref 7–25)
CO2: 28 mmol/L (ref 20–31)
CREATININE: 0.87 mg/dL (ref 0.50–0.99)
Calcium: 10.1 mg/dL (ref 8.6–10.4)
Chloride: 98 mmol/L (ref 98–110)
GLUCOSE: 111 mg/dL — AB (ref 65–99)
POTASSIUM: 3.5 mmol/L (ref 3.5–5.3)
Sodium: 135 mmol/L (ref 135–146)
TOTAL PROTEIN: 8.5 g/dL — AB (ref 6.1–8.1)

## 2016-05-01 LAB — TSH: TSH: 1.22 mIU/L

## 2016-05-01 LAB — POCT GLYCOSYLATED HEMOGLOBIN (HGB A1C): HEMOGLOBIN A1C: 6.5

## 2016-05-01 MED ORDER — IBUPROFEN 600 MG PO TABS: 600.0000 mg | ORAL_TABLET | Freq: Three times a day (TID) | ORAL | 0 refills | Status: DC | PRN

## 2016-05-01 MED ORDER — TRAMADOL HCL 50 MG PO TABS: 50.0000 mg | ORAL_TABLET | Freq: Two times a day (BID) | ORAL | 2 refills | Status: DC | PRN

## 2016-05-01 MED ORDER — LUMBAR BACK BRACE/SUPPORT PAD MISC: 0 refills | Status: DC

## 2016-05-01 NOTE — Patient Instructions (Addendum)
Take the Tramadol 4 pills a day, either 2 twice a day or spread them out like 1 in AM, 1 in PM, and 2 at dinner.   We are checking thyroid, B12, and vitamin d levels today.  Take the Ibuprofen 600 mg every 12 hours as needed for inflammation and pain relief.    It was good to see you today!
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
## 2016-05-01 NOTE — Assessment & Plan Note (Signed)
Still in fair amount of pain. Neurosurgery is recommended no surgical intervention. Continue walking for exercise as much possible. Increase tramadol to 1-2 mg twice a day as needed. -We will do short-term course of ibuprofen and also help with inflammation and pain relief. -May need to increase omeprazole to prevent/treat any worsening of her GERD.

## 2016-05-01 NOTE — Assessment & Plan Note (Signed)
Controlled with diet. -Doing well. No changes.

## 2016-05-01 NOTE — Assessment & Plan Note (Signed)
On long-term PPI. Checking B12 levels today. May need to increase to 40 mg if she has gastritis with her ibuprofen. To call and let us know.

## 2016-05-02 ENCOUNTER — Encounter: Payer: Self-pay | Admitting: Family Medicine

## 2016-05-02 LAB — VITAMIN B12: Vitamin B-12: 459 pg/mL (ref 200–1100)

## 2016-05-02 LAB — VITAMIN D 25 HYDROXY (VIT D DEFICIENCY, FRACTURES): VIT D 25 HYDROXY: 43 ng/mL (ref 30–100)

## 2016-05-19 ENCOUNTER — Other Ambulatory Visit: Payer: Self-pay | Admitting: Family Medicine

## 2016-06-05 ENCOUNTER — Ambulatory Visit (INDEPENDENT_AMBULATORY_CARE_PROVIDER_SITE_OTHER): Payer: Medicare Other | Admitting: Family Medicine

## 2016-06-05 ENCOUNTER — Encounter: Payer: Self-pay | Admitting: Family Medicine

## 2016-06-05 DIAGNOSIS — IMO0001 Reserved for inherently not codable concepts without codable children: Secondary | ICD-10-CM

## 2016-06-05 DIAGNOSIS — M5441 Lumbago with sciatica, right side: Secondary | ICD-10-CM | POA: Diagnosis not present

## 2016-06-05 DIAGNOSIS — M4850XD Collapsed vertebra, not elsewhere classified, site unspecified, subsequent encounter for fracture with routine healing: Secondary | ICD-10-CM | POA: Diagnosis not present

## 2016-06-05 DIAGNOSIS — Z0000 Encounter for general adult medical examination without abnormal findings: Secondary | ICD-10-CM | POA: Diagnosis not present

## 2016-06-05 MED ORDER — LIDOCAINE 5 % EX PTCH: 1.0000 | MEDICATED_PATCH | CUTANEOUS | 1 refills | Status: DC

## 2016-06-05 MED ORDER — CYCLOBENZAPRINE HCL 10 MG PO TABS: 10.0000 mg | ORAL_TABLET | Freq: Two times a day (BID) | ORAL | 1 refills | Status: DC
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
## 2016-06-05 NOTE — Patient Instructions (Addendum)
Pick up the IcyHot patches.  This will help your back.    Refill for flexeril.   See me back in about 6 weeks to see how things are going and have a pap smear.  We're giving you information on your colonoscopy.  It was good to see you today!

## 2016-06-05 NOTE — Assessment & Plan Note (Signed)
Needs pap smear and colonoscopy.  Will do stool cards and FU colonoscopy if these are positive. Put off pap smear until pain is better controlled.  Will likely need to administer small dose of ativan x 1 prior to pap smear to help with anxiety over pain

## 2016-06-05 NOTE — Assessment & Plan Note (Signed)
Still with fair amount of pain. Will try IcyHot patches and Lidoderm patches for pain relief.   Refill for flexeril. Discussed completing her home back exercises.  She has already completed course of PT and reports she was discharged.

## 2016-06-07 ENCOUNTER — Other Ambulatory Visit: Payer: Self-pay | Admitting: Family Medicine

## 2016-06-25 ENCOUNTER — Other Ambulatory Visit: Payer: Self-pay | Admitting: Family Medicine

## 2016-07-13 ENCOUNTER — Telehealth: Payer: Self-pay | Admitting: Family Medicine

## 2016-07-13 NOTE — Telephone Encounter (Signed)
Pt is calling because she needs something call in for a sore throat. She is not feeling well and would like something to help with this. jw

## 2016-07-13 NOTE — Telephone Encounter (Signed)
There actually aren't any prescription medicines that help with a sore throat.  The best things are Chloraseptic spray or throat lozenges.  If she thinks it might be strep, she needs to be seen for a throat swab.  Thanks!  JW

## 2016-07-13 NOTE — Telephone Encounter (Signed)
Pt informed of below. Zimmerman Rumple, Trevon Strothers D, CMA  

## 2016-07-15 ENCOUNTER — Telehealth: Payer: Self-pay | Admitting: Internal Medicine

## 2016-07-15 NOTE — Telephone Encounter (Signed)
[facility] After [facility]   Number received via page: [phone] Person calling: Brianna RamusBarbara Wheeler  Reason for call: Cold symptoms  Patient call as she has sore throat, congestion, post nasal drainage. Called Dr. Gwendolyn GrantWalden on Thursday when she started to develop symptoms however was not able to get in to see him at that time. She states she feels more congested today. She states she also has a temperature of 99.8 F. She has been taking tylenol, Flonase, and cholraseptic spray for her symptoms. I recommended that she also try Robitussin DM to help with congestion. I stated if she continued to have cold symptoms she could make an appointment to be seen in the clinic next day. I also recommend rest and fluids as she stated she was trying to cook christmas eve dinner for family.    Brianna CharsAsiyah Fiorella Wheeler  PGY-2 [facility]

## 2016-07-18 ENCOUNTER — Encounter: Payer: Self-pay | Admitting: Internal Medicine

## 2016-07-18 ENCOUNTER — Ambulatory Visit (INDEPENDENT_AMBULATORY_CARE_PROVIDER_SITE_OTHER): Payer: Medicare Other | Admitting: Internal Medicine

## 2016-07-18 DIAGNOSIS — R059 Cough, unspecified: Secondary | ICD-10-CM

## 2016-07-18 DIAGNOSIS — R05 Cough: Secondary | ICD-10-CM

## 2016-07-18 MED ORDER — PREDNISONE 20 MG PO TABS: 20.0000 mg | ORAL_TABLET | Freq: Every day | ORAL | 0 refills | Status: DC
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
## 2016-07-18 NOTE — Patient Instructions (Addendum)
It was so nice to meet you!  I have prescribed you a 5 day course of Prednisone to help with the irritation and inflammation. Please take 1 tablet daily for 5 days. You should continue to take the Tylenol, Flonase, Robitussin, and Chloraseptic spray as you are now.  If you are not getting better in the next 1-2 weeks, please schedule an appointment to see Dr. Gwendolyn GrantWalden or come back for a same day visit.

## 2016-07-19 ENCOUNTER — Other Ambulatory Visit: Payer: Self-pay | Admitting: Family Medicine

## 2016-07-19 MED ORDER — ALBUTEROL SULFATE HFA 108 (90 BASE) MCG/ACT IN AERS: 2.0000 | INHALATION_SPRAY | Freq: Four times a day (QID) | RESPIRATORY_TRACT | 1 refills | Status: DC | PRN

## 2016-07-19 NOTE — Telephone Encounter (Signed)
Pt called and needs a refill on her Albuterol . She was here yesterday for a cough and really needs this. She didn't realize she was out of refills. jw

## 2016-07-19 NOTE — Telephone Encounter (Signed)
Refill of albuterol sent to pharmacy.  

## 2016-07-30 ENCOUNTER — Encounter: Payer: Self-pay | Admitting: Family Medicine

## 2016-07-30 ENCOUNTER — Ambulatory Visit (INDEPENDENT_AMBULATORY_CARE_PROVIDER_SITE_OTHER): Payer: Medicare Other | Admitting: Family Medicine

## 2016-07-30 VITALS — BP 138/78 | HR 81 | Temp 98.0°F | Ht 71.0 in | Wt 300.0 lb

## 2016-07-30 DIAGNOSIS — R058 Other specified cough: Secondary | ICD-10-CM

## 2016-07-30 DIAGNOSIS — R05 Cough: Secondary | ICD-10-CM

## 2016-07-30 MED ORDER — BENZONATATE 100 MG PO CAPS: 100.0000 mg | ORAL_CAPSULE | Freq: Two times a day (BID) | ORAL | 0 refills | Status: DC | PRN
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
## 2016-07-30 NOTE — Patient Instructions (Signed)
Nice to meet you today. Your cough is likely residual from your recent upper respiratory infection.  This will take time to get better. You can use the [employer] Perles as needed to help with your cough.  If your shortness of breath or wheezing worsens, please follow-up with us.  Take care, Dr. B   Cough, Adult Coughing is a reflex that clears your throat and your airways. Coughing helps to heal and protect your lungs. It is normal to cough occasionally, but a cough that happens with other symptoms or lasts a long time may be a sign of a condition that needs treatment. A cough may last only 2-3 weeks (acute), or it may last longer than 8 weeks (chronic). What are the causes? Coughing is commonly caused by:  Breathing in substances that irritate your lungs.  A viral or bacterial respiratory infection.  Allergies.  Asthma.  Postnasal drip.  Smoking.  Acid backing up from the stomach into the esophagus (gastroesophageal reflux).  Certain medicines.  Chronic lung problems, including COPD (or rarely, lung cancer).  Other medical conditions such as heart failure. Follow these instructions at home: Pay attention to any changes in your symptoms. Take these actions to help with your discomfort:  Take medicines only as told by your health care provider.  If you were prescribed an antibiotic medicine, take it as told by your health care provider. Do not stop taking the antibiotic even if you start to feel better.  Talk with your health care provider before you take a cough suppressant medicine.  Drink enough fluid to keep your urine clear or pale yellow.  If the air is dry, use a cold steam vaporizer or humidifier in your bedroom or your home to help loosen secretions.  Avoid anything that causes you to cough at work or at home.  If your cough is worse at night, try sleeping in a semi-upright position.  Avoid cigarette smoke. If you smoke, quit smoking. If you need help quitting,  ask your health care provider.  Avoid caffeine.  Avoid alcohol.  Rest as needed. Contact a health care provider if:  You have new symptoms.  You cough up pus.  Your cough does not get better after 2-3 weeks, or your cough gets worse.  You cannot control your cough with suppressant medicines and you are losing sleep.  You develop pain that is getting worse or pain that is not controlled with pain medicines.  You have a fever.  You have unexplained weight loss.  You have night sweats. Get help right away if:  You cough up blood.  You have difficulty breathing.  Your heartbeat is very fast. This information is not intended to replace advice given to you by your health care provider. Make sure you discuss any questions you have with your health care provider. Document Released: 01/05/2011 Document Revised: 12/15/2015 Document Reviewed: 09/15/2014 Elsevier Interactive Patient Education  2017 [employer]

## 2016-08-23 ENCOUNTER — Encounter: Payer: Self-pay | Admitting: Internal Medicine

## 2016-08-23 ENCOUNTER — Ambulatory Visit (INDEPENDENT_AMBULATORY_CARE_PROVIDER_SITE_OTHER): Payer: Medicare Other | Admitting: Internal Medicine

## 2016-08-23 DIAGNOSIS — IMO0001 Reserved for inherently not codable concepts without codable children: Secondary | ICD-10-CM

## 2016-08-23 DIAGNOSIS — M4850XD Collapsed vertebra, not elsewhere classified, site unspecified, subsequent encounter for fracture with routine healing: Secondary | ICD-10-CM

## 2016-08-23 MED ORDER — GABAPENTIN 100 MG PO CAPS: 100.0000 mg | ORAL_CAPSULE | Freq: Three times a day (TID) | ORAL | 3 refills | Status: DC

## 2016-08-23 NOTE — Patient Instructions (Addendum)
Start the Gabapentin at 100 mg three times per day. You can gradually increase to get to 300 mg three times per day.   I would recommend Capsicin roll on creams for your back.   Please make a follow up with your PCP Dr. Gwendolyn GrantWalden.

## 2016-08-23 NOTE — Assessment & Plan Note (Signed)
Still with pain. Will try Gabapentin in place of Flexeril. Recommended patient start at 100 mg TID and gradually increase to 300 mg TID. Follow up with PCP for further adjustment of medication. Recommended Capsicin roll on cream for topical pain relief. Could consider Voltaren gel in the future. Continue with home exercises. Follow up with PCP for further management.

## 2016-08-23 NOTE — Progress Notes (Signed)
Subjective:    Brianna Wheeler - 61 y.o. female MRN [id number]  Date of birth: [birth year]  HPI  Brianna Wheeler is here for follow up of her chronic back pain.  Back Pain: Has been occurring chronically since injury in April 2017 with compression fracture at L3. She reports that pain has worsened over the past few months. It is located in the midline lumbar region and she occasionally has right sided sciatica. No bowel or bladder incontinence. No saddle anesthesia. She was previously seen by PT but was discharged due to worsening pain with treatment. She reports doing home exercises for her back. She is unable to tolerate NSAIDs due to GERD and indigestion symptoms. She takes Tramadol 100 mg BID and Tylenol BID. She was prescribed Lidocaine patches by PCP but reports they do not stick. She tries Flexeril but it makes her excessively sleepy. She reports there is a discussion occurring among surgeons at various health care systems regarding potential treatment plans. She expects to hear from them on 2/6.   -  reports that she has never smoked. She has never used smokeless tobacco. - Review of Systems: Per HPI. - Past Medical History: Patient Active Problem List   Diagnosis Date Noted  . GERD (gastroesophageal reflux disease) 05/01/2016  . Vertebral compression fracture (HCC) 12/09/2015  . Allergic rhinitis 08/30/2015  . Contusion, hip 05/05/2015  . Transaminitis 04/29/2015  . Seasonal allergies 03/21/2015  . Fatigue 07/10/2014  . Cough 07/20/2013  . Cataract 01/01/2013  . Preventative health care 02/11/2012  . Asthma 12/07/2011  . Depression 05/10/2011  . Osteoarthritis 04/07/2009  . Hypothyroidism 03/03/2008  . Lumbago 10/03/2007  . Diabetes mellitus type II, controlled (HCC) 05/08/2007  . HLD (hyperlipidemia) 09/19/2006  . HYPERTENSION, BENIGN SYSTEMIC 09/19/2006  . Diaphragmatic hernia 09/19/2006   - Medications: reviewed and updated   Objective:   Physical Exam BP 122/84    Pulse 79   Temp 98.7 F (37.1 C) (Oral)   Ht 5\' 11"  (1.803 m)   Wt (!) 301 lb 12.8 oz (136.9 kg)   SpO2 97%   BMI 42.09 kg/m  Gen: NAD, alert, cooperative with exam, well-appearing MSK: Normal alignment of spine. Mild TTP over lumbar spine. Paraspinal muscles tender on the right lower side. ROM decreased in forward flexion 2/2 to pain. Normal ROM at neck.  Neuro: Sensation to LE intact. Strength 5/5 in LE. Negative straight leg test on right. Walks with cane.   Assessment & Plan:   Vertebral compression fracture (HCC) Still with pain. Will try Gabapentin in place of Flexeril. Recommended patient start at 100 mg TID and gradually increase to 300 mg TID. Follow up with PCP for further adjustment of medication. Recommended Capsicin roll on cream for topical pain relief. Could consider Voltaren gel in the future. Continue with home exercises. Follow up with PCP for further management.     Marcy Siren, D.O. 08/23/2016, 4:50 PM PGY-2, [facility]

## 2016-09-08 ENCOUNTER — Other Ambulatory Visit: Payer: Self-pay | Admitting: Family Medicine

## 2016-09-08 DIAGNOSIS — M4850XD Collapsed vertebra, not elsewhere classified, site unspecified, subsequent encounter for fracture with routine healing: Secondary | ICD-10-CM

## 2016-09-08 DIAGNOSIS — IMO0001 Reserved for inherently not codable concepts without codable children: Secondary | ICD-10-CM

## 2016-09-08 DIAGNOSIS — M5441 Lumbago with sciatica, right side: Secondary | ICD-10-CM

## 2016-09-11 ENCOUNTER — Telehealth: Payer: Self-pay | Admitting: Family Medicine

## 2016-09-11 NOTE — Telephone Encounter (Signed)
She is still having a lot of back pain despite the medications and cream and wanted to know what else she can do for pain and/or if she should come in? It is too hard for her get in and out of bath to soak.  Please call, thank you. Lynnell Grain/ls

## 2016-09-13 NOTE — Telephone Encounter (Signed)
If she's having back pain despite the Tramadol, she should come in and be seen.  Thanks, JW

## 2016-09-13 NOTE — Telephone Encounter (Signed)
LVM for pt to call back to give them the message below and help schedule an appointment if she would like to come in. Lamonte SakaiZimmerman Rumple, April D, CMA

## 2016-09-24 ENCOUNTER — Telehealth: Payer: Self-pay | Admitting: *Deleted

## 2016-09-24 NOTE — Telephone Encounter (Signed)
Pt states that she is having a Right sciatic nerve pain flareup.  She has tried asper cream and 2 tramadol and its not helping. She wants to know if she can take gabapentin.   Spoke with Dr. Gwendolyn GrantWalden who is advises it is fine to take the gabapentin but that she should already be taking this daily.  When I told her this she was unaware that gabapentin was a daily med, but very appreciative and said she would start taking everyday. [name], Maryjo RochesterJessica Dawn, CMA

## 2016-09-28 NOTE — Telephone Encounter (Signed)
New encounter created for this message on 3/5. Closing this encounter. Brianna Wheeler, April D, [place]

## 2016-10-10 ENCOUNTER — Encounter: Payer: Self-pay | Admitting: Internal Medicine

## 2016-10-10 ENCOUNTER — Ambulatory Visit (INDEPENDENT_AMBULATORY_CARE_PROVIDER_SITE_OTHER): Payer: Medicare Other | Admitting: Internal Medicine

## 2016-10-10 DIAGNOSIS — J069 Acute upper respiratory infection, unspecified: Secondary | ICD-10-CM

## 2016-10-10 NOTE — Patient Instructions (Signed)
It was nice meeting you today Brianna Wheeler!  Please continue to use your Flonase and Allegra as you have been. Also continue using the nasal salt spray.   For your cough, you can take one Tessalon perle up to three times a day.   Continue taking the Tylenol to help with the pressure pains.   Also please continue to drink plenty of fluids as you have been.   If your symptoms are not getting better in a few days, or if your cough worsens and you start having trouble breathing, please call to let us know.   If you have any questions or concerns, please feel free to call the clinic.   Be well,  Dr. Natale MilchLancaster

## 2016-10-10 NOTE — Progress Notes (Signed)
Subjective:   Patient: Brianna Wheeler       Birthdate: 1956-04-25       MRN: [id number]      HPI  Brianna Wheeler is a 61 y.o. female presenting for same day appointment for cold symptoms.   Symptoms began 6 days ago. Patient reporting watery eyes and sinus pressure under her eyes as the most bothersome symptoms. Also reports non-productive cough, sneezing, post-nasal drip. Says she does not feel as if she has to blow her nose but that any mucous is running down her throat. Denies fevers, sore throat. Symptoms seem worse at night. She has taken Tessalon perles for cough once which helped. She still has these left but says she only took one and does not know why she did not continue to take them. Has also tried Sudafed which didn't help much. Takes Tramadol and Tylenol daily for chronic pain and says this has not helped with her sinus pressure pains/headache. Has been drinking plenty of fluids. Has a history of allergies and has been taking Flonase and Allegra as prescribed. Has also been using a saline nasal spray with some improvement in symptoms. Patient's son was diagnosed with sinusitis yesterday. She denies eye redness, fevers, chills. Denies vomiting, diarrhea. Denies wheezing, difficulty breathing.   Smoking status reviewed. Patient is never smoker.   Review of Systems See HPI.     Objective:  Physical Exam  Constitutional: She is oriented to person, place, and time.  Pleasant, obese female sitting on exam table in NAD  HENT:  MMM. No oropharyngeal erythema or exudates. Poor dentition. No nasal discharge present. TTP of maxillary sinus bilaterally.   Eyes: Conjunctivae and EOM are normal. Pupils are equal, round, and reactive to light. Right eye exhibits no discharge. Left eye exhibits no discharge.  Neck: Neck supple.  Cardiovascular: Normal rate, regular rhythm and normal heart sounds.   No murmur heard. Pulmonary/Chest: Effort normal and breath sounds normal. No respiratory  distress. She has no wheezes.  Normal WOB on RA. Speaking in full sentences.   Lymphadenopathy:    She has no cervical adenopathy.  Neurological: She is alert and oriented to person, place, and time.  Psychiatric: Affect and judgment normal.      Assessment & Plan:  URI (upper respiratory infection) Viral vs allergic or combination of two. As symptoms present despite her normal allergy medications, think possible viral component. Patient well-appearing and well-hydrated on exam with lungs CTAB and normal WOB. Discussed continuing current supportive treatment measures and return precautions.  - Can resume Tessalon perles TID PRN. Patient reporting she does not need a refill.  - Continue Flonase, Allegry, saline nasal spray PRN - Continue Tylenol for sinus discomfort - F/u as needed  Tarri Abernethy, MD, MPH PGY-2 [facility] Pager [phone]

## 2016-10-10 NOTE — Assessment & Plan Note (Signed)
Viral vs allergic or combination of two. As symptoms present despite her normal allergy medications, think possible viral component. Patient well-appearing and well-hydrated on exam with lungs CTAB and normal WOB. Discussed continuing current supportive treatment measures and return precautions.  - Can resume Tessalon perles TID PRN. Patient reporting she does not need a refill.  - Continue Flonase, Allegry, saline nasal spray PRN - Continue Tylenol for sinus discomfort - F/u as needed

## 2016-10-26 ENCOUNTER — Other Ambulatory Visit: Payer: Self-pay | Admitting: Family Medicine

## 2016-10-26 ENCOUNTER — Telehealth: Payer: Self-pay | Admitting: Family Medicine

## 2016-11-21 ENCOUNTER — Other Ambulatory Visit: Payer: Self-pay | Admitting: Family Medicine

## 2016-11-26 ENCOUNTER — Telehealth: Payer: Self-pay | Admitting: *Deleted

## 2016-11-26 NOTE — Telephone Encounter (Signed)
Talked with patient and it appears she is having the same issue with foot as she did around this time last year when we [place] in [place]. Instructed her to continue wearing the compression sock daily; she was only wearing it if the foot was swollen. [facility] and see if they will refer her back to us or see to Dr Gala LewandowskyBrent Toves at [facility].

## 2016-12-05 ENCOUNTER — Ambulatory Visit (INDEPENDENT_AMBULATORY_CARE_PROVIDER_SITE_OTHER): Payer: Medicare Other | Admitting: Internal Medicine

## 2016-12-05 ENCOUNTER — Encounter: Payer: Self-pay | Admitting: Internal Medicine

## 2016-12-05 ENCOUNTER — Other Ambulatory Visit: Payer: Self-pay | Admitting: Internal Medicine

## 2016-12-05 VITALS — BP 118/74 | HR 82 | Temp 98.3°F | Ht 71.0 in | Wt 302.8 lb

## 2016-12-05 DIAGNOSIS — E119 Type 2 diabetes mellitus without complications: Secondary | ICD-10-CM

## 2016-12-05 DIAGNOSIS — R600 Localized edema: Secondary | ICD-10-CM | POA: Diagnosis not present

## 2016-12-05 LAB — POCT GLYCOSYLATED HEMOGLOBIN (HGB A1C): Hemoglobin A1C: 6.3

## 2016-12-05 NOTE — Patient Instructions (Signed)
I will check some labs to make sure you don't have any abnormalities causing the swelling. Please go down to Sports Medicine after this visit for the foot pads.

## 2016-12-05 NOTE — Progress Notes (Signed)
Subjective:    Brianna Wheeler - 61 y.o. female MRN [id number]  Date of birth: 12/21/1955  HPI  Brianna Wheeler is here for left foot swelling.  Leg edema - Location: Left foot. Patient reports chronic intermittent left pedal edema. Reports that she has a high arch and has been treated by Dr. Darrick PennaFields with metatarsal pads for arch support which have helped her edema in the past. She has run out of pads to add to her new shoes and has not been wearing them recently.  - Level: Mild  - Onset: gradual 4 days - Progression: gradually improving - Treatment:                      Elevation: yes, Moderately improved              Compression with compression stockings.: Moderately improved - Leg dependency during day: yes - Recent IVFs: no    - PMH:   Heart Failure:no  Liver Disease: no  Advanced Renal Disease: no  Protein Malnutrition: no  Hypoalbuminemia:no  Chronic Venous Insufficiency: no    History of chronic leg edema:yes, Laterality?  Left  T  Thyroid disease: no  Anemia:no   - Associated Symptoms:   Dyspnea: no  Orthopnea:no  DOE: no  Abdominal swelling:no    Well's DVT Criteria: Score 0     Medications   Is patient on DHP CCB, Hydralazine, Pregabalin, Megace, Thiazolidinedione, NSAID, Systemic Corticosteroid or Estrogen? no  Recent weight trends: Down 5 lbs since March.  Filed Weights   12/05/16 1526  Weight: (!) 302 lb 12.8 oz (137.3 kg)   ROS Denies Fever/Chills Denies Pain in Legs Denies Leg Rash Denies Itching Legs  -  reports that she has never smoked. She has never used smokeless tobacco. - Review of Systems: Per HPI. - Past Medical History: Patient Active Problem List   Diagnosis Date Noted  . GERD (gastroesophageal reflux disease) 05/01/2016  . Vertebral compression fracture (HCC) 12/09/2015  . Allergic rhinitis 08/30/2015  . Contusion, hip 05/05/2015  . Transaminitis 04/29/2015  . Seasonal allergies 03/21/2015  . Fatigue 07/10/2014  . Cough  07/20/2013  . URI (upper respiratory infection) 04/07/2013  . Cataract 01/01/2013  . Preventative health care 02/11/2012  . Asthma 12/07/2011  . Depression 05/10/2011  . Osteoarthritis 04/07/2009  . Hypothyroidism 03/03/2008  . Lumbago 10/03/2007  . Diabetes mellitus type II, controlled (HCC) 05/08/2007  . HLD (hyperlipidemia) 09/19/2006  . HYPERTENSION, BENIGN SYSTEMIC 09/19/2006  . Diaphragmatic hernia 09/19/2006   - Medications: reviewed and updated   Objective:   Physical Exam BP 118/74   Pulse 82   Temp 98.3 F (36.8 C) (Oral)   Ht 5\' 11"  (1.803 m)   Wt (!) 302 lb 12.8 oz (137.3 kg)   SpO2 97%   BMI 42.23 kg/m  Gen: NAD, alert, cooperative with exam, well-appearing Lungs: CTAB. Normal WOB.  Ext: 1+ non-pitting edema to the dorsum of the left foot. Edema does not extend beyond the ankle. No calf tenderness and negative Homan's sign bilaterally.     Assessment & Plan:   Pedal edema, Chronic  Edema appears to be dependent as it improves with elevation and compression stockings. Patient reports that edema also improves when she uses metatarsal pads for arch support. No symptoms concerning for CHF and lungs are clear on exam. Well's score of 0 for DVT. No offending medications on drug list. Will check some labs to ensure no other potential  causes of edema.  - CBC - Comprehensive metabolic panel - TSH -Discussed with SM and patient to go to their office after this visit for more metatarsal pads  -follow up with PCP   Marcy Siren, D.O. 12/05/2016, 3:31 PM PGY-2, [facility]

## 2016-12-05 NOTE — Progress Notes (Unsigned)
Subjective:    Brianna Wheeler - 61 y.o. female MRN [id number]  Date of birth: [birth year]  HPI  Brianna Wheeler is here for ***.  Leg edema - Location: {right/left/bi:19544} - Level: *** - Onset: {DESC; ONSET:5708} {numbers 1-12:19994} {TIME XBJYN:82956} - Progression: {Desc; progression:18636} - Treatment: Diuretic medication ***: {(BH) RANGE IMPROVED /WORSE:20012}                       Elevation: {YES NO:22349}, {(BH) RANGE IMPROVED /WORSE:20012}   during day with elevation and {(BH) RANGE IMPROVED     /WORSE:20012} overnight in bed            Compression with ***: {(BH) RANGE IMPROVED /WORSE:20012}           Low Salt Diet *** gram sodium per day: {(BH) RANGE IMPROVED /WORSE:20012} - Leg dependency during day: {YES NO:22349} - Recent IVFs: {YES NO:22349}    - PMH:   Heart Failure:{YES NO:22349}, Type: ***;  Last EF% = *** on ***   Liver Disease: {YES NO:22349}  Advanced Renal Disease: {YES NO:22349}  Protein Malnutrition: {YES NO:22349}  Hypoalbuminemia:{YES NO:22349}  Chronic Venous Insufficiency: {YES NO:22349}    History of chronic leg edema:{YES NO:22349}, Laterality?  {Right/Left/Bilateral:210160114}  T  Thyroid disease: {YES NO:22349}  Anemia:{YES NO:22349}   - Associated Symptoms:   Dyspnea: {YES NO:22349}  Orthopnea:{YES OZ:30865}  DOE: {YES NO:22349}  Abdominal swelling:{YES NO:22349}    Well's DVT Criteria: Malignancy, Recent Immobilization, Recent bedridden or  major surgery, Entire leg swelling, one calf swollen more (>3cm) than other, Hx  of prior DVT, Varicose Veins, or Other more likely diagnosis than DVT? {YES NO:22349}    Medications  Is patient on DHP CCB, Hydralazine, Pregabalin, Megace, Thiazolidinedione,  NSAID, Systemic Corticosteroid or Estrogen? {YES NO:22349}  Recent weight trends:  There were no vitals filed for this visit.  ROS {ACTIONS;DENIES/REPORTS:21021675::"Denies"} Fever/Chills {ACTIONS;DENIES/REPORTS:21021675::"Denies"} Pain in  Legs {ACTIONS;DENIES/REPORTS:21021675::"Denies"} Leg Rash {ACTIONS;DENIES/REPORTS:21021675::"Denies"} Itching Legs   Health Maintenance:  - *** Health Maintenance Due  Topic Date Due  . HIV Screening  06/23/1969  . COLONOSCOPY  06/23/2004  . PAP SMEAR  03/12/2012  . FOOT EXAM  12/31/2015  . LIPID PANEL  12/31/2015  . OPHTHALMOLOGY EXAM  01/25/2016  . PNEUMOCOCCAL POLYSACCHARIDE VACCINE (2) 05/09/2016  . HEMOGLOBIN A1C  10/30/2016  . MAMMOGRAM  11/03/2016    -  reports that she has never smoked. She has never used smokeless tobacco. - Review of Systems: Per HPI. - Past Medical History: Patient Active Problem List   Diagnosis Date Noted  . GERD (gastroesophageal reflux disease) 05/01/2016  . Vertebral compression fracture (HCC) 12/09/2015  . Allergic rhinitis 08/30/2015  . Contusion, hip 05/05/2015  . Transaminitis 04/29/2015  . Seasonal allergies 03/21/2015  . Fatigue 07/10/2014  . Cough 07/20/2013  . URI (upper respiratory infection) 04/07/2013  . Cataract 01/01/2013  . Preventative health care 02/11/2012  . Asthma 12/07/2011  . Depression 05/10/2011  . Osteoarthritis 04/07/2009  . Hypothyroidism 03/03/2008  . Lumbago 10/03/2007  . Diabetes mellitus type II, controlled (HCC) 05/08/2007  . HLD (hyperlipidemia) 09/19/2006  . HYPERTENSION, BENIGN SYSTEMIC 09/19/2006  . Diaphragmatic hernia 09/19/2006   - Medications: reviewed and updated   Objective:   Physical Exam There were no vitals taken for this visit. Gen: NAD, alert, cooperative with exam, well-appearing HEENT: NCAT, PERRL, clear conjunctiva, oropharynx clear, supple neck CV: RRR, good S1/S2, no murmur, no edema, capillary refill brisk  Resp: CTABL, no wheezes, non-labored Abd: SNTND,  BS present, no guarding or organomegaly Skin: no rashes, normal turgor  Neuro: no gross deficits.  Psych: good insight, alert and oriented     Assessment & Plan:   No problem-specific Assessment & Plan notes found for  this encounter.    Marcy Siren, D.O. 12/05/2016, 3:05 PM PGY-2, [facility]

## 2016-12-06 ENCOUNTER — Telehealth: Payer: Self-pay | Admitting: *Deleted

## 2016-12-06 LAB — COMPREHENSIVE METABOLIC PANEL
ALBUMIN: 3.7 g/dL (ref 3.6–4.8)
ALT: 27 IU/L (ref 0–32)
AST: 47 IU/L — ABNORMAL HIGH (ref 0–40)
Albumin/Globulin Ratio: 0.8 — ABNORMAL LOW (ref 1.2–2.2)
Alkaline Phosphatase: 47 IU/L (ref 39–117)
BILIRUBIN TOTAL: 0.8 mg/dL (ref 0.0–1.2)
BUN / CREAT RATIO: 10 — AB (ref 12–28)
BUN: 9 mg/dL (ref 8–27)
CHLORIDE: 99 mmol/L (ref 96–106)
CO2: 24 mmol/L (ref 18–29)
Calcium: 9.6 mg/dL (ref 8.7–10.3)
Creatinine, Ser: 0.94 mg/dL (ref 0.57–1.00)
GFR calc non Af Amer: 65 mL/min/{1.73_m2} (ref >59)
GFR, EST AFRICAN AMERICAN: 75 mL/min/{1.73_m2} (ref >59)
GLOBULIN, TOTAL: 4.5 g/dL (ref 1.5–4.5)
GLUCOSE: 92 mg/dL (ref 65–99)
Potassium: 3.5 mmol/L (ref 3.5–5.2)
SODIUM: 139 mmol/L (ref 134–144)
TOTAL PROTEIN: 8.2 g/dL (ref 6.0–8.5)

## 2016-12-06 LAB — TSH: TSH: 2.18 u[IU]/mL (ref 0.450–4.500)

## 2016-12-06 LAB — CBC
HEMATOCRIT: 39.6 % (ref 34.0–46.6)
HEMOGLOBIN: 12.8 g/dL (ref 11.1–15.9)
MCH: 26.2 pg — AB (ref 26.6–33.0)
MCHC: 32.3 g/dL (ref 31.5–35.7)
MCV: 81 fL (ref 79–97)
Platelets: 206 10*3/uL (ref 150–379)
RBC: 4.89 x10E6/uL (ref 3.77–5.28)
RDW: 16.2 % — ABNORMAL HIGH (ref 12.3–15.4)
WBC: 5.3 10*3/uL (ref 3.4–10.8)

## 2016-12-06 NOTE — Telephone Encounter (Signed)
-----   Message from Arvilla Marketatherine Lauren Wallace, DO sent at 12/06/2016 11:45 AM EDT ----- Please call patient to let her know that labs appeared normal. She should follow up with PCP for further management of her edema.   Marcy Sirenatherine Wallace, D.O. 12/06/2016, 11:45 AM PGY-2, [facility]

## 2016-12-06 NOTE — Telephone Encounter (Signed)
Pt informed of below and appointment scheduled with PCP to follow up. Brianna Wheeler, Brianna Wheeler, [place]

## 2017-01-01 ENCOUNTER — Telehealth: Payer: Self-pay | Admitting: Family Medicine

## 2017-01-01 NOTE — Telephone Encounter (Signed)
Scheduled pt tomorrow @ 2pm with Dr. Kennon RoundsHaney. ep

## 2017-01-01 NOTE — Telephone Encounter (Signed)
Agree with coming in to see Dr. Kennon RoundsHaney.  I can see her as well next week.

## 2017-01-01 NOTE — Telephone Encounter (Signed)
Will forward to PCP. Please have pt schedule this week with a provider before next week.  Clovis PuMartin, Zameria Vogl L, RN

## 2017-01-01 NOTE — Telephone Encounter (Signed)
Pt has damage to her back and a lot of back pain. The medications including the creams just aren't working especially on rainy/damp days. Pt does not want a wheelchair. Pt is depressed from hurting. Pt wants to know what else she can do. Pt would like PCP to call her. Pt has an appointment next week with PCP. ep

## 2017-01-02 ENCOUNTER — Encounter: Payer: Self-pay | Admitting: Student

## 2017-01-02 ENCOUNTER — Ambulatory Visit (INDEPENDENT_AMBULATORY_CARE_PROVIDER_SITE_OTHER): Payer: Medicare Other | Admitting: Student

## 2017-01-02 DIAGNOSIS — G8929 Other chronic pain: Secondary | ICD-10-CM | POA: Diagnosis not present

## 2017-01-02 DIAGNOSIS — M545 Low back pain: Secondary | ICD-10-CM

## 2017-01-02 MED ORDER — GABAPENTIN 100 MG PO CAPS: 300.0000 mg | ORAL_CAPSULE | Freq: Three times a day (TID) | ORAL | 3 refills | Status: DC

## 2017-01-02 NOTE — Progress Notes (Signed)
   Subjective:    Patient ID: Brianna MannersBarbara F Wheeler, female    DOB: 10/13/1955, 61 y.o.   MRN: [id number]   CC: Low back pain  HPI: 61 y/o F with history of chronic low back pain presents for low back pain  Low Back pain - has been chronic in nature - pain is worse with changes in weather or rain - yesterday she had significant pain as it rained - today her pain is much improved - no injuries, no falls, no weakness - no pain radiating down legs  Smoking status reviewed  Review of Systems  Per HPI, else denies  fever, chest pain, shortness of breath,    Objective:  BP 100/70   Pulse 78   Temp 98.9 F (37.2 C) (Oral)   Wt 299 lb (135.6 kg)   SpO2 97%   BMI 41.70 kg/m  Vitals and nursing note reviewed  General: NAD Cardiac: RRR, Respiratory: CTAB, normal effort MSK: 5/5 LE strength, neg straight leg raise bilaterally. Skin: warm and dry, no rashes noted Neuro: alert and oriented, no focal deficits   Assessment & Plan:    Lumbago Long history of chronic low back pain, likely do a arthritis in the setting of remote injury, obesity. No red flags on exam today. Today her pain has actually improved with out intervention - will increase gabapentin from 100 TID to 300 TID  - counseled that this can make her sleepy - continue tramadol, and flexeril - follow with PCP in one week    Alyssa A. Kennon RoundsHaney MD, MS Family Medicine Resident PGY-3 Pager [phone]

## 2017-01-02 NOTE — Patient Instructions (Signed)
Follow up with Dr Gwendolyn GrantWalden as scheduled Take Gabapentin as prescribed Call the office with questions or concerns

## 2017-01-02 NOTE — Assessment & Plan Note (Signed)
Long history of chronic low back pain, likely do a arthritis in the setting of remote injury, obesity. No red flags on exam today. Today her pain has actually improved with out intervention - will increase gabapentin from 100 TID to 300 TID  - counseled that this can make her sleepy - continue tramadol, and flexeril - follow with PCP in one week

## 2017-01-08 ENCOUNTER — Encounter: Payer: Self-pay | Admitting: Family Medicine

## 2017-01-08 ENCOUNTER — Ambulatory Visit (INDEPENDENT_AMBULATORY_CARE_PROVIDER_SITE_OTHER): Payer: Medicare Other | Admitting: Family Medicine

## 2017-01-08 VITALS — BP 124/68 | HR 76 | Temp 98.0°F | Ht 71.0 in | Wt 303.2 lb

## 2017-01-08 DIAGNOSIS — M4850XD Collapsed vertebra, not elsewhere classified, site unspecified, subsequent encounter for fracture with routine healing: Secondary | ICD-10-CM | POA: Diagnosis not present

## 2017-01-08 DIAGNOSIS — M25579 Pain in unspecified ankle and joints of unspecified foot: Secondary | ICD-10-CM

## 2017-01-08 DIAGNOSIS — IMO0001 Reserved for inherently not codable concepts without codable children: Secondary | ICD-10-CM

## 2017-01-08 NOTE — Patient Instructions (Signed)
Go back to just the old 100 mg Gabapentin.  Take this twice a day.  At night, try 2 of these pills.  This is 200 mg.  If it makes you too sleepy, just do 1 pill.  Otherwise, continue 2 at night.  Have Arlys JohnBrian look up [employer] [place].  Contact them about the back brace.  It was good to see you again today

## 2017-01-08 NOTE — Assessment & Plan Note (Signed)
Fairly stable today. We can attempt 200 mg of gabapentin at night. If this causes her drowsiness will stop the increased dose.  Restart 100 mg BID as previously prescribed.   Otherwise doing well.

## 2017-01-08 NOTE — Progress Notes (Signed)
Subjective:    Brianna Wheeler is a 61 y.o. female who presents to [facility] today for FU for back pain:  1.  Back pain:  Chronic for this patient since compression fracture about a year ago. She overall is doing well. She was started on gabapentin 2 weeks ago at an increased dose to 300 mg. She had previously been doing well with 100 mg. She reports she slept for about 14 hours straight with 300 mg and still remained drowsy afterwards.  She is not taking increased dose of gabapentin since then. She is also continuing her Tylenol and tramadol which help with pain relief. Home physical therapy as well. No bladder or bowel incontinence. No acute worsening of her back pain.  2.  Foot edema:  Better with metatarsal pad.  She had been seen previously for this. Metatarsal pad placed in her shoe and this greatly helped. She is also doing better walking. Has not worsened her back pain. No lower extremity edema other than in her feet. No shortness breath/dyspnea.  ROS as above per HPI.    The following portions of the patient's history were reviewed and updated as appropriate: allergies, current medications, past medical history, family and social history, and problem list. Patient is a nonsmoker.    PMH reviewed.  Past Medical History:  Diagnosis Date  . Diabetes mellitus   . Hyperlipidemia   . Hypertension   . Left foot pain 2005   Chronic injury -- multiple broken bones and torn ligaments after accident in garage   Past Surgical History:  Procedure Laterality Date  . TUBAL LIGATION      Medications reviewed. Current Outpatient Prescriptions  Medication Sig Dispense Refill  . albuterol (PROAIR HFA) 108 (90 Base) MCG/ACT inhaler Inhale 2 puffs into the lungs every 6 (six) hours as needed for wheezing or shortness of breath. 8.5 g 1  . aspirin (ASPIR-81) 81 MG EC tablet Take 81 mg by mouth daily.      . benzonatate (TESSALON) 100 MG capsule Take 1 capsule (100 mg total) by mouth 2 (two) times daily  as needed for cough. 40 capsule 0  . cyclobenzaprine (FLEXERIL) 10 MG tablet Take 1 tablet (10 mg total) by mouth 2 (two) times daily. 30 tablet 1  . Elastic Bandages & Supports (LUMBAR BACK BRACE/SUPPORT PAD) MISC Use as needed for support of your back and pain relief. (Patient not taking: Reported on 01/02/2017) 1 each 0  . EPINEPHrine (EPI-PEN) 0.3 mg/0.3 mL DEVI Inject 0.3 mLs (0.3 mg total) into the muscle once. 2 Device 0  . fexofenadine (ALLEGRA) 180 MG tablet Take 180 mg by mouth daily.      . fluticasone (FLONASE) 50 MCG/ACT nasal spray Place 2 sprays into both nostrils daily. Place 2 sprays in each nostril daily 32 g 12  . gabapentin (NEURONTIN) 100 MG capsule Take 3 capsules (300 mg total) by mouth 3 (three) times daily. 90 capsule 3  . hydrochlorothiazide (HYDRODIURIL) 25 MG tablet TAKE ONE TABLET BY MOUTH ONCE DAILY 90 tablet 2  . levothyroxine (SYNTHROID, LEVOTHROID) 112 MCG tablet TAKE ONE TABLET BY MOUTH ONCE DAILY 90 tablet 0  . lidocaine (LIDODERM) 5 % Place 1 patch onto the skin daily. Remove & Discard patch within 12 hours or as directed by MD (Patient not taking: Reported on 01/02/2017) 30 patch 1  . omeprazole (PRILOSEC) 20 MG capsule TAKE ONE CAPSULE BY MOUTH ONCE DAILY. 90 capsule 1  . simvastatin (ZOCOR) 40 MG tablet TAKE ONE  TABLET BY MOUTH AT BEDTIME. 90 tablet 2  . traMADol (ULTRAM) 50 MG tablet TAKE ONE TO TWO TABLETS BY MOUTH EVERY 12 HOURS AS NEEDED FOR PAIN 120 tablet 2   No current facility-administered medications for this visit.      Objective:   Physical Exam BP 124/68   Pulse 76   Temp 98 F (36.7 C) (Oral)   Ht 5\' 11"  (1.803 m)   Wt (!) 303 lb 3.2 oz (137.5 kg)   SpO2 96%   BMI 42.29 kg/m  Gen:  Alert, cooperative patient who appears stated age in no acute distress.  Vital signs reviewed. HEENT: EOMI,  MMM Cardiac:  Regular rate and rhythm without murmur auscultated.  Good S1/S2. Pulm:  Clear to auscultation bilaterally with good air movement.  No  wheezes or rales noted.   MSK:   Mild bilateral tenderness to palpation of lumbar back.  SLR positive BL.  Strength and sensation are 5 out of 5 bilateral lower extremity's. Exts:  No lower extremity edema.  No results found for this or any previous visit (from the past 72 hour(s)).   Imp/Plan:  1.  Foot edema: - much better since placement of metatarsal pad - FU if no improvement.

## 2017-01-30 ENCOUNTER — Other Ambulatory Visit: Payer: Self-pay | Admitting: Family Medicine

## 2017-01-30 DIAGNOSIS — M4850XD Collapsed vertebra, not elsewhere classified, site unspecified, subsequent encounter for fracture with routine healing: Secondary | ICD-10-CM

## 2017-01-30 DIAGNOSIS — M5441 Lumbago with sciatica, right side: Secondary | ICD-10-CM

## 2017-01-30 DIAGNOSIS — IMO0001 Reserved for inherently not codable concepts without codable children: Secondary | ICD-10-CM

## 2017-02-23 ENCOUNTER — Other Ambulatory Visit: Payer: Self-pay | Admitting: Family Medicine

## 2017-03-08 ENCOUNTER — Other Ambulatory Visit: Payer: Self-pay | Admitting: Family Medicine

## 2017-03-08 DIAGNOSIS — Z1231 Encounter for screening mammogram for malignant neoplasm of breast: Secondary | ICD-10-CM

## 2017-03-21 ENCOUNTER — Ambulatory Visit
Admission: RE | Admit: 2017-03-21 | Discharge: 2017-03-21 | Disposition: A | Discharge status: Home / Self Care | Payer: Medicare Other | Source: Ambulatory Visit | Attending: Family Medicine | Admitting: Family Medicine

## 2017-03-21 DIAGNOSIS — Z1231 Encounter for screening mammogram for malignant neoplasm of breast: Secondary | ICD-10-CM

## 2017-03-26 ENCOUNTER — Telehealth: Payer: Self-pay | Admitting: Family Medicine

## 2017-03-26 NOTE — Telephone Encounter (Signed)
After Hours Emergency Line  Patient called after hours line for back pain that has worsened this evening.  She has a history of L4-L5 compression fracture after sustaining a fall in her living room about 1.5 years ago.  She has been taking Tylenol and Tramadol for the pain but is not currently taking her Gabapentin as she was told by pharmacy not to take with Tramadol.  Has also been using a TENS unit .  This evening she feels her pain has worsened and she has had to sit in a chair for about 2.5 hours due to sciatic pain radiating into right leg.  She was wondering if it would be okay to take Gabapentin tonight to help with her symptoms.  Denies numbness and tingling.  Denies weakness.  Advised her to go ahead and take some Gabapentin tonight and keep legs elevated for comfort.  She will make an appointment to see Dr. Gwendolyn GrantWalden later this week if pain does not resolve.  Will route note to PCP for follow-up.   Freddrick MarchYashika Jaquisha Frech MD, PGY-2 03/26/2017

## 2017-04-02 ENCOUNTER — Ambulatory Visit (INDEPENDENT_AMBULATORY_CARE_PROVIDER_SITE_OTHER): Payer: Medicare Other | Admitting: *Deleted

## 2017-04-02 DIAGNOSIS — Z23 Encounter for immunization: Secondary | ICD-10-CM | POA: Diagnosis not present

## 2017-04-03 ENCOUNTER — Telehealth: Payer: Self-pay | Admitting: Internal Medicine

## 2017-04-03 ENCOUNTER — Other Ambulatory Visit: Payer: Self-pay | Admitting: Family Medicine

## 2017-04-03 DIAGNOSIS — M5441 Lumbago with sciatica, right side: Secondary | ICD-10-CM

## 2017-04-03 DIAGNOSIS — IMO0001 Reserved for inherently not codable concepts without codable children: Secondary | ICD-10-CM

## 2017-04-03 DIAGNOSIS — M4850XD Collapsed vertebra, not elsewhere classified, site unspecified, subsequent encounter for fracture with routine healing: Secondary | ICD-10-CM

## 2017-04-03 NOTE — Telephone Encounter (Signed)
**  After Hours/ Emergency Line Call*  Received a call to report that Brianna Wheeler  - reports she had the flu shot yesterday and she has pain at the site.  - it feels swollen but it does not look swollen, it is not red. Also feels hot - no fevers or chills - asks what she can use other than Tylenol. Discussed she can use ibuprofen and tylenol as well as a cold compress over the area. Asked her to follow up if her symptoms do not resolve in the next few days or worsens.  -  Red flags discussed.  Will forward to PCP.  Palma HolterKanishka G Berlin Viereck, MD PGY-2, [facility]

## 2017-04-07 ENCOUNTER — Other Ambulatory Visit: Payer: Self-pay | Admitting: Family Medicine

## 2017-04-15 ENCOUNTER — Ambulatory Visit (INDEPENDENT_AMBULATORY_CARE_PROVIDER_SITE_OTHER): Payer: 59 | Admitting: Internal Medicine

## 2017-04-15 ENCOUNTER — Encounter: Payer: Self-pay | Admitting: Internal Medicine

## 2017-04-15 VITALS — BP 110/80 | HR 67 | Temp 98.2°F | Ht 71.0 in | Wt 303.0 lb

## 2017-04-15 DIAGNOSIS — E119 Type 2 diabetes mellitus without complications: Secondary | ICD-10-CM

## 2017-04-15 DIAGNOSIS — J069 Acute upper respiratory infection, unspecified: Secondary | ICD-10-CM | POA: Diagnosis not present

## 2017-04-15 LAB — POCT GLYCOSYLATED HEMOGLOBIN (HGB A1C): Hemoglobin A1C: 6.3

## 2017-04-15 MED ORDER — BENZONATATE 100 MG PO CAPS: 100.0000 mg | ORAL_CAPSULE | Freq: Two times a day (BID) | ORAL | 0 refills | Status: DC | PRN

## 2017-04-15 NOTE — Patient Instructions (Addendum)
It was so nice to see you!  I think you have a virus.  I would recommend using Tylenol as needed. I have prescribed a medication called Tessalon to help with cough. You can also use some Afrin nose spray over-the-counter, but is very important that you do not use it for more than 2 days.  If you are not feeling better in the next 1-2 weeks, please come back to see [place]!  -Dr. Nancy Marus

## 2017-04-17 NOTE — Assessment & Plan Note (Signed)
Patient having sore throat, nasal congestion, facial pressure, and cough for the last 4 days. She thinks this is due to exposure to a sealant that her roofter used to patch holes in her roof five days ago, but think viral URI is more likely. No signs of bacterial infection on exam. - Continue conservative measures with Tylenol as needed for sore throat - Prescribed Tessalon for cough - Advised patient to use Afrin for 3 days for nasal congestion - Return precautions discussed - Follow-up in 1-2 weeks if no improvement or if worsening

## 2017-04-17 NOTE — Progress Notes (Signed)
   [facility] Phone: [phone]  Subjective:  Brianna Wheeler is a 61 year old female presenting to clinic with sore throat, nasal congestion, and cough for the last 4 days. 5 days ago, a roofer came out to the house to patch holes in her roof. The roofer used a sealant that was very strong-smelling. She states that she could smell it throughout the whole house. The following day, she noticed that her "throat was on fire". She also had nasal congestion, face pressure, post-nasal drip, and cough. She tried using Flonase, Allegra, and Tylenol, which didn't help. She endorses some fatigue and "sometimes feeling hot", but denies any fevers. No nausea, vomiting, or diarrhea.   ROS: See HPI for pertinent positives and negatives  Past Medical History- HTN, asthma, allergic rhinitis, hypothyroidism, T2DM, osteoarthritis, HLD, cataracts.   Family history reviewed for today's visit. No changes.  Social history- patient is a never smoker  Objective: BP 110/80   Pulse 67   Temp 98.2 F (36.8 C) (Oral)   Ht  (1.803 m)   Wt (!) 303 lb (137.4 kg)   SpO2 97%   BMI 42.26 kg/m  Gen: NAD, alert, cooperative with exam HEENT: NCAT, EOMI, MMM, oropharynx mildly erythematous without exudate, nasal turbinates erythematous, TMs normal Neck: FROM, supple, no cervical lymphadenopathy CV: RRR, no murmur Resp: CTABL, no wheezes, normal work of breathing  Assessment/Plan: Viral URI: Patient having sore throat, nasal congestion, facial pressure, and cough for the last 4 days. She thinks this is due to exposure to a sealant that her roofter used to patch holes in her roof five days ago, but think viral URI is more likely. No signs of bacterial infection on exam. - Continue conservative measures with Tylenol as needed for sore throat - Prescribed Tessalon for cough - Advised patient to use Afrin for 3 days for nasal congestion - Return precautions discussed - Follow-up in 1-2 weeks if no  improvement or if worsening   Willadean Carol, MD PGY-3

## 2017-05-13 ENCOUNTER — Telehealth: Payer: Self-pay | Admitting: Family Medicine

## 2017-05-13 NOTE — Telephone Encounter (Signed)
Pt left a message on the nurse line last week.  She states she can hardly walk due to the pain in her back and leg.  She is using the heating pad and taking tramadol but it isnt working. Please advise

## 2017-05-14 ENCOUNTER — Other Ambulatory Visit: Payer: Self-pay | Admitting: Family Medicine

## 2017-05-14 DIAGNOSIS — M545 Low back pain: Secondary | ICD-10-CM

## 2017-05-14 MED ORDER — KETOROLAC TROMETHAMINE 60 MG/2ML IM SOLN
60.0000 mg | Freq: Once | INTRAMUSCULAR | Status: AC
  Administered 2017-05-16: 60 mg via INTRAMUSCULAR

## 2017-05-14 NOTE — Telephone Encounter (Signed)
Called and spoke with patient.  Still in pain.  Not much relieved by her Tramadol.  She plans to make an appt to be evaluated tomorrow.  She can actually walk, but is in pain. No weakness.    Back pain has been going on for over a year now since her fall/compression fracture.  May need more advanced imaging.

## 2017-05-16 ENCOUNTER — Encounter: Payer: Self-pay | Admitting: Internal Medicine

## 2017-05-16 ENCOUNTER — Ambulatory Visit (INDEPENDENT_AMBULATORY_CARE_PROVIDER_SITE_OTHER): Payer: 59 | Admitting: Internal Medicine

## 2017-05-16 DIAGNOSIS — G8929 Other chronic pain: Secondary | ICD-10-CM

## 2017-05-16 DIAGNOSIS — M545 Low back pain, unspecified: Secondary | ICD-10-CM

## 2017-05-16 MED ORDER — GABAPENTIN 100 MG PO CAPS: 100.0000 mg | ORAL_CAPSULE | Freq: Every day | ORAL | 3 refills | Status: DC

## 2017-05-16 NOTE — Patient Instructions (Signed)
It was so nice to see you!  We gave you a Toradol shot today.   If your pain is not better in 4 weeks, please come back to see Dr. Gwendolyn GrantWalden. We may need to discuss getting some imaging.  -Dr. Nancy MarusMayo

## 2017-05-17 NOTE — Progress Notes (Signed)
   [facility] Phone: [phone]  Subjective:  Britta MccreedyBarbara is a 61 year old female presenting to clinic with low back pain for the last week. The pain is located in her right lower back. The pain started after spending a lot of time working in her garden. The pain shoots down the back of her right leg. She thinks the gardening caused a flare-up of her sciatica. She has had this before in the past. The pain is worse with walking and going from laying down to sitting up. The pain is better with laying on her left side. She has tried using her home meds- Flexeril, Tramadol, Tylenol, and Gabapentin, which have helped a little bit. She denies any weakness in her legs. She endorses some numbness and tingling throughout both of her legs, which is chronic for her. No changes in the numbness/tingling. She denies any fevers. She denies bladder/bowel incontinence. She denies saddle anesthesia. She spoke with her PCP over the phone, who ordered a Toradol shot. She would like to receive this today. She has a history of L3 compression fracture in May 2017.  ROS: See HPI for pertinent positives and negatives  Past Medical History- HTN, asthma, GERD, T2DM, hypothyroidism, hx L3 compression fracture, HLD, depression  Family history reviewed for today's visit. No changes.  Social history- patient is a never smoker  Objective: BP 128/78   Pulse 80   Temp 98 F (36.7 C) (Oral)   Ht 5' 11.5" (1.816 m)   Wt (!) 305 lb (138.3 kg)   SpO2 98%   BMI 41.95 kg/m  Gen: Initially bent over with head in her hands, but perks up with conversation; in NAD Back: Mild midline tenderness to palpation at the level of L3. Moderate tenderness to palpation of the right paraspinal muscles in the low back. Decreased ROM in flexion and extension secondary to pain. Neuro: Straight leg raise negative bilaterally. Sensation intact to light touch throughout legs. Reflexes mildly blunted but symmetric. Strength  testing of lower extremities somewhat limited on the right secondary to pain, but strength is grossly normal.  Assessment/Plan: Lumbago: Patient with right sided low back pain. Suspect musculoskeletal etiology, as it began after she spent a lot of time working in her garden. She does have some midline tenderness at the level of L3, but she states that it is her baseline and that pain is not worse than normal. The new pain is located in the right paraspinal muscles. Do not think that patient needs advanced imaging at this time, as she has no red flags and her back pain has only been going on for a week. - Given Toradol 60mg  IM in clinic today - Discussed increasing Gabapentin dose, but patient states that she cannot tolerate any Gabapentin dose higher than 100mg . - Continue Flexeril, Gabapentin, Tylenol, and Tramadol. - Advised patient to use heat to the area - Follow-up in 4 weeks with PCP. If still having pain at that time, could consider getting additional imaging.    Willadean CarolKaty Kyrin Gratz, MD PGY-3

## 2017-05-17 NOTE — Assessment & Plan Note (Signed)
Patient with right sided low back pain. Suspect musculoskeletal etiology, as it began after she spent a lot of time working in her garden. She does have some midline tenderness at the level of L3, but she states that it is her baseline and that pain is not worse than normal. The new pain is located in the right paraspinal muscles. Do not think that patient needs advanced imaging at this time, as she has no red flags and her back pain has only been going on for a week. - Given Toradol 60mg  IM in clinic today - Discussed increasing Gabapentin dose, but patient states that she cannot tolerate any Gabapentin dose higher than 100mg . - Continue Flexeril, Gabapentin, Tylenol, and Tramadol. - Advised patient to use heat to the area - Follow-up in 4 weeks with PCP. If still having pain at that time, could consider getting additional imaging.

## 2017-06-26 ENCOUNTER — Other Ambulatory Visit: Payer: Self-pay | Admitting: Family Medicine

## 2017-06-26 DIAGNOSIS — M5441 Lumbago with sciatica, right side: Secondary | ICD-10-CM

## 2017-07-04 ENCOUNTER — Telehealth: Payer: Self-pay | Admitting: Family Medicine

## 2017-07-04 NOTE — Telephone Encounter (Signed)
Pt [facility].  She will continue to be under their care. She was advised to let her PCP know.

## 2017-07-05 NOTE — Telephone Encounter (Signed)
Thanks for the update.  This is unfortunate.

## 2017-07-24 ENCOUNTER — Telehealth: Payer: Self-pay | Admitting: *Deleted

## 2017-07-24 NOTE — Telephone Encounter (Signed)
Agree with plan 

## 2017-07-24 NOTE — Telephone Encounter (Signed)
Patient called in asking if it is ok to take flonase nasal spray following retina surgery on right eye 06/29/2017. States she is currently on 5 different eye drops and wants to make sure flonase won't interfere. Encouraged patient to contact Retinal Specialist, Dr. Clelia CroftShaw, who performed surgery to be certain. Kinnie FeilL. Ducatte, RN, BSN

## 2017-07-31 ENCOUNTER — Telehealth: Payer: Self-pay | Admitting: *Deleted

## 2017-07-31 NOTE — Telephone Encounter (Signed)
Pt informed of below and she said that she can't get in at this time due to driving restrictions until eye doctor releases her.  She stated that she would add the heat and that she is depending on her sister and husband in [place] to get her to her appointment.  Explained that if they had an opportunity to help her out if she feels like she needs to come before being released to call and make an appointment. Routing to PCP as an Financial plannerYI.

## 2017-07-31 NOTE — Telephone Encounter (Signed)
Pt had retina surgery (retinal detachment) on 06/29/18 and was on bed rest for 4.5 weeks due to recovery.  She is now able to get up and build her strength.  She and her sone walked around the bedroom and down the ramp to her house x 3.  Last night her sciatic nerve flared up and wants to know what she can do.  She is taking tylenol and tramadol and using aspercream with no relief.   Will forward to MD for advice. Florentino Laabs, Maryjo RochesterJessica Dawn, CMA

## 2017-07-31 NOTE — Telephone Encounter (Signed)
I haven't seen her in a while and need to see her back.  In the meantime, she should continue what she's doing for this, including adding a heating pad to help with relief.  Please have her make an appt to see me.  Thanks!  JW

## 2017-08-01 NOTE — Telephone Encounter (Signed)
Thanks. Agree with plan 

## 2017-08-06 ENCOUNTER — Telehealth: Payer: Self-pay | Admitting: Family Medicine

## 2017-08-06 NOTE — Telephone Encounter (Signed)
Thank you for letting us know.  JW

## 2017-08-06 NOTE — Telephone Encounter (Signed)
Pt wanted to let Gwendolyn GrantWalden know that she will not be able to get in to see Gwendolyn GrantWalden until sometime in February. Since her eye surgery she has not been able to do anything with all her restrictions. She said she knows she needs to come into the office to see Gwendolyn GrantWalden and will call at the first of February to see if she can get an appointment, depending on if she is cleared to do so from her surgery.

## 2017-08-08 ENCOUNTER — Other Ambulatory Visit: Payer: Self-pay | Admitting: Family Medicine

## 2017-10-03 ENCOUNTER — Other Ambulatory Visit: Payer: Self-pay

## 2017-10-03 MED ORDER — HYDROCHLOROTHIAZIDE 25 MG PO TABS: 25.0000 mg | ORAL_TABLET | Freq: Every day | ORAL | 2 refills | Status: DC

## 2017-10-03 NOTE — Telephone Encounter (Signed)
Pt called nurse line requesting refill of Hctz sent to [facility]. Shawna OrleansMeredith B Mikaeel Petrow, RN

## 2017-10-04 DIAGNOSIS — H33051 Total retinal detachment, right eye: Secondary | ICD-10-CM | POA: Diagnosis not present

## 2017-10-21 ENCOUNTER — Telehealth: Payer: Self-pay

## 2017-10-21 NOTE — Telephone Encounter (Signed)
Yes she can continue to take this.  She also needs to come in and see me when possible.

## 2017-10-21 NOTE — Telephone Encounter (Signed)
Patient left message on nurse line stating that she has had recent eye surgery and eye doctor stopped many medications. Does she still need to take Calcium 600 with Vitamin D and her adult MVI?  Call back is [phone]. Ples SpecterAlisa Thaily Hackworth, RN [facility] RN)

## 2017-10-24 NOTE — Telephone Encounter (Signed)
Spoke to pt. She will take the meds and schedule an appt with Dr. Gwendolyn GrantWalden. Sunday SpillersSharon T Nalaysia Manganiello, CMA

## 2017-11-18 ENCOUNTER — Other Ambulatory Visit: Payer: Self-pay | Admitting: Family Medicine

## 2017-11-18 DIAGNOSIS — M5441 Lumbago with sciatica, right side: Secondary | ICD-10-CM

## 2017-11-19 ENCOUNTER — Other Ambulatory Visit: Payer: Self-pay | Admitting: Family Medicine

## 2017-11-19 DIAGNOSIS — M5441 Lumbago with sciatica, right side: Secondary | ICD-10-CM

## 2017-12-02 DIAGNOSIS — Z961 Presence of intraocular lens: Secondary | ICD-10-CM | POA: Diagnosis not present

## 2017-12-02 DIAGNOSIS — H26491 Other secondary cataract, right eye: Secondary | ICD-10-CM | POA: Diagnosis not present

## 2017-12-02 DIAGNOSIS — H33051 Total retinal detachment, right eye: Secondary | ICD-10-CM | POA: Diagnosis not present

## 2017-12-02 LAB — HM DIABETES EYE EXAM

## 2017-12-13 ENCOUNTER — Ambulatory Visit (INDEPENDENT_AMBULATORY_CARE_PROVIDER_SITE_OTHER): Payer: Medicare Other | Admitting: Family Medicine

## 2017-12-13 ENCOUNTER — Other Ambulatory Visit: Payer: Self-pay

## 2017-12-13 ENCOUNTER — Encounter: Payer: Self-pay | Admitting: Family Medicine

## 2017-12-13 VITALS — BP 138/78 | HR 82 | Temp 98.9°F | Ht 72.0 in | Wt 299.6 lb

## 2017-12-13 DIAGNOSIS — G8929 Other chronic pain: Secondary | ICD-10-CM

## 2017-12-13 DIAGNOSIS — Z1211 Encounter for screening for malignant neoplasm of colon: Secondary | ICD-10-CM

## 2017-12-13 DIAGNOSIS — M5441 Lumbago with sciatica, right side: Secondary | ICD-10-CM

## 2017-12-13 DIAGNOSIS — E785 Hyperlipidemia, unspecified: Secondary | ICD-10-CM

## 2017-12-13 MED ORDER — OMEPRAZOLE 20 MG PO CPDR: 20.0000 mg | DELAYED_RELEASE_CAPSULE | Freq: Every day | ORAL | 1 refills | Status: DC

## 2017-12-13 MED ORDER — KETOROLAC TROMETHAMINE 30 MG/ML IJ SOLN
30.0000 mg | Freq: Once | INTRAMUSCULAR | Status: AC
  Administered 2017-12-13: 30 mg via INTRAMUSCULAR

## 2017-12-13 MED ORDER — MELOXICAM 7.5 MG PO TABS: 7.5000 mg | ORAL_TABLET | Freq: Every day | ORAL | 0 refills | Status: DC

## 2017-12-13 NOTE — Patient Instructions (Addendum)
It was good to see you today.  Come back for a lab visit anytime next week you would like.  Just make sure to schedule a lab visit.   Take the Meloxicam one pill a day for your back.    Come back and see me in a month to make sure your back is doing okay.

## 2017-12-13 NOTE — Progress Notes (Signed)
Subjective:    Brianna Wheeler is a 62 y.o. female who presents to [facility] today for back pain:  1.  Back pain: Ongoing and chronic issue for patient.  She states that since her retinal surgery she is not been very active upon recommendations from her ophthalmologist.  She has not been as active as she is used to her back pain is worsened.  Describes bilateral low back pain which is alternating between dull aching and sharp stabbing.  With sciatica that generally extends to her right leg.  She has been taking tramadol with some relief but has not been taking any NSAIDs.  Flexeril she is having to cut in half and use this infrequently as this makes her sleepy.  She had no bladder or bowel incontinence.  No lower extremity weakness.  She had retinal detachment in her right eye and this was surgically repaired by her ophthalmologist several weeks ago.  Her vision is good now and she is only infrequently having to see her eye doctor.  2.   HLD:  Last lipid panel listed below.    Currently on simvastatin.  Denies any myalgias, icterus, jaundice.  Tolerating medications well.   Lab Results  Component Value Date   CHOL 153 12/31/2014   CHOL 145 09/24/2013   CHOL 176 03/17/2009   Lab Results  Component Value Date   HDL 39 (L) 12/31/2014   HDL 45 09/24/2013   HDL 42 03/17/2009   Lab Results  Component Value Date   LDLCALC 96 12/31/2014   LDLCALC 74 09/24/2013   LDLCALC 116 (H) 03/17/2009   Lab Results  Component Value Date   TRIG 90 12/31/2014   TRIG 131 09/24/2013   TRIG 89 03/17/2009   Lab Results  Component Value Date   CHOLHDL 3.9 12/31/2014   CHOLHDL 3.2 09/24/2013   CHOLHDL 4.2 Ratio 03/17/2009   3. Prev health:  Currently overdue for screening nd colonoscopy.  ROS as above per HPI.    The following portions of the patient's history were reviewed and updated as appropriate: allergies, current medications, past medical history, family and social history, and problem list. Patient  is a nonsmoker.    PMH reviewed.  Past Medical History:  Diagnosis Date  . Diabetes mellitus   . Hyperlipidemia   . Hypertension   . Left foot pain 2005   Chronic injury -- multiple broken bones and torn ligaments after accident in garage   Past Surgical History:  Procedure Laterality Date  . TUBAL LIGATION      Medications reviewed. Current Outpatient Medications  Medication Sig Dispense Refill  . albuterol (PROAIR HFA) 108 (90 Base) MCG/ACT inhaler Inhale 2 puffs into the lungs every 6 (six) hours as needed for wheezing or shortness of breath. 8.5 g 1  . aspirin (ASPIR-81) 81 MG EC tablet Take 81 mg by mouth daily.      . benzonatate (TESSALON) 100 MG capsule Take 1 capsule (100 mg total) by mouth 2 (two) times daily as needed for cough. 40 capsule 0  . cyclobenzaprine (FLEXERIL) 10 MG tablet Take 1 tablet (10 mg total) by mouth 2 (two) times daily. 30 tablet 1  . Elastic Bandages & Supports (LUMBAR BACK BRACE/SUPPORT PAD) MISC Use as needed for support of your back and pain relief. (Patient not taking: Reported on 01/02/2017) 1 each 0  . EPINEPHrine (EPI-PEN) 0.3 mg/0.3 mL DEVI Inject 0.3 mLs (0.3 mg total) into the muscle once. 2 Device 0  . fexofenadine (ALLEGRA)  180 MG tablet Take 180 mg by mouth daily.      . fluticasone (FLONASE) 50 MCG/ACT nasal spray Place 2 sprays into both nostrils daily. Place 2 sprays in each nostril daily 32 g 12  . gabapentin (NEURONTIN) 100 MG capsule Take 1 capsule (100 mg total) by mouth at bedtime. 90 capsule 3  . hydrochlorothiazide (HYDRODIURIL) 25 MG tablet Take 1 tablet (25 mg total) by mouth daily. 90 tablet 2  . levothyroxine (SYNTHROID, LEVOTHROID) 112 MCG tablet TAKE 1 TABLET BY MOUTH ONCE DAILY 90 tablet 2  . lidocaine (LIDODERM) 5 % Place 1 patch onto the skin daily. Remove & Discard patch within 12 hours or as directed by MD (Patient not taking: Reported on 01/02/2017) 30 patch 1  . omeprazole (PRILOSEC) 20 MG capsule TAKE 1 CAPSULE BY  MOUTH ONCE DAILY 90 capsule 1  . simvastatin (ZOCOR) 40 MG tablet TAKE ONE TABLET BY MOUTH AT BEDTIME 90 tablet 2  . traMADol (ULTRAM) 50 MG tablet TAKE 1 TO 2 TABLETS BY MOUTH EVERY 12 HOURS AS NEEDED FOR PAIN 120 tablet 1   No current facility-administered medications for this visit.      Objective:   Physical Exam There were no vitals taken for this visit. Gen:  Alert, cooperative patient who appears stated age in no acute distress.  Vital signs reviewed. HEENT: EOMI,  MMM.  Right eye with sluggish pupillary reflex but it is present.  Left eye with long-standing cataract Cardiac:  Regular rate and rhythm with grade 1 murmur heard right upper sternal border. Pulm:  Clear to auscultation bilaterally with good air movement.  No wheezes or rales noted.   Abd:  Soft/nondistended/nontender.  Good bowel sounds throughout all four quadrants.  No masses noted.  Back:  Normal-appearing skin overlying back, no bruising.  Spine with normal alignment and no deformity.  No tenderness to vertebral process palpation.  Paraspinous muscles are tender BL lumbar region.   Range of motion full at neck and decreased forward flexion lumbar sacral regions.   Neuro: Strength and sensation intact bilateral lower extremities.  Impression/plan: 1.  Back pain: -Chronic for her. -She does not need any refills for tramadol today.  However she has not been on NSAID.  We will start meloxicam now.  Previously certain NSAIDs have caused gastric distress for her. -She can continue to use as needed Flexeril noted this can make her sleepy. -No red flags. -Toradol shot today for relief.  This is never caused gastric distress for her in the past.  2.  Hyperlipidemia: -Continue on simvastatin. -The lab is closed today but we will recheck her next week with a lab appointment for her lipid panel as well as CBC and c-Met and A1c.  3.  Preventative health: -Referral placed for colonoscopy today.

## 2017-12-14 ENCOUNTER — Encounter: Payer: Self-pay | Admitting: Family Medicine

## 2017-12-17 ENCOUNTER — Telehealth: Payer: Self-pay | Admitting: Family Medicine

## 2017-12-17 NOTE — Telephone Encounter (Signed)
Pt called and would like Dr Gwendolyn Grant to give her a call. She said every time she takes her meloxicam it is making her very nauseous.

## 2017-12-17 NOTE — Telephone Encounter (Signed)
Please call Brianna Wheeler and tell her to stop the meloxicam.  This is a medicine similar to ibuprofen, but different, and my hope was that she could tolerate it.  She obviously cannot.  She should continue with the tramadol and flexeril/cyclobenzaprine for a muscle relaxer.  Thanks!

## 2017-12-17 NOTE — Telephone Encounter (Signed)
Pt informed. Rafia Shedden Dawn, CMA  

## 2017-12-20 ENCOUNTER — Other Ambulatory Visit: Payer: Medicare Other

## 2017-12-20 DIAGNOSIS — E785 Hyperlipidemia, unspecified: Secondary | ICD-10-CM

## 2017-12-21 LAB — COMPREHENSIVE METABOLIC PANEL
A/G RATIO: 0.8 — AB (ref 1.2–2.2)
ALT: 25 IU/L (ref 0–32)
AST: 46 IU/L — ABNORMAL HIGH (ref 0–40)
Albumin: 3.3 g/dL — ABNORMAL LOW (ref 3.6–4.8)
Alkaline Phosphatase: 54 IU/L (ref 39–117)
BILIRUBIN TOTAL: 0.9 mg/dL (ref 0.0–1.2)
BUN/Creatinine Ratio: 7 — ABNORMAL LOW (ref 12–28)
BUN: 6 mg/dL — ABNORMAL LOW (ref 8–27)
CHLORIDE: 99 mmol/L (ref 96–106)
CO2: 23 mmol/L (ref 20–29)
Calcium: 9.2 mg/dL (ref 8.7–10.3)
Creatinine, Ser: 0.87 mg/dL (ref 0.57–1.00)
GFR calc non Af Amer: 71 mL/min/{1.73_m2} (ref >59)
GFR, EST AFRICAN AMERICAN: 82 mL/min/{1.73_m2} (ref >59)
Globulin, Total: 4.3 g/dL (ref 1.5–4.5)
Glucose: 141 mg/dL — ABNORMAL HIGH (ref 65–99)
POTASSIUM: 3.5 mmol/L (ref 3.5–5.2)
Sodium: 136 mmol/L (ref 134–144)
TOTAL PROTEIN: 7.6 g/dL (ref 6.0–8.5)

## 2017-12-21 LAB — LIPID PANEL
CHOL/HDL RATIO: 4.5 ratio — AB (ref 0.0–4.4)
Cholesterol, Total: 162 mg/dL (ref 100–199)
HDL: 36 mg/dL — AB (ref >39)
LDL CALC: 106 mg/dL — AB (ref 0–99)
TRIGLYCERIDES: 101 mg/dL (ref 0–149)
VLDL CHOLESTEROL CAL: 20 mg/dL (ref 5–40)

## 2017-12-21 LAB — CBC
HEMATOCRIT: 38.2 % (ref 34.0–46.6)
HEMOGLOBIN: 12.3 g/dL (ref 11.1–15.9)
MCH: 26 pg — ABNORMAL LOW (ref 26.6–33.0)
MCHC: 32.2 g/dL (ref 31.5–35.7)
MCV: 81 fL (ref 79–97)
Platelets: 164 10*3/uL (ref 150–450)
RBC: 4.73 x10E6/uL (ref 3.77–5.28)
RDW: 16.1 % — ABNORMAL HIGH (ref 12.3–15.4)
WBC: 3.3 10*3/uL — AB (ref 3.4–10.8)

## 2017-12-21 LAB — TSH: TSH: 0.897 u[IU]/mL (ref 0.450–4.500)

## 2017-12-21 LAB — HIV ANTIBODY (ROUTINE TESTING W REFLEX): HIV Screen 4th Generation wRfx: NONREACTIVE

## 2017-12-23 ENCOUNTER — Telehealth: Payer: Self-pay | Admitting: Family Medicine

## 2017-12-23 MED ORDER — LEVOTHYROXINE SODIUM 112 MCG PO TABS: 112.0000 ug | ORAL_TABLET | Freq: Every day | ORAL | 2 refills | Status: DC

## 2017-12-23 NOTE — Telephone Encounter (Signed)
Called and gave results to MignonBarbara re: her labs.  She was supposed to get a toradol shot last visit but last before she did.  Told her to call and schedule a nurse visit for 30 mg Toradol shot.  (I couldn't figure out how to put in a future order for this).  Thanks!

## 2017-12-23 NOTE — Telephone Encounter (Signed)
Pt would like to know her results from her lab work on 5/31 and would like to know if she should continue with her normal med schedule or change something. Please call pt to discuss this.

## 2017-12-24 ENCOUNTER — Ambulatory Visit (INDEPENDENT_AMBULATORY_CARE_PROVIDER_SITE_OTHER): Payer: Medicare Other

## 2017-12-24 DIAGNOSIS — R52 Pain, unspecified: Secondary | ICD-10-CM

## 2017-12-24 MED ORDER — KETOROLAC TROMETHAMINE 30 MG/ML IJ SOLN
30.0000 mg | Freq: Once | INTRAMUSCULAR | Status: AC
  Administered 2017-12-24: 30 mg via INTRAMUSCULAR

## 2017-12-24 NOTE — Progress Notes (Signed)
   Patient in to nurse clinic for Toradol 30 mg IM for back pain ordered by PCP on 12/23/17. Injection given IM in LUOQ. Patient tolerated well. Ples SpecterAlisa Dariella Gillihan, RN [facility] RN)

## 2018-01-13 ENCOUNTER — Telehealth: Payer: Self-pay | Admitting: *Deleted

## 2018-01-13 NOTE — Telephone Encounter (Signed)
LM on nurse line stating that she fell into the bathtub yesterday, she wants to know what she can do for the pain.  Per Dr. Gwendolyn GrantWalden, She can take 2 tramadol, 2 muscle relaxers and tylenol as needed and use a heating pad as needed.   Relayed message to pt, she is doing all the above other than the heating pad and she is only taking 1/2 a muscle relaxer because she needs to be alert in case "Judie GrieveBryan needs anything"  She will find her heating pad and use it. Kaius Daino, Maryjo RochesterJessica Dawn, CMA

## 2018-01-21 ENCOUNTER — Encounter: Payer: Self-pay | Admitting: Family Medicine

## 2018-02-06 ENCOUNTER — Ambulatory Visit (INDEPENDENT_AMBULATORY_CARE_PROVIDER_SITE_OTHER): Payer: Medicare Other | Admitting: Student in an Organized Health Care Education/Training Program

## 2018-02-06 ENCOUNTER — Encounter: Payer: Self-pay | Admitting: Student in an Organized Health Care Education/Training Program

## 2018-02-06 ENCOUNTER — Other Ambulatory Visit: Payer: Self-pay

## 2018-02-06 DIAGNOSIS — L989 Disorder of the skin and subcutaneous tissue, unspecified: Secondary | ICD-10-CM | POA: Diagnosis not present

## 2018-02-06 NOTE — Assessment & Plan Note (Signed)
Lesion on left nose next eye.  Concern for basal cell carcinoma given for dimpling, pearly white, with light contact patient.  We will send her to dermatology for consideration for excision and pathology.

## 2018-02-06 NOTE — Progress Notes (Signed)
Subjective:    Brianna Wheeler - 62 y.o. female MRN [id number]  Date of birth: [birth year]  HPI  Brianna Wheeler is here for a skin lesion.  Patient notes papule on bridge of nose near her left eye. This has been present for several weeks. It is not painful. She reports it scabs over but does not go away. She has not noticed warmth or drainage. No prior similar lesions. She denies spending much time outdoors, no more than average.  No other rashes or lesions. No fevers, night sweats or weight loss.   Reviewed med list, cc, problem list.  Review of Systems See HPI     Objective:   Physical Exam BP 122/62   Pulse 75   Temp 98.5 F (36.9 C) (Oral)   Ht 5' 11.5" (1.816 m)   Wt (!) 303 lb (137.4 kg)   SpO2 98%   BMI 41.67 kg/m  Gen: NAD, alert, cooperative with exam, well-appearing  Skin: +47mm raised skin papule on left side of nose next to eye. +light telangiectasias and pitting. No drainage, erythema or surrounding warmth     Assessment & Plan:   Skin lesion of face Lesion on left nose next eye.  Concern for basal cell carcinoma given for dimpling, pearly white, with light contact patient.  We will send her to dermatology for consideration for excision and pathology.   Orders Placed This Encounter  Procedures  . Ambulatory referral to Dermatology    Referral Priority:   Routine    Referral Type:   Consultation    Referral Reason:   Specialty Services Required    Requested Specialty:   Dermatology    Number of Visits Requested:   1    No orders of the defined types were placed in this encounter.   Howard Pouch, MD,MS,  PGY3 02/08/2018 2:22 PM

## 2018-02-06 NOTE — Patient Instructions (Signed)
It was a pleasure seeing you today in our clinic. Today we discussed your skin lesion. Here is the treatment plan we have discussed and agreed upon together:   A consult was placed to dermatology at today's visit.  You will receive a call to schedule an appointment. If you do not receive a call within two weeks please call our office so we can place the consult again.  Our clinic's number is [phone]. Please call with questions or concerns about what we discussed today.  Be well, Dr. Mosetta PuttFeng   Sign up for My Chart to have easy access to your labs results, and communication with your primary care physician.

## 2018-02-17 ENCOUNTER — Ambulatory Visit (INDEPENDENT_AMBULATORY_CARE_PROVIDER_SITE_OTHER): Payer: Medicare Other | Admitting: Family Medicine

## 2018-02-17 VITALS — BP 122/60 | HR 80 | Temp 98.7°F | Ht 71.5 in | Wt 302.8 lb

## 2018-02-17 DIAGNOSIS — M545 Low back pain: Secondary | ICD-10-CM | POA: Diagnosis not present

## 2018-02-17 MED ORDER — KETOROLAC TROMETHAMINE 30 MG/ML IJ SOLN
30.0000 mg | Freq: Once | INTRAMUSCULAR | Status: AC
  Administered 2018-02-17: 30 mg via INTRAMUSCULAR

## 2018-02-17 MED ORDER — LEVOTHYROXINE SODIUM 112 MCG PO TABS: 112.0000 ug | ORAL_TABLET | Freq: Every day | ORAL | 2 refills | Status: DC

## 2018-02-17 NOTE — Assessment & Plan Note (Signed)
Suspect musculoskeletal etiology given patient had known cause of acute injury with history of right sided low back pain. Will give Toradol shot here in office again and also instructed patient to continue taking home medications of Tramadol, Tylenol, Flexeril, and Gabapentin. Encouraged use of heating pad as well.

## 2018-02-17 NOTE — Patient Instructions (Signed)
It was great to meet you today! Thank you for letting me participate in your care!  Today, we discussed your low back pain. You received a shot of Toradol in the clinic today to provide some relief. Please use heating pads and your regular medications to continue to stay on top of the pain.  I also refilled your levothyroxine, a medication for your thyroid.  Be well, Jules Schickim Lavita Pontius, DO PGY-2, [facility]
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
## 2018-02-18 ENCOUNTER — Other Ambulatory Visit: Payer: Self-pay | Admitting: Family Medicine

## 2018-02-18 DIAGNOSIS — M5441 Lumbago with sciatica, right side: Secondary | ICD-10-CM

## 2018-02-21 DIAGNOSIS — H3321 Serous retinal detachment, right eye: Secondary | ICD-10-CM | POA: Diagnosis not present

## 2018-02-21 DIAGNOSIS — H26491 Other secondary cataract, right eye: Secondary | ICD-10-CM | POA: Diagnosis not present

## 2018-02-21 DIAGNOSIS — H35371 Puckering of macula, right eye: Secondary | ICD-10-CM | POA: Diagnosis not present

## 2018-02-21 DIAGNOSIS — Z961 Presence of intraocular lens: Secondary | ICD-10-CM | POA: Diagnosis not present

## 2018-02-28 IMAGING — MR MR FOOT*L* W/O CM
4 of 6 series · 22 of 40 positions shown · non-contrast
Comparison: Radiographs 09/02/2015.

CLINICAL DATA: Left forefoot pain, swelling and erythema for 6
weeks. No recent injury. History of ankle fracture in 0225.

EXAM:
MRI OF THE LEFT FOREFOOT WITHOUT CONTRAST
TECHNIQUE: Multiplanar, multisequence MR imaging was performed. No intravenous
contrast was administered.

[Series 5: T1 · coronal · 4.0mm · 0.44mm/px · 3 of 32 slices shown]
[im 4/32]
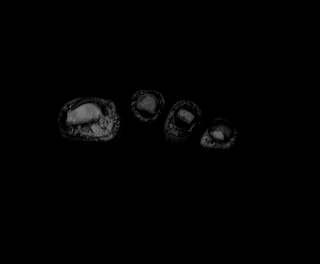
[im 16/32]
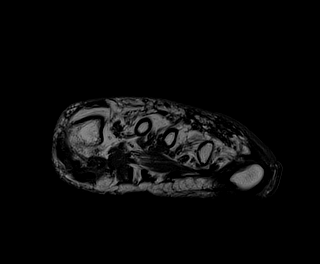
[im 28/32]
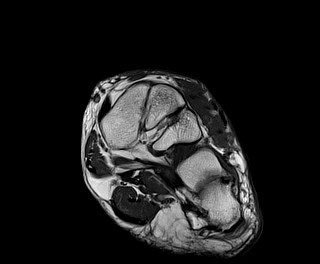

[Series 6: T2 fat-sat · coronal · 4.0mm · 0.27mm/px · 9 of 32 slices shown (1 of 3)]
[im 1/32]
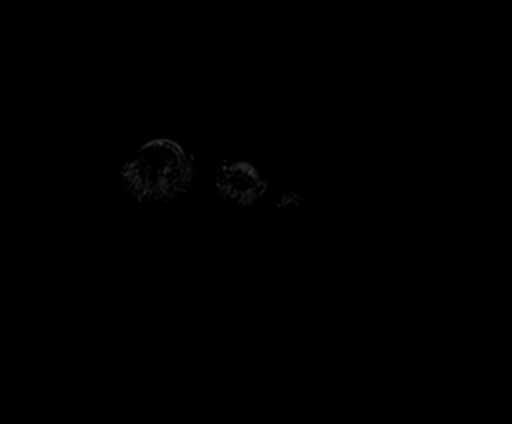
[im 4/32]
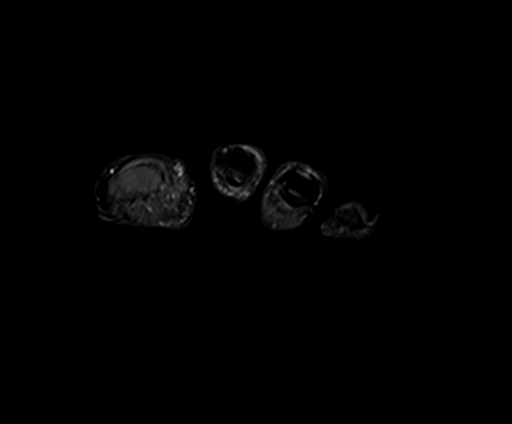
[im 8/32]
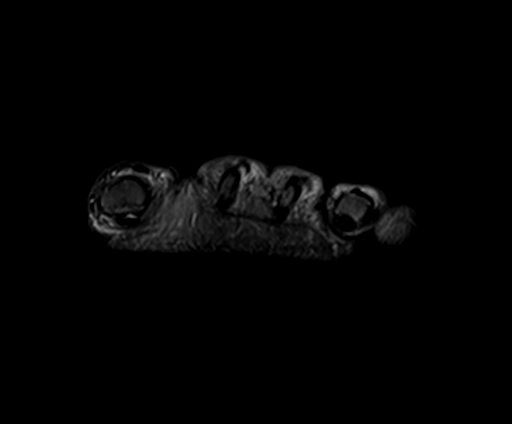
[im 12/32]
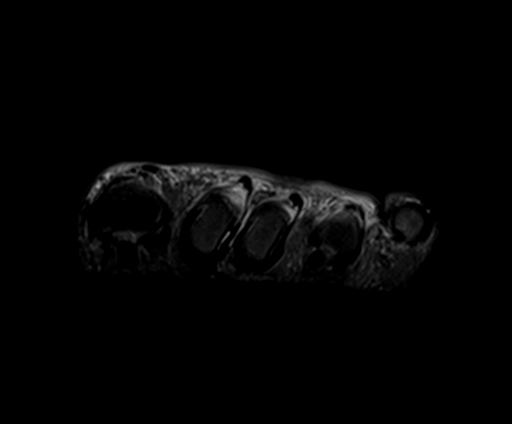
[im 16/32]
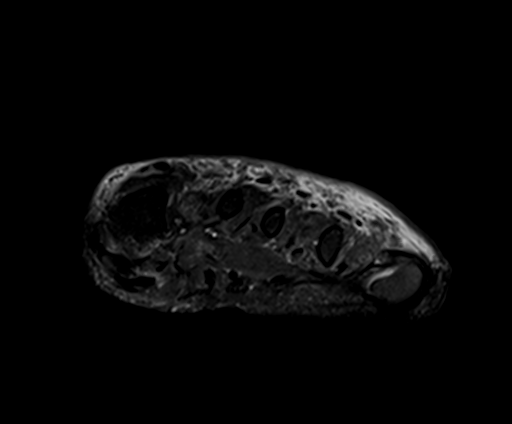
[im 20/32]
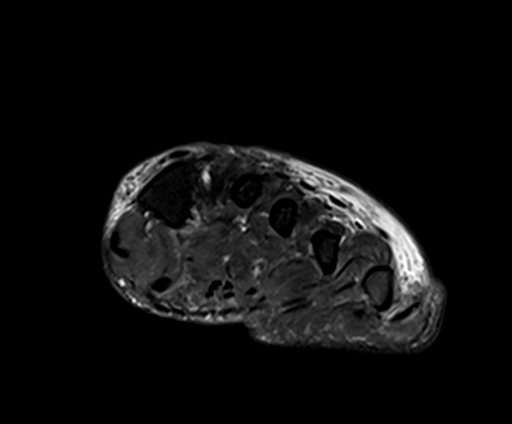
[im 24/32]
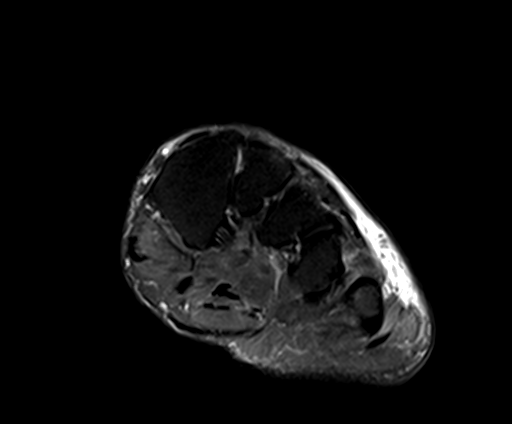
[im 28/32]
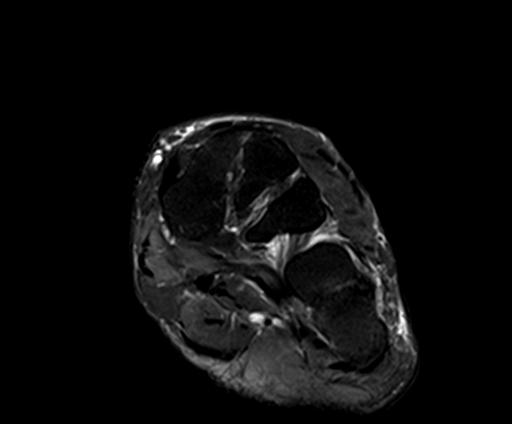
[im 32/32]
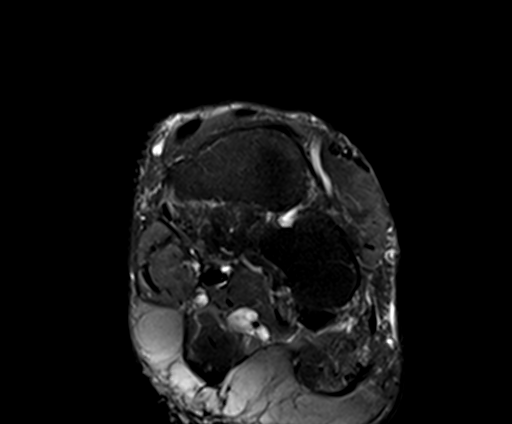

[Series 8: T2 fat-sat · axial · 3.0mm · 0.35mm/px · z∈[-127,-75]mm · 5 of 20 slices shown (2 of 3)]
[im 1/20]
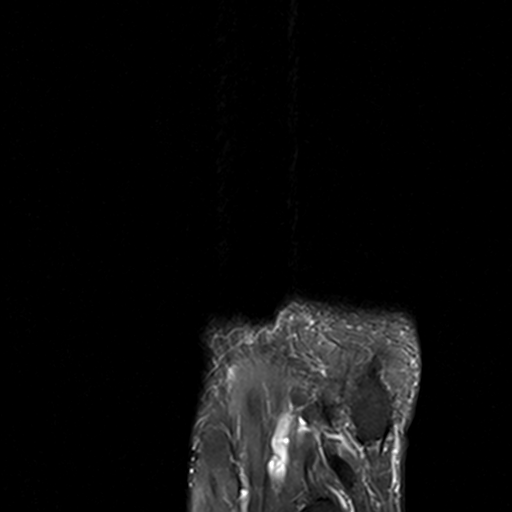
[im 5/20]
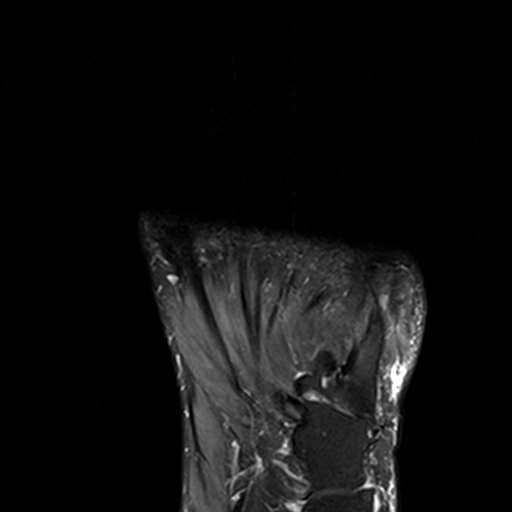
[im 10/20]
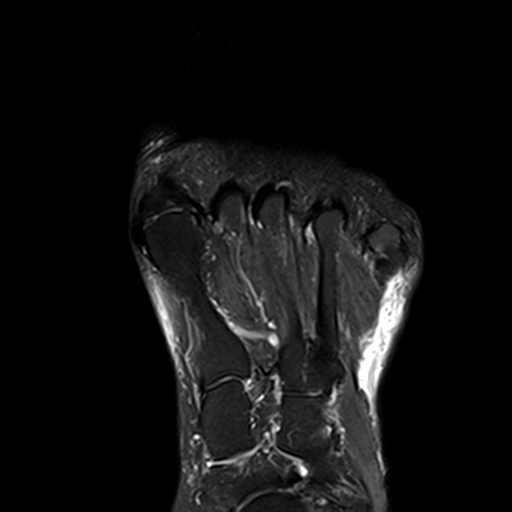
[im 15/20]
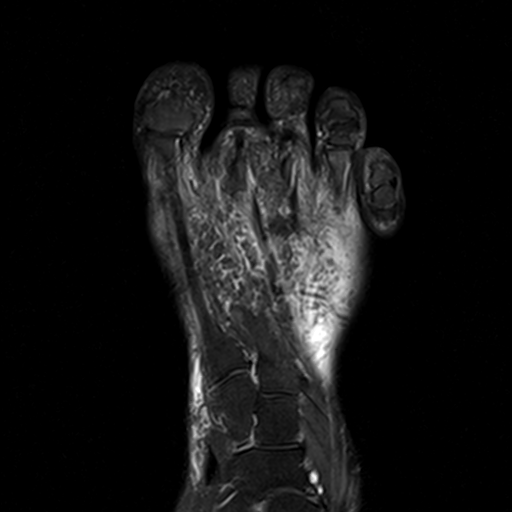
[im 20/20]
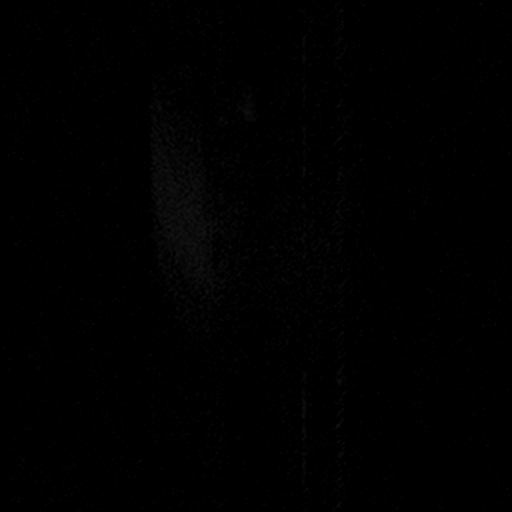

[Series 9: T2 fat-sat · sagittal · 3.5mm · 0.35mm/px · 5 of 26 slices shown (3 of 3)]
[im 1/26]
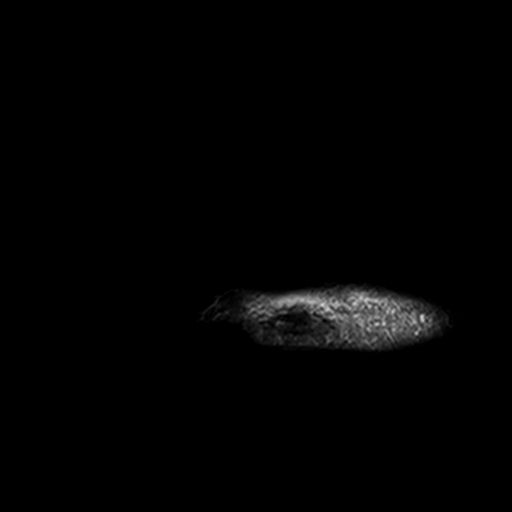
[im 5/26]
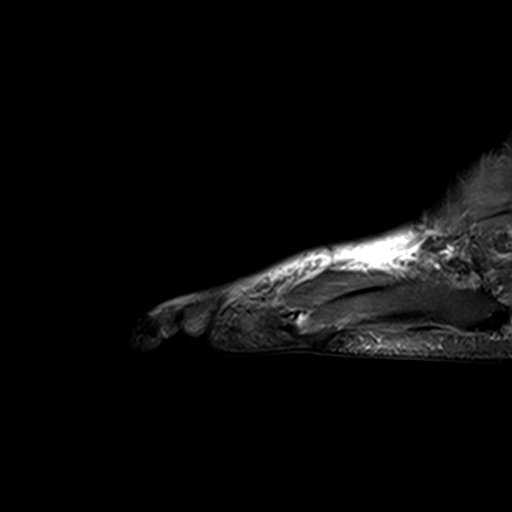
[im 9/26]
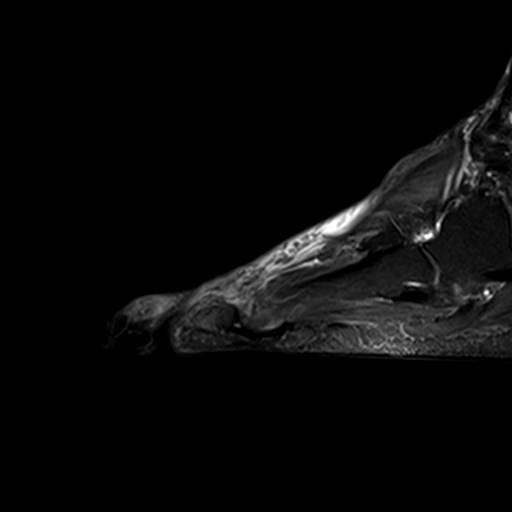
[im 13/26]
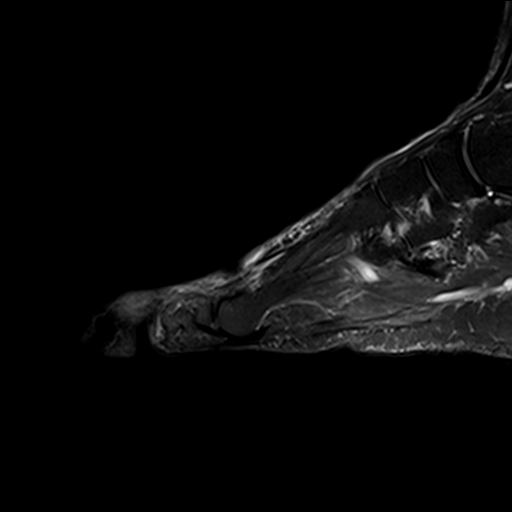
[im 21/26]
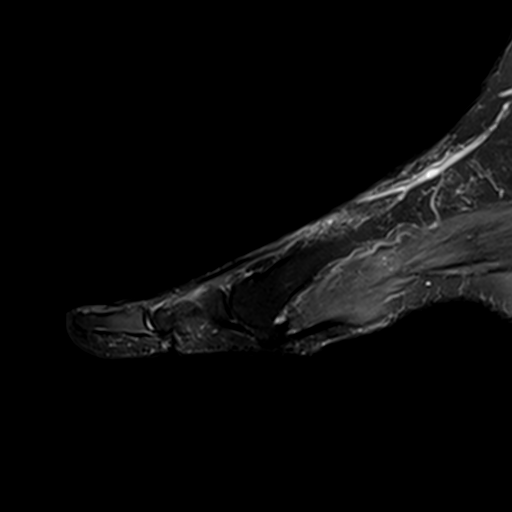

[22 of 40 positions shown; findings below may reference images not displayed]

FINDINGS: A capsule was placed dorsally over the third metatarsal head. There
is an additional capsule lateral to the base of the fifth
metatarsal. There is diffuse subcutaneous edema throughout the
dorsal and lateral aspect the forefoot. No focal fluid collection or
significant tenosynovitis demonstrated. The forefoot musculature
appears unremarkable.

No acute osseous findings or significant arthropathic changes are
seen within the forefoot. The alignment is normal at the Lisfranc
joint. The Lisfranc ligament is intact. There are degenerative
changes at the calcaneocuboid articulation, best seen on series 7.
IMPRESSION: 1. Nonspecific subcutaneous edema dorsally in the forefoot. No focal
fluid collection or musculotendinous abnormality identified.
2. No acute osseous findings or significant degenerative changes in
the forefoot. Mild calcaneocuboid degenerative changes.

## 2018-03-11 ENCOUNTER — Ambulatory Visit: Payer: Medicare Other | Admitting: Family Medicine

## 2018-03-25 ENCOUNTER — Ambulatory Visit (INDEPENDENT_AMBULATORY_CARE_PROVIDER_SITE_OTHER): Payer: Medicare Other | Admitting: Family Medicine

## 2018-03-25 ENCOUNTER — Encounter: Payer: Self-pay | Admitting: Family Medicine

## 2018-03-25 ENCOUNTER — Other Ambulatory Visit: Payer: Self-pay

## 2018-03-25 VITALS — BP 130/72 | HR 84 | Temp 98.5°F | Ht 72.0 in | Wt 202.8 lb

## 2018-03-25 DIAGNOSIS — E119 Type 2 diabetes mellitus without complications: Secondary | ICD-10-CM | POA: Diagnosis not present

## 2018-03-25 DIAGNOSIS — M4850XA Collapsed vertebra, not elsewhere classified, site unspecified, initial encounter for fracture: Secondary | ICD-10-CM

## 2018-03-25 DIAGNOSIS — Z23 Encounter for immunization: Secondary | ICD-10-CM | POA: Diagnosis not present

## 2018-03-25 LAB — POCT GLYCOSYLATED HEMOGLOBIN (HGB A1C): HbA1c, POC (controlled diabetic range): 6 % (ref 0.0–7.0)

## 2018-03-25 MED ORDER — GABAPENTIN 300 MG PO CAPS: 300.0000 mg | ORAL_CAPSULE | Freq: Every day | ORAL | 1 refills | Status: DC

## 2018-03-25 MED ORDER — KETOROLAC TROMETHAMINE 30 MG/ML IJ SOLN
30.0000 mg | Freq: Once | INTRAMUSCULAR | Status: AC
  Administered 2018-03-25: 30 mg via INTRAMUSCULAR
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
## 2018-03-25 NOTE — Assessment & Plan Note (Signed)
Back pain persists. -Increasing gabapentin to 300 mg today. -This might also help with sleep at nighttime. -Toradol shot today. -Continue tramadol.

## 2018-03-25 NOTE — Assessment & Plan Note (Signed)
At goal. -Foot exam performed today.  No concerns.  See exam. -She states she has had a home urine microalbumin and will bring this in so we can scan it into her chart. -She therefore declined having this repeated today.

## 2018-03-25 NOTE — Patient Instructions (Signed)
It was good to see you again today.  Increase the Gabapentin to 300 mg at night.  This might also help you to sleep.  If you're still having trouble sleeping in the next month once everyone is out of the house let me know.  Toradol shot today.  Come back and see me in 1 month to see how you are doing.

## 2018-05-14 ENCOUNTER — Ambulatory Visit: Payer: Medicare Other

## 2018-05-14 ENCOUNTER — Encounter: Payer: Self-pay | Admitting: Student in an Organized Health Care Education/Training Program

## 2018-05-14 ENCOUNTER — Ambulatory Visit (INDEPENDENT_AMBULATORY_CARE_PROVIDER_SITE_OTHER): Payer: Medicare Other | Admitting: Student in an Organized Health Care Education/Training Program

## 2018-05-14 ENCOUNTER — Other Ambulatory Visit: Payer: Self-pay

## 2018-05-14 VITALS — BP 140/72 | HR 75 | Temp 98.0°F | Ht 72.0 in | Wt 304.6 lb

## 2018-05-14 DIAGNOSIS — J0110 Acute frontal sinusitis, unspecified: Secondary | ICD-10-CM

## 2018-05-14 MED ORDER — AMOXICILLIN 500 MG PO CAPS: 500.0000 mg | ORAL_CAPSULE | Freq: Two times a day (BID) | ORAL | 0 refills | Status: DC

## 2018-05-14 NOTE — Patient Instructions (Signed)
It was a pleasure seeing you today in our clinic. Here is the treatment plan we have discussed and agreed upon together:  Please complete your 7 days of antibiotics (twice daily).  Please use over the counter remedies for your symptoms and continue flonase.  Follow up or call us if your symptoms worsen or fail to improve or if you are unable to keep down fluids.  Our clinic's number is [phone]. Please call with questions or concerns about what we discussed today.  Be well, Dr. Mosetta Putt

## 2018-05-14 NOTE — Progress Notes (Signed)
Subjective:    Brianna Wheeler - 62 y.o. female MRN [id number]  Date of birth: [birth year]  HPI  Brianna Wheeler is here for sinus pressure and fever.  Patient endorses 5 days of sinus pressure and fever with T-max to 101 F.  She reports that initially she had throat pain which progressed to pain in her face that is worse with leaning forward improved with standing up.  She has had nasal congestion and drainage.  She additionally has felt pressure in her left ear.  She has been using Flonase, Allegra and Tylenol.  She takes Tylenol every day, however checks her temperature at night after she feels the Tylenol has been off and has been febrile at that time.  No sick contacts that she knows of.  No wheezing, has not required her albuterol inhaler.  Chest pain or shortness of breath.  No N/V/D.  No urinary symptoms.  She did take Tylenol prior to coming into the office today.   -  reports that she has never smoked. She has never used smokeless tobacco. - Review of Systems: Per HPI. - Past Medical History: Patient Active Problem List   Diagnosis Date Noted  . Skin lesion of face 02/06/2018  . GERD (gastroesophageal reflux disease) 05/01/2016  . Vertebral compression fracture (HCC) 12/09/2015  . Allergic rhinitis 08/30/2015  . Transaminitis 04/29/2015  . Seasonal allergies 03/21/2015  . Cataract 01/01/2013  . Preventative health care 02/11/2012  . Asthma 12/07/2011  . Depression 05/10/2011  . Osteoarthritis 04/07/2009  . Hypothyroidism 03/03/2008  . Lumbago 10/03/2007  . Diabetes mellitus type II, controlled (HCC) 05/08/2007  . HLD (hyperlipidemia) 09/19/2006  . HYPERTENSION, BENIGN SYSTEMIC 09/19/2006  . Diaphragmatic hernia 09/19/2006   - Medications: reviewed and updated Current Outpatient Medications  Medication Sig Dispense Refill  . albuterol (PROAIR HFA) 108 (90 Base) MCG/ACT inhaler Inhale 2 puffs into the lungs every 6 (six) hours as needed for wheezing or shortness of  breath. 8.5 g 1  . aspirin (ASPIR-81) 81 MG EC tablet Take 81 mg by mouth daily.      . cyclobenzaprine (FLEXERIL) 10 MG tablet Take 1 tablet (10 mg total) by mouth 2 (two) times daily. 30 tablet 1  . Elastic Bandages & Supports (LUMBAR BACK BRACE/SUPPORT PAD) MISC Use as needed for support of your back and pain relief. (Patient not taking: Reported on 01/02/2017) 1 each 0  . EPINEPHrine (EPI-PEN) 0.3 mg/0.3 mL DEVI Inject 0.3 mLs (0.3 mg total) into the muscle once. 2 Device 0  . fexofenadine (ALLEGRA) 180 MG tablet Take 180 mg by mouth daily.      . fluticasone (FLONASE) 50 MCG/ACT nasal spray Place 2 sprays into both nostrils daily. Place 2 sprays in each nostril daily 32 g 12  . gabapentin (NEURONTIN) 300 MG capsule Take 1 capsule (300 mg total) by mouth at bedtime. 30 capsule 1  . hydrochlorothiazide (HYDRODIURIL) 25 MG tablet Take 1 tablet (25 mg total) by mouth daily. 90 tablet 2  . levothyroxine (SYNTHROID, LEVOTHROID) 112 MCG tablet Take 1 tablet (112 mcg total) by mouth daily. 90 tablet 2  . meloxicam (MOBIC) 7.5 MG tablet Take 1 tablet (7.5 mg total) by mouth daily. 30 tablet 0  . omeprazole (PRILOSEC) 20 MG capsule Take 1 capsule (20 mg total) by mouth daily. 90 capsule 1  . simvastatin (ZOCOR) 40 MG tablet TAKE ONE TABLET BY MOUTH AT BEDTIME 90 tablet 2  . traMADol (ULTRAM) 50 MG tablet TAKE 1 TO  2 TABLETS BY MOUTH EVERY 12 HOURS AS NEEDED FOR PAIN 120 tablet 1   No current facility-administered medications for this visit.     Review of Systems See HPI     Objective:   Physical Exam BP 140/72   Pulse 75   Temp 98 F (36.7 C) (Oral)   Ht 6' (1.829 m)   Wt (!) 304 lb 9.6 oz (138.2 kg)   SpO2 96%   BMI 41.31 kg/m  Gen: NAD, alert, cooperative with exam, well-appearing  HEENT: NCAT, PERRL, clear conjunctiva, oropharynx with mild pharyngeal erythema and no exudate, supple neck, tympanic membranes normal bilaterally,  +Tenderness to palpation over maxillary sinuses.  No  cervical lymphadenopathy. CV: RRR, good S1/S2, no murmur, no edema, capillary refill brisk  Resp: CTABL, no wheezes, non-labored Abd: SNTND, BS present, no guarding or organomegaly Skin: no rashes, normal turgor  Neuro: no gross deficits.  Psych: good insight, alert and oriented     Assessment & Plan:   1. Acute Sinusitis - given 5 day history of fevers at home when she is not taking Tylenol and maxillary tenderness on exam, I believe the patient is having an episode of acute sinusitis.  We will treat with antibiotics.  She should continue Flonase and over-the-counter remedies for cough. - amoxicillin (AMOXIL) 500 MG capsule; Take 1 capsule (500 mg total) by mouth 2 (two) times daily.  Dispense: 14 capsule; Refill: 0 -Return precautions discussed including inability to stay hydrated by mouth or symptoms of throat swelling/voice changes which would require urgent evaluation  Howard Pouch, MD,MS,  PGY3 05/14/2018 2:02 PM

## 2018-05-19 ENCOUNTER — Other Ambulatory Visit: Payer: Self-pay

## 2018-05-19 MED ORDER — FLUTICASONE PROPIONATE 50 MCG/ACT NA SUSP: 2.0000 | Freq: Every day | NASAL | 12 refills | Status: DC

## 2018-05-27 ENCOUNTER — Ambulatory Visit (INDEPENDENT_AMBULATORY_CARE_PROVIDER_SITE_OTHER): Payer: Medicare Other

## 2018-05-27 VITALS — BP 122/80 | HR 71 | Temp 98.4°F | Ht 72.0 in | Wt 304.0 lb

## 2018-05-27 DIAGNOSIS — Z0000 Encounter for general adult medical examination without abnormal findings: Secondary | ICD-10-CM

## 2018-05-27 NOTE — Patient Instructions (Addendum)
Brianna Wheeler , Thank you for taking time to come for your Medicare Wellness Visit. I appreciate your ongoing commitment to your health goals. Please review the following plan we discussed and let me know if I can assist you in the future.   Please keep your appointment with Dr. Gwendolyn Grant for 06/04/18.  These are the goals we discussed: Goals    . Weight (lb) < 250 lb (113.4 kg)       This is a list of the screening recommended for you and due dates:  Health Maintenance  Topic Date Due  . Pap Smear  03/12/2012  . Urine Protein Check  06/05/2017  . Colon Cancer Screening  03/26/2019*  . Hemoglobin A1C  09/23/2018  . Eye exam for diabetics  10/02/2018  . Lipid (cholesterol) test  12/21/2018  . Mammogram  03/22/2019  . Complete foot exam   03/26/2019  . Tetanus Vaccine  12/30/2024  . Flu Shot  Completed  . Pneumococcal vaccine  Completed  .  Hepatitis C: One time screening is recommended by Center for Disease Control  (CDC) for  adults born from [birth year] through 1967.   Completed  . HIV Screening  Completed  *Topic was postponed. The date shown is not the original due date.     Diabetes Mellitus and Nutrition When you have diabetes (diabetes mellitus), it is very important to have healthy eating habits because your blood sugar (glucose) levels are greatly affected by what you eat and drink. Eating healthy foods in the appropriate amounts, at about the same times every day, can help you:  Control your blood glucose.  Lower your risk of heart disease.  Improve your blood pressure.  Reach or maintain a healthy weight.  Every person with diabetes is different, and each person has different needs for a meal plan. Your health care provider may recommend that you work with a diet and nutrition specialist (dietitian) to make a meal plan that is best for you. Your meal plan may vary depending on factors such as:  The calories you need.  The medicines you take.  Your weight.  Your blood  glucose, blood pressure, and cholesterol levels.  Your activity level.  Other health conditions you have, such as heart or kidney disease.  How do carbohydrates affect me? Carbohydrates affect your blood glucose level more than any other type of food. Eating carbohydrates naturally increases the amount of glucose in your blood. Carbohydrate counting is a method for keeping track of how many carbohydrates you eat. Counting carbohydrates is important to keep your blood glucose at a healthy level, especially if you use insulin or take certain oral diabetes medicines. It is important to know how many carbohydrates you can safely have in each meal. This is different for every person. Your dietitian can help you calculate how many carbohydrates you should have at each meal and for snack. Foods that contain carbohydrates include:  Bread, cereal, rice, pasta, and crackers.  Potatoes and corn.  Peas, beans, and lentils.  Milk and yogurt.  Fruit and juice.  Desserts, such as cakes, cookies, ice cream, and candy.  How does alcohol affect me? Alcohol can cause a sudden decrease in blood glucose (hypoglycemia), especially if you use insulin or take certain oral diabetes medicines. Hypoglycemia can be a life-threatening condition. Symptoms of hypoglycemia (sleepiness, dizziness, and confusion) are similar to symptoms of having too much alcohol. If your health care provider says that alcohol is safe for you, follow these guidelines:  Limit alcohol intake to no more than 1 drink per day for nonpregnant women and 2 drinks per day for men. One drink equals 12 oz of beer, 5 oz of wine, or 1 oz of hard liquor.  Do not drink on an empty stomach.  Keep yourself hydrated with water, diet soda, or unsweetened iced tea.  Keep in mind that regular soda, juice, and other mixers may contain a lot of sugar and must be counted as carbohydrates.  What are tips for following this plan? Reading food  labels  Start by checking the serving size on the label. The amount of calories, carbohydrates, fats, and other nutrients listed on the label are based on one serving of the food. Many foods contain more than one serving per package.  Check the total grams (g) of carbohydrates in one serving. You can calculate the number of servings of carbohydrates in one serving by dividing the total carbohydrates by 15. For example, if a food has 30 g of total carbohydrates, it would be equal to 2 servings of carbohydrates.  Check the number of grams (g) of saturated and trans fats in one serving. Choose foods that have low or no amount of these fats.  Check the number of milligrams (mg) of sodium in one serving. Most people should limit total sodium intake to less than 2,300 mg per day.  Always check the nutrition information of foods labeled as "low-fat" or "nonfat". These foods may be higher in added sugar or refined carbohydrates and should be avoided.  Talk to your dietitian to identify your daily goals for nutrients listed on the label. Shopping  Avoid buying canned, premade, or processed foods. These foods tend to be high in fat, sodium, and added sugar.  Shop around the outside edge of the grocery store. This includes fresh fruits and vegetables, bulk grains, fresh meats, and fresh dairy. Cooking  Use low-heat cooking methods, such as baking, instead of high-heat cooking methods like deep frying.  Cook using healthy oils, such as olive, canola, or sunflower oil.  Avoid cooking with butter, cream, or high-fat meats. Meal planning  Eat meals and snacks regularly, preferably at the same times every day. Avoid going long periods of time without eating.  Eat foods high in fiber, such as fresh fruits, vegetables, beans, and whole grains. Talk to your dietitian about how many servings of carbohydrates you can eat at each meal.  Eat 4-6 ounces of lean protein each day, such as lean meat, chicken,  fish, eggs, or tofu. 1 ounce is equal to 1 ounce of meat, chicken, or fish, 1 egg, or 1/4 cup of tofu.  Eat some foods each day that contain healthy fats, such as avocado, nuts, seeds, and fish. Lifestyle   Check your blood glucose regularly.  Exercise at least 30 minutes 5 or more days each week, or as told by your health care provider.  Take medicines as told by your health care provider.  Do not use any products that contain nicotine or tobacco, such as cigarettes and e-cigarettes. If you need help quitting, ask your health care provider.  Work with a Veterinary surgeon or diabetes educator to identify strategies to manage stress and any emotional and social challenges. What are some questions to ask my health care provider?  Do I need to meet with a diabetes educator?  Do I need to meet with a dietitian?  What number can I call if I have questions?  When are the best times to  check my blood glucose? Where to find more information:  American Diabetes Association: diabetes.org/food-and-fitness/food  Academy of Nutrition and Dietetics: https://www.vargas.com/  [employer] of Diabetes and Digestive and Kidney Diseases (NIH): FindJewelers.cz Summary  A healthy meal plan will help you control your blood glucose and maintain a healthy lifestyle.  Working with a diet and nutrition specialist (dietitian) can help you make a meal plan that is best for you.  Keep in mind that carbohydrates and alcohol have immediate effects on your blood glucose levels. It is important to count carbohydrates and to use alcohol carefully. This information is not intended to replace advice given to you by your health care provider. Make sure you discuss any questions you have with your health care provider. Document Released: 04/05/2005 Document Revised: 08/13/2016 Document Reviewed:  08/13/2016 Elsevier Interactive Patient Education  2018 [employer].   Pap Test Why am I having this test? A pap test is sometimes called a pap smear. It is a screening test that is used to check for signs of cancer of the vagina, cervix, and uterus. The test can also identify the presence of infection or precancerous changes. Your health care provider will likely recommend you have this test done on a regular basis. This test may be done:  Every 3 years, starting at age 63.  Every 5 years, in combination with testing for the presence of human papillomavirus (HPV).  More or less often depending on other medical conditions.  What kind of sample is taken? Using a small cotton swab, plastic spatula, or brush, your health care provider will collect a sample of cells from the surface of your cervix. Your cervix is the opening to your uterus, also called a womb. Secretions from the cervix and vagina may also be collected. How do I prepare for this test?  Be aware of where you are in your menstrual cycle. You may be asked to reschedule the test if you are menstruating on the day of the test.  You may need to reschedule if you have a known vaginal infection on the day of the test.  You may be asked to avoid douching or taking a bath the day before or the day of the test.  Some medicines can cause abnormal test results, such as digitalis and tetracycline. Talk with your health care provider before your test if you take one of these medicines. What do the results mean? Abnormal test results may indicate a number of health conditions. These may include:  Cancer. Although pap test results cannot be used to diagnose cancer of the cervix, vagina, or uterus, they may suggest the possibility of cancer. Further tests would be required to determine if cancer is present.  Sexually transmitted disease.  Fungal infection.  Parasite infection.  Herpes infection.  A condition causing or contributing  to infertility.  It is your responsibility to obtain your test results. Ask the lab or department performing the test when and how you will get your results. Contact your health care provider to discuss any questions you have about your results. Talk with your health care provider to discuss your results, treatment options, and if necessary, the need for more tests. Talk with your health care provider if you have any questions about your results. This information is not intended to replace advice given to you by your health care provider. Make sure you discuss any questions you have with your health care provider. Document Released: 09/29/2002 Document Revised: 03/14/2016 Document Reviewed: 11/30/2013 Elsevier Interactive  Patient Education  [employer].  Fall Prevention in the [facility] can cause injuries. They can happen to people of all ages. There are many things you can do to make your home safe and to help prevent falls. What can I do on the outside of my home?  Regularly fix the edges of walkways and driveways and fix any cracks.  Remove anything that might make you trip as you walk through a door, such as a raised step or threshold.  Trim any bushes or trees on the path to your home.  Use bright outdoor lighting.  Clear any walking paths of anything that might make someone trip, such as rocks or tools.  Regularly check to see if handrails are loose or broken. Make sure that both sides of any steps have handrails.  Any raised decks and porches should have guardrails on the edges.  Have any leaves, snow, or ice cleared regularly.  Use sand or salt on walking paths during winter.  Clean up any spills in your garage right away. This includes oil or grease spills. What can I do in the bathroom?  Use night lights.  Install grab bars by the toilet and in the tub and shower. Do not use towel bars as grab bars.  Use non-skid mats or decals in the tub or shower.  If you  need to sit down in the shower, use a plastic, non-slip stool.  Keep the floor dry. Clean up any water that spills on the floor as soon as it happens.  Remove soap buildup in the tub or shower regularly.  Attach bath mats securely with double-sided non-slip rug tape.  Do not have throw rugs and other things on the floor that can make you trip. What can I do in the bedroom?  Use night lights.  Make sure that you have a light by your bed that is easy to reach.  Do not use any sheets or blankets that are too big for your bed. They should not hang down onto the floor.  Have a firm chair that has side arms. You can use this for support while you get dressed.  Do not have throw rugs and other things on the floor that can make you trip. What can I do in the kitchen?  Clean up any spills right away.  Avoid walking on wet floors.  Keep items that you use a lot in easy-to-reach places.  If you need to reach something above you, use a strong step stool that has a grab bar.  Keep electrical cords out of the way.  Do not use floor polish or wax that makes floors slippery. If you must use wax, use non-skid floor wax.  Do not have throw rugs and other things on the floor that can make you trip. What can I do with my stairs?  Do not leave any items on the stairs.  Make sure that there are handrails on both sides of the stairs and use them. Fix handrails that are broken or loose. Make sure that handrails are as long as the stairways.  Check any carpeting to make sure that it is firmly attached to the stairs. Fix any carpet that is loose or worn.  Avoid having throw rugs at the top or bottom of the stairs. If you do have throw rugs, attach them to the floor with carpet tape.  Make sure that you have a light switch at the top of the stairs and the  bottom of the stairs. If you do not have them, ask someone to add them for you. What else can I do to help prevent falls?  Wear shoes  that: ? Do not have high heels. ? Have rubber bottoms. ? Are comfortable and fit you well. ? Are closed at the toe. Do not wear sandals.  If you use a stepladder: ? Make sure that it is fully opened. Do not climb a closed stepladder. ? Make sure that both sides of the stepladder are locked into place. ? Ask someone to hold it for you, if possible.  Clearly mark and make sure that you can see: ? Any grab bars or handrails. ? First and last steps. ? Where the edge of each step is.  Use tools that help you move around (mobility aids) if they are needed. These include: ? Canes. ? Walkers. ? Scooters. ? Crutches.  Turn on the lights when you go into a dark area. Replace any light bulbs as soon as they burn out.  Set up your furniture so you have a clear path. Avoid moving your furniture around.  If any of your floors are uneven, fix them.  If there are any pets around you, be aware of where they are.  Review your medicines with your doctor. Some medicines can make you feel dizzy. This can increase your chance of falling. Ask your doctor what other things that you can do to help prevent falls. This information is not intended to replace advice given to you by your health care provider. Make sure you discuss any questions you have with your health care provider. Document Released: 05/05/2009 Document Revised: 12/15/2015 Document Reviewed: 08/13/2014 Elsevier Interactive Patient Education  [employer].

## 2018-05-27 NOTE — Progress Notes (Signed)
Subjective:   Brianna Wheeler is a 62 y.o. female who presents for Medicare Annual (Subsequent) preventive examination.  Review of Systems:  Physical assessment deferred to PCP.  Cardiac Risk Factors include: diabetes mellitus;hypertension;obesity (BMI >30kg/m2);sedentary lifestyle     Objective:     Vitals: BP 122/80   Pulse 71   Temp 98.4 F (36.9 C) (Oral)   Ht 6' (1.829 m)   Wt (!) 304 lb (137.9 kg)   SpO2 96%   BMI 41.23 kg/m   Body mass index is 41.23 kg/m.  Advanced Directives 05/27/2018 05/14/2018 02/06/2018 05/16/2017 04/15/2017 01/08/2017 01/02/2017  Does Patient Have a Medical Advance Directive? No No No No No No No  Does patient want to make changes to medical advance directive? - - - - - - No - Patient declined  Would patient like information on creating a medical advance directive? No - Patient declined No - Patient declined No - Patient declined No - Patient declined No - Patient declined No - Patient declined No - Patient declined    Tobacco Social History   Tobacco Use  Smoking Status Never Smoker  Smokeless Tobacco Never Used     Counseling given: Yes   Clinical Intake:  Pre-visit preparation completed: Yes  Pain : 0-10 Pain Score: 10-Worst pain ever Pain Type: Chronic pain Pain Location: Back Pain Orientation: Right Pain Onset: More than a month ago Pain Frequency: Constant     Nutritional Status: BMI > 30  Obese Diabetes: Yes CBG done?: No Did pt. bring in CBG monitor from home?: No  How often do you need to have someone help you when you read instructions, pamphlets, or other written materials from your doctor or pharmacy?: 1 - Never What is the last grade level you completed in school?: 11th grade  Interpreter Needed?: No     Past Medical History:  Diagnosis Date  . Diabetes mellitus   . Hyperlipidemia   . Hypertension   . Left foot pain 2005   Chronic injury -- multiple broken bones and torn ligaments after accident in  garage   Past Surgical History:  Procedure Laterality Date  . EYE SURGERY    . LUMBAR DISC SURGERY    . TUBAL LIGATION     Family History  Problem Relation Age of Onset  . Cancer Sister        brain  . Epilepsy Son    Social History   Socioeconomic History  . Marital status: Widowed    Spouse name: Not on file  . Number of children: 1  . Years of education: 11.5  . Highest education level: 11th grade  Occupational History  . 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 Financial resource strain: Not hard at all  . Food insecurity:    Worry: Never true    Inability: Never true  . Transportation needs:    Medical: No    Non-medical: No  Tobacco Use  . Smoking status: Never Smoker  . Smokeless tobacco: Never Used  Substance and Sexual Activity  . Alcohol use: No  . Drug use: No  . Sexual activity: Not Currently  Lifestyle  . Physical activity:    Days per week: 2 days    Minutes per session: 10 min  . Stress: Not at all  Relationships  . Social connections:    Talks on phone: Three times a week    Gets together: Twice a week  Attends religious service: More than 4 times per year    Active member of club or organization: Yes    Attends meetings of clubs or organizations: 1 to 4 times per year    Relationship status: Widowed  Other Topics Concern  . Not on file  Social History Narrative   Who lives with you: Lives in home with handicap son. One level home, has ramp at front door with handrails. Smoke alarms, CO2 detector, no grab bars in bathroom. No throw rugs on floor.    Any pets: none   Diet: pt has a varied diet. Tries to follow diabetic diet (low carbs), doesn't eat bread.    Exercise: Pt reports walking across yard several times daily.   Seatbelts: Pt reports wearing seatbelt when in vehicles.       Hobbies: cleaning house, likes home and garden things.  Spending time with friends.       Outpatient Encounter  Medications as of 05/27/2018  Medication Sig  . albuterol (PROAIR HFA) 108 (90 Base) MCG/ACT inhaler Inhale 2 puffs into the lungs every 6 (six) hours as needed for wheezing or shortness of breath.  Marland Kitchen aspirin (ASPIR-81) 81 MG EC tablet Take 81 mg by mouth daily.    . Calcium Carb-Cholecalciferol (CALCIUM 1000 + D PO) Take by mouth.  . cyclobenzaprine (FLEXERIL) 10 MG tablet Take 1 tablet (10 mg total) by mouth 2 (two) times daily.  Marland Kitchen EPINEPHrine (EPI-PEN) 0.3 mg/0.3 mL DEVI Inject 0.3 mLs (0.3 mg total) into the muscle once.  . fexofenadine (ALLEGRA) 180 MG tablet Take 180 mg by mouth daily.    . fluticasone (FLONASE) 50 MCG/ACT nasal spray Place 2 sprays into both nostrils daily. Place 2 sprays in each nostril daily  . gabapentin (NEURONTIN) 300 MG capsule Take 1 capsule (300 mg total) by mouth at bedtime.  . hydrochlorothiazide (HYDRODIURIL) 25 MG tablet Take 1 tablet (25 mg total) by mouth daily.  Marland Kitchen levothyroxine (SYNTHROID, LEVOTHROID) 112 MCG tablet Take 1 tablet (112 mcg total) by mouth daily.  . Multiple Vitamin (MULTIVITAMIN) tablet Take 1 tablet by mouth daily.  Marland Kitchen omeprazole (PRILOSEC) 20 MG capsule Take 1 capsule (20 mg total) by mouth daily.  . simvastatin (ZOCOR) 40 MG tablet TAKE ONE TABLET BY MOUTH AT BEDTIME  . traMADol (ULTRAM) 50 MG tablet TAKE 1 TO 2 TABLETS BY MOUTH EVERY 12 HOURS AS NEEDED FOR PAIN  . Elastic Bandages & Supports (LUMBAR BACK BRACE/SUPPORT PAD) MISC Use as needed for support of your back and pain relief. (Patient not taking: Reported on 01/02/2017)  . meloxicam (MOBIC) 7.5 MG tablet Take 1 tablet (7.5 mg total) by mouth daily. (Patient not taking: Reported on 05/27/2018)  . [DISCONTINUED] amoxicillin (AMOXIL) 500 MG capsule Take 1 capsule (500 mg total) by mouth 2 (two) times daily. (Patient not taking: Reported on 05/27/2018)   No facility-administered encounter medications on file as of 05/27/2018.     Activities of Daily Living In your present state of  health, do you have any difficulty performing the following activities: 05/27/2018  Hearing? N  Vision? N  Comment eye exam up to date  Difficulty concentrating or making decisions? N  Walking or climbing stairs? Y  Comment sometimes due to back pain  Dressing or bathing? N  Doing errands, shopping? N  Preparing Food and eating ? N  Using the Toilet? N  In the past six months, have you accidently leaked urine? N  Do you have problems with loss of  bowel control? N  Managing your Medications? N  Managing your Finances? N  Housekeeping or managing your Housekeeping? N  Some recent data might be hidden    Patient Care Team: Tobey Grim, MD as PCP - General (Family Medicine) Antony Contras, MD (Ophthalmology) Eber Jones, MD as Referring Physician (Ophthalmology)    Assessment:   This is a routine wellness examination for [place].  Exercise Activities and Dietary recommendations Current Exercise Habits: The patient does not participate in regular exercise at present, Exercise limited by: orthopedic condition(s)  Goals    . Weight (lb) < 250 lb (113.4 kg)       Fall Risk Fall Risk  05/27/2018 05/14/2018 02/06/2018 01/08/2017 12/05/2016  Falls in the past year? 0 No No No Yes  Number falls in past yr: - - - - -  Injury with Fall? - - - - -  Comment - - - - -  Risk for fall due to : - - - - -   Is the patient's home free of loose throw rugs in walkways, pet beds, electrical cords, etc?   yes      Grab bars in the bathroom? no      Handrails on the stairs?   yes      Adequate lighting?   yes  Depression Screen PHQ 2/9 Scores 05/27/2018 05/14/2018 03/25/2018 02/06/2018  PHQ - 2 Score 0 0 0 0  Exception Documentation - - - -     Cognitive Function MMSE - Mini Mental State Exam 05/27/2018 09/24/2013 03/18/2013 02/12/2012  Orientation to time 5 5 5 5   Orientation to Place 5 5 5 5   Registration 3 3 3 3   Attention/ Calculation 5 5 5 5   Recall 3 2 2 3   Language- name 2 objects 2  2 2 2   Language- repeat 1 1 1 1   Language- follow 3 step command 3 3 3 3   Language- read & follow direction 1 1 1 1   Write a sentence 1 1 1 1   Copy design 1 1 1 1   Total score 30 29 29 30      6CIT Screen 05/27/2018  What Year? 0 points  What month? 0 points  What time? 0 points  Count back from 20 0 points  Months in reverse 0 points  Repeat phrase 0 points  Total Score 0    Immunization History  Administered Date(s) Administered  . Influenza Split 04/23/2011, 04/17/2012  . Influenza Whole 04/23/2007, 04/23/2008, 04/28/2009, 04/25/2010  . Influenza,inj,Quad PF,6+ Mos 03/19/2013, 04/01/2014, 03/31/2015, 05/01/2016, 04/02/2017, 03/25/2018  . Pneumococcal Polysaccharide-23 05/10/2011  . Td 10/22/2003  . Tdap 12/31/2014    Screening Tests Health Maintenance  Topic Date Due  . PAP SMEAR  03/12/2012  . URINE MICROALBUMIN  06/05/2017  . COLONOSCOPY  03/26/2019 (Originally 06/23/2004)  . HEMOGLOBIN A1C  09/23/2018  . OPHTHALMOLOGY EXAM  10/02/2018  . LIPID PANEL  12/21/2018  . MAMMOGRAM  03/22/2019  . FOOT EXAM  03/26/2019  . TETANUS/TDAP  12/30/2024  . INFLUENZA VACCINE  Completed  . PNEUMOCOCCAL POLYSACCHARIDE VACCINE AGE 67-64 HIGH RISK  Completed  . Hepatitis C Screening  Completed  . HIV Screening  Completed    Cancer Screenings: Lung: Low Dose CT Chest recommended if Age 16-80 years, 30 pack-year currently smoking OR have quit w/in 15years. Patient does not qualify. Breast:  Up to date on Mammogram? Yes   Up to date of Bone Density/Dexa? Yes Colorectal: up to date  Additional Screenings: :  Hepatitis C Screening: complete     Plan:  Patient has completed a urine microalbumin for [facility] nurse. She will bring a copy. Patient wants to defer Pap until she can tolerate it with her back and sciatic pain.  Patient continues to complain of significant back and right hip/leg pain. Was supposed to f/u with PCP in October. F/U appt made for next week.    I have personally  reviewed and noted the following in the patient's chart:   . Medical and social history . Use of alcohol, tobacco or illicit drugs  . Current medications and supplements . Functional ability and status . Nutritional status . Physical activity . Advanced directives . List of other physicians . Hospitalizations, surgeries, and ER visits in previous 12 months . Vitals . Screenings to include cognitive, depression, and falls . Referrals and appointments  In addition, I have reviewed and discussed with patient certain preventive protocols, quality metrics, and best practice recommendations. A written personalized care plan for preventive services as well as general preventive health recommendations were provided to patient.     Nigel Mormon, RN  05/27/2018

## 2018-06-04 ENCOUNTER — Ambulatory Visit (INDEPENDENT_AMBULATORY_CARE_PROVIDER_SITE_OTHER): Payer: Medicare Other | Admitting: Family Medicine

## 2018-06-04 ENCOUNTER — Encounter: Payer: Self-pay | Admitting: Family Medicine

## 2018-06-04 ENCOUNTER — Other Ambulatory Visit: Payer: Self-pay

## 2018-06-04 VITALS — BP 122/64 | HR 79 | Temp 98.2°F | Ht 72.0 in | Wt 306.0 lb

## 2018-06-04 DIAGNOSIS — M545 Low back pain, unspecified: Secondary | ICD-10-CM

## 2018-06-04 DIAGNOSIS — G8929 Other chronic pain: Secondary | ICD-10-CM

## 2018-06-04 DIAGNOSIS — M5441 Lumbago with sciatica, right side: Secondary | ICD-10-CM | POA: Diagnosis not present

## 2018-06-04 MED ORDER — CYCLOBENZAPRINE HCL 10 MG PO TABS: 10.0000 mg | ORAL_TABLET | Freq: Two times a day (BID) | ORAL | 1 refills | Status: DC | PRN

## 2018-06-04 MED ORDER — KETOROLAC TROMETHAMINE 30 MG/ML IJ SOLN
30.0000 mg | Freq: Once | INTRAMUSCULAR | Status: AC
  Administered 2018-06-04: 30 mg via INTRAMUSCULAR

## 2018-06-04 NOTE — Patient Instructions (Signed)
It was good to see you again today  We've scheduled you for an MRI.  I'll call you with these results.   Toradol shot today.  I've refilled your flexeril.    Pick up some capsaicin cream over the counter.  Use this on your lower back.  Come back and see me in 2 weeks to make sure you're ok

## 2018-06-04 NOTE — Addendum Note (Signed)
Addended by: Lamonte SakaiZIMMERMAN RUMPLE, Shimon Trowbridge D on: 06/04/2018 02:53 PM   Modules accepted: Orders

## 2018-06-04 NOTE — Progress Notes (Signed)
Subjective:    Brianna Wheeler is a 62 y.o. female who presents to [facility] today for back pain:  1.  Acute on chronic back pain:  Ongoing issue for patient.  She had setback about a week ago after she moved multiple bags of mulch from her front yard.  Even before that she was having frequent flareups of her back pain.  She has been taking tramadol and gabapentin with some relief.  She is also experiencing relief with Flexeril.  However she has been out of this for about the past week.  Describes sharp stabbing pain bilateral lower back.  Worse when she is bending over.  Basically has pain anytime she tries to move.  Some radiation to her right leg which is her typical sciatic pain.  No lower extremity weakness.  No numbness or paresthesias just the pain.  No bladder bowel incontinence.  No falls.   ROS as above per HPI.   The following portions of the patient's history were reviewed and updated as appropriate: allergies, current medications, past medical history, family and social history, and problem list. Patient is a nonsmoker.    PMH reviewed.  Past Medical History:  Diagnosis Date  . Diabetes mellitus   . Hyperlipidemia   . Hypertension   . Left foot pain 2005   Chronic injury -- multiple broken bones and torn ligaments after accident in garage   Past Surgical History:  Procedure Laterality Date  . EYE SURGERY    . LUMBAR DISC SURGERY    . TUBAL LIGATION      Medications reviewed. Current Outpatient Medications  Medication Sig Dispense Refill  . albuterol (PROAIR HFA) 108 (90 Base) MCG/ACT inhaler Inhale 2 puffs into the lungs every 6 (six) hours as needed for wheezing or shortness of breath. 8.5 g 1  . aspirin (ASPIR-81) 81 MG EC tablet Take 81 mg by mouth daily.      . Calcium Carb-Cholecalciferol (CALCIUM 1000 + D PO) Take by mouth.    . cyclobenzaprine (FLEXERIL) 10 MG tablet Take 1 tablet (10 mg total) by mouth 2 (two) times daily. 30 tablet 1  . Elastic Bandages & Supports  (LUMBAR BACK BRACE/SUPPORT PAD) MISC Use as needed for support of your back and pain relief. (Patient not taking: Reported on 01/02/2017) 1 each 0  . EPINEPHrine (EPI-PEN) 0.3 mg/0.3 mL DEVI Inject 0.3 mLs (0.3 mg total) into the muscle once. 2 Device 0  . fexofenadine (ALLEGRA) 180 MG tablet Take 180 mg by mouth daily.      . fluticasone (FLONASE) 50 MCG/ACT nasal spray Place 2 sprays into both nostrils daily. Place 2 sprays in each nostril daily 32 g 12  . gabapentin (NEURONTIN) 300 MG capsule Take 1 capsule (300 mg total) by mouth at bedtime. 30 capsule 1  . hydrochlorothiazide (HYDRODIURIL) 25 MG tablet Take 1 tablet (25 mg total) by mouth daily. 90 tablet 2  . levothyroxine (SYNTHROID, LEVOTHROID) 112 MCG tablet Take 1 tablet (112 mcg total) by mouth daily. 90 tablet 2  . meloxicam (MOBIC) 7.5 MG tablet Take 1 tablet (7.5 mg total) by mouth daily. (Patient not taking: Reported on 05/27/2018) 30 tablet 0  . Multiple Vitamin (MULTIVITAMIN) tablet Take 1 tablet by mouth daily.    Marland Kitchen omeprazole (PRILOSEC) 20 MG capsule Take 1 capsule (20 mg total) by mouth daily. 90 capsule 1  . simvastatin (ZOCOR) 40 MG tablet TAKE ONE TABLET BY MOUTH AT BEDTIME 90 tablet 2  . traMADol (ULTRAM) 50  MG tablet TAKE 1 TO 2 TABLETS BY MOUTH EVERY 12 HOURS AS NEEDED FOR PAIN 120 tablet 1   No current facility-administered medications for this visit.      Objective:   Physical Exam BP 122/64   Pulse 79   Temp 98.2 F (36.8 C) (Oral)   Ht 6' (1.829 m)   Wt (!) 306 lb (138.8 kg)   SpO2 97%   BMI 41.50 kg/m  Gen:  Alert, cooperative patient who appears stated age in no acute distress.  Vital signs reviewed. HEENT: EOMI,  MMM Cardiac:  Regular rate and rhythm without murmur auscultated.  Good S1/S2. Pulm:  Clear to auscultation bilaterally with good air movement.  No wheezes or rales noted.   Back:  Normal-appearing skin overlying back, no bruising.  Spine with normal alignment and no deformity.  No tenderness to  vertebral process palpation.  Paraspinous muscles are TTP and with spasm BL lower lumbar region.   Range of motion full at neck and decreased forward flexion in lumbar sacral regions.  Straight leg raise -- not able to cooperate due to obesity and pain.   Neuro:  Sensation and motor function 5/5 bilateral lower extremities.  No limp, though she has to walk with a cane.      No results found for this or any previous visit (from the past 72 hour(s)).

## 2018-06-04 NOTE — Assessment & Plan Note (Signed)
Acute on chronic issue.  Continue gabapentin and tramadol for pain relief.  Refill of flexeril today.   Obtaining MRI of her back as she's had ongoing issues with back pain for quite some time.  Years at least, though it's been much more frequent since her lumbar compression fracture.  Pain control until MRI.  See AVS.

## 2018-06-17 ENCOUNTER — Telehealth: Payer: Self-pay | Admitting: *Deleted

## 2018-06-17 NOTE — Telephone Encounter (Signed)
Pt informed of below and she said she would wear them both.  Instructed her that if it did not help and continued to swell that she should call and we could get her in to be evaluated.  Lamonte SakaiZimmerman Rumple, April D, [place]

## 2018-06-17 NOTE — Telephone Encounter (Signed)
Pt lm on nurse line.  She wants to know if she is to wear the compression socks on both legs or just her left one when it is swelling. Fleeger, Maryjo RochesterJessica Dawn, CMA

## 2018-06-17 NOTE — Telephone Encounter (Signed)
She should wear compression stockings on both of her legs.  However, if one of her legs is more swollen than the other, she should come in to be evaluated to make sure this isn't a blood clot.  I don't remember her actually bringing this up with me last visit.  Thanks, JW

## 2018-06-21 ENCOUNTER — Other Ambulatory Visit: Payer: Self-pay | Admitting: Family Medicine

## 2018-06-21 DIAGNOSIS — M5441 Lumbago with sciatica, right side: Secondary | ICD-10-CM

## 2018-06-26 ENCOUNTER — Ambulatory Visit
Admission: RE | Admit: 2018-06-26 | Discharge: 2018-06-26 | Disposition: A | Discharge status: Home / Self Care | Payer: Medicare Other | Source: Ambulatory Visit | Attending: Family Medicine | Admitting: Family Medicine

## 2018-06-26 DIAGNOSIS — M545 Low back pain, unspecified: Secondary | ICD-10-CM

## 2018-06-26 DIAGNOSIS — M5136 Other intervertebral disc degeneration, lumbar region: Secondary | ICD-10-CM | POA: Diagnosis not present

## 2018-06-26 DIAGNOSIS — G8929 Other chronic pain: Secondary | ICD-10-CM

## 2018-06-30 ENCOUNTER — Telehealth: Payer: Self-pay

## 2018-06-30 DIAGNOSIS — M5441 Lumbago with sciatica, right side: Principal | ICD-10-CM

## 2018-06-30 DIAGNOSIS — G8929 Other chronic pain: Secondary | ICD-10-CM

## 2018-06-30 NOTE — Telephone Encounter (Signed)
Pt called nurse line requesting the results of her 12/5 MRI. Please advise.

## 2018-07-01 NOTE — Addendum Note (Signed)
Addended byGwendolyn Grant: Scherrie Seneca, Newt LukesJEFFREY H on: 07/01/2018 12:28 PM   Modules accepted: Orders

## 2018-07-01 NOTE — Telephone Encounter (Signed)
Called and spoke with patient re: MRI results.  Still in significant back pain.  She would like to be referred to spinal specialist.  May benefit from corticosteroid/epidural injections.  Will place referral today

## 2018-07-01 NOTE — Telephone Encounter (Signed)
Pt calling to check status. Tamana Hatfield Dawn, CMA  

## 2018-07-09 ENCOUNTER — Ambulatory Visit (INDEPENDENT_AMBULATORY_CARE_PROVIDER_SITE_OTHER): Payer: Medicare Other

## 2018-07-09 DIAGNOSIS — M150 Primary generalized (osteo)arthritis: Secondary | ICD-10-CM

## 2018-07-09 DIAGNOSIS — M159 Polyosteoarthritis, unspecified: Secondary | ICD-10-CM

## 2018-07-09 MED ORDER — KETOROLAC TROMETHAMINE 60 MG/2ML IM SOLN
60.0000 mg | Freq: Once | INTRAMUSCULAR | Status: AC
  Administered 2018-07-09: 60 mg via INTRAMUSCULAR

## 2018-07-09 NOTE — Progress Notes (Signed)
Ketorolac 60mg  injection given to pt per Dr. [place]yn [place]. Pt tolerated well. Sunday SpillersSharon T Saunders, CMA

## 2018-07-17 NOTE — Progress Notes (Signed)
I have reviewed the documentation by Ples SpecterAlisa Brake and agree.

## 2018-07-25 DIAGNOSIS — G8929 Other chronic pain: Secondary | ICD-10-CM | POA: Diagnosis not present

## 2018-07-25 DIAGNOSIS — S32030S Wedge compression fracture of third lumbar vertebra, sequela: Secondary | ICD-10-CM | POA: Diagnosis not present

## 2018-07-25 DIAGNOSIS — M545 Low back pain: Secondary | ICD-10-CM | POA: Diagnosis not present

## 2018-07-25 DIAGNOSIS — M4316 Spondylolisthesis, lumbar region: Secondary | ICD-10-CM | POA: Diagnosis not present

## 2018-07-28 ENCOUNTER — Telehealth: Payer: Self-pay | Admitting: Family Medicine

## 2018-07-28 NOTE — Telephone Encounter (Signed)
Pt came in office requesting a letter of excusal from jury service due to her medical condition. Best phone # to contact is [phone].

## 2018-07-29 ENCOUNTER — Other Ambulatory Visit: Payer: Self-pay

## 2018-07-30 NOTE — Telephone Encounter (Signed)
I can write and have this done by the end of this week.

## 2018-08-01 ENCOUNTER — Other Ambulatory Visit: Payer: Self-pay | Admitting: Family Medicine

## 2018-08-01 DIAGNOSIS — M5441 Lumbago with sciatica, right side: Secondary | ICD-10-CM

## 2018-08-04 ENCOUNTER — Telehealth: Payer: Self-pay

## 2018-08-04 DIAGNOSIS — M5441 Lumbago with sciatica, right side: Secondary | ICD-10-CM

## 2018-08-04 NOTE — Telephone Encounter (Signed)
Patient left message that per her pharmacy Tramadol 50 mg is on backorder for approx 3 months. They have called other pharmacies looking for it.  Please advise what she should do.  Call back is [phone]  [name], RN [facility] RN)

## 2018-08-04 NOTE — Telephone Encounter (Signed)
White Team, have any other patient's mentioned this?    The alternative would be to go through [facility], where she should be able to get it.  I can send it there.

## 2018-08-05 MED ORDER — TRAMADOL HCL 50 MG PO TABS: ORAL_TABLET | ORAL | 1 refills | Status: DC

## 2018-08-05 MED FILL — traMADol HCL 50 MG TABS: 50 | 30 days supply | Qty: 120 | Fill #0

## 2018-08-06 NOTE — Telephone Encounter (Signed)
Completed.  See separate ntoe.

## 2018-08-24 ENCOUNTER — Other Ambulatory Visit: Payer: Self-pay | Admitting: Family Medicine

## 2018-08-25 ENCOUNTER — Ambulatory Visit (INDEPENDENT_AMBULATORY_CARE_PROVIDER_SITE_OTHER): Payer: Medicare Other | Admitting: Family Medicine

## 2018-08-25 VITALS — BP 130/80 | HR 81 | Temp 98.8°F | Wt 299.0 lb

## 2018-08-25 DIAGNOSIS — B9789 Other viral agents as the cause of diseases classified elsewhere: Secondary | ICD-10-CM | POA: Diagnosis not present

## 2018-08-25 DIAGNOSIS — J069 Acute upper respiratory infection, unspecified: Secondary | ICD-10-CM

## 2018-08-25 MED ORDER — ALBUTEROL SULFATE HFA 108 (90 BASE) MCG/ACT IN AERS: 2.0000 | INHALATION_SPRAY | Freq: Four times a day (QID) | RESPIRATORY_TRACT | 0 refills | Status: DC | PRN

## 2018-08-25 NOTE — Patient Instructions (Signed)
It was great meeting you today!  I am sorry you have been having so much trouble with your cough, congestion, sore throat.  I think you likely have a viral upper respiratory infection.  Unfortunately antibiotics do not treat viruses and your body must fight it off on its' own.  We treat your symptoms with over-the-counter medications and supportive measures.  Continue to hydrate well and eat when you can.  In regards to your cough and congestion I recommend picking up Robitussin-DM.  The guaifenesin should help with the congestion and the dextromorphan should help suppress your cough.  I would pick up Cepacol lozenges for your sore throat.  These tend not to burn as much if you already have a very sore throat.  I sent in refills for your albuterol inhaler.  You can pick these up while you are getting your over-the-counter medications.  If you are not better in about a week come back and please see Brianna Wheeler.

## 2018-08-28 NOTE — Assessment & Plan Note (Addendum)
Discussed viral versus bacterial etiology of symptoms.  Likely has viral, technically outside window for Tamiflu efficacy.  Supportive measures.  Can take Robitussin-DM for cough and wheeze, can take Cepacol lozenges for sore throat.  Will up as needed, return precautions given, discussed alarm signs with patient.  Refilled albuterol, patient takes this for asthma.  Use as needed for wheezing.

## 2018-08-28 NOTE — Progress Notes (Signed)
   HPI 63 year old Caucasian female who presents for 3-day history of cough, chills, fever.  States that the cough is been nonproductive.  She is afebrile in clinic but states that she has had subjective fevers at home.  She has had one episode of posttussive emesis.  She has been unable to sleep at night due to the cough.  Her symptoms are usually worse at night.  She has tried some over-the-counter medication such as Robitussin, Tylenol, "cough syrup".  She feels like her symptoms are about the same as they were when it started.  She has been able to keep down food and liquid.  Patient states he has had subjective wheezing at home.  She is out of her albuterol inhaler.  Requests refill for this.  CC: Cough, fever, chills   ROS:   Review of Systems See HPI for ROS.   CC, SH/smoking status, and VS noted  Objective: BP 130/80   Pulse 81   Temp 98.8 F (37.1 C) (Oral)   Wt 299 lb (135.6 kg)   SpO2 95%   BMI 40.55 kg/m  Gen: She is 75-year-old Caucasian female, no acute distress, resting comfortably in chair HEENT: Moist mucous membranes, no conjunctival injection CV: Regular rate rhythm, no murmur/rubs/gallops appreciated Resp: Clear to auscultation bilaterally, accessory muscle use Neuro: Alert and oriented, Speech clear, No gross deficits   Assessment and plan:  Viral URI with cough Discussed viral versus bacterial etiology of symptoms.  Likely has viral, technically outside window for Tamiflu efficacy.  Supportive measures.  Can take Robitussin-DM for cough and wheeze, can take Cepacol lozenges for sore throat.  Will up as needed, return precautions given, discussed alarm signs with patient.  Refilled albuterol, patient takes this for asthma.  Use as needed for wheezing.   No orders of the defined types were placed in this encounter.   Meds ordered this encounter  Medications  . albuterol (PROVENTIL HFA;VENTOLIN HFA) 108 (90 Base) MCG/ACT inhaler    Sig: Inhale 2 puffs  into the lungs every 6 (six) hours as needed for wheezing or shortness of breath.    Dispense:  2 Inhaler    Refill:  0     Myrene Buddy MD PGY-2 Family Medicine Resident  08/28/2018 10:54 AM

## 2018-09-02 ENCOUNTER — Other Ambulatory Visit: Payer: Self-pay

## 2018-09-02 ENCOUNTER — Ambulatory Visit (INDEPENDENT_AMBULATORY_CARE_PROVIDER_SITE_OTHER): Payer: Medicare Other | Admitting: Family Medicine

## 2018-09-02 ENCOUNTER — Encounter: Payer: Self-pay | Admitting: Family Medicine

## 2018-09-02 VITALS — BP 114/72 | HR 77 | Temp 98.3°F | Ht 72.0 in | Wt 299.4 lb

## 2018-09-02 DIAGNOSIS — J209 Acute bronchitis, unspecified: Secondary | ICD-10-CM | POA: Diagnosis not present

## 2018-09-02 MED ORDER — PREDNISONE 10 MG PO TABS: ORAL_TABLET | ORAL | 0 refills | Status: DC

## 2018-09-02 MED ORDER — BENZONATATE 200 MG PO CAPS: 200.0000 mg | ORAL_CAPSULE | Freq: Three times a day (TID) | ORAL | 0 refills | Status: DC | PRN

## 2018-09-02 NOTE — Patient Instructions (Signed)
Take the prednisone as follow:  Take 6 pills today, 5 pills tomorrow, 4 pills today after, 3 pills after, 2 pills after, 1 pill last day  Take the Tessalon perles as needed.   Use the albuterol inhaler as needed.    Come back and see me next week if you're still having trouble with your cough.  However, this should get things cleared up.

## 2018-09-02 NOTE — Progress Notes (Signed)
Subjective:    Brianna Wheeler is a 63 y.o. female who presents to [facility] today for cough:  1.  Cough: Ongoing.  Seen here previously in clinic.  States symptoms have been going on for a couple of weeks.  Cough is keeping her up at night.  Mostly dry and hacking.  No fevers or chills.  She initially had some URI symptoms but now it is just the cough that is causing her issues.  No sore throat.  Eating and drinking well.  No nausea vomiting.  She has an inhaler at home which she has not used.   ROS as above.  The following portions of the patient's history were reviewed and updated as appropriate: allergies, current medications, past medical history, family and social history, and problem list. Patient is a nonsmoker.    PMH reviewed.  Past Medical History:  Diagnosis Date  . Diabetes mellitus   . Hyperlipidemia   . Hypertension   . Left foot pain 2005   Chronic injury -- multiple broken bones and torn ligaments after accident in garage   Past Surgical History:  Procedure Laterality Date  . EYE SURGERY    . LUMBAR DISC SURGERY    . TUBAL LIGATION      Medications reviewed. Current Outpatient Medications  Medication Sig Dispense Refill  . albuterol (PROVENTIL HFA;VENTOLIN HFA) 108 (90 Base) MCG/ACT inhaler INHALE TWO PUFFS BY MOUTH EVERY 4 HOURS AS NEEDED 9 g 0  . albuterol (PROVENTIL HFA;VENTOLIN HFA) 108 (90 Base) MCG/ACT inhaler Inhale 2 puffs into the lungs every 6 (six) hours as needed for wheezing or shortness of breath. 2 Inhaler 0  . aspirin (ASPIR-81) 81 MG EC tablet Take 81 mg by mouth daily.      . Calcium Carb-Cholecalciferol (CALCIUM 1000 + D PO) Take by mouth.    . cyclobenzaprine (FLEXERIL) 10 MG tablet Take 1 tablet (10 mg total) by mouth 2 (two) times daily as needed for muscle spasms. 45 tablet 1  . Elastic Bandages & Supports (LUMBAR BACK BRACE/SUPPORT PAD) MISC Use as needed for support of your back and pain relief. (Patient not taking: Reported on 01/02/2017) 1  each 0  . EPINEPHrine (EPI-PEN) 0.3 mg/0.3 mL DEVI Inject 0.3 mLs (0.3 mg total) into the muscle once. 2 Device 0  . fexofenadine (ALLEGRA) 180 MG tablet Take 180 mg by mouth daily.      . fluticasone (FLONASE) 50 MCG/ACT nasal spray Place 2 sprays into both nostrils daily. Place 2 sprays in each nostril daily 32 g 12  . gabapentin (NEURONTIN) 300 MG capsule Take 1 capsule (300 mg total) by mouth at bedtime. 30 capsule 1  . hydrochlorothiazide (HYDRODIURIL) 25 MG tablet TAKE 1 TABLET BY MOUTH ONCE DAILY 90 tablet 0  . levothyroxine (SYNTHROID, LEVOTHROID) 112 MCG tablet Take 1 tablet (112 mcg total) by mouth daily. 90 tablet 2  . Multiple Vitamin (MULTIVITAMIN) tablet Take 1 tablet by mouth daily.    Marland Kitchen omeprazole (PRILOSEC) 20 MG capsule TAKE 1 CAPSULE BY MOUTH ONCE DAILY 90 capsule 0  . simvastatin (ZOCOR) 40 MG tablet TAKE ONE TABLET BY MOUTH AT BEDTIME 90 tablet 2  . traMADol (ULTRAM) 50 MG tablet TAKE 1 TO 2 TABLETS BY MOUTH EVERY 12 HOURS AS NEEDED FOR PAIN 120 tablet 1   No current facility-administered medications for this visit.      Objective:   Physical Exam BP 114/72   Pulse 77   Temp 98.3 F (36.8 C) (Oral)  Ht 6' (1.829 m)   Wt 299 lb 6.4 oz (135.8 kg)   SpO2 97%   BMI 40.61 kg/m  Gen:  Patient sitting on exam table, appears stated age in no acute distress although she does look like she feels ill. Head: Normocephalic atraumatic Eyes: EOMI, PERRL, sclera and conjunctiva non-erythematous Ears:  Canals clear bilaterally.  TMs pearly gray bilaterally without erythema or bulging.   Nose:  Nasal turbinates without exudates. Mouth: Mucosa membranes moist. Tonsils +2, nonenlarged, minimal erythema. Neck: No cervical lymphadenopathy noted Heart:  RRR, no murmurs auscultated. Pulm: Some rhonchi bilateral bases.  Persistent coughing throughout lung exam.  Impression/plan:  1.  Bronchitis: -Ongoing reactive bronchitis to recent URI infection. -See AVS for details.   Prednisone plus Tessalon Perles as needed.  Also she can start taking her inhaler to help with asthma symptoms. -Follow-up next week if no improvement.  Sooner if worsening.

## 2018-09-03 NOTE — Progress Notes (Signed)
Patient here with her son.  Requested Toradol shot for ongoing, chronic back pain.  To give today.  See prior/other OV notes for full details on back pain.

## 2018-09-04 ENCOUNTER — Encounter: Payer: Self-pay | Admitting: Family Medicine

## 2018-09-09 ENCOUNTER — Ambulatory Visit (INDEPENDENT_AMBULATORY_CARE_PROVIDER_SITE_OTHER): Payer: Medicare Other | Admitting: Family Medicine

## 2018-09-09 ENCOUNTER — Encounter: Payer: Self-pay | Admitting: Family Medicine

## 2018-09-09 ENCOUNTER — Telehealth: Payer: Self-pay | Admitting: Family Medicine

## 2018-09-09 ENCOUNTER — Other Ambulatory Visit: Payer: Self-pay

## 2018-09-09 VITALS — BP 116/72 | HR 80 | Temp 98.2°F | Ht 72.0 in | Wt 298.8 lb

## 2018-09-09 DIAGNOSIS — J209 Acute bronchitis, unspecified: Secondary | ICD-10-CM | POA: Diagnosis not present

## 2018-09-09 DIAGNOSIS — M5441 Lumbago with sciatica, right side: Secondary | ICD-10-CM

## 2018-09-09 DIAGNOSIS — R069 Unspecified abnormalities of breathing: Secondary | ICD-10-CM

## 2018-09-09 MED ORDER — ALBUTEROL SULFATE (2.5 MG/3ML) 0.083% IN NEBU
2.5000 mg | INHALATION_SOLUTION | Freq: Once | RESPIRATORY_TRACT | Status: AC
  Administered 2018-09-09: 2.5 mg via RESPIRATORY_TRACT

## 2018-09-09 MED ORDER — IPRATROPIUM BROMIDE 0.02 % IN SOLN
0.5000 mg | Freq: Once | RESPIRATORY_TRACT | Status: AC
  Administered 2018-09-09: 0.5 mg via RESPIRATORY_TRACT

## 2018-09-09 MED ORDER — PREDNISONE 50 MG PO TABS: ORAL_TABLET | ORAL | 0 refills | Status: DC

## 2018-09-09 MED ORDER — HYDROCODONE-HOMATROPINE 5-1.5 MG/5ML PO SYRP: 5.0000 mL | ORAL_SOLUTION | Freq: Three times a day (TID) | ORAL | 0 refills | Status: DC | PRN

## 2018-09-09 NOTE — Progress Notes (Signed)
Subjective:    Brianna Wheeler is a 63 y.o. female who presents to [facility] today for persistent cough:  1.  Cough:  Persists.  Started with "bad batch of wood" they burned in fireplace.  States neighbors have similar symptoms, all using the same wood.  Describes dry hacking cough.  She was seen here about 2 weeks ago for the same.  Returned last week due to lack of improvement.  Started on Prednisone and Tessalon perles.    Since then: not much relief.  Cough worse at night and when out in cold air or if she drinks cold liquids.  She is using her Albuterol inhaler sparingly.    No actual chest pain.  No fevers or chills.  No night sweats.  No weight loss.  No hemoptysis.  No N/V.  Feels tired from coughing so much.  Trouble sleeping at night due to cough.     ROS as above per HPI.    The following portions of the patient's history were reviewed and updated as appropriate: allergies, current medications, past medical history, family and social history, and problem list. Patient is a nonsmoker.    PMH reviewed.  Past Medical History:  Diagnosis Date  . Diabetes mellitus   . Hyperlipidemia   . Hypertension   . Left foot pain 2005   Chronic injury -- multiple broken bones and torn ligaments after accident in garage   Past Surgical History:  Procedure Laterality Date  . EYE SURGERY    . LUMBAR DISC SURGERY    . TUBAL LIGATION      Medications reviewed. Current Outpatient Medications  Medication Sig Dispense Refill  . albuterol (PROVENTIL HFA;VENTOLIN HFA) 108 (90 Base) MCG/ACT inhaler INHALE TWO PUFFS BY MOUTH EVERY 4 HOURS AS NEEDED 9 g 0  . albuterol (PROVENTIL HFA;VENTOLIN HFA) 108 (90 Base) MCG/ACT inhaler Inhale 2 puffs into the lungs every 6 (six) hours as needed for wheezing or shortness of breath. 2 Inhaler 0  . aspirin (ASPIR-81) 81 MG EC tablet Take 81 mg by mouth daily.      . benzonatate (TESSALON) 200 MG capsule Take 1 capsule (200 mg total) by mouth 3 (three) times daily as  needed for cough. 21 capsule 0  . Calcium Carb-Cholecalciferol (CALCIUM 1000 + D PO) Take by mouth.    . cyclobenzaprine (FLEXERIL) 10 MG tablet Take 1 tablet (10 mg total) by mouth 2 (two) times daily as needed for muscle spasms. 45 tablet 1  . Elastic Bandages & Supports (LUMBAR BACK BRACE/SUPPORT PAD) MISC Use as needed for support of your back and pain relief. (Patient not taking: Reported on 01/02/2017) 1 each 0  . EPINEPHrine (EPI-PEN) 0.3 mg/0.3 mL DEVI Inject 0.3 mLs (0.3 mg total) into the muscle once. 2 Device 0  . fexofenadine (ALLEGRA) 180 MG tablet Take 180 mg by mouth daily.      . fluticasone (FLONASE) 50 MCG/ACT nasal spray Place 2 sprays into both nostrils daily. Place 2 sprays in each nostril daily 32 g 12  . gabapentin (NEURONTIN) 300 MG capsule Take 1 capsule (300 mg total) by mouth at bedtime. 30 capsule 1  . hydrochlorothiazide (HYDRODIURIL) 25 MG tablet TAKE 1 TABLET BY MOUTH ONCE DAILY 90 tablet 0  . levothyroxine (SYNTHROID, LEVOTHROID) 112 MCG tablet Take 1 tablet (112 mcg total) by mouth daily. 90 tablet 2  . Multiple Vitamin (MULTIVITAMIN) tablet Take 1 tablet by mouth daily.    Marland Kitchen omeprazole (PRILOSEC) 20 MG capsule TAKE 1  CAPSULE BY MOUTH ONCE DAILY 90 capsule 0  . predniSONE (DELTASONE) 10 MG tablet Take 6 pills today, 5 pills tomorrow, 4 pills today after, 3 pills after, 2 pills after, 1 pill last day 21 tablet 0  . simvastatin (ZOCOR) 40 MG tablet TAKE ONE TABLET BY MOUTH AT BEDTIME 90 tablet 2  . traMADol (ULTRAM) 50 MG tablet TAKE 1 TO 2 TABLETS BY MOUTH EVERY 12 HOURS AS NEEDED FOR PAIN 120 tablet 1   No current facility-administered medications for this visit.      Objective:   Physical Exam BP 116/72   Pulse 80   Temp 98.2 F (36.8 C) (Oral)   Ht 6' (1.829 m)   Wt 298 lb 12.8 oz (135.5 kg)   SpO2 96%   BMI 40.52 kg/m  Gen:  Alert, cooperative patient who appears stated age in no acute distress.  Vital signs reviewed. HEENT: EOMI.  Nares without any  drainage. MMM Cardiac:  Regular rate and rhythm  Pulm:  Rhonchi BL bases.  Coughing during lung exam.   Ext:  No LE edema.   Exam s/p neb tx:  Lungs sound clearer.  More coughing, some productive.  With some trembling of hands s/p albuterol usage.

## 2018-09-09 NOTE — Assessment & Plan Note (Addendum)
No red flags.  Pulse ox here is good.   To increase her albuterol usage at home.  Refill for prednisone Hycodan to help with cough at night.  Continue flonase and allegra as the weather has been so tempermental and is likely not helping the situation. Return in 1 week to assess for improvement.  Sonoer if worsening.

## 2018-09-09 NOTE — Patient Instructions (Addendum)
It was good to see you again.  I'm sorry the coughing is still going on.  I believe this has become bronchitis.   Take the Prednisone 1 pill a day for the next 5 days.  Use your albuterol inhaler every 4 hours scheduled through the week.  Starting this weekend you can switch to as needed.    Come back and see me next week so I know you're starting to get better.

## 2018-09-09 NOTE — Telephone Encounter (Signed)
**  After Hours/ Emergency Line Call**  Received a call to report that [name] concerned of new RX received from PCP today. Patient seen in clinic and diagnosed with acute bronchitis. Was given RX for hycodan cough syrup and was concerned of packaging. States when her son picked up RX from pharmacy it came with a medicine dropper rather than a cup so she was not sure if this was the correct medication. Patient was very worried about taking this medication as she is concerned it will be sedating.   Endorsing cough but no SOB.  Denying CP.  Recommended that since I am unable to see the bottle (I can only see the RX that was sent by PCP) that if patient is very concerned she could hold off on taking medications today to talk to her PCP in more detail. RX is written as PRN rather than scheduled.  Patient also expressed concern that she would rather wait to take medications to talk to her PCP first. Explained to her that PCP is not available and to call clinic after opening at 8:30AM. Red flags discussed.  Will forward to PCP.  Oralia Manis, DO PGY-2, [facility] 09/09/2018 10:50 PM

## 2018-09-10 NOTE — Telephone Encounter (Signed)
Pt calls to let Dr. Gwendolyn Grant know that she gets very nauseous, hot, sweaty and feels "out of it" when taking this med.    I noticed that hydrocodone is on her allregy list.   Advised to not take anymore until she hears back from [place].    Will forward to MD. Fleeger, [name], CMA

## 2018-09-12 NOTE — Telephone Encounter (Signed)
Tried to call patient to check on her but had to leave message.   Plan will be to continue with Tessalon perles and prednisone and inhaler.  Most of the other cough syrups are on back order or have codeine to which she is probably allergic

## 2018-09-15 MED FILL — traMADol HCL 50 MG TABS: 50 | 30 days supply | Qty: 120 | Fill #1

## 2018-09-17 ENCOUNTER — Other Ambulatory Visit: Payer: Self-pay

## 2018-09-17 ENCOUNTER — Encounter: Payer: Self-pay | Admitting: Family Medicine

## 2018-09-17 ENCOUNTER — Ambulatory Visit (INDEPENDENT_AMBULATORY_CARE_PROVIDER_SITE_OTHER): Payer: Medicare Other | Admitting: Family Medicine

## 2018-09-17 VITALS — BP 128/64 | HR 79 | Temp 98.1°F | Ht 72.0 in | Wt 299.0 lb

## 2018-09-17 DIAGNOSIS — Z0000 Encounter for general adult medical examination without abnormal findings: Secondary | ICD-10-CM | POA: Diagnosis not present

## 2018-09-17 DIAGNOSIS — J069 Acute upper respiratory infection, unspecified: Secondary | ICD-10-CM

## 2018-09-17 DIAGNOSIS — M545 Low back pain, unspecified: Secondary | ICD-10-CM

## 2018-09-17 DIAGNOSIS — E039 Hypothyroidism, unspecified: Secondary | ICD-10-CM

## 2018-09-17 DIAGNOSIS — B9789 Other viral agents as the cause of diseases classified elsewhere: Secondary | ICD-10-CM

## 2018-09-17 MED ORDER — KETOROLAC TROMETHAMINE 30 MG/ML IJ SOLN
30.0000 mg | Freq: Once | INTRAMUSCULAR | Status: AC
  Administered 2018-09-17: 30 mg via INTRAMUSCULAR

## 2018-09-17 NOTE — Assessment & Plan Note (Signed)
Follow up for Pap smear

## 2018-09-17 NOTE — Assessment & Plan Note (Signed)
Pretty much at goal except for hitting her back recently.  Toradol here.  Continue Tramadol at home.

## 2018-09-17 NOTE — Patient Instructions (Signed)
It was good to see you again today.   I'm glad the cough has stopped!  We're checking some labs today.  Come back in a month or so for your pap smear.  Toradol shot today for your back.

## 2018-09-17 NOTE — Assessment & Plan Note (Signed)
MUCH improved. Resolved FU if symptoms recur.  Hydrocodone now on allergy list.

## 2018-09-17 NOTE — Progress Notes (Signed)
Subjective:    Brianna Wheeler is a 63 y.o. female who presents to [facility] today for FU for cough:  1.  Cough:  Much better.  Completely resolved.  Last coughing episode was day before yesterday.  No longer taking prednisone or Tessalon perles.  Albuterol only as needed.  No further URI symptoms.  NO fevers or chills.  She did have some difficulty catching her breath and feeling nauseous after using the Norco syrup.  She hasn't had any issues since stopping it.    2.  Back pain:  Overall, doing better from this standpoint.  She did hit her back on a doorknob a few days ago and has spot tenderness there for that.  No bruising.  No falls.  No bladder/bowel incontinence.     ROS as above per HPI.    The following portions of the patient's history were reviewed and updated as appropriate: allergies, current medications, past medical history, family and social history, and problem list. Patient is a nonsmoker.    PMH reviewed.  Past Medical History:  Diagnosis Date  . Diabetes mellitus   . Hyperlipidemia   . Hypertension   . Left foot pain 2005   Chronic injury -- multiple broken bones and torn ligaments after accident in garage   Past Surgical History:  Procedure Laterality Date  . EYE SURGERY    . LUMBAR DISC SURGERY    . TUBAL LIGATION      Medications reviewed. Current Outpatient Medications  Medication Sig Dispense Refill  . albuterol (PROVENTIL HFA;VENTOLIN HFA) 108 (90 Base) MCG/ACT inhaler Inhale 2 puffs into the lungs every 6 (six) hours as needed for wheezing or shortness of breath. 2 Inhaler 0  . aspirin (ASPIR-81) 81 MG EC tablet Take 81 mg by mouth daily.      . benzonatate (TESSALON) 200 MG capsule Take 1 capsule (200 mg total) by mouth 3 (three) times daily as needed for cough. 21 capsule 0  . Calcium Carb-Cholecalciferol (CALCIUM 1000 + D PO) Take by mouth.    . cyclobenzaprine (FLEXERIL) 10 MG tablet Take 1 tablet (10 mg total) by mouth 2 (two) times daily as needed for  muscle spasms. 45 tablet 1  . Elastic Bandages & Supports (LUMBAR BACK BRACE/SUPPORT PAD) MISC Use as needed for support of your back and pain relief. (Patient not taking: Reported on 01/02/2017) 1 each 0  . EPINEPHrine (EPI-PEN) 0.3 mg/0.3 mL DEVI Inject 0.3 mLs (0.3 mg total) into the muscle once. 2 Device 0  . fexofenadine (ALLEGRA) 180 MG tablet Take 180 mg by mouth daily.      . fluticasone (FLONASE) 50 MCG/ACT nasal spray Place 2 sprays into both nostrils daily. Place 2 sprays in each nostril daily 32 g 12  . gabapentin (NEURONTIN) 300 MG capsule Take 1 capsule (300 mg total) by mouth at bedtime. 30 capsule 1  . hydrochlorothiazide (HYDRODIURIL) 25 MG tablet TAKE 1 TABLET BY MOUTH ONCE DAILY 90 tablet 0  . HYDROcodone-homatropine (HYCODAN) 5-1.5 MG/5ML syrup Take 5 mLs by mouth every 8 (eight) hours as needed for cough. 120 mL 0  . levothyroxine (SYNTHROID, LEVOTHROID) 112 MCG tablet Take 1 tablet (112 mcg total) by mouth daily. 90 tablet 2  . Multiple Vitamin (MULTIVITAMIN) tablet Take 1 tablet by mouth daily.    Marland Kitchen omeprazole (PRILOSEC) 20 MG capsule TAKE 1 CAPSULE BY MOUTH ONCE DAILY 90 capsule 0  . predniSONE (DELTASONE) 10 MG tablet Take 6 pills today, 5 pills tomorrow, 4 pills  today after, 3 pills after, 2 pills after, 1 pill last day 21 tablet 0  . predniSONE (DELTASONE) 50 MG tablet Take 1 tab po daily x 5 days 5 tablet 0  . simvastatin (ZOCOR) 40 MG tablet TAKE ONE TABLET BY MOUTH AT BEDTIME 90 tablet 2  . traMADol (ULTRAM) 50 MG tablet TAKE 1 TO 2 TABLETS BY MOUTH EVERY 12 HOURS AS NEEDED FOR PAIN 120 tablet 1   No current facility-administered medications for this visit.      Objective:   Physical Exam BP 128/64   Pulse 79   Temp 98.1 F (36.7 C) (Oral)   Ht 6' (1.829 m)   Wt 299 lb (135.6 kg)   SpO2 97%   BMI 40.55 kg/m  Gen:  Alert, cooperative patient who appears stated age in no acute distress.  Vital signs reviewed. HEENT: EOMI,  MMM Cardiac:  Regular rate and  rhythm without murmur auscultated.  Good S1/S2. Pulm:  Clear to auscultation bilaterally with good air movement.  No wheezes or rales noted.   Back:  Spot tenderness without bruising Right lower flank.  Ambulating well without assistance.   No results found for this or any previous visit (from the past 72 hour(s)).

## 2018-09-18 LAB — LIPID PANEL
Chol/HDL Ratio: 4 ratio (ref 0.0–4.4)
Cholesterol, Total: 165 mg/dL (ref 100–199)
HDL: 41 mg/dL (ref >39)
LDL Calculated: 103 mg/dL — ABNORMAL HIGH (ref 0–99)
Triglycerides: 107 mg/dL (ref 0–149)
VLDL Cholesterol Cal: 21 mg/dL (ref 5–40)

## 2018-09-18 LAB — COMPREHENSIVE METABOLIC PANEL
ALT: 20 IU/L (ref 0–32)
AST: 31 IU/L (ref 0–40)
Albumin/Globulin Ratio: 0.8 — ABNORMAL LOW (ref 1.2–2.2)
Albumin: 3.6 g/dL — ABNORMAL LOW (ref 3.8–4.8)
Alkaline Phosphatase: 56 IU/L (ref 39–117)
BUN/Creatinine Ratio: 11 — ABNORMAL LOW (ref 12–28)
BUN: 10 mg/dL (ref 8–27)
Bilirubin Total: 0.9 mg/dL (ref 0.0–1.2)
CO2: 24 mmol/L (ref 20–29)
Calcium: 9.3 mg/dL (ref 8.7–10.3)
Chloride: 98 mmol/L (ref 96–106)
Creatinine, Ser: 0.88 mg/dL (ref 0.57–1.00)
GFR calc Af Amer: 80 mL/min/{1.73_m2} (ref >59)
GFR calc non Af Amer: 70 mL/min/{1.73_m2} (ref >59)
Globulin, Total: 4.3 g/dL (ref 1.5–4.5)
Glucose: 120 mg/dL — ABNORMAL HIGH (ref 65–99)
Potassium: 3.6 mmol/L (ref 3.5–5.2)
Sodium: 136 mmol/L (ref 134–144)
Total Protein: 7.9 g/dL (ref 6.0–8.5)

## 2018-09-18 LAB — CBC
Hematocrit: 37.9 % (ref 34.0–46.6)
Hemoglobin: 12.6 g/dL (ref 11.1–15.9)
MCH: 26.9 pg (ref 26.6–33.0)
MCHC: 33.2 g/dL (ref 31.5–35.7)
MCV: 81 fL (ref 79–97)
Platelets: 173 10*3/uL (ref 150–450)
RBC: 4.68 x10E6/uL (ref 3.77–5.28)
RDW: 16 % — ABNORMAL HIGH (ref 11.7–15.4)
WBC: 4.6 10*3/uL (ref 3.4–10.8)

## 2018-09-18 LAB — TSH: TSH: 0.321 u[IU]/mL — AB (ref 0.450–4.500)

## 2018-09-22 ENCOUNTER — Telehealth: Payer: Self-pay | Admitting: *Deleted

## 2018-09-22 ENCOUNTER — Encounter: Payer: Self-pay | Admitting: Family Medicine

## 2018-09-22 NOTE — Telephone Encounter (Signed)
Pt calls requesting results of lab work.  She wants to know if she needs to continue the simvastatin. Fleeger, [name], CMA

## 2018-09-23 NOTE — Telephone Encounter (Signed)
Informed pt of below and she stated that she needs a refill on her statin, she said pharmacy said she needed a new Rx. Lamonte Sakai, April D, [place]

## 2018-09-24 ENCOUNTER — Other Ambulatory Visit: Payer: Self-pay | Admitting: *Deleted

## 2018-09-25 ENCOUNTER — Other Ambulatory Visit: Payer: Self-pay | Admitting: Family Medicine

## 2018-09-25 MED ORDER — PRAVASTATIN SODIUM 20 MG PO TABS: 20.0000 mg | ORAL_TABLET | Freq: Every day | ORAL | 3 refills | Status: DC

## 2018-09-25 NOTE — Telephone Encounter (Signed)
Switching her to pravastatin as this has less chance of a medication interaction.

## 2018-09-29 ENCOUNTER — Telehealth: Payer: Self-pay

## 2018-09-29 NOTE — Telephone Encounter (Signed)
Patient has received lab letter. States she has been feeling tired with dry skin and losing hair. With her TSH being abnormal wants to know if her thyroid medication should be adjusted.  Call back is [phone]  [name], RN [facility] RN)

## 2018-10-01 ENCOUNTER — Encounter: Payer: Self-pay | Admitting: Family Medicine

## 2018-10-01 ENCOUNTER — Ambulatory Visit (INDEPENDENT_AMBULATORY_CARE_PROVIDER_SITE_OTHER): Payer: 59 | Admitting: Family Medicine

## 2018-10-01 ENCOUNTER — Other Ambulatory Visit: Payer: Self-pay

## 2018-10-01 VITALS — BP 126/78 | HR 77 | Temp 98.2°F | Ht 72.0 in | Wt 302.4 lb

## 2018-10-01 DIAGNOSIS — E039 Hypothyroidism, unspecified: Secondary | ICD-10-CM | POA: Diagnosis not present

## 2018-10-01 DIAGNOSIS — E119 Type 2 diabetes mellitus without complications: Secondary | ICD-10-CM

## 2018-10-01 LAB — GLUCOSE, POCT (MANUAL RESULT ENTRY): POC Glucose: 168 mg/dl — AB (ref 70–99)

## 2018-10-01 LAB — POCT GLYCOSYLATED HEMOGLOBIN (HGB A1C): HbA1c, POC (controlled diabetic range): 6.4 % (ref 0.0–7.0)

## 2018-10-01 MED ORDER — HYDROCHLOROTHIAZIDE 25 MG PO TABS: 25.0000 mg | ORAL_TABLET | Freq: Every day | ORAL | 1 refills | Status: DC

## 2018-10-01 MED ORDER — LEVOTHYROXINE SODIUM 75 MCG PO TABS: 75.0000 ug | ORAL_TABLET | Freq: Every day | ORAL | 1 refills | Status: DC

## 2018-10-01 MED ORDER — OMEPRAZOLE 20 MG PO CPDR: 20.0000 mg | DELAYED_RELEASE_CAPSULE | Freq: Every day | ORAL | 1 refills | Status: DC

## 2018-10-01 NOTE — Progress Notes (Signed)
Subjective:    Brianna Wheeler is a 63 y.o. female who presents to [facility] today for "feeling drunk":  1.  Not feeling well:   Has had some general malaise (mild) and also mild vertiginous symptoms.  Hair has become more brittle.  Dry skin has returned.  States she feels same as when she was first diagnosed with hypothyroidism.  No chest pain.  No falls.  NO palpitations/LE edema/SOB.  Eating and drinking well.  No paresthesias.      ROS as above per HPI.    The following portions of the patient's history were reviewed and updated as appropriate: allergies, current medications, past medical history, family and social history, and problem list. Patient is a nonsmoker.    PMH reviewed.  Past Medical History:  Diagnosis Date  . Diabetes mellitus   . Hyperlipidemia   . Hypertension   . Left foot pain 2005   Chronic injury -- multiple broken bones and torn ligaments after accident in garage   Past Surgical History:  Procedure Laterality Date  . EYE SURGERY    . LUMBAR DISC SURGERY    . TUBAL LIGATION      Medications reviewed.    Objective:   Physical Exam BP 126/78   Pulse 77   Temp 98.2 F (36.8 C) (Oral)   Ht 6' (1.829 m)   Wt (!) 302 lb 6.4 oz (137.2 kg)   SpO2 (!) 76%   BMI 41.01 kg/m  Gen:  Alert, cooperative patient who appears stated age in no acute distress.  Vital signs reviewed. HEENT: EOMI,  MMM Cardiac:  Regular rate and rhythm  Pulm:  Clear to auscultation bilaterally with good air movement.  No wheezes or rales noted.   Abd:  Soft/nondistended/nontender.   Exts: Non edematous BL  LE, warm and well perfused.  Neuro:  Alert and oriented to person, place, and date.  No focal deficits on exam.  Walking well without abnormal gait.  Balance good.

## 2018-10-01 NOTE — Patient Instructions (Signed)
Your thyroid test was low, which means that you're dose of medicine is actually too high.  I have decreased this back to 75 mcg.    Start this on Friday.  You should feel better this weekend.  Come back in about 4 weeks and we'll recheck your thyroid then.

## 2018-10-01 NOTE — Telephone Encounter (Signed)
She's coming in to see me this PM.  We'll discuss at that visit.

## 2018-10-02 ENCOUNTER — Other Ambulatory Visit: Payer: Self-pay | Admitting: *Deleted

## 2018-10-02 DIAGNOSIS — M5441 Lumbago with sciatica, right side: Secondary | ICD-10-CM

## 2018-10-03 ENCOUNTER — Encounter: Payer: Self-pay | Admitting: Family Medicine

## 2018-10-03 MED ORDER — TRAMADOL HCL 50 MG PO TABS: ORAL_TABLET | ORAL | 1 refills | Status: DC

## 2018-10-03 NOTE — Assessment & Plan Note (Signed)
TSH low last visit.  Likely iatrogenic -- too high dose of synthroid.   Plan to cut dose to 75 mcg and FU in next 4 weeks for TSH recheck.   No red flags.  Overall, her symptoms are mild If they do not improve with reduction of her synthroid, will need to think about other causes of vertigo-like symptoms.  Neuro exam looks great today.

## 2018-10-13 ENCOUNTER — Telehealth: Payer: Self-pay

## 2018-10-13 NOTE — Telephone Encounter (Signed)
Pt calling to ask Dr. Gwendolyn Grant if there is anything else she can get for her back pain on the left side. Pt states that when it rains, her left side gets cold and she can hardly move. She uses a heating pad at home and that helps some but she can't drive with a heating pad. Please call her at .[phone]. Brianna Wheeler, CMA

## 2018-10-14 MED ORDER — DICLOFENAC SODIUM 1 % TD GEL: 2.0000 g | Freq: Four times a day (QID) | TRANSDERMAL | 1 refills | Status: DC

## 2018-10-14 NOTE — Telephone Encounter (Signed)
Tried to call back. No answer. 

## 2018-10-14 NOTE — Telephone Encounter (Addendum)
Called and spoke to Brianna Wheeler.  She is still having some dull aching in her lower back with prolonged sitting or when raining.  Will attempt Voltaren gel.  Continue Tramadol and heating pad as she has been.  Call back later this week to assess for improvement.    No red flags.  No bladder/bowel incontinence.    She has an intolerance of GI upset to NSAIDs when taken orally.  Much less likely with voltaren.  Will attempt and see how she tolerates.  She does fine with Toradol injections here in clinic.

## 2018-10-14 NOTE — Addendum Note (Signed)
Addended by: Tobey Grim on: 10/14/2018 03:49 PM   Modules accepted: Orders

## 2018-11-05 ENCOUNTER — Ambulatory Visit: Payer: Medicare Other | Admitting: Family Medicine

## 2018-11-06 ENCOUNTER — Other Ambulatory Visit: Payer: Self-pay | Admitting: Family Medicine

## 2018-11-06 ENCOUNTER — Other Ambulatory Visit: Payer: Self-pay

## 2018-11-06 ENCOUNTER — Telehealth (INDEPENDENT_AMBULATORY_CARE_PROVIDER_SITE_OTHER): Admitting: Family Medicine

## 2018-11-06 DIAGNOSIS — E039 Hypothyroidism, unspecified: Secondary | ICD-10-CM

## 2018-11-06 NOTE — Progress Notes (Signed)
[facility] Telemedicine Visit Unable to perform video virtual visit Patient consented to have visit conducted via telephone.  Encounter participants: Patient: Brianna Wheeler  Patient Location: Home Provider: Tawanna Cooler Tarl Wheeler  Others (if applicable):   Chief Complaint:  Hypothyroidism  HPI:  HYpothyroidism Hypothyroidism Longstanding issue for patient, at least since 2008 Patient needs evaluation of thyroid function.  Prior symtpoms of shaking, vertigo/dry skin have resolved since reduction in LT4 dose from 112 mcg  Daily to 75 mcg daily by Dr Brianna Wheeler on 10/01/18 ov. The hypothyroidism is likely due to hypothyroidism and autoimmune thyroiditis.   ROS: No fever No cough  Pertinent PMHx: Hypothyroidism  Exam:  Respiratory: speaking in full sentence, no audible wheeze, voice prosodic   Assessment/Plan:  Hypothyroidism Established problem that has improved.  Brianna Wheeler feels better since Dr Brianna Wheeler reduced dose LT4 from 112 to 75 mcg daily. Request pt come into [facility] lab for TSH draw in next couple of days.  We agreed to hold off filling her a 90 day supply of the 75 mcg tablets until we saw the results of her lab test.  Continue 75 mcg dailyfor now    Time spent on phone with patient: 8 minutes

## 2018-11-06 NOTE — Progress Notes (Unsigned)
tsh order

## 2018-11-06 NOTE — Assessment & Plan Note (Signed)
Established problem that has improved.  Brianna Wheeler feels better since Dr Gwendolyn Grant reduced dose LT4 from 112 to 75 mcg daily. Request pt come into [facility] lab for TSH draw in next couple of days.  We agreed to hold off filling her a 90 day supply of the 75 mcg tablets until we saw the results of her lab test.  Continue 75 mcg dailyfor now

## 2018-11-07 ENCOUNTER — Other Ambulatory Visit: Payer: Medicare Other

## 2018-11-11 ENCOUNTER — Other Ambulatory Visit: Payer: Self-pay

## 2018-11-11 ENCOUNTER — Other Ambulatory Visit: Payer: Medicare Other

## 2018-11-11 DIAGNOSIS — E039 Hypothyroidism, unspecified: Secondary | ICD-10-CM | POA: Diagnosis not present

## 2018-11-12 LAB — TSH: TSH: 7.73 u[IU]/mL — ABNORMAL HIGH (ref 0.450–4.500)

## 2018-11-13 ENCOUNTER — Telehealth: Payer: Self-pay

## 2018-11-13 ENCOUNTER — Other Ambulatory Visit: Payer: Self-pay | Admitting: Family Medicine

## 2018-11-13 MED ORDER — LEVOTHYROXINE SODIUM 88 MCG PO TABS: 88.0000 ug | ORAL_TABLET | Freq: Every day | ORAL | 0 refills | Status: DC

## 2018-11-13 NOTE — Progress Notes (Signed)
Called and spoke to Brianna Wheeler.  She is overall doing well.  Having a little bit of leg pain because she "overdid it doing yard work yesterday."  Leg pain is not unreasonable.  She has felt a little better since the decrease in her thyroid medication.  Her TSH is a little elevated now.  We are going to split the difference she was previously taking 112 mcg we just dropped her down to 75 mcg.  I am going to send an 88 mcg for now and recheck her in about 6 weeks with another TSH blood draw.  Patient agreed with plan.

## 2018-11-13 NOTE — Telephone Encounter (Signed)
Pt called nurse line requesting recent lab results and to see if her medication needs to be changed. Please advise.

## 2018-11-18 ENCOUNTER — Telehealth: Payer: Self-pay | Admitting: Family Medicine

## 2018-11-18 NOTE — Telephone Encounter (Signed)
Left mssgs on home and mobile phones that I was calling with thyroid test results and to discuss dosing of her levothyroxine.   Will try to reach her later.

## 2018-12-02 ENCOUNTER — Telehealth: Payer: Self-pay

## 2018-12-02 NOTE — Telephone Encounter (Signed)
Patient calls nurse line stating she would like to speak with Gwendolyn Grant. Pt is very upset dealing with a personal matter. Will forward to PCP.

## 2018-12-03 NOTE — Telephone Encounter (Signed)
I discussed Brianna Wheeler' slightly elevated TSH results with Dr Gwendolyn Grant. He said he would discuss result with Brianna Durst.

## 2018-12-03 NOTE — Telephone Encounter (Signed)
Attempted to call back. Had to leave message.  

## 2018-12-09 ENCOUNTER — Other Ambulatory Visit: Payer: Self-pay

## 2018-12-09 ENCOUNTER — Telehealth: Payer: 59 | Admitting: Family Medicine

## 2018-12-11 ENCOUNTER — Other Ambulatory Visit: Payer: Self-pay

## 2018-12-11 ENCOUNTER — Telehealth (INDEPENDENT_AMBULATORY_CARE_PROVIDER_SITE_OTHER): Payer: Medicare Other | Admitting: Family Medicine

## 2018-12-11 DIAGNOSIS — G44311 Acute post-traumatic headache, intractable: Secondary | ICD-10-CM | POA: Insufficient documentation

## 2018-12-11 DIAGNOSIS — S0990XA Unspecified injury of head, initial encounter: Secondary | ICD-10-CM | POA: Diagnosis not present

## 2018-12-11 DIAGNOSIS — J302 Other seasonal allergic rhinitis: Secondary | ICD-10-CM | POA: Diagnosis not present

## 2018-12-11 NOTE — Progress Notes (Signed)
[facility] Telemedicine Visit  Patient consented to have virtual visit. Method of visit: Telephone  Encounter participants: Patient: Brianna Wheeler - located at home Provider: Joana Reamer - located at [facility] Others (if applicable): None  Chief Complaint: Head trauma  HPI: Patient was doing yard work and saw lizard and got scared and hit head against beam supporting car port yesterday. She called 911 and they evaluated her told her no need to come to ED. Patient notes swelling has decreased significantly. Denies any pain in back of head. Denies any nausea, vomiting, vision changes. Patient notes headache located along forehead. Similar to past headaches but this time wont go away with tylenol. Denies any photophobia or phonophobia. Currently pain is 4/10, improved some with tylenol and rest. Patient notes some worsening congestion, rhinorrhea and sinus pain. Currently treating with Flonase and Allegra.  ROS: per HPI  Pertinent PMHx: Allergic rhinitis,   Exam:  Respiratory: talking in full sentences, no respiratory distress Neuro: A&O x3. Speech clear.  Assessment/Plan:  Intractable acute post-traumatic headache Differential includes tension type headache vs sinus headache vs intracranial bleed. With symptoms of worsening sinus pain, this could merely be sinus headache given history of allergic rhinitis and sinus infections. However it is concerning that this headache developed 24hrs after head trauma and it is not responding well to tylenol like similar headaches have in the past. Appears patient is currently only on Aspirin. She denies any life-threatening symptoms at this time, but given her age and history of head trauma I think imaging is warranted. This was discussed with the patient. Patient notes she may have to wait till morning for her sister to take her. I instructed her to go as soon as she can and if the headache worsens any more to call EMS and go  straight to the ED. She understand and agrees to plan.     Time spent during visit with patient: 25 minutes  Orpah Cobb, DO [facility], PGY1 12/11/2018

## 2018-12-11 NOTE — Assessment & Plan Note (Addendum)
Differential includes tension type headache vs sinus headache vs intracranial bleed. With symptoms of worsening sinus pain, this could merely be sinus headache given history of allergic rhinitis and sinus infections. However it is concerning that this headache developed 24hrs after head trauma and it is not responding well to tylenol like similar headaches have in the past. Appears patient is currently only on Aspirin. She denies any life-threatening symptoms at this time, but given her age and history of head trauma I think imaging is warranted. This was discussed with the patient. Patient notes she may have to wait till morning for her sister to take her. I instructed her to go as soon as she can and if the headache worsens any more to call EMS and go straight to the ED. She understand and agrees to plan.

## 2018-12-22 ENCOUNTER — Other Ambulatory Visit: Payer: Self-pay | Admitting: Family Medicine

## 2019-01-22 ENCOUNTER — Ambulatory Visit: Payer: 59 | Admitting: Family Medicine

## 2019-01-28 ENCOUNTER — Encounter: Payer: Self-pay | Admitting: Family Medicine

## 2019-01-28 ENCOUNTER — Other Ambulatory Visit: Payer: Self-pay

## 2019-01-28 ENCOUNTER — Ambulatory Visit (INDEPENDENT_AMBULATORY_CARE_PROVIDER_SITE_OTHER): Admitting: Family Medicine

## 2019-01-28 VITALS — BP 122/78 | HR 66 | Wt 315.0 lb

## 2019-01-28 DIAGNOSIS — E039 Hypothyroidism, unspecified: Secondary | ICD-10-CM

## 2019-01-28 DIAGNOSIS — R609 Edema, unspecified: Secondary | ICD-10-CM | POA: Diagnosis not present

## 2019-01-28 DIAGNOSIS — M545 Low back pain, unspecified: Secondary | ICD-10-CM

## 2019-01-28 DIAGNOSIS — I10 Essential (primary) hypertension: Secondary | ICD-10-CM | POA: Diagnosis not present

## 2019-01-28 DIAGNOSIS — E119 Type 2 diabetes mellitus without complications: Secondary | ICD-10-CM

## 2019-01-28 DIAGNOSIS — R351 Nocturia: Secondary | ICD-10-CM | POA: Insufficient documentation

## 2019-01-28 DIAGNOSIS — I872 Venous insufficiency (chronic) (peripheral): Secondary | ICD-10-CM | POA: Insufficient documentation

## 2019-01-28 LAB — POCT URINALYSIS DIP (MANUAL ENTRY)
Bilirubin, UA: NEGATIVE
Blood, UA: NEGATIVE
Glucose, UA: NEGATIVE mg/dL
Ketones, POC UA: NEGATIVE mg/dL
Leukocytes, UA: NEGATIVE
Nitrite, UA: NEGATIVE
Protein Ur, POC: NEGATIVE mg/dL
Spec Grav, UA: 1.02 (ref 1.010–1.025)
Urobilinogen, UA: >=8 E.U./dL — AB
pH, UA: 7 (ref 5.0–8.0)

## 2019-01-28 LAB — POCT GLYCOSYLATED HEMOGLOBIN (HGB A1C): HbA1c, POC (controlled diabetic range): 6.1 % (ref 0.0–7.0)

## 2019-01-28 NOTE — Patient Instructions (Addendum)
I will call Tuesday with test results. In the meantime, watch your salt intake. Stay on all the same medications.   Sign a release so that we can get your eye doctor results about no diabetic eye problem

## 2019-01-28 NOTE — Assessment & Plan Note (Signed)
Unclear how to conceptualize her wt gain.  Is it mostly fat or mostly fluid.  I ordered several tests.

## 2019-01-28 NOTE — Progress Notes (Signed)
Established Patient Office Visit  Subjective:  Patient ID: Brianna Wheeler, female    DOB: [birth year]  Age: 63 y.o. MRN: [id number]  CC:  Chief Complaint  Patient presents with  . Back Pain    lower    HPI Brianna Wheeler presents for back pain is chronic but she has new change in urinary pattern Low back pain chronic attributed to osteoarthritis of spine.  Has hx of left sided sciatica, which is only mild at this point.  Back pain is chronic and stable. New problem is change in urine pattern.  Urinates hardly at all during the day and then has considerable nocturia.  No urgency, frequency, dysuria or fever.  No flank pain.  Reviewed med list.  Not on any new or anticholinergic meds.   She has been mostly homebound with COVID and has not been nearly as active physically Hx of DM, which has been well controled by diet.  Last A1C 3 months ago less than 7  Past Medical History:  Diagnosis Date  . Diabetes mellitus   . Hyperlipidemia   . Hypertension   . Left foot pain 2005   Chronic injury -- multiple broken bones and torn ligaments after accident in garage    Past Surgical History:  Procedure Laterality Date  . EYE SURGERY    . LUMBAR DISC SURGERY    . TUBAL LIGATION      Family History  Problem Relation Age of Onset  . Cancer Sister        brain  . Epilepsy Son     Social History   Socioeconomic History  . Marital status: Widowed    Spouse name: Not on file  . Number of children: 1  . Years of education: 11.5  . Highest education level: 11th grade  Occupational History  . 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 Financial resource strain: Not hard at all  . Food insecurity    Worry: Never true    Inability: Never true  . Transportation needs    Medical: No    Non-medical: No  Tobacco Use  . Smoking status: Never Smoker  . Smokeless tobacco: Never Used  Substance and Sexual Activity  . Alcohol use: No  . Drug  use: No  . Sexual activity: Not Currently  Lifestyle  . Physical activity    Days per week: 2 days    Minutes per session: 10 min  . Stress: Not at all  Relationships  . Social Herbalist on phone: Three times a week    Gets together: Twice a week    Attends religious service: More than 4 times per year    Active member of club or organization: Yes    Attends meetings of clubs or organizations: 1 to 4 times per year    Relationship status: Widowed  . Intimate partner violence    Fear of current or ex partner: No    Emotionally abused: No    Physically abused: No    Forced sexual activity: No  Other Topics Concern  . Not on file  Social History Narrative   Who lives with you: Lives in home with handicap son. One level home, has ramp at front door with handrails. Smoke alarms, CO2 detector, no grab bars in bathroom. No throw rugs on floor.    Any pets: none   Diet: pt has a varied diet. Tries to follow diabetic  diet (low carbs), doesn't eat bread.    Exercise: Pt reports walking across yard several times daily.   Seatbelts: Pt reports wearing seatbelt when in vehicles.       Hobbies: cleaning house, likes home and garden things.  Spending time with friends.       Outpatient Medications Prior to Visit  Medication Sig Dispense Refill  . albuterol (PROVENTIL HFA;VENTOLIN HFA) 108 (90 Base) MCG/ACT inhaler Inhale 2 puffs into the lungs every 6 (six) hours as needed for wheezing or shortness of breath. 2 Inhaler 0  . aspirin (ASPIR-81) 81 MG EC tablet Take 81 mg by mouth daily.      . Calcium Carb-Cholecalciferol (CALCIUM 1000 + D PO) Take by mouth.    . diclofenac sodium (VOLTAREN) 1 % GEL Apply 2 g topically 4 (four) times daily. 100 g 1  . fexofenadine (ALLEGRA) 180 MG tablet Take 180 mg by mouth daily.      . fluticasone (FLONASE) 50 MCG/ACT nasal spray Place 2 sprays into both nostrils daily. Place 2 sprays in each nostril daily 32 g 12  . levothyroxine (SYNTHROID) 88  MCG tablet Take 1 tablet by mouth daily 90 tablet 2  . Multiple Vitamin (MULTIVITAMIN) tablet Take 1 tablet by mouth daily.    Marland Kitchen omeprazole (PRILOSEC) 20 MG capsule Take 1 capsule (20 mg total) by mouth daily. 90 capsule 1  . pravastatin (PRAVACHOL) 20 MG tablet Take 1 tablet (20 mg total) by mouth daily. 90 tablet 3  . traMADol (ULTRAM) 50 MG tablet TAKE 1 TO 2 TABLETS BY MOUTH EVERY 12 HOURS AS NEEDED FOR PAIN 120 tablet 1  . Elastic Bandages & Supports (LUMBAR BACK BRACE/SUPPORT PAD) MISC Use as needed for support of your back and pain relief. (Patient not taking: Reported on 01/02/2017) 1 each 0  . EPINEPHrine (EPI-PEN) 0.3 mg/0.3 mL DEVI Inject 0.3 mLs (0.3 mg total) into the muscle once. 2 Device 0  . gabapentin (NEURONTIN) 300 MG capsule Take 1 capsule (300 mg total) by mouth at bedtime. (Patient not taking: Reported on 01/28/2019) 30 capsule 1  . hydrochlorothiazide (HYDRODIURIL) 25 MG tablet Take 1 tablet (25 mg total) by mouth daily. 90 tablet 1   No facility-administered medications prior to visit.     Allergies  Allergen Reactions  . Hydrocodone-Acetaminophen     REACTION: Vomited and had toruble breathing in 2009  . Nsaids     Makes sick on her stomach  . Oxycodone-Acetaminophen Other (See Comments)    Pt hallucinates with this med  . Tetracycline     REACTION: nausea  . Bactrim [Sulfamethoxazole-Trimethoprim] Rash    ROS Review of Systems    Objective:    Physical Exam  BP 122/78   Pulse 66   Wt (!) 315 lb (142.9 kg)   SpO2 98%   BMI 42.72 kg/m  Wt Readings from Last 3 Encounters:  01/28/19 (!) 315 lb (142.9 kg)  10/01/18 (!) 302 lb 6.4 oz (137.2 kg)  09/17/18 299 lb (135.6 kg)   Note wt up 13 lbs over the past 3-4 months. Lungs clear Cardiac RRR with 2/6 SEM Abd benign Ext bilateral 1+ edema.   Health Maintenance Due  Topic Date Due  . PAP SMEAR-Modifier  03/12/2012  . URINE MICROALBUMIN  06/05/2017  . OPHTHALMOLOGY EXAM  10/02/2018    There are  no preventive care reminders to display for this patient.  Lab Results  Component Value Date   TSH 7.730 (H) 11/11/2018  Lab Results  Component Value Date   WBC 4.6 09/17/2018   HGB 12.6 09/17/2018   HCT 37.9 09/17/2018   MCV 81 09/17/2018   PLT 173 09/17/2018   Lab Results  Component Value Date   NA 136 09/17/2018   K 3.6 09/17/2018   CO2 24 09/17/2018   GLUCOSE 120 (H) 09/17/2018   BUN 10 09/17/2018   CREATININE 0.88 09/17/2018   BILITOT 0.9 09/17/2018   ALKPHOS 56 09/17/2018   AST 31 09/17/2018   ALT 20 09/17/2018   PROT 7.9 09/17/2018   ALBUMIN 3.6 (L) 09/17/2018   CALCIUM 9.3 09/17/2018   Lab Results  Component Value Date   CHOL 165 09/17/2018   Lab Results  Component Value Date   HDL 41 09/17/2018   Lab Results  Component Value Date   LDLCALC 103 (H) 09/17/2018   Lab Results  Component Value Date   TRIG 107 09/17/2018   Lab Results  Component Value Date   CHOLHDL 4.0 09/17/2018   Lab Results  Component Value Date   HGBA1C 6.4 10/01/2018      Assessment & Plan:   Problem List Items Addressed This Visit    Diabetes mellitus type II, controlled ([place]) (Chronic)   Relevant Orders   POCT glycosylated hemoglobin (Hb A1C)   Microalbumin / creatinine urine ratio   Edema   Relevant Orders   Brain natriuretic peptide   HYPERTENSION, BENIGN SYSTEMIC (Chronic)   Relevant Orders   CBC   CMP14+EGFR   Hypothyroidism (Chronic)   Relevant Orders   TSH   Lumbago - Primary   Relevant Orders   POCT urinalysis dipstick      No orders of the defined types were placed in this encounter.   Follow-up: No follow-ups on file.    Zenia Resides, MD

## 2019-01-28 NOTE — Assessment & Plan Note (Signed)
Coupled with decreased urine output during the day.  Early CHF is in my differential in addition to other sodium retaining states.  She does not give any hx suggestive of obstruction or voiding difficulty.

## 2019-01-28 NOTE — Assessment & Plan Note (Signed)
Concerned control has worsened with 13 lb wt gain and being less active.

## 2019-01-28 NOTE — Assessment & Plan Note (Signed)
Recheck TSH.  Last was a bit high.  May need increased dose of levothyroxine.

## 2019-01-29 LAB — CMP14+EGFR
ALT: 16 IU/L (ref 0–32)
AST: 38 IU/L (ref 0–40)
Albumin/Globulin Ratio: 0.7 — ABNORMAL LOW (ref 1.2–2.2)
Albumin: 3.4 g/dL — ABNORMAL LOW (ref 3.8–4.8)
Alkaline Phosphatase: 49 IU/L (ref 39–117)
BUN/Creatinine Ratio: 9 — ABNORMAL LOW (ref 12–28)
BUN: 9 mg/dL (ref 8–27)
Bilirubin Total: 1.1 mg/dL (ref 0.0–1.2)
CO2: 25 mmol/L (ref 20–29)
Calcium: 9.5 mg/dL (ref 8.7–10.3)
Chloride: 98 mmol/L (ref 96–106)
Creatinine, Ser: 0.98 mg/dL (ref 0.57–1.00)
GFR calc Af Amer: 71 mL/min/{1.73_m2} (ref >59)
GFR calc non Af Amer: 61 mL/min/{1.73_m2} (ref >59)
Globulin, Total: 4.6 g/dL — ABNORMAL HIGH (ref 1.5–4.5)
Glucose: 83 mg/dL (ref 65–99)
Potassium: 3.6 mmol/L (ref 3.5–5.2)
Sodium: 135 mmol/L (ref 134–144)
Total Protein: 8 g/dL (ref 6.0–8.5)

## 2019-01-29 LAB — MICROALBUMIN / CREATININE URINE RATIO
Creatinine, Urine: 133.8 mg/dL
Microalb/Creat Ratio: 3 mg/g creat (ref 0–29)
Microalbumin, Urine: 4.6 ug/mL

## 2019-01-29 LAB — CBC
Hematocrit: 35.8 % (ref 34.0–46.6)
Hemoglobin: 11.9 g/dL (ref 11.1–15.9)
MCH: 26.5 pg — ABNORMAL LOW (ref 26.6–33.0)
MCHC: 33.2 g/dL (ref 31.5–35.7)
MCV: 80 fL (ref 79–97)
Platelets: 165 10*3/uL (ref 150–450)
RBC: 4.49 x10E6/uL (ref 3.77–5.28)
RDW: 15.4 % (ref 11.7–15.4)
WBC: 3.8 10*3/uL (ref 3.4–10.8)

## 2019-01-29 LAB — TSH: TSH: 4.88 u[IU]/mL — ABNORMAL HIGH (ref 0.450–4.500)

## 2019-01-29 LAB — BRAIN NATRIURETIC PEPTIDE: BNP: 17.9 pg/mL (ref 0.0–100.0)

## 2019-02-03 ENCOUNTER — Telehealth: Payer: Self-pay | Admitting: Family Medicine

## 2019-02-03 NOTE — Telephone Encounter (Signed)
Given results

## 2019-02-03 NOTE — Addendum Note (Signed)
Addended by: Zenia Resides on: 02/03/2019 02:15 PM   Modules accepted: Orders

## 2019-02-03 NOTE — Telephone Encounter (Signed)
Pt is calling to check on the status of receiving the results from her labs she had done last week.   Pt would like for someone to call with these results.

## 2019-02-03 NOTE — Assessment & Plan Note (Signed)
Will check protein elctrophoresis for the abnormal AG ratio and back pain

## 2019-02-05 ENCOUNTER — Other Ambulatory Visit: Payer: Self-pay

## 2019-02-05 ENCOUNTER — Other Ambulatory Visit: Payer: Medicare Other

## 2019-02-05 DIAGNOSIS — M545 Low back pain, unspecified: Secondary | ICD-10-CM

## 2019-02-10 ENCOUNTER — Telehealth: Payer: Self-pay | Admitting: *Deleted

## 2019-02-10 DIAGNOSIS — D892 Hypergammaglobulinemia, unspecified: Secondary | ICD-10-CM

## 2019-02-10 LAB — PROTEIN ELECTROPHORESIS, SERUM
A/G Ratio: 0.7 (ref 0.7–1.7)
Albumin ELP: 3.2 g/dL (ref 2.9–4.4)
Alpha 1: 0.2 g/dL (ref 0.0–0.4)
Alpha 2: 0.6 g/dL (ref 0.4–1.0)
Beta: 1.3 g/dL (ref 0.7–1.3)
Gamma Globulin: 2.5 g/dL — ABNORMAL HIGH (ref 0.4–1.8)
Globulin, Total: 4.6 g/dL — ABNORMAL HIGH (ref 2.2–3.9)
M-Spike, %: 2.1 g/dL — ABNORMAL HIGH
Total Protein: 7.8 g/dL (ref 6.0–8.5)

## 2019-02-10 NOTE — Telephone Encounter (Signed)
Pt is calling for results of her most recent lab results. Christen Bame, CMA

## 2019-02-11 DIAGNOSIS — D892 Hypergammaglobulinemia, unspecified: Secondary | ICD-10-CM | POA: Insufficient documentation

## 2019-02-11 NOTE — Telephone Encounter (Signed)
I gave a vague description that her blood work showed an abnormal protein that needed further work up.  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  Placed a referral to hematology for further WU.

## 2019-02-11 NOTE — Assessment & Plan Note (Signed)
Unclear significance.  Will refer to heme for further investigation.

## 2019-02-18 ENCOUNTER — Telehealth: Payer: Self-pay | Admitting: Oncology

## 2019-02-18 NOTE — Telephone Encounter (Signed)
Received a new patient referral from Dr. Andria Frames for serum gammaglobulin increased. [name] has been cld and scheduled to see Dr. Alen Blew on 8/13 at 2:15pm. She's been made aware to arrive 15 minutes early.

## 2019-02-23 ENCOUNTER — Encounter: Payer: Self-pay | Admitting: Family Medicine

## 2019-02-27 ENCOUNTER — Other Ambulatory Visit: Payer: Self-pay | Admitting: *Deleted

## 2019-02-27 DIAGNOSIS — M5441 Lumbago with sciatica, right side: Secondary | ICD-10-CM

## 2019-02-28 MED ORDER — TRAMADOL HCL 50 MG PO TABS: ORAL_TABLET | ORAL | 1 refills | Status: DC

## 2019-03-05 ENCOUNTER — Inpatient Hospital Stay: Payer: Medicare Other | Attending: Oncology | Admitting: Oncology

## 2019-03-19 ENCOUNTER — Other Ambulatory Visit: Payer: Self-pay

## 2019-03-19 ENCOUNTER — Ambulatory Visit (INDEPENDENT_AMBULATORY_CARE_PROVIDER_SITE_OTHER): Payer: Medicare Other | Admitting: *Deleted

## 2019-03-19 DIAGNOSIS — Z23 Encounter for immunization: Secondary | ICD-10-CM

## 2019-03-19 NOTE — Progress Notes (Signed)
Pt tolerated flu vaccine well. Vester Balthazor, CMA  

## 2019-03-20 ENCOUNTER — Ambulatory Visit: Payer: Medicare Other | Admitting: Family Medicine

## 2019-03-24 ENCOUNTER — Ambulatory Visit: Payer: Medicare Other | Admitting: Sports Medicine

## 2019-03-27 ENCOUNTER — Other Ambulatory Visit: Payer: Self-pay

## 2019-03-27 ENCOUNTER — Encounter: Payer: Self-pay | Admitting: Family Medicine

## 2019-03-27 ENCOUNTER — Ambulatory Visit (INDEPENDENT_AMBULATORY_CARE_PROVIDER_SITE_OTHER): Admitting: Family Medicine

## 2019-03-27 VITALS — BP 130/70 | Ht 71.0 in | Wt 290.0 lb

## 2019-03-27 DIAGNOSIS — S90122A Contusion of left lesser toe(s) without damage to nail, initial encounter: Secondary | ICD-10-CM

## 2019-03-27 NOTE — Progress Notes (Signed)
  Brianna Wheeler - 63 y.o. female MRN [id number]  Date of birth: 1955/09/15    SUBJECTIVE:      Chief Complaint:/ HPI:  Left foot pain Days ago she dropped a coffee table on her left foot.  The bottom part of the coffee table leg impacted her midfoot on the top portion.  It was very sore for couple of days.  It is getting better.  She wants to make sure that she did not break anything.  She has been weightbearing and walking since then with some pain initially but that is improving.  No numbness or tingling in her foot.   ROS:     See HPI.  Mild bruising noted in the first day after the injury but now has resolved.  The area also feels a little bit swollen.  No fever, no cough.  PERTINENT  PMH / PSH FH / / SH:  Past Medical, Surgical, Social, and Family History Reviewed & Updated in the EMR.  Pertinent findings include:  Diabetes mellitus  OBJECTIVE: BP 130/70   Ht 5\' 11"  (1.803 m)   Wt 290 lb (131.5 kg)   BMI 40.45 kg/m   Physical Exam:  Vital signs are reviewed. GENERAL: Well-developed overweight female no acute distress ANKLES: Symmetrical full range of motion bilaterally.  No effusion FEET: Cavus foot bilaterally.  First MTP bilaterally with some mild to moderate bunion formation.  Mild tenderness to palpation over the proximal midfoot on the left.  There is very small amount of soft tissue swelling here.  Plantar and dorsiflexion is painless.  Toe motion intact and painless. ULTRASOUND: Brief look at the midfoot bones and no sign of any bony pathology specifically no cortical defects.  ASSESSMENT & PLAN:  See problem based charting & AVS for pt instructions. No problem-specific Assessment & Plan notes found for this encounter. Contusion of the dorsum of the left foot.  No fracture is evident.  Pain is improving.  She has underlying cavus feet with some early bunion formation.  I think she would benefit overall from some green sports insole so we gave her a pair of those today.   She will follow-up PRN.

## 2019-04-22 ENCOUNTER — Other Ambulatory Visit: Payer: Self-pay

## 2019-04-22 ENCOUNTER — Encounter: Payer: Self-pay | Admitting: Family Medicine

## 2019-04-22 ENCOUNTER — Ambulatory Visit (INDEPENDENT_AMBULATORY_CARE_PROVIDER_SITE_OTHER): Admitting: Family Medicine

## 2019-04-22 DIAGNOSIS — M5416 Radiculopathy, lumbar region: Secondary | ICD-10-CM | POA: Diagnosis not present

## 2019-04-22 MED ORDER — GABAPENTIN 300 MG PO CAPS: 300.0000 mg | ORAL_CAPSULE | Freq: Every day | ORAL | 3 refills | Status: DC

## 2019-04-22 NOTE — Progress Notes (Signed)
Established Patient Office Visit  Subjective:  Patient ID: Brianna Wheeler, female    DOB: Dec 31, 1955  Age: 63 y.o. MRN: [id number]  CC:  Chief Complaint  Patient presents with  . Back Pain    HPI Brianna Wheeler presents for acute on chronic low back pain.  Complicated by morbid obesity and previous low back surgery.  Current flair began about 20 days ago.  Back spasmed when picking up a laundry basket.  Now with lower lumbar pain and radicular pain down right lateral hip to knee.  No weakness, bowel or bladder problems.  Pain is not improving.  Currently taking tramadol and voltaren gel,  Not on gabapentin. No known cancer.  Not immunocompromised.  Brings in soft back bace which she does not know how to put on.    Past Medical History:  Diagnosis Date  . Diabetes mellitus   . Hyperlipidemia   . Hypertension   . Left foot pain 2005   Chronic injury -- multiple broken bones and torn ligaments after accident in garage    Past Surgical History:  Procedure Laterality Date  . EYE SURGERY    . LUMBAR DISC SURGERY    . TUBAL LIGATION      Family History  Problem Relation Age of Onset  . Cancer Sister        brain  . Epilepsy Son     Social History   Socioeconomic History  . Marital status: Widowed    Spouse name: Not on file  . Number of children: 1  . Years of education: 11.5  . Highest education level: 11th grade  Occupational History  . 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 Financial resource strain: Not hard at all  . Food insecurity    Worry: Never true    Inability: Never true  . Transportation needs    Medical: No    Non-medical: No  Tobacco Use  . Smoking status: Never Smoker  . Smokeless tobacco: Never Used  Substance and Sexual Activity  . Alcohol use: No  . Drug use: No  . Sexual activity: Not Currently  Lifestyle  . Physical activity    Days per week: 2 days    Minutes per session: 10 min  .  Stress: Not at all  Relationships  . Social Musician on phone: Three times a week    Gets together: Twice a week    Attends religious service: More than 4 times per year    Active member of club or organization: Yes    Attends meetings of clubs or organizations: 1 to 4 times per year    Relationship status: Widowed  . Intimate partner violence    Fear of current or ex partner: No    Emotionally abused: No    Physically abused: No    Forced sexual activity: No  Other Topics Concern  . Not on file  Social History Narrative   Who lives with you: Lives in home with handicap son. One level home, has ramp at front door with handrails. Smoke alarms, CO2 detector, no grab bars in bathroom. No throw rugs on floor.    Any pets: none   Diet: pt has a varied diet. Tries to follow diabetic diet (low carbs), doesn't eat bread.    Exercise: Pt reports walking across yard several times daily.   Seatbelts: Pt reports wearing seatbelt when in vehicles.  Hobbies: cleaning house, likes home and garden things.  Spending time with friends.       Outpatient Medications Prior to Visit  Medication Sig Dispense Refill  . albuterol (PROVENTIL HFA;VENTOLIN HFA) 108 (90 Base) MCG/ACT inhaler Inhale 2 puffs into the lungs every 6 (six) hours as needed for wheezing or shortness of breath. 2 Inhaler 0  . aspirin (ASPIR-81) 81 MG EC tablet Take 81 mg by mouth daily.      . Calcium Carb-Cholecalciferol (CALCIUM 1000 + D PO) Take by mouth.    . diclofenac sodium (VOLTAREN) 1 % GEL Apply 2 g topically 4 (four) times daily. 100 g 1  . Elastic Bandages & Supports (LUMBAR BACK BRACE/SUPPORT PAD) MISC Use as needed for support of your back and pain relief. (Patient not taking: Reported on 01/02/2017) 1 each 0  . EPINEPHrine (EPI-PEN) 0.3 mg/0.3 mL DEVI Inject 0.3 mLs (0.3 mg total) into the muscle once. 2 Device 0  . fexofenadine (ALLEGRA) 180 MG tablet Take 180 mg by mouth daily.      . fluticasone  (FLONASE) 50 MCG/ACT nasal spray Place 2 sprays into both nostrils daily. Place 2 sprays in each nostril daily 32 g 12  . hydrochlorothiazide (HYDRODIURIL) 25 MG tablet Take 1 tablet (25 mg total) by mouth daily. 90 tablet 1  . levothyroxine (SYNTHROID) 88 MCG tablet Take 1 tablet by mouth daily 90 tablet 2  . Multiple Vitamin (MULTIVITAMIN) tablet Take 1 tablet by mouth daily.    Marland Kitchen. omeprazole (PRILOSEC) 20 MG capsule Take 1 capsule (20 mg total) by mouth daily. 90 capsule 1  . pravastatin (PRAVACHOL) 20 MG tablet Take 1 tablet (20 mg total) by mouth daily. 90 tablet 3  . traMADol (ULTRAM) 50 MG tablet TAKE 1 TO 2 TABLETS BY MOUTH EVERY 12 HOURS AS NEEDED FOR PAIN 120 tablet 1  . gabapentin (NEURONTIN) 300 MG capsule Take 1 capsule (300 mg total) by mouth at bedtime. (Patient not taking: Reported on 01/28/2019) 30 capsule 1   No facility-administered medications prior to visit.     Allergies  Allergen Reactions  . Hydrocodone-Acetaminophen     REACTION: Vomited and had toruble breathing in 2009  . Nsaids     Makes sick on her stomach  . Oxycodone-Acetaminophen Other (See Comments)    Pt hallucinates with this med  . Tetracycline     REACTION: nausea  . Bactrim [Sulfamethoxazole-Trimethoprim] Rash    ROS Review of Systems    Objective:    Physical Exam  BP 140/78   Pulse 79   Wt (!) 325 lb 12.8 oz (147.8 kg)   SpO2 97%   BMI 45.44 kg/m  Wt Readings from Last 3 Encounters:  04/22/19 (!) 325 lb 12.8 oz (147.8 kg)  03/27/19 290 lb (131.5 kg)  01/28/19 (!) 315 lb (142.9 kg)   Bilateral lower lumbar pain.  Normal strength in right leg.  Normal reflexes.  Gait mildly antalgic.   Health Maintenance Due  Topic Date Due  . COLONOSCOPY  06/23/2004  . PAP SMEAR-Modifier  03/12/2012  . OPHTHALMOLOGY EXAM  12/03/2018  . MAMMOGRAM  03/22/2019  . FOOT EXAM  03/26/2019    There are no preventive care reminders to display for this patient.  Lab Results  Component Value Date    TSH 4.880 (H) 01/28/2019   Lab Results  Component Value Date   WBC 3.8 01/28/2019   HGB 11.9 01/28/2019   HCT 35.8 01/28/2019   MCV 80 01/28/2019  PLT 165 01/28/2019   Lab Results  Component Value Date   NA 135 01/28/2019   K 3.6 01/28/2019   CO2 25 01/28/2019   GLUCOSE 83 01/28/2019   BUN 9 01/28/2019   CREATININE 0.98 01/28/2019   BILITOT 1.1 01/28/2019   ALKPHOS 49 01/28/2019   AST 38 01/28/2019   ALT 16 01/28/2019   PROT 7.8 02/05/2019   ALBUMIN 3.4 (L) 01/28/2019   CALCIUM 9.5 01/28/2019   Lab Results  Component Value Date   CHOL 165 09/17/2018   Lab Results  Component Value Date   HDL 41 09/17/2018   Lab Results  Component Value Date   LDLCALC 103 (H) 09/17/2018   Lab Results  Component Value Date   TRIG 107 09/17/2018   Lab Results  Component Value Date   CHOLHDL 4.0 09/17/2018   Lab Results  Component Value Date   HGBA1C 6.1 01/28/2019      Assessment & Plan:   Problem List Items Addressed This Visit    Lumbar back pain with radiculopathy affecting right lower extremity   Relevant Medications   gabapentin (NEURONTIN) 300 MG capsule      Meds ordered this encounter  Medications  . gabapentin (NEURONTIN) 300 MG capsule    Sig: Take 1 capsule (300 mg total) by mouth at bedtime.    Dispense:  90 capsule    Refill:  3    Follow-up: No follow-ups on file.    Zenia Resides, MD

## 2019-04-22 NOTE — Assessment & Plan Note (Signed)
Chronic low back pain now with radiculopathy but without red flag concerns.  Add gabapentin for neuropathic pain.

## 2019-04-22 NOTE — Patient Instructions (Signed)
Keep using the tramadol and voltaren gel for the back pain (regular pain.)   OK to use the brace some when you are active.  Do not use it all the time I sent in a new prescription for gabapentin for the leg pain=nerve pain. Touch base with me in one month. Call if you are doing well. See me if we need to make additional changes.

## 2019-04-24 ENCOUNTER — Telehealth: Payer: Self-pay

## 2019-04-24 NOTE — Telephone Encounter (Signed)
Patient calls nurse line stating she took Gabapentin 300mg  last night and slept 14 hours and was hard to arouse. Patient stated she would like to try a smaller dose, she would half, however they are capsule form. Please advise.

## 2019-04-27 MED ORDER — GABAPENTIN 100 MG PO CAPS: 100.0000 mg | ORAL_CAPSULE | Freq: Three times a day (TID) | ORAL | 3 refills | Status: DC

## 2019-04-27 NOTE — Telephone Encounter (Signed)
Pt called nurse line again to find out if a lower dose was being called in Brianna Wheeler, [place]

## 2019-04-27 NOTE — Telephone Encounter (Signed)
Called patient and new Rx sent.

## 2019-05-15 ENCOUNTER — Ambulatory Visit (INDEPENDENT_AMBULATORY_CARE_PROVIDER_SITE_OTHER): Payer: Medicare Other | Admitting: Family Medicine

## 2019-05-15 ENCOUNTER — Encounter: Payer: Self-pay | Admitting: Family Medicine

## 2019-05-15 ENCOUNTER — Other Ambulatory Visit: Payer: Self-pay

## 2019-05-15 VITALS — BP 147/78 | Ht 71.0 in | Wt 325.0 lb

## 2019-05-15 DIAGNOSIS — M5416 Radiculopathy, lumbar region: Secondary | ICD-10-CM

## 2019-05-15 DIAGNOSIS — M7741 Metatarsalgia, right foot: Secondary | ICD-10-CM

## 2019-05-15 DIAGNOSIS — M7742 Metatarsalgia, left foot: Secondary | ICD-10-CM

## 2019-05-16 NOTE — Progress Notes (Signed)
  Brianna Wheeler - 63 y.o. female MRN [id number]  Date of birth: Apr 13, 1956    SUBJECTIVE:      Chief Complaint:/ HPI:  Complaint of bilateral foot pain.  She is wearing temporary insoles with scaphoid pads but wonders if we could add some type of metatarsal support.  She still having a lot of forefoot pain.  Worse if she stands for long periods of time. 2.  Low back pain: Chronic.  Followed by her PCP.  She has previously been evaluated by back surgeon was told that "I would risk paralysis if I had surgery".   ROS:     No unusual weight change, no cough, no fever.  No current radicular symptoms in the lower extremities.  Foot pain as per HPI  PERTINENT  PMH / PSH FH / / SH:  Past Medical, Surgical, Social, and Family History Reviewed & Updated in the EMR.  Pertinent findings include:    OBJECTIVE: BP (!) 147/78   Ht 5\' 11"  (1.803 m)   Wt (!) 325 lb (147.4 kg)   BMI 45.33 kg/m   Physical Exam:  Vital signs are reviewed. GENERAL: Well-developed overweight female no acute distress BACK: Tender to palpation over the PSIS area and lumbar musculature  Vertebrally in the L4-5 area.  She has limited flexion at the hips to about 80 degrees.  Hyperextension is also limited secondary to pain. FEET: Bilaterally has pes planus.  She also has a long second toe.  Mild tender to palpation across the metatarsal heads of both feet. vascular: Dorsalis pedis pulses are 2+ bilateral are symmetrical NEURO: Intact sensation soft touch bilateral lower extremity  ASSESSMENT & PLAN:  See problem based charting & AVS for pt instructions. Lumbar back pain with radiculopathy affecting right lower extremity Chronic low back pain.  I will let her PCP continue to follow this.  We did discuss possibly epidural steroid injection or facet joint injections.  It seems that a lot of her pain is coming from the facet area as she is having significant pain today but no radiculopathy.  Her PCP has placed her on Neurontin  appropriately.  Metatarsalgia of both feet Think she is getting some benefit from the scaphoid pads.  Today I placed metatarsal pads on both insoles.  Size small.  Improve her foot mechanics.  Hopefully this will also improve her pain.  At some point we could make her custom molded orthotics as she seems to have significant improvement with these temporary insoles.  She will follow-up as needed.

## 2019-05-16 NOTE — Assessment & Plan Note (Signed)
Think she is getting some benefit from the scaphoid pads.  Today I placed metatarsal pads on both insoles.  Size small.  Improve her foot mechanics.  Hopefully this will also improve her pain.  At some point we could make her custom molded orthotics as she seems to have significant improvement with these temporary insoles.  She will follow-up as needed.

## 2019-05-16 NOTE — Assessment & Plan Note (Signed)
Chronic low back pain.  I will let her PCP continue to follow this.  We did discuss possibly epidural steroid injection or facet joint injections.  It seems that a lot of her pain is coming from the facet area as she is having significant pain today but no radiculopathy.  Her PCP has placed her on Neurontin appropriately.

## 2019-05-18 ENCOUNTER — Other Ambulatory Visit: Payer: Self-pay | Admitting: *Deleted

## 2019-05-18 MED ORDER — HYDROCHLOROTHIAZIDE 25 MG PO TABS: 25.0000 mg | ORAL_TABLET | Freq: Every day | ORAL | 3 refills | Status: DC

## 2019-05-26 ENCOUNTER — Other Ambulatory Visit: Payer: Self-pay | Admitting: *Deleted

## 2019-05-26 DIAGNOSIS — M5441 Lumbago with sciatica, right side: Secondary | ICD-10-CM

## 2019-05-26 MED ORDER — TRAMADOL HCL 50 MG PO TABS: ORAL_TABLET | ORAL | 1 refills | Status: DC

## 2019-06-24 ENCOUNTER — Other Ambulatory Visit: Payer: Self-pay | Admitting: *Deleted

## 2019-06-24 MED ORDER — OMEPRAZOLE 20 MG PO CPDR: 20.0000 mg | DELAYED_RELEASE_CAPSULE | Freq: Every day | ORAL | 3 refills | Status: DC

## 2019-07-01 ENCOUNTER — Telehealth: Payer: Self-pay | Admitting: *Deleted

## 2019-07-01 MED ORDER — FLUTICASONE PROPIONATE 50 MCG/ACT NA SUSP: 2.0000 | Freq: Every day | NASAL | 12 refills | Status: DC

## 2019-07-01 NOTE — Telephone Encounter (Signed)
Pt wants to know what she can use in place of flonase, as it is not helping her. Christen Bame, CMA

## 2019-07-01 NOTE — Telephone Encounter (Signed)
CAlled.  Refilled flonase.  She already takes Human resources officer.  Told her she could use Afrin but no more than 2 days in a row.

## 2019-07-22 ENCOUNTER — Telehealth: Payer: Self-pay | Admitting: Family Medicine

## 2019-07-22 DIAGNOSIS — M5441 Lumbago with sciatica, right side: Secondary | ICD-10-CM

## 2019-07-22 MED ORDER — TRAMADOL HCL 50 MG PO TABS: ORAL_TABLET | ORAL | 5 refills | Status: DC

## 2019-07-22 NOTE — Telephone Encounter (Signed)
Rx sent as requested.

## 2019-07-22 NOTE — Telephone Encounter (Signed)
Patient requesting refill on Tramadol be sent to [facility] on [address]. Pharmacy says she has no more refills available right now.

## 2019-07-27 ENCOUNTER — Other Ambulatory Visit: Payer: Self-pay

## 2019-07-27 MED ORDER — LEVOTHYROXINE SODIUM 88 MCG PO TABS: 88.0000 ug | ORAL_TABLET | Freq: Every day | ORAL | 2 refills | Status: DC

## 2019-07-27 NOTE — Telephone Encounter (Signed)
Pt Levothyroxin , Per pharmacy: Sandoz generic is no linger available, may we change to Alvogen?  Sunday Spillers, CMA

## 2019-09-01 ENCOUNTER — Telehealth: Payer: Self-pay

## 2019-09-01 NOTE — Telephone Encounter (Signed)
Patient calls nurse line related to soreness after fall on Friday 2/5. Patient states that she fells out of bed reaching for her phone and hit her back and knees. Did not hit head or lose consciousness. States that she is sore and bruised. She reports using tylenol, tramadol and voltaren cream for the pain. Advised that she could use ice packs on areas that was experiencing swelling. Pt states that she is able to walk and take care of herself as usual, just increased soreness. Patient reports that she will continue what she has been doing and will follow up in the next few days in the office if pain has not improved.

## 2019-09-02 NOTE — Telephone Encounter (Signed)
Noted and agree. 

## 2019-10-26 ENCOUNTER — Telehealth: Payer: Self-pay

## 2019-10-26 NOTE — Telephone Encounter (Signed)
Patient calls nurse line with reports of sciatic exacerbation. Pt reports tingling down R leg primarily, however, some tingling in left leg as well. Patient has been taking tramadol and gabapentin for pain. Patient requests advice from Dr. Leveda Anna regarding pain management. Offered patient appointment, patient declined and states she will wait for instruction from Dr. Leveda Anna.   Veronda Prude, RN

## 2019-10-26 NOTE — Telephone Encounter (Signed)
Low back pain after moving heavy appliances (water leak)  Now with severe low back and right leg pain.  No fever, chills, weakness, bowel or bladder changes.  Taking gabapentin and tramadol. Applying voltaren gel Cannot take NSAIDs due to GI intollerance.  Discussed options. 1. Add ice or heat - either fine: She will 2. Add antiinflamatory: Short course NSAID or pred - not yet. 3. Increase gabapentin dose. Currently taking 300 tid.  She will increase to 300 bid and 600 qhs.    She will call back if not improving on the acute on chronic low back pain.

## 2019-11-06 ENCOUNTER — Other Ambulatory Visit: Payer: Self-pay | Admitting: Family Medicine

## 2019-11-06 DIAGNOSIS — Z1231 Encounter for screening mammogram for malignant neoplasm of breast: Secondary | ICD-10-CM

## 2019-12-03 ENCOUNTER — Ambulatory Visit: Payer: Medicare Other

## 2019-12-09 ENCOUNTER — Telehealth: Payer: Self-pay

## 2019-12-09 NOTE — Telephone Encounter (Signed)
Called patient to reschedule mychart video visit due to scheduling error. Patient reports pressure headaches that extend into eyes, and feeling "clogged up" in nose. Patient denies runny nose, fever, and cough. Patient states that she has been using allegra, Flonase and tylenol for symptom management with little relief. Rescheduled patient for Friday morning with access to care (Dr. Annia Friendly).   Please advise if there is anything patient can do in the meantime. Patient requesting antibiotic as she believes she has a sinus infection. Advised patient to keep virtual visit on Friday and we will reach out with further instructions.    To PCP and Dr. Carmon Sails, RN

## 2019-12-10 ENCOUNTER — Telehealth: Payer: Medicare Other

## 2019-12-10 NOTE — Telephone Encounter (Signed)
Noted and agree. 

## 2019-12-11 ENCOUNTER — Other Ambulatory Visit: Payer: Self-pay

## 2019-12-11 ENCOUNTER — Telehealth (INDEPENDENT_AMBULATORY_CARE_PROVIDER_SITE_OTHER): Payer: Medicare Other | Admitting: Family Medicine

## 2019-12-11 DIAGNOSIS — J302 Other seasonal allergic rhinitis: Secondary | ICD-10-CM

## 2019-12-11 NOTE — Assessment & Plan Note (Addendum)
Resolved with conservative measures.  No red flags to history to suggest concern for bacterial sinusitis, may have additional viral component but reassuringly has substantially improved.  Low concern for asthma exacerbation without concurrent coughing or wheezing. Recommended continuing saline nasal washes for the next week, appropriate hydration, and chronic allergy medications. Avoid triggers as possible.  Follow-up if symptoms recurrent and persistent without relief from home measures.

## 2019-12-11 NOTE — Progress Notes (Signed)
[facility] Telemedicine Visit  Patient consented to have virtual visit and was identified by name and date of birth. Method of visit: Video  Encounter participants: Patient: Brianna Wheeler - located at home  Provider: Allayne Stack - located at [facility] Others (if applicable): none   Chief Complaint: Sinus troubles   HPI: [name] is a 64 year old female presenting discussed the following:  Allergies: Started on 3 days ago, had HA, sinus fullness, and nasal congestion. Started after she was working in the yard while her son was mowing the lawn.  Fortunately, she states she is doing a lot better and no longer has any symptoms today.  She has been using Allegra, Flonase, and Tylenol as needed.  She also has been doing saline nasal washes that have been greatly helpful.  Denies any concurrent fever, cough, shortness of breath, sore throat.  No known Covid exposures or contacts.   ROS: per HPI  Pertinent PMHx: Hypertension, asthma, seasonal allergic rhinitis, type 2 diabetes, hyperlipidemia  Exam:  There were no vitals taken for this visit.  General: No acute distress, laying in her bed comfortably HEENT: MMM, do not appreciate congestion Respiratory: unlabored breathing, no coughing   Assessment/Plan:  Allergic rhinitis Resolved with conservative measures.  No red flags to history to suggest concern for bacterial sinusitis, may have additional viral component but reassuringly has substantially improved.  Low concern for asthma exacerbation without concurrent coughing or wheezing. Recommended continuing saline nasal washes for the next week, appropriate hydration, and chronic allergy medications. Avoid triggers as possible.  Follow-up if symptoms recurrent and persistent without relief from home measures.     Follow-up if symptoms recur and worsen, otherwise recommended regular follow-up with PCP for diabetes and hypertension.  Time spent during visit with  patient: 8 minutes  Allayne Stack, DO

## 2020-01-12 ENCOUNTER — Other Ambulatory Visit: Payer: Self-pay

## 2020-01-12 ENCOUNTER — Ambulatory Visit (INDEPENDENT_AMBULATORY_CARE_PROVIDER_SITE_OTHER): Payer: Medicare Other | Admitting: Family Medicine

## 2020-01-12 ENCOUNTER — Encounter: Payer: Self-pay | Admitting: Family Medicine

## 2020-01-12 ENCOUNTER — Telehealth: Payer: Self-pay | Admitting: Family Medicine

## 2020-01-12 VITALS — BP 130/72 | HR 78 | Ht 71.0 in | Wt 313.6 lb

## 2020-01-12 DIAGNOSIS — Z23 Encounter for immunization: Secondary | ICD-10-CM

## 2020-01-12 DIAGNOSIS — Z1211 Encounter for screening for malignant neoplasm of colon: Secondary | ICD-10-CM

## 2020-01-12 DIAGNOSIS — E119 Type 2 diabetes mellitus without complications: Secondary | ICD-10-CM

## 2020-01-12 DIAGNOSIS — W19XXXA Unspecified fall, initial encounter: Secondary | ICD-10-CM | POA: Diagnosis not present

## 2020-01-12 LAB — POCT GLYCOSYLATED HEMOGLOBIN (HGB A1C): HbA1c, POC (controlled diabetic range): 6 % (ref 0.0–7.0)

## 2020-01-12 NOTE — Telephone Encounter (Signed)
**  After Hours/ Emergency Line Call**  Received a call to report that Brianna Wheeler she received her pneumonia vaccine today and is now having a headache.  Has been having a frontal headache for a few hours.  States that she does not frequently get headaches, when she has had headaches in the past they have been sinus headaches.  States that she just overall feels unwell.  Does not have a current known fever.  Denies any changes in vision.  Denies any vomiting.  Denies any focal weakness.  States that she fell 8 days ago on both knees and that is why she came to the doctor today.  Did not hit her head.  Reports that at 9 PM she will be taking her scheduled Tylenol and Toradol as she usually does for chronic back pain.  She currently rates the pain 6/10.  Advised that she can take this medication if it does not improve her headache she can be seen in the ED.  Also reported that if she has any changes in vision, vomiting, focal weakness, severe pain, that she should be seen right away.  She voiced understanding.  Advised that if she has a headache tomorrow, she can call the clinic to schedule an appointment for seeing the morning.  Red flags discussed.  Will forward to PCP.  Luis Abed, DO PGY-2, [facility] 01/12/2020 8:06 PM

## 2020-01-12 NOTE — Patient Instructions (Addendum)
Today we did a knee exam because you fell and hurt your left knee. I agree that it is bruised but it does appear to be very stable and I do not see anything that means we would need a knee x-ray at this point. I am glad you are still able to walk as well as you have been in the past. We also checked your diabetes number which was a 6.0. This is well controlled and I do not think that we need to be adding any diabetes medication at this time. Please continue to work on controlling this with your diet and a goal of weight loss.  The Cologuard test for colon cancer screening will be mailed to you, if you do not get this in the next few weeks please let us know  Let us know if there is nothing else that we can do for you

## 2020-01-13 DIAGNOSIS — Z23 Encounter for immunization: Secondary | ICD-10-CM | POA: Insufficient documentation

## 2020-01-13 DIAGNOSIS — W19XXXA Unspecified fall, initial encounter: Secondary | ICD-10-CM | POA: Insufficient documentation

## 2020-01-13 NOTE — Assessment & Plan Note (Signed)
No symptoms, patient consents to Cologuard test being ordered

## 2020-01-13 NOTE — Assessment & Plan Note (Addendum)
Patient fell 8 days ago, tripped on an item that she did not see standing there as they were for member staying with them and had multiple items around the house she was not used to seeing.  Fell forward from standing height onto both knees.  Was able to get up on her own, no vertigo or head injury, has been able to ambulate since at baseline.  Just complains that she had some bruising on her left knee that she wanted to make sure was not a problem.  No distal deficits or muscle weakness.  Physical exam indicates no joint laxity, there is no pain with patella manipulation.  There is some pain with manipulation of the bruise on the lateral/anterior knee but no pain with motion of the knee itself.  She is ambulating at what she describes as baseline.  Can continue home pain regimen but do not see indication for imaging at this time.  Patient agrees with plan and return precautions were discussed

## 2020-01-13 NOTE — Progress Notes (Signed)
° ° °  SUBJECTIVE:   CHIEF COMPLAINT / HPI: Fall 8 days ago  Fall Patient fell 8 days ago, tripped on an item that she did not see standing there as they were for member staying with them and had multiple items around the house she was not used to seeing.  Fell forward from standing height onto both knees.  Was able to get up on her own, no vertigo or head injury, has been able to ambulate since at baseline.  Just complains that she had some bruising on her left knee that she wanted to make sure was not a problem.  No distal deficits or muscle weakness.  Patient ambulates with a cane at baseline, says this is unchanged since her fall.  Says she has been able to alleviate the trip as her daughter home and is not concerned of a repeat fall at this point  PERTINENT  PMH / PSH:   OBJECTIVE:   BP 130/72    Pulse 78    Ht 5\' 11"  (1.803 m)    Wt (!) 313 lb 9.6 oz (142.2 kg)    SpO2 97%    BMI 43.74 kg/m   General: Alert and pleasant, no medical distress Respiratory: No increased work of breathing, no cough Cardiac: Regular rate and rhythm Left knee exam: No valgus varus laxity, anterior posterior drawer negative, no pain with manipulation of patella.  No distal perfusion or sensation deficits.  No warmth consistent with septic joint or cellulitis.  Skin is intact but there is an anterolateral appropriate healing bruise approximately 2 to 3 inches diameter.. Patient ambulates with a cane at baseline and says this has not changed since her fall.    ASSESSMENT/PLAN:   Colon cancer screening No symptoms, patient consents to Cologuard test being ordered  Diabetes mellitus type II, controlled (HCC) A1c well within goal at 6.0, no change at this time  Need for vaccination with 13-polyvalent pneumococcal conjugate vaccine Patient consents pneumococcal vaccine  Fall Patient fell 8 days ago, tripped on an item that she did not see standing there as they were for member staying with them and had multiple  items around the house she was not used to seeing.  Fell forward from standing height onto both knees.  Was able to get up on her own, no vertigo or head injury, has been able to ambulate since at baseline.  Just complains that she had some bruising on her left knee that she wanted to make sure was not a problem.  No distal deficits or muscle weakness.  Physical exam indicates no joint laxity, there is no pain with patella manipulation.  There is some pain with manipulation of the bruise on the lateral/anterior knee but no pain with motion of the knee itself.  She is ambulating at what she describes as baseline.  Can continue home pain regimen but do not see indication for imaging at this time.  Patient agrees with plan and return precautions were discussed     , DO [facility]

## 2020-01-13 NOTE — Assessment & Plan Note (Signed)
Patient consents pneumococcal vaccine

## 2020-01-13 NOTE — Assessment & Plan Note (Signed)
A1c well within goal at 6.0, no change at this time

## 2020-01-20 NOTE — Telephone Encounter (Signed)
Noted and agree. 

## 2020-01-27 ENCOUNTER — Ambulatory Visit (INDEPENDENT_AMBULATORY_CARE_PROVIDER_SITE_OTHER): Payer: Medicare Other | Admitting: Family Medicine

## 2020-01-27 ENCOUNTER — Encounter: Payer: Self-pay | Admitting: Family Medicine

## 2020-01-27 ENCOUNTER — Other Ambulatory Visit: Payer: Self-pay

## 2020-01-27 VITALS — BP 128/80 | HR 78 | Wt 317.0 lb

## 2020-01-27 DIAGNOSIS — M549 Dorsalgia, unspecified: Secondary | ICD-10-CM

## 2020-01-27 NOTE — Patient Instructions (Signed)
It was good to see you today.  Thank you for coming in.  I think you have Chronic Musculoskeletal Back Pain.  But I would like to rule out Compression Fracture with X-rays  You should be better in 2 weeks.  If you are not better by then or if you do have Compression fracture on X-ray, then we will call you and come back to see [place].  If everything returns normal we will follow up in.  Things to do to help you feel better continue to use Voltaren Gel, Gabapentin and Tramadol.   Be Well, Jovita Kussmaul MD

## 2020-01-27 NOTE — Progress Notes (Signed)
    SUBJECTIVE:   CHIEF COMPLAINT / HPI:  Back Pain  Brianna Wheeler is a 64 yo female with back pain.  Patient has long history of chronic back pain with Sciatica but indicates pain has worsened in last 3 weeks following fall.  Patient indicates she tripped over something in her room.  Denies any leg weakness or new numbness or tingling.  Denies any loss of bowel or bladder control and any IV drug use.  Indicates sciatic pain is well controlled and this feels different than that.  Indicates has pain in upper and lower midback.  Pain is worse on right lower back and bothers her the most when getting out of bed in the morning.  Patient had MRI done in 2019 and does not remember last time she had X-ray.  Was also told she is not a candidate for surgery.  Currently takes Tylenol, Tramadol, and Gabapentin 100 mg.  She also uses Voltaren gel.  Indicates she cannot tolerate higher dose of Gabapentin due to drowsiness, and cannot take NSAIDs.  She has previously done PT and it makes pain worse.  PERTINENT  PMH / PSH: Compression fracture Osteoarthritis Lumbar Back Pain with Radiculopathy on the right  OBJECTIVE:   BP 128/80   Pulse 78   Wt (!) 317 lb (143.8 kg)   SpO2 97%   BMI 44.21 kg/m    Physical Exam Constitutional:      General: She is not in acute distress. HENT:     Head: Normocephalic.  Cardiovascular:     Rate and Rhythm: Normal rate and regular rhythm.  Pulmonary:     Effort: Pulmonary effort is normal.     Breath sounds: Normal breath sounds.  Musculoskeletal:     Cervical back: No tenderness. Normal range of motion.     Thoracic back: Spasms and tenderness present. No swelling.     Lumbar back: Tenderness present. Decreased range of motion.     Comments: Lumbar ROM limited due to pain  Neurological:     General: No focal deficit present.     Mental Status: She is alert.     Motor: No weakness.     ASSESSMENT/PLAN:   Back pain Likely Acute  Chronic Musculoskeletal  Lumbar strain on top of underlying Chronic back pain.  Large amount of tenderness in Thoracic and Lumbar Paraspinal areas. Given fall and previous history of compression fracture, need to rule out Compression Fracture. - Ordered Throacic and Lumbar Spinal X-rays - Continue current medication regimen for pain - Follow-up in 2 weeks pending normal X-rays, will call patient if concerning findings on X-ray - Counseled patient on importance of avoiding falls and reducing clutter around house     Jovita Kussmaul, MD [facility]

## 2020-01-28 NOTE — Assessment & Plan Note (Signed)
Likely Acute  Chronic Musculoskeletal Lumbar strain on top of underlying Chronic back pain.  Large amount of tenderness in Thoracic and Lumbar Paraspinal areas. Given fall and previous history of compression fracture, need to rule out Compression Fracture. - Ordered Throacic and Lumbar Spinal X-rays - Continue current medication regimen for pain - Follow-up in 2 weeks pending normal X-rays, will call patient if concerning findings on X-ray - Counseled patient on importance of avoiding falls and reducing clutter around house

## 2020-02-01 ENCOUNTER — Ambulatory Visit
Admission: RE | Admit: 2020-02-01 | Discharge: 2020-02-01 | Disposition: A | Discharge status: Home / Self Care | Payer: Medicare Other | Source: Ambulatory Visit | Attending: Family Medicine | Admitting: Family Medicine

## 2020-02-01 ENCOUNTER — Other Ambulatory Visit: Payer: Self-pay | Admitting: Family Medicine

## 2020-02-01 DIAGNOSIS — M549 Dorsalgia, unspecified: Secondary | ICD-10-CM

## 2020-02-01 DIAGNOSIS — M40204 Unspecified kyphosis, thoracic region: Secondary | ICD-10-CM | POA: Diagnosis not present

## 2020-02-01 DIAGNOSIS — G9589 Other specified diseases of spinal cord: Secondary | ICD-10-CM | POA: Diagnosis not present

## 2020-02-01 DIAGNOSIS — M4854XA Collapsed vertebra, not elsewhere classified, thoracic region, initial encounter for fracture: Secondary | ICD-10-CM | POA: Diagnosis not present

## 2020-02-01 DIAGNOSIS — S32030A Wedge compression fracture of third lumbar vertebra, initial encounter for closed fracture: Secondary | ICD-10-CM | POA: Diagnosis not present

## 2020-02-01 DIAGNOSIS — S299XXA Unspecified injury of thorax, initial encounter: Secondary | ICD-10-CM | POA: Diagnosis not present

## 2020-02-02 ENCOUNTER — Telehealth: Payer: Self-pay | Admitting: Family Medicine

## 2020-02-02 NOTE — Telephone Encounter (Signed)
Called patient.  She is aware of the arthritis and the old L3 compression fx.  I informed her of the new (age uncertain) T11 fx.  Also told her that she will need further testing for the possible soft tissue mass seen.  Pain is OK now.  Reviewed bone densities which seem OK.  She has an appointment with me on 7/19.  We will discuss further at that time.

## 2020-02-08 ENCOUNTER — Other Ambulatory Visit: Payer: Self-pay

## 2020-02-08 ENCOUNTER — Encounter: Payer: Self-pay | Admitting: Family Medicine

## 2020-02-08 ENCOUNTER — Other Ambulatory Visit: Payer: Self-pay | Admitting: Family Medicine

## 2020-02-08 ENCOUNTER — Ambulatory Visit (INDEPENDENT_AMBULATORY_CARE_PROVIDER_SITE_OTHER): Payer: Medicare Other | Admitting: Family Medicine

## 2020-02-08 DIAGNOSIS — R222 Localized swelling, mass and lump, trunk: Secondary | ICD-10-CM

## 2020-02-08 DIAGNOSIS — M546 Pain in thoracic spine: Secondary | ICD-10-CM

## 2020-02-08 DIAGNOSIS — S22070D Wedge compression fracture of T9-T10 vertebra, subsequent encounter for fracture with routine healing: Secondary | ICD-10-CM

## 2020-02-08 DIAGNOSIS — M5416 Radiculopathy, lumbar region: Secondary | ICD-10-CM | POA: Diagnosis not present

## 2020-02-08 DIAGNOSIS — M5441 Lumbago with sciatica, right side: Secondary | ICD-10-CM

## 2020-02-08 MED ORDER — BACLOFEN 10 MG PO TABS: 10.0000 mg | ORAL_TABLET | Freq: Three times a day (TID) | ORAL | 0 refills | Status: DC | PRN

## 2020-02-08 NOTE — Progress Notes (Signed)
     SUBJECTIVE:   CHIEF COMPLAINT / HPI:   Brianna Wheeler (MRN: [id number]) is a 64 y.o. female with a history of lumbar back pain who presents for follow up after having an x-ray of her lumbar spine.  Lumbar back pain Patient states that her lumbar back pain is still present, especially bad with the rain today. She says her pain is constant and radiates down both of her legs. It is hard for her to walk long distances because of this pain. The pain is alleviated by laying on her sides and sitting "straight up." Her pain is aggravated with laying flat and bending down. She uses tylenol, voltaren gel, tramadol, and gabapentin for relief of symptoms.  PERTINENT  PMH / PSH: osteoarthritis, vertebral compression fracture, back surgery in 1992. Patient is keeping a lot of deceased relative's belongings in the home and expresses concern with moving all of the heavy furniture and potentially falling.  OBJECTIVE:   BP 132/76   Pulse 79   Ht 5\' 11"  (1.803 m)   Wt (!) 317 lb 6.4 oz (144 kg)   SpO2 96%   BMI 44.27 kg/m    PHYSICAL EXAM  GEN: well developed, well-nourished, in NAD EXT: Moves all extremities equally    ASSESSMENT/PLAN:   Back pain Lumbar spine x-ray demonstrated new compression fracture at T11 and left-sided mass. Patient was counseled that pain should go away within 6 weeks. Baclofen was prescribed to assist with spasms and pain. Patient was counseled on how mass is most likely benign but will get a CT of thoracic spine to assess for spinal mass. Discussed de-cluttering the home to reduce chances of falling and further fractures.   , Medical Student [facility]

## 2020-02-08 NOTE — Patient Instructions (Signed)
You have a new compression fracture in your mid back from the fall.  That pain should go away in 6 weeks or so. I ordered a CT scan of that area because of the possible mass they saw. I will with results.  I bet this turns out to be nothing.   Try the new muscle relaxer.  I hope it helps some.  This low back pain here to stay.  We will work together to try to make the best.   Please declutter your house.  It decreases your chance of falling.

## 2020-02-08 NOTE — Assessment & Plan Note (Signed)
Lumbar spine x-ray demonstrated new compression fracture at T11 and left-sided mass. Patient was counseled that pain should go away within 6 weeks. Baclofen was prescribed to assist with spasms and pain. Patient was counseled on how mass is most likely benign but will get a CT of thoracic spine to assess for spinal mass. Discussed de-cluttering the home to reduce chances of falling and further fractures.

## 2020-02-09 ENCOUNTER — Encounter: Payer: Self-pay | Admitting: Family Medicine

## 2020-02-09 DIAGNOSIS — R222 Localized swelling, mass and lump, trunk: Secondary | ICD-10-CM | POA: Insufficient documentation

## 2020-02-09 NOTE — Assessment & Plan Note (Signed)
Thoracic new, lumbar old.  Sx improving.  Given that both were high velocity and recent bone density normal, I will not assume and treat for osteoporosis.

## 2020-02-09 NOTE — Assessment & Plan Note (Signed)
Now bilateral.  Continue voltarin gel.  No current indication for advanced imaging.

## 2020-02-09 NOTE — Progress Notes (Signed)
Patient ID: Brianna Wheeler, female   DOB: 14-May-1956, 64 y.o.   MRN: [id number] To augment MS3 [name]'s note: 1. Back pain is most severe in the lumbar region.  She has bilateral radiculopathy.  No change in bowel or bladder.  Reviewing old x rays, she has known, severe DDD and facet arthropathy.   2. Thoracic compression fx.  Both this fx and previous lumbar fx were high velocity.  Normal bone density.  Thoracic pain is less that lower lumbar pain.  Thoracic pain has improved over last several weeks.  Lumbar pain is stable to worse. 3. Questionable thoracic soft tissue mass seen on T spine films.  Has symptoms of discomfort and "swelling" in that area.

## 2020-02-09 NOTE — Assessment & Plan Note (Signed)
Suspected on t spine films.  Will do advanced imaging to confirm and further define.

## 2020-02-18 ENCOUNTER — Ambulatory Visit
Admission: RE | Admit: 2020-02-18 | Discharge: 2020-02-18 | Disposition: A | Discharge status: Home / Self Care | Payer: Medicare Other | Source: Ambulatory Visit | Attending: Family Medicine | Admitting: Family Medicine

## 2020-02-18 ENCOUNTER — Other Ambulatory Visit: Payer: Self-pay

## 2020-02-18 DIAGNOSIS — M546 Pain in thoracic spine: Secondary | ICD-10-CM

## 2020-02-18 DIAGNOSIS — K449 Diaphragmatic hernia without obstruction or gangrene: Secondary | ICD-10-CM | POA: Diagnosis not present

## 2020-02-18 DIAGNOSIS — R937 Abnormal findings on diagnostic imaging of other parts of musculoskeletal system: Secondary | ICD-10-CM | POA: Diagnosis not present

## 2020-02-18 DIAGNOSIS — I700 Atherosclerosis of aorta: Secondary | ICD-10-CM | POA: Diagnosis not present

## 2020-02-18 DIAGNOSIS — M47814 Spondylosis without myelopathy or radiculopathy, thoracic region: Secondary | ICD-10-CM | POA: Diagnosis not present

## 2020-02-22 ENCOUNTER — Telehealth: Payer: Self-pay | Admitting: Family Medicine

## 2020-02-22 NOTE — Telephone Encounter (Signed)
-----   Message from Leighton Roach McDiarmid, MD sent at 02/22/2020 10:58 AM EDT -----  ----- Message ----- From: Interface, Rad Results In Sent: 02/19/2020   8:21 AM EDT To: Leighton Roach McDiarmid, MD

## 2020-02-22 NOTE — Telephone Encounter (Signed)
Called and gave CT scan results.  She was already aware that she had a hiatal hernia and considerable degenerative changes of her thoracic spine.

## 2020-03-10 ENCOUNTER — Telehealth: Payer: Self-pay

## 2020-03-10 NOTE — Telephone Encounter (Signed)
Alled and answered questions  OK to wear back brace for short periods  Do no use for long periods

## 2020-03-10 NOTE — Telephone Encounter (Signed)
Patient returns call to nurse line stating that she is returning missed phone call from provider. Last telephone encounter was from 8/2 regarding imaging results. Patient had further questions for provider regarding back brace. Will forward to PCP for further instruction.   Veronda Prude, RN

## 2020-03-29 ENCOUNTER — Other Ambulatory Visit: Payer: Self-pay | Admitting: Family Medicine

## 2020-03-31 ENCOUNTER — Ambulatory Visit: Payer: Medicare Other

## 2020-04-16 ENCOUNTER — Other Ambulatory Visit: Payer: Self-pay | Admitting: Family Medicine

## 2020-04-20 ENCOUNTER — Other Ambulatory Visit: Payer: Self-pay

## 2020-04-20 ENCOUNTER — Ambulatory Visit (INDEPENDENT_AMBULATORY_CARE_PROVIDER_SITE_OTHER): Payer: Medicare Other

## 2020-04-20 DIAGNOSIS — Z23 Encounter for immunization: Secondary | ICD-10-CM | POA: Diagnosis not present

## 2020-04-20 NOTE — Progress Notes (Signed)
° °  Covid-19 Vaccination Clinic  Name:  [name]    MRN: [id number] DOB: May 05, 1956  04/20/2020  Ms. Schubring was observed post Covid-19 immunization for 15 minutes without incident. She was provided with Vaccine Information Sheet and instruction to access the V-Safe system.   Ms. Wire was instructed to call 911 with any severe reactions post vaccine:  Difficulty breathing   Swelling of face and throat   A fast heartbeat   A bad rash all over body   Dizziness and weakness   #1 Covid Vaccine administered LD without complication.  #2 Covid Vaccine due 05/11/2020.  Flu administered RD without complication.

## 2020-05-12 ENCOUNTER — Other Ambulatory Visit: Payer: Self-pay

## 2020-05-12 ENCOUNTER — Ambulatory Visit (INDEPENDENT_AMBULATORY_CARE_PROVIDER_SITE_OTHER): Payer: Medicare Other

## 2020-05-12 DIAGNOSIS — Z23 Encounter for immunization: Secondary | ICD-10-CM | POA: Diagnosis not present

## 2020-05-12 NOTE — Progress Notes (Signed)
° °  Covid-19 Vaccination Clinic  Name:  Brianna Wheeler    MRN: [id number] DOB: 02/18/1956  05/12/2020  [name] was observed post Covid-19 immunization for 15 minutes without incident. She was provided with Vaccine Information Sheet and instruction to access the V-Safe system.   [name] was instructed to call 911 with any severe reactions post vaccine:  Difficulty breathing   Swelling of face and throat   A fast heartbeat   A bad rash all over body   Dizziness and weakness   #2 Covid Vaccine administered LD without complication.

## 2020-06-24 ENCOUNTER — Ambulatory Visit (INDEPENDENT_AMBULATORY_CARE_PROVIDER_SITE_OTHER): Payer: Medicare Other | Admitting: Student in an Organized Health Care Education/Training Program

## 2020-06-24 ENCOUNTER — Other Ambulatory Visit: Payer: Self-pay

## 2020-06-24 DIAGNOSIS — T7840XA Allergy, unspecified, initial encounter: Secondary | ICD-10-CM | POA: Diagnosis not present

## 2020-06-24 MED ORDER — IPRATROPIUM BROMIDE 0.03 % NA SOLN: 2.0000 | Freq: Two times a day (BID) | NASAL | 0 refills | Status: DC

## 2020-06-24 NOTE — Patient Instructions (Addendum)
It was a pleasure to see you today!  To summarize our discussion for this visit:  The good news is that I do not see signs of infection today. Your allergies look pretty flared up though.  Keep using your allergy medications and flonase. This can help decrease the swelling.  i'm prescribing a medication called atrovent which is a nasal spray that can significantly reduce your drainage but don't use for a long period of time as it can cause too much dryness.   I recommend nasal flushing as well with a netty pot or bulb syringe and a filtered water with seas salt and/or baking soda.  See [place] back if you're not seeing improvement in about a week.  Some additional health maintenance measures we should update are: Health Maintenance Due  Topic Date Due  . COLONOSCOPY  Never done  . OPHTHALMOLOGY EXAM  12/03/2018  . MAMMOGRAM  03/22/2019  . FOOT EXAM  03/26/2019  . LIPID PANEL  09/18/2019  . URINE MICROALBUMIN  01/28/2020  .     Call the clinic at [phone] if your symptoms worsen or you have any concerns.   Thank you for allowing me to take part in your care,  Dr. Jamelle Rushing
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
## 2020-06-26 DIAGNOSIS — T7840XA Allergy, unspecified, initial encounter: Secondary | ICD-10-CM | POA: Insufficient documentation

## 2020-06-26 NOTE — Assessment & Plan Note (Signed)
Patient has known trigger and having exacerbation of allergies that is partially treated with home OTC medications.  Exam is negative for signs of bacterial infection. - continue to avoid triggers - continue flonase and zyrtec daily - prescribe atrovent nasal spray x3 days to help clear drainage - recommended nasal flushing with saline  - follow up in 1 week if not improving

## 2020-06-27 ENCOUNTER — Telehealth: Payer: Self-pay

## 2020-06-27 NOTE — Telephone Encounter (Signed)
Patient calls nurse line to report nose bleeds after taking atrovent nasal spray. Patient states that nose bleeds occur about 30 minutes after administering nasal spray. Patient reports being able to stop nose bleeds with applying pressure.   Patient reports continued cough with post nasal drip.   Denies dizziness, lightheadedness or SHOB.  Patient has follow up appointment with PCP on 06/30/20.   Provided patient with strict ED precautions and advised patient to stop nasal spray. Please advise additional recommendations.   Veronda Prude, RN

## 2020-06-27 NOTE — Telephone Encounter (Signed)
Called.  Two issues:  Significant sinus congestion ongoing -  Nosebleed with atrovent nasal. She has stopped the atrovent nasal  Has appointment with me in three days. Recommended normal saline nose spray and a humidifier.

## 2020-06-30 ENCOUNTER — Ambulatory Visit (INDEPENDENT_AMBULATORY_CARE_PROVIDER_SITE_OTHER): Payer: Medicare Other | Admitting: Family Medicine

## 2020-06-30 ENCOUNTER — Other Ambulatory Visit: Payer: Self-pay | Admitting: Family Medicine

## 2020-06-30 ENCOUNTER — Encounter: Payer: Self-pay | Admitting: Family Medicine

## 2020-06-30 ENCOUNTER — Other Ambulatory Visit: Payer: Self-pay

## 2020-06-30 VITALS — BP 132/64 | HR 74 | Ht 71.0 in | Wt 310.0 lb

## 2020-06-30 DIAGNOSIS — E119 Type 2 diabetes mellitus without complications: Secondary | ICD-10-CM

## 2020-06-30 DIAGNOSIS — R7303 Prediabetes: Secondary | ICD-10-CM | POA: Diagnosis not present

## 2020-06-30 DIAGNOSIS — R222 Localized swelling, mass and lump, trunk: Secondary | ICD-10-CM

## 2020-06-30 DIAGNOSIS — J329 Chronic sinusitis, unspecified: Secondary | ICD-10-CM | POA: Diagnosis not present

## 2020-06-30 DIAGNOSIS — J4520 Mild intermittent asthma, uncomplicated: Secondary | ICD-10-CM | POA: Diagnosis not present

## 2020-06-30 LAB — POCT GLYCOSYLATED HEMOGLOBIN (HGB A1C): Hemoglobin A1C: 5.7 % — AB (ref 4.0–5.6)

## 2020-06-30 MED ORDER — BENZONATATE 100 MG PO CAPS: 100.0000 mg | ORAL_CAPSULE | Freq: Two times a day (BID) | ORAL | 0 refills | Status: DC | PRN

## 2020-06-30 MED ORDER — AMOXICILLIN-POT CLAVULANATE 875-125 MG PO TABS: 1.0000 | ORAL_TABLET | Freq: Two times a day (BID) | ORAL | 0 refills | Status: DC

## 2020-06-30 NOTE — Patient Instructions (Signed)
Any solution I have is imperfect. I will try the antibiotic and the cough medicine.  I hope it helps. Great job on the diabetes. I would like to see you back at your convenience to get you caught up on the various prevention things.  You are little behind.

## 2020-06-30 NOTE — Assessment & Plan Note (Signed)
With her weight loss and current A1C, I believe prediabetes is the most appropriate diagnosis.
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
## 2020-06-30 NOTE — Assessment & Plan Note (Signed)
Given two week duration, will Rx with antibiotics.  Cough meds.  She feels that her cough is all post nasal drip.

## 2020-06-30 NOTE — Assessment & Plan Note (Signed)
No evidence of wheezing.  All symptoms seem upper respiratory.

## 2020-07-01 LAB — MICROALBUMIN / CREATININE URINE RATIO
Creatinine, Urine: 206.5 mg/dL
Microalb/Creat Ratio: 4 mg/g creat (ref 0–29)
Microalbumin, Urine: 7.4 ug/mL

## 2020-08-20 ENCOUNTER — Other Ambulatory Visit: Payer: Self-pay | Admitting: Family Medicine

## 2020-08-20 DIAGNOSIS — M5441 Lumbago with sciatica, right side: Secondary | ICD-10-CM

## 2020-09-12 ENCOUNTER — Encounter: Payer: Self-pay | Admitting: Family Medicine

## 2020-09-12 ENCOUNTER — Other Ambulatory Visit: Payer: Self-pay

## 2020-09-12 ENCOUNTER — Ambulatory Visit (INDEPENDENT_AMBULATORY_CARE_PROVIDER_SITE_OTHER): Payer: Medicare Other | Admitting: Family Medicine

## 2020-09-12 DIAGNOSIS — I10 Essential (primary) hypertension: Secondary | ICD-10-CM

## 2020-09-12 DIAGNOSIS — M546 Pain in thoracic spine: Secondary | ICD-10-CM | POA: Diagnosis not present

## 2020-09-12 DIAGNOSIS — F4329 Adjustment disorder with other symptoms: Secondary | ICD-10-CM

## 2020-09-12 MED ORDER — HYDROCHLOROTHIAZIDE 25 MG PO TABS: 25.0000 mg | ORAL_TABLET | Freq: Every day | ORAL | 3 refills | Status: DC

## 2020-09-12 MED ORDER — 1ST MEDX-PATCH/ LIDOCAINE 4-0.025-5-20 % EX PTCH: 1.0000 | MEDICATED_PATCH | Freq: Every day | CUTANEOUS | 2 refills | Status: DC
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
## 2020-09-12 NOTE — Assessment & Plan Note (Signed)
Acute on chronic.  She is two weeks in and not improving.  Add lidocaine patch.  She has not been taking any baclofen.  Add lidocaine and baclofen.  If not improving, add PT.

## 2020-09-12 NOTE — Patient Instructions (Addendum)
Try some numbing medicine (lidocaine cream or patch.)  I will send in a prescription.   Take your baclofen (muscle relaxer) regularly to see if helps. Call me in one week if you are not improving.  I will want to get you set up for some physical therapy.  I want to do what I can to make sure this pain does not become chronic. See me in two weeks.  I want to make sure you are getting better both physically and emotionally.
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
## 2020-09-13 ENCOUNTER — Telehealth: Payer: Self-pay

## 2020-09-13 MED ORDER — LIDOCAINE 4 % EX PTCH: 1.0000 | MEDICATED_PATCH | Freq: Every day | CUTANEOUS | 1 refills | Status: DC

## 2020-09-13 NOTE — Telephone Encounter (Signed)
Fax received from [facility] with the following message:  We do not have a patch that has all the following ingredients.  Lido-Capsaicin-Men-Methyl Sal 58fst Medx-patch/Lidocaine) 4-0.025-5-20% patch.   What else would you like?  Please send new Rx.  Sunday Spillers, CMA

## 2020-09-13 NOTE — Telephone Encounter (Signed)
done

## 2020-09-21 ENCOUNTER — Ambulatory Visit (INDEPENDENT_AMBULATORY_CARE_PROVIDER_SITE_OTHER): Payer: Medicare Other | Admitting: Family Medicine

## 2020-09-21 ENCOUNTER — Other Ambulatory Visit: Payer: Self-pay

## 2020-09-21 ENCOUNTER — Encounter: Payer: Self-pay | Admitting: Family Medicine

## 2020-09-21 DIAGNOSIS — E785 Hyperlipidemia, unspecified: Secondary | ICD-10-CM | POA: Diagnosis not present

## 2020-09-21 DIAGNOSIS — Z1239 Encounter for other screening for malignant neoplasm of breast: Secondary | ICD-10-CM | POA: Insufficient documentation

## 2020-09-21 DIAGNOSIS — M546 Pain in thoracic spine: Secondary | ICD-10-CM | POA: Diagnosis not present

## 2020-09-21 DIAGNOSIS — F4329 Adjustment disorder with other symptoms: Secondary | ICD-10-CM
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
## 2020-09-21 NOTE — Assessment & Plan Note (Addendum)
Recheck lipids.  Called with results.  Patient states she has not been taking her pravastatin.  "Someone told me to stop it."  No problem with statins in the past.  Will start rosuvastatin and recheck direct LDL in 3 months.
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
## 2020-09-21 NOTE — Patient Instructions (Signed)
I am glad you are feeling better. Please get your mammogram Please get your eye exam and have them send me a copy of their results.   Next time, let me examine your feet. Next time, we will talk again about a colonoscopy

## 2020-09-21 NOTE — Assessment & Plan Note (Signed)
Ordered mammo 

## 2020-09-21 NOTE — Assessment & Plan Note (Signed)
Improving back to her baseline.  Acute is resolving.  Chronic is chronic.  Does not feel she needs PT at this point.

## 2020-09-22 ENCOUNTER — Telehealth: Payer: Self-pay | Admitting: Family Medicine

## 2020-09-22 LAB — LIPID PANEL
Chol/HDL Ratio: 5 ratio — ABNORMAL HIGH (ref 0.0–4.4)
Cholesterol, Total: 169 mg/dL (ref 100–199)
HDL: 34 mg/dL — ABNORMAL LOW (ref >39)
LDL Chol Calc (NIH): 117 mg/dL — ABNORMAL HIGH (ref 0–99)
Triglycerides: 99 mg/dL (ref 0–149)
VLDL Cholesterol Cal: 18 mg/dL (ref 5–40)

## 2020-09-22 MED ORDER — ROSUVASTATIN CALCIUM 20 MG PO TABS: 20.0000 mg | ORAL_TABLET | Freq: Every day | ORAL | 3 refills | Status: DC

## 2020-09-22 NOTE — Addendum Note (Signed)
Addended by: [name] on: 09/22/2020 02:50 PM   Modules accepted: Orders

## 2020-09-22 NOTE — Telephone Encounter (Signed)
I called and discussed labs.  Started on rosuvastatin.

## 2020-09-22 NOTE — Telephone Encounter (Signed)
Patient is returning a missed call. She said it she thinks it was concerning the results from her blood work. Patient would like for someone to call her back to discuss.   The best call back is her cell at [phone].

## 2020-09-26 NOTE — Telephone Encounter (Signed)
Patient calls nurse line regarding side effects after starting rosuvastatin. Patient reports taking first dose on 3/3 an began noticing side effects 15 minutes after. Reports headache and dizziness. Reports that when she attempted to stand after taking medication, she fell back onto the bed.    Patient contacted pharmacy that recommended she stop taking medication until she discussed with PCP. Patient is currently asymptomatic.   Please advise next steps for patient,   Brianna Prude, RN

## 2020-09-26 NOTE — Telephone Encounter (Signed)
Headache and dizzy immediately after taking one dose of rosuvastatin.  Stopped and non since.  I doubt this was medicine related.  She will try again.  She had no sx of an allergic reaction.  If this was med related, it was an intollerance.

## 2020-10-27 ENCOUNTER — Ambulatory Visit (INDEPENDENT_AMBULATORY_CARE_PROVIDER_SITE_OTHER): Payer: Medicare Other | Admitting: Family Medicine

## 2020-10-27 ENCOUNTER — Other Ambulatory Visit: Payer: Self-pay

## 2020-10-27 DIAGNOSIS — B9789 Other viral agents as the cause of diseases classified elsewhere: Secondary | ICD-10-CM | POA: Diagnosis not present

## 2020-10-27 DIAGNOSIS — J329 Chronic sinusitis, unspecified: Secondary | ICD-10-CM

## 2020-10-27 NOTE — Patient Instructions (Signed)
Sinusitis: It sounds like you and your son most likely are experiencing a viral sinusitis.  At this point, I recommend that you continue taking your allergy medication but it is not necessary to add on any new medication.  If you continue to have these symptoms on Monday, feel free to send me a message and we can send you on antibiotics.

## 2020-10-27 NOTE — Progress Notes (Signed)
    SUBJECTIVE:   CHIEF COMPLAINT / HPI:   Viral sinusitis Ms. Foulk presents to clinic today with her son with concerns about a sinus symptoms.  She reports that she has been experiencing significant nasal congestion in addition to postnasal drip and a mild cough since Thursday of last week.  The seem to happen after a windy, very pollen needed today.  In addition to the forementioned symptoms, she is also experiencing mild fullness below her eyes.  She specifically denies fever, sore throat, shortness of breath.  She is producing some yellow sputum.  She denies chest pain.  She has previously received 2 vaccinations against Covid, the last of which was about 6 months ago.  PERTINENT  PMH / PSH: Asthma, allergic rhinitis  OBJECTIVE:   BP 121/60   Pulse 74   Temp 98.2 F (36.8 C)   Ht 5\' 11"  (1.803 m)   SpO2 97%   BMI 44.05 kg/m    General: Alert and cooperative and appears to be in no acute distress HEENT: Mild tenderness over the maxillary sinuses.  Moist mucous membranes.  Normal tympanic membranes visualized bilaterally. Cardio: Normal S1 and S2, no S3 or S4. Rhythm is regular. No murmurs or rubs.   Pulm: Clear to auscultation bilaterally, no crackles, wheezing, or diminished breath sounds. Normal respiratory effort Abdomen: Bowel sounds normal. Abdomen soft and non-tender.  Extremities: No peripheral edema. Warm/ well perfused.  Strong radial pulses. Neuro: Cranial nerves grossly intact  ASSESSMENT/PLAN:   Viral sinusitis History and physical most consistent with a viral sinusitis.  Low suspicion for true sinusitis based on lack of purulent discharge and time course so far.  We discussed the possibility of Covid infection and they were offered Covid testing which they declined.  No antibiotics needed at this time.  They are encouraged to continue using over-the-counter medicine and her typical allergy regimen.  They are also encouraged to call back or message me at the clinic  if their symptoms persisted through Monday and we could consider antibiotic treatment at that time     Friday, MD [facility]

## 2020-10-27 NOTE — Assessment & Plan Note (Signed)
History and physical most consistent with a viral sinusitis.  Low suspicion for true sinusitis based on lack of purulent discharge and time course so far.  We discussed the possibility of Covid infection and they were offered Covid testing which they declined.  No antibiotics needed at this time.  They are encouraged to continue using over-the-counter medicine and her typical allergy regimen.  They are also encouraged to call back or message me at the clinic if their symptoms persisted through Monday and we could consider antibiotic treatment at that time 

## 2020-10-31 ENCOUNTER — Telehealth: Payer: Self-pay

## 2020-10-31 NOTE — Telephone Encounter (Signed)
Patient LVM on nurse line regarding persistence of nasal congestion, runny nose and cough. Patient states she was told to return call to office if symptoms have not improved for initiation of antibiotic. If appropriate, please send to [facility] on [address].

## 2020-11-01 ENCOUNTER — Other Ambulatory Visit: Payer: Self-pay | Admitting: Family Medicine

## 2020-11-01 MED ORDER — AMOXICILLIN-POT CLAVULANATE 875-125 MG PO TABS
1.0000 | ORAL_TABLET | Freq: Two times a day (BID) | ORAL | 0 refills | Status: AC
Start: 2020-11-01 — End: 2020-11-08

## 2020-11-01 NOTE — Progress Notes (Signed)
Ms. Talerico called and informed that I have sent in a 7-day course of Augmentin for sinusitis.

## 2020-11-01 NOTE — Telephone Encounter (Signed)
Patient returns call to nurse line regarding below request. Please advise.   Veronda Prude, RN

## 2020-11-15 ENCOUNTER — Ambulatory Visit: Payer: Medicare Other | Admitting: Family Medicine

## 2020-11-15 NOTE — Progress Notes (Deleted)
   Subjective:   Patient ID: Brianna Wheeler    DOB: [birth year], 65 y.o. female   MRN: [id number]  Brianna Wheeler is a 65 y.o. female with a history of HTN, allergic rhinitis, asthma, chronic sinusitis, diaphragmatic hernia, GERD, hypothyroidism, lumbar radicular back pain, osteoarthritis, vertebral compression fracture, cataract, depression, edema, HLD, headache, mass of thoracic structure, metatarsalgia bilateral, nocturia, prediabetes, serum gammaglobulin increased, transaminitis here for LE swelling  LE swelling    Had work up for swelling in July 2020. UA negative for protein, TSH elevated. Normal CBC, BNP, CMP. A1C 6.1. Did have abnormal electrophoresis with presence of M-spike, unclear significnace. She was referred to heme/once. No records in chart.   Was seen for left leg swelling in 2015 thought to be venous insufficiency vs swelling from past ankle injuries.   Review of Systems:  Per HPI.   Objective:   There were no vitals taken for this visit. Vitals and nursing note reviewed.  General: pleasant ***, sitting comfortably in exam chair, well nourished, well developed, in no acute distress with non-toxic appearance HEENT: normocephalic, atraumatic, moist mucous membranes, oropharynx clear without erythema or exudate, TM normal bilaterally  Neck: supple, non-tender without lymphadenopathy CV: regular rate and rhythm without murmurs, rubs, or gallops, no lower extremity edema, 2+ radial and pedal pulses bilaterally Lungs: clear to auscultation bilaterally with normal work of breathing on room air Resp: breathing comfortably on room air, speaking in full sentences Abdomen: soft, non-tender, non-distended, no masses or organomegaly palpable, normoactive bowel sounds Skin: warm, dry, no rashes or lesions Extremities: warm and well perfused, normal tone MSK: ROM grossly intact, strength intact, gait normal Neuro: Alert and oriented, speech normal  Assessment & Plan:   No  problem-specific Assessment & Plan notes found for this encounter.  No orders of the defined types were placed in this encounter.  No orders of the defined types were placed in this encounter.   {    This will disappear when note is signed, click to select method of visit    :1}  Orpah Cobb, DO PGY-3, [facility] 11/15/2020 7:43 AM

## 2020-12-08 ENCOUNTER — Ambulatory Visit (INDEPENDENT_AMBULATORY_CARE_PROVIDER_SITE_OTHER): Payer: Medicare Other | Admitting: Family Medicine

## 2020-12-08 ENCOUNTER — Other Ambulatory Visit: Payer: Self-pay

## 2020-12-08 ENCOUNTER — Encounter: Payer: Self-pay | Admitting: Family Medicine

## 2020-12-08 ENCOUNTER — Ambulatory Visit (INDEPENDENT_AMBULATORY_CARE_PROVIDER_SITE_OTHER): Payer: Medicare Other

## 2020-12-08 DIAGNOSIS — Z23 Encounter for immunization: Secondary | ICD-10-CM

## 2020-12-08 DIAGNOSIS — M546 Pain in thoracic spine: Secondary | ICD-10-CM

## 2020-12-08 DIAGNOSIS — Y92009 Unspecified place in unspecified non-institutional (private) residence as the place of occurrence of the external cause: Secondary | ICD-10-CM | POA: Diagnosis not present

## 2020-12-08 DIAGNOSIS — M159 Polyosteoarthritis, unspecified: Secondary | ICD-10-CM

## 2020-12-08 DIAGNOSIS — W19XXXA Unspecified fall, initial encounter: Secondary | ICD-10-CM | POA: Diagnosis not present

## 2020-12-08 DIAGNOSIS — M8949 Other hypertrophic osteoarthropathy, multiple sites: Secondary | ICD-10-CM | POA: Diagnosis not present

## 2020-12-08 NOTE — Assessment & Plan Note (Signed)
Acute on chronic pain.

## 2020-12-08 NOTE — Assessment & Plan Note (Signed)
Knees bilateral acute on chronic pain.  Offered PT.  Patient defers for now since recovering on own.
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
## 2020-12-08 NOTE — Assessment & Plan Note (Signed)
Patient aware that long term weight loss is best way to maintain mobility, decrease fall risk and decrease osteoarthritis pain.

## 2020-12-08 NOTE — Patient Instructions (Addendum)
I am sorry you fell.  Luckily, you did not cause severe damage.  Of course, you did aggravate your arthritis.   You seem to be getting better on your own.  For now, I will hold off on ordering physical therapy.  Call me if your progress stalls and I will put the physical therapy order in. Keep thinking about ways to make your house safer.  Keep fighting hard to stay fit, strong and lose weight.

## 2020-12-08 NOTE — Assessment & Plan Note (Signed)
Work on making home safer.

## 2020-12-26 ENCOUNTER — Other Ambulatory Visit: Payer: Self-pay | Admitting: *Deleted

## 2020-12-26 MED ORDER — ALBUTEROL SULFATE HFA 108 (90 BASE) MCG/ACT IN AERS: 2.0000 | INHALATION_SPRAY | Freq: Four times a day (QID) | RESPIRATORY_TRACT | 3 refills | Status: DC | PRN

## 2021-01-14 ENCOUNTER — Other Ambulatory Visit: Payer: Self-pay

## 2021-01-14 ENCOUNTER — Ambulatory Visit
Admission: RE | Admit: 2021-01-14 | Discharge: 2021-01-14 | Disposition: A | Discharge status: Home / Self Care | Payer: Medicare Other | Source: Ambulatory Visit | Attending: Family Medicine | Admitting: Family Medicine

## 2021-01-14 DIAGNOSIS — Z1239 Encounter for other screening for malignant neoplasm of breast: Secondary | ICD-10-CM

## 2021-01-14 DIAGNOSIS — Z1231 Encounter for screening mammogram for malignant neoplasm of breast: Secondary | ICD-10-CM | POA: Diagnosis not present

## 2021-02-10 ENCOUNTER — Ambulatory Visit: Payer: Medicare Other | Admitting: Student
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
## 2021-04-03 ENCOUNTER — Other Ambulatory Visit: Payer: Self-pay

## 2021-04-03 ENCOUNTER — Ambulatory Visit (INDEPENDENT_AMBULATORY_CARE_PROVIDER_SITE_OTHER): Payer: Medicare Other | Admitting: Family Medicine

## 2021-04-03 VITALS — BP 134/54 | HR 78 | Ht 71.0 in | Wt 307.6 lb

## 2021-04-03 DIAGNOSIS — Z23 Encounter for immunization: Secondary | ICD-10-CM

## 2021-04-03 DIAGNOSIS — W19XXXD Unspecified fall, subsequent encounter: Secondary | ICD-10-CM

## 2021-04-03 DIAGNOSIS — Y92009 Unspecified place in unspecified non-institutional (private) residence as the place of occurrence of the external cause: Secondary | ICD-10-CM | POA: Diagnosis not present

## 2021-04-03 DIAGNOSIS — Z008 Encounter for other general examination: Secondary | ICD-10-CM | POA: Diagnosis not present

## 2021-04-03 NOTE — Patient Instructions (Signed)
I don't think you broke anything, just bruised.  I am also worried about your lack of confidence - you worry about falling.  I think a good walker is the best way to get you moving again.  Once you fill the prescription, come and see me with the walker to make sure you are using it right.

## 2021-04-05 ENCOUNTER — Encounter: Payer: Self-pay | Admitting: Family Medicine

## 2021-04-05 NOTE — Progress Notes (Signed)
    SUBJECTIVE:   CHIEF COMPLAINT / HPI:   Patient fell out of bed.  Unclear why.  "Maybe I was having a bad dream."  Bruised both shins and agrevated her chronic back pain."  She has chronic gait unsteadiness.  She does not feel confident in walking/moving and finds herself doing less and less.  Did well with her husband's old walker, but it is now broken. Son helps her but is not always around.  Does not want to go to physical therpay.  She aches all over from the fall.  Other than generally her knees and back, does not point to any specific area of injury   OBJECTIVE:   BP (!) 134/54   Pulse 78   Ht 5\' 11"  (1.803 m)   Wt (!) 307 lb 9.6 oz (139.5 kg)   SpO2 97%   BMI 42.90 kg/m   General tenderness of low back and both knees.  No bony tenderness.  Gait is slow and cautious.  Otherwise normal.    ASSESSMENT/PLAN:   No problem-specific Assessment & Plan notes found for this encounter.     , MD [facility]

## 2021-04-05 NOTE — Assessment & Plan Note (Signed)
Another fall at home.  I am concerned that she is limiting activity which will cause more problems in the long run.  We discussed options at length.  Hand wrote prescription for a rolling walker.  She will fill prescription and return with walker so that I can help her use it properly.  She needs it more for balance than to offload weight from either leg.

## 2021-04-06 ENCOUNTER — Other Ambulatory Visit: Payer: Self-pay | Admitting: Family Medicine

## 2021-04-14 ENCOUNTER — Other Ambulatory Visit: Payer: Self-pay

## 2021-04-14 DIAGNOSIS — M5441 Lumbago with sciatica, right side: Secondary | ICD-10-CM

## 2021-04-17 MED ORDER — TRAMADOL HCL 50 MG PO TABS: 50.0000 mg | ORAL_TABLET | Freq: Three times a day (TID) | ORAL | 3 refills | Status: DC | PRN

## 2021-04-26 ENCOUNTER — Encounter: Payer: Self-pay | Admitting: Family Medicine

## 2021-04-26 ENCOUNTER — Other Ambulatory Visit: Payer: Self-pay

## 2021-04-26 ENCOUNTER — Ambulatory Visit (INDEPENDENT_AMBULATORY_CARE_PROVIDER_SITE_OTHER): Payer: Medicare Other | Admitting: Family Medicine

## 2021-04-26 DIAGNOSIS — M159 Polyosteoarthritis, unspecified: Secondary | ICD-10-CM

## 2021-04-26 DIAGNOSIS — M150 Primary generalized (osteo)arthritis: Secondary | ICD-10-CM

## 2021-04-26 NOTE — Progress Notes (Signed)
    SUBJECTIVE:   CHIEF COMPLAINT / HPI:   FU fall and gait abnormality.  See last visit of 9/12.  Comes in with rolling walker.  She is well pleased.  States she has not fallen since last visit.  She feels more confident with the walker and is starting to be more active.    Still, she cannot do much.  Still does not want PT.    OBJECTIVE:   BP 107/71   Pulse 69   Ht 5\' 11"  (1.803 m)   Wt (!) 312 lb 12.8 oz (141.9 kg)   SpO2 98%   BMI 43.63 kg/m   Note weight gain I watched her use the walker effectively.  She is pleased and committed to increasing her activity.  ASSESSMENT/PLAN:   Osteoarthritis The walker seems to be a good intervention which is helping her be more active.  She has plenty of work ahead of her.  Morbid obesity (HCC) We talked about weight gain.  Wt loss is very important to her maintaining her long term mobility.       , MD [facility]

## 2021-04-26 NOTE — Assessment & Plan Note (Signed)
We talked about weight gain.  Wt loss is very important to her maintaining her long term mobility.

## 2021-04-26 NOTE — Assessment & Plan Note (Signed)
The walker seems to be a good intervention which is helping her be more active.  She has plenty of work ahead of her.

## 2021-04-26 NOTE — Patient Instructions (Signed)
I am glad the walker is working out for you.  Use the walker to become more active.  Get back to losing weight like you before the fall.   See me again in 2-3 months to check that things are going well.

## 2021-04-27 ENCOUNTER — Telehealth: Payer: Self-pay

## 2021-04-27 MED ORDER — DICLOFENAC SODIUM 1 % EX CREA: TOPICAL_CREAM | CUTANEOUS | 6 refills | Status: DC

## 2021-04-27 MED ORDER — DICLOFENAC SODIUM 1 % EX GEL: 2.0000 g | Freq: Four times a day (QID) | CUTANEOUS | 6 refills | Status: DC

## 2021-04-27 NOTE — Telephone Encounter (Signed)
Pharmacy does not have the cream ( also pt prefers the gel)  Can the gel be sent instead?  Jone Baseman, CMA

## 2021-04-27 NOTE — Telephone Encounter (Signed)
Rx sent as requested.

## 2021-04-27 NOTE — Telephone Encounter (Signed)
Patient calls nurse line regarding request for rx for Voltaren gel. Patient reports working out in the yard and that she has aggravated her arthritis.   Please advise if refill can be sent to her pharmacy.   Brianna Wheeler

## 2021-04-27 NOTE — Addendum Note (Signed)
Addended by: [name] on: 04/27/2021 05:07 PM   Modules accepted: Orders

## 2021-04-27 NOTE — Telephone Encounter (Signed)
Pt informed. Karrington Studnicka T Mckinnley Smithey, CMA  

## 2021-05-22 ENCOUNTER — Telehealth: Payer: Self-pay

## 2021-05-22 ENCOUNTER — Other Ambulatory Visit: Payer: Self-pay | Admitting: Family Medicine

## 2021-05-22 DIAGNOSIS — M5441 Lumbago with sciatica, right side: Secondary | ICD-10-CM

## 2021-05-22 NOTE — Telephone Encounter (Signed)
Pt calling to let Dr. Leveda Anna know that the pain going down her leg and into her back is gotten bad. It comes and goes but when it hits she is afraid to walk for fear of falling. Has a hard time moving legs / putting weight on them Is this a normal issue with her injury or is there something else she should do. Please give pt a call. I have scheduled an appointment for 11/16, the first available appointment. Sunday Spillers, CMA

## 2021-05-23 NOTE — Telephone Encounter (Signed)
Called.  Back pain waxes and wanes.  It had gotten bad "where I couldn't get out of bed" last week.  Has improved some since.  She will keep her appointment with me on 11/16.  She will call back if worsens.

## 2021-05-25 ENCOUNTER — Other Ambulatory Visit: Payer: Self-pay | Admitting: Family Medicine

## 2021-05-25 DIAGNOSIS — I10 Essential (primary) hypertension: Secondary | ICD-10-CM

## 2021-06-03 ENCOUNTER — Other Ambulatory Visit: Payer: Self-pay | Admitting: Family Medicine

## 2021-06-07 ENCOUNTER — Ambulatory Visit: Payer: Medicare Other | Admitting: Family Medicine

## 2021-06-16 ENCOUNTER — Other Ambulatory Visit: Payer: Self-pay | Admitting: Family Medicine

## 2021-06-16 DIAGNOSIS — I10 Essential (primary) hypertension: Secondary | ICD-10-CM

## 2021-06-26 ENCOUNTER — Ambulatory Visit: Payer: Medicare Other | Admitting: Family Medicine

## 2021-06-30 ENCOUNTER — Other Ambulatory Visit: Payer: Self-pay | Admitting: Family Medicine

## 2021-06-30 DIAGNOSIS — I10 Essential (primary) hypertension: Secondary | ICD-10-CM

## 2021-07-12 ENCOUNTER — Ambulatory Visit: Payer: Medicare Other

## 2021-07-27 ENCOUNTER — Encounter: Payer: Self-pay | Admitting: Family Medicine

## 2021-07-27 ENCOUNTER — Ambulatory Visit (INDEPENDENT_AMBULATORY_CARE_PROVIDER_SITE_OTHER): Payer: Medicare Other | Admitting: Family Medicine

## 2021-07-27 ENCOUNTER — Other Ambulatory Visit: Payer: Self-pay

## 2021-07-27 VITALS — BP 137/68 | HR 72 | Ht 71.0 in | Wt 301.6 lb

## 2021-07-27 DIAGNOSIS — E785 Hyperlipidemia, unspecified: Secondary | ICD-10-CM

## 2021-07-27 DIAGNOSIS — I10 Essential (primary) hypertension: Secondary | ICD-10-CM

## 2021-07-27 DIAGNOSIS — R399 Unspecified symptoms and signs involving the genitourinary system: Secondary | ICD-10-CM

## 2021-07-27 DIAGNOSIS — E039 Hypothyroidism, unspecified: Secondary | ICD-10-CM

## 2021-07-27 DIAGNOSIS — N3000 Acute cystitis without hematuria: Secondary | ICD-10-CM | POA: Diagnosis not present

## 2021-07-27 LAB — POCT URINALYSIS DIP (MANUAL ENTRY)
Glucose, UA: NEGATIVE mg/dL
Ketones, POC UA: NEGATIVE mg/dL
Nitrite, UA: POSITIVE — AB
Protein Ur, POC: NEGATIVE mg/dL
Spec Grav, UA: 1.02 (ref 1.010–1.025)
Urobilinogen, UA: 4 E.U./dL — AB
pH, UA: 6 (ref 5.0–8.0)

## 2021-07-27 MED ORDER — CEPHALEXIN 500 MG PO CAPS: 500.0000 mg | ORAL_CAPSULE | Freq: Four times a day (QID) | ORAL | 0 refills | Status: AC

## 2021-07-27 NOTE — Patient Instructions (Signed)
Thank you for coming to see me today. It was a pleasure.   We will get some labs today.  If they are abnormal or we need to do something about them, I will call you.  If they are normal, I will send you a message on MyChart (if it is active) or a letter in the mail.  If you don't hear from [place] in 2 weeks, please call the office at the number below.   Start Keflex 500 mg four times a day for 5 days  If you develop fevers>100.5, shortness of breath, chest pain, palpitations, dizziness, abdominal pain, nausea, vomiting, diarrhea or cannot eat or drink then please go to the ER immediately.   Please follow-up with PCP as needed  If you have any questions or concerns, please do not hesitate to call the office at [phone].  Best,   Dana Allan, MD

## 2021-07-27 NOTE — Progress Notes (Signed)
° ° °  SUBJECTIVE:   CHIEF COMPLAINT / HPI: urine infection  Reports urinary frequency for 3 weeks.  Denies any fevers, back pain, abdominal pain. Burning sensation when voiding.  Denies any vaginal discharge or bleeding.  PERTINENT  PMH / PSH:  HTN HLD Hypothyroid   OBJECTIVE:   BP 137/68    Pulse 72    Ht 5\' 11"  (1.803 m)    Wt (!) 301 lb 9.6 oz (136.8 kg)    SpO2 98%    BMI 42.06 kg/m    General: Alert, no acute distress Cardio: Normal S1 and S2, RRR, no r/m/g Pulm: CTAB, normal work of breathing Abdomen: Bowel sounds normal. Abdomen soft and non-tender.    ASSESSMENT/PLAN:   UTI (urinary tract infection) Symptomatic UTI -Urine positive for nitrates/Leuks -Keflex 500 mg QID x 7 days -Unable to send urine culture as not enough sample -May need to send culture if recurs -Follow up with PCP as needed  Hypothyroidism Last TSH 4.880 (2020) Repeat TSH today If remains elevated may need to adjust Synthroid Follow up with PCP  HYPERTENSION, BENIGN SYSTEMIC Normotensive BMet today Continue current medications Follow up with PCP  HLD (hyperlipidemia) Last LDL 117,goal < 100.   Repeat lipids today Continue statin Follow up with PCP   Healthcare Maintenance Recommend Shingles, PNA, COVID vaccinations Cancer screening due Dexa scan due Will discuss with patient at next visit Follow up with PCP for ongoing HCM  02-23[birth year]1983, MD [facility]

## 2021-07-28 ENCOUNTER — Telehealth: Payer: Self-pay | Admitting: Family Medicine

## 2021-07-28 ENCOUNTER — Encounter: Payer: Self-pay | Admitting: Family Medicine

## 2021-07-28 DIAGNOSIS — E876 Hypokalemia: Secondary | ICD-10-CM | POA: Insufficient documentation

## 2021-07-28 HISTORY — DX: Hypokalemia: E87.6

## 2021-07-28 LAB — BASIC METABOLIC PANEL
BUN/Creatinine Ratio: 10 — ABNORMAL LOW (ref 12–28)
BUN: 9 mg/dL (ref 8–27)
CO2: 27 mmol/L (ref 20–29)
Calcium: 8.9 mg/dL (ref 8.7–10.3)
Chloride: 99 mmol/L (ref 96–106)
Creatinine, Ser: 0.92 mg/dL (ref 0.57–1.00)
Glucose: 109 mg/dL — ABNORMAL HIGH (ref 70–99)
Potassium: 3.4 mmol/L — ABNORMAL LOW (ref 3.5–5.2)
Sodium: 136 mmol/L (ref 134–144)
eGFR: 68 mL/min/{1.73_m2} (ref >59)

## 2021-07-28 LAB — TSH: TSH: 4.77 u[IU]/mL — ABNORMAL HIGH (ref 0.450–4.500)

## 2021-07-28 LAB — LIPID PANEL
Chol/HDL Ratio: 4.4 ratio (ref 0.0–4.4)
Cholesterol, Total: 146 mg/dL (ref 100–199)
HDL: 33 mg/dL — ABNORMAL LOW (ref >39)
LDL Chol Calc (NIH): 97 mg/dL (ref 0–99)
Triglycerides: 82 mg/dL (ref 0–149)
VLDL Cholesterol Cal: 16 mg/dL (ref 5–40)

## 2021-07-28 MED ORDER — POTASSIUM CHLORIDE CRYS ER 10 MEQ PO TBCR: 10.0000 meq | EXTENDED_RELEASE_TABLET | Freq: Every day | ORAL | 3 refills | Status: DC

## 2021-07-28 NOTE — Telephone Encounter (Signed)
Called.  Will treat hypokalemia.  Rx sent

## 2021-08-02 ENCOUNTER — Encounter: Payer: Self-pay | Admitting: Family Medicine

## 2021-08-02 ENCOUNTER — Ambulatory Visit (INDEPENDENT_AMBULATORY_CARE_PROVIDER_SITE_OTHER): Payer: Medicare Other

## 2021-08-02 ENCOUNTER — Other Ambulatory Visit: Payer: Self-pay

## 2021-08-02 DIAGNOSIS — Z0000 Encounter for general adult medical examination without abnormal findings: Secondary | ICD-10-CM

## 2021-08-02 NOTE — Assessment & Plan Note (Signed)
Symptomatic UTI -Urine positive for nitrates/Leuks -Keflex 500 mg QID x 7 days -Unable to send urine culture as not enough sample -May need to send culture if recurs -Follow up with PCP as needed

## 2021-08-02 NOTE — Assessment & Plan Note (Signed)
Last TSH 4.880 (2020) Repeat TSH today If remains elevated may need to adjust Synthroid Follow up with PCP

## 2021-08-02 NOTE — Assessment & Plan Note (Addendum)
Normotensive BMet today Continue current medications Follow up with PCP

## 2021-08-02 NOTE — Assessment & Plan Note (Signed)
Last LDL 117,goal < 100.   Repeat lipids today Continue statin Follow up with PCP

## 2021-08-02 NOTE — Progress Notes (Signed)
Subjective:   Brianna Wheeler is a 66 y.o. female who presents for Medicare Annual (Subsequent) preventive examination.  Patient consented to have virtual visit and was identified by name and date of birth. Method of visit: Telephone  Encounter participants: Patient: Brianna Wheeler - located at Home Nurse/Provider: Steva Colder - located at [facility] Others (if applicable): NA  Review of Systems: Defer to PCP  Cardiac Risk Factors include: advanced age (>66men, >51 women);obesity (BMI >30kg/m2);sedentary lifestyle;hypertension  Objective:   Vitals: There were no vitals taken for this visit.  There is no height or weight on file to calculate BMI.  Advanced Directives 08/02/2021 12/08/2020 09/12/2020 02/08/2020 12/11/2019 05/27/2018 05/14/2018  Does Patient Have a Medical Advance Directive? No No No No No No No  Does patient want to make changes to medical advance directive? - - - - - - -  Would patient like information on creating a medical advance directive? Yes (MAU/Ambulatory/Procedural Areas - Information given) No - Patient declined No - Patient declined No - Patient declined No - Patient declined No - Patient declined No - Patient declined   Tobacco Social History   Tobacco Use  Smoking Status Never  Smokeless Tobacco Never     Clinical Intake:  Pre-visit preparation completed: Yes  Pain Score: 8   How often do you need to have someone help you when you read instructions, pamphlets, or other written materials from your doctor or pharmacy?: 2 - Rarely What is the last grade level you completed in school?: High School  Interpreter Needed?: No  Past Medical History:  Diagnosis Date   Diabetes mellitus    Hyperlipidemia    Hypertension    Left foot pain 2005   Chronic injury -- multiple broken bones and torn ligaments after accident in garage   Past Surgical History:  Procedure Laterality Date   EYE SURGERY     LUMBAR DISC SURGERY     TUBAL LIGATION     Family  History  Problem Relation Age of Onset   Cancer Sister        brain   Diabetes Sister    Heart disease Sister    Heart attack Sister    Epilepsy Son    Social History   Socioeconomic History   Marital status: Widowed    Spouse name: Not on file   Number of children: 1   Years of education: 11.5   Highest education level: 11th grade  Occupational History   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    Son suffers from [place]. [facility] patient- Kadience Macchi.)   Patient has her own vehicle and still drives .   Hobbies: cleaning house, likes home and garden things. Spending time with friends.      Social Determinants of Health   Financial Resource Strain: Low Risk    Difficulty of Paying Living Expenses: Not hard at all  Food Insecurity: No Food Insecurity   Worried About Programme researcher, broadcasting/film/video in the Last Year: Never true   Ran Out of Food in the Last Year: Never true  Transportation  Needs: No Transportation Needs   Lack of Transportation (Medical): No   Lack of Transportation (Non-Medical): No  Physical Activity: Inactive   Days of Exercise per Week: 0 days   Minutes of Exercise per Session: 0 min  Stress: No Stress Concern Present   Feeling of Stress : Not at all  Social Connections: Moderately Isolated   Frequency of Communication with Friends and Family: More than three times a week   Frequency of Social Gatherings with Friends and Family: Twice a week   Attends Religious Services: More than 4 times per year   Active Member of [employer] or Organizations: No   Attends BankerClub or Organization Meetings: Never   Marital Status: Widowed   Outpatient  Encounter Medications as of 08/02/2021  Medication Sig   albuterol (VENTOLIN HFA) 108 (90 Base) MCG/ACT inhaler Inhale 2 puffs into the lungs every 6 (six) hours as needed for wheezing or shortness of breath.   aspirin 81 MG EC tablet Take 81 mg by mouth daily.     baclofen (LIORESAL) 10 MG tablet Take 1 tablet (10 mg total) by mouth 3 (three) times daily as needed for muscle spasms.   Calcium Carb-Cholecalciferol (CALCIUM 1000 + D PO) Take by mouth.   diclofenac Sodium (VOLTAREN) 1 % GEL Apply 2 g topically 4 (four) times daily.   fexofenadine (ALLEGRA) 180 MG tablet Take 180 mg by mouth daily.     fluticasone (FLONASE) 50 MCG/ACT nasal spray Place 2 sprays into both nostrils daily. Place 2 sprays in each nostril daily   gabapentin (NEURONTIN) 100 MG capsule Take 1 capsule (100 mg total) by mouth 3 (three) times daily.   hydrochlorothiazide (HYDRODIURIL) 25 MG tablet Take 1 tablet by mouth once daily   levothyroxine (SYNTHROID) 88 MCG tablet Take 1 tablet by mouth once daily   Lidocaine (HM LIDOCAINE PATCH) 4 % PTCH Apply 1 patch topically daily.   Multiple Vitamin (MULTIVITAMIN) tablet Take 1 tablet by mouth daily.   omeprazole (PRILOSEC) 20 MG capsule Take 1 capsule by mouth once daily   potassium chloride (KLOR-CON M) 10 MEQ tablet Take 1 tablet (10 mEq total) by mouth daily.   rosuvastatin (CRESTOR) 20 MG tablet Take 1 tablet (20 mg total) by mouth daily.   EPINEPHrine (EPI-PEN) 0.3 mg/0.3 mL DEVI Inject 0.3 mLs (0.3 mg total) into the muscle once. (Patient not taking: Reported on 08/02/2021)   traMADol (ULTRAM) 50 MG tablet TAKE 1 TO 2 TABLETS BY MOUTH EVERY 12 HOURS AS NEEDED FOR PAIN (Patient taking differently: 50 mg.)   No facility-administered encounter medications on file as of 08/02/2021.   Activities of Daily Living In your present state of health, do you have any difficulty performing the following activities: 08/02/2021  Hearing? N  Vision? N  Difficulty concentrating or making  decisions? N  Walking or climbing stairs? Y  Dressing or bathing? N  Doing errands, shopping? N  Using the Toilet? N  In the past six months, have you accidently leaked urine? Y  Do you have problems with loss of bowel control? N  Managing your Medications? N  Managing your Finances? N  Housekeeping or managing your Housekeeping? N  Some recent data might be hidden   Patient Care Team: Moses MannersHensel, William A, MD as PCP - General (Family Medicine) Antony ContrasLyles, Graham, MD (Ophthalmology) Eber JonesShah, Rajiv E, MD as Referring Physician (Ophthalmology)    Assessment:   This is a routine wellness examination for Brianna Wheeler.  Exercise Activities and Dietary recommendations Current Exercise  Habits: The patient does not participate in regular exercise at present, Exercise limited by: orthopedic condition(s)   Goals       Acknowledge receipt of Advanced Directive package     Education provided to patient.      Weight (lb) < 250 lb (113.4 kg)       Fall Risk Fall Risk  08/02/2021 02/08/2020 01/12/2020 12/11/2019 04/22/2019  Falls in the past year? 1 1 1  0 0  Number falls in past yr: 0 1 0 0 0  Injury with Fall? 1 - 0 0 -  Comment - - - - -  Risk for fall due to : Orthopedic patient - - - -  Follow up Falls prevention discussed - Falls evaluation completed - Falls evaluation completed   Patient does report "occasional" decreased mobility and abnormal gait/balance. Patient does use a walker to ambulate "sometimes."   Is the patient's home free of loose throw rugs in walkways, pet beds, electrical cords, etc?   yes      Grab bars in the bathroom? yes      Handrails on the stairs?   yes      Adequate lighting?   yes  Patient rating of health (0-10) scale: 10  Depression Screen PHQ 2/9 Scores 08/02/2021 07/27/2021 04/26/2021 04/03/2021  PHQ - 2 Score 0 0 1 0  PHQ- 9 Score 1 1 1 1   Exception Documentation - - - -    Cognitive Function MMSE - Mini Mental State Exam 05/27/2018 09/24/2013 03/18/2013 02/12/2012   Orientation to time 5 5 5 5   Orientation to Place 5 5 5 5   Registration 3 3 3 3   Attention/ Calculation 5 5 5 5   Recall 3 2 2 3   Language- name 2 objects 2 2 2 2   Language- repeat 1 1 1 1   Language- follow 3 step command 3 3 3 3   Language- read & follow direction 1 1 1 1   Write a sentence 1 1 1 1   Copy design 1 1 1 1   Total score 30 29 29 30    6CIT Screen 08/02/2021 05/27/2018  What Year? 0 points 0 points  What month? 0 points 0 points  What time? 0 points 0 points  Count back from 20 0 points 0 points  Months in reverse 0 points 0 points  Repeat phrase 0 points 0 points  Total Score 0 0   Immunization History  Administered Date(s) Administered   Fluad Quad(high Dose 65+) 04/03/2021   Influenza Split 04/23/2011, 04/17/2012   Influenza Whole 04/23/2007, 04/23/2008, 04/28/2009, 04/25/2010   Influenza,inj,Quad PF,6+ Mos 03/19/2013, 04/01/2014, 03/31/2015, 05/01/2016, 04/02/2017, 03/25/2018, 03/19/2019, 04/20/2020   PFIZER Comirnaty(Gray Top)Covid-19 Tri-Sucrose Vaccine 12/08/2020   PFIZER(Purple Top)SARS-COV-2 Vaccination 04/20/2020, 05/12/2020   Pneumococcal Conjugate-13 01/12/2020   Pneumococcal Polysaccharide-23 05/10/2011   Td 10/22/2003   Tdap 12/31/2014   Screening Tests Health Maintenance  Topic Date Due   COLONOSCOPY (Pts 45-47yrs Insurance coverage will need to be confirmed)  Never done   Zoster Vaccines- Shingrix (1 of 2) Never done   Pneumonia Vaccine 61+ Years old (3 - PPSV23 if available, else PCV20) 01/11/2021   COVID-19 Vaccine (4 - Booster for Pfizer series) 02/02/2021   URINE MICROALBUMIN  06/30/2021   LIPID PANEL  07/27/2022   MAMMOGRAM  01/15/2023   TETANUS/TDAP  12/30/2024   INFLUENZA VACCINE  Completed   DEXA SCAN  Completed   Hepatitis C Screening  Completed   HPV VACCINES  Aged Out   Qualifies for  Shingles Vaccine? Yes   Shingrix Completed: No, Education has been provided regarding the importance of this vaccine. Advised may receive this  vaccine at local pharmacy or Health Dept. Aware to provide a copy of the vaccination record if obtained from local pharmacy or Health Dept. Verbalized acceptance and understanding.   Cancer Screenings: Lung: Low Dose CT Chest recommended if Age 76-80 years, 20 pack-year currently smoking OR have quit w/in 15years. Patient does not qualify. Breast:  Up to date on Mammogram? Yes   Up to date of Bone Density/Dexa? Yes Colorectal: Not UTD- discussed cologuard as an option.   Additional Screenings: Hepatitis C Screening: Completed  HIV Screening: Completed   Plan:  PCP apt scheduled 2/22. You are due for bivalent booster.  Plan to get this at PCP apt.  Fill out advance directive.   I have personally reviewed and noted the following in the patients chart:   Medical and social history Use of alcohol, tobacco or illicit drugs  Current medications and supplements Functional ability and status Nutritional status Physical activity Advanced directives List of other physicians Hospitalizations, surgeries, and ER visits in previous 12 months Vitals Screenings to include cognitive, depression, and falls Referrals and appointments  In addition, I have reviewed and discussed with patient certain preventive protocols, quality metrics, and best practice recommendations. A written personalized care plan for preventive services as well as general preventive health recommendations were provided to patient.  Brianna Wheeler, CMA  08/09/2021

## 2021-08-09 NOTE — Patient Instructions (Signed)
You spoke to Brianna Wheeler, [place] over the phone for your annual wellness visit.  We discussed goals:   Goals       Acknowledge receipt of Advanced Directive package     Education provided to patient.      Weight (lb) < 250 lb (113.4 kg)       We also discussed recommended health maintenance. As discussed, you are due for: Health Maintenance  Topic Date Due   COLONOSCOPY (Pts 45-1yrs Insurance coverage will need to be confirmed)  Never done   Zoster Vaccines- Shingrix (1 of 2) Never done   Pneumonia Vaccine 49+ Years old (3 - PPSV23 if available, else PCV20) 01/11/2021   COVID-19 Vaccine (4 - Booster for Pfizer series) 02/02/2021   URINE MICROALBUMIN  06/30/2021   LIPID PANEL  07/27/2022   MAMMOGRAM  01/15/2023   TETANUS/TDAP  12/30/2024   INFLUENZA VACCINE  Completed   DEXA SCAN  Completed   Hepatitis C Screening  Completed   HPV VACCINES  Aged Out   PCP apt scheduled 2/22. You are due for bivalent booster.  Plan to get this at PCP apt.  Fill out advance directive.   We also discussed Cologuard as a colon cancer screening option. Discuss with PCP at next visit.  Preventive Care 49 Years and Older, Female Preventive care refers to lifestyle choices and visits with your health care provider that can promote health and wellness. Preventive care visits are also called wellness exams. What can I expect for my preventive care visit? Counseling Your health care provider may ask you questions about your: Medical history, including: Past medical problems. Family medical history. Pregnancy and menstrual history. History of falls. Current health, including: Memory and ability to understand (cognition). Emotional well-being. Home life and relationship well-being. Sexual activity and sexual health. Lifestyle, including: Alcohol, nicotine or tobacco, and drug use. Access to firearms. Diet, exercise, and sleep habits. Work and work Statistician. Sunscreen use. Safety issues  such as seatbelt and bike helmet use. Physical exam Your health care provider will check your: Height and weight. These may be used to calculate your BMI (body mass index). BMI is a measurement that tells if you are at a healthy weight. Waist circumference. This measures the distance around your waistline. This measurement also tells if you are at a healthy weight and may help predict your risk of certain diseases, such as type 2 diabetes and high blood pressure. Heart rate and blood pressure. Body temperature. Skin for abnormal spots. What immunizations do I need? Vaccines are usually given at various ages, according to a schedule. Your health care provider will recommend vaccines for you based on your age, medical history, and lifestyle or other factors, such as travel or where you work. What tests do I need? Screening Your health care provider may recommend screening tests for certain conditions. This may include: Lipid and cholesterol levels. Hepatitis C test. Hepatitis B test. HIV (human immunodeficiency virus) test. STI (sexually transmitted infection) testing, if you are at risk. Lung cancer screening. Colorectal cancer screening. Diabetes screening. This is done by checking your blood sugar (glucose) after you have not eaten for a while (fasting). Mammogram. Talk with your health care provider about how often you should have regular mammograms. BRCA-related cancer screening. This may be done if you have a family history of breast, ovarian, tubal, or peritoneal cancers. Bone density scan. This is done to screen for osteoporosis. Talk with your health care provider about your test results, treatment  options, and if necessary, the need for more tests. Follow these instructions at home: Eating and drinking  Eat a diet that includes fresh fruits and vegetables, whole grains, lean protein, and low-fat dairy products. Limit your intake of foods with high amounts of sugar, saturated fats,  and salt. Take vitamin and mineral supplements as recommended by your health care provider. Do not drink alcohol if your health care provider tells you not to drink. If you drink alcohol: Limit how much you have to 0-1 drink a day. Know how much alcohol is in your drink. In the [place] one drink equals one 12 oz bottle of beer (355 mL), one 5 oz glass of wine (148 mL), or one 1 oz glass of hard liquor (44 mL). Lifestyle Brush your teeth every morning and night with fluoride toothpaste. Floss one time each day. Exercise for at least 30 minutes 5 or more days each week. Do not use any products that contain nicotine or tobacco. These products include cigarettes, chewing tobacco, and vaping devices, such as e-cigarettes. If you need help quitting, ask your health care provider. Do not use drugs. If you are sexually active, practice safe sex. Use a condom or other form of protection in order to prevent STIs. Take aspirin only as told by your health care provider. Make sure that you understand how much to take and what form to take. Work with your health care provider to find out whether it is safe and beneficial for you to take aspirin daily. Ask your health care provider if you need to take a cholesterol-lowering medicine (statin). Find healthy ways to manage stress, such as: Meditation, yoga, or listening to music. Journaling. Talking to a trusted person. Spending time with friends and family. Minimize exposure to UV radiation to reduce your risk of skin cancer. Safety Always wear your seat belt while driving or riding in a vehicle. Do not drive: If you have been drinking alcohol. Do not ride with someone who has been drinking. When you are tired or distracted. While texting. If you have been using any mind-altering substances or drugs. Wear a helmet and other protective equipment during sports activities. If you have firearms in your house, make sure you follow all gun safety  procedures. What's next? Visit your health care provider once a year for an annual wellness visit. Ask your health care provider how often you should have your eyes and teeth checked. Stay up to date on all vaccines. This information is not intended to replace advice given to you by your health care provider. Make sure you discuss any questions you have with your health care provider. Document Revised: 01/04/2021 Document Reviewed: 01/04/2021 Elsevier Patient Education  2022 [place] Prevention in the Home, Adult Falls can cause injuries and can happen to people of all ages. There are many things you can do to make your home safe and to help prevent falls. Ask for help when making these changes. What actions can I take to prevent falls? General Instructions Use good lighting in all rooms. Replace any light bulbs that burn out. Turn on the lights in dark areas. Use night-lights. Keep items that you use often in easy-to-reach places. Lower the shelves around your home if needed. Set up your furniture so you have a clear path. Avoid moving your furniture around. Do not have throw rugs or other things on the floor that can make you trip. Avoid walking on wet floors. If any of your floors are  uneven, fix them. Add color or contrast paint or tape to clearly mark and help you see: Grab bars or handrails. First and last steps of staircases. Where the edge of each step is. If you use a stepladder: Make sure that it is fully opened. Do not climb a closed stepladder. Make sure the sides of the stepladder are locked in place. Ask someone to hold the stepladder while you use it. Know where your pets are when moving through your home. What can I do in the bathroom?   Keep the floor dry. Clean up any water on the floor right away. Remove soap buildup in the tub or shower. Use nonskid mats or decals on the floor of the tub or shower. Attach bath mats securely with double-sided, nonslip  rug tape. If you need to sit down in the shower, use a plastic, nonslip stool. Install grab bars by the toilet and in the tub and shower. Do not use towel bars as grab bars. What can I do in the bedroom? Make sure that you have a light by your bed that is easy to reach. Do not use any sheets or blankets for your bed that hang to the floor. Have a firm chair with side arms that you can use for support when you get dressed. What can I do in the kitchen? Clean up any spills right away. If you need to reach something above you, use a step stool with a grab bar. Keep electrical cords out of the way. Do not use floor polish or wax that makes floors slippery. What can I do with my stairs? Do not leave any items on the stairs. Make sure that you have a light switch at the top and the bottom of the stairs. Make sure that there are handrails on both sides of the stairs. Fix handrails that are broken or loose. Install nonslip stair treads on all your stairs. Avoid having throw rugs at the top or bottom of the stairs. Choose a carpet that does not hide the edge of the steps on the stairs. Check carpeting to make sure that it is firmly attached to the stairs. Fix carpet that is loose or worn. What can I do on the outside of my home? Use bright outdoor lighting. Fix the edges of walkways and driveways and fix any cracks. Remove anything that might make you trip as you walk through a door, such as a raised step or threshold. Trim any bushes or trees on paths to your home. Check to see if handrails are loose or broken and that both sides of all steps have handrails. Install guardrails along the edges of any raised decks and porches. Clear paths of anything that can make you trip, such as tools or rocks. Have leaves, snow, or ice cleared regularly. Use sand or salt on paths during winter. Clean up any spills in your garage right away. This includes grease or oil spills. What other actions can I  take? Wear shoes that: Have a low heel. Do not wear high heels. Have rubber bottoms. Feel good on your feet and fit well. Are closed at the toe. Do not wear open-toe sandals. Use tools that help you move around if needed. These include: Canes. Walkers. Scooters. Crutches. Review your medicines with your doctor. Some medicines can make you feel dizzy. This can increase your chance of falling. Ask your doctor what else you can do to help prevent falls. Where to find more information Centers for Disease  Control and Prevention, STEADI: http://www.wolf.info/ National Institute on Aging: http://kim-miller.com/ Contact a doctor if: You are afraid of falling at home. You feel weak, drowsy, or dizzy at home. You fall at home. Summary There are many simple things that you can do to make your home safe and to help prevent falls. Ways to make your home safe include removing things that can make you trip and installing grab bars in the bathroom. Ask for help when making these changes in your home. This information is not intended to replace advice given to you by your health care provider. Make sure you discuss any questions you have with your health care provider. Document Revised: 02/10/2020 Document Reviewed: 02/10/2020 Elsevier Patient Education  2022 [employer].   Our clinic's number is [phone]. Please call with questions or concerns about what we discussed today.

## 2021-08-09 NOTE — Progress Notes (Signed)
I have reviewed this visit and agree with the documentation.   

## 2021-09-01 ENCOUNTER — Other Ambulatory Visit: Payer: Self-pay | Admitting: Family Medicine

## 2021-09-01 DIAGNOSIS — E785 Hyperlipidemia, unspecified: Secondary | ICD-10-CM

## 2021-09-13 ENCOUNTER — Ambulatory Visit (INDEPENDENT_AMBULATORY_CARE_PROVIDER_SITE_OTHER): Payer: Medicare Other | Admitting: Family Medicine

## 2021-09-13 ENCOUNTER — Encounter: Payer: Self-pay | Admitting: Family Medicine

## 2021-09-13 ENCOUNTER — Ambulatory Visit (INDEPENDENT_AMBULATORY_CARE_PROVIDER_SITE_OTHER): Payer: Medicare Other

## 2021-09-13 ENCOUNTER — Other Ambulatory Visit: Payer: Self-pay

## 2021-09-13 DIAGNOSIS — R296 Repeated falls: Secondary | ICD-10-CM | POA: Diagnosis not present

## 2021-09-13 DIAGNOSIS — Z23 Encounter for immunization: Secondary | ICD-10-CM | POA: Diagnosis not present

## 2021-09-13 DIAGNOSIS — R7303 Prediabetes: Secondary | ICD-10-CM

## 2021-09-13 DIAGNOSIS — Z1211 Encounter for screening for malignant neoplasm of colon: Secondary | ICD-10-CM | POA: Diagnosis not present

## 2021-09-13 NOTE — Progress Notes (Signed)
° ° °  SUBJECTIVE:   CHIEF COMPLAINT / HPI:   Another fall at home. Has small depression in her flooring which caused her to lose her balance and fall.  This is second fall recently.  No injuries but the fall did agrevate her chronic neck and low back pain.  Desires manual wheelchair to improve her ADLs at home. She has failed a walker (fall occurred with the walker.)  She has the arm strength to operate a manual wheelchair.  She will improve her ADLs of dressing, feeding, toileting and grooming because it will allow her to get where she needs to go and make easier transfers.  Due for COVID booster. Wants cologuard for colon cancer screen Began discussing shingles vaccine.  She is apprehensive about frequency of side effects.    OBJECTIVE:   BP 139/61    Pulse 61    Wt (!) 312 lb 3.2 oz (141.6 kg)    SpO2 99%    BMI 43.54 kg/m   Lungs clear Cardiac RRR without m or g  ASSESSMENT/PLAN:   Prediabetes microalbumin urine screen.  Frequent falls Fortunately only contusions.  Ordered manual wheelchair as requested.  No PT for now.  Colon cancer screening Cologuard ordered.  Morbid obesity (HCC) She was at first reluctant about COVID booster.  When I pointed out her age and obesity put her at higher risk, she agreed to booster.     [name], MD [facility]

## 2021-09-13 NOTE — Assessment & Plan Note (Signed)
microalbumin urine screen.

## 2021-09-13 NOTE — Assessment & Plan Note (Signed)
Fortunately only contusions.  Ordered manual wheelchair as requested.  No PT for now.

## 2021-09-13 NOTE — Assessment & Plan Note (Signed)
She was at first reluctant about COVID booster.  When I pointed out her age and obesity put her at higher risk, she agreed to booster.

## 2021-09-13 NOTE — Assessment & Plan Note (Signed)
Cologuard ordered

## 2021-09-13 NOTE — Patient Instructions (Signed)
You will get a Covid booster today.   I am checking your kidneys with a urine test. I ordered the colon cancer test, Cologuard, which should come to your house. I ordered a regular wheelchair.   Next visit, three months, we will talk about the shingles vaccine.

## 2021-09-14 LAB — MICROALBUMIN / CREATININE URINE RATIO
Creatinine, Urine: 141.4 mg/dL
Microalb/Creat Ratio: 2 mg/g creat (ref 0–29)
Microalbumin, Urine: 3.2 ug/mL

## 2021-09-15 ENCOUNTER — Telehealth: Payer: Self-pay

## 2021-09-15 NOTE — Telephone Encounter (Signed)
Patient calls nurse line reporting reaction to covid booster on 2/22.  Patient reports symptoms started yesterday. Patient reports "warm" swollen arm. Patient denies fever, chills, body aches or SOB.  Patient advised she can use cold compresses and Tylenol for the discomfort.   Red flags discussed with patient.

## 2021-10-16 ENCOUNTER — Other Ambulatory Visit: Payer: Self-pay | Admitting: Family Medicine

## 2021-10-16 DIAGNOSIS — M5441 Lumbago with sciatica, right side: Secondary | ICD-10-CM

## 2021-12-11 ENCOUNTER — Ambulatory Visit: Payer: Medicare Other | Admitting: Family Medicine

## 2021-12-14 ENCOUNTER — Encounter: Payer: Self-pay | Admitting: Family Medicine

## 2021-12-14 ENCOUNTER — Ambulatory Visit (INDEPENDENT_AMBULATORY_CARE_PROVIDER_SITE_OTHER): Payer: Medicare Other | Admitting: Family Medicine

## 2021-12-14 DIAGNOSIS — M546 Pain in thoracic spine: Secondary | ICD-10-CM

## 2021-12-14 NOTE — Patient Instructions (Signed)
Let me know if I need to help with the wheelchair.  It takes time.   See me in July.  We will do blood work at that visit.

## 2021-12-14 NOTE — Assessment & Plan Note (Signed)
Worsened by fall.  Encouraged to remain active.

## 2021-12-14 NOTE — Progress Notes (Signed)
    SUBJECTIVE:   CHIEF COMPLAINT / HPI:   FU fall.  Patient fell 5 weeks ago. Has a "hole in the floor" of her laundry room.  She rents and the landlord has drug his feet in repairing.  Still not repaired.  Fall caused a flare of her chronic back pain and bilateral knee pain.  No fall since.  Weakness is general/bilateral leg.  Now using walker.  A manual wheelchair has been prescribed.  Denies unilateral numbness or weakness.    She is less active because of pain and fear of falling.  Weight has gone up a bit.  She is aware she needs to work to remain active, even when she gets her wheelchair.  Plans to use the wheelchair when up longer periods of time (e.g. shopping) and continue to use her walker in her home.    OBJECTIVE:   BP 113/69   Pulse 69   Ht 5\' 11"  (1.803 m)   Wt (!) 309 lb (140.2 kg)   SpO2 100%   BMI 43.10 kg/m   Good spirits.  Gait normal using walker.    ASSESSMENT/PLAN:   Back pain Worsened by fall.  Encouraged to remain active.  Morbid obesity (HCC) Weight up some.  Encouraged to increase activity.     , MD [facility]

## 2021-12-14 NOTE — Assessment & Plan Note (Signed)
Weight up some.  Encouraged to increase activity.

## 2021-12-18 ENCOUNTER — Other Ambulatory Visit: Payer: Self-pay | Admitting: Family Medicine

## 2021-12-18 DIAGNOSIS — J329 Chronic sinusitis, unspecified: Secondary | ICD-10-CM

## 2021-12-19 ENCOUNTER — Ambulatory Visit (INDEPENDENT_AMBULATORY_CARE_PROVIDER_SITE_OTHER): Payer: Medicare Other | Admitting: Family Medicine

## 2021-12-19 VITALS — BP 133/70 | HR 73 | Temp 98.5°F | Ht 71.0 in | Wt 300.6 lb

## 2021-12-19 DIAGNOSIS — R2681 Unsteadiness on feet: Secondary | ICD-10-CM

## 2021-12-19 DIAGNOSIS — J069 Acute upper respiratory infection, unspecified: Secondary | ICD-10-CM

## 2021-12-19 DIAGNOSIS — R296 Repeated falls: Secondary | ICD-10-CM | POA: Diagnosis not present

## 2021-12-19 NOTE — Patient Instructions (Signed)
It was great to meet you.  For your falls: I have placed an order for home-health physical therapy. I'm glad you have an appointment soon to get a wheelchair.  For your congestion and cough: this is likely a virus (common cold). You can pick up the Tessalon from the pharmacy. Honey is also very helpful for cough. Use your inhaler as needed. It may take 5-7 days before you see improvement. If you develop difficulty breathing or other concerns please let us know.  Take care, Dr Rock Nephew

## 2021-12-19 NOTE — Assessment & Plan Note (Signed)
Acute x3-4 days. No red flags on history or exam. Discussed supportive care with Tessalon, honey, hydration, rest, and albuterol inhaler prn. Return precautions reviewed.

## 2021-12-19 NOTE — Assessment & Plan Note (Signed)
Fortunately did not sustain any injury with most recent fall. Prevention of future falls to avoid serious injury will be essential. Continue using walker. She has upcoming evaluation for wheelchair. While this may help prevent some falls, it could be detrimental to her long term strength and function. She is profoundly deconditioned and lack of lower extremity strength is certainly contributing. Orders placed for home health PT/OT.

## 2021-12-19 NOTE — Progress Notes (Signed)
    SUBJECTIVE:   CHIEF COMPLAINT / HPI:   Fall Occurred 2 days ago. Slid out of bed when trying to get up. Did not sustain any injuries. EMS came and helped her up.  States she has an appointment June 22nd for wheelchair evaluation She is scared about falling as she has had multiple falls  Cough, Runny nose Symptoms started 3-4 days ago. She endorses runny nose and nonproductive cough. Feels it dripping in the back of her throat. No fever, chest pain or shortness of breath. No sick contacts. Used Delsym without relief. Used her albuterol inhaler which she found helpful. Thinks it's related to the cold weather and not having enough oil in the house.  Dr Leveda Anna has sent in [place] which she hasn't picked up yet.   PERTINENT  PMH / PSH: obesity, osteoarthritis, HTN, prediabetes  OBJECTIVE:   BP 133/70   Pulse 73   Temp 98.5 F (36.9 C)   Ht 5\' 11"  (1.803 m)   Wt (!) 300 lb 9.6 oz (136.4 kg)   SpO2 97%   BMI 41.93 kg/m   Gen: NAD, obese HEENT: nares patent, moist mucous membranes, oropharynx unremarkable Neck: no cervical lymphadenopathy CV: RRR, normal S1/S2 Resp: Normal effort, lungs CTAB Skin: warm and dry, no rashes noted Neuro: alert, no obvious focal deficits MSK: unable to stand from seated position without significant effort and assistance of walker due to lower extremity muscle weakness. No tenderness to palpation of lower extremities or back   ASSESSMENT/PLAN:   Frequent falls Fortunately did not sustain any injury with most recent fall. Prevention of future falls to avoid serious injury will be essential. Continue using walker. She has upcoming evaluation for wheelchair. While this may help prevent some falls, it could be detrimental to her long term strength and function. She is profoundly deconditioned and lack of lower extremity strength is certainly contributing. Orders placed for home health PT/OT.  Viral URI with cough Acute x3-4 days. No red flags on  history or exam. Discussed supportive care with Tessalon, honey, hydration, rest, and albuterol inhaler prn. Return precautions reviewed.     , MD [facility]

## 2021-12-25 ENCOUNTER — Encounter: Payer: Self-pay | Admitting: Family Medicine

## 2021-12-25 ENCOUNTER — Other Ambulatory Visit: Payer: Self-pay | Admitting: Family Medicine

## 2021-12-25 ENCOUNTER — Ambulatory Visit (INDEPENDENT_AMBULATORY_CARE_PROVIDER_SITE_OTHER): Payer: Medicare Other | Admitting: Family Medicine

## 2021-12-25 VITALS — BP 121/81 | HR 71 | Ht 71.0 in | Wt 297.0 lb

## 2021-12-25 DIAGNOSIS — J302 Other seasonal allergic rhinitis: Secondary | ICD-10-CM | POA: Diagnosis not present

## 2021-12-25 DIAGNOSIS — J4520 Mild intermittent asthma, uncomplicated: Secondary | ICD-10-CM

## 2021-12-25 MED ORDER — FLUTICASONE PROPIONATE 50 MCG/ACT NA SUSP: 2.0000 | Freq: Every day | NASAL | 1 refills | Status: DC

## 2021-12-25 MED ORDER — PREDNISONE 50 MG PO TABS
ORAL_TABLET | ORAL | 0 refills | Status: DC
Start: 2021-12-25 — End: 2022-01-22

## 2021-12-25 NOTE — Progress Notes (Signed)
    SUBJECTIVE:   CHIEF COMPLAINT / HPI: Persistent cough  Patient reports cough since last Tuesday.  Has tried albuterol and Tessalon Perles without effectiveness.  Denies any fevers, nausea vomiting, diarrhea.  Endorses productive cough with runny nose and intermittent wheezing.  Denies any shortness of breath or chest pain.  PERTINENT  PMH / PSH:  Asthma  OBJECTIVE:   BP 121/81   Pulse 71   Ht 5\' 11"  (1.803 m)   Wt 297 lb (134.7 kg)   SpO2 97%   BMI 41.42 kg/m    General: Alert, no acute distress Cardio: Normal S1 and S2, RRR, no r/m/g Pulm: Diminished at bases, mild wheezing, more notable upper anterior lobes, prolonged expiratory phase, normal work of breathing  ASSESSMENT/PLAN:   Asthma Concern for mild asthma exacerbation given no improvement and bilateral anterior upper lobe wheezing with persistent cough not relieved with albuterol. -Continue albuterol to 4-6h as needed for shortness of breath or wheezing -Prednisone 50 mg daily x5 days -Recommend follow-up with PCP to discuss controller medications. -Strict return precautions provided  Allergic rhinitis Refill Flovent nasal spray     , MD [facility]

## 2021-12-25 NOTE — Patient Instructions (Addendum)
Thank you for coming to see me today. It was a pleasure.   Start Flovent inhaler 1 puff twice a day Start Prednisone 50 mg daily for 5 days Use Albuterol 1-2 puffs every 4 hours as needed for shortness of breath or wheezing  Avoid triggers  Return to clinic if any worsening symptoms or fevers.   Recommend Shingles vaccine.  This is a 2 dose series and can be given at your local pharmacy.  Please talk to your pharmacist about this.   Recommend Pneumonia vaccine.   Please follow-up with PCP as needed  If you have any questions or concerns, please do not hesitate to call the office at [phone].  Best,   Dana Allan, MD

## 2021-12-26 ENCOUNTER — Encounter: Payer: Self-pay | Admitting: *Deleted

## 2021-12-31 ENCOUNTER — Encounter: Payer: Self-pay | Admitting: Family Medicine

## 2021-12-31 NOTE — Assessment & Plan Note (Addendum)
Concern for mild asthma exacerbation given no improvement and bilateral anterior upper lobe wheezing with persistent cough not relieved with albuterol. -Continue albuterol to 4-6h as needed for shortness of breath or wheezing -Prednisone 50 mg daily x5 days -Recommend follow-up with PCP to discuss controller medications. -Strict return precautions provided

## 2021-12-31 NOTE — Assessment & Plan Note (Signed)
Refill Flovent nasal spray

## 2022-01-05 ENCOUNTER — Telehealth: Payer: Self-pay

## 2022-01-05 NOTE — Telephone Encounter (Signed)
Called.  She has had several back to back illnesses.  She has complaints of weakness, some room spinning, feels unsteady.  Also nausea and decreased appetite.  Cough improved.  No fever.  Getting adaquate PO.  She feels she may be on the mend.  Asked to schedule a FU appointment with me in the next couple of weeks.  I want to be sure that she does not continue to decline in general health.

## 2022-01-05 NOTE — Telephone Encounter (Signed)
Patient calls nurse line regarding follow up from visit on 6/5. Patient reports that cough has improved since finishing prednisone. However, she continues to have nausea and fatigue. Patient denies vomiting, abdominal pain or fever. States that she is tolerating liquids well and has been eating crackers and chicken noodle soup.   Advised of continued supportive measures. Patient wanted message to be sent to PCP as she was concerned that she has been experiencing symptoms since the beginning of the month. Patient is asking for any additional recommendations from Dr. Leveda Anna.   Please advise.   Veronda Prude, RN

## 2022-01-11 ENCOUNTER — Ambulatory Visit: Payer: Medicare Other | Admitting: Physical Therapy

## 2022-01-12 DIAGNOSIS — I10 Essential (primary) hypertension: Secondary | ICD-10-CM | POA: Diagnosis not present

## 2022-01-12 DIAGNOSIS — R2681 Unsteadiness on feet: Secondary | ICD-10-CM | POA: Diagnosis not present

## 2022-01-12 DIAGNOSIS — R7309 Other abnormal glucose: Secondary | ICD-10-CM | POA: Diagnosis not present

## 2022-01-12 DIAGNOSIS — R2689 Other abnormalities of gait and mobility: Secondary | ICD-10-CM | POA: Diagnosis not present

## 2022-01-12 DIAGNOSIS — M1990 Unspecified osteoarthritis, unspecified site: Secondary | ICD-10-CM | POA: Diagnosis not present

## 2022-01-12 DIAGNOSIS — J069 Acute upper respiratory infection, unspecified: Secondary | ICD-10-CM | POA: Diagnosis not present

## 2022-01-12 DIAGNOSIS — R296 Repeated falls: Secondary | ICD-10-CM | POA: Diagnosis not present

## 2022-01-15 ENCOUNTER — Telehealth: Payer: Self-pay

## 2022-01-15 DIAGNOSIS — R2681 Unsteadiness on feet: Secondary | ICD-10-CM | POA: Diagnosis not present

## 2022-01-15 DIAGNOSIS — I10 Essential (primary) hypertension: Secondary | ICD-10-CM | POA: Diagnosis not present

## 2022-01-15 DIAGNOSIS — R7309 Other abnormal glucose: Secondary | ICD-10-CM | POA: Diagnosis not present

## 2022-01-15 DIAGNOSIS — J069 Acute upper respiratory infection, unspecified: Secondary | ICD-10-CM | POA: Diagnosis not present

## 2022-01-15 DIAGNOSIS — M1990 Unspecified osteoarthritis, unspecified site: Secondary | ICD-10-CM | POA: Diagnosis not present

## 2022-01-15 DIAGNOSIS — R2689 Other abnormalities of gait and mobility: Secondary | ICD-10-CM | POA: Diagnosis not present

## 2022-01-15 DIAGNOSIS — R296 Repeated falls: Secondary | ICD-10-CM | POA: Diagnosis not present

## 2022-01-17 DIAGNOSIS — M1990 Unspecified osteoarthritis, unspecified site: Secondary | ICD-10-CM | POA: Diagnosis not present

## 2022-01-17 DIAGNOSIS — R2681 Unsteadiness on feet: Secondary | ICD-10-CM | POA: Diagnosis not present

## 2022-01-17 DIAGNOSIS — I10 Essential (primary) hypertension: Secondary | ICD-10-CM | POA: Diagnosis not present

## 2022-01-17 DIAGNOSIS — R7309 Other abnormal glucose: Secondary | ICD-10-CM | POA: Diagnosis not present

## 2022-01-17 DIAGNOSIS — R296 Repeated falls: Secondary | ICD-10-CM | POA: Diagnosis not present

## 2022-01-17 DIAGNOSIS — J069 Acute upper respiratory infection, unspecified: Secondary | ICD-10-CM | POA: Diagnosis not present

## 2022-01-17 DIAGNOSIS — R2689 Other abnormalities of gait and mobility: Secondary | ICD-10-CM | POA: Diagnosis not present

## 2022-01-22 ENCOUNTER — Encounter: Payer: Self-pay | Admitting: Family Medicine

## 2022-01-22 ENCOUNTER — Ambulatory Visit (INDEPENDENT_AMBULATORY_CARE_PROVIDER_SITE_OTHER): Payer: Medicare Other | Admitting: Family Medicine

## 2022-01-22 DIAGNOSIS — M546 Pain in thoracic spine: Secondary | ICD-10-CM | POA: Diagnosis not present

## 2022-01-22 DIAGNOSIS — I10 Essential (primary) hypertension: Secondary | ICD-10-CM

## 2022-01-22 DIAGNOSIS — R296 Repeated falls: Secondary | ICD-10-CM | POA: Diagnosis not present

## 2022-01-22 DIAGNOSIS — F4329 Adjustment disorder with other symptoms: Secondary | ICD-10-CM

## 2022-01-22 LAB — POCT UA - MICROALBUMIN
Creatinine, POC: 100 mg/dL
Microalbumin Ur, POC: 10 mg/L

## 2022-01-22 MED ORDER — HYDROCHLOROTHIAZIDE 12.5 MG PO TABS: 12.5000 mg | ORAL_TABLET | Freq: Every day | ORAL | 3 refills | Status: DC

## 2022-01-22 NOTE — Assessment & Plan Note (Signed)
Slow improvement with PT.

## 2022-01-22 NOTE — Assessment & Plan Note (Signed)
If still present at one month FU, consider SNRI.

## 2022-01-22 NOTE — Progress Notes (Signed)
    SUBJECTIVE:   CHIEF COMPLAINT / HPI:    Fall/weakness.  Now using walker.  Working with PT and OT.  Making only slow progress.  No falls since last visit. Hypertension.  Has occasional lightheadedness.  Only on HCTZ 25.  Due for annual urine micro albumin.   Depression.  Having some depressed mood related to her current poor functional capacity.  Not currently on antidepressant or in counseling.  No SI/HI.      OBJECTIVE:   BP (!) 120/47   Pulse 69   Ht 5\' 11"  (1.803 m)   Wt (!) 305 lb 3.2 oz (138.4 kg)   SpO2 98%   BMI 42.57 kg/m   Lowish BP noted Lungs clear Cardiac RRR without m or g Obviously slow due to back pain on standing from chair.  Seems to be steady walking with her walker.    ASSESSMENT/PLAN:   No problem-specific Assessment & Plan notes found for this encounter.     , MD [facility]

## 2022-01-22 NOTE — Assessment & Plan Note (Signed)
Over treated at present.  Decrease HCTZ to 12.5 mg daily.

## 2022-01-22 NOTE — Assessment & Plan Note (Signed)
Improving with PT

## 2022-01-22 NOTE — Assessment & Plan Note (Signed)
Weight up.  Discussed being more active, which is hard for her at present.

## 2022-01-22 NOTE — Patient Instructions (Addendum)
The only medicine change I am making is to cut back on your blood pressure/fluid pill.  I sent in a new prescription. Keep fighting to get get stronger

## 2022-01-24 DIAGNOSIS — R296 Repeated falls: Secondary | ICD-10-CM | POA: Diagnosis not present

## 2022-01-24 DIAGNOSIS — R7309 Other abnormal glucose: Secondary | ICD-10-CM | POA: Diagnosis not present

## 2022-01-24 DIAGNOSIS — J069 Acute upper respiratory infection, unspecified: Secondary | ICD-10-CM | POA: Diagnosis not present

## 2022-01-24 DIAGNOSIS — M1990 Unspecified osteoarthritis, unspecified site: Secondary | ICD-10-CM | POA: Diagnosis not present

## 2022-01-24 DIAGNOSIS — I10 Essential (primary) hypertension: Secondary | ICD-10-CM | POA: Diagnosis not present

## 2022-01-24 DIAGNOSIS — R2689 Other abnormalities of gait and mobility: Secondary | ICD-10-CM | POA: Diagnosis not present

## 2022-01-24 DIAGNOSIS — R2681 Unsteadiness on feet: Secondary | ICD-10-CM | POA: Diagnosis not present

## 2022-01-25 DIAGNOSIS — R2681 Unsteadiness on feet: Secondary | ICD-10-CM | POA: Diagnosis not present

## 2022-01-25 DIAGNOSIS — J069 Acute upper respiratory infection, unspecified: Secondary | ICD-10-CM | POA: Diagnosis not present

## 2022-01-25 DIAGNOSIS — R2689 Other abnormalities of gait and mobility: Secondary | ICD-10-CM | POA: Diagnosis not present

## 2022-01-25 DIAGNOSIS — M1990 Unspecified osteoarthritis, unspecified site: Secondary | ICD-10-CM | POA: Diagnosis not present

## 2022-01-25 DIAGNOSIS — I10 Essential (primary) hypertension: Secondary | ICD-10-CM | POA: Diagnosis not present

## 2022-01-25 DIAGNOSIS — R7309 Other abnormal glucose: Secondary | ICD-10-CM | POA: Diagnosis not present

## 2022-01-25 DIAGNOSIS — R296 Repeated falls: Secondary | ICD-10-CM | POA: Diagnosis not present

## 2022-02-01 ENCOUNTER — Telehealth: Payer: Self-pay

## 2022-02-01 NOTE — Telephone Encounter (Signed)
Patient calls nurse line regarding physical therapy. Patient reports that she feels like she is not getting a lot of improvement, as PT is only working on her feet.   I advised that patient should reach out to Home Health agency to report concerns and request additional exercises for back pain.   Patient is also requesting prescription for compression stockings. This needs to include the size and pressure.   Patient is requesting that prescription be sent to [facility].   Forwarding to PCP;  Veronda Prude, RN

## 2022-02-05 ENCOUNTER — Ambulatory Visit: Payer: Medicare Other | Attending: Family Medicine

## 2022-02-05 DIAGNOSIS — R2689 Other abnormalities of gait and mobility: Secondary | ICD-10-CM | POA: Insufficient documentation

## 2022-02-05 DIAGNOSIS — M6281 Muscle weakness (generalized): Secondary | ICD-10-CM | POA: Insufficient documentation

## 2022-02-05 NOTE — Therapy (Signed)
OUTPATIENT PHYSICAL THERAPY WHEELCHAIR EVALUATION   Patient Name: Brianna Wheeler MRN: [id number] DOB:1956/05/11, 66 y.o., female Today's Date: 02/05/2022   PT End of Session - 02/05/22 1126     Visit Number 1    Number of Visits 1    PT Start Time 1145    PT Stop Time 1230    PT Time Calculation (min) 45 min    Equipment Utilized During Treatment Gait belt    Activity Tolerance Patient tolerated treatment well    Behavior During Therapy WFL for tasks assessed/performed             Past Medical History:  Diagnosis Date   Diabetes mellitus    Hyperlipidemia    Hypertension    Left foot pain 2005   Chronic injury -- multiple broken bones and torn ligaments after accident in garage   Past Surgical History:  Procedure Laterality Date   EYE SURGERY     LUMBAR DISC SURGERY     TUBAL LIGATION     Patient Active Problem List   Diagnosis Date Noted   Frequent falls 09/13/2021   Hypokalemia 07/28/2021   Morbid obesity (HCC) 12/08/2020   Adjustment reaction with mixed emotional features 09/12/2020   Mass of thoracic structure 02/09/2020   Fall as cause of accidental injury at home as place of occurrence 01/13/2020   Serum gammaglobulin increased 02/11/2019   Edema 01/28/2019   Nocturia 01/28/2019   Intractable acute post-traumatic headache 12/11/2018   GERD (gastroesophageal reflux disease) 05/01/2016   Vertebral compression fracture (HCC) 12/09/2015   Metatarsalgia of both feet 09/20/2015   Allergic rhinitis 08/30/2015   Transaminitis 04/29/2015   Cataract 01/01/2013   Back pain 06/10/2012   Colon cancer screening 02/11/2012   Asthma 12/07/2011   Depression 05/10/2011   Chronic sinusitis 09/20/2010   Osteoarthritis 04/07/2009   Hypothyroidism 03/03/2008   Lumbar back pain with radiculopathy affecting right lower extremity 10/03/2007   Prediabetes 05/08/2007   HLD (hyperlipidemia) 09/19/2006   HYPERTENSION, BENIGN SYSTEMIC 09/19/2006   Diaphragmatic hernia  09/19/2006    PCP: [name], MD   REFERRING PROVIDER: [name], MD   THERAPY DIAG:  Other abnormalities of gait and mobility  Muscle weakness (generalized)  Rationale for Evaluation and Treatment Rehabilitation  SUBJECTIVE:                                                                                                                                                                                           SUBJECTIVE STATEMENT: Pt present or wheelchair evaluation. Pt reports it is hard for her to stand and walk. Pt reports she  has 2 compression fractures in her spine. Pt reports that MD is not recommending back surgeries due to high risk of paralysis. Pt has had 4 falls in last 6 months.  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  PATIENT GOALS Be able to be more functional.         MEDICAL HISTORY:  Primary diagnosis onset: 01/15/22   Diagnosis code/   Diagnosis:  R29.6 (ICD-10-CM) - Repeated falls  E66.01 (ICD-10-CM) - Morbid (severe) obesity due to excess calories  R73.03 (ICD-10-CM) - Prediabetes    [x] Progressive disease  Relevant future surgeries:   none  Height: 5'11" Weight: 310 lbs Explain recent changes or trends in weight:  Pt has gained 50lbs in last year per pt report    History:  Past Medical History:  Diagnosis Date   Diabetes mellitus    Hyperlipidemia    Hypertension    Left foot pain 2005   Chronic injury -- multiple broken bones and torn ligaments after accident in garage            Cardio Status:  Functional Limitations:   [x] Intact  []  Impaired      Respiratory Status:  Functional Limitations:   [x] Intact  [] Impaired   [] SOB [] COPD [] O2 Dependent ______LPM  [] Ventilator Dependent  Resp equip:                                                      Objective Measure(s):   Orthotics:   [] Amputee:                                                             [] Prosthesis:        HOME ENVIRONMENT:  [x] House [] Condo/town home [] Apartment [] Asst living [] LTCF         [] Own  [] Rent   [] Lives alone [x] Lives with others -   son                          Hours without assistance: 0 hours  [x] Home is accessible to patient                                 Storage of wheelchair:  [x] In home   [] Other Comments:        COMMUNITY :  TRANSPORTATION:  [] Car [x] Van [] Public Transportation [] Adapted w/c Lift []  Ambulance [] Other:                     [] Sits in wheelchair during transport   Where is w/c stored during transport?  [] [place]  []  [employer]  r   [x] Self-Driver       Drive while in  Biomedical scientist [] yes [x] no   Employment and/or school:  Specific requirements pertaining to mobility        Other:  COMMUNICATION:  Neurosurgeon  [  x]WFL [] receptive [] WFL [] expressive [] Understandable  [] Difficult to understand  [] non-communicative  Primary Language:__English____________ 2nd:_____________  Communication provided by:[x] Patient [] Family [] Caregiver [] Translator   [] Uses an augmentative communication device     Manufacturer/Model :                                                                MOBILITY/BALANCE:  Sitting Balance  Standing Balance  Transfers  Ambulation   [x] WFL      [] WFL  [x] Independent  []  Independent   [] Uses UE for balance in sitting Comments:  [x] Uses UE/device for stability Comments: Needs walker for assistance/stability []  Min assist  []  Ambulates independently with       device:___________________      []  Mod assist  [x]  Able to ambulate ____50__ feet        safely/functionally/independently   []  Min assist  []  Min assist  []  Max assist  []  Non-functional ambulator         History/High risk of falls   []  Mod assist  []  Mod assist  []  Dependent  []  Unable to ambulate   []  Max  assist   []  Max assist  Transfer method:r1 person r2 person rsliding board rsquat pivot rstand pivot rmechanical patient lift  rother:   []  Unable  []  Unable    Fall History: # of falls in the past 6 months? 4 # of "near" falls in the past 6 months? 4+    CURRENT SEATING / MOBILITY:  Current Mobility Device: [] None [x] Cane/Walker [] Manual [] Dependent [] Dependent w/ Tilt rScooter [] Power (type of control):   Manufacturer:  Model:  Serial #:   Size:  Color:  Age:   Purchased by whom:   Current condition of mobility base:    Current seating system:                                                                       Age of seating system:    Describe posture in present seating system:    Is the current mobility meeting medical necessity?:  [] Yes [] No Describe:                                     Ability to complete Mobility-Related Activities of Daily Living (MRADL's) with Current Mobility Device:   Move room to room  [] Independent  [x] Min [] Mod [] Max assist  [] Unable  Comments: sometimes requires assistance of her son with sit to stand transfers  Meal prep  [] Independent  [x] Min [] Mod [] Max assist  [] Unable    Feeding  [x] Independent  [] Min [] Mod [] Max assist  [] Unable    Bathing  [x] Independent  [] Min [] Mod [] Max assist  [] Unable    Grooming  [x] Independent  [] Min [] Mod [] Max assist  [] Unable    UE dressing  [x] Independent  [] Min [] Mod [] Max assist  [] Unable    LE dressing  [x] Independent   [] Min [] Mod [] Max assist  [] Unable    Toileting  [  x]Independent  Min Mod Max assist  Unable    Bowel Mgt:  Continent  Incontinent  Accidents  Diapers  Colostomy  Bowel Program:  Bladder Mgt:  Continent  Incontinent  Accidents  Diapers  Urinal  Intermittent Cath  Indwelling Cath  Supra-pubic Cath     Current Mobility Equipment Trialed/ Ruled Out:    Does not meet mobility needs due to:    Loraine Leriche all boxes that indicate inability to use the specific equipment listed      Meets needs for safe  independent functional  ambulation  / mobility    Risk of  Falling or History of Falls    Enviromental limitations      Cognition    Safety concerns with  physical ability    Decreased / limitations endurance  & strength     Decreased / limitations  motor skills  & coordination    Pain    Pace /  Speed    Cardiac and/or  respiratory condition    Contra - indicated by diagnosis   Cane/Crutches                         Walker / Rollator   NA                           Manual Wheelchair [id number]:   NA                         Manual W/C (K0005) with power assist   NA                         Scooter   NA                         Power Wheelchair: standard joystick   NA                         Power Wheelchair: alternative controls   NA                         Summary:  The least costly alternative for independent functional mobility was found to be:     Crutch/Cane   Walker  Manual w/c   Manual w/c with power assist    Scooter    Power w/c std joystick    Power w/c alternative control         Requires dependent care mobility device   Cabin crew for [employer] skills are adequate for safe mobility equipment operation    Yes   No  Patient is willing and motivated to use recommended mobility equipment    Yes   No        Patient is unable to safely operate mobility equipment independently and requires dependent care equipment Comments:           SENSATION and SKIN ISSUES:  Sensation  Intact   Impaired  Absent  Hyposensate  Hypersensate   Defensiveness  Location(s) of impairment:    Pressure Relief Method(s):   Lean side to side to offload (without risk of falling)    W/C push up (4+  times/hour for 15+ seconds)  Stand up (without risk of falling)     Other: (Describe): Effective pressure  relief method(s) above can be performed consistently throughout the day: rYes  r No If not, Why?:  Skin Integrity Risk:       [x]  Low risk           []  Moderate risk            []  High risk  If high risk, explain:   Skin Issues/Skin Integrity  Current skin Issues  []  Yes [x]  No [x]  Intact  []   Red area   []   Open area  []  Scar tissue  []  At risk from prolonged sitting  Where: History of Skin Issues  []  Yes [x]  No Where : When: Stage: Hx of skin flap surgeries  []  Yes [x]  No Where:  When:  Pain: [x]  Yes []  No   Pain Location(s): Back pain, bil LE referred pain Intensity scale: (0-10) : 5-10/10 How does pain interfere with mobility and/or MRADLs? - difficulty with prolonged standing, transfers, walking        MAT EVALUATION:  Neuro-Muscular Status: (Tone, Reflexive, Responses, etc.)     []   Intact   []  Spasticity:  []  Hypotonicity  []  Fluctuating  []  Muscle Spasms  []  Poor Righting Reactions/Poor Equilibrium Reactions  []  Primal Reflex(s):    Comments:            COMMENTS:    POSTURE:     Comments:  Pelvis Anterior/Posterior:  []  Neutral   [x]  Posterior  []  Anterior  []  Fixed - No movement []  Tendency away from neutral [x]  Flexible [x]  Self-correction []  External correction Obliquity (viewed from front)  [x]  WFL []  R Obliquity []  L Obliquity  []  Fixed - No movement []  Tendency away from neutral [x]  Flexible [x]  Self-correction []  External correction Rotation  [x]  WFL []  R anterior []  L anterior  []  Fixed - No movement []  Tendency away from neutral [x]  Flexible [x]  Self-correction []  External correction Tonal Influence Pelvis:  [x]  Normal []  Flaccid []  Low tone []  Spasticity []  Dystonia []  Pelvis thrust []  Other:    Trunk Anterior/Posterior:  []  WFL [x]  Thoracic kyphosis []  Lumbar lordosis  []  Fixed - No movement []   Tendency away from neutral [x]  Flexible [x]  Self-correction []  External correction  [x]  WFL []  Convex to left  []  Convex to right []  S-curve   []  C-curve []  Multiple curves []  Tendency away from neutral [x]  Flexible [x]  Self-correction []  External correction Rotation of shoulders and upper trunk:  [x]  Neutral []  Left-anterior []  Right- anterior []  Fixed- no movement []  Tendency away from neutral [x]  Flexible [x]  Self correction []  External correction Tonal influence Trunk:  [x]  Normal []  Flaccid []  Low tone []  Spasticity []  Dystonia []  Other:   Head & Neck  [x]  Functional []  Flexed    []  Extended []  Rotated right  []  Rotated left []  Laterally flexed right []  Laterally flexed left []  Cervical hyperextension   [x]  Good head control []  Adequate head control []  Limited head control []  Absent head control Describe tone/movement of head and neck:      Lower Extremity Measurements: LE ROM:  Active ROM Right 02/05/2022 Left 02/05/2022  Hip flexion 90 limited by soft tissue in sitting 90 limited by soft tissue in sitting  Hip extension    Hip abduction    Hip adduction    Knee flexion 110 110  Knee extension 0 0  Ankle dorsiflexion 10 10  Ankle plantarflexion WFL WFL   (Blank rows = not tested)  LE MMT:  MMT Right  02/05/2022 Left 02/05/2022  Hip flexion 4/5 4/5  Hip extension    Hip abduction    Hip adduction    Knee flexion 5/5 5/5  Knee extension 5/5 5/5  Ankle dorsiflexion 5/5 5/5  Ankle plantarflexion     (Blank rows = not tested)  Hip positions:  [x]  Neutral   []  Abducted   []  Adducted  []  Subluxed   []  Dislocated   []  Fixed   []  Tendency away from neutral []  Flexible []  Self-correction []  External correction   Hip Windswept:[x]  Neutral  []  Right    []  Left  []  Subluxed   []  Dislocated   []  Fixed   []  Tendency away from neutral []  Flexible []  Self-correction []  External correction  LE Tone: [x]  Normal []  Low tone []  Spasticity []   Flaccid []  Dystonia []  Rocks/Extends at hip []  Thrust into knee extension []  Pushes legs downward into footrest  Foot positioning: ROM Concerns:NONE Dorsiflexed: []  Right   []  Left Plantar flexed: []  Right    []  Left Inversion: []  Right    []  Left Eversion: []  Right    []  Left  LE Edema: [x]  1+ (Barely detectable impression when finger is pressed into skin) []  2+ (slight indentation. 15 seconds to rebound) []  3+ (deeper indentation. 30 seconds to rebound) []  4+ (>30 seconds to rebound)  UE Measurements:  UPPER EXTREMITY ROM:   Active ROM Right 02/05/2022 Left 02/05/2022  Shoulder extension 40 40  Shoulder flexion 130 135  Shoulder abduction    Shoulder adduction    Elbow flexion WNL WNL  Elbow extension WNL WNL  Wrist flexion WNL WNL  Wrist extension WNL WNL  (Blank rows = not tested)  UPPER EXTREMITY MMT:  MMT Right 02/05/2022 Left 02/05/2022  Shoulder flexion 5/5 5/5  Shoulder abduction 5/5 5/5  Shoulder extension 5/5 5/5  Shoulder adduction    Elbow flexion 5/5 5/5  Elbow extension 5/5 5/5  Wrist flexion 5/5 5/5  Wrist extension 5/5 5/5  Pinch strength    Grip strength    (Blank rows = not tested)  Shoulder Posture:  Right Tendency towards Left  []   Functional []    []   Elevation []    []   Depression []    [x]   Protraction [x]    []   Retraction []    []   Internal rotation []    []   External rotation []    []   Subluxed []     UE Tone: [x]  Normal []  Flaccid []  Low tone []  Spasticity  []  Dystonia []  Other:   UE Edema: [x]  1+ (Barely detectable impression when finger is pressed into skin) []  2+ (slight indentation. 15 seconds to rebound) []  3+ (deeper indentation. 30 seconds to rebound) []  4+ (>30 seconds to rebound)  Wrist/Hand: Handedness: []  Right   []  Left   []  NA: Comments:  Right  Left  [x]   WNL [x]    []   Limitations []    []   Contractures []    []   Fisting []    []   Tremors []    []   Weak grasp []    []   Poor dexterity []    []   Hand  movement non functional []    []   Paralysis []         MOBILITY BASE RECOMMENDATIONS and JUSTIFICATION:  MOBILITY BASE  JUSTIFICATION   Manufacturer:   [employer] mobility Model:          Catalyst 4    (Heavy Duty)  Color: Retro red Seat Width:  22" Seat Depth 20"    Manual mobility base (continue below)    Scooter/POV   Power mobility base   Number of hours per day spent in above selected mobility base: 12+  Typical daily mobility base use Schedule: all day    is not a safe, functional ambulator   limitation prevents from completing a MRADL(s) within a reasonable time frame     limitation places at high risk of morbidity or mortality secondary to  the attempts to perform a    MRADL(s)   limitation prevents accomplishing a MRADL(s) entirely   provide independent mobility   equipment is a lifetime medical need   walker or cane inadequate   any type manual wheelchair      inadequate   scooter/POV inadequate       requires dependent mobility          MANUAL MOBILITY       Standard manual wheelchair  K0001      Arm:     both  right   left      Foot:    both  right    left   self-propels wheelchair   will use on regular basis   chair fits throughout home   willing and motivated to use   propels with assistance      dependent use    Standard hemi-manual wheelchair  K0002      Arm:     both  right   left      Foot:    both  right    left   lower seat height required to foot      propel   short stature   self-propels wheelchair   will use on regular basis   chair fits throughout home   willing and motivated to use    propels with assistance   dependent use    Lightweight manual wheelchair  K0003      Arm:     both  right   left      Foot:    both   right   left                    hemi height required   medical condition and weight of  wheelchair affect  ability to self      propel standard manual wheelchair in the residence   can and does self-propel (marginal propulsion skills)   daily use _________hours   chair fits throughout home   willing and motivated to use   lower seat height required to foot propel   short stature    High strength lightweight manual  wheelchair   K0004     Arm:     both  right   left     Foot:    both  right    left                                                                   hemi height required  medical condition and weight of wheelchair affect ability to self propel while engaging in frequent MRADL(s) that cannot be performed in a standard or lightweight manual wheelchair    daily use ______12+___hours   chair fits throughout home   willing and motivated to use   prevent repetitive use injuries    lower seat height required to foot propel   short stature     Ultra-lightweight manual wheelchair  K0005     Arm:     both  right   left     Foot:    both  right   left        hemi height required   heavy duty    Front seat to floor _____ inches      Rear seat to floor _____ inches      Back height _____ inches     Back angle ______ degrees      Front angle _____ degrees    full-time manual wheelchair user   Requires individualized fitting and optimal adjustments for multiple features that include adjustable axle configuration, fully adjustable center of gravity, wheel camber, seat and back angle, angle of seat slope, which cannot be accommodated by a K0001 through K0004 manual wheelchair   prevent repetitive use injuries   daily use_________hours    user has high activity patterns that frequently require  them  to go out into the community for the purpose of independently accomplishing high level MRADL activities. Examples of these might include a combination of; shopping, work, school, Photographer, childcare, independently  loading and unloading from a vehicle etc.   lower seat height required to foot propel   short stature   heavy duty -  weight over 250lbs    Current chair is a K0005   manufacture:___________________  model:_________________  serial#____________________  age:_________     First time [id number] user (complete trial)  K0004 time and # of strokes to propel 30 feet: ________seconds _________strokes  U0454 time and # of strokes to propel 30 feet: ________seconds _________strokes  What was the result of the trial between the K0004 and K0005 manual wheelchair? ___    What features of the K0005 w/c are needed as compared to the K0004 base? Why?___     adjustable seat and back angle changes the angle of seat slope of the frame to attain a gravity assisted position for efficient propulsion and proper weight distribution along the frame      the front of the wheelchair will be configured higher than the back of the chair to allow gravity to assist the user with postural stability   the center of the wheel will be positioned for stability, safety and efficient propulsion   adjustable axle allows for vertical, horizontal, camber and overall width changes  throughout the wheels for adjustment of the client's exact needs and abilities.    adjustable axle increases the stability and function of the chair allowing for adjustment of the center of gravity.    accommodates the client's anatomical position in the chair maximizing independence in mobility and maneuverability in all environments.    create a minimal fixed tilt-in space to assist in positioning.    Describe users full-time manual wheelchair activity patterns:___     Power assist Comments:   prevent repetitive use injuries   repetitive strain injury present in    shoulder girdle     shoulder pain is (> or =) to 7/10     during manual propulsion       Current Pain _____/10   requires conservation of energy to  participate in MRADL(s) runable to propel up ramps or curbs using manual wheelchair    been K0005 user greater than one year   user unwilling to use power      wheelchair (reason):  less expensive option to power   wheelchair    rim activated power assist -      decreased strength    Heavy duty manual wheelchair       K0006     Arm:     both  right   left     Foot:    both  right   left      hemi height required     Dependent base   user exceeds 250lbs   non-functional ambulator     extreme spasticity   over active movement    broken frame/hx of repeated     repairs   able to self-propel in residence        lower seat to floor height required   unable to self-propel in residence    Extra heavy duty manual wheelchair  K0007     Arm:     both  right   left     Foot:    both  right   left      hemi height required   Dependent base   user exceeds 300lbs   non-functional ambulator     able to self-propel in residence    lower seat to floor height required   unable to self-propel in residence      Manual wheelchair with tilt [phone]      (Manual "Tilt-n-Space")   patient is dependent for transfers   patient requires frequent       positioning for pressure relief    patient requires frequent      positioning for poor/absent trunk control         Stroller Base   infant/child    unable to propel manual      wheelchair   allows for growth   non-functional ambulator   non-functional UE   independent mobility is not a goal at this time    MANUAL FRAME OPTIONS      Push handles   extended  rangle adjustable    standard   caregiver access   caregiver assist     allows "hooking" to enable      increased ability to perform ADLs or maintain balance    Angle Adjustable Back   postural control   control of tone/spasticity   accommodation of range of motion   UE functional  control   accommodation for seating system    Rear wheel placement   std/fixed fully adjustableramputee    camber ________degree   removable rear wheel   non-removable rear wheel  Wheel size __24"____  Wheel style____Mag___________________   improved UE access to wheels   increase propulsion ability   improved stability   changing angle in space for      improvement of postural stability   remove for transport     allow for seating system to fit on      base   amputee placement   1-arm drive access   r R  r L   enable propulsion of manual       wheelchair with one arm     amputee placement   Wheel rims/ Hand rims   Standard     Specialized-____  provide ability to propel manual    increase self-propulsion with hand wheelchair weakness/decreased grasp      Spoke protector/guard     prevent hands from getting caught in spokes   Tires:  [x]  pneumatic  [x]  flat free inserts  []  solid  Style:  [x]  decrease roll resistance              [x]  prevent frequent flats  [x]  increase shock absorbency  [x]  decrease maintenance   [x]  decrease pain from road shock    [x]  decrease spasms from road shock    Wheel Locks:    [x]  push []  pull []  scissor  [x]  lock wheels for transfers  [x]  lock wheels from rolling   Brake/wheel lock extension:  [x]  R  [x]  L  [x]  allow user to operate wheel locks due to decreased reach or strength   Caster housing:  Caster size:                      Style:                                          []  suspension fork  []  maneuverability   []  stability of wheelchair   []  durability  []  maintenance  []  angle adjustment for posture  []  allow for feet to come under        wheelchair base  []  allows change in seat to floor      height   []  increase shock absorbency  []  decrease pain from road shock  []  decrease spasms from road    shock   []  Side guards  []  prevent clothing getting caught in      wheel or becoming  soiled  rprovide hip and pelvic stability  []  eliminates contact between body and wheels  []  limit hand contact with wheels   [x]  Anti-tippers      [x]  prevent wheelchair from tipping    backward  [x]  assist caregiver with curbs     POWER MOBILITY      []  Scooter/POV    []  can safely operate   []  can safely transfer   []  has adequate trunk stability   []  cannot functionally propel  manual wheelchair    []  Power mobility base    []  non-ambulatory   []  cannot functionally propel manual wheelchair   []  cannot functionally and safely      operate scooter/POV  []  can safely operate power       wheelchair  []  home is accessible  []  willing to use power wheelchair     Tilt  []  Powered tilt on powered chair  []  Powered tilt on manual chair []  Manual tilt on manual chair Comments:  []  change position for pressure      []  elief/cannot weight shift   []  change position against      gravitational force on head and      shoulders   []  decrease pain  []  blood pressure management   []  control autonomic dysreflexia  []  decrease respiratory distress  []  management of spasticity  []  management of low tone  []  facilitate postural control   []  rest periods   []  control edema  []  increase sitting tolerance   []  aid with transfers     Recline   []  Power recline on power chair  []  Manual recline on manual chair  Comments:    []  intermittent catheterization  []  manage spasticity  []  accommodate femur to back angle  []   change position for pressure relief/cannot weight shift rhigh risk of pressure sore development   tilt alone does not accomplish     effective pressure relief, maximum pressure relief achieved at -     _______ degrees tilt       _______ degrees recline    difficult to transfer to and from bed  rest periods and sleeping in chair   repositioning for transfers   bring to full recline for ADL care   clothing/diaper changes in chair   gravity PEG tube feeding    head positioning   decrease pain   blood pressure management    control autonomic dysreflexia   decrease respiratory distress   user on ventilator     Elevator on mobility base   Power wheelchair   Scooter   increase Indep in transfers    increase Indep in ADLs     bathroom function and safety   kitchen/cooking function and safety   shopping   raise height for communication at standing level   raise height for eye contact which reduces cervical neck strain and pain   drive at raised height for safety and navigating crowds   Other:    Vertical position system (anterior tilt)     (Drive locks-out)     Stand       (Drive enabled)   independent weight bearing   decrease joint contractures   decrease/manage spasticity   decrease/manage spasms   pressure distribution away from   scapula, sacrum, coccyx, and ischial tuberosity   increase digestion and elimination    access to counters and cabinets   increase reach   increase interaction with others at eye level, reduces neck strain   increase performance of       MRADL(s)      Power elevating legrest     Center mount (Single) 85-170 degrees        Standard (Pair) 100-170 degrees   position legs at 90 degrees, not available with std power ELR   center mount tucks into chair to decrease turning radius in home, not available with std power ELR   provide change in position for LE   elevate legs during recline     maintain placement of feet on      footplate   decrease edema   improve circulation   actuator needed to elevate legrest   actuator needed to articulate legrest preventing knees from flexing   Increase ground clearance over      curbs    STD (pair) independently                     elevate legrest   POWER WHEELCHAIR CONTROLS      Controls/input device   Expandable   Non-expandable   Proportional   Right Hand  Left Hand    Non-proportional/switches/head-array   Electrical/proximity           Mechanical      Manufacturer:___________________   Type:________________________  provides access for controlling wheelchair   programming for accurate control   progressive disease/changing condition   required for alternative drive      controls        lacks motor control to operate  proportional drive control   unable to understand proportional controls   limited movement/strength   extraneous movement / tremors / ataxic / spastic        Upgraded electronics      controller/harness      Single power (tilt or recline)   []  Expandable    []  Non-expandable plus   []  Multi-power (tilt, recline, power legrest, power seat lift, vertical positioning system, stand)  []  allows input device to communicate with drive motors  []  harness provides necessary     connections between the controller, input device, and seat functions     []  needed in order to operate   power seat functions through    joystick/ input device  []  required for alternative drive      controls     []  Enhanced display  []  required to connect all alternative drive controls   []  required for upgraded joystick      (lite-throw, heavy duty, micro)  []  Allows user to see in which mode and drive the wheelchair is set; necessary for alternate controls       []  Upgraded tracking electronics  []  correct tracking when on uneven surfaces makes switch driving more efficient and less fatiguing  []  increase safety when driving  []  increase ability to traverse      thresholds    []  Safety / reset / mode switches     Type:    []  Used to change modes and stop the wheelchair when driving     []  Mount for joystick / input device/switches  []  swing away for access or      transfers   []  attaches joystick / input device / switches to wheelchair   []  provides for consistent access  []  midline for optimal placement    []  Attendant controlled  joystick plus     mount  []  safety  []  long distance driving  []  operation of seat functions  []  compliance with transportation regulations    []  Battery  []  required to power (power assist / scooter/ power wc / other):       []  Power inverter (24V to 12V)  []  required for ventilator / respiratory equipment / other:     CHAIR OPTIONS MANUAL & POWER      Armrests   [x]  adjustable height [x] removable  []  swing away []  fixed  [x]  flip back  []  reclining  []  full length pads [x]  desk []  tube arms []  gel pads  [x]  provide support with elbow at 90    [x]  remove/flip back/swing away for  transfers  [x]  provide support and positioning of upper body    [x]  allow to come closer to table top  [x]  remove for access to tables  []  provide support for w/c tray  [x]  change of height/angles for       variable activities   []  Elbow support / Elbow stop  []  keep elbow positioned on arm pad  []  keep arms from falling off arm pad      during tilt and/or recline   Upper Extremity Support  []  Arm trough  []   R  []   L  Style:  []  swivel mount []  fixed mount   []  posterior hand support  []   tray  []  full tray  []  joystick cut out  []   R  []   L  Style:  []  decrease gravitational pull on      shoulders  []  provide support to increase UE  function  []  provide hand support in natural    position  []  position flaccid UE  []  decrease subluxation    rdecrease edema       []  manage spasticity   []  provide  midline positioning   provide work surface   placement for AAC/ Computer/ EADL       Hangers/ Legrests    ______ degree   Elevating  articulating   swing away  fixed  lift off   heavy duty  adjustable knee angle   adjustable calf panel    longer extension tube               provide LE support   maintain placement of feet on      footplate    accommodate lower leg length   accommodate to hamstring       tightness   enable transfers   provide change in position  for LE's   elevate legs during recline     decrease edema   durability      Foot support    footplate  R  L  flip up            Depth adjustable    angle adjustable   foot board/one piece     provide foot support   accommodate to ankle ROM   allow foot to go under wheelchair base   enable transfers      Shoe holders   position foot     decrease / manage spasticity   control position of LE   stability     safety      Ankle strap/heel      loops   support foot on foot support   decrease extraneous movement   provide input to heel    protect foot      Amputee adapter  R   L     Style:                  Size:   Provide support for stump/residual extremity     Transportation tie-down   to provide crash tested tie-down brackets     Crutch/cane holder     O2 holder     IV hanger    Ventilator tray/mount     stabilize accessory on wheelchair       Component  Justification      Seat cushion     General use  accommodate impaired sensation   decubitus ulcers present or history   unable to shift weight   increase pressure distribution   prevent pelvic extension   custom required "off-the-shelf"    seat cushion will not accommodate deformity   stabilize/promote pelvis alignment   stabilize/promote femur alignment   accommodate obliquity   accommodate multiple deformity   incontinent/accidents   low maintenance      seat mounts                  fixed  removable   attach seat platform/cushion to wheelchair frame     Seat wedge     provide increased aggressiveness of seat shape to decrease sliding  down in the seat   accommodate ROM         Cover replacement    protect back or seat cushion   incontinent/accidents     Solid seat / insert     support cushion to prevent      hammocking   allows attachment of cushion to mobility base     Lateral  pelvic/thigh/hip     support (Guides)      decrease abduction   accommodate pelvis   position upper legs   accommodate spasticity   removable  for transfers      Lateral pelvic/thigh      supports mounts   fixed    swing-away    removable   mounts lateral pelvic/thigh supports      mounts lateral pelvic/thigh supports swing-away or removable for transfers     Medial thigh support ([place]mel)  rdecrease adduction raccommodate ROM  rremove for transfers    ralignment       Medial thigh    fixed      support mounts       swing-away    removable   mounts medial thigh supports    Mounts medial supports swing- away or removable for transfers        Component  Justification    Back        provide posterior trunk support  facilitate tone   provide lumbar/sacral support  accommodate deformity   support trunk in midline    custom required "off-the-shelf" back support will not accommodate deformity    provide lateral trunk support  accommodate or decrease tone             Back mounts   fixed   removable   attach back rest/cushion to wheelchair frame    Lateral trunk      supports   R  L   decrease lateral trunk leaning   accommodate asymmetry     contour for increased contact   safety     control of tone     Lateral trunk      supports mounts   fixed   swing-away    removable   mounts lateral trunk supports      Mounts lateral trunk supports swing-away or removable for transfers    Anterior chest      strap, vest      decrease forward movement of shoulder   decrease forward movement of trunk   safety/stability   added abdominal support   trunk alignment   assistance with shoulder control    decrease shoulder elevation     Headrest       provide posterior head support   provide posterior neck support   provide lateral head support   provide anterior head support    support during tilt and recline   improve feeding      improve respiration   placement of switches   safety     accommodate ROM    accommodate tone   improve visual orientation    Headrest            fixed  removable  flip down      Mounting hardware    swing-away laterals/switches   mount headrest    mounts headrest flip down or  removable for transfers   mount headrest swing-away laterals    mount switches      Neck Support     decrease neck rotation   decrease forward neck flexion   Pelvic Positioner     std hip belt           padded hip belt   dual pull hip belt   four point hip belt   stabilize tone   decrease falling out of chair   prevent excessive extension   special pull angle to control      rotation   pad for protection over boney      prominence   promote comfort     Essential needs  bag/pouch   []  medicines []  special food rorthotics []  clothing changes []  diapers   []  catheter/hygiene []  ostomy supplies   The above equipment has a life- long use expectancy.  Growth and changes in medical and/or functional conditions would be the exceptions.   SUMMARY:  Why mobility device was selected; include why a lower level device is not appropriate:   ASSESSMENT:  CLINICAL IMPRESSION: Patient is a 66 y.o. female who was seen today for physical therapy evaluation and treatment for wheelchair evaluation due to increased fall risk and decreased endurance due to moderate to severe low back pain and LE pain which prevents her from prolonged standing, walking, and transfers. Patient will benefit from manual wheelchair to improve functional mobility within her house and community to improve independence and reduce caregiver assistance..    OBJECTIVE IMPAIRMENTS Abnormal gait, decreased activity tolerance, decreased balance, decreased endurance, decreased mobility, difficulty walking, decreased strength, decreased  safety awareness, impaired flexibility, postural dysfunction, obesity, and pain.   ACTIVITY LIMITATIONS carrying, lifting, bending, standing, squatting, stairs, transfers, and caring for others  PARTICIPATION LIMITATIONS: meal prep, cleaning, laundry, shopping, community activity, and yard work  PERSONAL FACTORS Age, Past/current experiences, and Time since onset of injury/illness/exacerbation are also affecting patient's functional outcome.   REHAB POTENTIAL: Fair    CLINICAL DECISION MAKING: Stable/uncomplicated  EVALUATION COMPLEXITY: Low                                   GOALS: One time visit. No goals established.    PLAN: PT FREQUENCY: one time visit    Ileana Ladd, PT 02/05/2022, 12:29 PM    I concur with the above findings and recommendations of the therapist:  Physician name printed:         Physician's signature:      Date:

## 2022-02-05 NOTE — Telephone Encounter (Signed)
Called, no answer.  Left VM that I would call again to clarify.

## 2022-02-06 DIAGNOSIS — I10 Essential (primary) hypertension: Secondary | ICD-10-CM | POA: Diagnosis not present

## 2022-02-06 DIAGNOSIS — M1990 Unspecified osteoarthritis, unspecified site: Secondary | ICD-10-CM | POA: Diagnosis not present

## 2022-02-06 DIAGNOSIS — R2689 Other abnormalities of gait and mobility: Secondary | ICD-10-CM | POA: Diagnosis not present

## 2022-02-06 DIAGNOSIS — J069 Acute upper respiratory infection, unspecified: Secondary | ICD-10-CM | POA: Diagnosis not present

## 2022-02-06 DIAGNOSIS — R7309 Other abnormal glucose: Secondary | ICD-10-CM | POA: Diagnosis not present

## 2022-02-06 DIAGNOSIS — R296 Repeated falls: Secondary | ICD-10-CM | POA: Diagnosis not present

## 2022-02-06 DIAGNOSIS — R2681 Unsteadiness on feet: Secondary | ICD-10-CM | POA: Diagnosis not present

## 2022-02-06 NOTE — Telephone Encounter (Signed)
Called patient. New PT coming today to evaluate her back pain and expand treatment. She needs new Calf high TED hose.  We agreed that I would write script and she would pick up in the [facility]. She had no further needs.

## 2022-02-08 DIAGNOSIS — R7309 Other abnormal glucose: Secondary | ICD-10-CM | POA: Diagnosis not present

## 2022-02-08 DIAGNOSIS — M1990 Unspecified osteoarthritis, unspecified site: Secondary | ICD-10-CM | POA: Diagnosis not present

## 2022-02-08 DIAGNOSIS — J069 Acute upper respiratory infection, unspecified: Secondary | ICD-10-CM | POA: Diagnosis not present

## 2022-02-08 DIAGNOSIS — I10 Essential (primary) hypertension: Secondary | ICD-10-CM | POA: Diagnosis not present

## 2022-02-08 DIAGNOSIS — R2681 Unsteadiness on feet: Secondary | ICD-10-CM | POA: Diagnosis not present

## 2022-02-08 DIAGNOSIS — R2689 Other abnormalities of gait and mobility: Secondary | ICD-10-CM | POA: Diagnosis not present

## 2022-02-08 DIAGNOSIS — R296 Repeated falls: Secondary | ICD-10-CM | POA: Diagnosis not present

## 2022-02-19 ENCOUNTER — Encounter: Payer: Self-pay | Admitting: Family Medicine

## 2022-02-19 ENCOUNTER — Ambulatory Visit (INDEPENDENT_AMBULATORY_CARE_PROVIDER_SITE_OTHER): Payer: Medicare Other | Admitting: Family Medicine

## 2022-02-19 DIAGNOSIS — F32A Depression, unspecified: Secondary | ICD-10-CM | POA: Diagnosis not present

## 2022-02-19 DIAGNOSIS — E039 Hypothyroidism, unspecified: Secondary | ICD-10-CM | POA: Diagnosis not present

## 2022-02-19 DIAGNOSIS — I10 Essential (primary) hypertension: Secondary | ICD-10-CM | POA: Diagnosis not present

## 2022-02-19 DIAGNOSIS — E876 Hypokalemia: Secondary | ICD-10-CM | POA: Diagnosis not present

## 2022-02-19 DIAGNOSIS — R296 Repeated falls: Secondary | ICD-10-CM | POA: Diagnosis not present

## 2022-02-19 NOTE — Patient Instructions (Signed)
I disagree with the advice you were given.  You and Judie Grieve are good for one another. I will call with the blood test results. Keep on top of the wheelchair order.  I believe it should be coming.

## 2022-02-20 ENCOUNTER — Encounter: Payer: Self-pay | Admitting: Family Medicine

## 2022-02-20 LAB — BASIC METABOLIC PANEL
BUN/Creatinine Ratio: 9 — ABNORMAL LOW (ref 12–28)
BUN: 8 mg/dL (ref 8–27)
CO2: 25 mmol/L (ref 20–29)
Calcium: 9.2 mg/dL (ref 8.7–10.3)
Chloride: 103 mmol/L (ref 96–106)
Creatinine, Ser: 0.85 mg/dL (ref 0.57–1.00)
Glucose: 94 mg/dL (ref 70–99)
Potassium: 3.4 mmol/L — ABNORMAL LOW (ref 3.5–5.2)
Sodium: 139 mmol/L (ref 134–144)
eGFR: 75 mL/min/{1.73_m2} (ref >59)

## 2022-02-20 LAB — TSH: TSH: 3.29 u[IU]/mL (ref 0.450–4.500)

## 2022-02-20 MED ORDER — POTASSIUM CHLORIDE CRYS ER 20 MEQ PO TBCR
20.0000 meq | EXTENDED_RELEASE_TABLET | Freq: Every day | ORAL | 3 refills | Status: DC
Start: 2022-02-20 — End: 2023-06-17

## 2022-02-20 NOTE — Assessment & Plan Note (Signed)
No change for now.  May need to DC HCTZ if lowish BPs continue.  Renal function normal.

## 2022-02-20 NOTE — Assessment & Plan Note (Signed)
Getting stronger with PT.  Continue to slowly increase activity.

## 2022-02-20 NOTE — Progress Notes (Signed)
    SUBJECTIVE:   CHIEF COMPLAINT / HPI:   FU over treated hypertension.  Now on HCTZ 12.5 mg.  Much less lightheaded.  No falls.  Generalized weakness: Doing better in PT.  She is getting stronger.  No falls  Depression is worse.  She was triggered when her adult son, Judie Grieve, was told by his neurologist with Britta Mccreedy in the room that they needed to spend less time together.  I am not sure what led up to this comment.  With [name]'s intellectual disability, they have always been close, especially since the death of Clairissa's husband/[name]'s father.  Ms Saidi felt demeaned by this comment    OBJECTIVE:   BP 124/60 (BP Location: Left Arm, Cuff Size: Large)   Pulse 74   Ht 5\' 11"  (1.803 m)   Wt 300 lb (136.1 kg)   SpO2 98%   BMI 41.84 kg/m   BP noted.  Initial BP was high and my repeat was still lowish.   Lungs clear Cardiac RRR without m or g  ASSESSMENT/PLAN:   HYPERTENSION, BENIGN SYSTEMIC No change for now.  May need to DC HCTZ if lowish BPs continue.  Renal function normal.  Hypokalemia Low K again on BMP.  Increase KCL to 20 meq daily.  Frequent falls Getting stronger with PT.  Continue to slowly increase activity.  Depression Worse.  She really took the neurologist's comment personally.  I reassured her that her relationship with is healthy.     Judie Grieve, MD [facility]

## 2022-02-20 NOTE — Assessment & Plan Note (Signed)
Worse.  She really took the neurologist's comment personally.  I reassured her that her relationship with Judie Grieve is healthy.

## 2022-02-20 NOTE — Assessment & Plan Note (Signed)
Low K again on BMP.  Increase KCL to 20 meq daily.

## 2022-02-24 ENCOUNTER — Other Ambulatory Visit: Payer: Self-pay | Admitting: Family Medicine

## 2022-02-24 DIAGNOSIS — M5441 Lumbago with sciatica, right side: Secondary | ICD-10-CM

## 2022-02-27 ENCOUNTER — Telehealth: Payer: Self-pay

## 2022-02-27 ENCOUNTER — Ambulatory Visit (INDEPENDENT_AMBULATORY_CARE_PROVIDER_SITE_OTHER): Payer: Medicare Other | Admitting: Allergy & Immunology

## 2022-02-27 VITALS — BP 130/68 | HR 84 | Temp 97.9°F | Resp 18 | Ht 71.0 in | Wt 297.5 lb

## 2022-02-27 DIAGNOSIS — J4540 Moderate persistent asthma, uncomplicated: Secondary | ICD-10-CM

## 2022-02-27 DIAGNOSIS — J310 Chronic rhinitis: Secondary | ICD-10-CM

## 2022-02-27 NOTE — Patient Instructions (Addendum)
1. Moderate persistent asthma, uncomplicated - Lung testing was in the 30% range, but it improved to above 50% with the albuterol treatment. - This points towards a diagnosis of asthma, but does not necessarily rule it in. - We are going to start you on a daily medication called Trelegy (which contains a long acting albuterol combined with an inhaled steroid and a third medication to keep your lungs loose). - Daily controller medication(s): Trelegy 100/62.5/25 one puff once daily - Prior to physical activity: albuterol 2 puffs 10-15 minutes before physical activity. - Rescue medications: albuterol 4 puffs every 4-6 hours as needed and albuterol nebulizer one vial every 4-6 hours as needed - Asthma control goals:  * Full participation in all desired activities (may need albuterol before activity) * Albuterol use two time or less a week on average (not counting use with activity) * Cough interfering with sleep two time or less a month * Oral steroids no more than once a year * No hospitalizations  2. Chronic rhinitis - We are going to get labs to look for environmental allergies. - Continue with the Allegra daily as you are doing. - We will call you in 1-2 weeks with the results of the testing.   3. Return in about 6 weeks (around 04/10/2022). You can overbook with Judie Grieve if this works better.    Please inform us of any Emergency Department visits, hospitalizations, or changes in symptoms. Call us before going to the ED for breathing or allergy symptoms since we might be able to fit you in for a sick visit. Feel free to contact us anytime with any questions, problems, or concerns.  It was a pleasure to see you and your family again today!  Websites that have reliable patient information: 1. American Academy of Asthma, Allergy, and Immunology: www.aaaai.org 2. Food Allergy Research and Education (FARE): foodallergy.org 3. Mothers of Asthmatics: http://www.asthmacommunitynetwork.org 4.  American College of Allergy, Asthma, and Immunology: www.acaai.org   COVID-19 Vaccine Information can be found at: [email or web address] For questions related to vaccine distribution or appointments, please email [email or web address] or call [phone].   We realize that you might be concerned about having an allergic reaction to the COVID19 vaccines. To help with that concern, WE ARE OFFERING THE COVID19 VACCINES IN OUR OFFICE! Ask the front desk for dates!     "Like" [place] on Facebook and Instagram for our latest updates!      A healthy democracy works best when [employer] participate! Make sure you are registered to vote! If you have moved or changed any of your contact information, you will need to get this updated before voting!  In some cases, you MAY be able to register to vote online: [email or web address]

## 2022-02-27 NOTE — Telephone Encounter (Addendum)
Patient calls nurse line (yesterday) checking the status of wheelchair order.   Patient stated, "I am not sure if Dr. Leveda Anna has received the paperwork yet."   PCP is on vacation and unsure if [employer] order was sent over or signed off on.   Will call [employer] to clarify. Will have order form sent over if needed.

## 2022-02-27 NOTE — Progress Notes (Signed)
NEW PATIENT  Date of Service/Encounter:  02/27/22  Consult requested by: [name], MD   Assessment:   Moderate persistent asthma, uncomplicated - with marked reversibility today (starting Trelegy one puff once daily)  Chronic rhinitis - getting blood work today  Complicated past medical history including hypertension, diabetes, hyperlipidemia, as well as multiple musculoskeletal injuries leading to chronic pain  Plan/Recommendations:   1. Moderate persistent asthma, uncomplicated - Lung testing was in the 30% range, but it improved to above 50% with the albuterol treatment. - This points towards a diagnosis of asthma, but does not necessarily rule it in. - We are going to start you on a daily medication called Trelegy (which contains a long acting albuterol combined with an inhaled steroid and a third medication to keep your lungs loose). - Daily controller medication(s): Trelegy 100/62.5/25 one puff once daily - Prior to physical activity: albuterol 2 puffs 10-15 minutes before physical activity. - Rescue medications: albuterol 4 puffs every 4-6 hours as needed and albuterol nebulizer one vial every 4-6 hours as needed - Asthma control goals:  * Full participation in all desired activities (may need albuterol before activity) * Albuterol use two time or less a week on average (not counting use with activity) * Cough interfering with sleep two time or less a month * Oral steroids no more than once a year * No hospitalizations  2. Chronic rhinitis - We are going to get labs to look for environmental allergies. - Continue with the Allegra daily as you are doing. - We will call you in 1-2 weeks with the results of the testing.   3. Return in about 6 weeks (around 04/10/2022). You can overbook with Judie Grieve if this works better.    This note in its entirety was forwarded to the Provider who requested this consultation.  Subjective:   Brianna Wheeler is a 66 y.o. female  presenting today for evaluation of  Chief Complaint  Patient presents with   Establish Care   Cough    CATERA KISSLER has a history of the following: Patient Active Problem List   Diagnosis Date Noted   Frequent falls 09/13/2021   Hypokalemia 07/28/2021   Morbid obesity (HCC) 12/08/2020   Adjustment reaction with mixed emotional features 09/12/2020   Mass of thoracic structure 02/09/2020   Fall as cause of accidental injury at home as place of occurrence 01/13/2020   Serum gammaglobulin increased 02/11/2019   Chronic venous insufficiency 01/28/2019   Nocturia 01/28/2019   Intractable acute post-traumatic headache 12/11/2018   GERD (gastroesophageal reflux disease) 05/01/2016   Vertebral compression fracture (HCC) 12/09/2015   Metatarsalgia of both feet 09/20/2015   Allergic rhinitis 08/30/2015   Transaminitis 04/29/2015   Cataract 01/01/2013   Back pain 06/10/2012   Colon cancer screening 02/11/2012   Asthma 12/07/2011   Depression 05/10/2011   Chronic sinusitis 09/20/2010   Osteoarthritis 04/07/2009   Hypothyroidism 03/03/2008   Lumbar back pain with radiculopathy affecting right lower extremity 10/03/2007   Prediabetes 05/08/2007   HLD (hyperlipidemia) 09/19/2006   HYPERTENSION, BENIGN SYSTEMIC 09/19/2006   Diaphragmatic hernia 09/19/2006    History obtained from: chart review and patient and her son.   [name] was referred by [name], MD.     Larcenia is a 66 y.o. female presenting for an evaluation of a cough . The cough is gone now.    Asthma/Respiratory Symptom History: She has been an asthmatic with allergies since she was around  years. She did see Dr. Keitha Butte before he left to start his own practice. She is now on albuterol. Triggers include wood smoke especially the [employer]. She has Ventolin that she takes during the day as needed.  She has not gotten prednisone for her symptoms.  Hospitalized for her breathing.  She does  have some prednisone courses prescribed in the distant past, but they are prescribed by a number of different providers and it is hard to tease out why she got them.  She also has a significant history of chronic back pain and other injuries, so that might be why she got those prednisone courses. It does look like she got prednisone in June 2023 for breathing.  Allergic Rhinitis Symptom History: She has Allegra that she uses as needed.  She has never been tested in the past. She does not get sinus infections or ear infections frequently.  Review of her past meds show that she got Augmentin in April 2022 and Keflex in January 2023.  Otherwise there were several amoxicillin and azithromycin courses from 2012 through 2015.  She had a compression fracture from a hole in her floor. She is not going to have surgery because there is too high of a risk of becoming paralyzed. Her landlord is finally getting that fixed.   She is having some issues with her tires on her car. Apparently there is a leak but no one can find it.   Otherwise, there is no history of other atopic diseases, including food allergies, drug allergies, stinging insect allergies, eczema, urticaria, or contact dermatitis. There is no significant infectious history. Vaccinations are up to date.    Past Medical History: Patient Active Problem List   Diagnosis Date Noted   Frequent falls 09/13/2021   Hypokalemia 07/28/2021   Morbid obesity (HCC) 12/08/2020   Adjustment reaction with mixed emotional features 09/12/2020   Mass of thoracic structure 02/09/2020   Fall as cause of accidental injury at home as place of occurrence 01/13/2020   Serum gammaglobulin increased 02/11/2019   Chronic venous insufficiency 01/28/2019   Nocturia 01/28/2019   Intractable acute post-traumatic headache 12/11/2018   GERD (gastroesophageal reflux disease) 05/01/2016   Vertebral compression fracture (HCC) 12/09/2015   Metatarsalgia of both feet 09/20/2015    Allergic rhinitis 08/30/2015   Transaminitis 04/29/2015   Cataract 01/01/2013   Back pain 06/10/2012   Colon cancer screening 02/11/2012   Asthma 12/07/2011   Depression 05/10/2011   Chronic sinusitis 09/20/2010   Osteoarthritis 04/07/2009   Hypothyroidism 03/03/2008   Lumbar back pain with radiculopathy affecting right lower extremity 10/03/2007   Prediabetes 05/08/2007   HLD (hyperlipidemia) 09/19/2006   HYPERTENSION, BENIGN SYSTEMIC 09/19/2006   Diaphragmatic hernia 09/19/2006    Medication List:  Allergies as of 02/27/2022       Reactions   Hydrocodone-acetaminophen    REACTION: Vomited and had toruble breathing in 2009   Nsaids    Makes sick on her stomach   Oxycodone-acetaminophen Other (See Comments)   Pt hallucinates with this med   Tetracycline    REACTION: nausea   Bactrim [sulfamethoxazole-trimethoprim] Rash        Medication List        Accurate as of February 27, 2022 11:59 PM. If you have any questions, ask your nurse or doctor.          albuterol 108 (90 Base) MCG/ACT inhaler Commonly known as: VENTOLIN HFA Inhale 2 puffs into the lungs every 6 (six) hours as needed  for wheezing or shortness of breath.   aspirin EC 81 MG tablet Take 81 mg by mouth daily.   baclofen 10 MG tablet Commonly known as: LIORESAL Take 1 tablet (10 mg total) by mouth 3 (three) times daily as needed for muscle spasms.   CALCIUM 1000 + D PO Take by mouth.   diclofenac Sodium 1 % Gel Commonly known as: VOLTAREN Apply 2 g topically 4 (four) times daily.   EPINEPHrine 0.3 mg/0.3 mL Devi Commonly known as: EPI-PEN Inject 0.3 mLs (0.3 mg total) into the muscle once.   fexofenadine 180 MG tablet Commonly known as: ALLEGRA Take 180 mg by mouth daily.   fluticasone 50 MCG/ACT nasal spray Commonly known as: FLONASE Place 2 sprays into both nostrils daily. Place 2 sprays in each nostril daily   gabapentin 100 MG capsule Commonly known as: NEURONTIN Take 1 capsule  (100 mg total) by mouth 3 (three) times daily.   hydrochlorothiazide 12.5 MG tablet Commonly known as: HYDRODIURIL Take 1 tablet (12.5 mg total) by mouth daily.   levothyroxine 88 MCG tablet Commonly known as: SYNTHROID Take 1 tablet by mouth once daily   lidocaine 4 % Commonly known as: HM Lidocaine Patch Apply 1 patch topically daily.   multivitamin tablet Take 1 tablet by mouth daily.   omeprazole 20 MG capsule Commonly known as: PRILOSEC Take 1 capsule by mouth once daily   potassium chloride SA 20 MEQ tablet Commonly known as: Klor-Con M20 Take 1 tablet (20 mEq total) by mouth daily.   rosuvastatin 20 MG tablet Commonly known as: CRESTOR Take 1 tablet by mouth once daily   traMADol 50 MG tablet Commonly known as: ULTRAM Take 1 tablet by mouth three times daily as needed        Birth History: non-contributory  Developmental History: non-contributory  Past Surgical History: Past Surgical History:  Procedure Laterality Date   EYE SURGERY     LUMBAR DISC SURGERY     TUBAL LIGATION       Family History: Family History  Problem Relation Age of Onset   Cancer Sister        brain   Diabetes Sister    Heart disease Sister    Heart attack Sister    Epilepsy Son      Social History: Kristl lives at home with her son.  They live in a house that was remodeled in 1955.  There is carpeting throughout the home.  She has oil heating and central cooling.  There are dust mite covers on the bed as well as the pillows.  There is no tobacco exposure.  She is currently on disability.  She is not exposed to fumes, chemicals, or dust.  They do not use a HEPA filter in the home.  They do not live near an interstate or industrial area.   Review of Systems  Constitutional: Negative.  Negative for fever, malaise/fatigue and weight loss.  HENT: Negative.  Negative for congestion, ear discharge and ear pain.   Eyes:  Negative for pain, discharge and redness.  Respiratory:   Positive for cough, sputum production and shortness of breath. Negative for wheezing.   Cardiovascular: Negative.  Negative for chest pain and palpitations.  Gastrointestinal:  Negative for abdominal pain, heartburn, nausea and vomiting.  Skin: Negative.  Negative for itching and rash.  Neurological:  Negative for dizziness and headaches.  Endo/Heme/Allergies:  Positive for environmental allergies. Does not bruise/bleed easily.       Objective:  Blood pressure 130/68, pulse 84, temperature 97.9 F (36.6 C), resp. rate 18, height 5\' 11"  (1.803 m), weight 297 lb 8 oz (134.9 kg), SpO2 95 %. Body mass index is 41.49 kg/m.     Physical Exam Vitals reviewed.  Constitutional:      Appearance: She is well-developed. She is obese. She is not ill-appearing or toxic-appearing.     Comments: Using a walker.  HENT:     Head: Normocephalic and atraumatic.     Right Ear: Tympanic membrane, ear canal and external ear normal. No drainage, swelling or tenderness. Tympanic membrane is not injected, scarred, erythematous, retracted or bulging.     Left Ear: Tympanic membrane, ear canal and external ear normal. No drainage, swelling or tenderness. Tympanic membrane is not injected, scarred, erythematous, retracted or bulging.     Nose: No nasal deformity, septal deviation, mucosal edema or rhinorrhea.     Right Turbinates: Enlarged, swollen and pale.     Left Turbinates: Enlarged, swollen and pale.     Right Sinus: No maxillary sinus tenderness or frontal sinus tenderness.     Left Sinus: No maxillary sinus tenderness or frontal sinus tenderness.     Comments: No nasal polyps.    Mouth/Throat:     Mouth: Mucous membranes are not pale and not dry.     Pharynx: Uvula midline.  Eyes:     General:        Right eye: No discharge.        Left eye: No discharge.     Conjunctiva/sclera: Conjunctivae normal.     Right eye: Right conjunctiva is not injected. No chemosis.    Left eye: Left conjunctiva  is not injected. No chemosis.    Pupils: Pupils are equal, round, and reactive to light.  Cardiovascular:     Rate and Rhythm: Normal rate and regular rhythm.     Heart sounds: Normal heart sounds.  Pulmonary:     Effort: Pulmonary effort is normal. No tachypnea, accessory muscle usage or respiratory distress.     Breath sounds: Normal breath sounds. No wheezing, rhonchi or rales.     Comments: Coarse upper airway sounds bilaterally. Chest:     Chest wall: No tenderness.  Abdominal:     Tenderness: There is no abdominal tenderness. There is no guarding or rebound.  Lymphadenopathy:     Head:     Right side of head: No submandibular, tonsillar or occipital adenopathy.     Left side of head: No submandibular, tonsillar or occipital adenopathy.     Cervical: No cervical adenopathy.  Skin:    Coloration: Skin is not pale.     Findings: No abrasion, erythema, petechiae or rash. Rash is not papular, urticarial or vesicular.  Neurological:     Mental Status: She is alert.  Psychiatric:        Behavior: Behavior is cooperative.      Diagnostic studies:    Spirometry: results abnormal (FEV1: 1.08/38%, FVC: 1.34/36%, FEV1/FVC: 81%).    Spirometry consistent with possible restrictive disease. Xopenex four puffs via MDI treatment given in clinic with significant improvement in FEV1 and FVC per ATS criteria.  Allergy Studies: labs sent instead           Salvatore Marvel, MD All[facility]

## 2022-03-01 ENCOUNTER — Encounter: Payer: Self-pay | Admitting: Allergy & Immunology

## 2022-03-03 ENCOUNTER — Other Ambulatory Visit: Payer: Self-pay | Admitting: Family Medicine

## 2022-03-14 DIAGNOSIS — Z1211 Encounter for screening for malignant neoplasm of colon: Secondary | ICD-10-CM | POA: Diagnosis not present

## 2022-03-14 DIAGNOSIS — R296 Repeated falls: Secondary | ICD-10-CM | POA: Diagnosis not present

## 2022-03-16 ENCOUNTER — Telehealth: Payer: Self-pay

## 2022-03-16 LAB — CBC WITH DIFFERENTIAL/PLATELET
Basophils Absolute: 0.1 10*3/uL (ref 0.0–0.2)
Basos: 2 %
EOS (ABSOLUTE): 0.1 10*3/uL (ref 0.0–0.4)
Eos: 4 %
Hematocrit: 40 % (ref 34.0–46.6)
Hemoglobin: 13.4 g/dL (ref 11.1–15.9)
Immature Grans (Abs): 0 10*3/uL (ref 0.0–0.1)
Immature Granulocytes: 0 %
Lymphocytes Absolute: 1.2 10*3/uL (ref 0.7–3.1)
Lymphs: 42 %
MCH: 31 pg (ref 26.6–33.0)
MCHC: 33.5 g/dL (ref 31.5–35.7)
MCV: 93 fL (ref 79–97)
Monocytes Absolute: 0.4 10*3/uL (ref 0.1–0.9)
Monocytes: 13 %
Neutrophils Absolute: 1.2 10*3/uL — ABNORMAL LOW (ref 1.4–7.0)
Neutrophils: 39 %
Platelets: 132 10*3/uL — ABNORMAL LOW (ref 150–450)
RBC: 4.32 x10E6/uL (ref 3.77–5.28)
RDW: 15.2 % (ref 11.7–15.4)
WBC: 3 10*3/uL — ABNORMAL LOW (ref 3.4–10.8)

## 2022-03-16 LAB — ALLERGEN PROFILE, MOLD
Aureobasidi Pullulans IgE: <0.1 kU/L
Candida Albicans IgE: <0.1 kU/L
M009-IgE Fusarium proliferatum: <0.1 kU/L
M014-IgE Epicoccum purpur: <0.1 kU/L
Mucor Racemosus IgE: <0.1 kU/L
Phoma Betae IgE: <0.1 kU/L
Setomelanomma Rostrat: <0.1 kU/L
Stemphylium Herbarum IgE: <0.1 kU/L

## 2022-03-16 LAB — ALLERGENS W/COMP RFLX AREA 2
Alternaria Alternata IgE: <0.1 kU/L
Aspergillus Fumigatus IgE: <0.1 kU/L
Bermuda Grass IgE: <0.1 kU/L
Cedar, Mountain IgE: <0.1 kU/L
Cladosporium Herbarum IgE: <0.1 kU/L
Cockroach, German IgE: <0.1 kU/L
Common Silver Birch IgE: <0.1 kU/L
Cottonwood IgE: <0.1 kU/L
D Farinae IgE: <0.1 kU/L
D Pteronyssinus IgE: <0.1 kU/L
E001-IgE Cat Dander: <0.1 kU/L
E005-IgE Dog Dander: <0.1 kU/L
Elm, American IgE: <0.1 kU/L
IgE (Immunoglobulin E), Serum: 15 IU/mL (ref 6–495)
Johnson Grass IgE: <0.1 kU/L
Maple/Box Elder IgE: <0.1 kU/L
Mouse Urine IgE: <0.1 kU/L
Oak, White IgE: <0.1 kU/L
Pecan, Hickory IgE: <0.1 kU/L
Penicillium Chrysogen IgE: <0.1 kU/L
Pigweed, Rough IgE: <0.1 kU/L
Ragweed, Short IgE: <0.1 kU/L
Sheep Sorrel IgE Qn: <0.1 kU/L
Timothy Grass IgE: <0.1 kU/L
White Mulberry IgE: <0.1 kU/L

## 2022-03-16 LAB — ANCA TITERS
Atypical pANCA: <1:20 {titer}
C-ANCA: <1:20 {titer}
P-ANCA: <1:20 {titer}

## 2022-03-16 LAB — ALPHA-1-ANTITRYPSIN: A-1 Antitrypsin: 126 mg/dL (ref 101–187)

## 2022-03-16 LAB — ASPERGILLUS PRECIPITINS
A.Fumigatus #1 Abs: NEGATIVE
Aspergillus Flavus Antibodies: NEGATIVE
Aspergillus Niger Antibodies: NEGATIVE
Aspergillus glaucus IgG: NEGATIVE
Aspergillus nidulans IgG: NEGATIVE
Aspergillus terreus IgG: NEGATIVE

## 2022-03-16 NOTE — Telephone Encounter (Signed)
Patient calls nurse line regarding dry cough, runny nose, sinus pressure and post nasal drip. She recently brought son into the office with the same symptoms.   She denies fever or body aches. I asked her if they had tested for COVID. She states that they do not have COVID and that she has not tested.   Provided with supportive measures. She is requesting prescription for tessalon. Will forward to PCP.   Please advise.   Veronda Prude, RN

## 2022-03-17 MED ORDER — BENZONATATE 100 MG PO CAPS: 100.0000 mg | ORAL_CAPSULE | Freq: Two times a day (BID) | ORAL | 0 refills | Status: DC | PRN

## 2022-03-17 NOTE — Telephone Encounter (Signed)
Called and confirmed cough.  No fever or SOB.  Rx sent as requested.

## 2022-03-19 NOTE — Telephone Encounter (Signed)
Confirmed wheelchair has been delivered.

## 2022-03-19 NOTE — Telephone Encounter (Signed)
VM left for Brianna Wheeler at [facility].   Will await her return call.

## 2022-04-05 ENCOUNTER — Other Ambulatory Visit: Payer: Self-pay

## 2022-04-05 ENCOUNTER — Encounter: Payer: Self-pay | Admitting: Family Medicine

## 2022-04-05 ENCOUNTER — Ambulatory Visit (INDEPENDENT_AMBULATORY_CARE_PROVIDER_SITE_OTHER): Payer: Medicare Other | Admitting: Family Medicine

## 2022-04-05 DIAGNOSIS — R519 Headache, unspecified: Secondary | ICD-10-CM | POA: Diagnosis not present

## 2022-04-05 NOTE — Patient Instructions (Addendum)
Please bring ALL of your medications with you to every visit.   Today we talked about:  You can use heat on your head to help with the pain but it should improve over the next couple weeks. If it is not improving, please give the clinic a call and we can talk about doing imaging of your head. If you have severely worsening symptoms, changes in your vision or feelings of confusion please go the ER to be evaluated.  Please follow up as needed with your PCP  Thank you for choosing [facility].   Please call [phone] with any questions about today's appointment.  Please be sure to schedule follow up at the front  desk before you leave today.   Elberta Fortis, DO Family Medicine

## 2022-04-05 NOTE — Assessment & Plan Note (Signed)
Headache x 3 weeks that is slowly improving. Likely d/t post-concussive state vs tension HA. Less likely differential includes cranial bleed. Patient not on anticoagulation. Used shared decision making and opted to not pursue imaging at this time. Continue symptomatic management with Tylenol and heat. Advised patient of emergent red flag symptoms. Marland Kitchen

## 2022-04-05 NOTE — Progress Notes (Cosign Needed Addendum)
    SUBJECTIVE:   CHIEF COMPLAINT / HPI:   Fall Patient states she fell out of her bed 3 weeks ago and hit her head on a wood dresser. Patient states she lost her balance on a poorly placed mattress and fell backwards. States she did not lose consciousness but called EMS. She was evaluated and opted not to go the hospital. Patient states she has had headaches for the past 3 weeks that have progressively getting better. She takes Tylenol and Tramadol and it helps with the pain. Denies N/V, vision changes, dizziness or any focal deficits.  PERTINENT  PMH / PSH: h/o falls and concussions  OBJECTIVE:   BP 130/73   Pulse 73   Wt 299 lb 8 oz (135.9 kg)   SpO2 99%   BMI 41.77 kg/m    General: NAD, able to participate in exam Cardiac: RRR, no murmurs. Respiratory: CTAB, normal effort, No wheezes, rales or rhonchi Abdomen: Bowel sounds present, nontender, nondistended Extremities: no edema or cyanosis. Skin: quarter sized area of edema on posterior scalp. No erythema Neuro: AOx4. CN intact. Motor and sensation intact globally. Uses a walker for ambulation. Gait unsteady. Psych: Normal affect and mood  ASSESSMENT/PLAN:   Headache Headache x 3 weeks that is slowly improving. Likely d/t post-concussive state vs tension HA. Less likely differential includes cranial bleed. Patient not on anticoagulation. Used shared decision making and opted to not pursue imaging at this time. Continue symptomatic management with Tylenol and heat. Advised patient of emergent red flag symptoms. .     Dr. Elberta Fortis, DO [facility]

## 2022-04-09 ENCOUNTER — Ambulatory Visit (INDEPENDENT_AMBULATORY_CARE_PROVIDER_SITE_OTHER): Payer: Medicare Other

## 2022-04-09 DIAGNOSIS — Z23 Encounter for immunization: Secondary | ICD-10-CM | POA: Diagnosis not present

## 2022-04-10 ENCOUNTER — Ambulatory Visit: Admitting: Allergy & Immunology

## 2022-04-14 DIAGNOSIS — Z1211 Encounter for screening for malignant neoplasm of colon: Secondary | ICD-10-CM | POA: Diagnosis not present

## 2022-04-14 DIAGNOSIS — R296 Repeated falls: Secondary | ICD-10-CM | POA: Diagnosis not present

## 2022-04-17 ENCOUNTER — Telehealth: Payer: Self-pay

## 2022-04-17 NOTE — Telephone Encounter (Signed)
Patient calls nurse line requesting clarification on HCTZ dosing.   Patient reports she went to her pharmacy to pick up the medication, however she was told there are two prescriptions. One for 12.5mg  and one for 25mg .   I asked patient what she has been taking and she stated, "I don't know."   Will forward to PCP for clarification before I call the pharmacy.

## 2022-04-17 NOTE — Telephone Encounter (Signed)
Called pharmacy and 25mg  was dispensed to patient today from an old prescription from December.   Advised to take off patients profile as she is currently supposed to be taking 12.5mg .   Called patient and advised her to cut 25mg  in half.   Patient appreciative.

## 2022-05-04 ENCOUNTER — Telehealth: Payer: Self-pay | Admitting: Student

## 2022-05-04 NOTE — Telephone Encounter (Signed)
**  After Hours/ Emergency Line Call**  Received a call to report that Duwayne Heck.  Endorsing felt her back pop the other day. Feels knots in her back. Has been doing baclofen, tramadol Denying leg weakness acutely, no saddle anesthesia, no incontinence.  Recommended that she try supportive measures with heat and stretching.  Red flags discussed.  Will forward to PCP.  Gerrit Heck, MD  PGY-2, [facility] 05/04/2022 5:40 PM

## 2022-05-08 ENCOUNTER — Other Ambulatory Visit: Payer: Self-pay | Admitting: Family Medicine

## 2022-05-08 DIAGNOSIS — M5441 Lumbago with sciatica, right side: Secondary | ICD-10-CM

## 2022-05-11 ENCOUNTER — Other Ambulatory Visit: Payer: Self-pay | Admitting: Family Medicine

## 2022-05-14 DIAGNOSIS — Z1211 Encounter for screening for malignant neoplasm of colon: Secondary | ICD-10-CM | POA: Diagnosis not present

## 2022-05-14 DIAGNOSIS — R296 Repeated falls: Secondary | ICD-10-CM | POA: Diagnosis not present

## 2022-05-15 ENCOUNTER — Other Ambulatory Visit: Payer: Self-pay

## 2022-05-15 MED ORDER — DICLOFENAC SODIUM 1 % EX GEL: 2.0000 g | Freq: Four times a day (QID) | CUTANEOUS | 6 refills | Status: DC

## 2022-05-29 ENCOUNTER — Other Ambulatory Visit: Payer: Self-pay | Admitting: Family Medicine

## 2022-06-14 DIAGNOSIS — Z1211 Encounter for screening for malignant neoplasm of colon: Secondary | ICD-10-CM | POA: Diagnosis not present

## 2022-06-14 DIAGNOSIS — R296 Repeated falls: Secondary | ICD-10-CM | POA: Diagnosis not present

## 2022-06-28 ENCOUNTER — Ambulatory Visit (INDEPENDENT_AMBULATORY_CARE_PROVIDER_SITE_OTHER): Payer: Medicare Other | Admitting: Family Medicine

## 2022-06-28 ENCOUNTER — Encounter: Payer: Self-pay | Admitting: Family Medicine

## 2022-06-28 ENCOUNTER — Other Ambulatory Visit: Payer: Self-pay

## 2022-06-28 VITALS — BP 120/68 | HR 71 | Ht 71.0 in | Wt 301.8 lb

## 2022-06-28 DIAGNOSIS — M5416 Radiculopathy, lumbar region: Secondary | ICD-10-CM

## 2022-06-28 DIAGNOSIS — K219 Gastro-esophageal reflux disease without esophagitis: Secondary | ICD-10-CM | POA: Diagnosis not present

## 2022-06-28 MED ORDER — GABAPENTIN 100 MG PO CAPS: 100.0000 mg | ORAL_CAPSULE | Freq: Three times a day (TID) | ORAL | 3 refills | Status: DC

## 2022-06-28 MED ORDER — OMEPRAZOLE 20 MG PO CPDR: 20.0000 mg | DELAYED_RELEASE_CAPSULE | Freq: Every day | ORAL | 3 refills | Status: DC

## 2022-06-28 NOTE — Patient Instructions (Signed)
I refilled your omeprazole. It is OK to take the tramadol twice a day. Try taking the gabapentin regularly every day, not just when the sciatica is flared up.

## 2022-06-29 ENCOUNTER — Encounter: Payer: Self-pay | Admitting: Family Medicine

## 2022-06-29 NOTE — Progress Notes (Signed)
    SUBJECTIVE:   CHIEF COMPLAINT / HPI:   Chronic low back pain with radiation down right leg.  Pain limits activity.  She is also fearful of falling and does not get outside much.  Tramadol helps - currently taking one or two per day.  She has also been taking her gabapentin only when the sciatica is at its worse.  Had advanced imaging of spine in 01/2020.  Reviewed.      OBJECTIVE:   BP 120/68   Pulse 71   Ht 5\' 11"  (1.803 m)   Wt (!) 301 lb 12.8 oz (136.9 kg)   SpO2 97%   BMI 42.09 kg/m   Uses walker, mostly for rising out of chair and stability.  Gait is normal. Walks moderately bent forward.  Slow to rise from chair.  Grossly normal strength and sensation.  ASSESSMENT/PLAN:   Lumbar back pain with radiculopathy affecting right lower extremity Main strategy is to take tramadol morning and evening scheduled with the mid day dose being as needed. Also take the gabapentin regularly, not just prn.   FU 6-8 weeks to see if this strategy is helping.     , MD [facility]

## 2022-06-29 NOTE — Assessment & Plan Note (Signed)
Main strategy is to take tramadol morning and evening scheduled with the mid day dose being as needed. Also take the gabapentin regularly, not just prn.   FU 6-8 weeks to see if this strategy is helping.

## 2022-07-06 ENCOUNTER — Telehealth: Payer: Self-pay

## 2022-07-06 NOTE — Telephone Encounter (Signed)
Patient calls nurse line report back pain.   She reports she was bending over yesterday cutting flowers and woke up this morning to back pain. She reports her pain level is 6/10. She reports she has been taking Tramadol and Gabapentin with some relief.   Patient advised to rest and use a heating pad as needed. If no improvement over the weekend advised clinic visit.   Patient agreed with plan.

## 2022-07-14 DIAGNOSIS — R296 Repeated falls: Secondary | ICD-10-CM | POA: Diagnosis not present

## 2022-07-14 DIAGNOSIS — Z1211 Encounter for screening for malignant neoplasm of colon: Secondary | ICD-10-CM | POA: Diagnosis not present

## 2022-08-08 ENCOUNTER — Other Ambulatory Visit: Payer: Self-pay | Admitting: Family Medicine

## 2022-08-08 DIAGNOSIS — E785 Hyperlipidemia, unspecified: Secondary | ICD-10-CM

## 2022-08-14 DIAGNOSIS — Z1211 Encounter for screening for malignant neoplasm of colon: Secondary | ICD-10-CM | POA: Diagnosis not present

## 2022-08-14 DIAGNOSIS — R296 Repeated falls: Secondary | ICD-10-CM | POA: Diagnosis not present

## 2022-08-28 ENCOUNTER — Encounter: Payer: Self-pay | Admitting: Family Medicine

## 2022-09-07 ENCOUNTER — Ambulatory Visit (INDEPENDENT_AMBULATORY_CARE_PROVIDER_SITE_OTHER): Payer: Medicare Other

## 2022-09-07 DIAGNOSIS — Z0000 Encounter for general adult medical examination without abnormal findings: Secondary | ICD-10-CM

## 2022-09-07 NOTE — Patient Instructions (Signed)

## 2022-09-07 NOTE — Progress Notes (Signed)
I connected with  Duwayne Heck on 09/07/22 by a audio enabled telemedicine application and verified that I am speaking with the correct person using two identifiers.  Patient Location: Home  Provider Location: Office/Clinic  I discussed the limitations of evaluation and management by telemedicine. The patient expressed understanding and agreed to proceed.  Subjective:   Brianna Wheeler is a 67 y.o. female who presents for Medicare Annual (Subsequent) preventive examination.  Review of Systems    Per HPI unless specifically indicated below.  Cardiac Risk Factors include: advanced age (>89mn, >>79women);female gender, Hypertension, and Hyperlipidemia.          Objective:       06/28/2022   10:55 AM 04/05/2022    3:15 PM 02/27/2022    2:04 PM  Vitals with BMI  Height 5' 11"$   5' 11"$   Weight 301 lbs 13 oz 299 lbs 8 oz 297 lbs 8 oz  BMI [id number] Systolic [id number] Diastolic 68 73 68  Pulse 71 73 84    Today's Vitals   09/07/22 1331  PainSc: 4    There is no height or weight on file to calculate BMI.     09/07/2022    1:42 PM 04/05/2022    3:19 PM 02/19/2022   10:44 AM 02/05/2022   11:25 AM 12/25/2021    2:40 PM 12/19/2021    3:39 PM 09/13/2021    1:28 PM  Advanced Directives  Does Patient Have a Medical Advance Directive? No No No No No No No  Would patient like information on creating a medical advance directive? No - Patient declined No - Patient declined No - Patient declined No - Patient declined No - Patient declined No - Patient declined No - Patient declined    Current Medications (verified) Outpatient Encounter Medications as of 09/07/2022  Medication Sig   albuterol (VENTOLIN HFA) 108 (90 Base) MCG/ACT inhaler Inhale 2 puffs into the lungs every 6 (six) hours as needed for wheezing or shortness of breath.   aspirin 81 MG EC tablet Take 81 mg by mouth daily.     baclofen (LIORESAL) 10 MG tablet Take 1 tablet (10 mg total) by mouth 3 (three) times daily as  needed for muscle spasms.   benzonatate (TESSALON) 100 MG capsule Take 1 capsule (100 mg total) by mouth 2 (two) times daily as needed for cough.   Calcium Carb-Cholecalciferol (CALCIUM 1000 + D PO) Take by mouth.   diclofenac Sodium (VOLTAREN) 1 % GEL Apply 2 g topically 4 (four) times daily.   EPINEPHrine (EPI-PEN) 0.3 mg/0.3 mL DEVI Inject 0.3 mLs (0.3 mg total) into the muscle once.   fexofenadine (ALLEGRA) 180 MG tablet Take 180 mg by mouth daily.     fluticasone (FLONASE) 50 MCG/ACT nasal spray Place 2 sprays into both nostrils daily. Place 2 sprays in each nostril daily   gabapentin (NEURONTIN) 100 MG capsule Take 1 capsule (100 mg total) by mouth 3 (three) times daily.   hydrochlorothiazide (HYDRODIURIL) 12.5 MG tablet Take 1 tablet (12.5 mg total) by mouth daily.   levothyroxine (SYNTHROID) 88 MCG tablet Take 1 tablet by mouth once daily   Multiple Vitamin (MULTIVITAMIN) tablet Take 1 tablet by mouth daily.   omeprazole (PRILOSEC) 20 MG capsule Take 1 capsule (20 mg total) by mouth daily.   potassium chloride SA (KLOR-CON M20) 20 MEQ tablet Take 1 tablet (20 mEq total) by mouth daily.   rosuvastatin (CRESTOR) 20 MG tablet  Take 1 tablet by mouth once daily   traMADol (ULTRAM) 50 MG tablet Take 1 tablet (50 mg total) by mouth 3 (three) times daily as needed.   Lidocaine (HM LIDOCAINE PATCH) 4 % PTCH Apply 1 patch topically daily. (Patient not taking: Reported on 09/07/2022)   No facility-administered encounter medications on file as of 09/07/2022.    Allergies (verified) Hydrocodone-acetaminophen, Nsaids, Oxycodone-acetaminophen, Tetracycline, and Bactrim [sulfamethoxazole-trimethoprim]   History: Past Medical History:  Diagnosis Date   Diabetes mellitus    Hyperlipidemia    Hypertension    Left foot pain 2005   Chronic injury -- multiple broken bones and torn ligaments after accident in garage   Past Surgical History:  Procedure Laterality Date   EYE SURGERY     LUMBAR DISC  SURGERY     TUBAL LIGATION     Family History  Problem Relation Age of Onset   Cancer Sister        brain   Diabetes Sister    Heart disease Sister    Heart attack Sister    Epilepsy Son    Social History   Socioeconomic History   Marital status: Widowed    Spouse name: Not on file   Number of children: 1   Years of education: 11.5   Highest education level: 11th grade  Occupational History   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    Son suffers from [place]. [facility] patient- Hokulani Mahady.)   Patient has her own vehicle and still drives .   Hobbies: cleaning house, likes home and garden things. Spending time with friends.      Social Determinants of Health   Financial Resource Strain: Low Risk  (09/07/2022)   Overall Financial Resource Strain (CARDIA)    Difficulty of Paying Living Expenses: Not hard at all  Food Insecurity: No Food Insecurity (09/07/2022)   Hunger Vital Sign    Worried About Running Out of Food in the Last Year: Never true    Ran Out of Food in the Last Year: Never true  Transportation Needs: No Transportation Needs (09/07/2022)   PRAPARE - Hydrologist (Medical): No    Lack of Transportation (Non-Medical): No  Physical Activity: Insufficiently Active (09/07/2022)   Exercise Vital Sign    Days of Exercise per Week: 6 days    Minutes of Exercise per Session: 20 min  Stress: No Stress Concern Present (09/07/2022)   [place]    Feeling of Stress : Not at all  Social Connections: Moderately Integrated (09/07/2022)   Social  Connection and Isolation Panel [NHANES]    Frequency of Communication with Friends and Family: More than three times a week    Frequency of Social Gatherings with Friends and Family: Three times a week    Attends Religious Services: More than 4 times per year    Active Member of Clubs or Organizations: Yes    Attends Archivist Meetings: Not on file    Marital Status: Widowed    Tobacco Counseling Counseling given: No  Clinical Intake:  Pre-visit preparation completed: No  Pain : 0-10 Pain Score: 4  Pain Type: Chronic pain Pain Location: Back Pain Descriptors / Indicators: Aching     Nutritional Status: BMI > 30  Obese Nutritional Risks: None Diabetes: No  How often do you need to have someone help you when you read instructions, pamphlets, or other written materials from your doctor or pharmacy?: 1 - Never  Diabetic?No  Interpreter Needed?: No  Information entered by :: Donnie Mesa, CMA   Activities of Daily Living    09/07/2022    1:29 PM  In your present state of health, do you have any difficulty performing the following activities:  Hearing? 0  Vision? 1  Difficulty concentrating or making decisions? 0  Walking or climbing stairs? 1  Dressing or bathing? 1  Doing errands, shopping? 0    Patient Care Team: Kinnie Feil, MD as PCP - General (Family Medicine) Katy Apo, MD (Ophthalmology) Feliz Beam, MD as Referring Physician (Ophthalmology) Valentina Shaggy, MD as Consulting Physician (Allergy and Immunology)  Indicate any recent Medical Services you may have received from other than [facility] providers in the past year (date may be approximate).     Assessment:   This is a routine wellness examination for [place].   Hearing/Vision screen Denies any hearing issues. Denies any change to her vision. Bi Annual Eye Exam Katy Apo, MD, The pt will schedule an appt this year for an Eye Exam.  Dietary issues and exercise  activities discussed: Current Exercise Habits: Structured exercise class, Type of exercise: strength training/weights, Time (Minutes): 25, Frequency (Times/Week): 6, Weekly Exercise (Minutes/Week): 150, Intensity: Moderate, Exercise limited by: orthopedic condition(s)   Goals Addressed   None    Depression Screen    09/07/2022    1:27 PM 06/28/2022   11:04 AM 04/05/2022    3:16 PM 02/19/2022   10:44 AM 01/22/2022   11:40 AM 12/25/2021    2:39 PM 12/19/2021    3:38 PM  PHQ 2/9 Scores  PHQ - 2 Score 1 2 0 2 0 0 0  PHQ- 9 Score  5 1 3 1 $ 0 1    Fall Risk    09/07/2022    1:27 PM 06/28/2022   10:58 AM 04/05/2022    3:16 PM 02/19/2022   10:44 AM 12/25/2021    2:39 PM  Fall Risk   Falls in the past year? 1 1 0 0 1  Number falls in past yr: 1 1 0  0  Injury with Fall? 1  0  0  Risk for fall due to : Impaired balance/gait;Impaired mobility      Follow up Falls evaluation completed        FALL RISK PREVENTION PERTAINING TO THE HOME:  Any stairs in or around the home? Yes  If so, are there any without handrails? No  Home free of loose throw rugs in walkways, pet beds, electrical cords, etc? Yes  Adequate lighting in your home to reduce risk of falls? Yes   ASSISTIVE DEVICES UTILIZED TO PREVENT FALLS:  Life alert? No  Use of a cane, walker or w/c? Yes  Grab bars in the bathroom? Yes  Shower chair or bench in shower? Yes  Elevated toilet seat or a handicapped toilet? Yes   TIMED UP AND GO:  Was the test performed? Unable to perform, virtual appointment    Cognitive Function:    05/27/2018    2:59 PM 09/24/2013  4:00 PM 03/18/2013   10:00 AM 02/12/2012    4:00 PM  MMSE - Mini Mental State Exam  Orientation to time 5 5 5 5  $ Orientation to Place 5 5 5 5  $ Registration 3 3 3 3  $ Attention/ Calculation 5 5 5 5  $ Recall 3 2 2 3  $ Language- name 2 objects 2 2 2 2  $ Language- repeat 1 1 1 1  $ Language- follow 3 step command 3 3 3 3  $ Language- read & follow direction 1 1 1 1  $ Write a  sentence 1 1 1 1  $ Copy design 1 1 1 1  $ Total score 30 29 29 30        $ 09/07/2022    1:33 PM 08/02/2021    3:50 PM 05/27/2018    2:55 PM  6CIT Screen  What Year? 0 points 0 points 0 points  What month? 0 points 0 points 0 points  What time? 0 points 0 points 0 points  Count back from 20 0 points 0 points 0 points  Months in reverse 0 points 0 points 0 points  Repeat phrase 2 points 0 points 0 points  Total Score 2 points 0 points 0 points    Immunizations Immunization History  Administered Date(s) Administered   Fluad Quad(high Dose 65+) 04/03/2021, 04/09/2022   Influenza Split 04/23/2011, 04/17/2012   Influenza Whole 04/23/2007, 04/23/2008, 04/28/2009, 04/25/2010   Influenza,inj,Quad PF,6+ Mos 03/19/2013, 04/01/2014, 03/31/2015, 05/01/2016, 04/02/2017, 03/25/2018, 03/19/2019, 04/20/2020   PFIZER Comirnaty(Gray Top)Covid-19 Tri-Sucrose Vaccine 12/08/2020   PFIZER(Purple Top)SARS-COV-2 Vaccination 04/20/2020, 05/12/2020   Pfizer Covid-19 Vaccine Bivalent Booster 61yr & up 09/13/2021   Pneumococcal Conjugate-13 01/12/2020   Pneumococcal Polysaccharide-23 05/10/2011   Td 10/22/2003   Tdap 12/31/2014    TDAP status: Up to date  Flu Vaccine status: Up to date  Pneumococcal vaccine status: Up to date  Covid-19 vaccine status: Information provided on how to obtain vaccines.   Qualifies for Shingles Vaccine? Yes   Zostavax completed No   Shingrix Completed?: No.    Education has been provided regarding the importance of this vaccine. Patient has been advised to call insurance company to determine out of pocket expense if they have not yet received this vaccine. Advised may also receive vaccine at local pharmacy or Health Dept. Verbalized acceptance and understanding.  Screening Tests Health Maintenance  Topic Date Due   Zoster Vaccines- Shingrix (1 of 2) Never done   COVID-19 Vaccine (5 - 2023-24 season) 03/23/2022   LIPID PANEL  07/27/2022   COLONOSCOPY (Pts 45-449yr Insurance coverage will need to be confirmed)  09/08/2023 (Originally 06/24/2001)   MAMMOGRAM  01/15/2023   Medicare Annual Wellness (AWV)  09/08/2023   DTaP/Tdap/Td (3 - Td or Tdap) 12/30/2024   Pneumonia Vaccine 6570Years old (3 of 3 - PPSV23 or PCV20) 01/11/2025   INFLUENZA VACCINE  Completed   DEXA SCAN  Completed   Hepatitis C Screening  Completed   HPV VACCINES  Aged Out    Health Maintenance  Health Maintenance Due  Topic Date Due   Zoster Vaccines- Shingrix (1 of 2) Never done   COVID-19 Vaccine (5 - 2023-24 season) 03/23/2022   LIPID PANEL  07/27/2022   Colorectal cancer screening: ? 02/11/2012, ordered cologuard 09/13/2021. The pt state    Mammogram status: Completed 01/14/2021. Repeat every year  DEXA Scan: 01/19/2016  Lung Cancer Screening: (Low Dose CT Chest recommended if Age 69-80ears, 30 pack-year currently smoking OR have quit w/in 15years.) does not  qualify.   Lung Cancer Screening Referral: not applicable   Additional Screening:  Hepatitis C Screening: does qualify; Completed 04/29/2015  Vision Screening: Recommended annual ophthalmology exams for early detection of glaucoma and other disorders of the eye. Is the patient up to date with their annual eye exam?  No Who is the provider or what is the name of the office in which the patient attends annual eye exams? Bi Annual Eye Exam Katy Apo, MD, The pt will schedule an appt this year for an Eye Exam. If pt is not established with a provider, would they like to be referred to a provider to establish care? No .   Dental Screening: Recommended annual dental exams for proper oral hygiene  Community Resource Referral / Chronic Care Management: CRR required this visit?  No   CCM required this visit?  No      Plan:     I have personally reviewed and noted the following in the patient's chart:   Medical and social history Use of alcohol, tobacco or illicit drugs  Current medications and supplements  including opioid prescriptions.  Functional ability and status Nutritional status Physical activity Advanced directives List of other physicians Hospitalizations, surgeries, and ER visits in previous 12 months Vitals Screenings to include cognitive, depression, and falls Referrals and appointments  In addition, I have reviewed and discussed with patient certain preventive protocols, quality metrics, and best practice recommendations. A written personalized care plan for preventive services as well as general preventive health recommendations were provided to patient.     Ms. Meeker , Thank you for taking time to come for your Medicare Wellness Visit. I appreciate your ongoing commitment to your health goals. Please review the following plan we discussed and let me know if I can assist you in the future.   These are the goals we discussed:  Goals       Acknowledge receipt of Advanced Directive package     Education provided to patient.      Weight (lb) < 250 lb (113.4 kg)        This is a list of the screening recommended for you and due dates:  Health Maintenance  Topic Date Due   Zoster (Shingles) Vaccine (1 of 2) Never done   COVID-19 Vaccine (5 - 2023-24 season) 03/23/2022   Lipid (cholesterol) test  07/27/2022   Colon Cancer Screening  09/08/2023*   Mammogram  01/15/2023   Medicare Annual Wellness Visit  09/08/2023   DTaP/Tdap/Td vaccine (3 - Td or Tdap) 12/30/2024   Pneumonia Vaccine (3 of 3 - PPSV23 or PCV20) 01/11/2025   Flu Shot  Completed   DEXA scan (bone density measurement)  Completed   Hepatitis C Screening: USPSTF Recommendation to screen - Ages 73-79 yo.  Completed   HPV Vaccine  Aged Out  *Topic was postponed. The date shown is not the original due date.    Wilson Singer, CMA   09/07/2022   Nurse Notes: Approximately 30 minute Non-Face -To-Face Medicare Wellness Visit

## 2022-09-11 ENCOUNTER — Ambulatory Visit (INDEPENDENT_AMBULATORY_CARE_PROVIDER_SITE_OTHER): Payer: Medicare Other | Admitting: Allergy & Immunology

## 2022-09-11 ENCOUNTER — Encounter: Payer: Self-pay | Admitting: Allergy & Immunology

## 2022-09-11 ENCOUNTER — Other Ambulatory Visit: Payer: Self-pay

## 2022-09-11 VITALS — BP 122/70 | HR 72 | Temp 98.0°F | Resp 20 | Ht 71.0 in | Wt 303.0 lb

## 2022-09-11 DIAGNOSIS — J310 Chronic rhinitis: Secondary | ICD-10-CM | POA: Diagnosis not present

## 2022-09-11 DIAGNOSIS — J4540 Moderate persistent asthma, uncomplicated: Secondary | ICD-10-CM

## 2022-09-11 MED ORDER — FLUTICASONE PROPIONATE 50 MCG/ACT NA SUSP: 2.0000 | Freq: Every day | NASAL | 1 refills | Status: DC

## 2022-09-11 MED ORDER — FEXOFENADINE HCL 180 MG PO TABS: 180.0000 mg | ORAL_TABLET | Freq: Every day | ORAL | 1 refills | Status: DC

## 2022-09-11 MED ORDER — TRELEGY ELLIPTA 100-62.5-25 MCG/ACT IN AEPB: 1.0000 | INHALATION_SPRAY | Freq: Every day | RESPIRATORY_TRACT | 1 refills | Status: AC

## 2022-09-11 MED ORDER — ALBUTEROL SULFATE HFA 108 (90 BASE) MCG/ACT IN AERS: 2.0000 | INHALATION_SPRAY | Freq: Four times a day (QID) | RESPIRATORY_TRACT | 3 refills | Status: DC | PRN

## 2022-09-11 NOTE — Patient Instructions (Addendum)
1. Moderate persistent asthma, uncomplicated - Lung testing was better than your first attempt next time.  - It seems that the Trelegy is well covered by the [facility].  - Daily controller medication(s): Trelegy 100/62.5/25 one puff once daily - Prior to physical activity: albuterol 2 puffs 10-15 minutes before physical activity. - Rescue medications: albuterol 4 puffs every 4-6 hours as needed and albuterol nebulizer one vial every 4-6 hours as needed - Asthma control goals:  * Full participation in all desired activities (may need albuterol before activity) * Albuterol use two time or less a week on average (not counting use with activity) * Cough interfering with sleep two time or less a month * Oral steroids no more than once a year * No hospitalizations  2. Chronic non-allergic rhinitis - Testing was negative via the blood. t - Continue with the Allegra daily as you are doing.  3. Follow up in 6 months or earlier if needed.    Please inform us of any Emergency Department visits, hospitalizations, or changes in symptoms. Call us before going to the ED for breathing or allergy symptoms since we might be able to fit you in for a sick visit. Feel free to contact us anytime with any questions, problems, or concerns.  It was a pleasure to see you and your family again today!  Websites that have reliable patient information: 1. American Academy of Asthma, Allergy, and Immunology: www.aaaai.org 2. Food Allergy Research and Education (FARE): foodallergy.org 3. Mothers of Asthmatics: http://www.asthmacommunitynetwork.org 4. American College of Allergy, Asthma, and Immunology: www.acaai.org   COVID-19 Vaccine Information can be found at: ShippingScam.co.uk For questions related to vaccine distribution or appointments, please email [email or web address] or call [phone].   We realize that you might be concerned about having an  allergic reaction to the COVID19 vaccines. To help with that concern, WE ARE OFFERING THE COVID19 VACCINES IN OUR OFFICE! Ask the front desk for dates!     "Like" [place] on Facebook and Instagram for our latest updates!      A healthy democracy works best when [employer] participate! Make sure you are registered to vote! If you have moved or changed any of your contact information, you will need to get this updated before voting!  In some cases, you MAY be able to register to vote online: [email or web address]

## 2022-09-11 NOTE — Addendum Note (Signed)
Addended by: Valentina Shaggy on: 09/11/2022 03:28 PM   Modules accepted: Orders

## 2022-09-11 NOTE — Progress Notes (Signed)
FOLLOW UP  Date of Service/Encounter:  09/11/22   Assessment:   Moderate persistent asthma, uncomplicated - with marked reversibility today (starting Trelegy one puff once daily)   Chronic rhinitis - getting blood work today   Complicated past medical history including hypertension, diabetes, hyperlipidemia, as well as multiple musculoskeletal injuries leading to chronic pain  Plan/Recommendations:   1. Moderate persistent asthma, uncomplicated - Lung testing was better than your first attempt next time.  - It seems that the [facility].  - Daily controller medication(s): Trelegy 100/62.5/25 one puff once daily - Prior to physical activity: albuterol 2 puffs 10-15 minutes before physical activity. - Rescue medications: albuterol 4 puffs every 4-6 hours as needed and albuterol nebulizer one vial every 4-6 hours as needed - Asthma control goals:  * Full participation in all desired activities (may need albuterol before activity) * Albuterol use two time or less a week on average (not counting use with activity) * Cough interfering with sleep two time or less a month * Oral steroids no more than once a year * No hospitalizations  2. Chronic non-allergic rhinitis - Testing was negative via the blood.  - Continue with the Allegra daily as you are doing.  3. Follow up in 6 months or earlier if needed.    Subjective:   Brianna Wheeler is a 67 y.o. female presenting today for follow up of  Chief Complaint  Patient presents with   Follow-up    Pt states she is doing good so far    Brianna Wheeler has a history of the following: Patient Active Problem List   Diagnosis Date Noted   Headache 04/05/2022   Frequent falls 09/13/2021   Hypokalemia 07/28/2021   Morbid obesity ([place]) 12/08/2020   Adjustment reaction with mixed emotional features 09/12/2020   Serum gammaglobulin increased 02/11/2019   Chronic venous insufficiency 01/28/2019   Nocturia  01/28/2019   Intractable acute post-traumatic headache 12/11/2018   GERD (gastroesophageal reflux disease) 05/01/2016   Vertebral compression fracture ([place]) 12/09/2015   Metatarsalgia of both feet 09/20/2015   Allergic rhinitis 08/30/2015   Transaminitis 04/29/2015   Cataract 01/01/2013   Back pain 06/10/2012   Colon cancer screening 02/11/2012   Asthma 12/07/2011   Depression 05/10/2011   Chronic sinusitis 09/20/2010   Osteoarthritis 04/07/2009   Hypothyroidism 03/03/2008   Lumbar back pain with radiculopathy affecting right lower extremity 10/03/2007   Prediabetes 05/08/2007   HLD (hyperlipidemia) 09/19/2006   HYPERTENSION, BENIGN SYSTEMIC 09/19/2006   Diaphragmatic hernia 09/19/2006    History obtained from: chart review and patient and his son.  Brianna Wheeler is a 67 y.o. female presenting for a follow up visit.  She was last seen in August 2023 as a new patient.  At that time, her lung testing was in the 30% range but improved to 50% with the albuterol treatment.  We started Trelegy 100 mcg 1 puff once daily and albuterol as needed.  We obtained lab work to look for environmental allergies because her lung function was so terrible.  We continue with Allegra.  We did a slew of lab work including environmental allergy panel which was completely negative.  We also got labs to look for difficult to control causes of asthma and this was negative including an alpha 1 antitrypsin level.  Since last visit, she has done very well.   Asthma/Respiratory Symptom History: She feels that her breathing is better with the Trelegy.  This is working  well to control her SOB.  She has been getting this through the [place] without any problems.  She has not been using her rescue inhaler much at all.  She has not been on prednisone nor she been to the emergency room for any of her symptoms.  Allergic Rhinitis Symptom History: She has  been using her Allegra.  She has not had any sinus infections.  She has not  had COVID since last time I saw her.  Her sister is in the hospital with bilateral pneumonia.  She apparently was admitted to [facility] and was transferred to [facility].  They are trying to get into see her to make sure that she is doing okay.  They do seem very stressed about the whole situation.  They are in a rather encouraged to get out and try to see her today.  Her landlord has been better about fixing things at their home.  They fixed the rotted flooring.  They also have 6 water leak as well that led to all of the mold and mildew damage.  Otherwise, there have been no changes to her past medical history, surgical history, family history, or social history.    Review of Systems  Constitutional: Negative.  Negative for fever, malaise/fatigue and weight loss.  HENT:  Positive for congestion. Negative for ear discharge and ear pain.   Eyes:  Negative for pain, discharge and redness.  Respiratory:  Negative for cough, sputum production, shortness of breath and wheezing.   Cardiovascular: Negative.  Negative for chest pain and palpitations.  Gastrointestinal:  Negative for abdominal pain, heartburn, nausea and vomiting.  Skin: Negative.  Negative for itching and rash.  Neurological:  Negative for dizziness and headaches.  Endo/Heme/Allergies:  Negative for environmental allergies. Does not bruise/bleed easily.       Objective:   Blood pressure 122/70, pulse 72, temperature 98 F (36.7 C), resp. rate 20, height 5' 11"$  (1.803 m), weight (!) 303 lb (137.4 kg), SpO2 97 %. Body mass index is 42.26 kg/m.    Physical Exam Vitals reviewed.  Constitutional:      Appearance: She is well-developed. She is obese. She is not ill-appearing or toxic-appearing.     Comments: Using a walker.  Very talkative.  Smiling.  HENT:     Head: Normocephalic and atraumatic.     Right Ear: Tympanic membrane, ear canal and external ear normal. No drainage, swelling or tenderness.  Tympanic membrane is not injected, scarred, erythematous, retracted or bulging.     Left Ear: Tympanic membrane, ear canal and external ear normal. No drainage, swelling or tenderness. Tympanic membrane is not injected, scarred, erythematous, retracted or bulging.     Nose: No nasal deformity, septal deviation, mucosal edema or rhinorrhea.     Right Turbinates: Enlarged, swollen and pale.     Left Turbinates: Enlarged, swollen and pale.     Right Sinus: No maxillary sinus tenderness or frontal sinus tenderness.     Left Sinus: No maxillary sinus tenderness or frontal sinus tenderness.     Comments: No nasal polyps.  Dried rhinorrhea.  No epistaxis.    Mouth/Throat:     Mouth: Mucous membranes are not pale and not dry.     Pharynx: Uvula midline.  Eyes:     General:        Right eye: No discharge.        Left eye: No discharge.     Conjunctiva/sclera: Conjunctivae normal.     Right  eye: Right conjunctiva is not injected. No chemosis.    Left eye: Left conjunctiva is not injected. No chemosis.    Pupils: Pupils are equal, round, and reactive to light.  Cardiovascular:     Rate and Rhythm: Normal rate and regular rhythm.     Heart sounds: Normal heart sounds.  Pulmonary:     Effort: Pulmonary effort is normal. No tachypnea, accessory muscle usage or respiratory distress.     Breath sounds: Normal breath sounds. No wheezing, rhonchi or rales.     Comments: Coarse upper airway sounds bilaterally.  Decreased air movement at the bases. Chest:     Chest wall: No tenderness.  Lymphadenopathy:     Head:     Right side of head: No submandibular, tonsillar or occipital adenopathy.     Left side of head: No submandibular, tonsillar or occipital adenopathy.     Cervical: No cervical adenopathy.  Skin:    Coloration: Skin is not pale.     Findings: No abrasion, erythema, petechiae or rash. Rash is not papular, urticarial or vesicular.  Neurological:     Mental Status: She is alert.   Psychiatric:        Behavior: Behavior is cooperative.      Diagnostic studies:    Spirometry: results abnormal (FEV1: 1.34/44%, FVC: 1.89/48%, FEV1/FVC: 71%).    Spirometry consistent with possible restrictive disease.  This is a little bit better than the original one obtained at the last visit, although it is not normal.  However, she does report a lot of back pain today.   Allergy Studies: none        Salvatore Marvel, MD  [facility]

## 2022-09-14 DIAGNOSIS — Z1211 Encounter for screening for malignant neoplasm of colon: Secondary | ICD-10-CM | POA: Diagnosis not present

## 2022-09-14 DIAGNOSIS — R296 Repeated falls: Secondary | ICD-10-CM | POA: Diagnosis not present

## 2022-09-25 ENCOUNTER — Telehealth: Payer: Self-pay

## 2022-09-25 ENCOUNTER — Telehealth: Payer: Self-pay | Admitting: Student

## 2022-09-25 NOTE — Telephone Encounter (Signed)
Patient calls nurse line regarding fall that occurred earlier this morning. Patient reports that sister passed away recently and has been very stressed and ended up falling. She denies any dizziness or lightheadedness precipitating the fall.   She denies head injury or LOC. She has been taking tramadol, voltaren gel and heat packs to help manage pain. She is asking for further recommendations from provider.   Due to planning sister's funeral she would like to avoid having to come into the office if possible.   Will forward to PCP for further advisement.   Talbot Grumbling, RN

## 2022-09-25 NOTE — Telephone Encounter (Signed)
Patient calls the after hours line to ask if it is appropriate to take tramadol and tylenol concurrently for headache. She tells me she is under a great amount of stress as she is planning her sister's funeral. She attributes both the headache and the fall to stress and feeling overwhelmed. Denies any head trauma or LOC with the fall. Likewise no weakness or vision changes.  Discussed that headache and falls is a somewhat concerning constellation of symptoms in someone her age but she denies any motor or sensory deficits. Reviewed ED precautions for headache refractory to tylenol or for new sensory/motor deficits, vision changes, or recurrent fall. She voices understanding. Plans to take both tylenol and tramadol and try to get some rest tonight.  Has PCP visit on 3/10.  Marnee Guarneri, MD

## 2022-09-28 NOTE — Telephone Encounter (Signed)
Tried reaching out to patient, patient did not answer. Will try again later! 

## 2022-10-04 ENCOUNTER — Telehealth: Payer: Self-pay

## 2022-10-04 NOTE — Telephone Encounter (Signed)
Patient calls nurse line requesting to speak with provider about possible reasons for shaky hands.   She reports that she has been under a significant amount of stress for the last two weeks due to the death of her sister.    Symptoms have been going on intermittently for about the last week. She is asking if this could be stress related. She denies any other neurological symptoms.   Advised patient that she should schedule appointment for further evaluation. Patient scheduled on 10/12/22 with Dr. Gwendlyn Deutscher.   ED precautions discussed. Patient verbalizes understanding.   Talbot Grumbling, RN

## 2022-10-12 ENCOUNTER — Ambulatory Visit: Payer: Medicare Other | Admitting: Family Medicine

## 2022-10-29 ENCOUNTER — Other Ambulatory Visit: Payer: Self-pay

## 2022-10-29 DIAGNOSIS — M5441 Lumbago with sciatica, right side: Secondary | ICD-10-CM

## 2022-10-30 ENCOUNTER — Other Ambulatory Visit: Payer: Self-pay

## 2022-10-30 MED ORDER — TRAMADOL HCL 50 MG PO TABS: 50.0000 mg | ORAL_TABLET | Freq: Three times a day (TID) | ORAL | 2 refills | Status: DC | PRN

## 2022-10-31 ENCOUNTER — Ambulatory Visit: Payer: Medicare Other | Admitting: Student

## 2022-11-02 ENCOUNTER — Ambulatory Visit (INDEPENDENT_AMBULATORY_CARE_PROVIDER_SITE_OTHER): Payer: Medicare Other | Admitting: Family Medicine

## 2022-11-02 ENCOUNTER — Encounter: Payer: Self-pay | Admitting: Family Medicine

## 2022-11-02 VITALS — BP 126/67 | HR 69 | Ht 71.0 in | Wt 301.6 lb

## 2022-11-02 DIAGNOSIS — R7303 Prediabetes: Secondary | ICD-10-CM

## 2022-11-02 DIAGNOSIS — E039 Hypothyroidism, unspecified: Secondary | ICD-10-CM

## 2022-11-02 DIAGNOSIS — I10 Essential (primary) hypertension: Secondary | ICD-10-CM | POA: Diagnosis not present

## 2022-11-02 DIAGNOSIS — E785 Hyperlipidemia, unspecified: Secondary | ICD-10-CM

## 2022-11-02 DIAGNOSIS — R251 Tremor, unspecified: Secondary | ICD-10-CM | POA: Diagnosis not present

## 2022-11-02 DIAGNOSIS — M5416 Radiculopathy, lumbar region: Secondary | ICD-10-CM | POA: Diagnosis not present

## 2022-11-02 DIAGNOSIS — R296 Repeated falls: Secondary | ICD-10-CM

## 2022-11-02 NOTE — Assessment & Plan Note (Signed)
TSH checked 

## 2022-11-02 NOTE — Assessment & Plan Note (Signed)
Worsened by recent fall but improving. No neurologic deficit. Continue current dose of Tramadol and Gabapentin. May add on Tylenol prn. PT recommended but she declined. F/U soon if there is still no improvement. She agreed with the plan.

## 2022-11-02 NOTE — Assessment & Plan Note (Signed)
She is currently asymptomatic. Differentials include excess thyroid intake, electrolyte imbalance, aging or deconditioning, parkinsonism (less likely) and medication induced. Lab tested today.  See individual problem lists. F/U soon if worsening.

## 2022-11-02 NOTE — Assessment & Plan Note (Addendum)
PT referral discussed. She declined. Home fall prevention instruction provided.  Patient counseled that her bruises will resolve with time. Monitor closely.

## 2022-11-02 NOTE — Assessment & Plan Note (Signed)
BP looks good. Cmet and FLP checked. I will contact her soon with her results.

## 2022-11-02 NOTE — Assessment & Plan Note (Signed)
A1C been in the diabetic range in the past. Repeat discussed. She stated that she had a recent home visit A1C check which was normal. However, she does not have the result. I offered to delay A1C test till she brings in her result vs retesting today. She prefers to get her A1C checked for this visit. A1C ordered.

## 2022-11-02 NOTE — Assessment & Plan Note (Signed)
Compliant with Crestor. FLP checked. Will contact her soon with her results.

## 2022-11-02 NOTE — Progress Notes (Signed)
    SUBJECTIVE:   CHIEF COMPLAINT / HPI:   Back pain: The patient has a hx of chronic low back pain due to vertebrae fractures. However, due to a fall about seven days ago, her back pain was triggered. She uses Tramadol prn as instructed by her previous PCP with minimal improvement. She also endorsed upper back pain around her right shoulder with some bruises from her fall. Pain now is about 5/10 in severity but worsens with movement. She uses a 4-point walker and wheelchair at home for ambulation. She endorsed bruising of her right knee without knee pain.  Hand shake: She endorsed hands shakes, which started about two weeks ago. This is intermittent and usually occurs when her thyroid function is off. She takes Synthroid 88 mcg qd for her hypothyroidism. No other concerns.   Fall: No new fall since 7 days ago.  Follow up for chronic problems: All medications reviewed. She is compliant with her chronic medications.  See problem list below.  PERTINENT  PMH / PSH: PMHx reviewed  OBJECTIVE:   BP 126/67   Pulse 69   Ht 5\' 11"  (1.803 m)   Wt (!) 301 lb 9.6 oz (136.8 kg)   SpO2 100%   BMI 42.06 kg/m   Physical Exam Vitals and nursing note reviewed.  Cardiovascular:     Rate and Rhythm: Normal rate and regular rhythm.     Heart sounds: Normal heart sounds. No murmur heard. Pulmonary:     Effort: Pulmonary effort is normal.     Breath sounds: Normal breath sounds. No wheezing.  Musculoskeletal:     Cervical back: No tenderness.     Thoracic back: Normal.     Lumbar back: Normal.     Right knee: Normal range of motion.     Left knee: Normal. Normal range of motion.     Comments: Bruises on her right upper back, around her right shoulder blade. No erythema or tenderness. Bruises on her right knee with no swelling, erythema or tenderness   Right Upper Back    Right Knee    ASSESSMENT/PLAN:   Lumbar back pain with radiculopathy affecting right lower extremity Worsened by  recent fall but improving. No neurologic deficit. Continue current dose of Tramadol and Gabapentin. May add on Tylenol prn. PT recommended but she declined. F/U soon if there is still no improvement. She agreed with the plan.   Tremor of both hands She is currently asymptomatic. Differentials include excess thyroid intake, electrolyte imbalance, aging or deconditioning, parkinsonism (less likely) and medication induced. Lab tested today.  See individual problem lists. F/U soon if worsening.    Frequent falls PT referral discussed. She declined. Home fall prevention instruction provided.  Patient counseled that her bruises will resolve with time. Monitor closely.  HYPERTENSION, BENIGN SYSTEMIC BP looks good. Cmet and FLP checked. I will contact her soon with her results.  Hypothyroidism TSH checked.  HLD (hyperlipidemia) Compliant with Crestor. FLP checked. Will contact her soon with her results.   Prediabetes A1C been in the diabetic range in the past. Repeat discussed. She stated that she had a recent home visit A1C check which was normal. However, she does not have the result. I offered to delay A1C test till she brings in her result vs retesting today. She prefers to get her A1C checked for this visit. A1C ordered.     Janit Pagan, MD [facility]

## 2022-11-02 NOTE — Patient Instructions (Signed)
Fall Prevention in the Home, Adult Falls can cause injuries and can happen to people of all ages. There are many things you can do to make your home safer and to help prevent falls. What actions can I take to prevent falls? General information Use good lighting in all rooms. Make sure to: Replace any light bulbs that burn out. Turn on the lights in dark areas and use night-lights. Keep items that you use often in easy-to-reach places. Lower the shelves around your home if needed. Move furniture so that there are clear paths around it. Do not use throw rugs or other things on the floor that can make you trip. If any of your floors are uneven, fix them. Add color or contrast paint or tape to clearly mark and help you see: Grab bars or handrails. First and last steps of staircases. Where the edge of each step is. If you use a ladder or stepladder: Make sure that it is fully opened. Do not climb a closed ladder. Make sure the sides of the ladder are locked in place. Have someone hold the ladder while you use it. Know where your pets are as you move through your home. What can I do in the bathroom?     Keep the floor dry. Clean up any water on the floor right away. Remove soap buildup in the bathtub or shower. Buildup makes bathtubs and showers slippery. Use non-skid mats or decals on the floor of the bathtub or shower. Attach bath mats securely with double-sided, non-slip rug tape. If you need to sit down in the shower, use a non-slip stool. Install grab bars by the toilet and in the bathtub and shower. Do not use towel bars as grab bars. What can I do in the bedroom? Make sure that you have a light by your bed that is easy to reach. Do not use any sheets or blankets on your bed that hang to the floor. Have a firm chair or bench with side arms that you can use for support when you get dressed. What can I do in the kitchen? Clean up any spills right away. If you need to reach something  above you, use a step stool with a grab bar. Keep electrical cords out of the way. Do not use floor polish or wax that makes floors slippery. What can I do with my stairs? Do not leave anything on the stairs. Make sure that you have a light switch at the top and the bottom of the stairs. Make sure that there are handrails on both sides of the stairs. Fix handrails that are broken or loose. Install non-slip stair treads on all your stairs if they do not have carpet. Avoid having throw rugs at the top or bottom of the stairs. Choose a carpet that does not hide the edge of the steps on the stairs. Make sure that the carpet is firmly attached to the stairs. Fix carpet that is loose or worn. What can I do on the outside of my home? Use bright outdoor lighting. Fix the edges of walkways and driveways and fix any cracks. Clear paths of anything that can make you trip, such as tools or rocks. Add color or contrast paint or tape to clearly mark and help you see anything that might make you trip as you walk through a door, such as a raised step or threshold. Trim any bushes or trees on paths to your home. Check to see if handrails are loose   or broken and that both sides of all steps have handrails. Install guardrails along the edges of any raised decks and porches. Have leaves, snow, or ice cleared regularly. Use sand, salt, or ice melter on paths if you live where there is ice and snow during the winter. Clean up any spills in your garage right away. This includes grease or oil spills. What other actions can I take? Review your medicines with your doctor. Some medicines can cause dizziness or changes in blood pressure, which increase your risk of falling. Wear shoes that: Have a low heel. Do not wear high heels. Have rubber bottoms and are closed at the toe. Feel good on your feet and fit well. Use tools that help you move around if needed. These include: Canes. Walkers. Scooters. Crutches. Ask  your doctor what else you can do to help prevent falls. This may include seeing a physical therapist to learn to do exercises to move better and get stronger. Where to find more information Centers for Disease Control and Prevention, [employer]: cdc.gov National Institute on Aging: nia.nih.gov National Institute on Aging: nia.nih.gov Contact a doctor if: You are afraid of falling at home. You feel weak, drowsy, or dizzy at home. You fall at home. Get help right away if you: Lose consciousness or have trouble moving after a fall. Have a fall that causes a head injury. These symptoms may be an emergency. Get help right away. Call 911. Do not wait to see if the symptoms will go away. Do not drive yourself to the hospital. This information is not intended to replace advice given to you by your health care provider. Make sure you discuss any questions you have with your health care provider. Document Revised: 03/12/2022 Document Reviewed: 03/12/2022 Elsevier Patient Education  2023 Elsevier Inc.  

## 2022-11-03 LAB — CMP14+EGFR
ALT: 26 IU/L (ref 0–32)
AST: 62 IU/L — ABNORMAL HIGH (ref 0–40)
Albumin/Globulin Ratio: 0.6 — ABNORMAL LOW (ref 1.2–2.2)
Albumin: 2.9 g/dL — ABNORMAL LOW (ref 3.9–4.9)
Alkaline Phosphatase: 72 IU/L (ref 44–121)
BUN/Creatinine Ratio: 9 — ABNORMAL LOW (ref 12–28)
BUN: 7 mg/dL — ABNORMAL LOW (ref 8–27)
Bilirubin Total: 2.1 mg/dL — ABNORMAL HIGH (ref 0.0–1.2)
CO2: 25 mmol/L (ref 20–29)
Calcium: 8.5 mg/dL — ABNORMAL LOW (ref 8.7–10.3)
Chloride: 103 mmol/L (ref 96–106)
Creatinine, Ser: 0.82 mg/dL (ref 0.57–1.00)
Globulin, Total: 4.7 g/dL — ABNORMAL HIGH (ref 1.5–4.5)
Glucose: 90 mg/dL (ref 70–99)
Potassium: 4 mmol/L (ref 3.5–5.2)
Sodium: 137 mmol/L (ref 134–144)
Total Protein: 7.6 g/dL (ref 6.0–8.5)
eGFR: 78 mL/min/{1.73_m2} (ref >59)

## 2022-11-03 LAB — HEMOGLOBIN A1C
Est. average glucose Bld gHb Est-mCnc: 94 mg/dL
Hgb A1c MFr Bld: 4.9 % (ref 4.8–5.6)

## 2022-11-03 LAB — LIPID PANEL
Chol/HDL Ratio: 5.2 ratio — ABNORMAL HIGH (ref 0.0–4.4)
Cholesterol, Total: 213 mg/dL — ABNORMAL HIGH (ref 100–199)
HDL: 41 mg/dL (ref >39)
LDL Chol Calc (NIH): 159 mg/dL — ABNORMAL HIGH (ref 0–99)
Triglycerides: 73 mg/dL (ref 0–149)
VLDL Cholesterol Cal: 13 mg/dL (ref 5–40)

## 2022-11-03 LAB — T4F: T4,Free (Direct): 1.05 ng/dL (ref 0.82–1.77)

## 2022-11-03 LAB — TSH RFX ON ABNORMAL TO FREE T4: TSH: 16.3 u[IU]/mL — ABNORMAL HIGH (ref 0.450–4.500)

## 2022-11-05 ENCOUNTER — Telehealth: Payer: Self-pay | Admitting: Family Medicine

## 2022-11-05 DIAGNOSIS — D472 Monoclonal gammopathy: Secondary | ICD-10-CM

## 2022-11-05 DIAGNOSIS — R778 Other specified abnormalities of plasma proteins: Secondary | ICD-10-CM

## 2022-11-05 NOTE — Telephone Encounter (Signed)
Patient returns call to nurse line. Patient is requesting returned call tomorrow afternoon if possible.   Provider does not have availability until 4/30. Please let me know if you would prefer [place] schedule a telephone visit for this date.   Best contact: [phone].  Veronda Prude, RN

## 2022-11-05 NOTE — Telephone Encounter (Signed)
Both numbers listed called. HIPAA compliant call back message left.  Need to discuss abnormal test result with her. When she calls, please connect her with me or schedule a telephone only appointment to discuss lab result.  Plan: Proteins abnormal - hx of similar in the past. MGUS suspected and was referred to hematologist by Dr. Leveda Anna. It is unclear if she attended this referral. AST mildly elevated. Hx of of transaminates. Need monitoring. FLP elevated. She did not coe in with her Crestor bottle at the time of her visit, Despite this, she stated she was taking this medication. Will need to clarify. Abnormally elevated TSH. Claimed med compliance at the time of her visit. Need to readdress.

## 2022-11-06 ENCOUNTER — Other Ambulatory Visit: Payer: Self-pay | Admitting: Family Medicine

## 2022-11-06 DIAGNOSIS — E785 Hyperlipidemia, unspecified: Secondary | ICD-10-CM

## 2022-11-06 MED ORDER — LEVOTHYROXINE SODIUM 100 MCG PO TABS: 100.0000 ug | ORAL_TABLET | ORAL | 0 refills | Status: DC

## 2022-11-06 NOTE — Telephone Encounter (Signed)
She returned my call. Test result discussed.  Plan: Proteins abnormal - hx of similar in the past. MGUS suspected due to elevated M-spike protein in 2020 and she was referred to hematologist by Dr. Leveda Anna. It is unclear if she attended this referral. She can't remember discussing this in the past. Referral placed and she agreed with the plan.  AST mildly elevated. Hx of of transaminates. Need monitoring. Discussed repeat in 3 months with hepatitis panel. FLP elevated. She did not come in with her Crestor bottle at the time of her visit, Unclear if taking meds. Her refill was due mid March. Refills sent in today. Repeat test in 6 months. She agreed with the plan. Abnormally elevated TSH. Claimed med compliance at the time of her visit.  Discussed going up on her Synthroid. She agreed with the plan. Synthroid increased from 88 mcg qd to 100 mcg qd. Repeat test in 4-8 weeks.

## 2022-11-06 NOTE — Addendum Note (Signed)
Addended by: Janit Pagan T on: 11/06/2022 04:52 PM   Modules accepted: Orders

## 2022-11-06 NOTE — Telephone Encounter (Signed)
I called patient this afternoon with no response. HIPAA compliant message left. Please help her schedule telephone appointment with me or any available provider. I need her to please either come in to discuss her report or schedule a telephone appointment.

## 2022-11-08 ENCOUNTER — Telehealth: Payer: Self-pay

## 2022-11-08 NOTE — Telephone Encounter (Signed)
Patient returns call to nurse line. She reports taking rosuvastatin 20 mg this morning. Reports feeling very fatigued for about an hour after taking. She states that she also had to wake up extremely early this morning due to plumber coming to her home. Woke up about 3 hours earlier than normal.   She denies SHOB, chest pain, difficulty swallowing, or hives. She reports that she now feels fine.   She wasn't sure if this was related to starting the new medication or change in her sleep. Patient is going to continue taking medication and call back if she has continued drowsiness or new symptoms.   Red flags discussed.   Veronda Prude, RN

## 2022-11-08 NOTE — Telephone Encounter (Signed)
Patient LVM on nurse line requesting to speak with someone about her medications.   I attempted to call patient back, however no answer.   VM left asking her to return my call.

## 2022-11-27 ENCOUNTER — Telehealth: Payer: Self-pay

## 2022-11-27 NOTE — Telephone Encounter (Signed)
Patient calls nurse line requesting an updated prescription for compression stockings.   Prescription needs to include the pressure and size.    Patient reports she will pick up physical prescription and take to a medical supply store.   Will forward to PCP.

## 2022-11-28 ENCOUNTER — Other Ambulatory Visit: Payer: Self-pay | Admitting: Family Medicine

## 2022-11-28 MED ORDER — MEDICAL COMPRESSION STOCKINGS MISC: 0 refills | Status: DC

## 2022-11-28 NOTE — Telephone Encounter (Signed)
Attempted to reach patient. No answer. LVM of message left for doctor. Aquilla Solian, CMA

## 2022-11-28 NOTE — Telephone Encounter (Signed)
Script printed and placed in the front office for pick up.

## 2022-11-30 NOTE — Telephone Encounter (Signed)
Patient's son presents to clinic. Provided with prescription as indicated by Dr. Lum Babe.   Veronda Prude, RN

## 2022-12-24 ENCOUNTER — Telehealth: Payer: Self-pay

## 2022-12-24 NOTE — Telephone Encounter (Signed)
Patient calls nurse line regarding fall that occurred early on Saturday morning. She reports that around 4 am she went to the bathroom, lost her balance and fell. She does report that she hit the front/top part of her head. Denies LOC. No current headache, vision changes, dizziness nausea or vomiting.   She has been using ice to the area. Reports that knot on head has improved over the last few days.   She wanted to call and make provider aware.   Strict ED precautions discussed. Patient will call back with any changes or concerns.   Veronda Prude, RN

## 2022-12-28 NOTE — Telephone Encounter (Signed)
Patient returns call to nurse line requesting to schedule appointment for Monday.   She reports that she is having intermittent headaches. States that she has been taking Tylenol and this helps with headaches.   Denies visual changes, nausea, vomiting, lightheadedness, dizziness, or changes in balance.   Scheduled patient for follow up on Monday afternoon.   Discussed ED precautions. Patient verbalizes understanding.   Veronda Prude, RN

## 2022-12-31 ENCOUNTER — Ambulatory Visit (INDEPENDENT_AMBULATORY_CARE_PROVIDER_SITE_OTHER): Payer: Medicare Other | Admitting: Family Medicine

## 2022-12-31 VITALS — BP 135/65 | HR 90 | Ht 71.0 in | Wt 295.1 lb

## 2022-12-31 DIAGNOSIS — R519 Headache, unspecified: Secondary | ICD-10-CM

## 2022-12-31 NOTE — Assessment & Plan Note (Signed)
Patient presented initially for headaches after traumatic fall.  Patient also has different stressors that are likely contributing to tension headaches.  Patient's symptoms are improved with the tramadol and Tylenol at night.  No need for further workup at this time, discussed return precautions.

## 2022-12-31 NOTE — Patient Instructions (Signed)
Everything looks good today.  Make sure to come back if you continue to have headaches that are not getting better with Tylenol or if your headaches become more persistent.  Also make sure to call if you start having any nausea or vomiting episodes.

## 2022-12-31 NOTE — Progress Notes (Signed)
    SUBJECTIVE:   CHIEF COMPLAINT / HPI:   Headaches - Had a fall and has been having headaches - Headaches have been improving - Also thinks stress is contributing related to snakes this time of year as she had a copperhead in her house last year - Is able to get rid of the headaches with her tramadol and tylenol  PERTINENT  PMH / PSH: Reviewed  OBJECTIVE:   BP 135/65   Pulse 90   Ht 5\' 11"  (1.803 m)   Wt 295 lb 2 oz (133.9 kg)   SpO2 98%   BMI 41.16 kg/m   Gen: well-appearing, NAD CV: RRR, no m/r/g appreciated, no peripheral edema Pulm: CTAB, no wheezes/crackles Neuro: No neurologic deficits other than baseline eye issues  ASSESSMENT/PLAN:   Headache Patient presented initially for headaches after traumatic fall.  Patient also has different stressors that are likely contributing to tension headaches.  Patient's symptoms are improved with the tramadol and Tylenol at night.  No need for further workup at this time, discussed return precautions.     Evelena Leyden, DO [facility]

## 2023-01-30 ENCOUNTER — Other Ambulatory Visit: Payer: Self-pay | Admitting: Family Medicine

## 2023-01-30 DIAGNOSIS — E785 Hyperlipidemia, unspecified: Secondary | ICD-10-CM

## 2023-02-07 ENCOUNTER — Other Ambulatory Visit: Payer: Self-pay | Admitting: Family Medicine

## 2023-02-11 ENCOUNTER — Other Ambulatory Visit: Payer: Self-pay

## 2023-02-11 DIAGNOSIS — I10 Essential (primary) hypertension: Secondary | ICD-10-CM

## 2023-02-11 MED ORDER — HYDROCHLOROTHIAZIDE 12.5 MG PO TABS: 12.5000 mg | ORAL_TABLET | Freq: Every day | ORAL | 3 refills | Status: DC

## 2023-02-19 ENCOUNTER — Ambulatory Visit: Payer: Medicare Other | Admitting: Family Medicine

## 2023-02-25 ENCOUNTER — Ambulatory Visit (INDEPENDENT_AMBULATORY_CARE_PROVIDER_SITE_OTHER): Payer: Medicare Other | Admitting: Family Medicine

## 2023-02-25 ENCOUNTER — Encounter: Payer: Self-pay | Admitting: Family Medicine

## 2023-02-25 VITALS — BP 121/74 | HR 76 | Wt 297.8 lb

## 2023-02-25 DIAGNOSIS — R7401 Elevation of levels of liver transaminase levels: Secondary | ICD-10-CM

## 2023-02-25 DIAGNOSIS — D892 Hypergammaglobulinemia, unspecified: Secondary | ICD-10-CM | POA: Diagnosis not present

## 2023-02-25 DIAGNOSIS — Z1231 Encounter for screening mammogram for malignant neoplasm of breast: Secondary | ICD-10-CM | POA: Diagnosis not present

## 2023-02-25 DIAGNOSIS — E039 Hypothyroidism, unspecified: Secondary | ICD-10-CM

## 2023-02-25 DIAGNOSIS — I10 Essential (primary) hypertension: Secondary | ICD-10-CM

## 2023-02-25 NOTE — Assessment & Plan Note (Signed)
Encouraged to make heme/onc appointment. Obtain SPEP today.

## 2023-02-25 NOTE — Assessment & Plan Note (Signed)
Recheck labs today and adjust regimen as indicated.

## 2023-02-25 NOTE — Patient Instructions (Addendum)
It was great to see you!  Our plans for today:  - Call to schedule hematology appt. [phone]  - No changes to medications for now. - We ordered a screening mammogram for you today. You can call [facility] at [phone] to schedule this.  -Come back in 3 months.  We are checking some labs today, we will release these results to your MyChart.  Take care and seek immediate care sooner if you develop any concerns.   Dr. Linwood Dibbles

## 2023-02-25 NOTE — Progress Notes (Signed)
   SUBJECTIVE:   CHIEF COMPLAINT / HPI:   Hypertension: - Medications: hydrochlorothiazide  - Compliance: good  Hypothyroidism - Medications: Synthroid (increased at last visit) - Current symptoms:  fatigue  Elevated protein - seen previously with M spike noted in 2020. Referred to hematology but not seen. Needs to call to reschedule  Elevated transaminases - primarily AST. Alk phos, alpha 1 antitrypsin normal. Needs hep panel, iron studies.  OBJECTIVE:   BP 121/74 (BP Location: Left Arm, Patient Position: Sitting, Cuff Size: Large)   Pulse 76   Wt 297 lb 12.8 oz (135.1 kg)   SpO2 97%   BMI 41.53 kg/m   Gen: elderly, in NAD, ambulates with walker Card: RRR Lungs: CTAB Ext: WWP  ASSESSMENT/PLAN:   HYPERTENSION, BENIGN SYSTEMIC Doing well on current regimen, no changes made today.  Hypothyroidism Recheck labs today and adjust regimen as indicated.  Transaminitis Repeat CMP. Obtain hep panel and iron panel today. May need RUQ [place] pending results. F/u 3 months, at that time assess alcohol use.  Serum gammaglobulin increased Encouraged to make heme/onc appointment. Obtain SPEP today.  Health maintenance - ordered mammogram   F/u 3 months.  Caro Laroche, DO

## 2023-02-25 NOTE — Assessment & Plan Note (Signed)
Doing well on current regimen, no changes made today. 

## 2023-02-25 NOTE — Assessment & Plan Note (Signed)
Repeat CMP. Obtain hep panel and iron panel today. May need RUQ [place] pending results. F/u 3 months, at that time assess alcohol use.

## 2023-02-27 MED ORDER — LEVOTHYROXINE SODIUM 125 MCG PO TABS: 125.0000 ug | ORAL_TABLET | Freq: Every day | ORAL | 0 refills | Status: DC

## 2023-02-27 NOTE — Addendum Note (Signed)
Addended by: Caro Laroche on: 02/27/2023 09:36 AM   Modules accepted: Orders

## 2023-03-19 ENCOUNTER — Telehealth: Payer: Self-pay

## 2023-03-19 NOTE — Telephone Encounter (Signed)
Patient calls nurse line reporting possible signs of a heat stroke.   She reports she was sitting outside for a "while" and did not realize how hot it was supposed to be today.   She reports she has been drinking water and Gatorade and reports feeling "a lot" better than she did.   She denies any dizziness, nausea, confusion or vision changes. She sounded alert and oriented on the phone.  Patient advised to use a cool rag on her neck and to monitor her symptoms. Advised to drink fluids.   Patient advised to monitor the weather before sitting outside for prolonged periods of time.   Patient advised emergent evaluation if any above symptoms occur.

## 2023-03-20 NOTE — Telephone Encounter (Signed)
Agree 

## 2023-03-27 ENCOUNTER — Ambulatory Visit (INDEPENDENT_AMBULATORY_CARE_PROVIDER_SITE_OTHER): Payer: Medicare Other

## 2023-03-27 DIAGNOSIS — Z23 Encounter for immunization: Secondary | ICD-10-CM

## 2023-04-02 ENCOUNTER — Telehealth: Payer: Self-pay

## 2023-04-02 NOTE — Telephone Encounter (Signed)
Patient LVM on nurse line requesting returned call due to back issues. Attempted to call patient back at both house and cell phone numbers. She did not answer, LVM asking that she return call to office.   Veronda Prude, RN

## 2023-04-02 NOTE — Telephone Encounter (Signed)
Patient returns call to nurse line.   She reports she had a "fall" yesterday at her friends house. She reports she fell backwards when her walker went out from under her.   She denies loss of consciousness. No significant new swelling. She reports she feels she aggravated her back.   She reports she is not feeling secure with her walker anymore.   Patient advised an apt would be best for evaluation post fall.   Patient scheduled for tomorrow morning for evaluation.

## 2023-04-03 ENCOUNTER — Other Ambulatory Visit: Payer: Self-pay

## 2023-04-03 ENCOUNTER — Ambulatory Visit (INDEPENDENT_AMBULATORY_CARE_PROVIDER_SITE_OTHER): Payer: Medicare Other | Admitting: Family Medicine

## 2023-04-03 ENCOUNTER — Encounter: Payer: Self-pay | Admitting: Family Medicine

## 2023-04-03 VITALS — BP 129/51 | HR 74 | Ht 71.0 in | Wt 298.2 lb

## 2023-04-03 DIAGNOSIS — R2681 Unsteadiness on feet: Secondary | ICD-10-CM | POA: Diagnosis not present

## 2023-04-03 DIAGNOSIS — R296 Repeated falls: Secondary | ICD-10-CM | POA: Diagnosis not present

## 2023-04-03 NOTE — Assessment & Plan Note (Signed)
Recent near fall without prodromal symptoms or head trauma. No significant medication changes or substance use contributing to balance issues. Has some vision issues but is following up with ophthalmology. It is most likely that her recent near-fall was secondary to generalized weakness and pain due to previous back injuries. Pt able to perform ADLS/iADLS with sons' help, however would benefit from further assistance with mobility, especially with wheelchair use. Referral placed to [facility] PT. Pt to follow up with PCP in 2 months.

## 2023-04-03 NOTE — Patient Instructions (Signed)
Thank you for coming in today!  Things we discussed today: I have placed a referral for home health physical therapy, they should be contacting you to do an initial evaluation in the next week or so. Please let us know if you have not heard from them within a week or so.   Please make an appointment to see Dr Linwood Dibbles in 2 months.  Have a great day!

## 2023-04-03 NOTE — Progress Notes (Signed)
    SUBJECTIVE:   CHIEF COMPLAINT / HPI:   Near fall -didn't fall but legs felt weak when she went to stand up at a restaurant recently -no lightheadedness or dizziness -did not feel sick -remembers entire event -has back injury, got Toradol shot previously which helped -uses walker - needs this to stand up -has been using wheelchair more recently over the past few weeks, doesn't feel like she knows how to use it, no one has showed her how to properly use it -has ramp at home -has lots of pain when bending over due to back injuries -son lives with her, he also has medical problems but they help each other, also asks neighbors for help from time to time -needs laser eye surgery, is waiting to schedule this -some adjustment in BP meds recently, decreased dose of HCTZ -thinks she is having some memory issues -no EtOH or recreational drug use -has some frustration with her son from time to time, maybe some feeling down but denies overt mood changes   PERTINENT  PMH / PSH: HTN, GERD, hypothyroidism, lumbar back pain, OA, HLD, cataract  OBJECTIVE:   BP (!) 129/51   Pulse 74   Ht 5\' 11"  (1.803 m)   Wt 298 lb 3.2 oz (135.3 kg)   SpO2 99%   BMI 41.59 kg/m     Physical Exam Gen: older female sitting in exam room in NAD, pleasant and cooperative with exam CV: RRR, normal S1/S2, no murmurs, rubs, or gallops Pulm: CTAB, normal WOB, no wheezes, rales, or rhonchi Ext: no edema to BLE Neuro: 2/5 quad strength bilaterally, difficulty arising from chair to stand with walker, requires assistance from son  ASSESSMENT/PLAN:   Frequent falls Recent near fall without prodromal symptoms or head trauma. No significant medication changes or substance use contributing to balance issues. Has some vision issues but is following up with ophthalmology. It is most likely that her recent near-fall was secondary to generalized weakness and pain due to previous back injuries. Pt able to perform  ADLS/iADLS with sons' help, however would benefit from further assistance with mobility, especially with wheelchair use. Referral placed to [facility] PT. Pt to follow up with PCP in 2 months.    Patient Instructions  Thank you for coming in today!  Things we discussed today: I have placed a referral for home health physical therapy, they should be contacting you to do an initial evaluation in the next week or so. Please let us know if you have not heard from them within a week or so.   Please make an appointment to see Dr Linwood Dibbles in 2 months.  Have a great day!  Lorayne Bender, MD [facility]

## 2023-04-04 NOTE — Progress Notes (Signed)
Patient presents to clinic for influenza vaccination. Administered in RD, site unremarkable, tolerated injection well.   Veronda Prude, RN

## 2023-04-10 ENCOUNTER — Other Ambulatory Visit: Payer: Self-pay

## 2023-04-10 DIAGNOSIS — I10 Essential (primary) hypertension: Secondary | ICD-10-CM | POA: Diagnosis not present

## 2023-04-10 DIAGNOSIS — M5416 Radiculopathy, lumbar region: Secondary | ICD-10-CM | POA: Diagnosis not present

## 2023-04-10 DIAGNOSIS — E039 Hypothyroidism, unspecified: Secondary | ICD-10-CM | POA: Diagnosis not present

## 2023-04-10 DIAGNOSIS — Z9181 History of falling: Secondary | ICD-10-CM | POA: Diagnosis not present

## 2023-04-10 DIAGNOSIS — K219 Gastro-esophageal reflux disease without esophagitis: Secondary | ICD-10-CM

## 2023-04-10 MED ORDER — OMEPRAZOLE 20 MG PO CPDR
20.0000 mg | DELAYED_RELEASE_CAPSULE | Freq: Every day | ORAL | 3 refills | Status: DC
Start: 2023-04-10 — End: 2023-12-30

## 2023-04-12 ENCOUNTER — Telehealth: Payer: Self-pay

## 2023-04-12 NOTE — Telephone Encounter (Signed)
Monique from [place] calling for PT verbal orders as follows:  1 time(s) weekly for 6 week(s), then 2 time(s) weekly for 4 week(s),  1 time per week for 2 weeks.   Verbal orders given per [facility] protocol  Veronda Prude, RN

## 2023-04-15 DIAGNOSIS — E039 Hypothyroidism, unspecified: Secondary | ICD-10-CM | POA: Diagnosis not present

## 2023-04-15 DIAGNOSIS — M5416 Radiculopathy, lumbar region: Secondary | ICD-10-CM | POA: Diagnosis not present

## 2023-04-15 DIAGNOSIS — I10 Essential (primary) hypertension: Secondary | ICD-10-CM | POA: Diagnosis not present

## 2023-04-15 DIAGNOSIS — Z9181 History of falling: Secondary | ICD-10-CM | POA: Diagnosis not present

## 2023-04-18 DIAGNOSIS — M5416 Radiculopathy, lumbar region: Secondary | ICD-10-CM | POA: Diagnosis not present

## 2023-04-18 DIAGNOSIS — Z9181 History of falling: Secondary | ICD-10-CM | POA: Diagnosis not present

## 2023-04-18 DIAGNOSIS — E039 Hypothyroidism, unspecified: Secondary | ICD-10-CM | POA: Diagnosis not present

## 2023-04-18 DIAGNOSIS — I10 Essential (primary) hypertension: Secondary | ICD-10-CM | POA: Diagnosis not present

## 2023-04-19 ENCOUNTER — Telehealth: Payer: Self-pay | Admitting: *Deleted

## 2023-04-19 NOTE — Telephone Encounter (Signed)
Patient reports that since starting her PT her knees and thighs are "hurting really bad like sciatic pain".   Voltaren Gel and Tylenol are are not helping.  PT is on Monday.  She wants to know if she should continue and also what Dr. Linwood Dibbles suggest for the pain. Jone Baseman, CMA

## 2023-04-23 NOTE — Telephone Encounter (Signed)
Patient returns call to nurse line. She reports increased pain on right side and feeling flare up with sciatic nerve. Worsened since PT. Last PT was on Friday.   She has been taking tramadol TID and tylenol (2 per day) for pain.   She states that she has not been taking gabapentin as she was not sure if there would be adverse interaction with her taking tramadol.   Scheduled patient follow up for worsening pain with Dr. Rexene Alberts on Monday morning.   Dr. Linwood Dibbles- please advise how you would like her to proceed with pain management.   Veronda Prude, RN

## 2023-04-23 NOTE — Telephone Encounter (Signed)
Called and left message for patient per Dr. Linwood Dibbles.   Glennie Hawk, CMA

## 2023-04-24 NOTE — Telephone Encounter (Signed)
Called and LVM for patient informing her of message from PCP.  Glennie Hawk, CMA

## 2023-04-26 ENCOUNTER — Ambulatory Visit: Payer: Medicare Other

## 2023-04-29 ENCOUNTER — Ambulatory Visit (INDEPENDENT_AMBULATORY_CARE_PROVIDER_SITE_OTHER): Payer: Medicare Other | Admitting: Family Medicine

## 2023-04-29 ENCOUNTER — Encounter: Payer: Self-pay | Admitting: Family Medicine

## 2023-04-29 VITALS — BP 137/50 | HR 67 | Ht 71.0 in | Wt 296.5 lb

## 2023-04-29 DIAGNOSIS — M25561 Pain in right knee: Secondary | ICD-10-CM | POA: Diagnosis not present

## 2023-04-29 DIAGNOSIS — I10 Essential (primary) hypertension: Secondary | ICD-10-CM

## 2023-04-29 NOTE — Assessment & Plan Note (Signed)
Right knee pain after suspected injury at physical therapy 3 days ago.  Last x-ray of her right knee was in 2011 that showed mild medial compartment osteoarthritis.  History of surgery to this knee and cortisone injections.  No suspicion for gout or infectious arthritis at this time given no erythema or warmth to the knee.  I suspect that this is acute inflammation from physical therapy in the setting of chronic osteoarthritis to the knee. -Continue tramadol, gabapentin, Voltaren gel -Ultrasound today by Dr. Velna Ochs showed no effusion -Follow-up in 2 weeks if pain persists, consider cortisone injections or additional imaging at that time

## 2023-04-29 NOTE — Patient Instructions (Addendum)
It was great to see you today! Thank you for choosing [facility] for your primary care. [name] was seen for knee pain.  Today we addressed: Right knee pain - continue tramadol, gabapentin, and voltaren gel for your knee. Take a break from knee exercises at physical therapy for now. Return in 2 weeks if your pain persists. Your blood pressure was elevated but this is probably due to not taking your blood pressure medicine today.   If you haven't already, sign up for My Chart to have easy access to your labs results, and communication with your primary care physician.  Call the clinic at [phone] if your symptoms worsen or you have any concerns.  You should return to our clinic No follow-ups on file. Please arrive 15 minutes before your appointment to ensure smooth check in process.  We appreciate your efforts in making this happen.  Thank you for allowing me to participate in your care, Para March, DO 04/29/2023, 11:23 AM PGY-1 , [facility]

## 2023-04-29 NOTE — Progress Notes (Signed)
    SUBJECTIVE:   CHIEF COMPLAINT / HPI:   Right knee pain Patient presents with right knee pain x 4 days.  States that she is doing physical therapy twice a week for her back, and up physical therapy on Friday they moved her knee and she felt and heard a pop.  States that she has had pain since then.  Worse with standing from a seated position and with ambulation.  Patient is able to ambulate with her walker, this is her baseline.  She has applied Voltaren gel, taken tramadol and gabapentin with improvement of her pain. Patient does note history of arthroscopic surgery to her right knee after a fall many years ago.  States she received cortisone injections at that time. Patient denies numbness, tingling, weakness.  No other concerns voiced at this time.  PERTINENT  PMH / PSH: HTN, lumbar back pain with radiculopathy to RLE, osteoarthritis  OBJECTIVE:   BP (!) 137/50   Pulse 67   Ht 5\' 11"  (1.803 m)   Wt 296 lb 8 oz (134.5 kg)   SpO2 100%   BMI 41.35 kg/m   MSK: Edema noted over right patellar region, right> left.  Right knee pain worse with extension and flexion. Negative Lachman's and McMurray's tests. No redness, warmth noted to bilateral knees.  Chronic 2+ edema to bilateral lower extremities symmetric. Bedside US performed by Dr. Velna Ochs with no effusion seen.   ASSESSMENT/PLAN:   Right knee pain Right knee pain after suspected injury at physical therapy 3 days ago.  Last x-ray of her right knee was in 2011 that showed mild medial compartment osteoarthritis.  History of surgery to this knee and cortisone injections.  No suspicion for gout or infectious arthritis at this time given no erythema or warmth to the knee.  I suspect that this is acute inflammation from physical therapy in the setting of chronic osteoarthritis to the knee. -Continue tramadol, gabapentin, Voltaren gel -Ultrasound today by Dr. Velna Ochs showed no effusion -Follow-up in 2 weeks if pain persists, consider  cortisone injections or additional imaging at that time  HYPERTENSION, BENIGN SYSTEMIC Patient initial and repeat blood pressures elevated today.  She does note that she did not take her blood pressure medication today.    Para March, DO [facility]

## 2023-04-29 NOTE — Assessment & Plan Note (Signed)
Patient initial and repeat blood pressures elevated today.  She does note that she did not take her blood pressure medication today.

## 2023-05-14 ENCOUNTER — Ambulatory Visit: Payer: Medicare Other | Admitting: Family Medicine

## 2023-05-17 ENCOUNTER — Ambulatory Visit (INDEPENDENT_AMBULATORY_CARE_PROVIDER_SITE_OTHER): Payer: Medicare Other | Admitting: Family Medicine

## 2023-05-17 VITALS — BP 135/82 | HR 89 | Wt 298.0 lb

## 2023-05-17 DIAGNOSIS — M25561 Pain in right knee: Secondary | ICD-10-CM | POA: Diagnosis not present

## 2023-05-17 MED ORDER — METHYLPREDNISOLONE ACETATE 40 MG/ML IJ SUSP
40.0000 mg | Freq: Once | INTRAMUSCULAR | Status: AC
Start: 2023-05-17 — End: 2023-05-17
  Administered 2023-05-17: 40 mg via INTRAMUSCULAR

## 2023-05-17 NOTE — Patient Instructions (Addendum)
It was great to see you today! Thank you for choosing [facility] for your primary care. Brianna Wheeler was seen for knee injection.  Today you received an injection with corticosteroid. This injection is usually done in response to pain and inflammation. There is some "numbing" medicine also in the shot so the injected area may be numb and feel really good for the next couple of hours. The numbing medicine usually wears off in 2-3 hours though, and then your pain level will be right back where it was before the injection.   The actually benefit from the steroid injection is usually noticed in 2-7 days. You may actually experience a small (as in 10%) INCREASE in pain in the first 24 hours---that is common.   Things to watch out for that you should contact us or a health care provider urgently would include: 1. Unusual (as in more than 10%) increase in pain 2. New fever > 101.5 3. New swelling or redness of the injected area.  4. Streaking of red lines around the area injected.  Follow up in 2-4 weeks   If you haven't already, sign up for My Chart to have easy access to your labs results, and communication with your primary care physician.  Call the clinic at [phone] if your symptoms worsen or you have any concerns.  Please arrive 15 minutes before your appointment to ensure smooth check in process.  We appreciate your efforts in making this happen.  Thank you for allowing me to participate in your care, Para March, DO 05/17/2023, 10:35 AM PGY-1, [facility]

## 2023-05-17 NOTE — Progress Notes (Signed)
    SUBJECTIVE:   CHIEF COMPLAINT / HPI:   Patient presents for knee injection.  She is having acute on chronic right knee pain, history of surgery to this knee after fall many years ago and she received knee injections at that time.  Denies fevers, redness, warmth to the knee.  PERTINENT  PMH / PSH: Osteoarthritis  OBJECTIVE:   BP 135/82   Pulse 89   Wt 298 lb (135.2 kg)   SpO2 96%   BMI 41.56 kg/m   MSK: Right knee no tenderness to palpation, able to ambulate with walker.  Mild edema over patellar region. No erythema or warmth. No signs of infection  ASSESSMENT/PLAN:   Right knee pain PROCEDURE: INJECTION: Patient was given informed consent, signed copy in the chart. Appropriate time out was taken. Area prepped and draped in usual sterile fashion. Ethyl chloride was  used for local anesthesia. A 21 gauge 1 1/2 inch needle was used.. 1 cc of methylprednisolone 40 mg/ml plus  2 cc of 1% lidocaine without epinephrine was injected into the left peripatellar region using a(n) anterior approach.   The patient tolerated the procedure well. There were no complications. Post procedure instructions were given.     Para March, DO [facility]

## 2023-05-17 NOTE — Assessment & Plan Note (Signed)
PROCEDURE: INJECTION: Patient was given informed consent, signed copy in the chart. Appropriate time out was taken. Area prepped and draped in usual sterile fashion. Ethyl chloride was  used for local anesthesia. A 21 gauge 1 1/2 inch needle was used.. 1 cc of methylprednisolone 40 mg/ml plus  2 cc of 1% lidocaine without epinephrine was injected into the left peripatellar region using a(n) anterior approach.   The patient tolerated the procedure well. There were no complications. Post procedure instructions were given.

## 2023-05-27 ENCOUNTER — Ambulatory Visit (INDEPENDENT_AMBULATORY_CARE_PROVIDER_SITE_OTHER): Payer: Medicare Other | Admitting: Family Medicine

## 2023-05-27 ENCOUNTER — Encounter: Payer: Self-pay | Admitting: Family Medicine

## 2023-05-27 VITALS — BP 121/74 | HR 94 | Ht 71.0 in | Wt 291.1 lb

## 2023-05-27 DIAGNOSIS — M25561 Pain in right knee: Secondary | ICD-10-CM

## 2023-05-27 DIAGNOSIS — M150 Primary generalized (osteo)arthritis: Secondary | ICD-10-CM

## 2023-05-27 NOTE — Patient Instructions (Addendum)
Please visit [place] hospital to get your x ray done  Please continue your current medications for knee pain. You can also try compression sleeves or wraps  Please let us know if your knee becomes worse or more swollen/red

## 2023-05-27 NOTE — Assessment & Plan Note (Addendum)
Suspect most likely due to chronic arthritis in her knee.  Will obtain updated x-ray given that it has been several years since her last 1.  However, given reported history of her injury there is some concern for meniscal/ligamentous injury.  Reassuringly, her exam is unremarkable aside from some point tenderness.  No obvious tear or effusion noted on limited POCUS.  Advised to continue current regimen for pain and encouraged to try compression and/or icing.  She has knee exercises at home as well.  If worsening pain or swelling, may consider MRI.  Patient does not wish to undergo physical therapy at this time.

## 2023-05-27 NOTE — Progress Notes (Signed)
    SUBJECTIVE:   CHIEF COMPLAINT / HPI:   F/u for R knee pain, received injection 10/25  Known OA with hx of surgery and cortisone injections Takes tramadol, gabapentin, voltaren  Initial injury occurred with physical therapy when she heard a pop sound.  Did not notice significant swelling or erythema immediately afterwards  Injection helped temporarily but yesterday started having worsening R knee pain Able to ambulate with assistance of walker Denies locking of knee  PERTINENT  PMH / PSH: HTN, OA  OBJECTIVE:   BP 121/74   Pulse 94   Ht 5\' 11"  (1.803 m)   Wt 291 lb 2 oz (132.1 kg)   SpO2 99%   BMI 40.60 kg/m    General: NAD, pleasant, able to participate in exam Respiratory: No respiratory distress Skin: warm and dry, no rashes noted Psych: Normal affect and mood   R knee:  Inspection: No gross deformity, ecchymosis, discoloration or erythema.  Mild swelling of right knee although appears symmetric with left Palpation: Point tenderness over medial joint line and anterior joint line.  Otherwise nontender throughout  ROM: Full passive and active range of motion without locking Strength: 5/5 Special tests: Negative anterior/posterior drawers, negative Apley's, negative varus/valgus stress  Limited point-of-care ultrasound done of the right knee which showed no significant effusion although unable to fully visualize structures due to body habitus  ASSESSMENT/PLAN:   Assessment & Plan Acute pain of right knee Suspect most likely due to chronic arthritis in her knee.  Will obtain updated x-ray given that it has been several years since her last 1.  However, given reported history of her injury there is some concern for meniscal/ligamentous injury.  Reassuringly, her exam is unremarkable aside from some point tenderness.  No obvious tear or effusion noted on limited POCUS.  Advised to continue current regimen for pain and encouraged to try compression and/or icing.  She has  knee exercises at home as well.  If worsening pain or swelling, may consider MRI.  Patient does not wish to undergo physical therapy at this time.   Vonna Drafts, MD [facility]

## 2023-05-30 ENCOUNTER — Ambulatory Visit: Payer: Medicare Other | Admitting: Family Medicine

## 2023-06-02 IMAGING — MG DIGITAL SCREENING BILAT W/ CAD
4 series · 4 of 4 positions shown · non-contrast
Comparison: Previous exam(s).

ACR Breast Density Category a: The breast tissue is almost entirely
fatty.

CLINICAL DATA: Screening.

EXAM:
DIGITAL SCREENING BILATERAL MAMMOGRAM WITH CAD
TECHNIQUE: Bilateral screening digital craniocaudal and mediolateral oblique
mammograms were obtained. The images were evaluated with
computer-aided detection.

[R MLO]
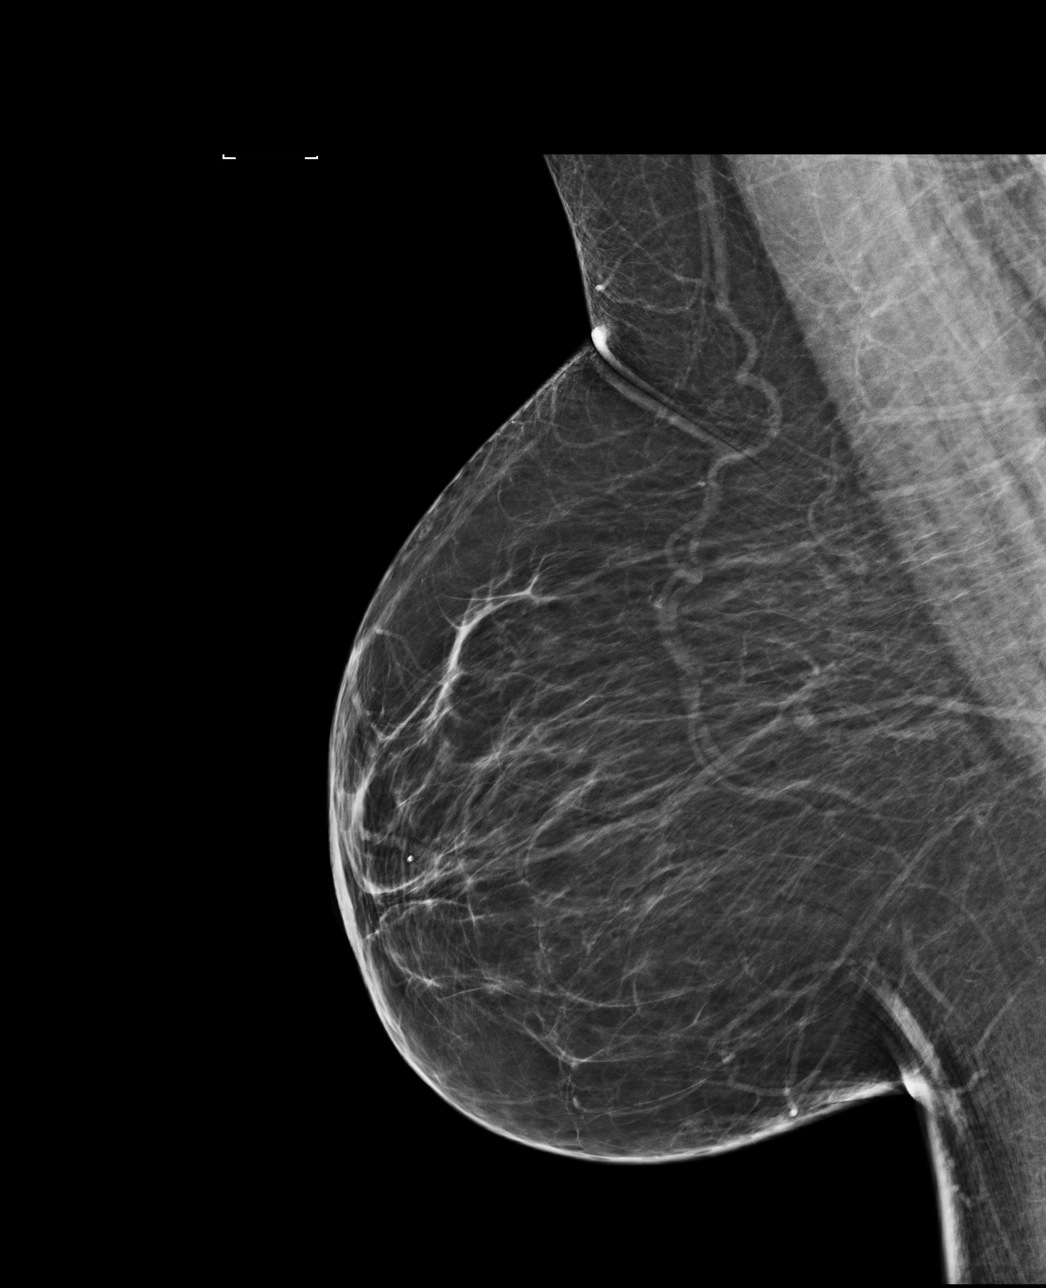

[L MLO]
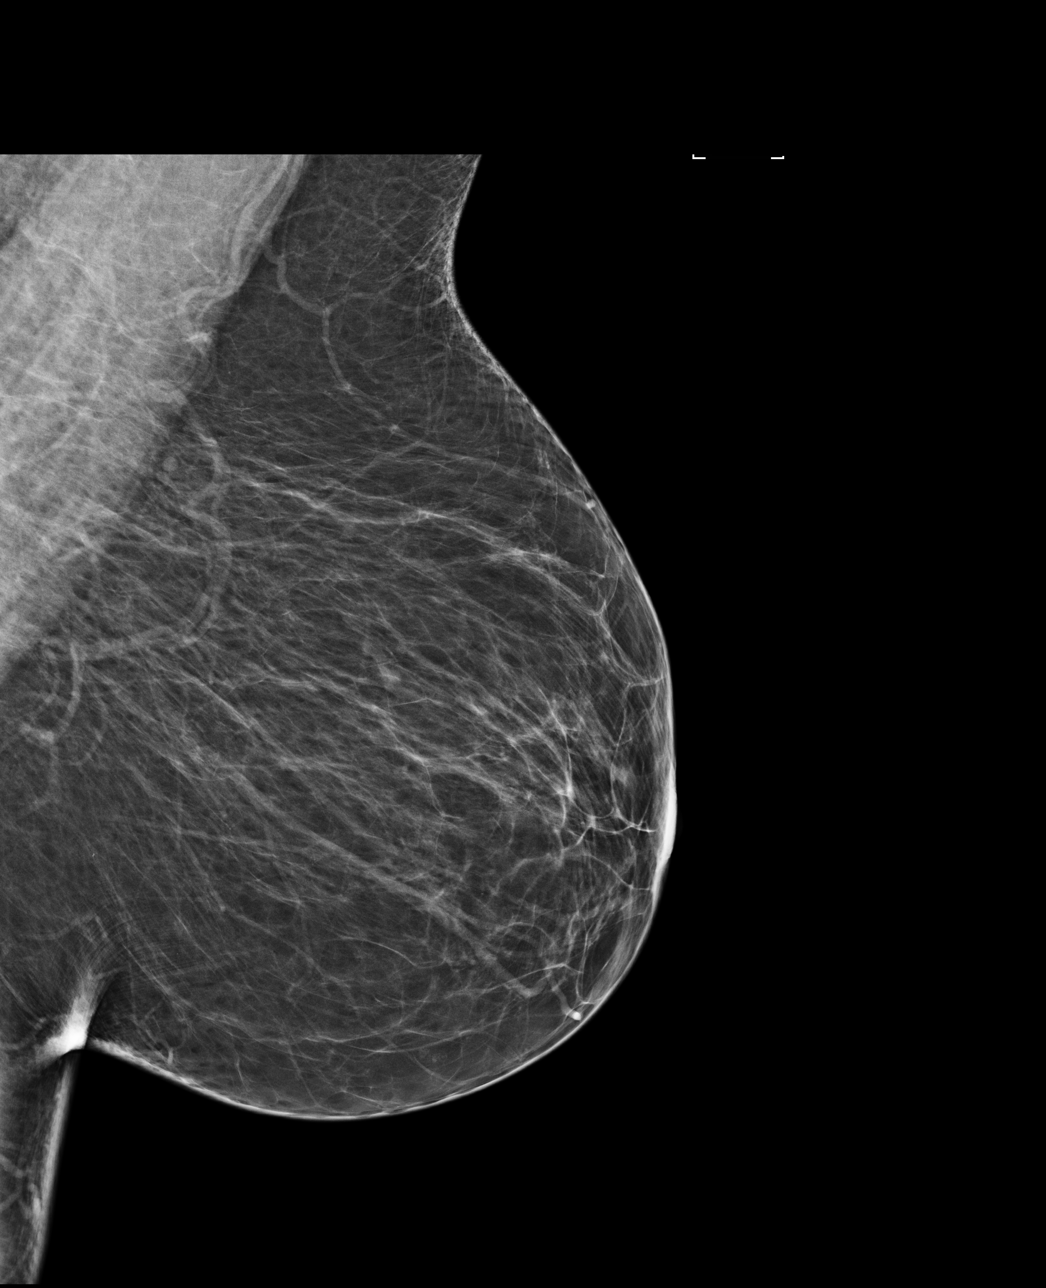

[R CC]
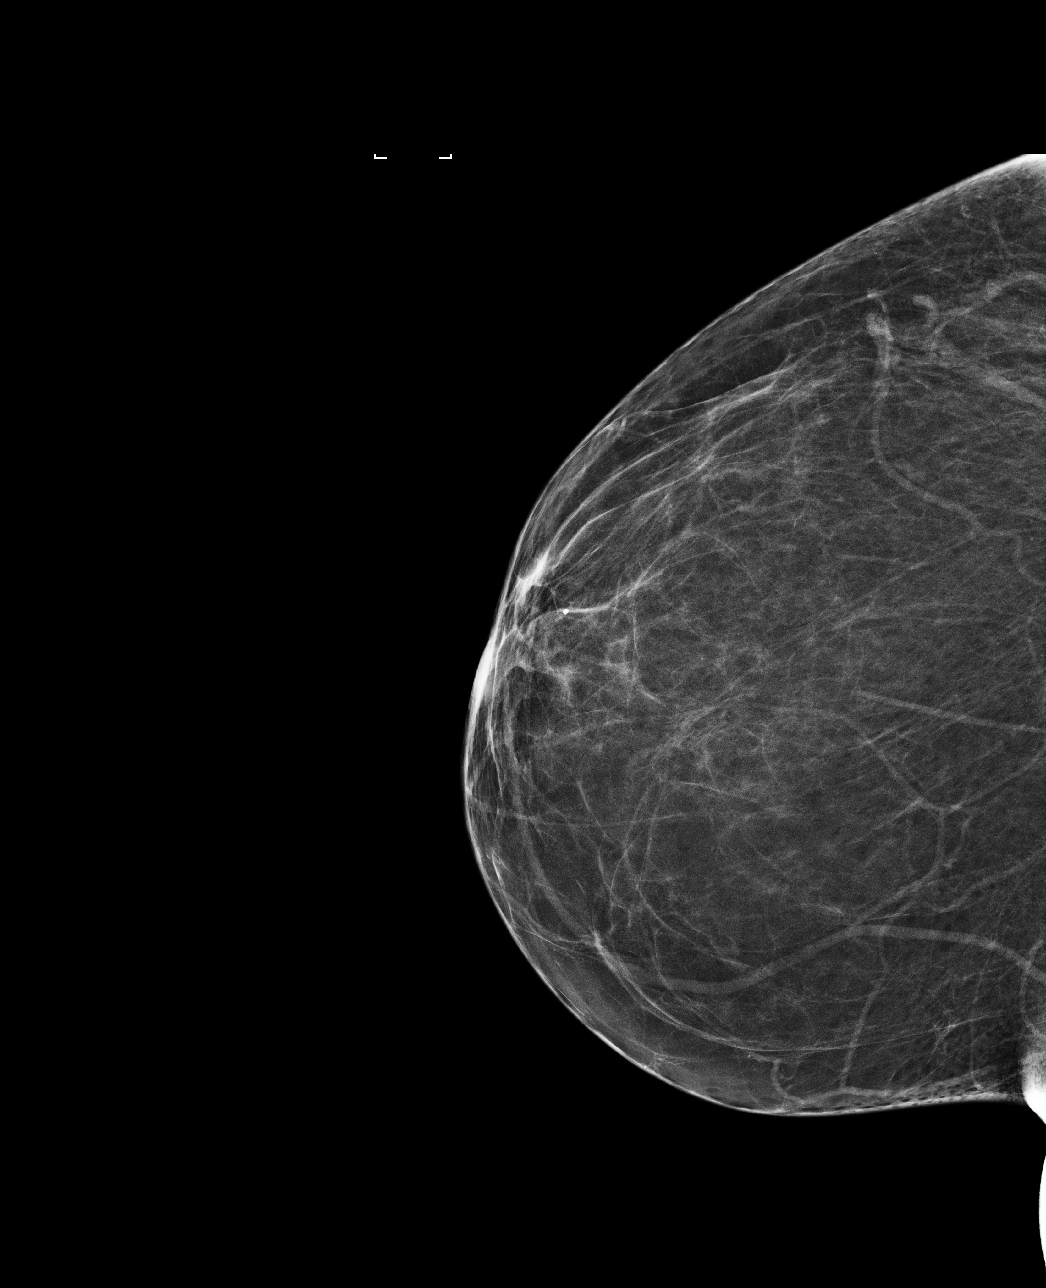

[L CC]
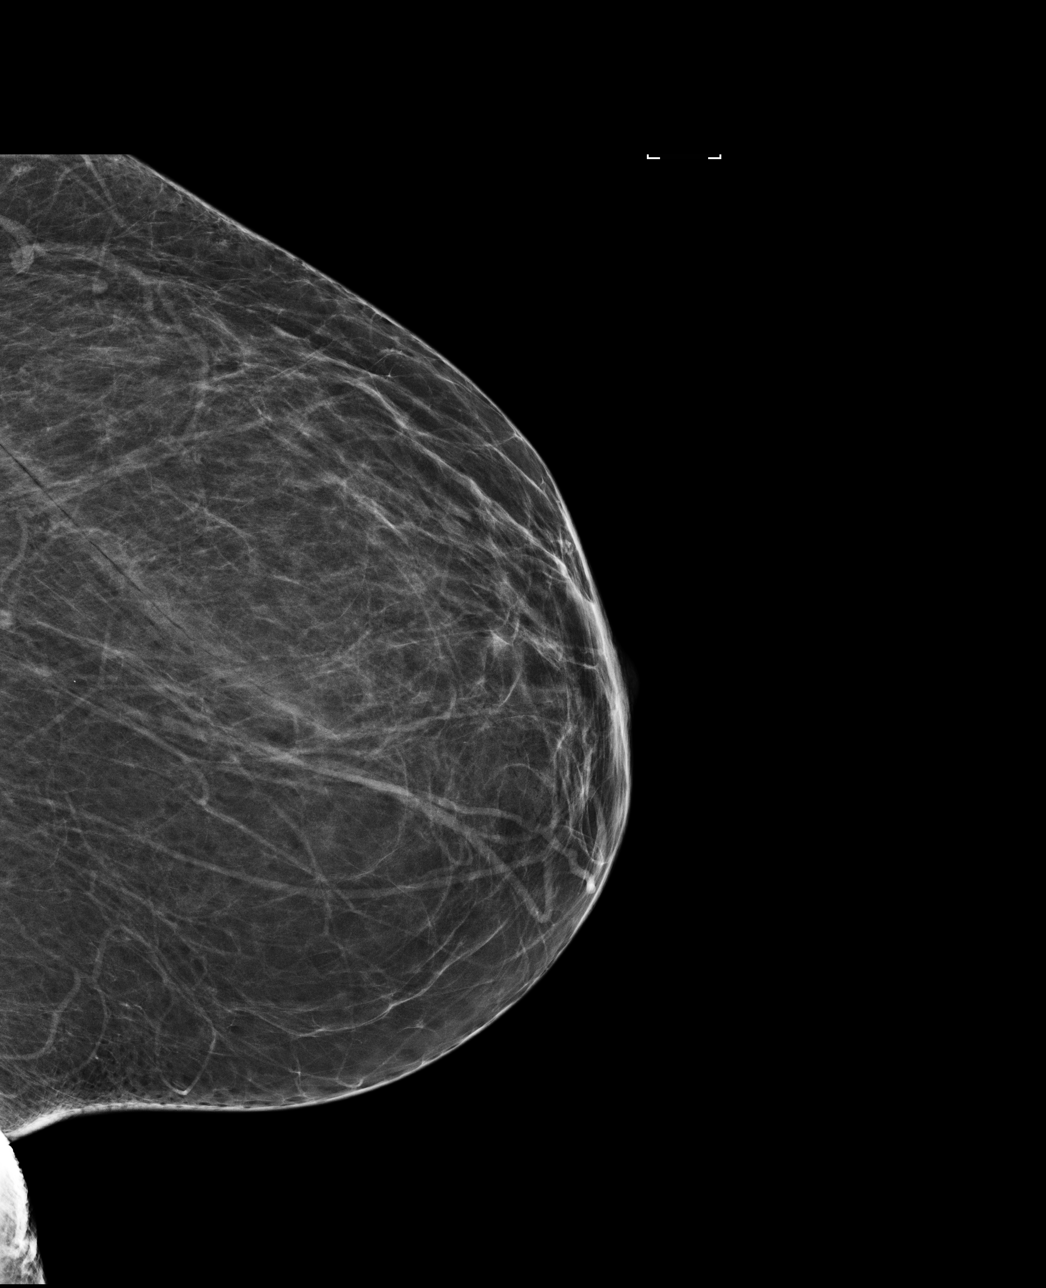

[4 of 4 positions shown; findings below may reference images not displayed]

FINDINGS: There are no findings suspicious for malignancy.
IMPRESSION: No mammographic evidence of malignancy. A result letter of this
screening mammogram will be mailed directly to the patient.

RECOMMENDATION:
Screening mammogram in one year. (Code:9G-A-2OA)

BI-RADS CATEGORY  1: Negative.

## 2023-06-04 ENCOUNTER — Emergency Department (HOSPITAL_COMMUNITY)
Admission: EM | Admit: 2023-06-04 | Discharge: 2023-06-04 | Disposition: A | Discharge status: Home / Self Care | Payer: Medicare Other | Attending: Emergency Medicine | Admitting: Emergency Medicine

## 2023-06-04 ENCOUNTER — Other Ambulatory Visit: Payer: Self-pay

## 2023-06-04 ENCOUNTER — Encounter (HOSPITAL_COMMUNITY): Payer: Self-pay | Admitting: *Deleted

## 2023-06-04 DIAGNOSIS — E119 Type 2 diabetes mellitus without complications: Secondary | ICD-10-CM | POA: Diagnosis not present

## 2023-06-04 DIAGNOSIS — E039 Hypothyroidism, unspecified: Secondary | ICD-10-CM | POA: Diagnosis not present

## 2023-06-04 DIAGNOSIS — Z743 Need for continuous supervision: Secondary | ICD-10-CM | POA: Diagnosis not present

## 2023-06-04 DIAGNOSIS — W010XXA Fall on same level from slipping, tripping and stumbling without subsequent striking against object, initial encounter: Secondary | ICD-10-CM | POA: Diagnosis not present

## 2023-06-04 DIAGNOSIS — I10 Essential (primary) hypertension: Secondary | ICD-10-CM | POA: Insufficient documentation

## 2023-06-04 DIAGNOSIS — R0902 Hypoxemia: Secondary | ICD-10-CM | POA: Diagnosis not present

## 2023-06-04 DIAGNOSIS — M549 Dorsalgia, unspecified: Secondary | ICD-10-CM | POA: Diagnosis not present

## 2023-06-04 DIAGNOSIS — W19XXXA Unspecified fall, initial encounter: Secondary | ICD-10-CM | POA: Diagnosis not present

## 2023-06-04 DIAGNOSIS — M545 Low back pain, unspecified: Secondary | ICD-10-CM | POA: Diagnosis not present

## 2023-06-04 NOTE — ED Triage Notes (Signed)
Pt arrives via [facility] from [facility]. Per EMS report, pt was walking around her car at a restaurant. Normally walks with a walker, was not using walker tonight, fell to her knees. Couldn't make it into the drivers door due to weakness in her legs, called EMS. No thinners, no loc, did not hit her head. Vitals en route hr 80, 98% cbg 96, 126/80

## 2023-06-04 NOTE — ED Provider Notes (Signed)
[facility] AT [facility] Provider Note   CSN: [id number] Arrival date & time: 06/04/23  2200     History  Chief Complaint  Patient presents with   Brianna Wheeler    Brianna Wheeler is a 67 y.o. female.   Fall   67 year old female presents emergency department after a fall.  Patient states that she usually walks with a walker.  Was leaving [employer] trying to walk to her car.  States that she do not have her walker with her.  States that she lost her footing and fell backwards landing on her bottom.  Denies trauma to head, loss of consciousness.  Was having low back pain that since has resolved.  States that she tried to told EMS that she did not want to come to the emergency department but they brought her here anyways.  States that she is currently not having any pain.  Denies any chest pain, shortness of breath, abdominal pain, nausea, vomiting, back pain, upper or lower extremity pain.  .  Past medical history significant for diabetes mellitus, hypertension, hyperlipidemia, hypothyroidism, GERD, morbid obesity  Home Medications Prior to Admission medications   Medication Sig Start Date End Date Taking? Authorizing Provider  albuterol (VENTOLIN HFA) 108 (90 Base) MCG/ACT inhaler Inhale 2 puffs into the lungs every 6 (six) hours as needed for wheezing or shortness of breath. 09/11/22   Alfonse Spruce, MD  baclofen (LIORESAL) 10 MG tablet Take 1 tablet (10 mg total) by mouth 3 (three) times daily as needed for muscle spasms. 02/08/20   [name], MD  benzonatate (TESSALON) 100 MG capsule Take 1 capsule (100 mg total) by mouth 2 (two) times daily as needed for cough. 03/17/22   [name], MD  Calcium Carb-Cholecalciferol (CALCIUM 1000 + D PO) Take by mouth.    [provider]  diclofenac Sodium (VOLTAREN) 1 % GEL Apply 2 g topically 4 (four) times daily. 05/15/22   [name], MD  Elastic Bandages & Supports (MEDICAL  COMPRESSION STOCKINGS) MISC Use daily as needed for leg swelling. Size:Large. Pressure setting: 20 - 30 mmHg 11/28/22   Janit Pagan T, MD  fexofenadine (ALLEGRA) 180 MG tablet Take 1 tablet (180 mg total) by mouth daily. 09/11/22 12/10/22  Alfonse Spruce, MD  fluticasone [facility]) 50 MCG/ACT nasal spray Place 2 sprays into both nostrils daily. Place 2 sprays in each nostril daily 09/11/22   Alfonse Spruce, MD  gabapentin (NEURONTIN) 100 MG capsule Take 1 capsule (100 mg total) by mouth 3 (three) times daily. 06/28/22   [name], MD  hydrochlorothiazide (HYDRODIURIL) 12.5 MG tablet Take 1 tablet (12.5 mg total) by mouth daily. 02/11/23   Caro Laroche, DO  levothyroxine (SYNTHROID) 125 MCG tablet Take 1 tablet (125 mcg total) by mouth daily before breakfast. 02/27/23   Caro Laroche, DO  Multiple Vitamin (MULTIVITAMIN) tablet Take 1 tablet by mouth daily.    [provider]  omeprazole (PRILOSEC) 20 MG capsule Take 1 capsule (20 mg total) by mouth daily. 04/10/23   Caro Laroche, DO  potassium chloride SA (KLOR-CON M20) 20 MEQ tablet Take 1 tablet (20 mEq total) by mouth daily. 02/20/22   [name], MD  rosuvastatin (CRESTOR) 20 MG tablet Take 1 tablet by mouth once daily 01/31/23   Caro Laroche, DO  traMADol (ULTRAM) 50 MG tablet Take 1 tablet (50 mg total) by mouth 3 (three) times daily as needed.  10/30/22   Doreene Eland, MD      Allergies    Hydrocodone-acetaminophen, Nsaids, Oxycodone-acetaminophen, Tetracycline, and Bactrim [sulfamethoxazole-trimethoprim]    Review of Systems   Review of Systems  All other systems reviewed and are negative.   Physical Exam Updated Vital Signs BP 121/71 (BP Location: Right Arm)   Pulse 84   Temp 98.3 F (36.8 C)   Resp 16   Ht 5\' 11"  (1.803 m)   Wt 132 kg   SpO2 99%   BMI 40.59 kg/m  Physical Exam Vitals and nursing note reviewed.  Constitutional:      General: She is not in acute distress.     Appearance: She is well-developed.  HENT:     Head: Normocephalic and atraumatic.  Eyes:     Conjunctiva/sclera: Conjunctivae normal.  Cardiovascular:     Rate and Rhythm: Normal rate and regular rhythm.  Pulmonary:     Effort: Pulmonary effort is normal. No respiratory distress.     Breath sounds: Normal breath sounds. No wheezing, rhonchi or rales.  Abdominal:     Palpations: Abdomen is soft.     Tenderness: There is no abdominal tenderness.  Musculoskeletal:        General: No swelling.     Cervical back: Neck supple.     Comments: No midline tenderness of cervical, thoracic, lumbar spine without step-off or deformity noted.  Notes paraspinal tenderness noted throughout spine.  No chest wall tenderness.  Patient moves all 4 extremities without difficulty. No overlying tenderness of upper or lower extremities.  Bilateral lower extremity swelling which is baseline.  Skin:    General: Skin is warm and dry.     Capillary Refill: Capillary refill takes less than 2 seconds.  Neurological:     Mental Status: She is alert.     Comments: Alert and oriented to self, place, time and event.   Speech is fluent, clear without dysarthria or dysphasia.   Strength symmetric in upper/lower extremities   Sensation intact in upper/lower extremities   CN I not tested  CN II grossly intact visual fields bilaterally. Did not visualize posterior eye.  CN III, IV, VI PERRLA and EOMs intact right side.  Nonreactive left pupil with opaque appearing pupil; this is patient's baseline CN V Intact sensation to sharp and light touch to the face  CN VII facial movements symmetric  CN VIII not tested  CN IX, X no uvula deviation, symmetric rise of soft palate  CN XI 5/5 SCM and trapezius strength bilaterally  CN XII Midline tongue protrusion, symmetric L/R movements     Psychiatric:        Mood and Affect: Mood normal.     ED Results / Procedures / Treatments   Labs (all labs ordered are listed, but  only abnormal results are displayed) Labs Reviewed - No data to display  EKG None  Radiology No results found.  Procedures Procedures    Medications Ordered in ED Medications - No data to display  ED Course/ Medical Decision Making/ A&P                                 Medical Decision Making  This patient presents to the ED for concern of fall, this involves an extensive number of treatment options, and is a complaint that carries with it a high risk of complications and morbidity.  The differential diagnosis includes CVA, fracture,  strain/strain, dislocation, pneumothorax, solid organ damage, other   Co morbidities that complicate the patient evaluation  See HPI   Additional history obtained:  Additional history obtained from EMR External records from outside source obtained and reviewed including hospital records   Lab Tests:  Patient declined  Imaging Studies ordered:  Patient declined   Cardiac Monitoring: / EKG:  The patient was maintained on a cardiac monitor.  I personally viewed and interpreted the cardiac monitored which showed an underlying rhythm of: Sinus rhythm   Consultations Obtained:  N/a   Problem List / ED Course / Critical interventions / Medication management  Fall Reevaluation of the patient showed that the patient stayed the same I have reviewed the patients home medicines and have made adjustments as needed   Social Determinants of Health:  Denies tobacco, illicit drug use   Test / Admission - Considered:  Fall Vitals signs within normal range and stable throughout visit. 67 year old female presents emergency department after mechanical fall when she lost her footing trying to get to her car when she did not have her walker with her.  Patient initially reported low back pain when she fell but since resolved.  States that she did not want to come to the hospital but came because EMS brought her.  On exam, patient without any  clinical evidence of trauma or reproducible tenderness on exam.  Offered patient imaging of lumbar spine given that she was having tenderness they are from fall although not currently experiencing any pain.  Patient declined.  States that she just wants to go home without any additional workup.  This seemed reasonable given reassuring physical exam.  Will recommend treatment of pain at home with Tylenol and follow-up with primary care for reassessment.  Treatment plan discussed at length with patient and she acknowledged understanding was agreeable to said plan.  Patient overall well-appearing, afebrile in no acute distress. Worrisome signs and symptoms were discussed with the patient, and the patient acknowledged understanding to return to the ED if noticed. Patient was stable upon discharge.          Final Clinical Impression(s) / ED Diagnoses Final diagnoses:  Fall, initial encounter    Rx / DC Orders ED Discharge Orders     None         Peter Garter, [place] 06/04/23 2245    Lorre Nick, MD 06/19/23 [phone]

## 2023-06-04 NOTE — Discharge Instructions (Signed)
As discussed, recommend follow-up with primary care for reassessment.  Use walker when walking at home to decrease likelihood of falls.  Please do not hesitate to return to the emergency department if there are worrisome signs and symptoms we discussed become apparent.

## 2023-06-06 ENCOUNTER — Other Ambulatory Visit: Payer: Self-pay | Admitting: Family Medicine

## 2023-06-17 ENCOUNTER — Ambulatory Visit: Payer: Medicare Other | Admitting: Family Medicine

## 2023-06-17 ENCOUNTER — Other Ambulatory Visit: Payer: Self-pay

## 2023-06-17 DIAGNOSIS — E876 Hypokalemia: Secondary | ICD-10-CM

## 2023-06-17 MED ORDER — POTASSIUM CHLORIDE CRYS ER 20 MEQ PO TBCR
20.0000 meq | EXTENDED_RELEASE_TABLET | Freq: Every day | ORAL | 3 refills | Status: DC
Start: 2023-06-17 — End: 2023-12-10

## 2023-06-17 NOTE — Progress Notes (Deleted)
    SUBJECTIVE:   CHIEF COMPLAINT / HPI:   ER FOLLOW UP Hospital/facility: [facility] ED 11/12 Diagnosis: fall.  - lost footing coming out of restaurant. Did not have walker with her. Procedures/tests:  Consultants:  New medications:  Discharge instructions:   Status: {Blank multiple:19196::"better","worse","stable","fluctuating"}   OBJECTIVE:   There were no vitals taken for this visit.  ***  ASSESSMENT/PLAN:   No problem-specific Assessment & Plan notes found for this encounter.     Caro Laroche, DO

## 2023-06-19 ENCOUNTER — Other Ambulatory Visit: Payer: Self-pay | Admitting: Family Medicine

## 2023-06-19 DIAGNOSIS — M5441 Lumbago with sciatica, right side: Secondary | ICD-10-CM

## 2023-06-24 ENCOUNTER — Telehealth: Payer: Self-pay

## 2023-06-24 NOTE — Telephone Encounter (Signed)
Transition Care Management Unsuccessful Follow-up Telephone Call  Date of discharge and from where:  [facility] 11/12  Attempts:  1st Attempt  Reason for unsuccessful TCM follow-up call:  No answer/busy   Lenard Forth [place]  [facility], [facility] Guide, Phone: [phone] Website: [email or web address]

## 2023-06-25 ENCOUNTER — Telehealth: Payer: Self-pay

## 2023-06-25 NOTE — Telephone Encounter (Signed)
Transition Care Management Unsuccessful Follow-up Telephone Call  Date of discharge and from where:  [facility] 11/12  Attempts:  2nd Attempt  Reason for unsuccessful TCM follow-up call:  No answer/busy   Lenard Forth [place]  [facility], [facility] Guide, Phone: [phone] Website: [email or web address]

## 2023-07-05 ENCOUNTER — Other Ambulatory Visit: Payer: Self-pay

## 2023-07-06 MED ORDER — LEVOTHYROXINE SODIUM 125 MCG PO TABS: 125.0000 ug | ORAL_TABLET | Freq: Every day | ORAL | 0 refills | Status: DC

## 2023-09-13 ENCOUNTER — Other Ambulatory Visit: Payer: Self-pay | Admitting: Allergy & Immunology

## 2023-09-13 ENCOUNTER — Other Ambulatory Visit: Payer: Self-pay | Admitting: Family Medicine

## 2023-09-13 DIAGNOSIS — M5441 Lumbago with sciatica, right side: Secondary | ICD-10-CM

## 2023-09-19 ENCOUNTER — Ambulatory Visit: Payer: Medicare Other | Admitting: Family Medicine

## 2023-09-27 ENCOUNTER — Ambulatory Visit: Payer: Medicare Other | Admitting: Family Medicine

## 2023-09-30 ENCOUNTER — Ambulatory Visit: Payer: Medicare Other

## 2023-09-30 VITALS — Ht 71.0 in | Wt 305.0 lb

## 2023-09-30 DIAGNOSIS — Z0000 Encounter for general adult medical examination without abnormal findings: Secondary | ICD-10-CM | POA: Diagnosis not present

## 2023-09-30 DIAGNOSIS — Z1211 Encounter for screening for malignant neoplasm of colon: Secondary | ICD-10-CM

## 2023-09-30 NOTE — Patient Instructions (Signed)
 [name] , Thank you for taking time to come for your Medicare Wellness Visit. I appreciate your ongoing commitment to your health goals. Please review the following plan we discussed and let me know if I can assist you in the future.   Referrals/Orders/Follow-Ups/Clinician Recommendations: Yes; Keep maintaining your health by keeping your appointments with Dr. Linwood Dibbles and any specialists that you may see.  Call us if you need anything.  Have a great year!!!!  This is a list of the screening recommended for you and due dates:  Health Maintenance  Topic Date Due   Colon Cancer Screening  Never done   Zoster (Shingles) Vaccine (1 of 2) Never done   Pneumonia Vaccine (3 of 3 - PPSV23 or PCV20) 01/11/2021   Mammogram  01/15/2023   COVID-19 Vaccine (5 - 2024-25 season) 03/24/2023   Lipid (cholesterol) test  11/02/2023   Medicare Annual Wellness Visit  09/29/2024   DTaP/Tdap/Td vaccine (3 - Td or Tdap) 12/30/2024   Flu Shot  Completed   DEXA scan (bone density measurement)  Completed   Hepatitis C Screening  Completed   HPV Vaccine  Aged Out    Advanced directives: (Declined) Advance directive discussed with you today. Even though you declined this today, please call our office should you change your mind, and we can give you the proper paperwork for you to fill out.  Next Medicare Annual Wellness Visit scheduled for next year: Yes

## 2023-09-30 NOTE — Addendum Note (Signed)
 Addended by: Caro Laroche on: 09/30/2023 03:26 PM   Modules accepted: Orders

## 2023-09-30 NOTE — Progress Notes (Signed)
 Subjective:   Brianna Wheeler is a 68 y.o. who presents for a Medicare Wellness preventive visit.  Visit Complete: Virtual I connected with  Brianna Wheeler on 09/30/23 by a audio enabled telemedicine application and verified that I am speaking with the correct person using two identifiers.  Patient Location: Home  Provider Location: Office/Clinic  I discussed the limitations of evaluation and management by telemedicine. The patient expressed understanding and agreed to proceed.  Vital Signs: Because this visit was a virtual/telehealth visit, some criteria may be missing or patient reported. Any vitals not documented were not able to be obtained and vitals that have been documented are patient reported.  VideoDeclined- This patient declined Librarian, academic. Therefore the visit was completed with audio only.  AWV Questionnaire: No: Patient Medicare AWV questionnaire was not completed prior to this visit.  Cardiac Risk Factors include: advanced age (>38men, >94 women);dyslipidemia;family history of premature cardiovascular disease;hypertension;obesity (BMI >30kg/m2)     Objective:    Today's Vitals   09/30/23 1303 09/30/23 1313  Weight: (!) 305 lb (138.3 kg)   Height: 5\' 11"  (1.803 m)   PainSc: 6  6   PainLoc: Back    Body mass index is 42.54 kg/m.     09/30/2023    1:11 PM 05/27/2023    1:32 PM 04/29/2023   10:54 AM 12/31/2022    3:06 PM 11/02/2022   10:09 AM 09/07/2022    1:42 PM 04/05/2022    3:19 PM  Advanced Directives  Does Patient Have a Medical Advance Directive? No No No No No No No  Would patient like information on creating a medical advance directive? No - Patient declined No - Patient declined No - Patient declined No - Patient declined  No - Patient declined No - Patient declined    Current Medications (verified) Outpatient Encounter Medications as of 09/30/2023  Medication Sig   albuterol (VENTOLIN HFA) 108 (90 Base) MCG/ACT  inhaler Inhale 2 puffs into the lungs every 6 (six) hours as needed for wheezing or shortness of breath.   baclofen (LIORESAL) 10 MG tablet Take 1 tablet (10 mg total) by mouth 3 (three) times daily as needed for muscle spasms.   benzonatate (TESSALON) 100 MG capsule Take 1 capsule (100 mg total) by mouth 2 (two) times daily as needed for cough.   Calcium Carb-Cholecalciferol (CALCIUM 1000 + D PO) Take by mouth.   diclofenac Sodium (VOLTAREN) 1 % GEL Apply 2 g topically 4 (four) times daily.   Elastic Bandages & Supports (MEDICAL COMPRESSION STOCKINGS) MISC Use daily as needed for leg swelling. Size:Large. Pressure setting: 20 - 30 mmHg   fexofenadine (ALLEGRA) 180 MG tablet Take 1 tablet (180 mg total) by mouth daily.   fluticasone (FLONASE) 50 MCG/ACT nasal spray Place 2 sprays into both nostrils daily. Place 2 sprays in each nostril daily   gabapentin (NEURONTIN) 100 MG capsule Take 1 capsule (100 mg total) by mouth 3 (three) times daily.   hydrochlorothiazide (HYDRODIURIL) 12.5 MG tablet Take 1 tablet (12.5 mg total) by mouth daily.   levothyroxine (EUTHYROX) 125 MCG tablet TAKE 1 TABLET BY MOUTH ONCE DAILY BEFORE BREAKFAST. NEED  TSH  CHECK  AND  OFFICE  VISIT  BEFORE  NEXT  REFILL.   Multiple Vitamin (MULTIVITAMIN) tablet Take 1 tablet by mouth daily.   omeprazole (PRILOSEC) 20 MG capsule Take 1 capsule (20 mg total) by mouth daily.   potassium chloride SA (KLOR-CON M20) 20 MEQ tablet Take  1 tablet (20 mEq total) by mouth daily.   rosuvastatin (CRESTOR) 20 MG tablet Take 1 tablet by mouth once daily   traMADol (ULTRAM) 50 MG tablet Take 1 tablet by mouth three times daily as needed   No facility-administered encounter medications on file as of 09/30/2023.    Allergies (verified) Hydrocodone-acetaminophen, Nsaids, Oxycodone-acetaminophen, Tetracycline, and Bactrim [sulfamethoxazole-trimethoprim]   History: Past Medical History:  Diagnosis Date   Diabetes mellitus    Hyperlipidemia     Hypertension    Left foot pain 2005   Chronic injury -- multiple broken bones and torn ligaments after accident in garage   Past Surgical History:  Procedure Laterality Date   EYE SURGERY     LUMBAR DISC SURGERY     TUBAL LIGATION     Family History  Problem Relation Age of Onset   Cancer Sister        brain   Diabetes Sister    Heart disease Sister    Heart attack Sister    Epilepsy Son    Social History   Socioeconomic History   Marital status: Widowed    Spouse name: Not on file   Number of children: 1   Years of education: 11.5   Highest education level: 11th grade  Occupational History   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    Son suffers from [place]. [facility] patient- Brianna Wheeler.)   Patient has her own vehicle and still drives .   Hobbies: cleaning house, likes home and garden things. Spending time with friends.      Social Drivers of Corporate investment banker Strain: Low Risk  (09/30/2023)   Overall Financial Resource Strain (CARDIA)    Difficulty of Paying Living Expenses: Not hard at all  Food Insecurity: No Food Insecurity (09/30/2023)   Hunger Vital Sign    Worried About Running Out of Food in the Last Year: Never true    Ran Out of Food in the Last Year: Never true  Transportation Needs: No Transportation Needs (09/30/2023)   PRAPARE - Administrator, Civil Service (Medical): No    Lack of Transportation (Non-Medical): No  Physical Activity: Insufficiently Active (09/30/2023)   Exercise Vital Sign    Days of Exercise per Week: 6 days    Minutes of Exercise per Session: 20 min  Stress: No Stress Concern  Present (09/30/2023)   [employer] of Occupational Health - Occupational Stress Questionnaire    Feeling of Stress : Not at all  Social Connections: Moderately Integrated (09/30/2023)   Social Connection and Isolation Panel [NHANES]    Frequency of Communication with Friends and Family: More than three times a week    Frequency of Social Gatherings with Friends and Family: Three times a week    Attends Religious Services: More than 4 times per year    Active Member of Clubs or Organizations: Yes    Attends Banker Meetings: Never    Marital Status: Widowed    Tobacco Counseling Counseling given: Not Answered    Clinical Intake:  Pre-visit preparation completed: Yes  Pain : 0-10 Pain Score: 6  Pain Type: Chronic pain Pain Location: Back Pain Orientation: Lower     BMI - recorded: 42.54 Nutritional Status: BMI > 30  Obese Nutritional Risks: None Diabetes: No  How often do you need to have someone help you when you read instructions, pamphlets, or other written materials from your doctor or pharmacy?: 1 - Never What is the last grade level you completed in school?: HSG  Interpreter Needed?: No  Information entered by :: Susie Cassette, LPN.   Activities of Daily Living     09/30/2023    1:17 PM  In your present state of health, do you have any difficulty performing the following activities:  Hearing? 0  Vision? 0  Difficulty concentrating or making decisions? 0  Walking or climbing stairs? 1  Dressing or bathing? 0  Doing errands, shopping? 0  Preparing Food and eating ? N  Using the Toilet? N  In the past six months, have you accidently leaked urine? Y  Do you have problems with loss of bowel control? N  Managing your Medications? N  Managing your Finances? N  Housekeeping or managing your Housekeeping? Y    Patient Care Team: Caro Laroche, DO as PCP - General (Family Medicine) Antony Contras, MD (Ophthalmology) Eber Jones, MD  as Referring Physician (Ophthalmology) Alfonse Spruce, MD as Consulting Physician (Allergy and Immunology)  Indicate any recent Medical Services you may have received from other than [facility] providers in the past year (date may be approximate).     Assessment:   This is a routine wellness examination for [place].  Hearing/Vision screen Hearing Screening - Comments:: Denies hearing difficulties.   Vision Screening - Comments:: No rx glasses; detached retina removed - up to date with routine eye exams with Antony Contras, MD.    Goals Addressed             This Visit's Progress    Client understands the importance of follow-up with providers by attending scheduled visits.         Depression Screen     09/30/2023    1:14 PM 05/27/2023    1:33 PM 04/29/2023   10:54 AM 02/25/2023    9:53 AM 12/31/2022    3:07 PM 11/02/2022   10:20 AM 09/07/2022    1:27 PM  PHQ 2/9 Scores  PHQ - 2 Score 0 0 5 2 2  0 1  PHQ- 9 Score 0 2 8 6 2 4      Fall Risk     09/30/2023    1:14 PM 05/27/2023    1:33 PM 04/29/2023   10:56 AM 04/03/2023    8:31 AM 02/25/2023    9:52 AM  Fall Risk   Falls in the past year? 1 1 0 0 1  Number falls in past yr: 1 0 0 0 0  Injury with Fall? 1 1 0  0  Risk for fall due to : History of fall(s);Impaired balance/gait;Orthopedic patient    Other (Comment)  Risk for fall due to: Comment     cabinet fall on her  Follow up Falls prevention discussed;Falls evaluation completed        MEDICARE RISK AT HOME:  Medicare Risk at Home Any stairs in or around the home?: Yes ([facility] HAS A RAMP) If so, are there any without handrails?: No Home free of loose throw rugs in walkways, pet beds, electrical cords, etc?: Yes Adequate lighting in your home to reduce risk  of falls?: Yes Life alert?: No Use of a cane, walker or w/c?: Yes Grab bars in the bathroom?: Yes Shower chair or bench in shower?: Yes Elevated toilet seat or a handicapped toilet?: Yes  TIMED UP AND  GO:  Was the test performed?  No  Cognitive Function: 6CIT completed    09/30/2023    1:15 PM 05/27/2018    2:59 PM 09/24/2013    4:00 PM 03/18/2013   10:00 AM 02/12/2012    4:00 PM  MMSE - Mini Mental State Exam  Not completed: Unable to complete      Orientation to time  5 5 5 5   Orientation to Place  5 5 5 5   Registration  3 3 3 3   Attention/ Calculation  5 5 5 5   Recall  3 2 2 3   Language- name 2 objects  2 2 2 2   Language- repeat  1 1 1 1   Language- follow 3 step command  3 3 3 3   Language- read & follow direction  1 1 1 1   Write a sentence  1 1 1 1   Copy design  1 1 1 1   Total score  30 29 29 30         09/30/2023    1:16 PM 09/07/2022    1:33 PM 08/02/2021    3:50 PM 05/27/2018    2:55 PM  6CIT Screen  What Year? 0 points 0 points 0 points 0 points  What month? 0 points 0 points 0 points 0 points  What time? 0 points 0 points 0 points 0 points  Count back from 20 0 points 0 points 0 points 0 points  Months in reverse 0 points 0 points 0 points 0 points  Repeat phrase 0 points 2 points 0 points 0 points  Total Score 0 points 2 points 0 points 0 points    Immunizations Immunization History  Administered Date(s) Administered   Fluad Quad(high Dose 65+) 04/03/2021, 04/09/2022   Fluad Trivalent(High Dose 65+) 03/27/2023   Influenza Split 04/23/2011, 04/17/2012   Influenza Whole 04/23/2007, 04/23/2008, 04/28/2009, 04/25/2010   Influenza,inj,Quad PF,6+ Mos 03/19/2013, 04/01/2014, 03/31/2015, 05/01/2016, 04/02/2017, 03/25/2018, 03/19/2019, 04/20/2020   PFIZER Comirnaty(Gray Top)Covid-19 Tri-Sucrose Vaccine 12/08/2020   PFIZER(Purple Top)SARS-COV-2 Vaccination 04/20/2020, 05/12/2020   Pfizer Covid-19 Vaccine Bivalent Booster 15yrs & up 09/13/2021   Pneumococcal Conjugate-13 01/12/2020   Pneumococcal Polysaccharide-23 05/10/2011   Td 10/22/2003   Tdap 12/31/2014    Screening Tests Health Maintenance  Topic Date Due   Colonoscopy  Never done   Zoster Vaccines-  Shingrix (1 of 2) Never done   Pneumonia Vaccine 74+ Years old (3 of 3 - PPSV23 or PCV20) 01/11/2021   MAMMOGRAM  01/15/2023   COVID-19 Vaccine (5 - 2024-25 season) 03/24/2023   LIPID PANEL  11/02/2023   Medicare Annual Wellness (AWV)  09/29/2024   DTaP/Tdap/Td (3 - Td or Tdap) 12/30/2024   INFLUENZA VACCINE  Completed   DEXA SCAN  Completed   Hepatitis C Screening  Completed   HPV VACCINES  Aged Out    Health Maintenance  Health Maintenance Due  Topic Date Due   Colonoscopy  Never done   Zoster Vaccines- Shingrix (1 of 2) Never done   Pneumonia Vaccine 72+ Years old (3 of 3 - PPSV23 or PCV20) 01/11/2021   MAMMOGRAM  01/15/2023   COVID-19 Vaccine (5 - 2024-25 season) 03/24/2023   Health Maintenance Items Addressed: Yes; Patient is due for Colonoscopy, Mammogram, Pneumonia vaccine, Shingrix vaccine and Covid-19  vaccine. Patient has requested Cologuard testing.  Additional Screening:  Vision Screening: Recommended annual ophthalmology exams for early detection of glaucoma and other disorders of the eye.  Dental Screening: Recommended annual dental exams for proper oral hygiene  Community Resource Referral / Chronic Care Management: CRR required this visit?  No   CCM required this visit?  No     Plan:     I have personally reviewed and noted the following in the patient's chart:   Medical and social history Use of alcohol, tobacco or illicit drugs  Current medications and supplements including opioid prescriptions. Patient is not currently taking opioid prescriptions. Functional ability and status Nutritional status Physical activity Advanced directives List of other physicians Hospitalizations, surgeries, and ER visits in previous 12 months Vitals Screenings to include cognitive, depression, and falls Referrals and appointments  In addition, I have reviewed and discussed with patient certain preventive protocols, quality metrics, and best practice  recommendations. A written personalized care plan for preventive services as well as general preventive health recommendations were provided to patient.     Mickeal Needy, LPN   [id number]   After Visit Summary: (MyChart) Due to this being a telephonic visit, the after visit summary with patients personalized plan was offered to patient via MyChart   Notes: Please refer to Routing Comments.

## 2023-10-19 ENCOUNTER — Telehealth: Payer: Self-pay | Admitting: Student

## 2023-10-19 NOTE — Telephone Encounter (Signed)
**  After Hours/ Emergency Line Call**  Received a page to call [phone]) - [phone].  Patient: Brianna Wheeler  Caller: Self  Confirmed name & DOB of patient with caller  Subjective:  Woke up feeling nauseous 10 AM. Vomited 1 time. No known sick contacts. Has not taken temp, but feels farm. No diarrhea. No coughing, abdominal pain, sore-throat, chest pain, SOB. As she's been more awake she is feeling better.   Objective:  Observations: NAD, speaking in full sentences   Assessment & Plan  ANTHEA UDOVICH is a 68 y.o. female with PMHx s/f HTN, asthma, GERD who calls with the following complaints and concerns: Nausea, 1 episode of emesis.  Reportedly feeling better since waking up and after vomiting.  No other systemic symptoms.  Differential is broad and includes viral gastritis as most likely cause.  Recommendations:  Parke Simmers diet Offered appointment for this week, patient declined Discussed ED precautions  -- Red flags discussed.   -- Will forward to PCP.  Tiffany Kocher, DO [facility] Res[facility], PGY-2

## 2023-11-05 DIAGNOSIS — W19XXXA Unspecified fall, initial encounter: Secondary | ICD-10-CM | POA: Diagnosis not present

## 2023-11-05 DIAGNOSIS — R509 Fever, unspecified: Secondary | ICD-10-CM | POA: Diagnosis not present

## 2023-11-06 ENCOUNTER — Inpatient Hospital Stay (HOSPITAL_COMMUNITY)

## 2023-11-06 ENCOUNTER — Other Ambulatory Visit: Payer: Self-pay

## 2023-11-06 ENCOUNTER — Inpatient Hospital Stay (HOSPITAL_COMMUNITY)
Admission: EM | Admit: 2023-11-06 | Discharge: 2023-11-25 | DRG: 871 | Disposition: A | Discharge status: Skilled Nursing Facility | Attending: Family Medicine | Admitting: Family Medicine

## 2023-11-06 ENCOUNTER — Emergency Department (HOSPITAL_COMMUNITY)

## 2023-11-06 ENCOUNTER — Encounter (HOSPITAL_COMMUNITY): Payer: Self-pay | Admitting: *Deleted

## 2023-11-06 DIAGNOSIS — K7460 Unspecified cirrhosis of liver: Secondary | ICD-10-CM | POA: Diagnosis present

## 2023-11-06 DIAGNOSIS — D721 Eosinophilia, unspecified: Secondary | ICD-10-CM | POA: Diagnosis not present

## 2023-11-06 DIAGNOSIS — M7989 Other specified soft tissue disorders: Secondary | ICD-10-CM

## 2023-11-06 DIAGNOSIS — R601 Generalized edema: Secondary | ICD-10-CM | POA: Diagnosis present

## 2023-11-06 DIAGNOSIS — K2970 Gastritis, unspecified, without bleeding: Secondary | ICD-10-CM | POA: Diagnosis not present

## 2023-11-06 DIAGNOSIS — E538 Deficiency of other specified B group vitamins: Secondary | ICD-10-CM | POA: Diagnosis present

## 2023-11-06 DIAGNOSIS — J4540 Moderate persistent asthma, uncomplicated: Secondary | ICD-10-CM | POA: Diagnosis present

## 2023-11-06 DIAGNOSIS — K5900 Constipation, unspecified: Secondary | ICD-10-CM | POA: Diagnosis not present

## 2023-11-06 DIAGNOSIS — K766 Portal hypertension: Secondary | ICD-10-CM | POA: Diagnosis not present

## 2023-11-06 DIAGNOSIS — D539 Nutritional anemia, unspecified: Secondary | ICD-10-CM | POA: Diagnosis not present

## 2023-11-06 DIAGNOSIS — E039 Hypothyroidism, unspecified: Secondary | ICD-10-CM | POA: Diagnosis not present

## 2023-11-06 DIAGNOSIS — H3320 Serous retinal detachment, unspecified eye: Secondary | ICD-10-CM | POA: Insufficient documentation

## 2023-11-06 DIAGNOSIS — R9389 Abnormal findings on diagnostic imaging of other specified body structures: Secondary | ICD-10-CM | POA: Diagnosis not present

## 2023-11-06 DIAGNOSIS — R6889 Other general symptoms and signs: Secondary | ICD-10-CM | POA: Diagnosis not present

## 2023-11-06 DIAGNOSIS — Z789 Other specified health status: Secondary | ICD-10-CM

## 2023-11-06 DIAGNOSIS — Z0389 Encounter for observation for other suspected diseases and conditions ruled out: Secondary | ICD-10-CM | POA: Diagnosis not present

## 2023-11-06 DIAGNOSIS — Z79899 Other long term (current) drug therapy: Secondary | ICD-10-CM

## 2023-11-06 DIAGNOSIS — I82403 Acute embolism and thrombosis of unspecified deep veins of lower extremity, bilateral: Secondary | ICD-10-CM | POA: Diagnosis not present

## 2023-11-06 DIAGNOSIS — N390 Urinary tract infection, site not specified: Secondary | ICD-10-CM | POA: Diagnosis present

## 2023-11-06 DIAGNOSIS — I700 Atherosclerosis of aorta: Secondary | ICD-10-CM | POA: Diagnosis not present

## 2023-11-06 DIAGNOSIS — R2681 Unsteadiness on feet: Secondary | ICD-10-CM | POA: Diagnosis not present

## 2023-11-06 DIAGNOSIS — G9341 Metabolic encephalopathy: Secondary | ICD-10-CM | POA: Diagnosis not present

## 2023-11-06 DIAGNOSIS — Z555 Less than a high school diploma: Secondary | ICD-10-CM

## 2023-11-06 DIAGNOSIS — I81 Portal vein thrombosis: Secondary | ICD-10-CM | POA: Diagnosis present

## 2023-11-06 DIAGNOSIS — I82890 Acute embolism and thrombosis of other specified veins: Secondary | ICD-10-CM | POA: Diagnosis not present

## 2023-11-06 DIAGNOSIS — Z6841 Body Mass Index (BMI) 40.0 and over, adult: Secondary | ICD-10-CM

## 2023-11-06 DIAGNOSIS — A419 Sepsis, unspecified organism: Secondary | ICD-10-CM | POA: Diagnosis not present

## 2023-11-06 DIAGNOSIS — Z886 Allergy status to analgesic agent status: Secondary | ICD-10-CM

## 2023-11-06 DIAGNOSIS — R569 Unspecified convulsions: Secondary | ICD-10-CM

## 2023-11-06 DIAGNOSIS — L89311 Pressure ulcer of right buttock, stage 1: Secondary | ICD-10-CM | POA: Diagnosis present

## 2023-11-06 DIAGNOSIS — L89301 Pressure ulcer of unspecified buttock, stage 1: Secondary | ICD-10-CM | POA: Diagnosis not present

## 2023-11-06 DIAGNOSIS — M1990 Unspecified osteoarthritis, unspecified site: Secondary | ICD-10-CM | POA: Diagnosis not present

## 2023-11-06 DIAGNOSIS — R011 Cardiac murmur, unspecified: Secondary | ICD-10-CM

## 2023-11-06 DIAGNOSIS — R4182 Altered mental status, unspecified: Secondary | ICD-10-CM | POA: Diagnosis not present

## 2023-11-06 DIAGNOSIS — L89321 Pressure ulcer of left buttock, stage 1: Secondary | ICD-10-CM | POA: Diagnosis present

## 2023-11-06 DIAGNOSIS — I10 Essential (primary) hypertension: Secondary | ICD-10-CM | POA: Diagnosis present

## 2023-11-06 DIAGNOSIS — K3189 Other diseases of stomach and duodenum: Secondary | ICD-10-CM | POA: Diagnosis not present

## 2023-11-06 DIAGNOSIS — R339 Retention of urine, unspecified: Secondary | ICD-10-CM

## 2023-11-06 DIAGNOSIS — R000 Tachycardia, unspecified: Secondary | ICD-10-CM | POA: Diagnosis not present

## 2023-11-06 DIAGNOSIS — W19XXXA Unspecified fall, initial encounter: Secondary | ICD-10-CM | POA: Diagnosis present

## 2023-11-06 DIAGNOSIS — Z888 Allergy status to other drugs, medicaments and biological substances status: Secondary | ICD-10-CM

## 2023-11-06 DIAGNOSIS — R6520 Severe sepsis without septic shock: Secondary | ICD-10-CM | POA: Diagnosis present

## 2023-11-06 DIAGNOSIS — Z13810 Encounter for screening for upper gastrointestinal disorder: Secondary | ICD-10-CM | POA: Diagnosis not present

## 2023-11-06 DIAGNOSIS — A4151 Sepsis due to Escherichia coli [E. coli]: Secondary | ICD-10-CM | POA: Diagnosis not present

## 2023-11-06 DIAGNOSIS — E872 Acidosis, unspecified: Secondary | ICD-10-CM | POA: Diagnosis not present

## 2023-11-06 DIAGNOSIS — E785 Hyperlipidemia, unspecified: Secondary | ICD-10-CM | POA: Diagnosis present

## 2023-11-06 DIAGNOSIS — H268 Other specified cataract: Secondary | ICD-10-CM | POA: Diagnosis not present

## 2023-11-06 DIAGNOSIS — N939 Abnormal uterine and vaginal bleeding, unspecified: Secondary | ICD-10-CM | POA: Diagnosis not present

## 2023-11-06 DIAGNOSIS — D7589 Other specified diseases of blood and blood-forming organs: Secondary | ICD-10-CM | POA: Diagnosis not present

## 2023-11-06 DIAGNOSIS — R17 Unspecified jaundice: Secondary | ICD-10-CM | POA: Diagnosis not present

## 2023-11-06 DIAGNOSIS — D696 Thrombocytopenia, unspecified: Secondary | ICD-10-CM

## 2023-11-06 DIAGNOSIS — K449 Diaphragmatic hernia without obstruction or gangrene: Secondary | ICD-10-CM | POA: Diagnosis not present

## 2023-11-06 DIAGNOSIS — E8809 Other disorders of plasma-protein metabolism, not elsewhere classified: Secondary | ICD-10-CM | POA: Diagnosis present

## 2023-11-06 DIAGNOSIS — K7682 Hepatic encephalopathy: Secondary | ICD-10-CM | POA: Diagnosis present

## 2023-11-06 DIAGNOSIS — D649 Anemia, unspecified: Secondary | ICD-10-CM | POA: Insufficient documentation

## 2023-11-06 DIAGNOSIS — Z833 Family history of diabetes mellitus: Secondary | ICD-10-CM

## 2023-11-06 DIAGNOSIS — G9340 Encephalopathy, unspecified: Secondary | ICD-10-CM | POA: Diagnosis not present

## 2023-11-06 DIAGNOSIS — R296 Repeated falls: Secondary | ICD-10-CM | POA: Diagnosis not present

## 2023-11-06 DIAGNOSIS — R1312 Dysphagia, oropharyngeal phase: Secondary | ICD-10-CM | POA: Diagnosis not present

## 2023-11-06 DIAGNOSIS — I872 Venous insufficiency (chronic) (peripheral): Secondary | ICD-10-CM | POA: Diagnosis not present

## 2023-11-06 DIAGNOSIS — K8020 Calculus of gallbladder without cholecystitis without obstruction: Secondary | ICD-10-CM | POA: Diagnosis not present

## 2023-11-06 DIAGNOSIS — D689 Coagulation defect, unspecified: Secondary | ICD-10-CM | POA: Diagnosis not present

## 2023-11-06 DIAGNOSIS — Z7401 Bed confinement status: Secondary | ICD-10-CM | POA: Diagnosis not present

## 2023-11-06 DIAGNOSIS — Z452 Encounter for adjustment and management of vascular access device: Secondary | ICD-10-CM | POA: Diagnosis not present

## 2023-11-06 DIAGNOSIS — C9000 Multiple myeloma not having achieved remission: Secondary | ICD-10-CM | POA: Diagnosis not present

## 2023-11-06 DIAGNOSIS — Z885 Allergy status to narcotic agent status: Secondary | ICD-10-CM

## 2023-11-06 DIAGNOSIS — D472 Monoclonal gammopathy: Secondary | ICD-10-CM | POA: Diagnosis not present

## 2023-11-06 DIAGNOSIS — Y92002 Bathroom of unspecified non-institutional (private) residence single-family (private) house as the place of occurrence of the external cause: Secondary | ICD-10-CM

## 2023-11-06 DIAGNOSIS — B9620 Unspecified Escherichia coli [E. coli] as the cause of diseases classified elsewhere: Secondary | ICD-10-CM | POA: Diagnosis present

## 2023-11-06 DIAGNOSIS — R932 Abnormal findings on diagnostic imaging of liver and biliary tract: Secondary | ICD-10-CM | POA: Diagnosis not present

## 2023-11-06 DIAGNOSIS — R0989 Other specified symptoms and signs involving the circulatory and respiratory systems: Secondary | ICD-10-CM | POA: Diagnosis not present

## 2023-11-06 DIAGNOSIS — E559 Vitamin D deficiency, unspecified: Secondary | ICD-10-CM | POA: Diagnosis present

## 2023-11-06 DIAGNOSIS — D61818 Other pancytopenia: Secondary | ICD-10-CM | POA: Diagnosis present

## 2023-11-06 DIAGNOSIS — K7581 Nonalcoholic steatohepatitis (NASH): Secondary | ICD-10-CM | POA: Diagnosis present

## 2023-11-06 DIAGNOSIS — D72822 Plasmacytosis: Secondary | ICD-10-CM | POA: Diagnosis not present

## 2023-11-06 DIAGNOSIS — R109 Unspecified abdominal pain: Secondary | ICD-10-CM | POA: Diagnosis not present

## 2023-11-06 DIAGNOSIS — N10 Acute tubulo-interstitial nephritis: Secondary | ICD-10-CM | POA: Diagnosis not present

## 2023-11-06 DIAGNOSIS — M6281 Muscle weakness (generalized): Secondary | ICD-10-CM | POA: Diagnosis not present

## 2023-11-06 DIAGNOSIS — I517 Cardiomegaly: Secondary | ICD-10-CM | POA: Diagnosis not present

## 2023-11-06 DIAGNOSIS — E119 Type 2 diabetes mellitus without complications: Secondary | ICD-10-CM | POA: Diagnosis present

## 2023-11-06 DIAGNOSIS — Z743 Need for continuous supervision: Secondary | ICD-10-CM | POA: Diagnosis not present

## 2023-11-06 DIAGNOSIS — Z882 Allergy status to sulfonamides status: Secondary | ICD-10-CM

## 2023-11-06 DIAGNOSIS — I8290 Acute embolism and thrombosis of unspecified vein: Secondary | ICD-10-CM | POA: Diagnosis present

## 2023-11-06 DIAGNOSIS — R7989 Other specified abnormal findings of blood chemistry: Secondary | ICD-10-CM | POA: Diagnosis not present

## 2023-11-06 DIAGNOSIS — R531 Weakness: Secondary | ICD-10-CM | POA: Diagnosis not present

## 2023-11-06 DIAGNOSIS — K219 Gastro-esophageal reflux disease without esophagitis: Secondary | ICD-10-CM | POA: Diagnosis not present

## 2023-11-06 DIAGNOSIS — J45909 Unspecified asthma, uncomplicated: Secondary | ICD-10-CM | POA: Diagnosis not present

## 2023-11-06 DIAGNOSIS — N950 Postmenopausal bleeding: Secondary | ICD-10-CM | POA: Diagnosis present

## 2023-11-06 LAB — CBC WITH DIFFERENTIAL/PLATELET
Abs Immature Granulocytes: 0.06 10*3/uL (ref 0.00–0.07)
Basophils Absolute: 0 10*3/uL (ref 0.0–0.1)
Basophils Relative: 0 %
Eosinophils Absolute: 1.5 10*3/uL — ABNORMAL HIGH (ref 0.0–0.5)
Eosinophils Relative: 13 %
HCT: 37.6 % (ref 36.0–46.0)
Hemoglobin: 12.1 g/dL (ref 12.0–15.0)
Immature Granulocytes: 1 %
Lymphocytes Relative: 5 %
Lymphs Abs: 0.6 10*3/uL — ABNORMAL LOW (ref 0.7–4.0)
MCH: 34.7 pg — ABNORMAL HIGH (ref 26.0–34.0)
MCHC: 32.2 g/dL (ref 30.0–36.0)
MCV: 107.7 fL — ABNORMAL HIGH (ref 80.0–100.0)
Monocytes Absolute: 0.4 10*3/uL (ref 0.1–1.0)
Monocytes Relative: 3 %
Neutro Abs: 8.5 10*3/uL — ABNORMAL HIGH (ref 1.7–7.7)
Neutrophils Relative %: 78 %
Platelets: 86 10*3/uL — ABNORMAL LOW (ref 150–400)
RBC: 3.49 MIL/uL — ABNORMAL LOW (ref 3.87–5.11)
RDW: 17.6 % — ABNORMAL HIGH (ref 11.5–15.5)
WBC: 11 10*3/uL — ABNORMAL HIGH (ref 4.0–10.5)
nRBC: 0 % (ref 0.0–0.2)

## 2023-11-06 LAB — BLOOD GAS, VENOUS
Acid-Base Excess: 1.3 mmol/L (ref 0.0–2.0)
Bicarbonate: 25 mmol/L (ref 20.0–28.0)
Drawn by: 72984
O2 Saturation: 82.6 %
Patient temperature: 37.7
pCO2, Ven: 37 mmHg — ABNORMAL LOW (ref 44–60)
pH, Ven: 7.44 — ABNORMAL HIGH (ref 7.25–7.43)
pO2, Ven: 51 mmHg — ABNORMAL HIGH (ref 32–45)

## 2023-11-06 LAB — APTT: aPTT: <22 s — ABNORMAL LOW (ref 24–36)

## 2023-11-06 LAB — RESP PANEL BY RT-PCR (RSV, FLU A&B, COVID)  RVPGX2
Influenza A by PCR: NEGATIVE
Influenza B by PCR: NEGATIVE
Resp Syncytial Virus by PCR: NEGATIVE
SARS Coronavirus 2 by RT PCR: NEGATIVE

## 2023-11-06 LAB — COMPREHENSIVE METABOLIC PANEL WITH GFR
ALT: 30 U/L (ref 0–44)
ALT: 27 U/L (ref 0–44)
AST: 72 U/L — ABNORMAL HIGH (ref 15–41)
AST: 71 U/L — ABNORMAL HIGH (ref 15–41)
Albumin: 2 g/dL — ABNORMAL LOW (ref 3.5–5.0)
Albumin: 2 g/dL — ABNORMAL LOW (ref 3.5–5.0)
Alkaline Phosphatase: 74 U/L (ref 38–126)
Alkaline Phosphatase: 65 U/L (ref 38–126)
Anion gap: 13 (ref 5–15)
Anion gap: 8 (ref 5–15)
BUN: 7 mg/dL — ABNORMAL LOW (ref 8–23)
BUN: 9 mg/dL (ref 8–23)
CO2: 19 mmol/L — ABNORMAL LOW (ref 22–32)
CO2: 20 mmol/L — ABNORMAL LOW (ref 22–32)
Calcium: 8.5 mg/dL — ABNORMAL LOW (ref 8.9–10.3)
Calcium: 8.2 mg/dL — ABNORMAL LOW (ref 8.9–10.3)
Chloride: 103 mmol/L (ref 98–111)
Chloride: 106 mmol/L (ref 98–111)
Creatinine, Ser: 1.03 mg/dL — ABNORMAL HIGH (ref 0.44–1.00)
Creatinine, Ser: 1.07 mg/dL — ABNORMAL HIGH (ref 0.44–1.00)
GFR, Estimated: 59 mL/min — ABNORMAL LOW (ref >60)
GFR, Estimated: 56 mL/min — ABNORMAL LOW (ref >60)
Glucose, Bld: 87 mg/dL (ref 70–99)
Glucose, Bld: 75 mg/dL (ref 70–99)
Potassium: 3.9 mmol/L (ref 3.5–5.1)
Potassium: 3.9 mmol/L (ref 3.5–5.1)
Sodium: 135 mmol/L (ref 135–145)
Sodium: 134 mmol/L — ABNORMAL LOW (ref 135–145)
Total Bilirubin: 6.1 mg/dL — ABNORMAL HIGH (ref 0.0–1.2)
Total Bilirubin: 6.8 mg/dL — ABNORMAL HIGH (ref 0.0–1.2)
Total Protein: 7 g/dL (ref 6.5–8.1)
Total Protein: 7 g/dL (ref 6.5–8.1)

## 2023-11-06 LAB — TSH: TSH: 21.392 u[IU]/mL — ABNORMAL HIGH (ref 0.350–4.500)

## 2023-11-06 LAB — T4, FREE: Free T4: 0.89 ng/dL (ref 0.61–1.12)

## 2023-11-06 LAB — LIPID PANEL
Cholesterol: 165 mg/dL (ref 0–200)
HDL: 13 mg/dL — ABNORMAL LOW (ref >40)
LDL Cholesterol: 138 mg/dL — ABNORMAL HIGH (ref 0–99)
Total CHOL/HDL Ratio: 12.7 ratio
Triglycerides: 68 mg/dL (ref <150)
VLDL: 14 mg/dL (ref 0–40)

## 2023-11-06 LAB — ECHOCARDIOGRAM COMPLETE
AR max vel: 3.04 cm2
AV Area VTI: 3.11 cm2
AV Area mean vel: 2.88 cm2
AV Mean grad: 9 mmHg
AV Peak grad: 16.6 mmHg
Ao pk vel: 2.04 m/s
Area-P 1/2: 4.21 cm2
Height: 71 in
S' Lateral: 2.8 cm
Weight: 4903.03 [oz_av]

## 2023-11-06 LAB — URINALYSIS, ROUTINE W REFLEX MICROSCOPIC
Glucose, UA: NEGATIVE mg/dL
Ketones, ur: NEGATIVE mg/dL
Leukocytes,Ua: NEGATIVE
Nitrite: POSITIVE — AB
Protein, ur: NEGATIVE mg/dL
Specific Gravity, Urine: 1.019 (ref 1.005–1.030)
pH: 5 (ref 5.0–8.0)

## 2023-11-06 LAB — GAMMA GT: GGT: 18 U/L (ref 7–50)

## 2023-11-06 LAB — CBG MONITORING, ED
Glucose-Capillary: 90 mg/dL (ref 70–99)
Glucose-Capillary: 83 mg/dL (ref 70–99)

## 2023-11-06 LAB — RETICULOCYTES
Immature Retic Fract: 21.1 % — ABNORMAL HIGH (ref 2.3–15.9)
RBC.: 3.57 MIL/uL — ABNORMAL LOW (ref 3.87–5.11)
Retic Count, Absolute: 95.3 10*3/uL (ref 19.0–186.0)
Retic Ct Pct: 2.7 % (ref 0.4–3.1)

## 2023-11-06 LAB — LACTIC ACID, PLASMA
Lactic Acid, Venous: 4.3 mmol/L (ref 0.5–1.9)
Lactic Acid, Venous: 3.8 mmol/L (ref 0.5–1.9)

## 2023-11-06 LAB — PROTIME-INR
INR: 2.1 — ABNORMAL HIGH (ref 0.8–1.2)
Prothrombin Time: 24 s — ABNORMAL HIGH (ref 11.4–15.2)

## 2023-11-06 LAB — FIBRINOGEN: Fibrinogen: 130 mg/dL — ABNORMAL LOW (ref 210–475)

## 2023-11-06 LAB — HEPATIC FUNCTION PANEL
ALT: 32 U/L (ref 0–44)
AST: 73 U/L — ABNORMAL HIGH (ref 15–41)
Albumin: 1.9 g/dL — ABNORMAL LOW (ref 3.5–5.0)
Alkaline Phosphatase: 72 U/L (ref 38–126)
Bilirubin, Direct: 2.3 mg/dL — ABNORMAL HIGH (ref 0.0–0.2)
Indirect Bilirubin: 4.2 mg/dL — ABNORMAL HIGH (ref 0.3–0.9)
Total Bilirubin: 6.5 mg/dL — ABNORMAL HIGH (ref 0.0–1.2)
Total Protein: 6.9 g/dL (ref 6.5–8.1)

## 2023-11-06 LAB — TECHNOLOGIST SMEAR REVIEW: Plt Morphology: NORMAL

## 2023-11-06 LAB — AMMONIA: Ammonia: 91 umol/L — ABNORMAL HIGH (ref 9–35)

## 2023-11-06 LAB — D-DIMER, QUANTITATIVE: D-Dimer, Quant: >20 ug{FEU}/mL — ABNORMAL HIGH (ref 0.00–0.50)

## 2023-11-06 LAB — CK: Total CK: 248 U/L — ABNORMAL HIGH (ref 38–234)

## 2023-11-06 MED ORDER — SODIUM CHLORIDE 0.9% FLUSH: 10.0000 mL | INTRAVENOUS | Status: DC | PRN

## 2023-11-06 MED ORDER — HEPARIN BOLUS VIA INFUSION
2000.0000 [IU] | Freq: Once | INTRAVENOUS | Status: AC
  Administered 2023-11-06: 2000 [IU] via INTRAVENOUS
  Filled 2023-11-06: qty 2000

## 2023-11-06 MED ORDER — LACTULOSE ENEMA
300.0000 mL | Freq: Two times a day (BID) | ORAL | Status: DC
  Administered 2023-11-06 (×2): 300 mL via RECTAL
  Filled 2023-11-06 (×3): qty 300

## 2023-11-06 MED ORDER — VANCOMYCIN HCL 2000 MG/400ML IV SOLN
2000.0000 mg | Freq: Once | INTRAVENOUS | Status: AC
  Administered 2023-11-06: 2000 mg via INTRAVENOUS
  Filled 2023-11-06: qty 400

## 2023-11-06 MED ORDER — METRONIDAZOLE 500 MG/100ML IV SOLN
500.0000 mg | Freq: Two times a day (BID) | INTRAVENOUS | Status: DC
  Administered 2023-11-06 – 2023-11-08 (×4): 500 mg via INTRAVENOUS
  Filled 2023-11-06 (×4): qty 100

## 2023-11-06 MED ORDER — METRONIDAZOLE 500 MG/100ML IV SOLN
500.0000 mg | Freq: Once | INTRAVENOUS | Status: AC
  Administered 2023-11-06: 500 mg via INTRAVENOUS
  Filled 2023-11-06: qty 100

## 2023-11-06 MED ORDER — HEPARIN (PORCINE) 25000 UT/250ML-% IV SOLN
1500.0000 [IU]/h | INTRAVENOUS | Status: DC
  Administered 2023-11-06: 1500 [IU]/h via INTRAVENOUS
  Filled 2023-11-06: qty 250

## 2023-11-06 MED ORDER — ACETAMINOPHEN 650 MG RE SUPP
975.0000 mg | Freq: Once | RECTAL | Status: AC
  Administered 2023-11-06: 975 mg via RECTAL
  Filled 2023-11-06: qty 1

## 2023-11-06 MED ORDER — LACTATED RINGERS IV SOLN
INTRAVENOUS | Status: DC
Start: 2023-11-06 — End: 2023-11-06

## 2023-11-06 MED ORDER — ACETAMINOPHEN 325 MG PO TABS
650.0000 mg | ORAL_TABLET | Freq: Four times a day (QID) | ORAL | Status: DC | PRN
  Administered 2023-11-07 – 2023-11-12 (×2): 650 mg via ORAL
  Filled 2023-11-06 (×2): qty 2

## 2023-11-06 MED ORDER — SODIUM CHLORIDE 0.9 % IV SOLN
2.0000 g | Freq: Two times a day (BID) | INTRAVENOUS | Status: DC
  Administered 2023-11-06 – 2023-11-07 (×3): 2 g via INTRAVENOUS
  Filled 2023-11-06 (×4): qty 20

## 2023-11-06 MED ORDER — SODIUM CHLORIDE 0.9 % IV SOLN
2.0000 g | INTRAVENOUS | Status: DC
  Administered 2023-11-06 – 2023-11-07 (×7): 2 g via INTRAVENOUS
  Filled 2023-11-06 (×9): qty 2000

## 2023-11-06 MED ORDER — LACTATED RINGERS IV BOLUS (SEPSIS)
1000.0000 mL | Freq: Once | INTRAVENOUS | Status: AC
  Administered 2023-11-06: 1000 mL via INTRAVENOUS

## 2023-11-06 MED ORDER — SODIUM CHLORIDE 0.9 % IV SOLN: INTRAVENOUS | Status: DC

## 2023-11-06 MED ORDER — IOHEXOL 350 MG/ML SOLN
75.0000 mL | Freq: Once | INTRAVENOUS | Status: AC | PRN
  Administered 2023-11-06: 75 mL via INTRAVENOUS

## 2023-11-06 MED ORDER — ACETAMINOPHEN 650 MG RE SUPP: 650.0000 mg | Freq: Four times a day (QID) | RECTAL | Status: DC | PRN

## 2023-11-06 MED ORDER — DEXTROSE 5 % IV SOLN
980.0000 mg | Freq: Three times a day (TID) | INTRAVENOUS | Status: DC
  Administered 2023-11-06 – 2023-11-07 (×4): 980 mg via INTRAVENOUS
  Filled 2023-11-06 (×6): qty 19.6

## 2023-11-06 MED ORDER — LACTATED RINGERS IV BOLUS
1000.0000 mL | Freq: Once | INTRAVENOUS | Status: AC
  Administered 2023-11-06: 1000 mL via INTRAVENOUS

## 2023-11-06 MED ORDER — VANCOMYCIN HCL IN DEXTROSE 1-5 GM/200ML-% IV SOLN
1000.0000 mg | Freq: Once | INTRAVENOUS | Status: DC
  Filled 2023-11-06: qty 200

## 2023-11-06 MED ORDER — CHLORHEXIDINE GLUCONATE CLOTH 2 % EX PADS
6.0000 | MEDICATED_PAD | Freq: Every day | CUTANEOUS | Status: DC
  Administered 2023-11-08 – 2023-11-25 (×16): 6 via TOPICAL

## 2023-11-06 MED ORDER — SODIUM CHLORIDE 0.9% FLUSH
10.0000 mL | Freq: Two times a day (BID) | INTRAVENOUS | Status: DC
Start: 2023-11-06 — End: 2023-11-25
  Administered 2023-11-06: 15 mL
  Administered 2023-11-07 – 2023-11-08 (×2): 10 mL
  Administered 2023-11-08: 30 mL
  Administered 2023-11-09: 10 mL
  Administered 2023-11-09: 30 mL
  Administered 2023-11-10 – 2023-11-25 (×28): 10 mL

## 2023-11-06 MED ORDER — SODIUM CHLORIDE 0.9 % IV SOLN
2.0000 g | Freq: Once | INTRAVENOUS | Status: AC
  Administered 2023-11-06: 2 g via INTRAVENOUS
  Filled 2023-11-06: qty 12.5

## 2023-11-06 MED ORDER — LACTULOSE ENEMA
300.0000 mL | Freq: Once | ORAL | Status: AC
  Administered 2023-11-06: 300 mL via RECTAL
  Filled 2023-11-06: qty 300

## 2023-11-06 MED ORDER — VANCOMYCIN HCL IN DEXTROSE 1-5 GM/200ML-% IV SOLN
1000.0000 mg | Freq: Two times a day (BID) | INTRAVENOUS | Status: DC
  Administered 2023-11-06 – 2023-11-07 (×3): 1000 mg via INTRAVENOUS
  Filled 2023-11-06 (×3): qty 200

## 2023-11-06 MED ORDER — DEXTROSE 5 % IV SOLN
940.0000 mg | Freq: Three times a day (TID) | INTRAVENOUS | Status: DC
  Filled 2023-11-06 (×2): qty 18.8

## 2023-11-06 NOTE — Assessment & Plan Note (Signed)
>>  ASSESSMENT AND PLAN FOR SWELLING OF BOTH LOWER EXTREMITIES WRITTEN ON 11/06/2023  1:46 PM BY Farris Hong, MD  - awaiting bilateral DVT US

## 2023-11-06 NOTE — Assessment & Plan Note (Signed)
 Holding meds in setting of AMS. Will restart when able. Holding all home meds in the setting of AMS - restart as appropriate. DMII: awaiting A1C  HTN HLD

## 2023-11-06 NOTE — H&P (Signed)
 Hospital Admission History and Physical Service Pager: [phone]  Patient name: Brianna Wheeler Medical record number: [id number] Date of Birth: [birth year] Age: 68 y.o. Gender: female  Primary Care Provider: Caro Laroche, DO Consultants: None Code Status: Full code, which was confirmed with family since patient unable to confirm  Preferred Emergency Contact: Does not have other family to involve Contact Information     Name Relation Home Work Mobile   Ground,[name] Son   [phone]      Other Contacts   None on File      Chief Complaint: AMS  Assessment and Plan: CAELYN ROUTE is a 68 y.o. female presenting with AMS. Differential for presentation of this includes stroke, seizure, encephalopathy of various etiologies.   - Metabolic encephalopathy 2/2 sepsis most likely cause given patient is febrile with leukocytosis, lactic acidosis, and apparent source of infection. Less likely metabolic encephalopathy related to hyperammonemia. - Seizure less likely without description of postictal state; stroke also less likely given no focal findings and timeline of symptom development.  Assessment & Plan Altered mental status Some question as to ultimate etiology, though suspect most likely related to sepsis 2/2 UTI as below. - Admit to FMTS, attending Dr. Miquel Dunn - Progressive, Vital signs per floor - LR mIVF 124mL/hr - Continue antibiotics (cefepime, flagyl, vanc) to be re-dosed as appropriate - AM CBC, CMP, TSH, RPR, Vit B12 - Fall precautions - SLP for swallow eval prior to ordering diet - PT/OT - consider MRI Brain for further workup of possible bleed Sepsis (HCC)  UTI UA with evidence of infection. Suspect this as source of sepsis. - continue broad spectrum antibiotic coverage as above. - follow up LA - follow up blood, urine cultures - acetaminophen 650 mg q6h PRN Thrombosis Found on CT - splenic and/or portal vein thrombosis. - Heparin per pharmacy - consider  consult to IR/VVS if indicated for intervention Swelling of both lower extremities New onset in the last week according to son. LLE is warmer and with greater swelling as compared to RLE. - obtain bilateral doppler to assess for DVT Jaundice Unclear onset, though could be secondary to thrombosis as above. - further workup with GGT, hepatic panel, PT-INR - RUQ/liver US Chronic health problem Holding all home meds in the setting of AMS - restart as appropriate. T2DM: Last A1c 4.9 in April 2024. Will recheck. HTN HLD Hypothyroidism  FEN/GI: NPO due to AMS VTE Prophylaxis: Heparin per pharmacy  Disposition: Progressive  History of Present Illness:  Brianna Wheeler is a 68 y.o. female presenting with altered mental status after being found down in her home by her son. He provides all of the history as below.  Son reports patient fell to her knees around lunch time and crawled to her bedroom. They were getting ready to go eat lunch and she was heading to her room to get ready when it happened. Completely normal prior to that time. Son was talking to her and she told him "I fell".   Son came out of his bedroom and found her. States she was not making sense. He tried to get her up but could not. She has been altered since that time. Reports she was having some leg pain after she fell. She was leaning up against the bed but having trouble moving otherwise. She typically ambulates with a walker and does all of her own ADLs. Called EMS at 10:30 to 11PM. States he waited that long because she does not  like him to call and involve EMS. Since arriving to the hospital he has been trying to talk to her but she has not responded.  Son denies she was sick with a fever, cough, congestion or shortness of breath. Denies vomiting and diarrhea. Taking all meds per usual.   In the ED, pt noted to be jaundiced, altered, febrile. Labs with lactic acidosis to 4.3, leukocytosis, mildly elevated ammonia and CK. UA  with evidence of infection. Respiratory panel negative. Imaging obtained; CXR and CT head unremarkable. CT C/A/P showed splenic and/or portal vein thrombosis. Patient received 1 L IVF bolus, broad spectrum antibiotics (cefepime, flagyl, vancomycin), lactulose, and was started on heparin per pharmacy.  Review Of Systems: Per HPI.  Pertinent Past Medical History: T2DM HTN HLD Hypothyroidism Moderate persistent asthma Osteoporosis? Remainder reviewed in history tab.   Pertinent Past Surgical History: Lumbar disc surgery Remainder reviewed in history tab.   Pertinent Social History: Tobacco use: Never Alcohol use: None Other Substance use: None Lives with son  Pertinent Family History: Sister: brain cancer Son: Epilepsy Remainder reviewed in history tab.   Important Outpatient Medications: *Took all meds yesterday per son Ventolin inhaler Gabapentin 100 mg 3 times daily HCTZ 12.5 mg daily Levothyroxine 125 mcg daily Omeprazole 20 mg daily Klor-Con 20 mEq daily Crestor 20 mg daily Tramadol 50 mg 3 times daily as needed Remainder reviewed in medication history.   Objective: BP (!) 109/40   Pulse (!) 114   Temp (!) 101.4 F (38.6 C)   Resp 16   Ht 5\' 8"  (1.727 m)   Wt (!) 139 kg   SpO2 100%   BMI 46.59 kg/m  Exam: General: Lying in bed, eyes closed, chronically ill-appearing. HEENT: normocephalic, PERRLA, MMM, bilateral TM visualized without erythema or bulging. Cardio: Regular rate, regular rhythm, no murmurs on exam. Pulm: Clear, no wheezing, no crackles. No increased work of breathing. Abdominal: soft, non-distended. Large body habitus inhibits exam. Extremities: Significant pitting LE edema bilaterally, L > R. Left LE also with warmth, no erythema. Does not move extremities. Skin: jaundiced Neuro: Not responsive to verbal stimuli or questions. Mumbles and sometimes shouts unintelligible phrases. Does cry out to sternal rub.    Labs:  CBC BMET  Recent Labs   Lab 11/06/23 0035  WBC 11.0*  HGB 12.1  HCT 37.6  PLT 86*   Recent Labs  Lab 11/06/23 0035  NA 135  K 3.9  CL 103  CO2 19*  BUN 7*  CREATININE 1.03*  GLUCOSE 87  CALCIUM 8.5*    Lactic acid 4.3 Ammonia 91 CK 248 Respiratory panel negative  EKG: Sinus tachycardia, no QTc prolongation or evidence of ST elevation.  4/16 CXR: IMPRESSION: No acute abnormality noted.  4/16 CT HEAD: IMPRESSION: 1. No acute intracranial abnormality is seen. 2. 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  4/16 CT C/A/P: IMPRESSION: 1. No acute intrathoracic pathology identified. 2. Moderate hiatal hernia. 3. Cholelithiasis. 4. Probable thrombosis of the splenic vein and/or portal vein with portosystemic collateralization to the left renal vein as described above. This is not well assessed on this noncontrast examination. This could be better assessed with contrast enhanced CT imaging or hepatic Doppler sh 5. Moderate diffuse subcutaneous body wall edema. 6. Remote T10, T11, and L3 compression deformities.  Cyndia Skeeters, DO 11/06/2023, 4:51 AM PGY-1, [facility]  FPTS Intern pager: [phone], text pages welcome Secure chat group [facility]  Hospital Teaching Service

## 2023-11-06 NOTE — Progress Notes (Signed)
 EEG complete. Results are pending.

## 2023-11-06 NOTE — Assessment & Plan Note (Signed)
-   awaiting bilateral DVT US

## 2023-11-06 NOTE — ED Notes (Signed)
PICC team at bs

## 2023-11-06 NOTE — Assessment & Plan Note (Addendum)
 Holding all home meds in the setting of AMS - restart as appropriate. T2DM: Last A1c 4.9 in April 2024. Will recheck. HTN HLD Hypothyroidism

## 2023-11-06 NOTE — TOC CM/SW Note (Signed)
 SW spoke with Brianna Wheeler from [facility] she stated the patient's case was screened and a Child psychotherapist has been assigned.   .Daleen Steinhaus, MSW, LCSWA Transition of Care  Clinical Social Worker (ED 3-11 Mon-Fri)  [phone]

## 2023-11-06 NOTE — Assessment & Plan Note (Signed)
>>  ASSESSMENT AND PLAN FOR SEPSIS (HCC)  UTI WRITTEN ON 11/06/2023  1:46 PM BY BALOCH, MAHNOOR, MD  - continue Abx as above - F/u Urine and Bcx - Tylenol  prn for fever

## 2023-11-06 NOTE — ED Provider Notes (Addendum)
 [place] [facility]ERGENCY DEPARTMENT AT [facility] Provider Note   CSN: [id number] Arrival date & time: 11/06/23  0014     History  Chief Complaint  Patient presents with   Altered Mental Status    Brianna Wheeler is a 68 y.o. female, history of diabetes, hyperlipidemia, hypertension, who presents to the ED secondary to acute altered mental status, with last known normal, around noon today.  History is limited given patient's altered status, but son states that she was doing well, until she dropped to her knees, around noon, and then called to the bedroom.  He states her legs have been swelling for long time, but denies noticing her yellow color.  He states that she is not on any blood thinners and has been compliant with her medications.  Does not drink alcohol or do drugs.  Home Medications Prior to Admission medications   Medication Sig Start Date End Date Taking? Authorizing Provider  albuterol (VENTOLIN HFA) 108 (90 Base) MCG/ACT inhaler Inhale 2 puffs into the lungs every 6 (six) hours as needed for wheezing or shortness of breath. 09/11/22   Rochester Chuck, MD  Calcium Carb-Cholecalciferol (CALCIUM 1000 + D PO) Take by mouth.    [provider]  diclofenac Sodium (VOLTAREN) 1 % GEL Apply 2 g topically 4 (four) times daily. 05/15/22   Anibal Kent, MD  fexofenadine (ALLEGRA) 180 MG tablet Take 1 tablet (180 mg total) by mouth daily. 09/11/22 12/10/22  Rochester Chuck, MD  fluticasone (FLONASE) 50 MCG/ACT nasal spray Place 2 sprays into both nostrils daily. Place 2 sprays in each nostril daily 09/11/22   Rochester Chuck, MD  gabapentin (NEURONTIN) 100 MG capsule Take 1 capsule (100 mg total) by mouth 3 (three) times daily. 06/28/22   Anibal Kent, MD  hydrochlorothiazide (HYDRODIURIL) 12.5 MG tablet Take 1 tablet (12.5 mg total) by mouth daily. 02/11/23   Rumball, Alison M, DO  levothyroxine (EUTHYROX) 125 MCG tablet TAKE 1 TABLET BY MOUTH ONCE  DAILY BEFORE BREAKFAST. NEED  TSH  CHECK  AND  OFFICE  VISIT  BEFORE  NEXT  REFILL. 09/13/23   Azell Boll, MD  Multiple Vitamin (MULTIVITAMIN) tablet Take 1 tablet by mouth daily.    [provider]  omeprazole (PRILOSEC) 20 MG capsule Take 1 capsule (20 mg total) by mouth daily. 04/10/23   Rumball, Alison M, DO  potassium chloride SA (KLOR-CON M20) 20 MEQ tablet Take 1 tablet (20 mEq total) by mouth daily. 06/17/23   Rumball, Alison M, DO  rosuvastatin (CRESTOR) 20 MG tablet Take 1 tablet by mouth once daily 01/31/23   Rumball, Alison M, DO  traMADol (ULTRAM) 50 MG tablet Take 1 tablet by mouth three times daily as needed 09/13/23   Azell Boll, MD      Allergies    Hydrocodone-acetaminophen, Nsaids, Oxycodone-acetaminophen, Tetracycline, and Bactrim [sulfamethoxazole-trimethoprim]    Review of Systems   Review of Systems  Constitutional:  Positive for fever.  Cardiovascular:  Positive for leg swelling.    Physical Exam Updated Vital Signs BP (!) 105/46   Pulse (!) 117   Temp (!) 101.7 F (38.7 C)   Resp 20   Ht 5\' 8"  (1.727 m)   Wt (!) 139 kg   SpO2 100%   BMI 46.59 kg/m  Physical Exam Vitals and nursing note reviewed.  Constitutional:      Appearance: She is well-developed.     Comments: Altered, yelling, not able to follow  commands, maintaining airway however.  Jaundiced appearing.  HENT:     Head: Normocephalic and atraumatic.  Eyes:     Conjunctiva/sclera: Conjunctivae normal.  Cardiovascular:     Rate and Rhythm: Normal rate and regular rhythm.     Heart sounds: No murmur heard. Pulmonary:     Effort: Pulmonary effort is normal. No respiratory distress.     Breath sounds: Normal breath sounds.  Abdominal:     Palpations: Abdomen is soft.     Tenderness: There is no abdominal tenderness.  Musculoskeletal:        General: No swelling.     Cervical back: Neck supple.     Right lower leg: 3+ Edema present.     Left lower leg: 3+ Edema present.   Skin:    General: Skin is warm and dry.     Capillary Refill: Capillary refill takes less than 2 seconds.  Psychiatric:     Comments: Agitated     ED Results / Procedures / Treatments   Labs (all labs ordered are listed, but only abnormal results are displayed) Labs Reviewed  CBC WITH DIFFERENTIAL/PLATELET - Abnormal; Notable for the following components:      Result Value   WBC 11.0 (*)    RBC 3.49 (*)    MCV 107.7 (*)    MCH 34.7 (*)    RDW 17.6 (*)    Platelets 86 (*)    Neutro Abs 8.5 (*)    Lymphs Abs 0.6 (*)    Eosinophils Absolute 1.5 (*)    All other components within normal limits  COMPREHENSIVE METABOLIC PANEL WITH GFR - Abnormal; Notable for the following components:   CO2 19 (*)    BUN 7 (*)    Creatinine, Ser 1.03 (*)    Calcium 8.5 (*)    Albumin 2.0 (*)    AST 72 (*)    Total Bilirubin 6.1 (*)    GFR, Estimated 59 (*)    All other components within normal limits  LACTIC ACID, PLASMA - Abnormal; Notable for the following components:   Lactic Acid, Venous 4.3 (*)    All other components within normal limits  AMMONIA - Abnormal; Notable for the following components:   Ammonia 91 (*)    All other components within normal limits  URINALYSIS, ROUTINE W REFLEX MICROSCOPIC - Abnormal; Notable for the following components:   Color, Urine AMBER (*)    APPearance HAZY (*)    Hgb urine dipstick [place] (*)    Bilirubin Urine Ihor Meinzer (*)    Nitrite POSITIVE (*)    Bacteria, UA MANY (*)    All other components within normal limits  CK - Abnormal; Notable for the following components:   Total CK 248 (*)    All other components within normal limits  CULTURE, BLOOD (ROUTINE X 2)  CULTURE, BLOOD (ROUTINE X 2)  RESP PANEL BY RT-PCR (RSV, FLU A&B, COVID)  RVPGX2  LACTIC ACID, PLASMA  HEPARIN LEVEL (UNFRACTIONATED)  CBC    EKG None  Radiology CT CHEST ABDOMEN PELVIS W CONTRAST Result Date: 11/06/2023 CLINICAL DATA:  Altered mental status, unspecified abdominal  pain, hyperbilirubinemia EXAM: CT CHEST, ABDOMEN, AND PELVIS WITH CONTRAST TECHNIQUE: Multidetector CT imaging of the chest, abdomen and pelvis was performed following the standard protocol during bolus administration of intravenous contrast. RADIATION DOSE REDUCTION: This exam was performed according to the departmental dose-optimization program which includes automated exposure control, adjustment of the mA and/or kV according to patient size and/or  use of iterative reconstruction technique. CONTRAST:  75mL OMNIPAQUE IOHEXOL 350 MG/ML SOLN COMPARISON:  None Available. FINDINGS: CT CHEST FINDINGS Cardiovascular: No significant coronary artery calcification. Global cardiac size within normal limits. No pericardial effusion. Central pulmonary arteries are of normal caliber. Mild atherosclerotic calcification within the thoracic aorta. No aortic aneurysm. Mediastinum/Nodes: No enlarged mediastinal, hilar, or axillary lymph nodes. Thyroid gland, trachea, and esophagus demonstrate no significant findings. Moderate hiatal hernia Lungs/Pleura: Lungs are clear. No pleural effusion or pneumothorax. Musculoskeletal: Remote appearing compression deformities T10 and T11 are noted with loss of height. No acute bone abnormality. No lytic blastic bone lesion. CT ABDOMEN PELVIS FINDINGS Hepatobiliary: Cholelithiasis without superimposed pericholecystic inflammatory change. Liver unremarkable on this noncontrast examination. No intra or extrahepatic biliary ductal dilation. Pancreas: Unremarkable Spleen: Unremarkable Adrenals/Urinary Tract: The adrenal glands are unremarkable. Immediately adjacent to the left adrenal gland is a nodular density abutting the gastric fundus and aorta likely representing a indirect splenorenal shunt and suggestive of occlusion the splenic and/or portal vein. This is not well assessed on this noncontrast examination the kidneys unremarkable. The bladder is decompressed with a Foley catheter balloon seen  within its lumen. Stomach/Bowel: Varicoid densities seen within the left periaortic region likely representing mesenteric collateralization to the left renal vein and suggestive portal vein thrombosis. The stomach, Davinci Glotfelty bowel, and large bowel are otherwise. Appendix. No free intraperitoneal gas or fluid. Vascular/Lymphatic: Moderate aortoiliac atherosclerotic calcification. Varicoid densities is noted above suggesting probable thrombosis of the splenic vein and/or portal vein with portosystemic collateralization to the left renal vein. No pathologic adenopathy within the abdomen and pelvis. Reproductive: Uterus and bilateral adnexa are unremarkable. Other: Moderate diffuse subcutaneous body wall edema Musculoskeletal: Osseous structures are age-appropriate. Remote L3 compression deformity. No acute bone abnormality. IMPRESSION: 1. No acute intrathoracic pathology identified. 2. Moderate hiatal hernia. 3. Cholelithiasis. 4. Probable thrombosis of the splenic vein and/or portal vein with portosystemic collateralization to the left renal vein as described above. This is not well assessed on this noncontrast examination. This could be better assessed with contrast enhanced CT imaging or hepatic Doppler sh 5. Moderate diffuse subcutaneous body wall edema. 6. Remote T10, T11, and L3 compression deformities. Electronically Signed   By: Helyn Numbers M.D.   On: 11/06/2023 02:37   CT Head Wo Contrast Result Date: 11/06/2023 CLINICAL DATA:  Altered mental status EXAM: CT HEAD WITHOUT CONTRAST TECHNIQUE: Contiguous axial images were obtained from the base of the skull through the vertex without intravenous contrast. RADIATION DOSE REDUCTION: This exam was performed according to the departmental dose-optimization program which includes automated exposure control, adjustment of the mA and/or kV according to patient size and/or use of iterative reconstruction technique. COMPARISON:  None Available. FINDINGS: Brain: No  evidence of acute infarction, hemorrhage, hydrocephalus, extra-axial collection or mass lesion/mass effect. Vascular: No hyperdense vessel or unexpected calcification. Skull: Normal. Negative for fracture or focal lesion. Sinuses/Orbits: Right scleral banding has been performed. The left ocular globe demonstrates retinal detachment and calcification of the ocular lens in keeping with a chronic degenerative process. The paranasal sinuses are clear. Other: Mastoid air cells and middle ear cavities are clear. IMPRESSION: 1. No acute intracranial abnormality is seen. 2. 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 Electronically Signed   By: Helyn Numbers M.D.   On: 11/06/2023 02:27   DG Chest Port 1 View Result Date: 11/06/2023 CLINICAL DATA:  Possible sepsis EXAM: PORTABLE CHEST 1 VIEW COMPARISON:  08/29/2013  FINDINGS: Cardiac shadow is mildly prominent but accentuated by the portable technique. Aortic calcifications are noted. Lungs are well aerated bilaterally. Patient rotation to the left accentuates the mediastinal markings. No focal infiltrate is seen. IMPRESSION: No acute abnormality noted. Electronically Signed   By: Alcide Clever M.D.   On: 11/06/2023 01:41    Procedures Procedures    Medications Ordered in ED Medications  lactated ringers infusion ( Intravenous New Bag/Given 11/06/23 0059)  lactulose (CHRONULAC) enema 200 gm (has no administration in time range)  heparin ADULT infusion 100 units/mL (25000 units/254mL) (1,500 Units/hr Intravenous New Bag/Given 11/06/23 0321)  lactated ringers bolus 1,000 mL (0 mLs Intravenous Stopped 11/06/23 0211)  ceFEPIme (MAXIPIME) 2 g in sodium chloride 0.9 % 100 mL IVPB (0 g Intravenous Stopped 11/06/23 0247)  metroNIDAZOLE (FLAGYL) IVPB 500 mg (0 mg Intravenous Stopped 11/06/23 0207)  vancomycin (VANCOREADY) IVPB 2000 mg/400 mL (0 mg Intravenous Stopped 11/06/23 0303)   acetaminophen (TYLENOL) suppository 975 mg (975 mg Rectal Given 11/06/23 0209)  iohexol (OMNIPAQUE) 350 MG/ML injection 75 mL (75 mLs Intravenous Contrast Given 11/06/23 0207)  heparin bolus via infusion 2,000 Units (2,000 Units Intravenous Bolus from Bag 11/06/23 0321)    ED Course/ Medical Decision Making/ A&P                                 Medical Decision Making Patient is a 69 year old female, who is acutely altered on my exam.  She is jaundiced, and aggressive with staff.  She is ill-appearing.  Tachycardic.  Sepsis criteria, initiated and activated.  Will treat for broad-spectrum sepsis, as I have limited history, will also obtain a head CT, chest and belly CT, as she may have fallen, this is unclear as she was found on the ground.  CK ordered, to evaluate for possible rhabdo.  Will only give 1 L at this time as she appears fluid overloaded on my exam.  Amount and/or Complexity of Data Reviewed Labs: ordered.    Details: Leukocytosis of 11.0, nitrate positive UTI, ammonia of 91 Radiology: ordered.    Details: CT head unremarkable, CTA of abdomen, shows findings concerning for splenic and portal vein thrombosis.  Remote thoracic spinal compression fractures noted. Discussion of management or test interpretation with external provider(s): I discussed with the staff son, she he states that she has a history of spinal compression fractures, it is unclear whether this is new or old, she is acutely altered, so exam is limited, but she is able to move all of her extremities.  She is extremely altered, likely secondary to metabolic encephalopathy, secondary to hyperammonemia, as well as a UTI.  We are treating her with broad-spectrum antibiotics, and then also I ordered some lactulose.  She has a splenic and portal vein thrombosis, which is likely causing this hyperbilirubinemia and elevated ammonia.  Will start her on heparin at this time secondary to that.  Gave her Tylenol for fever.  Admitted to  family medicine for further management.  Risk Prescription drug management. Decision regarding hospitalization.   CRITICAL CARE Performed by: Pete Pelt   Total critical care time: 60 minutes  Critical care time was exclusive of separately billable procedures and treating other patients.  Critical care was necessary to treat or prevent imminent or life-threatening deterioration.  Critical care was time spent personally by me on the following activities: development of treatment plan with patient and/or surrogate as well as nursing, discussions  with consultants, evaluation of patient's response to treatment, examination of patient, obtaining history from patient or surrogate, ordering and performing treatments and interventions, ordering and review of laboratory studies, ordering and review of radiographic studies, pulse oximetry and re-evaluation of patient's condition.  Final Clinical Impression(s) / ED Diagnoses Final diagnoses:  Sepsis secondary to UTI [facility])  Splenic vein thrombosis  Portal vein thrombosis    Rx / DC Orders ED Discharge Orders     None         Kashira Behunin, Dwaine Gip, PA 11/06/23 0351    Timmy Forbes, PA 11/06/23 0351    Edson Graces, MD 11/06/23 [phone]

## 2023-11-06 NOTE — Assessment & Plan Note (Signed)
 New onset in the last week according to son. LLE is warmer and with greater swelling as compared to RLE. - obtain bilateral doppler to assess for DVT

## 2023-11-06 NOTE — Progress Notes (Signed)
 Pharmacy Antibiotic Note  Brianna Wheeler is a 68 y.o. female admitted on 11/06/2023 with sepsis and possible bacterial vs viral meningitis. Pending CSF cx, pharmacy has been consulted for acyclovir and vancomycin dosing; also on ampicillin and ceftriaxone.   In the ED, received: Cefepime 2g x1 4/16 Metronidazole 500mg  x1 4/16  Vancomycin 2000mg  x1 4/16   Patient is febrile upon presentation with Tmax at 102.1 F; now afebrile (T 99.3 F). Pulse ranging in the upper limit 94-105. BP soft ranging from 103/38 - 130/62. Renal function at baseline (Scr 1.07; baseline likely 0.90-1.00). WBC elevated at 11.0. Platelets low at 86. Lactic acid elevated but trending down 4.3 >> 3.8.   Plan: - Initiate acyclovir 980mg  IV q8h (~10 mg/kg q8h; ABW) - Initiate vancomycin 1000 mg IV q12h (Trough goal 15-20 mcg/mL) - Monitor renal function, clinical progress, cultures/sensitivities - F/U LOT and de-escalate as able - Vancomycin levels as clinically indicated   Height: 5\' 11"  (180.3 cm) (per previous hx) Weight: (!) 139 kg (306 lb 7 oz) IBW/kg (Calculated) : 70.8  Temp (24hrs), Avg:100.9 F (38.3 C), Min:99.3 F (37.4 C), Max:102.1 F (38.9 C)  Recent Labs  Lab 11/06/23 0035 11/06/23 0442  WBC 11.0*  --   CREATININE 1.03* 1.07*  LATICACIDVEN 4.3* 3.8*    Estimated Creatinine Clearance: 76.8 mL/min (A) (by C-G formula based on SCr of 1.07 mg/dL (H)).    Allergies  Allergen Reactions   Hydrocodone-Acetaminophen     REACTION: Vomited and had toruble breathing in 2009   Nsaids     Makes sick on her stomach   Oxycodone-Acetaminophen Other (See Comments)    Pt hallucinates with this med   Tetracycline     REACTION: nausea   Bactrim [Sulfamethoxazole-Trimethoprim] Rash    Antimicrobials this admission: Cefepime 2g x1 4/16 Metronidazole 500mg  x1 4/16   Acyclovir 980mg  q8h >> Ampicillin 2000mg  q4h >> Ceftriaxone 2g q12h >> Vancomycin 4/16 >>   Dose adjustments this  admission:   Microbiology results: 4/16 BCx: no growth  within 12 hours 4/16 Respiratory Panel: negative for COVID, influenza, and RSV 4/16 UCx: pending  4/16 CSF Cx: pending  Thank you for allowing pharmacy to be a part of this patient's care.  Dean Every Matisse Roskelley 11/06/2023 12:36 PM

## 2023-11-06 NOTE — Plan of Care (Addendum)
 Discussed case with Dr. Maria Shiner heme-onc.  He states that her lab results are likely due to acute infection and clinically not DIC without acute bleeding.  He would not anticoagulate splenic vein thrombosis.  Reports that he will see the patient today or tomorrow.   ADDENDUM 4:58pm. Reevaluated patient this p.m.  Son present at bedside.    Maps on blood pressure monitor persistently 55-60.  Telemetry sinus rhythm.  Short run of V. tach less than 10 beats noted. Patient responsive to voice and touch, but unable to form intelligible speech.  Opens eyes to voice.  Murmur present, regular rhythm.  Patient jaundiced.  Continue unclear cause of sepsis, UTI versus meningitis versus intra-abdominal cause.  Continue IV antibiotics.  Will dose additional 1 L LR  bolus for improvement of MAP.  If no response CCM consult for need for pressor support.

## 2023-11-06 NOTE — Assessment & Plan Note (Signed)
>>  ASSESSMENT AND PLAN FOR SWELLING OF BOTH LOWER EXTREMITIES WRITTEN ON 11/06/2023  4:51 AM BY Omar Bibber, DO  New onset in the last week according to son. LLE is warmer and with greater swelling as compared to RLE. - obtain bilateral doppler to assess for DVT

## 2023-11-06 NOTE — Assessment & Plan Note (Signed)
>>  ASSESSMENT AND PLAN FOR PORTAL VENOUS THROMBUS WRITTEN ON 11/06/2023  1:46 PM BY Farris Hong, MD  - continue heparin  per pharmacy - awaiting RUQ US  with doppler

## 2023-11-06 NOTE — Procedures (Signed)
 Patient Name: ASHANNA HEINSOHN  MRN: [id number]  Epilepsy Attending: Arleene Lack  Referring Physician/Provider: Dema Filler, MD  Date: 11/06/2023 Duration: 23.04 mins  Patient history: 68yo F with ams. EEG to evaluate for seizure  Level of alertness: Awake/ lethargic   AEDs during EEG study: None  Technical aspects: This EEG study was done with scalp electrodes positioned according to the 10-20 International system of electrode placement. Electrical activity was reviewed with band pass filter of 1-70Hz , sensitivity of 7 uV/mm, display speed of 32mm/sec with a 60Hz  notched filter applied as appropriate. EEG data were recorded continuously and digitally stored.  Video monitoring was available and reviewed as appropriate.  Description: EEG showed continuous generalized 3 to 5 Hz theta-delta slowing. Generalized periodic discharges with triphasic morphology at 1.5 -2 Hz were also noted. Hyperventilation and photic stimulation were not performed.     ABNORMALITY - Periodic discharges with triphasic morphology, generalized ( GPDs) - Continuous slow, generalized  IMPRESSION: This study is suggestive of moderate to severe diffuse encephalopathy likely related to toxic-metabolic causes. No seizures or definite epileptiform discharges were seen throughout the recording.  Suhaas Agena O Zyhir Cappella

## 2023-11-06 NOTE — Assessment & Plan Note (Signed)
 Patient with obvious jaundice, elevated direct and indirect bilirubin, decreased platelet count, and elevated INR. Unclear etiology. Will order hemolysis labs and await RUQ ultrasound. - Fibrinogen, haptoglobin, LDH, peripheral smear pending - F/u  RUQ US 

## 2023-11-06 NOTE — Assessment & Plan Note (Signed)
 UA with evidence of infection. Suspect this as source of sepsis. - continue broad spectrum antibiotic coverage as above. - follow up LA - follow up blood, urine cultures - acetaminophen 650 mg q6h PRN

## 2023-11-06 NOTE — Assessment & Plan Note (Signed)
 Systolic murmur heard on exam. Will order echo. - Echo pending

## 2023-11-06 NOTE — Plan of Care (Signed)
 FMTS Interim Progress Note  S: To bedside for night rounds. Pt awake, son at bedside. She responds to voice but overall incoherent. Somewhat redirectable.   O: BP 105/63   Pulse 91   Temp 99.7 F (37.6 C)   Resp 18   Ht 5\' 11"  (1.803 m) Comment: per previous hx  Wt (!) 139 kg   SpO2 99%   BMI 42.74 kg/m    RRR CTAB anteriorly Significant lower extremity edema b/l Jaundiced Awake, alert, does not follow commands but intermittently responds to yes/no questions. Does not respond appropriately to orientation questions but overheard referring to herself as "Brianna Wheeler".  Attempting to pull off mittens  Hypotension AMS/encephalopathy  Sepsis -Maintaining MAP > 65 for now. Continue fluids -NS bolus if MAP drops, if no response likely involve CCM -NPO -LP tomorrow per IR -Abx and remainder of workup per today's progress note    Edison Gore, MD 11/06/2023, 8:11 PM PGY-2, [facility] Family Medicine Service pager [phone]

## 2023-11-06 NOTE — Assessment & Plan Note (Signed)
-   continue heparin per pharmacy - awaiting RUQ US  with doppler

## 2023-11-06 NOTE — Sepsis Progress Note (Addendum)
 Elink monitoring for the code sepsis protocol.   0300: Notified bedside nurse of need to draw 2nd lactic acid.

## 2023-11-06 NOTE — Progress Notes (Signed)
  IR BRIEF NOTE:  Patient is admitted for altered mental status. A lumbar puncture was ordered by her care team for meningitis evaluation. Patient is unable to consent for herself, nor can next of kin. After discussion with her care team, consensus was achieved for emergency consent due to urgency of patient's condition. IR has reviewed all labs, and is in agreement with emergency consent.   Electronically Signed: Lovena Rubinstein, PA-C 11/06/2023, 11:03 AM

## 2023-11-06 NOTE — Plan of Care (Addendum)
 FMTS Interim Progress Note  Vitals:   11/06/23 1800 11/06/23 1900  BP: (!) 92/47 105/63  Pulse: 85 91  Resp: (!) 21 18  Temp: 99.8 F (37.7 C) 99.7 F (37.6 C)  SpO2: 100% 99%   CV: Normal S1/S2. No extra heart sounds. Warm and well-perfused. Intact radial and DP pulses bilaterally.  Pulm: Breathing comfortably on room air. CTAB anteriorly. No increased WOB. Neuro: A&O x0. Sleepy, responsive to voice, intermittently shouts words or phrases. Not interactive. Unable to follow commands.   A/P: Hypotension, AMS Improving BP and MAP to 73 following recent fluid bolus.  - If MAPs decline again, consider redosing another fluid bolus - If MAPs remain low after repeated bolus, plan to consult CCM  - Cont sepsis workup and IV abx  Continue plan as otherwise indicated by FMTS treatment team.   Carey Chapman, MD 11/06/2023, 7:05 PM PGY-1, [facility] Medicine Service pager [phone]

## 2023-11-06 NOTE — Assessment & Plan Note (Signed)
 TSH elevated. Will order T4 to follow up - T4 pending - will restart home meds when able to PO

## 2023-11-06 NOTE — Plan of Care (Addendum)
 FMTS ATTENDING ADMISSION NOTE Mitul Hallowell,MD I  reviewed their chart. I have discussed this patient with the resident and discussed with the attending/Dr. Drue Gerald who evaluated this patient today. I agree with the resident's findings, assessment and care plan.  Lumbar puncture needed to R/O Meningitis  The patient is unable to provide consent for lumbar puncture due to altered mental status, and her son is also unable to provide consent at this time. Given the significant medical benefit in ruling out meningitis and the urgency of the situation, I approve proceeding with the lumbar puncture under two-physician consent provided IR is comfortable with her platelet level which is low.  In addition DIC panel obtained for thrombocytopenia, seems positive Resident messaged to reach out to hematology as soon as possible Monitor closely for bleeding May need to d/c heparin although needed for splenic/portal vein thrombosis - decision pending hematology consultation  Note - D dimer is elevated. However, no O2 requirement. Consider CTA chest to r/o PE if she becomes hypoxic.  See Dr. Roise Cleaver detailed H&P from today for additional information. Low threshold for ICU transfer. Signed off to Dr. McDiarmid to follow

## 2023-11-06 NOTE — Assessment & Plan Note (Signed)
 Unclear onset, though could be secondary to thrombosis as above. - further workup with GGT, hepatic panel, PT-INR - RUQ/liver US 

## 2023-11-06 NOTE — Progress Notes (Addendum)
 1:55pm: CSW made additional call to DSS - CSW spoke with Loreda Rodriguez to make APS report.  1:05pm: CSW made additional attempts to reach DSS staff for an APS report without success.  12pm: CSW received consult due to patient being present with her disabled son [name], with no other family available to assist in his care.  Per chart review, patient is not oriented and unable to consent for treatment for herself and is requiring admission for medical issues.  CSW spoke with Renay Carota, RN who states patient's son [name] is at bedside and that he does not want to go home and desires to stay with patient at the hospital. RN states [name] is appropriate and has not been problematic in any way.  CSW spoke with Hillary Lowing, [facility] to notify her of situation.   CSW spoke with Irena Manners, ED Director to notify her of situation.  CSW attempted to reach staff at DSS to make an APS report without success - a voicemail was left requesting a return call so that a report can be made.  Shepard Dicker, MSW, LCSW Transitions of Care  Clinical Social Worker II [phone]

## 2023-11-06 NOTE — Progress Notes (Signed)
 Peripherally Inserted Central Catheter Placement  The IV Nurse has discussed with the patient and/or persons authorized to consent for the patient, the purpose of this procedure and the potential benefits and risks involved with this procedure.  The benefits include less needle sticks, lab draws from the catheter, and the patient may be discharged home with the catheter. Risks include, but not limited to, infection, bleeding, blood clot (thrombus formation), and puncture of an artery; nerve damage and irregular heartbeat and possibility to perform a PICC exchange if needed/ordered by physician.  Alternatives to this procedure were also discussed.  Bard Power PICC patient education guide, fact sheet on infection prevention and patient information card has been provided to patient /or left at bedside. Medical necessity obtained for consent.   PICC Placement Documentation  PICC Triple Lumen 11/06/23 Right Brachial 40 cm (Active)  Indication for Insertion or Continuance of Line Limited venous access - need for IV therapy >5 days (PICC only) 11/06/23 1703  Site Assessment Clean, Dry, Intact 11/06/23 1703  Lumen #1 Status Flushed;Saline locked;Blood return noted 11/06/23 1703  Lumen #2 Status Flushed;Saline locked;Blood return noted 11/06/23 1703  Lumen #3 Status Flushed;Saline locked;Blood return noted 11/06/23 1703  Dressing Type Transparent;Securing device 11/06/23 1703  Dressing Status Antimicrobial disc/dressing in place 11/06/23 1703  Line Care Connections checked and tightened 11/06/23 1703  Line Adjustment (NICU/IV Team Only) No 11/06/23 1703  Dressing Intervention New dressing 11/06/23 1703  Dressing Change Due 11/13/23 11/06/23 1703       Rice Chamorro  Gwyn Mehring 11/06/2023, 5:04 PM

## 2023-11-06 NOTE — Assessment & Plan Note (Signed)
 Still do not have a clear cause for encephalopathy, differential remains wide.  May be multifactorial from sepsis secondary to urinary source, meningitis, hepatic encephalopathy.  - Continue meningitic dosing of antibiotics with vancomycin, ceftriaxone, and ampicillin. - IR to attempt LP today, will follow up labs - Will order EEG for concern of possible seizures - RUQ US  with doppler pending - Lactulose BID  - Urine culture pending - Blood culture NGTD - AP CBC, CMP - will order VBG  - will decrease fluids to 50cc/hr for renal protection from antibiotics given patient has anasarca - Fall precautions - Seizure precaution

## 2023-11-06 NOTE — Plan of Care (Signed)
 FMTS Interim Progress Note  S: patient moans to voice, does not open eyes.   O: BP (!) 106/46   Pulse (!) 112   Temp 100.2 F (37.9 C)   Resp 17   Ht 5\' 8"  (1.727 m)   Wt (!) 139 kg   SpO2 100%   BMI 46.59 kg/m   General: eyes closed, moaning to voice, does not open eyes, jaundiced  HEENT: No sign of trauma Cardiac: distant heart sounds, tachycardic, systolic murmur  Respiratory: CTAB GI: obese, TTP at RUQ.  Extremities: ansarca throughout. Left LE warm to touch. Bilateral extremities swollen  Neuro: not alert or oriented, unable to follow commands. Mild tremor noted at bilateral upper extremities, questionable asterixis  Psych: Appropriate mood and affect   A/P: Patient is a 68 year old female with past medical history of HTN, hypothyroidism, hyperlipidemia, osteoarthritis, GERD admitted for acute encephalopathy.  Patient continues to be encephalopathic. Assessment & Plan Acute metabolic encephalopathy Still do not have a clear cause for encephalopathy, differential remains wide.  May be multifactorial from sepsis secondary to urinary source, meningitis, hepatic encephalopathy.  - Continue meningitic dosing of antibiotics with vancomycin, ceftriaxone, and ampicillin. - IR to attempt LP today, will follow up labs - Will order EEG for concern of possible seizures - RUQ US  with doppler pending - Lactulose BID  - Urine culture pending - Blood culture NGTD - AP CBC, CMP - will order VBG  - will decrease fluids to 50cc/hr for renal protection from antibiotics given patient has anasarca - Fall precautions - Seizure precaution Sepsis (HCC)  UTI - continue Abx as above - F/u Urine and Bcx - Tylenol prn for fever  Thrombosis - continue heparin per pharmacy - awaiting RUQ US  with doppler  Swelling of both lower extremities - awaiting bilateral DVT US   Jaundice Patient with obvious jaundice, elevated direct and indirect bilirubin, decreased platelet count, and elevated INR.  Unclear etiology. Will order hemolysis labs and await RUQ ultrasound. - Fibrinogen, haptoglobin, LDH, peripheral smear pending - F/u  RUQ US   Hypothyroidism TSH elevated. Will order T4 to follow up - T4 pending - will restart home meds when able to PO  Systolic murmur Systolic murmur heard on exam. Will order echo. - Echo pending Chronic health problem Holding meds in setting of AMS. Will restart when able. Holding all home meds in the setting of AMS - restart as appropriate. DMII: awaiting A1C  HTN HLD    Derril Flint, Haydn Cush, MD 11/06/2023, 7:00 AM PGY-1, [facility] pager [phone]

## 2023-11-06 NOTE — ED Triage Notes (Signed)
 Pt from home with EMS for AMS. EMS called for lift assist. On scene, pt found lying on left side on the floor. Son reported pt crawled herself across the floor to the bathroom. No wounds noted to legs. Pt altered currently. Following some commands; jaundice noted. EMS placed 22g R hand. EMS VS 124/74, temp 100.7, SPo2 96%CBG 115

## 2023-11-06 NOTE — Assessment & Plan Note (Signed)
-   continue Abx as above - F/u Urine and Bcx - Tylenol prn for fever

## 2023-11-06 NOTE — ED Notes (Signed)
 Unable to obtain heparin level. Pt has limited access. Multiple teams I've tried without success. PICC line ordered placed this morning. No word on when procedure will be.

## 2023-11-06 NOTE — Progress Notes (Signed)
 SLP Cancellation Note  Patient Details Name: Brianna Wheeler MRN: [id number] DOB: 12-Nov-1955   Cancelled treatment:       Reason Eval/Treat Not Completed: Other (comment) Per RN, patient only alert to pain at this time. SLP will follow for readiness.  Jacqualine Mater, MA, CCC-SLP Speech Therapy

## 2023-11-06 NOTE — Assessment & Plan Note (Signed)
>>  ASSESSMENT AND PLAN FOR PORTAL VENOUS THROMBUS WRITTEN ON 11/06/2023  4:51 AM BY SPENCE, SARAH, DO  Found on CT - splenic and/or portal vein thrombosis. - Heparin  per pharmacy - consider consult to IR/VVS if indicated for intervention

## 2023-11-06 NOTE — Assessment & Plan Note (Signed)
>>  ASSESSMENT AND PLAN FOR SEPSIS (HCC)  UTI WRITTEN ON 11/06/2023  4:51 AM BY SPENCE, SARAH, DO  UA with evidence of infection. Suspect this as source of sepsis. - continue broad spectrum antibiotic coverage as above. - follow up LA - follow up blood, urine cultures - acetaminophen  650 mg q6h PRN

## 2023-11-06 NOTE — Assessment & Plan Note (Signed)
 Found on CT - splenic and/or portal vein thrombosis. - Heparin per pharmacy - consider consult to IR/VVS if indicated for intervention

## 2023-11-06 NOTE — Assessment & Plan Note (Signed)
 Some question as to ultimate etiology, though suspect most likely related to sepsis 2/2 UTI as below. - Admit to FMTS, attending Dr. Drue Gerald - Progressive, Vital signs per floor - LR mIVF 150mL/hr - Continue antibiotics (cefepime, flagyl, vanc) to be re-dosed as appropriate - AM CBC, CMP, TSH, RPR, Vit B12 - Fall precautions - SLP for swallow eval prior to ordering diet - PT/OT - consider MRI Brain for further workup of possible bleed

## 2023-11-06 NOTE — Progress Notes (Signed)
  IR BRIEF NOTE:  Multiple attempts were made today to transport patient from ED to Radiology suite. Unfortunately, transport attempts were canceled as patient was experiencing agitation (pulling lines out), hypotension, or was otherwise unavailable for transport. Will re-attempt tomorrow.   Electronically Signed: Lovena Rubinstein, PA-C 11/06/2023, 5:15 PM

## 2023-11-06 NOTE — Progress Notes (Signed)
 PHARMACY - ANTICOAGULATION CONSULT NOTE  Pharmacy Consult for heparin Indication: splenic/portal vein thrombosis   Patient Measurements: Height: 5\' 8"  (172.7 cm) Weight: (!) 139 kg (306 lb 7 oz) IBW/kg (Calculated) : 63.9 HEPARIN DW (KG): 97.6  Vital Signs: Temp: 101.2 F (38.4 C) (04/16 0115) Temp Source: Bladder (04/16 0115) BP: 119/87 (04/16 0115) Pulse Rate: 114 (04/16 0115)  Labs: Recent Labs    11/06/23 0035  HGB 12.1  HCT 37.6  PLT 86*  CREATININE 1.03*  CKTOTAL 248*    Estimated Creatinine Clearance: 76.4 mL/min (A) (by C-G formula based on SCr of 1.03 mg/dL (H)).   Medical History: Past Medical History:  Diagnosis Date   Diabetes mellitus    Hyperlipidemia    Hypertension    Left foot pain 2005   Chronic injury -- multiple broken bones and torn ligaments after accident in garage    Medications:  -No PTA AC  Assessment: 80 yoF presented to ED with AMS. Cta shows splenic vein and portal vein thrombosis. Pharmacy consulted to dose heparin  -Hgb 12, plts 86  Goal of Therapy:  Heparin level 0.3-0.7 units/ml Monitor platelets by anticoagulation protocol: Yes   Plan:  Heparin bolus 2000 units IV x1 given low plts count  Start heparin infusion at 1500 units/hr Heparin level in 6 hours CBC daily  Young Hensen, PharmD. Clinical Pharmacist 11/06/2023 3:01 AM

## 2023-11-06 NOTE — Progress Notes (Signed)
 PT Cancellation Note  Patient Details Name: KADEE PHILYAW MRN: [id number] DOB: 04-Jun-1956   Cancelled Treatment:    Reason Eval/Treat Not Completed: Patient not medically ready; note pt awaiting doppler to r/o DVT and with multiple metabolic derangements.  Will follow up when stable.    Marley Simmers 11/06/2023, 9:24 AM Abigail Hoff, PT Acute Rehabilitation Services [phone] 11/06/2023

## 2023-11-07 ENCOUNTER — Inpatient Hospital Stay (HOSPITAL_COMMUNITY)

## 2023-11-07 DIAGNOSIS — D649 Anemia, unspecified: Secondary | ICD-10-CM | POA: Diagnosis not present

## 2023-11-07 DIAGNOSIS — D696 Thrombocytopenia, unspecified: Secondary | ICD-10-CM

## 2023-11-07 DIAGNOSIS — K766 Portal hypertension: Secondary | ICD-10-CM

## 2023-11-07 DIAGNOSIS — I81 Portal vein thrombosis: Secondary | ICD-10-CM

## 2023-11-07 DIAGNOSIS — A4151 Sepsis due to Escherichia coli [E. coli]: Secondary | ICD-10-CM

## 2023-11-07 DIAGNOSIS — A419 Sepsis, unspecified organism: Secondary | ICD-10-CM | POA: Diagnosis not present

## 2023-11-07 DIAGNOSIS — D7589 Other specified diseases of blood and blood-forming organs: Secondary | ICD-10-CM

## 2023-11-07 DIAGNOSIS — D689 Coagulation defect, unspecified: Secondary | ICD-10-CM

## 2023-11-07 DIAGNOSIS — R601 Generalized edema: Secondary | ICD-10-CM

## 2023-11-07 DIAGNOSIS — R17 Unspecified jaundice: Secondary | ICD-10-CM

## 2023-11-07 DIAGNOSIS — R932 Abnormal findings on diagnostic imaging of liver and biliary tract: Secondary | ICD-10-CM

## 2023-11-07 DIAGNOSIS — R4182 Altered mental status, unspecified: Secondary | ICD-10-CM | POA: Diagnosis not present

## 2023-11-07 DIAGNOSIS — G9340 Encephalopathy, unspecified: Secondary | ICD-10-CM

## 2023-11-07 DIAGNOSIS — N10 Acute tubulo-interstitial nephritis: Secondary | ICD-10-CM | POA: Diagnosis not present

## 2023-11-07 DIAGNOSIS — K7460 Unspecified cirrhosis of liver: Secondary | ICD-10-CM

## 2023-11-07 DIAGNOSIS — L89301 Pressure ulcer of unspecified buttock, stage 1: Secondary | ICD-10-CM | POA: Diagnosis present

## 2023-11-07 LAB — CBC
HCT: 28.3 % — ABNORMAL LOW (ref 36.0–46.0)
HCT: 28.5 % — ABNORMAL LOW (ref 36.0–46.0)
Hemoglobin: 9.6 g/dL — ABNORMAL LOW (ref 12.0–15.0)
Hemoglobin: 9.5 g/dL — ABNORMAL LOW (ref 12.0–15.0)
MCH: 34.8 pg — ABNORMAL HIGH (ref 26.0–34.0)
MCH: 34.1 pg — ABNORMAL HIGH (ref 26.0–34.0)
MCHC: 33.9 g/dL (ref 30.0–36.0)
MCHC: 33.3 g/dL (ref 30.0–36.0)
MCV: 102.5 fL — ABNORMAL HIGH (ref 80.0–100.0)
MCV: 102.2 fL — ABNORMAL HIGH (ref 80.0–100.0)
Platelets: 55 10*3/uL — ABNORMAL LOW (ref 150–400)
Platelets: 61 10*3/uL — ABNORMAL LOW (ref 150–400)
RBC: 2.76 MIL/uL — ABNORMAL LOW (ref 3.87–5.11)
RBC: 2.79 MIL/uL — ABNORMAL LOW (ref 3.87–5.11)
RDW: 17.7 % — ABNORMAL HIGH (ref 11.5–15.5)
RDW: 17.4 % — ABNORMAL HIGH (ref 11.5–15.5)
WBC: 8.9 10*3/uL (ref 4.0–10.5)
WBC: 7.8 10*3/uL (ref 4.0–10.5)
nRBC: 0 % (ref 0.0–0.2)
nRBC: 0 % (ref 0.0–0.2)

## 2023-11-07 LAB — BLOOD CULTURE ID PANEL (REFLEXED) - BCID2

## 2023-11-07 LAB — MENINGITIS/ENCEPHALITIS PANEL (CSF)

## 2023-11-07 LAB — FIBRINOGEN: Fibrinogen: 152 mg/dL — ABNORMAL LOW (ref 210–475)

## 2023-11-07 LAB — COMPREHENSIVE METABOLIC PANEL WITH GFR
ALT: 33 U/L (ref 0–44)
AST: 130 U/L — ABNORMAL HIGH (ref 15–41)
Albumin: 1.5 g/dL — ABNORMAL LOW (ref 3.5–5.0)
Alkaline Phosphatase: 52 U/L (ref 38–126)
Anion gap: 3 — ABNORMAL LOW (ref 5–15)
BUN: 12 mg/dL (ref 8–23)
CO2: 24 mmol/L (ref 22–32)
Calcium: 7.7 mg/dL — ABNORMAL LOW (ref 8.9–10.3)
Chloride: 108 mmol/L (ref 98–111)
Creatinine, Ser: 0.95 mg/dL (ref 0.44–1.00)
GFR, Estimated: >60 mL/min (ref >60)
Glucose, Bld: 107 mg/dL — ABNORMAL HIGH (ref 70–99)
Potassium: 3.4 mmol/L — ABNORMAL LOW (ref 3.5–5.1)
Sodium: 135 mmol/L (ref 135–145)
Total Bilirubin: 4 mg/dL — ABNORMAL HIGH (ref 0.0–1.2)
Total Protein: 5.5 g/dL — ABNORMAL LOW (ref 6.5–8.1)

## 2023-11-07 LAB — RETICULOCYTES
Immature Retic Fract: 20 % — ABNORMAL HIGH (ref 2.3–15.9)
RBC.: 2.74 MIL/uL — ABNORMAL LOW (ref 3.87–5.11)
Retic Count, Absolute: 75.1 10*3/uL (ref 19.0–186.0)
Retic Ct Pct: 2.7 % (ref 0.4–3.1)

## 2023-11-07 LAB — HAPTOGLOBIN: Haptoglobin: 66 mg/dL (ref 37–355)

## 2023-11-07 LAB — IRON AND TIBC
Iron: 143 ug/dL (ref 28–170)
Saturation Ratios: 90 % — ABNORMAL HIGH (ref 10.4–31.8)
TIBC: 158 ug/dL — ABNORMAL LOW (ref 250–450)
UIBC: 15 ug/dL

## 2023-11-07 LAB — CULTURE, BLOOD (ROUTINE X 2): Special Requests: ADEQUATE

## 2023-11-07 LAB — MISC LABCORP TEST (SEND OUT): Labcorp test code: 83935

## 2023-11-07 LAB — HEPATITIS PANEL, ACUTE
HCV Ab: NONREACTIVE
Hep A IgM: NONREACTIVE
Hep B C IgM: NONREACTIVE
Hepatitis B Surface Ag: NONREACTIVE

## 2023-11-07 LAB — FERRITIN: Ferritin: 194 ng/mL (ref 11–307)

## 2023-11-07 LAB — VITAMIN B12: Vitamin B-12: 596 pg/mL (ref 180–914)

## 2023-11-07 LAB — LACTATE DEHYDROGENASE: LDH: 195 U/L — ABNORMAL HIGH (ref 98–192)

## 2023-11-07 LAB — MAGNESIUM: Magnesium: 1.4 mg/dL — ABNORMAL LOW (ref 1.7–2.4)

## 2023-11-07 LAB — GLUCOSE, CAPILLARY
Glucose-Capillary: 75 mg/dL (ref 70–99)
Glucose-Capillary: 92 mg/dL (ref 70–99)

## 2023-11-07 LAB — FOLATE: Folate: 3 ng/mL — ABNORMAL LOW (ref >5.9)

## 2023-11-07 LAB — CBG MONITORING, ED: Glucose-Capillary: 79 mg/dL (ref 70–99)

## 2023-11-07 LAB — RPR: RPR Ser Ql: NONREACTIVE

## 2023-11-07 LAB — MRSA NEXT GEN BY PCR, NASAL: MRSA by PCR Next Gen: NOT DETECTED

## 2023-11-07 MED ORDER — FOLIC ACID 1 MG PO TABS
1.0000 mg | ORAL_TABLET | Freq: Every day | ORAL | Status: DC
  Administered 2023-11-07 – 2023-11-08 (×2): 1 mg via ORAL
  Filled 2023-11-07 (×2): qty 1

## 2023-11-07 MED ORDER — LEVOTHYROXINE SODIUM 25 MCG PO TABS
125.0000 ug | ORAL_TABLET | Freq: Every day | ORAL | Status: DC
  Administered 2023-11-07 – 2023-11-25 (×19): 125 ug via ORAL
  Filled 2023-11-07 (×20): qty 1

## 2023-11-07 MED ORDER — ROSUVASTATIN CALCIUM 20 MG PO TABS: 20.0000 mg | ORAL_TABLET | Freq: Every day | ORAL | Status: DC

## 2023-11-07 MED ORDER — PHYTONADIONE 5 MG PO TABS
10.0000 mg | ORAL_TABLET | Freq: Every day | ORAL | Status: AC
  Administered 2023-11-07 – 2023-11-09 (×3): 10 mg via ORAL
  Filled 2023-11-07 (×3): qty 2

## 2023-11-07 MED ORDER — LACTULOSE 10 GM/15ML PO SOLN
20.0000 g | Freq: Three times a day (TID) | ORAL | Status: DC
  Administered 2023-11-07 – 2023-11-08 (×3): 20 g via ORAL
  Filled 2023-11-07 (×3): qty 30

## 2023-11-07 MED ORDER — POTASSIUM CHLORIDE 10 MEQ/100ML IV SOLN
10.0000 meq | INTRAVENOUS | Status: AC
  Administered 2023-11-07 (×2): 10 meq via INTRAVENOUS
  Filled 2023-11-07 (×2): qty 100

## 2023-11-07 MED ORDER — SODIUM CHLORIDE 0.9 % IV SOLN: INTRAVENOUS | Status: DC

## 2023-11-07 MED ORDER — MAGNESIUM SULFATE 2 GM/50ML IV SOLN
2.0000 g | Freq: Once | INTRAVENOUS | Status: AC
  Administered 2023-11-07: 2 g via INTRAVENOUS
  Filled 2023-11-07: qty 50

## 2023-11-07 MED ORDER — SODIUM CHLORIDE 0.9 % IV SOLN
2.0000 g | INTRAVENOUS | Status: AC
  Administered 2023-11-08 – 2023-11-09 (×2): 2 g via INTRAVENOUS
  Filled 2023-11-07 (×3): qty 20

## 2023-11-07 NOTE — Assessment & Plan Note (Addendum)
 Patient appears much more alert today. EEG concerning for diffuse encephalopathy likely related to toxic-metabolic cause. Blood culture positive for staph epidermis, likely contaminant. Urine culture with gram negative rods, awaiting speciation. VBG largely wnl.  - GI consulted, appreciate recs  - continue meningitic dosing of Abx (ctx, vanc, amp) and acyclovir, consider de-escalating today vs after LP performed. Added on flagyl for enteric coverage.  - will increase IVF to 100 ml/hr  - IR to attempt LP again today - Pending RUQ US  read - continue lactulose BID, will transition to PO given patient is much more alert  - urine culture pending - AM CBC, CMP - will add on diet as patient is much more alert - continue fall and seizure precautions

## 2023-11-07 NOTE — Assessment & Plan Note (Signed)
>>  ASSESSMENT AND PLAN FOR PORTAL VENOUS THROMBUS WRITTEN ON 11/07/2023  1:18 PM BY BALOCH, MAHNOOR, MD  - holding DVT ppx in setting of possible DIC? Though will consider anticoagulation today based on GI recs and Heme/Onc

## 2023-11-07 NOTE — Evaluation (Signed)
 Occupational Therapy Evaluation Patient Details Name: Brianna Wheeler MRN: [id number] DOB: [birth year] Today's Date: 11/07/2023   History of Present Illness   Pt is a 68 y/o female presenting after a fall at home and AMS. Pt admitted for acute encephalopathy, sepsis and BLE/abdominal edema. LP 4/17. PMH: HTN, hypothyroidism, prediabetes, hyperlipidemia, osteoarthritis, GERD, asthma     Clinical Impressions PTA, pt lives with her son, Judie Grieve, reports being ambulatory with a walker and managing most ADLs but home setup/PLOF not clear on eval today. Pt presents now with deficits in cognition, balance, strength, endurance and edema. Pt requiring +2 assist for bed mobility, standing from bedside with RW and stepping to recliner. Pt requiring Mod-Total A x 2 for ADLs at this time. Pt unable to successfully stand from lower recliner surface and will need maxisky lift for transfer from lower surfaces. Pt with decreased safety awareness, attention and memory deficits noted during session. Pt's son present during session, unable to provide the extensive physical assist that pt currently requires. Patient will benefit from continued inpatient follow up therapy, <3 hours/day at DC.     If plan is discharge home, recommend the following:   A lot of help with walking and/or transfers;Two people to help with walking and/or transfers;A lot of help with bathing/dressing/bathroom;Two people to help with bathing/dressing/bathroom     Functional Status Assessment   Patient has had a recent decline in their functional status and demonstrates the ability to make significant improvements in function in a reasonable and predictable amount of time.     Equipment Recommendations   [employer];Hospital bed     Recommendations for Other Services         Precautions/Restrictions   Precautions Precautions: Fall Recall of Precautions/Restrictions: Impaired Precaution/Restrictions Comments: L eye visual  deficits Restrictions Weight Bearing Restrictions Per Provider Order: No     Mobility Bed Mobility Overal bed mobility: Needs Assistance Bed Mobility: Supine to Sit     Supine to sit: Mod assist, +2 for physical assistance, +2 for safety/equipment, [name] elevated, Used rails     General bed mobility comments: assist/cues for BLE to EOB. able to assist lifting trunk. bed pad used to assist in scooting pt to EOB    Transfers Overall transfer level: Needs assistance Equipment used: Rolling walker (2 wheels) Transfers: Sit to/from Stand, Bed to chair/wheelchair/BSC Sit to Stand: Mod assist, +2 physical assistance, +2 safety/equipment, From elevated surface, Max assist     Step pivot transfers: Min assist, +2 physical assistance, +2 safety/equipment     General transfer comment: pt able to stand from EOB on second trial (slightly elevated). cues for hand placement, heavy truncal flexion noted with pt then able to push shoulders upright in standing. able to take steps to recliner fairly well but increased cues for directions to L side (pt later reporting L eye visual deficits). Attempted to stand from recliner multiple times with pt able to partially clear bottom but unable to fully get upright. with each trial, able to slide maxisky lift pad under LE      Balance Overall balance assessment: Needs assistance Sitting-balance support: No upper extremity supported, Feet supported Sitting balance-[name]: Fair     Standing balance support: Bilateral upper extremity supported, During functional activity Standing balance-[name]: Poor                             ADL either performed or assessed with clinical judgement  ADL Overall ADL's : Needs assistance/impaired Eating/Feeding: Set up   Grooming: Set up;Sitting   Upper Body Bathing: Moderate assistance;Sitting   Lower Body Bathing: Maximal assistance;+2 for physical assistance;+2 for  safety/equipment;Sitting/lateral leans;Sit to/from stand;Bed level   Upper Body Dressing : Moderate assistance;Sitting   Lower Body Dressing: Maximal assistance;+2 for physical assistance;+2 for safety/equipment;Sit to/from stand;Sitting/lateral leans;Bed level       Toileting- Clothing Manipulation and Hygiene: Total assistance;Bed level               Vision Baseline Vision/History: 2 Legally blind Ability to See in Adequate Light: 2 Moderately impaired Patient Visual Report: No change from baseline Vision Assessment?: Vision impaired- to be further tested in functional context Additional Comments: L eye impaired from prior retinal detachment     Perception         Praxis         Pertinent Vitals/Pain Pain Assessment Pain Assessment: No/denies pain     Extremity/Trunk Assessment Upper Extremity Assessment Upper Extremity Assessment: Generalized weakness;Right hand dominant   Lower Extremity Assessment Lower Extremity Assessment: Defer to PT evaluation   Cervical / Trunk Assessment Cervical / Trunk Assessment: Other exceptions Cervical / Trunk Exceptions: increased body habitus   Communication Communication Communication: No apparent difficulties   Cognition Arousal: Alert Behavior During Therapy: WFL for tasks assessed/performed, Impulsive Cognition: Cognition impaired   Orientation impairments: Situation, Time Awareness: Intellectual awareness impaired, Online awareness impaired Memory impairment (select all impairments): Short-term memory, Declarative long-term memory, Working memory Attention impairment (select first level of impairment): Sustained attention Executive functioning impairment (select all impairments): Sequencing, Problem solving, Reasoning, Organization OT - Cognition Comments: pleasant, eager to mobilize. poor short term memory, poor safety awareness with cues for problem solving and sequencing needed. potentially at or near baseline                  Following commands: Impaired Following commands impaired: Follows one step commands with increased time, Follows multi-step commands inconsistently     Cueing  General Comments   Cueing Techniques: Verbal cues;Gestural cues;Tactile cues;Visual cues  Son present. Footrest malfunctioning and would not stay up at end of session. Located armchair to place under to elevate B feet.   Exercises     Shoulder Instructions      Home Living Family/patient expects to be discharged to:: Private residence Living Arrangements: Children Available Help at Discharge: Family;Available 24 hours/day Type of Home: House       Home Layout: One level     Bathroom Shower/Tub: Tub/shower unit         Home Equipment: Agricultural consultant (2 wheels);Rollator (4 wheels);Wheelchair - manual   Additional Comments: difficulty gathering home setup info from pt/son due to inconsistent setup reports. lives with son who has his own health issues      Prior Functioning/Environment Prior Level of Function : Patient poor historian/Family not available;Needs assist;History of Falls (last six months)             Mobility Comments: reports using walker for mobility ADLs Comments: reports able to manage ADLs but difficult. son assisting with IADLs though pt drives    OT Problem List: Decreased strength;Decreased activity tolerance;Impaired balance (sitting and/or standing);Decreased cognition;Decreased safety awareness;Decreased knowledge of use of DME or AE;Obesity   OT Treatment/Interventions: Self-care/ADL training;Therapeutic exercise;Energy conservation;DME and/or AE instruction;Therapeutic activities;Patient/family education;Balance training      OT Goals(Current goals can be found in the care plan section)   Acute Rehab OT Goals Patient Stated Goal:  prevent future falls OT Goal Formulation: With patient/family Time For Goal Achievement: 11/21/23 Potential to Achieve Goals:  Good ADL Goals Pt Will Perform Lower Body Bathing: with mod assist;sit to/from stand;with adaptive equipment;sitting/lateral leans Pt Will Transfer to Toilet: with mod assist;with +2 assist;stand pivot transfer;bedside commode Pt/caregiver will Perform Home Exercise Program: Increased strength;Both right and left upper extremity;With theraband;With written HEP provided Additional ADL Goal #1: Pt to complete bed mobility with Min A in prep for EOB ADLs   OT Frequency:  Min 2X/week    Co-evaluation PT/OT/SLP Co-Evaluation/Treatment: Yes Reason for Co-Treatment: For patient/therapist safety;To address functional/ADL transfers;Necessary to address cognition/behavior during functional activity   OT goals addressed during session: ADL's and self-care;Proper use of Adaptive equipment and DME;Strengthening/ROM      AM-PAC OT "6 Clicks" Daily Activity     Outcome Measure Help from another person eating meals?: A Little Help from another person taking care of personal grooming?: A Little Help from another person toileting, which includes using toliet, bedpan, or urinal?: Total Help from another person bathing (including washing, rinsing, drying)?: A Lot Help from another person to put on and taking off regular upper body clothing?: A Little Help from another person to put on and taking off regular lower body clothing?: Total 6 Click Score: 13   End of Session Equipment Utilized During Treatment: Rolling walker (2 wheels) Nurse Communication: Mobility status;Need for lift equipment  Activity Tolerance: Patient tolerated treatment well Patient left: in chair;with call bell/phone within reach;with chair alarm set  OT Visit Diagnosis: Unsteadiness on feet (R26.81);Other abnormalities of gait and mobility (R26.89);Muscle weakness (generalized) (M62.81);History of falling (Z91.81);Other symptoms and signs involving cognitive function                Time: 1478-2956 OT Time Calculation (min): 48  min Charges:  OT General Charges $OT Visit: 1 Visit OT Evaluation $OT Eval Moderate Complexity: 1 Mod OT Treatments $Therapeutic Activity: 8-22 mins  Lawrence Pretty, OTR/L Acute Rehab Services Office: [phone]   Annabella Barr 11/07/2023, 2:51 PM

## 2023-11-07 NOTE — Assessment & Plan Note (Signed)
>>  ASSESSMENT AND PLAN FOR SWELLING OF BOTH LOWER EXTREMITIES WRITTEN ON 11/07/2023  8:50 AM BY BALOCH, MAHNOOR, MD  Bilateral DVT US  negative. Swelling likely ISO of anasarca d/t hepatic dysfunction. Aaron Aas

## 2023-11-07 NOTE — Evaluation (Signed)
 Physical Therapy Evaluation Patient Details Name: Brianna Wheeler MRN: [id number] DOB: May 08, 1956 Today's Date: 11/07/2023  History of Present Illness  Pt is a 68 y/o female presenting after a fall at home and AMS. Pt admitted for acute encephalopathy, sepsis and BLE/abdominal edema. LP 4/17. PMH: HTN, hypothyroidism, prediabetes, hyperlipidemia, osteoarthritis, GERD, asthma  Clinical Impression  PTA pt and son were living together in a single story home with ramped entrance though Son and patient give conflicting home set up report. Pt reports she was walking with a Rollator and independent with ADLs and iADLs, caregiver of her adult son. Pt is currently limited in safe mobility by AMS, especially command follow, increased body habitus with additional edema, producing reduced ROM in presence of generalized weakness and decreased balance. Pt is modAx2 for bed mobility, modAx2 for transfers from elevated surface, maxAx2 for attempt at transfer to standing from lower recliner surface, pt unable to attain upright and require maxAx2 for scooting back in there recliner. Patient will benefit from continued inpatient follow up therapy, <3 hours/day but will be made difficult as far as son is concerned. PT will continue to follow acutely.        If plan is discharge home, recommend the following: Two people to help with walking and/or transfers;Two people to help with bathing/dressing/bathroom;Assistance with cooking/housework;Assistance with feeding;Direct supervision/assist for medications management;Direct supervision/assist for financial management;Assist for transportation;Help with stairs or ramp for entrance;Supervision due to cognitive status   Can travel by private vehicle   No    Equipment Recommendations Hospital bed     Functional Status Assessment Patient has had a recent decline in their functional status and demonstrates the ability to make significant improvements in function in a  reasonable and predictable amount of time.     Precautions / Restrictions Precautions Precautions: Fall Recall of Precautions/Restrictions: Impaired Precaution/Restrictions Comments: L eye visual deficits Restrictions Weight Bearing Restrictions Per Provider Order: No      Mobility  Bed Mobility Overal bed mobility: Needs Assistance Bed Mobility: Supine to Sit     Supine to sit: Mod assist, +2 for physical assistance, +2 for safety/equipment, [name] elevated, Used rails     General bed mobility comments: assist/cues for BLE to EOB. able to assist lifting trunk. bed pad used to assist in scooting pt to EOB    Transfers Overall transfer level: Needs assistance Equipment used: Rolling walker (2 wheels) Transfers: Sit to/from Stand, Bed to chair/wheelchair/BSC Sit to Stand: Mod assist, +2 physical assistance, +2 safety/equipment, From elevated surface, Max assist   Step pivot transfers: Min assist, +2 physical assistance, +2 safety/equipment       General transfer comment: pt able to stand from EOB on second trial (slightly elevated). cues for hand placement, heavy truncal flexion noted with pt then able to push shoulders upright in standing. able to take steps to recliner fairly well but increased cues for directions to L side (pt later reporting L eye visual deficits). Attempted to stand from recliner multiple times with pt able to partially clear bottom but unable to fully get upright. with each trial, able to slide maxisky lift pad under LE           Balance Overall balance assessment: Needs assistance Sitting-balance support: No upper extremity supported, Feet supported Sitting balance-[name]: Fair     Standing balance support: Bilateral upper extremity supported, During functional activity Standing balance-[name]: Poor  Pertinent Vitals/Pain Pain Assessment Pain Assessment: No/denies pain    Home Living  Family/patient expects to be discharged to:: Private residence Living Arrangements: Children Available Help at Discharge: Family;Available 24 hours/day Type of Home: House         Home Layout: One level Home Equipment: Agricultural consultant (2 wheels);Rollator (4 wheels);Wheelchair - manual Additional Comments: difficulty gathering home setup info from pt/son due to inconsistent setup reports. lives with son who has his own health issues    Prior Function Prior Level of Function : Patient poor historian/Family not available;Needs assist;History of Falls (last six months)             Mobility Comments: reports using walker for mobility ADLs Comments: reports able to manage ADLs but difficult. son assisting with IADLs though pt drives     Extremity/Trunk Assessment   Upper Extremity Assessment Upper Extremity Assessment: Defer to OT evaluation    Lower Extremity Assessment Lower Extremity Assessment: RLE deficits/detail;LLE deficits/detail RLE Deficits / Details: increased LE edema, limiting ROM, streght grossly 3+/5 RLE Coordination: decreased fine motor;decreased gross motor LLE Deficits / Details: increased LE edema, limiting ROM, streght grossly 3+/5 LLE Coordination: decreased fine motor;decreased gross motor    Cervical / Trunk Assessment Cervical / Trunk Assessment: Other exceptions Cervical / Trunk Exceptions: increased body habitus  Communication   Communication Communication: No apparent difficulties    Cognition Arousal: Alert Behavior During Therapy: WFL for tasks assessed/performed, Impulsive                             Following commands: Impaired Following commands impaired: Follows one step commands with increased time, Follows multi-step commands inconsistently     Cueing Cueing Techniques: Verbal cues, Gestural cues, Tactile cues, Visual cues     General Comments General comments (skin integrity, edema, etc.): Son present. Footrest  malfunctioning and would not stay up at end of session. Located armchair to place under to elevate B feet.        Assessment/Plan    PT Assessment Patient needs continued PT services  PT Problem List Decreased strength;Decreased range of motion;Decreased activity tolerance;Decreased balance;Decreased mobility;Decreased coordination;Decreased cognition;Cardiopulmonary status limiting activity;Pain;Decreased safety awareness       PT Treatment Interventions DME instruction;Gait training;Functional mobility training;Therapeutic activities;Therapeutic exercise;Balance training;Cognitive remediation;Patient/family education    PT Goals (Current goals can be found in the Care Plan section)  Acute Rehab PT Goals PT Goal Formulation: With patient/family Time For Goal Achievement: 11/21/23 Potential to Achieve Goals: Fair    Frequency Min 2X/week     Co-evaluation   Reason for Co-Treatment: For patient/therapist safety;To address functional/ADL transfers;Necessary to address cognition/behavior during functional activity   OT goals addressed during session: ADL's and self-care;Proper use of Adaptive equipment and DME;Strengthening/ROM       AM-PAC PT "6 Clicks" Mobility  Outcome Measure Help needed turning from your back to your side while in a flat bed without using bedrails?: A Little Help needed moving from lying on your back to sitting on the side of a flat bed without using bedrails?: A Lot Help needed moving to and from a bed to a chair (including a wheelchair)?: Total Help needed standing up from a chair using your arms (e.g., wheelchair or bedside chair)?: Total Help needed to walk in hospital room?: Total Help needed climbing 3-5 steps with a railing? : Total 6 Click Score: 9    End of Session   Activity Tolerance: Patient limited by  fatigue Patient left: in chair;with call bell/phone within reach;with chair alarm set;with family/visitor present Nurse Communication:  Mobility status;Need for lift equipment (Lift pad under patient) PT Visit Diagnosis: Unsteadiness on feet (R26.81);Other abnormalities of gait and mobility (R26.89);Repeated falls (R29.6);Muscle weakness (generalized) (M62.81);History of falling (Z91.81);Difficulty in walking, not elsewhere classified (R26.2)    Time: 1335-1410 PT Time Calculation (min) (ACUTE ONLY): 35 min   Charges:   PT Evaluation $PT Eval Moderate Complexity: 1 Mod   PT General Charges $$ ACUTE PT VISIT: 1 Visit         Tequia Ahart B. [name] PT, DPT Acute Rehabilitation Services Please use secure chat or  Call Office [phone]   [name] [facility] 11/07/2023, 3:05 PM

## 2023-11-07 NOTE — Hospital Course (Addendum)
 Brianna Wheeler is a 68 y.o.female with a history of type 2 diabetes, hypertension, hypothyroidism, hyperlipidemia who was admitted to the family medicine teaching Service at [facility] for altered mental status. Her hospital course is detailed below:  AMS Presented via EMS for 1 day of altered mental status.  Per son patient went to the bathroom and fell and was moaning and moving around but did not answer any questions.  In the ED patient was noted to be jaundice, altered, febrile. Labs significant for lactic acidosis of 4.3, leukocytosis, elevated ammonia and CK.  UA with evidence of infection. Chest x-ray and CT head unremarkable. CT AP showed splenic and or portal vein thrombus. Patient was started on broad-spectrum antibiotics and given IV fluids. When patient able to PO, discontinued fluids.   EEG and which was concerning for diffuse encephalopathy likely related to toxic metabolic cause.  IR was consulted for LP given unknown known etiology of patient's altered mental status.  LP was completed on 4/17.  Meningitis/encephalitis panel negative. Patient was treated with lactulose  with greatly improved her mental status.   Portal Vein Thrombus Elevated Bilirubin Transaminitis New NASH Cirrhosis  Initial lab work and imaging concerning for hepatic dysfunction given elevated direct and indirect bilirubin, transaminitis, and concern for portal vein thrombus seen on CT.  Right upper quadrant ultrasound with Doppler ordered which showed portal vein thrombus which was nearly occlusive with slow flow detected did with low portal vein velocities along with suspected underlying cirrhosis.  GI was consulted who suspect new diagnosis of cirrhosis with decompensation without known etiology at this time.  Patient was treated with lactulose , which improved her mental status.  Given hematology did not recommend anticoagulation at this time, GI opted not to anticoagulate for PVT.  An EGD was planned prior to discharge  for concern of varices. This was done on 4/23. EGD did not show any varices but did show gastritis. GI signed off on 4/23.    Abnormal Labs MGUS Given initial labs concerning for thrombocytopenia and liver dysfunction, obtained haptoglobin, fibrinogen , D-dimer, reticulocyte count and LDH.  Labs consistent with DIC, though smear did not show any abnormal morphology of red blood cells or platelets.  Consulted hematology for concern.  Hematology believe that patient's thrombocytopenia, elevated PTT, and DIC labs were not concerning for DIC.  They believe that this all is related to liver dysfunction. They recommended to hold on any anticoagulation. Work up started for liver dysfunction including hemochromatosis work up, wilsons disease work up, gammopathies, and other autoimmune causes. Hematology continued to follow and recommended bone marrow aspiration which was done on 4/23.   Patient was diagnosed with myeloma. Patient to follow up with Heme/Onc for treatment.   Vit D deficiency Patient had both low Vit D-25 dihydroxy as well as Vit 1,25-dihydroxy, perhaps in the setting of liver dysfunction. Vit D was supplemented. Recommend follow up of Vit D labs to ensure dysfunction is limited to liver without renal involvement.   Urinary retention Patient had foley placed on 4/19 due to AMS and this was removed on 4/23 after improvement in mobility. However, patient was unable to void and required I/O catheterization. Patient demonstrated ability to void spontaneously on 4/26 and did not have any further episodes of urinary retention***.  Safe Disposition PT/OT recommended SNF but patient refused due to having to care for her adult son with developmental delay and seizures. Patient did have capacity to refuse SNF, however, APS was involved due to concern of it being unsafe  for her and her son to live along without assistance. Ultimately, it was decided that patient would discharge to SNF and son would stay at  home with neighbor checking in.  APS cleared this plan, and patient was medically stable for discharge to SNF.    Other chronic conditions were medically managed with home medications and formulary alternatives as necessary  PCP Follow-up Recommendations: Needs f/u with hematology outpatient for myeloma diagnosis during admission. Patient will need to be set up with GI for new NASH cirrhosis diagnosis. Patient c/o of blood in urine, blood clots on pelvic exam, TVUS with thickened endometrium to 15 mm: will need endometrial biopsy outpatient - referral placed by inpt team, please ensure pt is not lost to follow up. Follow up on 1,25 Vit D level in 8 weeks. Held BP meds ISO normotensive BP, consider adjusting versus restarting. Blood glucose normal during admission, consider regimen adjustments as needed. Consider repeating CBC and CMP outpatient for follow up of history of hx of hypokalemia and liver enzymes.  Consider repeat CBC for thrombocytopenia. Patient is open to follow up with palliative care. Please place palliative care consult.

## 2023-11-07 NOTE — Progress Notes (Signed)
 Daily Progress Note Intern Pager: [phone]  Patient name: Brianna Wheeler Medical record number: [id number] Date of birth: 12/28/1955 Age: 68 y.o. Gender: female  Primary Care Provider: Caro Laroche, DO Consultants: IR, Heme-Onc, GI  Code Status: Full   Pt Overview and Major Events to Date:  4/16: admitted   Assessment and Plan: Patient is a 68 yo F with PMHx of HTN, Hypothyroidism, HLD, osteoarthritis, GERD admitted for acute encephalopathy.   Assessment & Plan Acute metabolic encephalopathy Patient appears much more alert today. EEG concerning for diffuse encephalopathy likely related to toxic-metabolic cause. Blood culture positive for staph epidermis, likely contaminant. Urine culture with gram negative rods, awaiting speciation. VBG largely wnl.  - GI consulted, appreciate recs  - continue meningitic dosing of Abx (ctx, vanc, amp) and acyclovir, consider de-escalating today vs after LP performed. Added on flagyl for enteric coverage.  - will increase IVF to 100 ml/hr  - IR to attempt LP again today - Pending RUQ [place] read - continue lactulose BID, will transition to PO given patient is much more alert  - urine culture pending - AM CBC, CMP - will add on diet as patient is much more alert - continue fall and seizure precautions  Thrombosis - holding DVT ppx in setting of possible DIC? Though will consider anticoagulation today based on GI recs and Heme/Onc  Swelling of both lower extremities Bilateral DVT US negative. Swelling likely ISO of anasarca d/t hepatic dysfunction. .  Hyperbilirubinemia with Jaundice Labs concerning for DIC with decreased fibrinogen, decreased platelets, and elevated d-dimer, though smear was largely normal. Discussed with heme-onc who do not believe this is DIC, but likely due to sepsis and hepatic dysfunction. Bilirubin improved today from 6.8 > 4.0. Platelets decreased from 86 > 55. LDH elevated. Still do not have read for RUQ ultrasound.   - GI consulted, appreciate recs - awaiting RUQ ultrasound - AM hepatic function panel   Chronic health problem DMII: awaiting A1C, continue CBG checks  HTN: holding medications ISO of hypotension HLD: will hold statin Hypothyroidism: TSH elevated, but T4 normal. Will restart synthroid   FEN/GI: regular diet  PPx: SCDs, holding DVT ppx ISO  Dispo: awaiting   Subjective:  Patient reports she feels much better this AM. Reports some polyuria at home prior to admission. Does not recall the event.  Objective: Temp:  [98.3 F (36.8 C)-99.9 F (37.7 C)] 98.7 F (37.1 C) (04/17 0800) Pulse Rate:  [68-100] 80 (04/17 0800) Resp:  [9-25] 14 (04/17 0800) BP: (88-130)/(37-74) 101/62 (04/17 0800) SpO2:  [98 %-100 %] 100 % (04/17 0800) Physical Exam: General: jaundiced, NAD Cardiovascular: RRR, systolic murmur Respiratory: NWOB on RA Abdomen: soft, mildly TTP along RUQ Extremities: bilateral legs with pitting edema 2+, not TTP, no increased warmth noted on left leg this AM.   Laboratory: Most recent CBC Lab Results  Component Value Date   WBC 8.9 11/07/2023   HGB 9.6 (L) 11/07/2023   HCT 28.3 (L) 11/07/2023   MCV 102.5 (H) 11/07/2023   PLT 55 (L) 11/07/2023   Most recent BMP    Latest Ref Rng & Units 11/07/2023    5:11 AM  BMP  Glucose 70 - 99 mg/dL 696   BUN 8 - 23 mg/dL 12   Creatinine 2.95 - 1.00 mg/dL 2.84   Sodium 132 - 440 mmol/L 135   Potassium 3.5 - 5.1 mmol/L 3.4   Chloride 98 - 111 mmol/L 108   CO2 22 - 32  mmol/L 24   Calcium 8.9 - 10.3 mg/dL 7.7    Urine Culture [[id number]] (Abnormal) Collected: 11/06/23 0100  Specimen: Urine, Clean Catch Updated: 11/07/23 0943   Specimen Description URINE, CLEAN CATCH   Special Requests NONE   Culture -- Abnormal    >=100,000 COLONIES/mL ESCHERICHIA COLI CULTURE REINCUBATED FOR BETTER GROWTH SUSCEPTIBILITIES TO FOLLOW Performed at [facility], [address] [place], [place] [address]  Abnormal    Report Status  PENDING     Imaging/Diagnostic Tests:  RUQ US  with Liver Doppler:  1. Suspect portal vein thrombus which is nearly occlusive with some slow flow detected in the portal vein by duplex ultrasound. Low portal vein velocities suggest component of underlying portal hypertension. 2. Suspect underlying cirrhosis. 3. Otherwise limited study with lack of visualization of multiple veins and hepatic artery as above. 4. No ascites detected.  Derril Flint Bird Tailor, MD 11/07/2023, 8:14 AM  PGY-1, [facility] Family Medicine FPTS Intern pager: [phone], text pages welcome Secure chat group [facility] Teaching Service

## 2023-11-07 NOTE — Assessment & Plan Note (Addendum)
 DMII: awaiting A1C, continue CBG checks  HTN: holding medications ISO of hypotension HLD: will hold statin Hypothyroidism: TSH elevated, but T4 normal. Will restart synthroid

## 2023-11-07 NOTE — Procedures (Addendum)
 PROCEDURE SUMMARY:  Successful fluoroscopic guided lumbar puncture. Return of 6.5 cc or blood-tinged CFS.  No immediate complications.  Pt tolerated well.   EBL = none  Please see full dictation in imaging section of Epic for procedure details.

## 2023-11-07 NOTE — Consult Note (Signed)
 Brianna Wheeler is a very nice 68 year old white female.  She is in with her son.  She came into the ER yesterday.  She came in by ambulance.  She was very confused.  She had lab work which showed a high bilirubin.  Most of it was indirect.  Her ammonia was 91.  Her SGPT was 30 SGOT 72.  BUN 7 creatinine 1.01.  Her CK was 248.  Her white cell count was 11.  Hemoglobin 12.1.  Platelet count 86,000.  Showed normal white cell differential.  Her reticulocyte count was 2.7.  There was concern over DIC.  Her D-dimer was greater than 20.  Her fibrinogen was 130.  Her INR was 2.1.  She had a CT of the body.  This really was unremarkable.  However, there was probable thrombus of the splenic vein or portal vein.  There was some remote compression fractures.  She had body wall edema.  I think that is interesting that her albumin is so low.  When she came in, her albumin was 1.9.  Her total protein was 6.9.  She had Dopplers of her legs.  These were negative for thromboembolic disease.  She has a blood culture that is positive for Staph epi.  This might be a contaminant.  It is hard to say.  She is on IV antibiotics.  Apparently, this morning she is certainly much more alert.  She is pretty oriented.  Her labs show a BUN 12 creatinine 0.95.  Calcium 7.7 with an albumin of 1.5.  Her bilirubin is 4.0.  Surprisingly, her white cell count is 8.9.  Hemoglobin 9.6.  Platelet count 55,000.  I did look at her blood smear when she came in yesterday.  I really do not see anything that was pathologic.  I do not see any schistocytes.  She had no nucleated red blood cells.  Her white blood cells appeared may be toxic.  I did serially immature myeloid cells.  There were no rouleaux formation.  Platelets were slightly decreased in number.  She was not sure when she had her last mammogram.  I think she does a Cologuard.  She has never smoked.  There is no history of alcohol use.  She has had back surgery.  She did come in  with a temperature.   On her physical exam, temperature 98.3.  Pulse 76.  Blood pressure 100/58.  Her head neck exam shows some slight scleral icterus.  She has dry oral mucosa.  She has no thrush.  There is no adenopathy in the neck.  Lungs are relatively clear bilaterally.  She has decent air movement bilaterally.  Cardiac exam regular rate and rhythm.  She may have a 1/6 systolic ejection murmur.  Abdomen is soft.  She is somewhat obese.  She has no fluid wave.  There is no guarding or rebound tenderness.  I really cannot palpate her liver or spleen tip.  Extremity shows marked edema in the legs bilaterally.  Skin exam shows no rashes.  Neurological exam shows no focal neurological deficits.   Brianna Wheeler is a very nice 68 year old white female.  It is hard to say what is going on although I have believe that she is septic and I suspect this is probably causing the hematologic issues.  I will surprised that her platelet count dropped as much as it did.  I am also surprised that the hemoglobin dropped as much as it did.  Again she may have been significantly dehydrated.  I have to believe that she has some type of hepatic issue given that she has such a low albumin.  Her PT is also somewhat elevated.  I am really not too worried about this DIC.  The positive blood culture could be a contaminant given the bacteria.  However, I would treat her aggressively with IV antibiotics as I had believe that sepsis is a part of the picture.  Again, I find interesting that her albumin is so low.  Also her ammonia was quite high.  I do not know if there is any type of hepatic issue.  I do not know if this might be reflective of a portal vein thrombus.    For right now, I would hold on anticoagulation.  It will be interesting to see what the urine culture shows.  I had to leave that her hematologic parameters will improve with treating her infections.  We will follow along and help out any way that we  can.   Charmayne Cooper, MD  Autry Legions 6:51

## 2023-11-07 NOTE — Progress Notes (Signed)
 PHARMACY - PHYSICIAN COMMUNICATION CRITICAL VALUE ALERT - BLOOD CULTURE IDENTIFICATION (BCID)  Brianna Wheeler is an 68 y.o. female who presented to [facility] on 11/06/2023 with a chief complaint of acute encephalopathy   Name of physician (or Provider) Contacted: Dr. Baby Bolt  Current antibiotics: Vancomycin, Ceftriaxone, Ampicillin, Flagyl, Acyclovir   Changes to prescribed antibiotics recommended:  No changes   Results for orders placed or performed during the hospital encounter of 11/06/23  Blood Culture ID Panel (Reflexed) (Collected: 11/06/2023 12:40 AM)  Result Value Ref Range   Enterococcus faecalis NOT DETECTED NOT DETECTED   Enterococcus Faecium NOT DETECTED NOT DETECTED   Listeria monocytogenes NOT DETECTED NOT DETECTED   Staphylococcus species DETECTED (A) NOT DETECTED   Staphylococcus aureus (BCID) NOT DETECTED NOT DETECTED   Staphylococcus epidermidis DETECTED (A) NOT DETECTED   Staphylococcus lugdunensis NOT DETECTED NOT DETECTED   Streptococcus species NOT DETECTED NOT DETECTED   Streptococcus agalactiae NOT DETECTED NOT DETECTED   Streptococcus pneumoniae NOT DETECTED NOT DETECTED   Streptococcus pyogenes NOT DETECTED NOT DETECTED   A.calcoaceticus-baumannii NOT DETECTED NOT DETECTED   Bacteroides fragilis NOT DETECTED NOT DETECTED   Enterobacterales NOT DETECTED NOT DETECTED   Enterobacter cloacae complex NOT DETECTED NOT DETECTED   Escherichia coli NOT DETECTED NOT DETECTED   Klebsiella aerogenes NOT DETECTED NOT DETECTED   Klebsiella oxytoca NOT DETECTED NOT DETECTED   Klebsiella pneumoniae NOT DETECTED NOT DETECTED   Proteus species NOT DETECTED NOT DETECTED   Salmonella species NOT DETECTED NOT DETECTED   Serratia marcescens NOT DETECTED NOT DETECTED   Haemophilus influenzae NOT DETECTED NOT DETECTED   Neisseria meningitidis NOT DETECTED NOT DETECTED   Pseudomonas aeruginosa NOT DETECTED NOT DETECTED   Stenotrophomonas maltophilia NOT DETECTED NOT DETECTED    Candida albicans NOT DETECTED NOT DETECTED   Candida auris NOT DETECTED NOT DETECTED   Candida glabrata NOT DETECTED NOT DETECTED   Candida krusei NOT DETECTED NOT DETECTED   Candida parapsilosis NOT DETECTED NOT DETECTED   Candida tropicalis NOT DETECTED NOT DETECTED   Cryptococcus neoformans/gattii NOT DETECTED NOT DETECTED   Methicillin resistance mecA/C NOT DETECTED NOT DETECTED    Silvestre Drum, PharmD, BCPS Clinical Pharmacist Phone: [phone]

## 2023-11-07 NOTE — Assessment & Plan Note (Signed)
 Bilateral DVT US  negative. Swelling likely ISO of anasarca d/t hepatic dysfunction. Aaron Aas

## 2023-11-07 NOTE — Progress Notes (Signed)
 CSW received call from [facility], International aid/development worker at [place] [employer] [phone]) who states a coworker did come to the hospital yesterday to meet patient and [name]. There was one family member identified and after [employer] contacted them, it was deemed that the family member was not willing or able to accept patient's son into their home while patient is hospitalized. Shonte states that per the initial meeting with patient and [name] appears to have severe separation anxiety which causes him to be in distress. Shonte states due to there being no family or friends willing or able to assist, that Brianna Wheeler should remain at bedside with patient's bedside. Shonte agreeable to receive questions or concerns via phone call if they arise.  Shepard Dicker, MSW, LCSW Transitions of Care  Clinical Social Worker II [phone]

## 2023-11-07 NOTE — Evaluation (Signed)
 Clinical/Bedside Swallow Evaluation Patient Details  Name: Brianna Wheeler MRN: [id number] Date of Birth: 11-Nov-1955  Today's Date: 11/07/2023 Time: SLP Start Time (ACUTE ONLY): 0918 SLP Stop Time (ACUTE ONLY): 0929 SLP Time Calculation (min) (ACUTE ONLY): 11 min  Past Medical History:  Past Medical History:  Diagnosis Date   Diabetes mellitus    Hiatal hernia 09/19/2006   Qualifier: Diagnosis of   By: Winona Haw         Hyperlipidemia    Hypertension    Left foot pain 07/24/2003   Chronic injury -- multiple broken bones and torn ligaments after accident in garage   Past Surgical History:  Past Surgical History:  Procedure Laterality Date   EYE SURGERY     LUMBAR DISC SURGERY     TUBAL LIGATION     HPI:  68 yo female presented to ED with acute encephalopathy, sepsis of unknown source, Bil LE edema, thrombosis of splenic vein/portal vein. PMH HTN, hypothyroidism, prediabetes, hyperlipidemia, osteoarthritis, vertebral compression fractures, GERD, asthma    Assessment / Plan / Recommendation  Clinical Impression  Pt is alert and communicative this morning. She participated in a clinical swallowing evaluation, demonstrating normal oropharyngeal swallow function. Oral mechanism exam WFL. Dentition present, poor condition. No focal CN deficits. Demonstrated normal mastication, the appearance of a brisk swallow response, no s/s of aspiration.  Resume a regular diet, thin liquids. No SLP f/u is needed. SLP Visit Diagnosis: Dysphagia, unspecified (R13.10)    Aspiration Risk  No limitations    Diet Recommendation   Thin;Age appropriate regular  Medication Administration: Whole meds with liquid    Other  Recommendations Oral Care Recommendations: Oral care BID    Recommendations for follow up therapy are one component of a multi-disciplinary discharge planning process, led by the attending physician.  Recommendations may be updated based on patient status, additional functional  criteria and insurance authorization.  Follow up Recommendations No SLP follow up        Swallow Study   General Date of Onset: 11/06/23 HPI: 67 yo female presented to ED with acute encephalopathy, sepsis of unknown source, Bil LE edema, thrombosis of splenic vein/portal vein. PMH HTN, hypothyroidism, prediabetes, hyperlipidemia, osteoarthritis, vertebral compression fractures, GERD, asthma Type of Study: Bedside Swallow Evaluation Previous Swallow Assessment: no Diet Prior to this Study: NPO Temperature Spikes Noted: No Respiratory Status: Room air History of Recent Intubation: No Behavior/Cognition: Alert;Cooperative;Pleasant mood Oral Cavity Assessment: Within Functional Limits Oral Care Completed by SLP: No Oral Cavity - Dentition: Poor condition Vision: Functional for self-feeding Self-Feeding Abilities: Able to feed self Patient Positioning: Upright in bed Baseline Vocal Quality: Normal Volitional Cough: Strong Volitional Swallow: Able to elicit    Oral/Motor/Sensory Function Overall Oral Motor/Sensory Function: Within functional limits   Ice Chips Ice chips: Within functional limits   Thin Liquid Thin Liquid: Within functional limits    Nectar Thick Nectar Thick Liquid: Not tested   Honey Thick Honey Thick Liquid: Not tested   Puree Puree: Within functional limits   Solid     Solid: Within functional limits      Myna Asal Laurice 11/07/2023,9:35 AM Mylinda Asa L. Beatris Lincoln, MA CCC/SLP Clinical Specialist - Acute Care SLP Acute Rehabilitation Services Office number [phone]

## 2023-11-07 NOTE — Discharge Instructions (Addendum)
 Dear [name],  Thank you for letting us  participate in your care. You were hospitalized for altered mental status and diagnosed with encephalopathy and a UTI, which means that you were just not acting like yourself probably in the setting of your liver not working the way it should.  We treated you with medication that helps bring down toxic levels of ammonia and antibiotics for UTI.  We discussed whether GI colleagues who believe that you may have new onset cirrhosis, or scarring of your liver which caused this to happen.  Thankfully with the medication, your mental status improved. During your admission, some of your lab work was concerning for dysfunction in important blood products, where is why we involved the blood doctors (hematology). We recommend you follow up with your GI doctor and the hematologist outpatient.   POST-HOSPITAL & CARE INSTRUCTIONS  Go to your follow up appointments (listed below)   DOCTOR'S APPOINTMENT   Future Appointments  Date Time Provider Department Center  10/01/2024 11:50 AM [facility] ANNUAL WELLNESS VISIT [facility]     Take care and be well!  Family Medicine Teaching Service Inpatient Team [place]  [facility]  [address] [place], [place] [address] [phone]

## 2023-11-07 NOTE — Assessment & Plan Note (Signed)
 Labs concerning for DIC with decreased fibrinogen, decreased platelets, and elevated d-dimer, though smear was largely normal. Discussed with heme-onc who do not believe this is DIC, but likely due to sepsis and hepatic dysfunction. Bilirubin improved today from 6.8 > 4.0. Platelets decreased from 86 > 55. LDH elevated. Still do not have read for RUQ ultrasound.  - GI consulted, appreciate recs - awaiting RUQ ultrasound - AM hepatic function panel

## 2023-11-07 NOTE — Assessment & Plan Note (Addendum)
-   holding DVT ppx in setting of possible DIC? Though will consider anticoagulation today based on GI recs and Heme/Onc

## 2023-11-07 NOTE — Consult Note (Signed)
 Consultation Note   Referring Provider:  Family Medicine Teaching Service PCP: Kandis Ormond, DO Primary Gastroenterologist::    Para Bold      Reason for Consultation: Jaundice and splenic vein thrombosis DOA: 11/06/2023         Hospital Day: 2   ASSESSMENT    68 y.o. year old female with a medical history including but not limited to hypertension, hypothyroidism, hyperlipidemia, osteoarthritis, GERD, asthma, cholelithiasis. Admitted with AMS and sepsis  Sepsis, workup in progress.  CT scan chest abdomen pelvis unrevealing Urine culture is positive for E. Coli Blood cultures positive for staph but could be contamination S/p lumbar puncture this am Suspected to have cirrhosis but SBP not suspected in absence of ascites  She is getting multiple IV antibiotics for sepsis  Suspected new diagnosis of cirrhosis with decompensation as evidenced by Jaundice, encephalopathy (probably HE), thrombocytopenia  and coagulopathy. No ascites. Etiology of cirrhosis not yet know.  MELD 3.0: 25 at 11/07/2023  5:11 AM Decompensation could be 2/2 to infectious process ( UTI) Mentation normal after lactulose enemas.   Portal vein thrombosis.  Chronic? Hematology has evaluated and recommends holding on anticoagulation for PVT for now.  Macrocytosis, new anemia Hgb 13.4 in Aug 2023. Presented this admission with hgb of 12.1. Today hgb 9.6 but could be dilutional She does report dark but no melenic stools  See PMH for additional history     PLAN:   --Hematology not recommending anticoagulation for PVT at this point --She is status post lactulose enema x 2. Normal mentation today. Continue Lactulose 20 g TID. Titrate to 3 -4 BMs /daily --Will need eventual hepatic serologic workup to look for causes of cirrhosis. Possibly NAFLD given morbid obesity  --HCC screening: No focal liver lesions on CT scan or US  w doppler. Will get AFP --Eventual EGD  for varices screening --B12 level, folate, iron studies   HPI   Brianna Wheeler was admitted on 4/16 after being brought to the ED for altered mental status and jaundice.  Admitting diagnosis was sepsis of unknown source.  Her bilirubin was 6.1 , predominantly indirect  ( 4.2).  There was mild elevation of transaminases, alkaline phosphatase was normal.  Her INR was elevated at 91. CT scan with contrast - possible splenic vein/portal vein thrombosis.  Unremarkable appearing liver and spleen . Over the last couple of days her total bilirubin has declined to 4.   CTAP with contrast 1. No acute intrathoracic pathology identified. 2. Moderate hiatal hernia. 3. Cholelithiasis. 4. Probable thrombosis of the splenic vein and/or portal vein with portosystemic collateralization to the left renal vein as described above. This is not well assessed on this noncontrast examination. This could be better assessed with contrast enhanced CT imaging or hepatic Doppler sh 5. Moderate diffuse subcutaneous body wall edema. 6. Remote T10, T11, and L3 compression deformities.   US  with liver doppler IMPRESSION: 1. Suspect portal vein thrombus which is nearly occlusive with some slow flow detected in the portal vein by duplex ultrasound. Low portal vein velocities suggest component of underlying portal hypertension. 2. Suspect underlying cirrhosis. 3. Otherwise limited study with lack of visualization of multiple veins and hepatic artery as above. 4. No ascites detected.    Brianna Wheeler  wasn't aware of any prior liver disease. She has no FMH of liver disease. She hasn't ever consumed much Etoh. She lives with son who is present in room. Prior to this hospitalization there was no confusion at home. She does endorse dark but no melenic stools at times. She has never undergone colon cancer screening. Has Cologuard at home - not completed.    Previous GI Studies   Aug 2024.  Negative HBV surface ag Negative HCV  ab   Labs and Imaging:  Recent Labs    11/06/23 0035 11/07/23 0511  WBC 11.0* 8.9  HGB 12.1 9.6*  HCT 37.6 28.3*  MCV 107.7* 102.5*  PLT 86* 55*   No results for input(s): "FOLATE", "VITAMINB12", "FERRITIN", "TIBC", "IRONPCTSAT" in the last 72 hours. Recent Labs    11/06/23 0035 11/06/23 0442 11/07/23 0511  NA 135 134* 135  K 3.9 3.9 3.4*  CL 103 106 108  CO2 19* 20* 24  GLUCOSE 87 75 107*  BUN 7* 9 12  CREATININE 1.03* 1.07* 0.95  CALCIUM 8.5* 8.2* 7.7*   Recent Labs    11/06/23 0035 11/06/23 0442 11/07/23 0511  PROT 7.0 6.9  7.0 5.5*  ALBUMIN 2.0* 1.9*  2.0* 1.5*  AST 72* 73*  71* 130*  ALT 30 32  27 33  ALKPHOS 74 72  65 52  BILITOT 6.1* 6.5*  6.8* 4.0*  BILIDIR  --  2.3*  --   IBILI  --  4.2*  --    Recent Labs    11/06/23 0442  INR 2.1*   No results for input(s): "AFPTUMOR" in the last 72 hours.  US ABDOMEN LIMITED WITH LIVER DOPPLER CLINICAL DATA:  Abnormal liver function tests and high ammonia level.  EXAM: DUPLEX ULTRASOUND OF LIVER  TECHNIQUE: Color and duplex Doppler ultrasound was performed to evaluate the hepatic in-flow and out-flow vessels.  COMPARISON:  None Available.  FINDINGS: Liver: Heterogeneous liver parenchyma. There may be a component of underlying cirrhosis. No focal lesion, mass or intrahepatic biliary ductal dilatation identified by ultrasound.  Main Portal Vein size: 1.9 cm  Portal Vein Velocities  Main Prox:  14 cm/sec  Main Mid: 13 cm/sec  Main Dist:  17 cm/sec Right: Not visualized  Left: Not visualized  Hepatic Vein Velocities  Right:  24 cm/sec  Middle:  Not visualized  Left:  Not visualized  IVC: Present and patent with normal respiratory phasicity.  Hepatic Artery Velocity:  Not visualized  Splenic Vein Velocity:  26 cm/sec  Spleen: 14.2 cm x 5.8 cm x 5.7 cm with a total volume of 243 cm^3 (411 cm^3 is upper limit normal)  Portal Vein Occlusion/Thrombus: Visualization of the main  portal vein is suboptimal but there does appear to be probable nearly occlusive thrombus in the visualized portal vein with some flow detected.  Splenic Vein Occlusion/Thrombus: No  Ascites: None  Varices: None  Low portal vein velocities may be related to the nonocclusive thrombus but a component of portal hypertension is suspected.  IMPRESSION: 1. Suspect portal vein thrombus which is nearly occlusive with some slow flow detected in the portal vein by duplex ultrasound. Low portal vein velocities suggest component of underlying portal hypertension. 2. Suspect underlying cirrhosis. 3. Otherwise limited study with lack of visualization of multiple veins and hepatic artery as above. 4. No ascites detected.  Electronically Signed   By: Irish Lack M.D.   On: 11/07/2023 08:30  EEG suggestive of moderate to severe diffuse encephalopathy likely related to  toxic-metabolic causes.   Past Medical History:  Diagnosis Date   Diabetes mellitus    Hiatal hernia 09/19/2006   Qualifier: Diagnosis of   By: Winona Haw         Hyperlipidemia    Hypertension    Left foot pain 07/24/2003   Chronic injury -- multiple broken bones and torn ligaments after accident in garage    Past Surgical History:  Procedure Laterality Date   EYE SURGERY     LUMBAR DISC SURGERY     TUBAL LIGATION      Family History  Problem Relation Age of Onset   Cancer Sister        brain   Diabetes Sister    Heart disease Sister    Heart attack Sister    Epilepsy Son     Prior to Admission medications   Medication Sig Start Date End Date Taking? Authorizing Provider  albuterol (VENTOLIN HFA) 108 (90 Base) MCG/ACT inhaler Inhale 2 puffs into the lungs every 6 (six) hours as needed for wheezing or shortness of breath. 09/11/22  Yes Rochester Chuck, MD  Calcium Carb-Cholecalciferol (CALCIUM 1000 + D PO) Take by mouth.   Yes [provider]  diclofenac Sodium (VOLTAREN) 1 % GEL Apply 2 g  topically 4 (four) times daily. Patient taking differently: Apply 2 g topically daily as needed (low back pain; knee pain). 05/15/22  Yes Hensel, Azucena Bollard, MD  fexofenadine (ALLEGRA) 180 MG tablet Take 1 tablet (180 mg total) by mouth daily. 09/11/22 07/22/24 Yes Rochester Chuck, MD  fluticasone (FLONASE) 50 MCG/ACT nasal spray Place 2 sprays into both nostrils daily. Place 2 sprays in each nostril daily 09/11/22  Yes Rochester Chuck, MD  hydrochlorothiazide (HYDRODIURIL) 12.5 MG tablet Take 1 tablet (12.5 mg total) by mouth daily. 02/11/23  Yes Rumball, Alison M, DO  levothyroxine (EUTHYROX) 125 MCG tablet TAKE 1 TABLET BY MOUTH ONCE DAILY BEFORE BREAKFAST. NEED  TSH  CHECK  AND  OFFICE  VISIT  BEFORE  NEXT  REFILL. 09/13/23  Yes Azell Boll, MD  Multiple Vitamin (MULTIVITAMIN) tablet Take 1 tablet by mouth daily.   Yes [provider]  omeprazole (PRILOSEC) 20 MG capsule Take 1 capsule (20 mg total) by mouth daily. 04/10/23  Yes Rumball, Alison M, DO  potassium chloride SA (KLOR-CON M20) 20 MEQ tablet Take 1 tablet (20 mEq total) by mouth daily. 06/17/23  Yes Rumball, Alison M, DO  rosuvastatin (CRESTOR) 20 MG tablet Take 1 tablet by mouth once daily Patient taking differently: Take 20 mg by mouth at bedtime. 01/31/23  Yes Rumball, Alison M, DO  traMADol (ULTRAM) 50 MG tablet Take 1 tablet by mouth three times daily as needed 09/13/23  Yes Azell Boll, MD    Current Facility-Administered Medications  Medication Dose Route Frequency Provider Last Rate Last Admin   0.9 %  sodium chloride infusion   Intravenous Continuous Baloch, Mahnoor, MD 50 mL/hr at 11/07/23 0434 New Bag at 11/07/23 0434   0.9 %  sodium chloride infusion   Intravenous Continuous McDiarmid, Demetra Filter, MD       acetaminophen (TYLENOL) tablet 650 mg  650 mg Oral Q6H PRN Jonne Netters, MD       Or   acetaminophen (TYLENOL) suppository 650 mg  650 mg Rectal Q6H PRN Jonne Netters, MD       acyclovir  (ZOVIRAX) 980 mg in dextrose 5 % 250 mL IVPB  980 mg Intravenous Q8H [place], [place] D,  RPH   Stopped at 11/07/23 0530   ampicillin (OMNIPEN) 2 g in sodium chloride 0.9 % 100 mL IVPB  2 g Intravenous Q4H Cyndia Skeeters, DO   Stopped at 11/07/23 1610   cefTRIAXone (ROCEPHIN) 2 g in sodium chloride 0.9 % 100 mL IVPB  2 g Intravenous Q12H Baloch, Mahnoor, MD   Stopped at 11/06/23 2200   Chlorhexidine Gluconate Cloth 2 % PADS 6 each  6 each Topical Daily McDiarmid, Leighton Roach, MD       lactulose (CHRONULAC) enema 200 gm  300 mL Rectal BID Baloch, Mahnoor, MD   300 mL at 11/06/23 2025   metroNIDAZOLE (FLAGYL) IVPB 500 mg  500 mg Intravenous Q12H Celine Mans, MD   Stopped at 11/06/23 2200   potassium chloride 10 mEq in 100 mL IVPB  10 mEq Intravenous Q1 Hr x 4 Mahmood, Atif, MD 100 mL/hr at 11/07/23 0835 10 mEq at 11/07/23 0835   sodium chloride flush (NS) 0.9 % injection 10-40 mL  10-40 mL Intracatheter Q12H McDiarmid, Leighton Roach, MD   15 mL at 11/06/23 2122   sodium chloride flush (NS) 0.9 % injection 10-40 mL  10-40 mL Intracatheter PRN McDiarmid, Leighton Roach, MD       vancomycin (VANCOCIN) IVPB 1000 mg/200 mL premix  1,000 mg Intravenous Q12H Ilda Basset, [place]   Stopped at 11/07/23 0245    Allergies as of 11/06/2023 - Review Complete 11/06/2023  Allergen Reaction Noted   Hydrocodone-acetaminophen  09/28/2009   Nsaids  03/25/2018   Oxycodone-acetaminophen Other (See Comments) 09/20/2010   Tetracycline  02/11/2007   Bactrim [sulfamethoxazole-trimethoprim] Rash 04/06/2015    Social History   Socioeconomic History   Marital status: Widowed    Spouse name: Not on file   Number of children: 1   Years of education: 11.5   Highest education level: 11th grade  Occupational History   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    Son suffers from [place]. [facility] patient- Brianna Wheeler.)   Patient has her own vehicle and still drives .   Hobbies: cleaning house, likes home and garden things. Spending time with friends.      Social Drivers of Corporate investment banker Strain: Low Risk  (09/30/2023)   Overall Financial Resource Strain (CARDIA)    Difficulty of Paying Living Expenses: Not hard at all  Food Insecurity: No Food Insecurity (09/30/2023)   Hunger Vital Sign    Worried About Running Out of Food in the Last Year: Never true    Ran Out of Food in the Last Year: Never true  Transportation Needs: No Transportation Needs (09/30/2023)   PRAPARE - Administrator, Civil Service (Medical): No    Lack of Transportation (Non-Medical): No  Physical Activity: Insufficiently Active (09/30/2023)   Exercise Vital Sign    Days of Exercise per Week: 6 days    Minutes of Exercise per Session: 20 min  Stress: No Stress Concern Present (09/30/2023)   [employer] of Occupational Health - Occupational Stress Questionnaire    Feeling of Stress : Not at all  Social Connections: Moderately Integrated (09/30/2023)   Social Connection and Isolation Panel [NHANES]    Frequency of Communication with Friends and Family: More than three times a week    Frequency of Social Gatherings with Friends and Family: Three times a week    Attends Religious Services: More than 4 times per year    Active Member of Clubs or Organizations: Yes    Attends Banker Meetings: Never    Marital Status: Widowed  Intimate Partner Violence: Not At Risk (09/30/2023)   Humiliation, Afraid, Rape, and Kick questionnaire    Fear of Current or Ex-Partner: No    Emotionally Abused: No    Physically Abused: No    Sexually Abused: No     Code Status   Code Status:  Full Code  Review of Systems: All systems reviewed and negative except where noted in HPI.  Physical Exam: Vital signs in last 24 hours: Temp:  [98 F (36.7 C)-99.9 F (37.7 C)] 98 F (36.7 C) (04/17 0913) Pulse Rate:  [68-94] 80 (04/17 0800) Resp:  [9-25] 14 (04/17 0800) BP: (88-130)/(37-74) 101/62 (04/17 0800) SpO2:  [98 %-100 %] 100 % (04/17 0800)    General:  Pleasant morbidly obese female in NAD. She is in supine position post lumbar puncture so exam is limited Psych:  Cooperative. Normal mood and affect Eyes: Pupils equal Ears:  Normal auditory acuity Nose: No deformity, discharge or lesions Neck:  Supple, no masses felt Lungs:  Clear to auscultation.  Heart:  Regular rate, regular rhythm.  Abdomen:  Soft, nondistended, nontender, active bowel sounds, no masses felt Rectal :  Deferred Msk: Symmetrical without gross deformities.  Neurologic:  Alert, oriented, grossly normal neurologically Extremities : BLE pitting edema    Itake/Output from previous day: 04/16 0701 - 04/17 0700 In: 2051.3 [IV Piggyback:2051.3] Out: -  Intake/Output this shift:  No intake/output data recorded.   Brianna Schwalbe, NP-C   11/07/2023, 9:44 AM

## 2023-11-07 NOTE — ED Notes (Signed)
 Report given to [place], on 77M

## 2023-11-08 DIAGNOSIS — I82890 Acute embolism and thrombosis of other specified veins: Secondary | ICD-10-CM | POA: Diagnosis not present

## 2023-11-08 DIAGNOSIS — G9341 Metabolic encephalopathy: Secondary | ICD-10-CM | POA: Diagnosis not present

## 2023-11-08 DIAGNOSIS — I81 Portal vein thrombosis: Secondary | ICD-10-CM | POA: Diagnosis not present

## 2023-11-08 DIAGNOSIS — D696 Thrombocytopenia, unspecified: Secondary | ICD-10-CM | POA: Diagnosis not present

## 2023-11-08 DIAGNOSIS — K766 Portal hypertension: Secondary | ICD-10-CM | POA: Diagnosis not present

## 2023-11-08 DIAGNOSIS — N390 Urinary tract infection, site not specified: Secondary | ICD-10-CM | POA: Diagnosis not present

## 2023-11-08 DIAGNOSIS — R7989 Other specified abnormal findings of blood chemistry: Secondary | ICD-10-CM | POA: Diagnosis present

## 2023-11-08 DIAGNOSIS — D689 Coagulation defect, unspecified: Secondary | ICD-10-CM | POA: Diagnosis not present

## 2023-11-08 DIAGNOSIS — E538 Deficiency of other specified B group vitamins: Secondary | ICD-10-CM

## 2023-11-08 DIAGNOSIS — K7460 Unspecified cirrhosis of liver: Secondary | ICD-10-CM | POA: Diagnosis present

## 2023-11-08 DIAGNOSIS — A4151 Sepsis due to Escherichia coli [E. coli]: Secondary | ICD-10-CM | POA: Diagnosis not present

## 2023-11-08 DIAGNOSIS — K3189 Other diseases of stomach and duodenum: Secondary | ICD-10-CM

## 2023-11-08 LAB — VITAMIN D 25 HYDROXY (VIT D DEFICIENCY, FRACTURES): Vit D, 25-Hydroxy: 17.89 ng/mL — ABNORMAL LOW (ref 30–100)

## 2023-11-08 LAB — URINE CULTURE: Culture: >=100000 — AB

## 2023-11-08 LAB — COMPREHENSIVE METABOLIC PANEL WITH GFR
ALT: 39 U/L (ref 0–44)
AST: 143 U/L — ABNORMAL HIGH (ref 15–41)
Albumin: 1.5 g/dL — ABNORMAL LOW (ref 3.5–5.0)
Alkaline Phosphatase: 55 U/L (ref 38–126)
Anion gap: 5 (ref 5–15)
BUN: 12 mg/dL (ref 8–23)
CO2: 24 mmol/L (ref 22–32)
Calcium: 7.8 mg/dL — ABNORMAL LOW (ref 8.9–10.3)
Chloride: 105 mmol/L (ref 98–111)
Creatinine, Ser: 0.84 mg/dL (ref 0.44–1.00)
GFR, Estimated: >60 mL/min (ref >60)
Glucose, Bld: 89 mg/dL (ref 70–99)
Potassium: 3.3 mmol/L — ABNORMAL LOW (ref 3.5–5.1)
Sodium: 134 mmol/L — ABNORMAL LOW (ref 135–145)
Total Bilirubin: 3.4 mg/dL — ABNORMAL HIGH (ref 0.0–1.2)
Total Protein: 5.6 g/dL — ABNORMAL LOW (ref 6.5–8.1)

## 2023-11-08 LAB — FIBRINOGEN: Fibrinogen: 151 mg/dL — ABNORMAL LOW (ref 210–475)

## 2023-11-08 LAB — RETICULOCYTES
Immature Retic Fract: 13.5 % (ref 2.3–15.9)
RBC.: 2.74 MIL/uL — ABNORMAL LOW (ref 3.87–5.11)
Retic Count, Absolute: 71.5 10*3/uL (ref 19.0–186.0)
Retic Ct Pct: 2.6 % (ref 0.4–3.1)

## 2023-11-08 LAB — CBC
HCT: 28.1 % — ABNORMAL LOW (ref 36.0–46.0)
Hemoglobin: 9.5 g/dL — ABNORMAL LOW (ref 12.0–15.0)
MCH: 34.5 pg — ABNORMAL HIGH (ref 26.0–34.0)
MCHC: 33.8 g/dL (ref 30.0–36.0)
MCV: 102.2 fL — ABNORMAL HIGH (ref 80.0–100.0)
Platelets: 52 10*3/uL — ABNORMAL LOW (ref 150–400)
RBC: 2.75 MIL/uL — ABNORMAL LOW (ref 3.87–5.11)
RDW: 17.3 % — ABNORMAL HIGH (ref 11.5–15.5)
WBC: 4.9 10*3/uL (ref 4.0–10.5)
nRBC: 0 % (ref 0.0–0.2)

## 2023-11-08 LAB — PHOSPHORUS
Phosphorus: >30 mg/dL — ABNORMAL HIGH (ref 2.5–4.6)
Phosphorus: >30 mg/dL — ABNORMAL HIGH (ref 2.5–4.6)
Phosphorus: >30 mg/dL — ABNORMAL HIGH (ref 2.5–4.6)
Phosphorus: >30 mg/dL — ABNORMAL HIGH (ref 2.5–4.6)
Phosphorus: >30 mg/dL — ABNORMAL HIGH (ref 2.5–4.6)

## 2023-11-08 LAB — HEMOGLOBIN A1C
Hgb A1c MFr Bld: 4.3 % — ABNORMAL LOW (ref 4.8–5.6)
Mean Plasma Glucose: 77 mg/dL

## 2023-11-08 LAB — MISC LABCORP TEST (SEND OUT): Labcorp test code: 144000

## 2023-11-08 LAB — GLUCOSE, CAPILLARY
Glucose-Capillary: 99 mg/dL (ref 70–99)
Glucose-Capillary: 96 mg/dL (ref 70–99)
Glucose-Capillary: 89 mg/dL (ref 70–99)

## 2023-11-08 LAB — PROTIME-INR
INR: 2.2 — ABNORMAL HIGH (ref 0.8–1.2)
Prothrombin Time: 24.6 s — ABNORMAL HIGH (ref 11.4–15.2)

## 2023-11-08 LAB — CK: Total CK: 587 U/L — ABNORMAL HIGH (ref 38–234)

## 2023-11-08 LAB — MAGNESIUM: Magnesium: 1.8 mg/dL (ref 1.7–2.4)

## 2023-11-08 LAB — AFP TUMOR MARKER: AFP, Serum, Tumor Marker: 5.2 ng/mL (ref 0.0–9.2)

## 2023-11-08 LAB — LACTATE DEHYDROGENASE: LDH: 173 U/L (ref 98–192)

## 2023-11-08 LAB — URIC ACID: Uric Acid, Serum: 4.5 mg/dL (ref 2.5–7.1)

## 2023-11-08 MED ORDER — DICLOFENAC SODIUM 1 % EX GEL
2.0000 g | Freq: Four times a day (QID) | CUTANEOUS | Status: DC
  Administered 2023-11-08 – 2023-11-17 (×18): 2 g via TOPICAL
  Filled 2023-11-08 (×3): qty 100

## 2023-11-08 MED ORDER — POTASSIUM CHLORIDE CRYS ER 20 MEQ PO TBCR
40.0000 meq | EXTENDED_RELEASE_TABLET | Freq: Once | ORAL | Status: AC
  Administered 2023-11-08: 40 meq via ORAL
  Filled 2023-11-08: qty 2

## 2023-11-08 MED ORDER — GERHARDT'S BUTT CREAM
TOPICAL_CREAM | Freq: Every day | CUTANEOUS | Status: DC
  Filled 2023-11-08: qty 60

## 2023-11-08 MED ORDER — PANTOPRAZOLE SODIUM 40 MG PO TBEC
40.0000 mg | DELAYED_RELEASE_TABLET | Freq: Every day | ORAL | Status: DC
  Administered 2023-11-09 – 2023-11-25 (×17): 40 mg via ORAL
  Filled 2023-11-08 (×17): qty 1

## 2023-11-08 NOTE — Progress Notes (Addendum)
 There are no labs back yet this morning.  She is a little bit lethargic.  Looks like she does have some cirrhosis.  This could certainly be the etiology of the portal vein thrombus.  Workup is being done for the cirrhosis.  I was  I I I would think that NASH would be the likely etiology for the cirrhosis.  However, the full panel of studies are being done.  I wonder if she needs to have endoscopy to see if she has any varices.  This can slightly affect whether or not she gets anticoagulated.  I think that vitamin K was a good idea to try to help with her INR.  I am not surprised that she has E. coli growing out of her urine.  This was certainly account for the thrombocytopenia.  This could also account for the DIC like picture.  She also has STAPH epi in the blood.  This just might be contaminant.  I think she has significant synthetic liver difficulties given the low albumin in the elevated INR.  It will be interesting to see what her labs are today.  I have noted that her iron saturation was 90%.  The hemochromatosis workup is also a good idea.  For right now, not much for us  to do.  Will have to wait for the labs to come back today.  Again I would still hold off on anticoagulation depending on what her labs look like anticoagulation depending on what her labs look like.  She is awful nice.  Looks like her son may have had a incident that led him to go to the ER yesterday.  We will have to pray about this.     Charmayne Cooper, MD  John 15:13

## 2023-11-08 NOTE — Plan of Care (Signed)
 FMTS Interim Progress Note  Spoke with Dr. Irene Mannheim with nephrology regarding persistently elevated phosphate on heparinized sample. Given other differential diagnoses per Dr. Vincent Greek note and patient continues to be asymptomatic, no intervention needed at this time. Await further lab workup as previously noted.  Edison Gore, MD 11/08/2023, 9:41 PM PGY-2, [facility] Service pager [phone]

## 2023-11-08 NOTE — Assessment & Plan Note (Signed)
 DMII: awaiting A1C, continue CBG checks  HTN: holding medications ISO of hypotension HLD: will hold statin Hypothyroidism: TSH elevated, but T4 normal. Continue home Synthroid 

## 2023-11-08 NOTE — Assessment & Plan Note (Signed)
-   holding DVT ppx in setting of possible DIC? Though will consider anticoagulation today based on GI recs and Heme/Onc

## 2023-11-08 NOTE — Assessment & Plan Note (Addendum)
 ECHO hyperdynamic. Likely flow murmur.

## 2023-11-08 NOTE — Plan of Care (Signed)
 Discussed severely elevated phosphate x 2 with Dr.Coladonato, nephrology.  He agrees it is unusual in the setting of normal renal function, but spurious hyper acidemia can be seen in the setting of hyperbilirubinemia, gammaglobulin disorders. He recommends checking heparinized Phos level to reveal spurious hyperphosphatemia as as other causes of elevated phosphate seem unlikely, and patient clinically does not have altered mental status or other new findings to suggest hyperphosphatemia.  Discussed elevated hyperphosphatemia with Dr. Rosaline Coma hematology and oncology.  He feels it would be unlikely to be elevated to greater than 30 just secondary to patient's remote history of monoclonal gammopathy.  He recommends rechecking Phos from a regular blood stick and make sure did not use patient's PICC line.  Plan: - Repeat Phos with regular stick - Follow-up PTH, intact and ionized calcium  - Follow-up uric acid, protein electrophoresis, vitamin D , calcitriol 

## 2023-11-08 NOTE — Assessment & Plan Note (Signed)
 Awaiting urine sensitivities. Sepsis resolved. - continue broad spectrum antibiotic coverage as above. - follow up blood, urine cultures - acetaminophen  650 mg q6h PRN

## 2023-11-08 NOTE — Progress Notes (Signed)
 Daily Progress Note Intern Pager: [phone]  Patient name: Brianna Wheeler Medical record number: [id number] Date of birth: 1956-05-05 Age: 68 y.o. Gender: female  Primary Care Provider: Kandis Ormond, DO Consultants: IR, Heme-Onc, GI Code Status: Full  Pt Overview and Major Events to Date:  -4/16 Admitted -4/17 Lumbar puncture  Assessment and Plan:  Brianna Wheeler is 68 y.o. female admitted for acute encephalopathy secondary urosepsis and hepatic encephalapathy. Pertinent PMH/PSH includes HTN, Hypothyroidism, HLD, osteoarthritis, GERD.  Assessment & Plan Acute metabolic encephalopathy Mental status improving with lactulose  and empiric antibiotics. Initial LP labs negative. RUQ US  concerning for cirrhosis.  - GI consulted, appreciate recs   -Working up cirrhosis - Dced meningitis coverage, continue Ceftriaxone  and Flagyl  - stop fluids - continue lactulose  TID - urine sensitivities pending - AM CBC, CMP - continue fall and seizure precautions  Thrombosis - holding DVT ppx in setting of possible DIC? Though will consider anticoagulation today based on GI recs and Heme/Onc  Hyperbilirubinemia with Jaundice RUQ concerning for cirrhosis likely secondary to NASH. Indirect hyperbilirubinemia, likely, secondary to sepsis. Unlikely to be caused by portal vein thrombosis. - GI consulted, appreciate recs  -Evaluating causes of cirrhosis - AM hepatic function panel   UTI (urinary tract infection)  Sepsis Awaiting urine sensitivities. Sepsis resolved. - continue broad spectrum antibiotic coverage as above. - follow up blood, urine cultures - acetaminophen  650 mg q6h PRN Systolic murmur ECHO hyperdynamic. Likely flow murmur. Chronic health problem DMII: awaiting A1C, continue CBG checks  HTN: holding medications ISO of hypotension HLD: will hold statin Hypothyroidism: TSH elevated, but T4 normal. Continue home Synthroid   Hyperphosphatemia Severely elevated phosphate  greater than 30 per lab.  This is almost certainly an error or spuriously elevated, given her mental status is greatly improved today. - Repeat Phos - PTH, intact and free calcium    FEN/GI: Regular diet PPx: SCDs, hold 2/2 platelets Dispo: Pending clinical improvement  Subjective:  No acute events overnight.  Patient feels like she is doing better.  Does not have dysuria.  Objective: Temp:  [98 F (36.7 C)-99.1 F (37.3 C)] 98.4 F (36.9 C) (04/18 0651) Pulse Rate:  [73-84] 76 (04/18 0651) Resp:  [13-18] 17 (04/18 0651) BP: (95-130)/(47-79) 122/52 (04/18 0651) SpO2:  [95 %-100 %] 98 % (04/18 0651) Physical Exam: General: NAD uncomfortably in hospital bed, decreased jaundice Neuro: A&O x 3 Cardiovascular: Systolic murmur, regular rate and rhythm, 2+ pitting edema lower extremities Respiratory: normal WOB on room air, CTAB, no wheezes, ronchi or rales Abdomen: soft, NTTP, no rebound or guarding, pink urine present in Foley bag Extremities: Moving all 4 extremities equally   Laboratory: Most recent CBC Lab Results  Component Value Date   WBC 7.8 11/07/2023   HGB 9.5 (L) 11/07/2023   HCT 28.5 (L) 11/07/2023   MCV 102.2 (H) 11/07/2023   PLT 61 (L) 11/07/2023   Most recent BMP    Latest Ref Rng & Units 11/07/2023    5:11 AM  BMP  Glucose 70 - 99 mg/dL 284   BUN 8 - 23 mg/dL 12   Creatinine 1.32 - 1.00 mg/dL 4.40   Sodium 102 - 725 mmol/L 135   Potassium 3.5 - 5.1 mmol/L 3.4   Chloride 98 - 111 mmol/L 108   CO2 22 - 32 mmol/L 24   Calcium  8.9 - 10.3 mg/dL 7.7     INR 2.2 Phosphorus greater than 30 LDH 173  Imaging/Diagnostic Tests: RUQ US  1. Suspect portal  vein thrombus which is nearly occlusive with some slow flow detected in the portal vein by duplex ultrasound. Low portal vein velocities suggest component of underlying portal hypertension. 2. Suspect underlying cirrhosis. 3. Otherwise limited study with lack of visualization of multiple veins and hepatic  artery as above. 4. No ascites detected.  Ivin Marrow, MD 11/08/2023, 7:56 AM  PGY-2, [facility] FPTS Intern pager: [phone], text pages welcome Secure chat group [facility] Teaching Service

## 2023-11-08 NOTE — Progress Notes (Addendum)
 Daily Progress Note  DOA: 11/06/2023 Hospital Day: 3   Chief Complaint:  new diagnosis of cirrhosis with decompensation   Attending physician's note   I personally saw the patient and performed a substantive portion of this encounter (>50% time spent), including a complete performance of at least one of the key components (MDM, Hx and/or Exam), in conjunction with the APP.  I agree with the APP's note, impression, and recommendations with additional input as follows.    68 year old female admitted with sepsis, E. coli UTI and altered mental status, new diagnosis of cirrhosis and portal vein thrombus  MELD 3.0: 25 at 11/08/2023  6:20 AM MELD-Na: 22 at 11/08/2023  6:20 AM  No ascites or decompensation Continue supportive care  Will tentatively plan for EGD early next week, will check back on patient on Monday  The patient was provided an opportunity to ask questions and all were answered. The patient agreed with the plan and demonstrated an understanding of the instructions.   Lorena Rolling , MD [phone]     ASSESSMENT    68 y.o. year old female with a medical history including but not limited to hypertension, hypothyroidism, hyperlipidemia, osteoarthritis, GERD, asthma, cholelithiasis. Admitted with AMS and sepsis. See GI consult from 4/17 for abnormal LFTs and portal vein thrombosis.     Sepsis, workup in progress.  CT scan chest abdomen pelvis unrevealing Urine culture is positive for E. Coli Blood cultures positive for staph but could be contamination S/p lumbar puncture Suspected to have cirrhosis but SBP not suspected in absence of ascites  She is getting Rocephin  for UTI and flagyl  for enteric coverage pending completion of sepsis workup   New diagnosis of cirrhosis with decompensation as evidenced by jaundice, encephalopathy (probably HE), thrombocytopenia  and coagulopathy. No ascites. Etiology of cirrhosis not yet know but probably MASH given morbid  obesity but laboratory studies in progress.  MELD 3.0: 25 at 11/07/2023  5:11 AM Decompensation could be 2/2 to infectious process ( UTI) Hepatic encephalopathy has resolved.  No improvement in INR after Vit K 10 mg PO last evening. Got another dose this am.  ? DIC with decreased fibrinogen , decreased platelets, and elevated d-dimer, though smear was largely normal.  Hematology following.    Portal vein thrombosis.  Chronic? Hematology has evaluated and recommends holding on anticoagulation for PVT for now.   Macrocytosis /  new anemia Hgb 13.4 in Aug 2023. Presented this admission with hgb of 12.1, now down to 9.5.  Decline in hgb likely dilutional in part but also has folate deficiency.  She does report dark (but not melenic stools) stools at home. No evidence for iron deficiency by labs. Elevated iron saturation of 90% but normal ferritin. Hemochromatosis a consideration?    See PMH for additional history   PLAN   --Continue folate supplement.  --Continue Lactulose  20 grams TID . Titrate to 3-4 BMs daily.  --Repeat INR in am.  --EGD for varices screening prior to discharge , especially since she could need anticoagulation at some point for PVT (Hematology not recommending it for now).  --Will see again on Monday  Subjective   1 BM so far this am. No specific complaints Son in room.   Objective    Recent Labs    11/07/23 0511 11/07/23 0847 11/08/23 0620  WBC 8.9 7.8 4.9  HGB 9.6* 9.5* 9.5*  HCT 28.3* 28.5* 28.1*  MCV 102.5* 102.2* 102.2*  PLT 55* 61* 52*   Recent Labs  11/07/23 1130  FOLATE 3.0*  VITAMINB12 596  FERRITIN 194  TIBC 158*  IRONPCTSAT 90*   Recent Labs    11/06/23 0442 11/07/23 0511 11/08/23 0620  NA 134* 135 134*  K 3.9 3.4* 3.3*  CL 106 108 105  CO2 20* 24 24  GLUCOSE 75 107* 89  BUN 9 12 12   CREATININE 1.07* 0.95 0.84  CALCIUM  8.2* 7.7* 7.8*   Recent Labs    11/06/23 0442 11/07/23 0511 11/08/23 0620  PROT 6.9  7.0 5.5* 5.6*   ALBUMIN 1.9*  2.0* 1.5* 1.5*  AST 73*  71* 130* 143*  ALT 32  27 33 39  ALKPHOS 72  65 52 55  BILITOT 6.5*  6.8* 4.0* 3.4*  BILIDIR 2.3*  --   --   IBILI 4.2*  --   --    Recent Labs    11/08/23 0620  INR 2.2*    Imaging:  DG FL GUIDED LUMBAR PUNCTURE CLINICAL DATA:  68 year old female with altered mental status. IR was requested for lumbar puncture.  EXAM: LUMBAR PUNCTURE UNDER FLUOROSCOPY  PROCEDURE: An appropriate skin entry site was determined fluoroscopically. Operator donned sterile gloves and mask. Skin site was marked, then prepped with Betadine, draped in usual sterile fashion, and infiltrated locally with 1% lidocaine . A 20 gauge spinal needle advanced into the thecal sac at L5-S1 from a left interlaminar approach.  Clear colorless CSF spontaneously returned, with opening pressure of 15 cm water. 6.5 ml CSF were collected and divided among 4 sterile vials for the requested laboratory studies.  The needle was then removed. The patient tolerated the procedure well and there were no complications.  FLUOROSCOPY: Radiation Exposure Index (as provided by the fluoroscopic device): 32.30 mGy Kerma  IMPRESSION: Technically successful lumbar puncture under fluoroscopy.  This exam was performed by Lambert Pillion, PA-C, and was supervised and interpreted by Dr. Erica Hau, MD.  Electronically Signed   By: Erica Hau M.D.   On: 11/07/2023 11:50 US  ABDOMEN LIMITED WITH LIVER DOPPLER CLINICAL DATA:  Abnormal liver function tests and high ammonia level.  EXAM: DUPLEX ULTRASOUND OF LIVER  TECHNIQUE: Color and duplex Doppler ultrasound was performed to evaluate the hepatic in-flow and out-flow vessels.  COMPARISON:  None Available.  FINDINGS: Liver: Heterogeneous liver parenchyma. There may be a component of underlying cirrhosis. No focal lesion, mass or intrahepatic biliary ductal dilatation identified by ultrasound.  Main Portal Vein size:  1.9 cm  Portal Vein Velocities  Main Prox:  14 cm/sec  Main Mid: 13 cm/sec  Main Dist:  17 cm/sec Right: Not visualized  Left: Not visualized  Hepatic Vein Velocities  Right:  24 cm/sec  Middle:  Not visualized  Left:  Not visualized  IVC: Present and patent with normal respiratory phasicity.  Hepatic Artery Velocity:  Not visualized  Splenic Vein Velocity:  26 cm/sec  Spleen: 14.2 cm x 5.8 cm x 5.7 cm with a total volume of 243 cm^3 (411 cm^3 is upper limit normal)  Portal Vein Occlusion/Thrombus: Visualization of the main portal vein is suboptimal but there does appear to be probable nearly occlusive thrombus in the visualized portal vein with some flow detected.  Splenic Vein Occlusion/Thrombus: No  Ascites: None  Varices: None  Low portal vein velocities may be related to the nonocclusive thrombus but a component of portal hypertension is suspected.  IMPRESSION: 1. Suspect portal vein thrombus which is nearly occlusive with some slow flow detected in the portal vein by duplex ultrasound. Low portal  vein velocities suggest component of underlying portal hypertension. 2. Suspect underlying cirrhosis. 3. Otherwise limited study with lack of visualization of multiple veins and hepatic artery as above. 4. No ascites detected.  Electronically Signed   By: Erica Hau M.D.   On: 11/07/2023 08:30     Scheduled inpatient medications:   Chlorhexidine  Gluconate Cloth  6 each Topical Daily   folic acid   1 mg Oral Daily   lactulose   20 g Oral TID   levothyroxine   125 mcg Oral Q0600   phytonadione   10 mg Oral Daily   sodium chloride  flush  10-40 mL Intracatheter Q12H   Continuous inpatient infusions:   cefTRIAXone  (ROCEPHIN )  IV     metronidazole  500 mg (11/08/23 0940)   PRN inpatient medications: acetaminophen  **OR** acetaminophen , sodium chloride  flush  Vital signs in last 24 hours: Temp:  [98 F (36.7 C)-99.1 F (37.3 C)] 98.6 F (37 C)  (04/18 0900) Pulse Rate:  [70-84] 70 (04/18 0900) Resp:  [13-18] 15 (04/18 0900) BP: (97-130)/(47-69) 121/58 (04/18 0900) SpO2:  [95 %-99 %] 96 % (04/18 0900)    Intake/Output Summary (Last 24 hours) at 11/08/2023 0953 Last data filed at 11/08/2023 0600 Gross per 24 hour  Intake 2733.33 ml  Output 1500 ml  Net 1233.33 ml    Intake/Output from previous day: 04/17 0701 - 04/18 0700 In: 2733.3 [P.O.:720; I.V.:561.7; IV Piggyback:1451.7] Out: 1500 [Urine:1500] Intake/Output this shift: No intake/output data recorded.   Physical Exam:  General: Alert female in NAD Heart:  Regular rate and rhythm.  Pulmonary: Normal respiratory effort Abdomen: Soft, nondistended, nontender. Normal bowel sounds. Extremities: Bilateral lower extremities with pitting edema  Neurologic: Alert and oriented, mild asterixis Psych: Pleasant. Cooperative    LOS: 2 days   Mai Schwalbe ,NP 11/08/2023, 9:53 AM

## 2023-11-08 NOTE — Assessment & Plan Note (Signed)
 Severely elevated phosphate greater than 30 per lab.  This is almost certainly an error or spuriously elevated, given her mental status is greatly improved today. - Repeat Phos - PTH, intact and free calcium 

## 2023-11-08 NOTE — Assessment & Plan Note (Signed)
 Mental status improving with lactulose  and empiric antibiotics. Initial LP labs negative. RUQ US  concerning for cirrhosis.  - GI consulted, appreciate recs   -Working up cirrhosis - Dced meningitis coverage, continue Ceftriaxone  and Flagyl  - stop fluids - continue lactulose  TID - urine sensitivities pending - AM CBC, CMP - continue fall and seizure precautions

## 2023-11-08 NOTE — Assessment & Plan Note (Signed)
>>  ASSESSMENT AND PLAN FOR PORTAL VENOUS THROMBUS WRITTEN ON 11/08/2023  8:11 AM BY Ivin Marrow, MD  - holding DVT ppx in setting of possible DIC? Though will consider anticoagulation today based on GI recs and Heme/Onc

## 2023-11-08 NOTE — Assessment & Plan Note (Deleted)
 Bilateral DVT US  negative. Swelling likely ISO of anasarca d/t hepatic dysfunction. Brianna Wheeler

## 2023-11-08 NOTE — Assessment & Plan Note (Addendum)
 RUQ concerning for cirrhosis likely secondary to NASH. Indirect hyperbilirubinemia, likely, secondary to sepsis. Unlikely to be caused by portal vein thrombosis. - GI consulted, appreciate recs  -Evaluating causes of cirrhosis - AM hepatic function panel

## 2023-11-09 DIAGNOSIS — N390 Urinary tract infection, site not specified: Secondary | ICD-10-CM

## 2023-11-09 DIAGNOSIS — I81 Portal vein thrombosis: Secondary | ICD-10-CM | POA: Diagnosis not present

## 2023-11-09 DIAGNOSIS — D696 Thrombocytopenia, unspecified: Secondary | ICD-10-CM | POA: Diagnosis not present

## 2023-11-09 DIAGNOSIS — G9341 Metabolic encephalopathy: Secondary | ICD-10-CM | POA: Diagnosis not present

## 2023-11-09 DIAGNOSIS — B9620 Unspecified Escherichia coli [E. coli] as the cause of diseases classified elsewhere: Secondary | ICD-10-CM

## 2023-11-09 LAB — COMPREHENSIVE METABOLIC PANEL WITH GFR
ALT: 38 U/L (ref 0–44)
AST: 121 U/L — ABNORMAL HIGH (ref 15–41)
Albumin: <1.5 g/dL — ABNORMAL LOW (ref 3.5–5.0)
Alkaline Phosphatase: 53 U/L (ref 38–126)
Anion gap: 3 — ABNORMAL LOW (ref 5–15)
BUN: 11 mg/dL (ref 8–23)
CO2: 23 mmol/L (ref 22–32)
Calcium: 7.6 mg/dL — ABNORMAL LOW (ref 8.9–10.3)
Chloride: 108 mmol/L (ref 98–111)
Creatinine, Ser: 0.77 mg/dL (ref 0.44–1.00)
GFR, Estimated: >60 mL/min (ref >60)
Glucose, Bld: 76 mg/dL (ref 70–99)
Potassium: 3.5 mmol/L (ref 3.5–5.1)
Sodium: 134 mmol/L — ABNORMAL LOW (ref 135–145)
Total Bilirubin: 2.9 mg/dL — ABNORMAL HIGH (ref 0.0–1.2)
Total Protein: 5.2 g/dL — ABNORMAL LOW (ref 6.5–8.1)

## 2023-11-09 LAB — FIBRINOGEN: Fibrinogen: 138 mg/dL — ABNORMAL LOW (ref 210–475)

## 2023-11-09 LAB — CBC
HCT: 28.2 % — ABNORMAL LOW (ref 36.0–46.0)
Hemoglobin: 9.5 g/dL — ABNORMAL LOW (ref 12.0–15.0)
MCH: 34.8 pg — ABNORMAL HIGH (ref 26.0–34.0)
MCHC: 33.7 g/dL (ref 30.0–36.0)
MCV: 103.3 fL — ABNORMAL HIGH (ref 80.0–100.0)
Platelets: 42 10*3/uL — ABNORMAL LOW (ref 150–400)
RBC: 2.73 MIL/uL — ABNORMAL LOW (ref 3.87–5.11)
RDW: 17.3 % — ABNORMAL HIGH (ref 11.5–15.5)
WBC: 3.3 10*3/uL — ABNORMAL LOW (ref 4.0–10.5)
nRBC: 0 % (ref 0.0–0.2)

## 2023-11-09 LAB — RETICULOCYTES
Immature Retic Fract: 22.6 % — ABNORMAL HIGH (ref 2.3–15.9)
RBC.: 2.62 MIL/uL — ABNORMAL LOW (ref 3.87–5.11)
Retic Count, Absolute: 75.5 10*3/uL (ref 19.0–186.0)
Retic Ct Pct: 2.9 % (ref 0.4–3.1)

## 2023-11-09 LAB — LACTATE DEHYDROGENASE: LDH: 159 U/L (ref 98–192)

## 2023-11-09 LAB — MAGNESIUM: Magnesium: 1.6 mg/dL — ABNORMAL LOW (ref 1.7–2.4)

## 2023-11-09 LAB — GLUCOSE, CAPILLARY
Glucose-Capillary: 71 mg/dL (ref 70–99)
Glucose-Capillary: 72 mg/dL (ref 70–99)
Glucose-Capillary: 86 mg/dL (ref 70–99)
Glucose-Capillary: 94 mg/dL (ref 70–99)

## 2023-11-09 LAB — PHOSPHORUS: Phosphorus: >30 mg/dL — ABNORMAL HIGH (ref 2.5–4.6)

## 2023-11-09 MED ORDER — FOLIC ACID 1 MG PO TABS
2.0000 mg | ORAL_TABLET | Freq: Every day | ORAL | Status: DC
  Administered 2023-11-09 – 2023-11-25 (×17): 2 mg via ORAL
  Filled 2023-11-09 (×17): qty 2

## 2023-11-09 MED ORDER — LACTULOSE 10 GM/15ML PO SOLN
20.0000 g | Freq: Two times a day (BID) | ORAL | Status: DC
  Administered 2023-11-09 – 2023-11-18 (×15): 20 g via ORAL
  Filled 2023-11-09 (×17): qty 30

## 2023-11-09 MED ORDER — POTASSIUM CHLORIDE CRYS ER 20 MEQ PO TBCR
40.0000 meq | EXTENDED_RELEASE_TABLET | Freq: Once | ORAL | Status: AC
  Administered 2023-11-09: 40 meq via ORAL
  Filled 2023-11-09: qty 2

## 2023-11-09 MED ORDER — MAGNESIUM SULFATE 2 GM/50ML IV SOLN
2.0000 g | Freq: Once | INTRAVENOUS | Status: AC
  Administered 2023-11-09: 2 g via INTRAVENOUS
  Filled 2023-11-09: qty 50

## 2023-11-09 MED ORDER — TRAMADOL HCL 50 MG PO TABS
50.0000 mg | ORAL_TABLET | Freq: Two times a day (BID) | ORAL | Status: DC | PRN
  Administered 2023-11-09 – 2023-11-15 (×7): 50 mg via ORAL
  Filled 2023-11-09 (×7): qty 1

## 2023-11-09 NOTE — Assessment & Plan Note (Addendum)
 RUQ with suspected portal vein thrombus which is nearly occlusive, low portal vein velocities suggest underlying portal HTN. LDH wnl. Retic wnl. Fibrinogen  not elevated. Ferritin wnl. TIBC decreased.  - Hem/Onc, GI on board - greatly appreciate their recs as below  - Holding DVT ppx per Hem Onc - hemochromatosis labs pending  - Haptoglobin pending  - Kappa/lambda pending  - Repeat multiple myeloma panel pending  - UPEP 24 hr pending  - Ceruloplasmin, IgG pending  - ANA, anti-smooth muscle, mitochondrial antibody pending  - Likely EGD for varices per GI some time prior to discharge  - AM CBC, fibrinogen , LDH, Retic

## 2023-11-09 NOTE — Plan of Care (Signed)
  Problem: Education: Goal: Knowledge of General Education information will improve Description: Including pain rating scale, medication(s)/side effects and non-pharmacologic comfort measures Outcome: Progressing   Problem: Coping: Goal: Level of anxiety will decrease Outcome: Progressing   Problem: Elimination: Goal: Will not experience complications related to urinary retention Outcome: Progressing   Problem: Pain Managment: Goal: General experience of comfort will improve and/or be controlled Outcome: Progressing

## 2023-11-09 NOTE — Assessment & Plan Note (Deleted)
 TSH elevated. Will order T4 to follow up - T4 pending - Cont levothyroxine 

## 2023-11-09 NOTE — Assessment & Plan Note (Signed)
>>  ASSESSMENT AND PLAN FOR PORTAL VENOUS THROMBUS WRITTEN ON 11/09/2023  2:12 PM BY Carey Chapman, MD  RUQ with suspected portal vein thrombus which is nearly occlusive, low portal vein velocities suggest underlying portal HTN. LDH wnl. Retic wnl. Fibrinogen  not elevated. Ferritin wnl. TIBC decreased.  - Hem/Onc, GI on board - greatly appreciate their recs as below  - Holding DVT ppx per Hem Onc - hemochromatosis labs pending  - Haptoglobin pending  - Kappa/lambda pending  - Repeat multiple myeloma panel pending  - UPEP 24 hr pending  - Ceruloplasmin, IgG pending  - ANA, anti-smooth muscle, mitochondrial antibody pending  - Likely EGD for varices per GI some time prior to discharge  - AM CBC, fibrinogen , LDH, Retic

## 2023-11-09 NOTE — Assessment & Plan Note (Addendum)
 Severely elevated phosphate greater than 30 upon redraw.  Suspect erroneous result/collection given improved mental status.  - PTH, intact and free calcium  pending

## 2023-11-09 NOTE — Progress Notes (Signed)
 There are no labs back yet this morning.  She does seem to be a lot more alert.  It was nice talking with her this morning.  She has a E. coli UTI.  I suspect that the Staph epi in the blood is probably a contaminant.  There is some cirrhosis.  She is going to have a endoscopy to look for varices.  Yesterday, her platelet count was 52,000.  Now is certainly possible that she just may not be able to have her platelet count if she does have underlying cirrhosis.  She has a very low vitamin D  level.  We are still awaiting the results from hemochromatosis.  Her folate was very low.  She is on folic acid .  I noted that she had a M spike of 2.6 g/dL back in August 5638.  I would probably need to repeat this.  Again, I would think that a plasma cell process would be a little unusual but yet cannot be totally discounted.  I probably would do a 24-hour urine on her also.  I still would not anticoagulate for this portal vein thrombus.  I would really like to see her platelet count be higher, if possible.  Also we need to make sure that she does not have varices that can certainly bleed with anticoagulation.  At least, her mental state is much better.  I know she is getting incredible care from everybody in the ICU.  I know the staff are so compassionate.   Charmayne Cooper, MD  John 16:20

## 2023-11-09 NOTE — Assessment & Plan Note (Addendum)
 Mental status improving with lactulose  and empiric antibiotics. CSF studies unremarkable. RUQ US  concerning for cirrhosis. Blood cx Ng3d.  - Cont CTX  - Cont cirrhosis workup per GI as below  - Decrease lactulose  to BID for goal 3-4 BM/day  - continue fall and seizure precautions  - AM CBC, CMP

## 2023-11-09 NOTE — Assessment & Plan Note (Signed)
 ECHO hyperdynamic. Likely flow murmur.

## 2023-11-09 NOTE — Progress Notes (Signed)
 Daily Progress Note Intern Pager: [phone]  Patient name: Brianna Wheeler Medical record number: [id number] Date of birth: [birth year] Age: 68 y.o. Gender: female  Primary Care Provider: Kandis Ormond, DO Consultants: IR, Hem/Onc, GI Code Status: Full  Pt Overview and Major Events to Date:  4/16: Admitted 4/17: Lumbar puncture  Assessment and Plan:  Brianna Wheeler 68 y.o. F admitted for acute encephalopathy secondary to urosepsis and hepatic encephalopathy. PMH includes  Assessment & Plan Acute metabolic encephalopathy Mental status improving with lactulose  and empiric antibiotics. CSF studies unremarkable. RUQ US  concerning for cirrhosis. Blood cx Ng3d.  - Cont CTX  - Cont cirrhosis workup per GI as below  - Decrease lactulose  to BID for goal 3-4 BM/day  - continue fall and seizure precautions  - AM CBC, CMP Portal venous thrombus RUQ with suspected portal vein thrombus which is nearly occlusive, low portal vein velocities suggest underlying portal HTN. LDH wnl. Retic wnl. Fibrinogen  not elevated. Ferritin wnl. TIBC decreased.  - Hem/Onc, GI on board - greatly appreciate their recs as below  - Holding DVT ppx per Hem Onc - hemochromatosis labs pending  - Haptoglobin pending  - Kappa/lambda pending  - Repeat multiple myeloma panel pending  - UPEP 24 hr pending  - Ceruloplasmin, IgG pending  - ANA, anti-smooth muscle, mitochondrial antibody pending  - Likely EGD for varices per GI some time prior to discharge  - AM CBC, fibrinogen , LDH, Retic  E. coli UTI (urinary tract infection) Pan sensitive Ecoli. Sepsis resolved. Blood cx Ng3d.  - Cont CTX - follow up blood cultures - tylenol  650 mg q6h PRN Hyperbilirubinemia with Jaundice RUQ concerning for cirrhosis likely secondary to NASH. Indirect hyperbilirubinemia, likely, secondary to sepsis. Total bilirubin continues to trend down.  - Appreciate ongoing GI recs  - AM CMP, Mg, Phos Systolic murmur ECHO  hyperdynamic. Likely flow murmur. Hyperphosphatemia Severely elevated phosphate greater than 30 upon redraw.  Suspect erroneous result/collection given improved mental status.  - PTH, intact and free calcium  pending  Chronic health problem DMII: A1C 4.3, continue CBG checks  HTN: holding medications ISO of hypotension HLD: will hold statin Hypothyroidism: TSH elevated, but T4 normal. Continue home Synthroid     FEN/GI: Regular diet PPx: SCDs Dispo: SNF pending clinical improvement   Subjective:  Patient reports doing better today. Had 5-6 stools yesterday. Eating and drinking well.   Objective: Temp:  [98.2 F (36.8 C)-98.8 F (37.1 C)] 98.4 F (36.9 C) (04/19 0600) Pulse Rate:  [68-77] 74 (04/19 0600) Resp:  [14-21] 21 (04/19 0600) BP: (111-124)/(52-60) 113/56 (04/19 0400) SpO2:  [94 %-99 %] 96 % (04/19 0600) Physical Exam: General: No acute distress. Resting comfortably in room. CV: S1/S2. No extra heart sounds. Warm and well-perfused. Pulm: Breathing comfortably on room air. CTAB anteriorly. No increased WOB. Abd: Normoactive BS. Soft, non-tender, non-distended. Skin/Ext:  Warm, dry. Chronic LE edema.  Neuro: Alert and oriented x4. Facial sensation intact bilaterally. Facial expression symmetric. Tongue midline. 5/5 strength of upper and lower extremities.   Laboratory: Most recent CBC Lab Results  Component Value Date   WBC 3.3 (L) 11/09/2023   HGB 9.5 (L) 11/09/2023   HCT 28.2 (L) 11/09/2023   MCV 103.3 (H) 11/09/2023   PLT 42 (L) 11/09/2023      Latest Ref Rng & Units 11/09/2023    5:52 AM 11/08/2023    6:20 AM 11/07/2023    5:11 AM  CMP  Glucose 70 - 99 mg/dL 76  89  107   BUN 8 - 23 mg/dL 11  12  12    Creatinine 0.44 - 1.00 mg/dL 1.61  0.96  0.45   Sodium 135 - 145 mmol/L 134  134  135   Potassium 3.5 - 5.1 mmol/L 3.5  3.3  3.4   Chloride 98 - 111 mmol/L 108  105  108   CO2 22 - 32 mmol/L 23  24  24    Calcium  8.9 - 10.3 mg/dL 7.6  7.8  7.7   Total  Protein 6.5 - 8.1 g/dL 5.2  5.6  5.5   Total Bilirubin 0.0 - 1.2 mg/dL 2.9  3.4  4.0   Alkaline Phos 38 - 126 U/L 53  55  52   AST 15 - 41 U/L 121  143  130   ALT 0 - 44 U/L 38  39  33      Phos >30 Mg 1.6 LDH 159 Fibrinogen  138  Brianna Chapman, MD 11/09/2023, 7:44 AM  PGY-1, [facility] FPTS Intern pager: [phone], text pages welcome Secure chat group [facility] Teaching Service

## 2023-11-09 NOTE — Assessment & Plan Note (Deleted)
 Bilateral DVT US  negative. Swelling likely ISO of anasarca d/t hepatic dysfunction. Aaron Aas

## 2023-11-09 NOTE — Assessment & Plan Note (Addendum)
 Pan sensitive Ecoli. Sepsis resolved. Blood cx Ng3d.  - Cont CTX - follow up blood cultures - tylenol  650 mg q6h PRN

## 2023-11-09 NOTE — Assessment & Plan Note (Addendum)
 DMII: A1C 4.3, continue CBG checks  HTN: holding medications ISO of hypotension HLD: will hold statin Hypothyroidism: TSH elevated, but T4 normal. Continue home Synthroid 

## 2023-11-09 NOTE — Assessment & Plan Note (Addendum)
 RUQ concerning for cirrhosis likely secondary to NASH. Indirect hyperbilirubinemia, likely, secondary to sepsis. Total bilirubin continues to trend down.  - Appreciate ongoing GI recs  - AM CMP, Mg, Phos

## 2023-11-10 DIAGNOSIS — N390 Urinary tract infection, site not specified: Secondary | ICD-10-CM | POA: Diagnosis not present

## 2023-11-10 DIAGNOSIS — I81 Portal vein thrombosis: Secondary | ICD-10-CM | POA: Diagnosis not present

## 2023-11-10 DIAGNOSIS — L89301 Pressure ulcer of unspecified buttock, stage 1: Secondary | ICD-10-CM | POA: Diagnosis not present

## 2023-11-10 DIAGNOSIS — R601 Generalized edema: Secondary | ICD-10-CM | POA: Diagnosis not present

## 2023-11-10 LAB — FIBRINOGEN: Fibrinogen: 195 mg/dL — ABNORMAL LOW (ref 210–475)

## 2023-11-10 LAB — RETICULOCYTES
Immature Retic Fract: 31.7 % — ABNORMAL HIGH (ref 2.3–15.9)
RBC.: 2.74 MIL/uL — ABNORMAL LOW (ref 3.87–5.11)
Retic Count, Absolute: 89.6 10*3/uL (ref 19.0–186.0)
Retic Ct Pct: 3.3 % — ABNORMAL HIGH (ref 0.4–3.1)

## 2023-11-10 LAB — GLUCOSE, CAPILLARY
Glucose-Capillary: 84 mg/dL (ref 70–99)
Glucose-Capillary: 89 mg/dL (ref 70–99)
Glucose-Capillary: 113 mg/dL — ABNORMAL HIGH (ref 70–99)
Glucose-Capillary: 90 mg/dL (ref 70–99)

## 2023-11-10 LAB — LACTATE DEHYDROGENASE: LDH: 159 U/L (ref 98–192)

## 2023-11-10 LAB — CERULOPLASMIN: Ceruloplasmin: 13.6 mg/dL — ABNORMAL LOW (ref 19.0–39.0)

## 2023-11-10 LAB — MAGNESIUM: Magnesium: 1.8 mg/dL (ref 1.7–2.4)

## 2023-11-10 LAB — COMPREHENSIVE METABOLIC PANEL WITH GFR
ALT: 39 U/L (ref 0–44)
AST: 118 U/L — ABNORMAL HIGH (ref 15–41)
Albumin: <1.5 g/dL — ABNORMAL LOW (ref 3.5–5.0)
Alkaline Phosphatase: 61 U/L (ref 38–126)
Anion gap: 4 — ABNORMAL LOW (ref 5–15)
BUN: 12 mg/dL (ref 8–23)
CO2: 25 mmol/L (ref 22–32)
Calcium: 7.7 mg/dL — ABNORMAL LOW (ref 8.9–10.3)
Chloride: 105 mmol/L (ref 98–111)
Creatinine, Ser: 0.76 mg/dL (ref 0.44–1.00)
GFR, Estimated: >60 mL/min (ref >60)
Glucose, Bld: 82 mg/dL (ref 70–99)
Potassium: 3.9 mmol/L (ref 3.5–5.1)
Sodium: 134 mmol/L — ABNORMAL LOW (ref 135–145)
Total Bilirubin: 2.4 mg/dL — ABNORMAL HIGH (ref 0.0–1.2)
Total Protein: 5.6 g/dL — ABNORMAL LOW (ref 6.5–8.1)

## 2023-11-10 LAB — HAPTOGLOBIN: Haptoglobin: <10 mg/dL — ABNORMAL LOW (ref 37–355)

## 2023-11-10 LAB — PHOSPHORUS: Phosphorus: >30 mg/dL — ABNORMAL HIGH (ref 2.5–4.6)

## 2023-11-10 LAB — CBC
HCT: 28.6 % — ABNORMAL LOW (ref 36.0–46.0)
Hemoglobin: 9.6 g/dL — ABNORMAL LOW (ref 12.0–15.0)
MCH: 34.9 pg — ABNORMAL HIGH (ref 26.0–34.0)
MCHC: 33.6 g/dL (ref 30.0–36.0)
MCV: 104 fL — ABNORMAL HIGH (ref 80.0–100.0)
Platelets: 50 10*3/uL — ABNORMAL LOW (ref 150–400)
RBC: 2.75 MIL/uL — ABNORMAL LOW (ref 3.87–5.11)
RDW: 17.4 % — ABNORMAL HIGH (ref 11.5–15.5)
WBC: 3.2 10*3/uL — ABNORMAL LOW (ref 4.0–10.5)
nRBC: 0 % (ref 0.0–0.2)

## 2023-11-10 LAB — PTH, INTACT AND CALCIUM
Calcium, Total (PTH): 7.5 mg/dL — ABNORMAL LOW (ref 8.7–10.3)
PTH: 47 pg/mL (ref 15–65)

## 2023-11-10 LAB — IGG: IgG (Immunoglobin G), Serum: 3446 mg/dL — ABNORMAL HIGH (ref 586–1602)

## 2023-11-10 MED ORDER — FUROSEMIDE 20 MG PO TABS
20.0000 mg | ORAL_TABLET | Freq: Once | ORAL | Status: AC
  Administered 2023-11-10: 20 mg via ORAL
  Filled 2023-11-10: qty 1

## 2023-11-10 MED ORDER — ONDANSETRON 4 MG PO TBDP
4.0000 mg | ORAL_TABLET | Freq: Three times a day (TID) | ORAL | Status: DC | PRN
  Administered 2023-11-10 – 2023-11-12 (×2): 4 mg via ORAL
  Filled 2023-11-10 (×4): qty 1

## 2023-11-10 MED ORDER — ORAL CARE MOUTH RINSE: 15.0000 mL | OROMUCOSAL | Status: DC | PRN

## 2023-11-10 MED ORDER — CEPHALEXIN 500 MG PO CAPS
500.0000 mg | ORAL_CAPSULE | Freq: Two times a day (BID) | ORAL | Status: AC
  Administered 2023-11-10 – 2023-11-12 (×6): 500 mg via ORAL
  Filled 2023-11-10 (×6): qty 1

## 2023-11-10 NOTE — Progress Notes (Signed)
     Daily Progress Note Intern Pager: [phone]  Patient name: Brianna Wheeler Medical record number: [id number] Date of birth: [birth year] Age: 68 y.o. Gender: female  Primary Care Provider: Kandis Ormond, DO Consultants: Heme/Onc, GI Code Status: Full  Pt Overview and Major Events to Date:  4/16: admitted 4/17: LP   Assessment and Plan:  Assessment & Plan Acute metabolic encephalopathy Patient is at baseline. - appreciate GI recs  - will discontinue ceftriaxone  - lactulose  to BID  - fall precautions - AM CBC, CMP - will treat anasarca with 20mg  PO lasix    Portal venous thrombus - Heme/Onc and GI following - Holding DVT ppx - pending multiple labs:  - hemochromatosis  - haptoglobin  - kappa/lambda  - MM labs  - UPEP 24 hr  - Ceruloplasmin, IgG  - ANA, Anti- SM, mitochrondrial antibody - EGD prior to discharge - AM CBC, CMP  E. coli UTI (urinary tract infection) - will transition to keflex  for total of 7 day course (end on 4/23)  - tylenol  650 mg q6h PRN Hyperphosphatemia Continues to be elevated. Given discussion with heme/onc and nephrology and they are not concerned for this as patients mentation is appropriate. Will stop following  Chronic health problem DMII: A1C 4.3, continue CBG checks  HTN: holding medications ISO of hypotension HLD: will hold statin Hypothyroidism: TSH elevated, but T4 normal. Continue home Synthroid   Pain: added home tramadol    FEN/GI: Regular PPx: SCDs Dispo: pending improvement  Subjective:  NAD, reports tramadol  being back on helps with pain  Objective: Temp:  [97.6 F (36.4 C)-99.1 F (37.3 C)] 97.9 F (36.6 C) (04/20 0825) Pulse Rate:  [68-74] 71 (04/20 0825) Resp:  [14-18] 18 (04/20 0727) BP: (108-138)/(51-70) 128/69 (04/20 0825) SpO2:  [95 %-100 %] 99 % (04/20 0825) Physical Exam: General: A&O, NAD, obese Cardiovascular: RRR, systolic murmur Respiratory: NWOB on RA Abdomen: obese, not TTP Extremities: 2+  pitting edema bilateral LE   Laboratory: Most recent CBC Lab Results  Component Value Date   WBC 3.2 (L) 11/10/2023   HGB 9.6 (L) 11/10/2023   HCT 28.6 (L) 11/10/2023   MCV 104.0 (H) 11/10/2023   PLT 50 (L) 11/10/2023   Most recent BMP    Latest Ref Rng & Units 11/10/2023    6:17 AM  BMP  Glucose 70 - 99 mg/dL 82   BUN 8 - 23 mg/dL 12   Creatinine 8.46 - 1.00 mg/dL 9.62   Sodium 952 - 841 mmol/L 134   Potassium 3.5 - 5.1 mmol/L 3.9   Chloride 98 - 111 mmol/L 105   CO2 22 - 32 mmol/L 25   Calcium  8.9 - 10.3 mg/dL 7.7     Imaging/Diagnostic Tests: No new imaging Farris Hong, MD 11/10/2023, 9:41 AM  PGY-1, [place] [facility] FPTS Intern pager: [phone], text pages welcome Secure chat group [facility] Teaching Service

## 2023-11-10 NOTE — Assessment & Plan Note (Addendum)
 DMII: A1C 4.3, continue CBG checks  HTN: holding medications ISO of hypotension HLD: will hold statin Hypothyroidism: TSH elevated, but T4 normal. Continue home Synthroid   Pain: added home tramadol 

## 2023-11-10 NOTE — Assessment & Plan Note (Deleted)
 Bilateral DVT US  negative. Swelling likely ISO of anasarca d/t hepatic dysfunction. Aaron Aas

## 2023-11-10 NOTE — Assessment & Plan Note (Addendum)
 Patient is at baseline. - appreciate GI recs  - will discontinue ceftriaxone  - lactulose  to BID  - fall precautions - AM CBC, CMP - will treat anasarca with 20mg  PO lasix 

## 2023-11-10 NOTE — Assessment & Plan Note (Signed)
>>  ASSESSMENT AND PLAN FOR PORTAL VENOUS THROMBUS WRITTEN ON 11/10/2023  1:08 PM BY BALOCH, MAHNOOR, MD  - Heme/Onc and GI following - Holding DVT ppx - pending multiple labs:  - hemochromatosis  - haptoglobin  - kappa/lambda  - MM labs  - UPEP 24 hr  - Ceruloplasmin, IgG  - ANA, Anti- SM, mitochrondrial antibody - EGD prior to discharge - AM CBC, CMP

## 2023-11-10 NOTE — Assessment & Plan Note (Addendum)
 Continues to be elevated. Given discussion with heme/onc and nephrology and they are not concerned for this as patients mentation is appropriate. Will stop following

## 2023-11-10 NOTE — Assessment & Plan Note (Deleted)
 RUQ concerning for cirrhosis likely secondary to NASH. Indirect hyperbilirubinemia, likely, secondary to sepsis. Total bilirubin continues to trend down.  - Appreciate ongoing GI recs  - AM CMP, Mg, Phos

## 2023-11-10 NOTE — Assessment & Plan Note (Addendum)
-   Heme/Onc and GI following - Holding DVT ppx - pending multiple labs:  - hemochromatosis  - haptoglobin  - kappa/lambda  - MM labs  - UPEP 24 hr  - Ceruloplasmin, IgG  - ANA, Anti- SM, mitochrondrial antibody - EGD prior to discharge - AM CBC, CMP

## 2023-11-10 NOTE — Assessment & Plan Note (Deleted)
 TSH elevated. Will order T4 to follow up - T4 pending - will restart home meds when able to PO

## 2023-11-10 NOTE — Assessment & Plan Note (Addendum)
-   will transition to keflex  for total of 7 day course (end on 4/23)  - tylenol  650 mg q6h PRN

## 2023-11-10 NOTE — Assessment & Plan Note (Deleted)
 ECHO hyperdynamic. Likely flow murmur.

## 2023-11-10 NOTE — Plan of Care (Signed)

## 2023-11-10 NOTE — Plan of Care (Signed)
  Problem: Health Behavior/Discharge Planning: Goal: Ability to manage health-related needs will improve Outcome: Progressing   Problem: Clinical Measurements: Goal: Will remain free from infection Outcome: Progressing Goal: Respiratory complications will improve Outcome: Progressing Goal: Cardiovascular complication will be avoided Outcome: Progressing   Problem: Pain Managment: Goal: General experience of comfort will improve and/or be controlled Outcome: Progressing   Problem: Safety: Goal: Ability to remain free from injury will improve Outcome: Progressing

## 2023-11-11 DIAGNOSIS — K7460 Unspecified cirrhosis of liver: Secondary | ICD-10-CM | POA: Diagnosis not present

## 2023-11-11 DIAGNOSIS — D649 Anemia, unspecified: Secondary | ICD-10-CM | POA: Diagnosis not present

## 2023-11-11 DIAGNOSIS — I81 Portal vein thrombosis: Secondary | ICD-10-CM | POA: Diagnosis not present

## 2023-11-11 DIAGNOSIS — A4151 Sepsis due to Escherichia coli [E. coli]: Secondary | ICD-10-CM | POA: Diagnosis not present

## 2023-11-11 DIAGNOSIS — E559 Vitamin D deficiency, unspecified: Secondary | ICD-10-CM | POA: Insufficient documentation

## 2023-11-11 DIAGNOSIS — K766 Portal hypertension: Secondary | ICD-10-CM | POA: Diagnosis not present

## 2023-11-11 DIAGNOSIS — N390 Urinary tract infection, site not specified: Secondary | ICD-10-CM | POA: Diagnosis not present

## 2023-11-11 LAB — CBC
HCT: 29.8 % — ABNORMAL LOW (ref 36.0–46.0)
Hemoglobin: 9.7 g/dL — ABNORMAL LOW (ref 12.0–15.0)
MCH: 34.6 pg — ABNORMAL HIGH (ref 26.0–34.0)
MCHC: 32.6 g/dL (ref 30.0–36.0)
MCV: 106.4 fL — ABNORMAL HIGH (ref 80.0–100.0)
Platelets: 45 10*3/uL — ABNORMAL LOW (ref 150–400)
RBC: 2.8 MIL/uL — ABNORMAL LOW (ref 3.87–5.11)
RDW: 18.2 % — ABNORMAL HIGH (ref 11.5–15.5)
WBC: 3.5 10*3/uL — ABNORMAL LOW (ref 4.0–10.5)
nRBC: 0 % (ref 0.0–0.2)

## 2023-11-11 LAB — CK: Total CK: 112 U/L (ref 38–234)

## 2023-11-11 LAB — KAPPA/LAMBDA LIGHT CHAINS
Kappa free light chain: 16.6 mg/L (ref 3.3–19.4)
Kappa, lambda light chain ratio: 0.15 — ABNORMAL LOW (ref 0.26–1.65)
Lambda free light chains: 110.8 mg/L — ABNORMAL HIGH (ref 5.7–26.3)

## 2023-11-11 LAB — COMPREHENSIVE METABOLIC PANEL WITH GFR
ALT: 38 U/L (ref 0–44)
AST: 109 U/L — ABNORMAL HIGH (ref 15–41)
Albumin: <1.5 g/dL — ABNORMAL LOW (ref 3.5–5.0)
Alkaline Phosphatase: 60 U/L (ref 38–126)
Anion gap: 4 — ABNORMAL LOW (ref 5–15)
BUN: 13 mg/dL (ref 8–23)
CO2: 23 mmol/L (ref 22–32)
Calcium: 7.5 mg/dL — ABNORMAL LOW (ref 8.9–10.3)
Chloride: 107 mmol/L (ref 98–111)
Creatinine, Ser: 0.87 mg/dL (ref 0.44–1.00)
GFR, Estimated: >60 mL/min (ref >60)
Glucose, Bld: 84 mg/dL (ref 70–99)
Potassium: 3.9 mmol/L (ref 3.5–5.1)
Sodium: 134 mmol/L — ABNORMAL LOW (ref 135–145)
Total Bilirubin: 2.6 mg/dL — ABNORMAL HIGH (ref 0.0–1.2)
Total Protein: 5.2 g/dL — ABNORMAL LOW (ref 6.5–8.1)

## 2023-11-11 LAB — RETICULOCYTES
Immature Retic Fract: 32.6 % — ABNORMAL HIGH (ref 2.3–15.9)
RBC.: 2.77 MIL/uL — ABNORMAL LOW (ref 3.87–5.11)
Retic Count, Absolute: 115.2 10*3/uL (ref 19.0–186.0)
Retic Ct Pct: 4.2 % — ABNORMAL HIGH (ref 0.4–3.1)

## 2023-11-11 LAB — ANTI-SMOOTH MUSCLE ANTIBODY, IGG: F-Actin IgG: 5 U (ref 0–19)

## 2023-11-11 LAB — LACTATE DEHYDROGENASE: LDH: 196 U/L — ABNORMAL HIGH (ref 98–192)

## 2023-11-11 LAB — GLUCOSE, CAPILLARY
Glucose-Capillary: 113 mg/dL — ABNORMAL HIGH (ref 70–99)
Glucose-Capillary: 87 mg/dL (ref 70–99)
Glucose-Capillary: 109 mg/dL — ABNORMAL HIGH (ref 70–99)

## 2023-11-11 LAB — CULTURE, BLOOD (ROUTINE X 2)
Culture: NO GROWTH
Special Requests: ADEQUATE

## 2023-11-11 LAB — CALCITRIOL (1,25 DI-OH VIT D): Vit D, 1,25-Dihydroxy: 18.7 pg/mL — ABNORMAL LOW (ref 24.8–81.5)

## 2023-11-11 LAB — MITOCHONDRIAL ANTIBODIES: Mitochondrial M2 Ab, IgG: <20 U (ref 0.0–20.0)

## 2023-11-11 MED ORDER — VITAMIN D (ERGOCALCIFEROL) 1.25 MG (50000 UNIT) PO CAPS: 50000.0000 [IU] | ORAL_CAPSULE | ORAL | Status: DC

## 2023-11-11 MED ORDER — POTASSIUM CHLORIDE CRYS ER 20 MEQ PO TBCR
40.0000 meq | EXTENDED_RELEASE_TABLET | Freq: Once | ORAL | Status: AC
  Administered 2023-11-11: 40 meq via ORAL
  Filled 2023-11-11: qty 2

## 2023-11-11 MED ORDER — FUROSEMIDE 40 MG PO TABS
40.0000 mg | ORAL_TABLET | Freq: Once | ORAL | Status: AC
  Administered 2023-11-11: 40 mg via ORAL
  Filled 2023-11-11: qty 1

## 2023-11-11 MED ORDER — VITAMIN D 25 MCG (1000 UNIT) PO TABS
1000.0000 [IU] | ORAL_TABLET | Freq: Every day | ORAL | Status: DC
  Administered 2023-11-11 – 2023-11-25 (×15): 1000 [IU] via ORAL
  Filled 2023-11-11 (×15): qty 1

## 2023-11-11 NOTE — Progress Notes (Signed)
 Physical Therapy Treatment Patient Details Name: Brianna Wheeler MRN: [id number] DOB: [birth year] Today's Date: 11/11/2023   History of Present Illness Pt is a 68 y/o female presenting after a fall at home and AMS. Pt admitted for acute encephalopathy, sepsis and BLE/abdominal edema. LP 4/17. PMH: HTN, hypothyroidism, prediabetes, hyperlipidemia, osteoarthritis, GERD, asthma    PT Comments  Pt seen for PT tx with pt agreeable, son present in room. Pt is able to complete supine>sit with supervision, sit>supine with mod assist with [name] elevated, bed rails. Pt transfers STS from significantly elevated EOB with min assist fade to CGA with cuing re: hand placement. Pt is able to ambulate in room with RW & min assist fade to CGA with impaired gait pattern as noted below. Pt would benefit from ongoing PT treatment to progress mobility & reduce fall risk. Pt is hopeful to d/c home.    If plan is discharge home, recommend the following: A little help with bathing/dressing/bathroom;Assistance with cooking/housework;A lot of help with walking and/or transfers;Assist for transportation;Help with stairs or ramp for entrance   Can travel by private vehicle     No  Equipment Recommendations  Hospital bed;Rolling walker (2 wheels);BSC/3in1    Recommendations for Other Services       Precautions / Restrictions Precautions Precautions: Fall Precaution/Restrictions Comments: L eye visual deficits Restrictions Weight Bearing Restrictions Per Provider Order: No     Mobility  Bed Mobility Overal bed mobility: Needs Assistance Bed Mobility: Supine to Sit, Sit to Supine     Supine to sit: Supervision, [name] elevated, Used rails (extra time, exits R side of bed) Sit to supine: [name] elevated, Used rails, Mod assist (encouragement to attempt to lift BLE into bed but pt requires significant assistance)   General bed mobility comments: encouragement to attempt scooting to [facility] as pt initially reporting she can't  without trying; pt able to scoot to [facility] with bed rails (cuing for technique) & trendelenburg position    Transfers Overall transfer level: Needs assistance Equipment used: Rolling walker (2 wheels) Transfers: Sit to/from Stand Sit to Stand: Contact guard assist, Min assist, From elevated surface           General transfer comment: STS from EOB, significantly elevated EOB (pt is tall), cuing re: hand placement    Ambulation/Gait Ambulation/Gait assistance: Min assist, Contact guard assist Gait Distance (Feet): 10 Feet (+ 25 ft) Assistive device: Rolling walker (2 wheels) Gait Pattern/deviations: Decreased step length - right, Decreased step length - left, Decreased stride length, Shuffle Gait velocity: decreased     General Gait Details: decreased dorsiflexion & heel strike BLE with poor improvement in foot clearance to the point pt begins walking out of her own non-skid socks 2/2 shuffling gait   Stairs             Wheelchair Mobility     Tilt Bed    Modified Rankin (Stroke Patients Only)       Balance Overall balance assessment: Needs assistance Sitting-balance support: No upper extremity supported, Feet supported Sitting balance-[name]: Fair     Standing balance support: Bilateral upper extremity supported, Reliant on assistive device for balance Standing balance-[name]: Fair                              Hotel manager: No apparent difficulties  Cognition Arousal: Alert Behavior During Therapy: WFL for tasks assessed/performed   PT - Cognitive impairments: Safety/Judgement  Following commands: Impaired Following commands impaired: Follows one step commands with increased time    Cueing Cueing Techniques: Verbal cues, Tactile cues, Visual cues  Exercises General Exercises - Lower Extremity Long Arc Quad: AROM, Seated, Left, 10 reps, Strengthening (pt reports she's  unable on R 2/2 pain)    General Comments        Pertinent Vitals/Pain Pain Assessment Pain Assessment: Faces Faces Pain Scale: Hurts even more Pain Location: back, BLE (RLE 2/2 sciatic pain), BUE Pain Descriptors / Indicators: Sore, Discomfort Pain Intervention(s): Monitored during session, Limited activity within patient's tolerance, Repositioned    Home Living                          Prior Function            PT Goals (current goals can now be found in the care plan section) Acute Rehab PT Goals PT Goal Formulation: With patient/family Time For Goal Achievement: 11/21/23 Potential to Achieve Goals: Good Progress towards PT goals: Progressing toward goals    Frequency    Min 2X/week      PT Plan      Co-evaluation              AM-PAC PT "6 Clicks" Mobility   Outcome Measure  Help needed turning from your back to your side while in a flat bed without using bedrails?: A Little Help needed moving from lying on your back to sitting on the side of a flat bed without using bedrails?: A Lot Help needed moving to and from a bed to a chair (including a wheelchair)?: A Little Help needed standing up from a chair using your arms (e.g., wheelchair or bedside chair)?: A Lot (elevated surface) Help needed to walk in hospital room?: A Little Help needed climbing 3-5 steps with a railing? : Total 6 Click Score: 14    End of Session Equipment Utilized During Treatment: Gait belt Activity Tolerance: Patient tolerated treatment well Patient left: in bed;with call bell/phone within reach;with bed alarm set;with family/visitor present Nurse Communication: Mobility status PT Visit Diagnosis: Unsteadiness on feet (R26.81);Muscle weakness (generalized) (M62.81);Pain;Other abnormalities of gait and mobility (R26.89) Pain - Right/Left: Right Pain - part of body: Leg     Time: 1414-1440 PT Time Calculation (min) (ACUTE ONLY): 26 min  Charges:    $Therapeutic  Activity: 23-37 mins PT General Charges $$ ACUTE PT VISIT: 1 Visit                     Emaline Handsome, PT, DPT 11/11/23, 2:46 PM   Brianna Wheeler 11/11/2023, 2:45 PM

## 2023-11-11 NOTE — Assessment & Plan Note (Signed)
 Vit D deficient. Both Vit D, 25 hydroxy and Vit D, 1, 25 Hydroxy - will start patient on daily Vit D and have patient follow up Vit D 1,25 Hydroxy outpatient to follow up to ensure no associated renal disease causing Vit D deficiency

## 2023-11-11 NOTE — Assessment & Plan Note (Signed)
 DMII: A1C 4.3, glucoses have been stable. Will decrease CBG checks to BID HTN: holding ISO of normotensive BPs HLD: holding statin  Hypothyroidism: continue synthroid   Pain: continue home tramadol 

## 2023-11-11 NOTE — TOC Initial Note (Signed)
 Transition of Care [facility]) - Initial/Assessment Note    Brianna Wheeler Details  Name: Brianna Wheeler MRN: [id number] Date of Birth: 12-24-1955  Transition of Care [facility] CM/SW Contact:    Carmon Christen, LCSWA Phone Number: 11/11/2023, 5:40 PM  Clinical Narrative:                  CSW received consult for possible SNF placement at time of discharge. CSW spoke with Brianna Wheeler and Brianna Wheeler at bedside regarding PT recommendation of SNF placement at time of discharge. Brianna Wheeler reports PTA she comes from home with disabled Wheeler.Brianna Wheeler expressed understanding of PT recommendation and politely declined SNF placement at time of discharge. Brianna Wheeler would like to return home when medically stable. Brianna Wheeler would like to speak with CM to discuss home needs. Brianna Wheeler reports she has supportive neighbors. APS currently following.APS report was made due to Brianna Wheeler being present with her disabled Wheeler Brianna Wheeler, with no other family available to assist in his care.[place] APS investigator [phone]) stated  that per the initial meeting with Brianna Wheeler and [name] appears to have severe separation anxiety which causes him to be in distress. Shonte states due to there being no family or friends willing or able to assist, that Brianna Wheeler should remain at bedside with Brianna Wheeler. All questions answered by Brianna Wheeler.  No further questions reported at this time. CSW to continue to follow and assist with discharge planning needs.        Brianna Wheeler Goals and CMS Choice            Expected Discharge Plan and Services                                              Prior Living Arrangements/Services                       Activities of Daily Living   ADL Screening (condition at time of admission) Independently performs ADLs?: Yes (appropriate for developmental age) Is the Brianna Wheeler deaf or have difficulty hearing?: No Does the Brianna Wheeler have difficulty seeing, even when wearing glasses/contacts?: No Does the Brianna Wheeler  have difficulty concentrating, remembering, or making decisions?: No  Permission Sought/Granted                  Emotional Assessment              Admission diagnosis:  Portal vein thrombosis [I81] Splenic vein thrombosis [I82.890] Sepsis secondary to UTI (HCC) [A41.9, N39.0] Sepsis (HCC) [A41.9] Brianna Wheeler Active Problem List   Diagnosis Date Noted   Vitamin D  deficiency 11/11/2023   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   Hyperphosphatemia 11/08/2023   High serum transferrin saturation 11/08/2023   Hepatic cirrhosis (HCC) 11/08/2023   Pressure injury of buttock, stage 1 11/07/2023   Cirrhosis of liver without ascites (HCC) 11/07/2023   Acute metabolic encephalopathy 11/06/2023   E. coli UTI (urinary tract infection) 11/06/2023   Chronic health problem 11/06/2023   Hyperbilirubinemia with Jaundice 11/06/2023   Portal venous thrombus 11/06/2023   Swelling of both lower extremities 11/06/2023   Portal hypertension (HCC) 11/06/2023   Possible Portal vein/Splenic vein thrombosis on CT 11/06/2023   Anasarca 11/06/2023   Left eye Retinal Detachment 11/06/2023   Calcification of lens, Left 11/06/2023   Eosinophilia, unspecified 11/06/2023   Thrombocytopenia (HCC) 11/06/2023   Gammopathy, monoclonal 11/06/2023   Systolic murmur 11/06/2023  Right knee pain 04/29/2023   Tremor of both hands 11/02/2022   Headache 04/05/2022   Frequent falls 09/13/2021   Hypokalemia 07/28/2021   Morbid obesity (HCC) 12/08/2020   Adjustment reaction with mixed emotional features 09/12/2020   Serum gammaglobulin increased 02/11/2019   Chronic venous insufficiency 01/28/2019   Nocturia 01/28/2019   GERD (gastroesophageal reflux disease) 05/01/2016   Vertebral compression fracture (HCC) 12/09/2015   Metatarsalgia of both feet 09/20/2015   Allergic rhinitis 08/30/2015   Transaminitis 04/29/2015   Cataract 01/01/2013   Back pain 06/10/2012   Colon cancer screening 02/11/2012   Asthma  12/07/2011   Depression 05/10/2011   Chronic sinusitis 09/20/2010   Osteoarthritis 04/07/2009   Hypothyroidism 03/03/2008   Lumbar back pain with radiculopathy affecting right lower extremity 10/03/2007   Prediabetes 05/08/2007   HLD (hyperlipidemia) 09/19/2006   HYPERTENSION, BENIGN SYSTEMIC 09/19/2006   PCP:  Kandis Ormond, DO Pharmacy:   [facility] [address] [place] - [address] [place] [address] Phone: [phone] Fax: [phone]  [facility] [facility] [address] [place] [address] Phone: [phone] Fax: [phone]     Social Drivers of Health (SDOH) Social History: SDOH Screenings   Food Insecurity: No Food Insecurity (11/07/2023)  Housing: Low Risk  (11/07/2023)  Transportation Needs: No Transportation Needs (11/07/2023)  Utilities: Not At Risk (11/07/2023)  Alcohol Screen: Low Risk  (09/30/2023)  Depression (PHQ2-9): Low Risk  (09/30/2023)  Financial Resource Strain: Low Risk  (09/30/2023)  Physical Activity: Insufficiently Active (09/30/2023)  Social Connections: Moderately Integrated (11/07/2023)  Stress: No Stress Concern Present (09/30/2023)  Tobacco Use: Low Risk  (11/06/2023)  Health Literacy: Adequate Health Literacy (09/30/2023)   SDOH Interventions:     Readmission Risk Interventions     No data to display

## 2023-11-11 NOTE — Progress Notes (Addendum)
 Daily Progress Note Intern Pager: [phone]  Patient name: Brianna Wheeler Medical record number: [id number] Date of birth: 09/27/1955 Age: 68 y.o. Gender: female  Primary Care Provider: Kandis Ormond, DO Consultants: Heme/Onc, GI  Code Status: Full   Pt Overview and Major Events to Date:  4/16: Admitted  Assessment and Plan: Patient is 68 year old female with past medical history of diabetes, hypertension, hypothyroidism admitted for altered mental status secondary to urosepsis and hepatic encephalopathy.  Assessment & Plan Acute metabolic encephalopathy Patient continues to be at baseline.  Patient had urine output of 775 mL overnight. -Appreciate GI recs -Continue lactulose  twice daily - 40mg  PO lasix  for today  - fall precauations - AM CBC, CMP  Portal venous thrombus Stable.  - holding DVT ppx per heme/onc and GI recs. Platelets continue to decrease to 45 this AM. - Awaiting labs for work up of abnormal labs including haptoglobin <10, decreased fibrinogen , elevated LDH, elevated iron saturation ratio (90%), and low TIBC, elevated IgG, decreased ceruloplasmin, and low platelet count.  - pending multiple labs:  - hemochromatosis   - kappa/lambda chains  - MM labs  - UPEP 24 hrs  - ANA, Anti-SM, mitochondrial antibody - EGD prior to discharge - AM CBC, CMP  E. coli UTI (urinary tract infection) Pansensitive. Transitioned to oral keflex  4/20 for a total of 7 day course ( 4/17 - 4/23) - keflex  500 mg BID - tylenol  prn for pain Chronic health problem DMII: A1C 4.3, glucoses have been stable. Will decrease CBG checks to BID HTN: holding ISO of normotensive BPs HLD: holding statin  Hypothyroidism: continue synthroid   Pain: continue home tramadol   Vitamin D  deficiency Vit D deficient. Both Vit D, 25 hydroxy and Vit D, 1, 25 Hydroxy - will start patient on daily Vit D and have patient follow up Vit D 1,25 Hydroxy outpatient to follow up to ensure no associated renal  disease causing Vit D deficiency   FEN/GI: Regular PPx: SCDs, holding pharmacological prophylaxis in setting of increased bleeding risk Dispo: PT/OT recommended SNF, the patient will not leave her son at home alone.  Likely home with home health  Subjective:  NAD  Objective: Temp:  [97.5 F (36.4 C)-97.9 F (36.6 C)] 97.5 F (36.4 C) (04/21 0439) Pulse Rate:  [60-73] 60 (04/21 0439) Resp:  [18-20] 20 (04/21 0439) BP: (102-138)/(48-69) 114/54 (04/21 0439) SpO2:  [96 %-100 %] 99 % (04/21 0439) Physical Exam: General: obese, NAD Cardiovascular: RRR, systolic murmur Respiratory: CTAB, distant breath sounds Abdomen: distended, not TTP Extremities: 2-3 + pitting edema bilaterally, not TTP   Laboratory: Most recent CBC Lab Results  Component Value Date   WBC 3.5 (L) 11/11/2023   HGB 9.7 (L) 11/11/2023   HCT 29.8 (L) 11/11/2023   MCV 106.4 (H) 11/11/2023   PLT 45 (L) 11/11/2023   Most recent BMP    Latest Ref Rng & Units 11/11/2023    5:00 AM 11/10/2023    6:17 AM 11/09/2023    5:52 AM  CMP  Glucose 70 - 99 mg/dL 84  82  76   BUN 8 - 23 mg/dL 13  12  11    Creatinine 0.44 - 1.00 mg/dL 1.30  8.65  7.84   Sodium 135 - 145 mmol/L 134  134  134   Potassium 3.5 - 5.1 mmol/L 3.9  3.9  3.5   Chloride 98 - 111 mmol/L 107  105  108   CO2 22 - 32 mmol/L 23  25  23   Calcium  8.9 - 10.3 mg/dL 7.5  7.7  7.6   Total Protein 6.5 - 8.1 g/dL 5.2  5.6  5.2   Total Bilirubin 0.0 - 1.2 mg/dL 2.6  2.4  2.9   Alkaline Phos 38 - 126 U/L 60  61  53   AST 15 - 41 U/L 109  118  121   ALT 0 - 44 U/L 38  39  38     LDH: 196  Imaging/Diagnostic Tests: No new imaging  Farris Hong, MD 11/11/2023, 7:15 AM  PGY-1,  Family Medicine FPTS Intern pager: [phone], text pages welcome Secure chat group [facility] Teaching Service

## 2023-11-11 NOTE — Assessment & Plan Note (Signed)
>>  ASSESSMENT AND PLAN FOR PORTAL VENOUS THROMBUS WRITTEN ON 11/11/2023  7:51 AM BY Farris Hong, MD  Stable.  - holding DVT ppx per heme/onc and GI recs. Platelets continue to decrease to 45 this AM. - Awaiting labs for work up of abnormal labs including haptoglobin <10, decreased fibrinogen , elevated LDH, elevated iron saturation ratio (90%), and low TIBC, elevated IgG, decreased ceruloplasmin, and low platelet count.  - pending multiple labs:  - hemochromatosis   - kappa/lambda chains  - MM labs  - UPEP 24 hrs  - ANA, Anti-SM, mitochondrial antibody - EGD prior to discharge - AM CBC, CMP

## 2023-11-11 NOTE — Assessment & Plan Note (Addendum)
 Patient continues to be at baseline.  Patient had urine output of 775 mL overnight. -Appreciate GI recs -Continue lactulose  twice daily - 40mg  PO lasix  for today  - fall precauations - AM CBC, CMP

## 2023-11-11 NOTE — Assessment & Plan Note (Signed)
 Stable.  - holding DVT ppx per heme/onc and GI recs. Platelets continue to decrease to 45 this AM. - Awaiting labs for work up of abnormal labs including haptoglobin <10, decreased fibrinogen , elevated LDH, elevated iron saturation ratio (90%), and low TIBC, elevated IgG, decreased ceruloplasmin, and low platelet count.  - pending multiple labs:  - hemochromatosis   - kappa/lambda chains  - MM labs  - UPEP 24 hrs  - ANA, Anti-SM, mitochondrial antibody - EGD prior to discharge - AM CBC, CMP

## 2023-11-11 NOTE — Assessment & Plan Note (Signed)
 Pansensitive. Transitioned to oral keflex  4/20 for a total of 7 day course ( 4/17 - 4/23) - keflex  500 mg BID - tylenol  prn for pain

## 2023-11-11 NOTE — Progress Notes (Addendum)
 Patient ID: Brianna Wheeler, female   DOB: 03-30-1956, 68 y.o.   MRN: [id number]    Progress Note   Subjective   Brianna # 6 CC;decompensated cirrhosis admitted with AMS/sepsis UTI, portal vein thrombosis(not anticoagulating) (MELD NA=22  Labs today -WBC 3.5/hemoglobin 9.7/hematocrit 29.8/MCV 106/platelets 45 Potassium 3.9/BUN 13/creatinine 0.87/albumin less than 1.5 T. bili 2.6/AST 109  Patient mentating well today, son at bedside, she has no complaints of abdominal discomfort, has been eating without difficulty.  They feel like she has been improving from an edema standpoint.   Objective   Vital signs in last 24 hours: Temp:  [97.5 F (36.4 C)-97.9 F (36.6 C)] 97.8 F (36.6 C) (04/21 0801) Pulse Rate:  [60-73] 70 (04/21 0801) Resp:  [19-20] 19 (04/21 0801) BP: (102-130)/(48-61) 130/61 (04/21 0801) SpO2:  [96 %-99 %] 98 % (04/21 0801) Last BM Date : 11/10/23 General:   Older white female in NAD, morbidly obese Heart:  Regular rate and rhythm; no murmurs Lungs: Respirations even and unlabored, decreased breath sounds bilateral bases Abdomen:  Soft, nontender, morbidly obese normal bowel sounds. Extremities: 2+ edema bilateral lower extremities Neurologic:  Alert and oriented,  grossly normal neurologically. Psych:  Cooperative. Normal mood and affect.  Intake/Output from previous Brianna: 04/20 0701 - 04/21 0700 In: 720 [P.O.:720] Out: 775 [Urine:775] Intake/Output this shift: No intake/output data recorded.  Lab Results: Recent Labs    11/09/23 0552 11/10/23 0617 11/11/23 0500  WBC 3.3* 3.2* 3.5*  HGB 9.5* 9.6* 9.7*  HCT 28.2* 28.6* 29.8*  PLT 42* 50* 45*   BMET Recent Labs    11/09/23 0552 11/10/23 0617 11/11/23 0500  NA 134* 134* 134*  K 3.5 3.9 3.9  CL 108 105 107  CO2 23 25 23   GLUCOSE 76 82 84  BUN 11 12 13   CREATININE 0.77 0.76 0.87  CALCIUM  7.6* 7.7* 7.5*   LFT Recent Labs    11/11/23 0500  PROT 5.2*  ALBUMIN <1.5*  AST 109*  ALT 38  ALKPHOS  60  BILITOT 2.6*   PT/INR No results for input(s): "LABPROT", "INR" in the last 72 hours.      Assessment / Plan:    #38 68 year old white female admitted 5 days ago with altered mental status and sepsis secondary to UT/E. coli.  New diagnosis of decompensated cirrhosis made at the time of admission with labs and imaging.  Felt to have had a component of hepatic encephalopathy on admission. Cirrhosis likely MASH related-multiple markers still pending  MELD NA-24  Patient is currently mentating well,/encephalopathy resolved.  #2 new finding of portal vein thrombosis, likely chronic-hematology has evaluated and not recommending anticoagulation  #3 anemia hemoglobin 12.1 now down to 9.5-in part due to dilution Folate deficiency, correcting No iron deficiency-elevated iron sat on admission, normal ferritin  #4 morbid obesity #5 severe hypoalbuminemia   Plan; continue folate supplementation Continue lactulose  20 g 3 times daily  Will plan for EGD tomorrow with Dr. Majesty Oehlert  to screen for esophageal varices.  Procedure was discussed with the patient today at bedside along with her son.  We reviewed indications risks and benefits and she is agreeable to proceed.        Principal Problem:   E. coli UTI (urinary tract infection) Active Problems:   Hypothyroidism   Morbid obesity (HCC)   Acute metabolic encephalopathy   Chronic health problem   Hyperbilirubinemia with Jaundice   Portal venous thrombus   Swelling of both lower extremities   Portal hypertension (HCC)  Possible Portal vein/Splenic vein thrombosis on CT   Anasarca   Thrombocytopenia (HCC)   Gammopathy, monoclonal   Systolic murmur   Pressure injury of buttock, stage 1   Cirrhosis of liver without ascites (HCC)   Hyperphosphatemia   High serum transferrin saturation   Hepatic cirrhosis (HCC)     LOS: 5 days   Amy Esterwood PA-C 11/11/2023, 10:41 AM   Attending physician's note   I personally saw  the patient and performed a substantive portion of this encounter (>50% time spent), including a complete performance of at least one of the key components (MDM, Hx and/or Exam), in conjunction with the APP.  I agree with the APP's note, impression, and recommendations with additional input as follows.     New diagnosis of decompensated cirrhosis likely secondary to NASH with portal vein thrombus Will plan for EGD for esophageal varices screening tomorrow N.p.o. after midnight  The patient was provided an opportunity to ask questions and all were answered. The patient agreed with the plan and demonstrated an understanding of the instructions.   Lorena Rolling , MD [phone]

## 2023-11-12 DIAGNOSIS — K7460 Unspecified cirrhosis of liver: Secondary | ICD-10-CM | POA: Diagnosis not present

## 2023-11-12 DIAGNOSIS — D649 Anemia, unspecified: Secondary | ICD-10-CM | POA: Diagnosis not present

## 2023-11-12 DIAGNOSIS — I81 Portal vein thrombosis: Secondary | ICD-10-CM | POA: Diagnosis not present

## 2023-11-12 DIAGNOSIS — G9341 Metabolic encephalopathy: Secondary | ICD-10-CM | POA: Diagnosis not present

## 2023-11-12 DIAGNOSIS — R601 Generalized edema: Secondary | ICD-10-CM | POA: Diagnosis not present

## 2023-11-12 DIAGNOSIS — B9620 Unspecified Escherichia coli [E. coli] as the cause of diseases classified elsewhere: Secondary | ICD-10-CM | POA: Diagnosis not present

## 2023-11-12 DIAGNOSIS — N390 Urinary tract infection, site not specified: Secondary | ICD-10-CM | POA: Diagnosis not present

## 2023-11-12 LAB — PROTEIN ELECTROPHORESIS, SERUM
A/G Ratio: 0.6 — ABNORMAL LOW (ref 0.7–1.7)
Albumin ELP: 2.1 g/dL — ABNORMAL LOW (ref 2.9–4.4)
Alpha-1-Globulin: 0.1 g/dL (ref 0.0–0.4)
Alpha-2-Globulin: 0.4 g/dL (ref 0.4–1.0)
Beta Globulin: 0.5 g/dL — ABNORMAL LOW (ref 0.7–1.3)
Gamma Globulin: 2.8 g/dL — ABNORMAL HIGH (ref 0.4–1.8)
Globulin, Total: 3.8 g/dL (ref 2.2–3.9)
M-Spike, %: 2.3 g/dL — ABNORMAL HIGH
Total Protein ELP: 5.9 g/dL — ABNORMAL LOW (ref 6.0–8.5)

## 2023-11-12 LAB — CBC
HCT: 29.6 % — ABNORMAL LOW (ref 36.0–46.0)
Hemoglobin: 9.9 g/dL — ABNORMAL LOW (ref 12.0–15.0)
MCH: 35.6 pg — ABNORMAL HIGH (ref 26.0–34.0)
MCHC: 33.4 g/dL (ref 30.0–36.0)
MCV: 106.5 fL — ABNORMAL HIGH (ref 80.0–100.0)
Platelets: 49 10*3/uL — ABNORMAL LOW (ref 150–400)
RBC: 2.78 MIL/uL — ABNORMAL LOW (ref 3.87–5.11)
RDW: 18.5 % — ABNORMAL HIGH (ref 11.5–15.5)
WBC: 3.7 10*3/uL — ABNORMAL LOW (ref 4.0–10.5)
nRBC: 0 % (ref 0.0–0.2)

## 2023-11-12 LAB — COMPREHENSIVE METABOLIC PANEL WITH GFR
ALT: 37 U/L (ref 0–44)
AST: 103 U/L — ABNORMAL HIGH (ref 15–41)
Albumin: <1.5 g/dL — ABNORMAL LOW (ref 3.5–5.0)
Alkaline Phosphatase: 59 U/L (ref 38–126)
Anion gap: 6 (ref 5–15)
BUN: 13 mg/dL (ref 8–23)
CO2: 22 mmol/L (ref 22–32)
Calcium: 7.7 mg/dL — ABNORMAL LOW (ref 8.9–10.3)
Chloride: 108 mmol/L (ref 98–111)
Creatinine, Ser: 0.91 mg/dL (ref 0.44–1.00)
GFR, Estimated: >60 mL/min (ref >60)
Glucose, Bld: 85 mg/dL (ref 70–99)
Potassium: 4.3 mmol/L (ref 3.5–5.1)
Sodium: 136 mmol/L (ref 135–145)
Total Bilirubin: 2.6 mg/dL — ABNORMAL HIGH (ref 0.0–1.2)
Total Protein: 5.3 g/dL — ABNORMAL LOW (ref 6.5–8.1)

## 2023-11-12 LAB — MULTIPLE MYELOMA PANEL, SERUM
Albumin SerPl Elph-Mcnc: 2 g/dL — ABNORMAL LOW (ref 2.9–4.4)
Albumin/Glob SerPl: 0.6 — ABNORMAL LOW (ref 0.7–1.7)
Alpha 1: 0.1 g/dL (ref 0.0–0.4)
Alpha2 Glob SerPl Elph-Mcnc: 0.3 g/dL — ABNORMAL LOW (ref 0.4–1.0)
B-Globulin SerPl Elph-Mcnc: 0.7 g/dL (ref 0.7–1.3)
Gamma Glob SerPl Elph-Mcnc: 2.8 g/dL — ABNORMAL HIGH (ref 0.4–1.8)
Globulin, Total: 3.9 g/dL (ref 2.2–3.9)
IgA: 299 mg/dL (ref 87–352)
IgG (Immunoglobin G), Serum: 3645 mg/dL — ABNORMAL HIGH (ref 586–1602)
IgM (Immunoglobulin M), Srm: 14 mg/dL — ABNORMAL LOW (ref 26–217)
M Protein SerPl Elph-Mcnc: 2.3 g/dL — ABNORMAL HIGH
Total Protein ELP: 5.9 g/dL — ABNORMAL LOW (ref 6.0–8.5)

## 2023-11-12 LAB — UPEP/UIFE/LIGHT CHAINS/TP, 24-HR UR
% BETA, Urine: 24.4 %
ALPHA 1 URINE: 2.8 %
Albumin, U: 36.6 %
Alpha 2, Urine: 12.1 %
Free Kappa Lt Chains,Ur: 9.71 mg/L (ref 1.17–86.46)
Free Kappa/Lambda Ratio: 0.35 — ABNORMAL LOW (ref 1.83–14.26)
Free Lambda Lt Chains,Ur: 27.71 mg/L — ABNORMAL HIGH (ref 0.27–15.21)
GAMMA GLOBULIN URINE: 24.2 %
Total Protein, Urine-Ur/day: 113 mg/(24.h) (ref 30–150)
Total Protein, Urine: 16.1 mg/dL
Total Volume: 700

## 2023-11-12 LAB — GLUCOSE, CAPILLARY
Glucose-Capillary: 77 mg/dL (ref 70–99)
Glucose-Capillary: 93 mg/dL (ref 70–99)
Glucose-Capillary: 79 mg/dL (ref 70–99)

## 2023-11-12 LAB — ANA: Anti Nuclear Antibody (ANA): NEGATIVE

## 2023-11-12 LAB — PROTIME-INR
INR: 2 — ABNORMAL HIGH (ref 0.8–1.2)
Prothrombin Time: 22.9 s — ABNORMAL HIGH (ref 11.4–15.2)

## 2023-11-12 NOTE — Assessment & Plan Note (Addendum)
 DMII: stable glucose. Continue CBG checks BID HTN: holding ISO of normotensive BPDMII: A1C 4.3, glucoses have been stable. Will decrease CBG checks to BID HLD: holding statin Hypothyroidism: continue synthroid  Pain: continue home tramadol   Vit D deficiency: supplementing with daily vit D. Will need follow up of vit D levels outpatient

## 2023-11-12 NOTE — Assessment & Plan Note (Signed)
 Labs were significant for elevated IgG, mildly elevated lambda light chains.  Hematology is following.  Per this morning's note, concern for amyloidosis versus other plasmacytic disorder. - Bone marrow biopsy scheduled with IR for 4/23 -Hematology following, appreciate recs

## 2023-11-12 NOTE — Assessment & Plan Note (Deleted)
 Bilateral DVT US  negative. Swelling likely ISO of anasarca d/t hepatic dysfunction. Brianna Wheeler

## 2023-11-12 NOTE — Assessment & Plan Note (Addendum)
 Patient is at baseline.  1.1 L output documented overnight. - Appreciate GI recs - Continue lactulose  twice daily daily - Will consider reducing Lasix  today - Fall precautions - AM CBC, CMP

## 2023-11-12 NOTE — Assessment & Plan Note (Addendum)
 Continue to treat wit keflex , total course of 7 days ( 4/17 - 4/23)

## 2023-11-12 NOTE — Progress Notes (Signed)
 Physical Therapy Treatment Patient Details Name: Brianna Wheeler MRN: [id number] DOB: 1955/12/07 Today's Date: 11/12/2023   History of Present Illness Pt is a 68 y/o female presenting after a fall at home and AMS. Pt admitted for acute encephalopathy, sepsis and BLE/abdominal edema. LP 4/17. PMH: HTN, hypothyroidism, prediabetes, hyperlipidemia, osteoarthritis, GERD, asthma    PT Comments  Pt entered room as NT attempting to get patient back to bed from lower recliner surface. Pt with poor insight into safety and had scooted hips almost off of the recliner seat and was leaning backwards making it very difficult to assist her back. With maximal multimodal cuing pt able to scoot hips back in recliner. With assist x3 pt requires total A to come to standing and steady. Pt with increased R LE pain with standing, and requires minAx2 for pivotal steps back to bed. D/c plans remain appropriate, due to pt weakness and poor insight into level of assist she needs to facilitate safe movement, let alone caring for her self and her son. PT will continue to follow acutely.    If plan is discharge home, recommend the following: A little help with bathing/dressing/bathroom;Assistance with cooking/housework;Assist for transportation;Help with stairs or ramp for entrance;Two people to help with walking and/or transfers;Direct supervision/assist for medications management;Direct supervision/assist for financial management   Can travel by private vehicle     No  Equipment Recommendations  Hospital bed;Rolling walker (2 wheels);BSC/3in1       Precautions / Restrictions Precautions Precautions: Fall Recall of Precautions/Restrictions: Impaired Precaution/Restrictions Comments: L eye visual deficits Restrictions Weight Bearing Restrictions Per Provider Order: No     Mobility  Bed Mobility Overal bed mobility: Needs Assistance Bed Mobility: Sit to Supine       Sit to supine: Total assist, Used rails    General bed mobility comments: pt requires total A for returning B LE to bed with complaints of L LE sciatic pain limiting her ability to assist    Transfers Overall transfer level: Needs assistance   Transfers: Sit to/from Stand Sit to Stand: Total assist, +2 physical assistance (+3)   Step pivot transfers: Min assist, +2 physical assistance       General transfer comment: PT entered room as NT struggling to get pt back to bed after sitting up in recliner, pt had slid her hips very far forward in the seat and was leaning back in the chair, requires maximal cuing for forward lean and increased effort for scooting hips back in chair, MS and MT provides assist at arms on either side and PT provided assist at hips with gait belt to come to standing from lower recliner surface, increased difficulty getting pt CoG over her BoS for power up and steadying, due to pt's height, minA x69for steadying with pivotal steps from recliner back to bed          Balance Overall balance assessment: Needs assistance Sitting-balance support: No upper extremity supported, Feet supported Sitting balance-[name]: Fair     Standing balance support: Bilateral upper extremity supported, Reliant on assistive device for balance Standing balance-[name]: Zero Standing balance comment: requires outside assist when fatigued                            Communication Communication Communication: No apparent difficulties  Cognition Arousal: Alert Behavior During Therapy: WFL for tasks assessed/performed   PT - Cognitive impairments: Safety/Judgement  Following commands: Impaired Following commands impaired: Follows one step commands with increased time    Cueing Cueing Techniques: Verbal cues, Tactile cues, Visual cues     General Comments General comments (skin integrity, edema, etc.): Son present throughout session, pt with very poor insight to the  amount of assist she needs with fatigue.      Pertinent Vitals/Pain Pain Assessment Pain Assessment: Faces Faces Pain Scale: Hurts even more Breathing: normal Facial Expression: smiling or inexpressive Body Language: relaxed Pain Location: back, BLE (RLE 2/2 sciatic pain), BUE Pain Descriptors / Indicators: Sore, Discomfort Pain Intervention(s): Limited activity within patient's tolerance, Monitored during session, Repositioned     PT Goals (current goals can now be found in the care plan section) Acute Rehab PT Goals PT Goal Formulation: With patient/family Time For Goal Achievement: 11/21/23 Potential to Achieve Goals: Good Progress towards PT goals: Not progressing toward goals - comment    Frequency    Min 2X/week       AM-PAC PT "6 Clicks" Mobility   Outcome Measure  Help needed turning from your back to your side while in a flat bed without using bedrails?: A Little Help needed moving from lying on your back to sitting on the side of a flat bed without using bedrails?: A Lot Help needed moving to and from a bed to a chair (including a wheelchair)?: A Little Help needed standing up from a chair using your arms (e.g., wheelchair or bedside chair)?: A Lot (elevated surface, total A from lower surface) Help needed to walk in hospital room?: A Lot Help needed climbing 3-5 steps with a railing? : Total 6 Click Score: 13    End of Session Equipment Utilized During Treatment: Gait belt Activity Tolerance: Patient tolerated treatment well Patient left: in bed;with call bell/phone within reach;with bed alarm set;with family/visitor present Nurse Communication: Mobility status PT Visit Diagnosis: Unsteadiness on feet (R26.81);Muscle weakness (generalized) (M62.81);Pain;Other abnormalities of gait and mobility (R26.89) Pain - Right/Left: Right Pain - part of body: Leg     Time: 1050-1100 PT Time Calculation (min) (ACUTE ONLY): 10 min  Charges:    $Therapeutic  Activity: 8-22 mins PT General Charges $$ ACUTE PT VISIT: 1 Visit                     Brianna Wheeler B. [name] PT, DPT Acute Rehabilitation Services Please use secure chat or  Call Office [phone]    Brianna Wheeler [facility] 11/12/2023, 4:31 PM

## 2023-11-12 NOTE — Assessment & Plan Note (Deleted)
 RUQ concerning for cirrhosis likely secondary to NASH. Indirect hyperbilirubinemia, likely, secondary to sepsis. Total bilirubin continues to trend down.  - Appreciate ongoing GI recs  - AM CMP, Mg, Phos

## 2023-11-12 NOTE — Consult Note (Signed)
 Chief Complaint: Patient was seen in consultation today for anemia; bone marrow biopsy Chief Complaint  Patient presents with   Altered Mental Status   at the request of Dr Lyndle Santa   Supervising Physician: Myrlene Asper  Patient Status: [facility] - In-pt  History of Present Illness: Brianna Wheeler is a 67 y.o. 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  Hematology following for plasmacytic disorder- ongoing work up Anemia; thrombocytopenia Bone marrow biopsy ordered for 4/23 per Dr Maria Shiner  Planned in IR for 4/23 Plt 49 this am  Past Medical History:  Diagnosis Date   Diabetes mellitus    Hiatal hernia 09/19/2006   Qualifier: Diagnosis of   By: Winona Haw         Hyperlipidemia    Hypertension    Left foot pain 07/24/2003   Chronic injury -- multiple broken bones and torn ligaments after accident in garage    Past Surgical History:  Procedure Laterality Date   EYE SURGERY     LUMBAR DISC SURGERY     TUBAL LIGATION      Allergies: Hydrocodone -acetaminophen , Nsaids, Oxycodone-acetaminophen , Tetracycline, and Bactrim  [sulfamethoxazole -trimethoprim ]  Medications: Prior to Admission medications   Medication Sig Start Date End Date Taking? Authorizing Provider  albuterol  (VENTOLIN  HFA) 108 (90 Base) MCG/ACT inhaler Inhale 2 puffs into the lungs every 6 (six) hours as needed for wheezing or shortness of breath. 09/11/22  Yes Rochester Chuck, MD  Calcium  Carb-Cholecalciferol  (CALCIUM  1000 + D PO) Take by mouth.   Yes [provider]  diclofenac  Sodium (VOLTAREN ) 1 % GEL Apply 2 g topically 4 (four) times daily. Patient taking differently: Apply 2 g topically daily as needed (low back pain; knee pain). 05/15/22  Yes Hensel, Azucena Bollard, MD  fexofenadine   (ALLEGRA ) 180 MG tablet Take 1 tablet (180 mg total) by mouth daily. 09/11/22 07/22/24 Yes Rochester Chuck, MD  fluticasone  (FLONASE ) 50 MCG/ACT nasal spray Place 2 sprays into both nostrils daily. Place 2 sprays in each nostril daily 09/11/22  Yes Rochester Chuck, MD  hydrochlorothiazide  (HYDRODIURIL ) 12.5 MG tablet Take 1 tablet (12.5 mg total) by mouth daily. 02/11/23  Yes Rumball, Alison M, DO  levothyroxine  (EUTHYROX ) 125 MCG tablet TAKE 1 TABLET BY MOUTH ONCE DAILY BEFORE BREAKFAST. NEED  TSH  CHECK  AND  OFFICE  VISIT  BEFORE  NEXT  REFILL. 09/13/23  Yes Azell Boll, MD  Multiple Vitamin (MULTIVITAMIN) tablet Take 1 tablet by mouth daily.   Yes [provider]  omeprazole  (PRILOSEC) 20 MG capsule Take 1 capsule (20 mg total) by mouth daily. 04/10/23  Yes Rumball, Alison M, DO  potassium chloride  SA (KLOR-CON  M20) 20 MEQ tablet Take 1 tablet (20 mEq total) by mouth daily. 06/17/23  Yes Rumball, Alison M, DO  rosuvastatin  (CRESTOR ) 20 MG tablet Take 1 tablet by mouth once daily Patient taking differently: Take 20 mg by mouth at bedtime. 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    Son suffers from Armorel. [facility] patient- Deisha Stull.)   Patient has her own vehicle and still drives .   Hobbies: cleaning house, likes home and garden things. Spending time with friends.      Social Drivers of Corporate investment banker Strain: Low Risk  (09/30/2023)   Overall Financial Resource Strain (CARDIA)    Difficulty of Paying Living Expenses: Not hard at all  Food Insecurity: No Food Insecurity (11/07/2023)   Hunger Vital Sign    Worried About Running Out of Food in the Last Year: Never true    Ran Out of Food in the Last Year: Never true  Transportation Needs: No Transportation Needs (11/07/2023)   PRAPARE - Administrator, Civil Service (Medical): No    Lack of Transportation (Non-Medical): No  Physical Activity: Insufficiently Active (09/30/2023)   Exercise Vital Sign    Days of Exercise per Week: 6 days    Minutes of Exercise per Session: 20 min  Stress: No Stress Concern Present (09/30/2023)   [employer] of Occupational Health - Occupational Stress Questionnaire    Feeling of Stress : Not at all  Social Connections: Moderately Integrated (11/07/2023)   Social Connection and Isolation Panel [NHANES]    Frequency of Communication with Friends and Family: More than three times a week    Frequency of Social Gatherings with Friends and Family: Three times a week    Attends Religious Services: More than 4 times per year    Active Member of Clubs or Organizations: Yes    Attends Banker Meetings: Never    Marital Status: Widowed   Review of Systems: A 12 point ROS discussed and pertinent positives are indicated in the HPI above.  All other systems are negative.  Review of Systems  Constitutional:  Positive for activity change, appetite change and fatigue. Negative for fever.  Respiratory:  Positive for shortness of breath. Negative for cough.   Cardiovascular:   Negative for chest pain.  Gastrointestinal:  Negative for abdominal pain.  Neurological:  Positive for weakness.  Psychiatric/Behavioral:  Negative for behavioral problems and confusion.     Vital Signs: BP (!) 115/54 (BP Location: Left Arm)   Pulse 65   Temp 97.6 F (36.4 C) (Oral)   Resp 18   Ht 5\' 11"  (1.803 m) Comment: per previous hx  Wt (!) 306 lb 7 oz (139 kg)   SpO2 97%   BMI 42.74 kg/m   Advance Care Plan: The advanced care plan/surrogate decision maker was discussed at the time of visit and documented in the medical record.    Physical Exam Vitals reviewed.  HENT:     Mouth/Throat:     Mouth: Mucous membranes are moist.  Cardiovascular:     Rate and Rhythm: Normal rate and regular rhythm.     Heart sounds: Normal heart sounds.  Pulmonary:     Breath sounds: Normal breath sounds. No wheezing.  Musculoskeletal:        General: Normal range of motion.  Skin:    General: Skin is warm.     Findings: Bruising present.  Neurological:     Mental Status: She is alert and oriented to person, place, and time.  Psychiatric:        Behavior: Behavior normal.     Imaging: DG FL GUIDED LUMBAR PUNCTURE Result Date: 11/07/2023 CLINICAL DATA:  68 year old female with altered mental status. IR was requested for lumbar puncture. EXAM: LUMBAR PUNCTURE UNDER FLUOROSCOPY PROCEDURE: An appropriate skin entry site was determined fluoroscopically. Operator donned sterile gloves and mask. Skin site was marked, then prepped with Betadine, draped in usual sterile fashion, and infiltrated locally with 1% lidocaine . A 20 gauge spinal needle advanced into the thecal sac at L5-S1 from a left interlaminar approach. Clear colorless CSF spontaneously returned, with opening pressure of 15 cm water. 6.5 ml CSF were collected and divided among 4 sterile vials for the requested laboratory studies. The needle was then removed. The patient tolerated the procedure well and there were no complications.  FLUOROSCOPY: Radiation Exposure Index (as provided by the fluoroscopic device): 32.30 mGy Kerma IMPRESSION: Technically successful lumbar puncture under fluoroscopy. This exam was performed by Lambert Pillion, PA-C, and was supervised and interpreted by Dr. Erica Hau, MD. Electronically Signed   By: Erica Hau M.D.   On: 11/07/2023 11:50   US  ABDOMEN LIMITED WITH LIVER DOPPLER Result Date: 11/07/2023 CLINICAL DATA:  Abnormal liver function tests and high ammonia level. EXAM: DUPLEX ULTRASOUND OF LIVER TECHNIQUE: Color and duplex Doppler ultrasound was performed to evaluate the hepatic in-flow and out-flow vessels. COMPARISON:  None Available. FINDINGS: Liver: Heterogeneous liver parenchyma. There may be a component of underlying cirrhosis. No focal lesion, mass or intrahepatic biliary ductal dilatation identified by ultrasound. Main Portal Vein size: 1.9 cm Portal Vein Velocities Main Prox:  14 cm/sec Main Mid: 13 cm/sec Main Dist:  17 cm/sec Right: Not visualized Left: Not visualized Hepatic Vein Velocities Right:  24 cm/sec Middle:  Not visualized Left:  Not visualized IVC: Present and patent with normal respiratory phasicity. Hepatic Artery Velocity:  Not visualized Splenic Vein Velocity:  26 cm/sec Spleen: 14.2 cm x 5.8 cm x 5.7 cm with a total volume of 243 cm^3 (411 cm^3 is upper limit normal) Portal Vein Occlusion/Thrombus: Visualization of the main portal vein is suboptimal but there does appear to be probable nearly occlusive thrombus in the visualized portal vein with some flow detected. Splenic Vein Occlusion/Thrombus: No Ascites: None Varices: None Low portal vein velocities may be related to the nonocclusive thrombus but a component of portal hypertension is suspected. IMPRESSION: 1. Suspect portal vein thrombus which is nearly occlusive with some slow flow detected in the portal vein by duplex ultrasound. Low portal vein velocities suggest component of underlying portal hypertension. 2.  Suspect underlying cirrhosis. 3. Otherwise limited study with lack of visualization of multiple veins and hepatic artery as above. 4. No ascites detected. Electronically Signed   By: Erica Hau M.D.   On: 11/07/2023 08:30   VAS US  LOWER EXTREMITY VENOUS (DVT) Result Date: 11/06/2023  Lower Venous DVT Study Patient Name:  Brianna Wheeler  Date of Exam:   11/06/2023 Medical Rec #: [id number]  Accession #:    [id number] Date of Birth: 06/03/1956        Patient Gender: F Patient Age:   89 years Exam Location:  [facility] Procedure:      VAS US  LOWER EXTREMITY VENOUS (DVT) Referring Phys: MICHAEL BERO --------------------------------------------------------------------------------  Indications: Swelling, and Edema.  Risk Factors: Past pregnancy and obesity. Limitations: Body habitus and Patient noncompliance. Comparison Study: None. Performing Technologist: Estanislao Heimlich  Examination Guidelines: A complete evaluation includes B-mode imaging, spectral Doppler, color Doppler, and power Doppler as needed of all accessible portions of each vessel. Bilateral testing is considered an integral part of a complete examination. Limited examinations for reoccurring indications may be performed as noted. The reflux portion of the exam is performed with the patient in reverse Trendelenburg.  +---------+---------------+---------+-----------+----------+-------------------+ RIGHT    CompressibilityPhasicitySpontaneityPropertiesThrombus Aging      +---------+---------------+---------+-----------+----------+-------------------+ CFV      Full           Yes      Yes                                      +---------+---------------+---------+-----------+----------+-------------------+ SFJ      Full                                                             +---------+---------------+---------+-----------+----------+-------------------+ FV Prox  Full                                                              +---------+---------------+---------+-----------+----------+-------------------+ FV Mid   Full                                                             +---------+---------------+---------+-----------+----------+-------------------+ FV Distal                        Yes                  Not well visualized +---------+---------------+---------+-----------+----------+-------------------+ PFV      Full                                                             +---------+---------------+---------+-----------+----------+-------------------+ POP      Full           Yes      Yes                                      +---------+---------------+---------+-----------+----------+-------------------+ PTV  Not Visualized      +---------+---------------+---------+-----------+----------+-------------------+ PERO                                                  Not Visualized      +---------+---------------+---------+-----------+----------+-------------------+   +---------+---------------+---------+-----------+----------+-------------------+ LEFT     CompressibilityPhasicitySpontaneityPropertiesThrombus Aging      +---------+---------------+---------+-----------+----------+-------------------+ CFV      Full           Yes      Yes                                      +---------+---------------+---------+-----------+----------+-------------------+ SFJ      Full                                                             +---------+---------------+---------+-----------+----------+-------------------+ FV Prox  Full                                                             +---------+---------------+---------+-----------+----------+-------------------+ FV Mid   Full                                                              +---------+---------------+---------+-----------+----------+-------------------+ FV Distal                        Yes                  Not well visualized +---------+---------------+---------+-----------+----------+-------------------+ PFV      Full                                                             +---------+---------------+---------+-----------+----------+-------------------+ POP      Full           Yes      Yes                                      +---------+---------------+---------+-----------+----------+-------------------+ PTV                              Yes                  Not well visualized +---------+---------------+---------+-----------+----------+-------------------+ PERO  Yes                  Not well visualized +---------+---------------+---------+-----------+----------+-------------------+     Summary: BILATERAL: - No evidence of deep vein thrombosis seen in the lower extremities, bilaterally. -No evidence of popliteal cyst, bilaterally.   *See table(s) above for measurements and observations. Electronically signed by Irvin Mantel on 11/06/2023 at 7:56:35 PM.    Final    EEG adult Result Date: 11/06/2023 Arleene Lack, MD     11/06/2023  1:35 PM Patient Name: Brianna Wheeler MRN: [id number] Epilepsy Attending: Arleene Lack Referring Physician/Provider: Dema Filler, MD Date: 11/06/2023 Duration: 23.04 mins Patient history: 68yo F with ams. EEG to evaluate for seizure Level of alertness: Awake/ lethargic AEDs during EEG study: None Technical aspects: This EEG study was done with scalp electrodes positioned according to the 10-20 International system of electrode placement. Electrical activity was reviewed with band pass filter of 1-70Hz , sensitivity of 7 uV/mm, display speed of 65mm/sec with a 60Hz  notched filter applied as appropriate. EEG data were recorded continuously and digitally stored.  Video monitoring was  available and reviewed as appropriate. Description: EEG showed continuous generalized 3 to 5 Hz theta-delta slowing. Generalized periodic discharges with triphasic morphology at 1.5 -2 Hz were also noted. Hyperventilation and photic stimulation were not performed.   ABNORMALITY - Periodic discharges with triphasic morphology, generalized ( GPDs) - Continuous slow, generalized IMPRESSION: This study is suggestive of moderate to severe diffuse encephalopathy likely related to toxic-metabolic causes. No seizures or definite epileptiform discharges were seen throughout the recording. Arleene Lack   ECHOCARDIOGRAM COMPLETE Result Date: 11/06/2023    ECHOCARDIOGRAM REPORT   Patient Name:   Brianna Wheeler Date of Exam: 11/06/2023 Medical Rec #:  [id number]       Height:       71.0 in Accession #:    [id number]      Weight:       306.4 lb Date of Birth:  06-03-1956       BSA:          2.528 m Patient Age:    67 years        BP:           106/37 mmHg Patient Gender: F               HR:           100 bpm. Exam Location:  Inpatient Procedure: 2D Echo, Cardiac Doppler and Color Doppler (Both Spectral and Color            Flow Doppler were utilized during procedure). Indications:    Murmur  History:        Patient has no prior history of Echocardiogram examinations.                 Risk Factors:Dyslipidemia and Hypertension.  Sonographer:    Sulema Endo RDCS, FE, PE Referring Phys: [id number] MARGARET E PRAY  Sonographer Comments: No subcostal window. IMPRESSIONS  1. Left ventricular ejection fraction, by estimation, is 70 to 75%. The left ventricle has hyperdynamic function. The left ventricle has no regional wall motion abnormalities. Left ventricular diastolic parameters were normal.  2. Right ventricular systolic function is normal. The right ventricular size is normal.  3. The mitral valve is normal in structure. Trivial mitral valve regurgitation. No evidence of mitral stenosis.  4. The aortic valve is normal in  structure. Aortic valve regurgitation is not visualized. No aortic  stenosis is present.  5. The inferior vena cava is normal in size with greater than 50% respiratory variability, suggesting right atrial pressure of 3 mmHg. Comparison(s): Normal LV and RV function. Suspect murmur related to high flow state seen potentially in sepsis, anemia, thyroid  disease, or obesity. FINDINGS  Left Ventricle: Left ventricular ejection fraction, by estimation, is 70 to 75%. The left ventricle has hyperdynamic function. The left ventricle has no regional wall motion abnormalities. The left ventricular internal cavity size was normal in size. There is no left ventricular hypertrophy. Left ventricular diastolic parameters were normal. Right Ventricle: The right ventricular size is normal. No increase in right ventricular wall thickness. Right ventricular systolic function is normal. Left Atrium: Left atrial size was normal in size. Right Atrium: Right atrial size was normal in size. Pericardium: There is no evidence of pericardial effusion. Mitral Valve: The mitral valve is normal in structure. Trivial mitral valve regurgitation. No evidence of mitral valve stenosis. Tricuspid Valve: The tricuspid valve is normal in structure. Tricuspid valve regurgitation is trivial. No evidence of tricuspid stenosis. Aortic Valve: The aortic valve is normal in structure. Aortic valve regurgitation is not visualized. No aortic stenosis is present. Aortic valve mean gradient measures 9.0 mmHg. Aortic valve peak gradient measures 16.6 mmHg. Aortic valve area, by VTI measures 3.11 cm. Pulmonic Valve: The pulmonic valve was grossly normal. Pulmonic valve regurgitation is not visualized. No evidence of pulmonic stenosis. Aorta: The aortic root is normal in size and structure. Venous: The inferior vena cava is normal in size with greater than 50% respiratory variability, suggesting right atrial pressure of 3 mmHg. IAS/Shunts: No atrial level shunt  detected by color flow Doppler.  LEFT VENTRICLE PLAX 2D LVIDd:         5.10 cm   Diastology LVIDs:         2.80 cm   LV e' medial:    10.40 cm/s LV PW:         0.90 cm   LV E/e' medial:  9.4 LV IVS:        1.00 cm   LV e' lateral:   11.80 cm/s LVOT diam:     2.20 cm   LV E/e' lateral: 8.2 LV SV:         105 LV SV Index:   42 LVOT Area:     3.80 cm  RIGHT VENTRICLE RV Basal diam:  3.70 cm RV Mid diam:    3.30 cm RV S prime:     20.80 cm/s TAPSE (M-mode): 1.9 cm LEFT ATRIUM             Index        RIGHT ATRIUM           Index LA Vol (A2C):   56.7 ml 22.43 ml/m  RA Area:     16.90 cm LA Vol (A4C):   78.3 ml 30.98 ml/m  RA Volume:   44.40 ml  17.56 ml/m LA Biplane Vol: 70.7 ml 27.97 ml/m  AORTIC VALVE AV Area (Vmax):    3.04 cm AV Area (Vmean):   2.88 cm AV Area (VTI):     3.11 cm AV Vmax:           204.00 cm/s AV Vmean:          144.000 cm/s AV VTI:            0.337 m AV Peak Grad:      16.6 mmHg AV Mean Grad:  9.0 mmHg LVOT Vmax:         163.00 cm/s LVOT Vmean:        109.000 cm/s LVOT VTI:          0.276 m LVOT/AV VTI ratio: 0.82  AORTA Ao Root diam: 3.00 cm MITRAL VALVE                TRICUSPID VALVE MV Area (PHT): 4.21 cm     TR Peak grad:   30.7 mmHg MV Decel Time: 180 msec     TR Vmax:        277.00 cm/s MV E velocity: 97.30 cm/s MV A velocity: 110.00 cm/s  SHUNTS MV E/A ratio:  0.88         Systemic VTI:  0.28 m                             Systemic Diam: 2.20 cm Arta Lark Electronically signed by Arta Lark Signature Date/Time: 11/06/2023/11:21:57 AM    Final    US  EKG SITE RITE Result Date: 11/06/2023 If Site Rite image not attached, placement could not be confirmed due to current cardiac rhythm.  CT CHEST ABDOMEN PELVIS W CONTRAST Result Date: 11/06/2023 CLINICAL DATA:  Altered mental status, unspecified abdominal pain, hyperbilirubinemia EXAM: CT CHEST, ABDOMEN, AND PELVIS WITH CONTRAST TECHNIQUE: Multidetector CT imaging of the chest, abdomen and pelvis was performed following  the standard protocol during bolus administration of intravenous contrast. RADIATION DOSE REDUCTION: This exam was performed according to the departmental dose-optimization program which includes automated exposure control, adjustment of the mA and/or kV according to patient size and/or use of iterative reconstruction technique. CONTRAST:  75mL OMNIPAQUE  IOHEXOL  350 MG/ML SOLN COMPARISON:  None Available. FINDINGS: CT CHEST FINDINGS Cardiovascular: No significant coronary artery calcification. Global cardiac size within normal limits. No pericardial effusion. Central pulmonary arteries are of normal caliber. Mild atherosclerotic calcification within the thoracic aorta. No aortic aneurysm. Mediastinum/Nodes: No enlarged mediastinal, hilar, or axillary lymph nodes. Thyroid  gland, trachea, and esophagus demonstrate no significant findings. Moderate hiatal hernia Lungs/Pleura: Lungs are clear. No pleural effusion or pneumothorax. Musculoskeletal: Remote appearing compression deformities T10 and T11 are noted with loss of height. No acute bone abnormality. No lytic blastic bone lesion. CT ABDOMEN PELVIS FINDINGS Hepatobiliary: Cholelithiasis without superimposed pericholecystic inflammatory change. Liver unremarkable on this noncontrast examination. No intra or extrahepatic biliary ductal dilation. Pancreas: Unremarkable Spleen: Unremarkable Adrenals/Urinary Tract: The adrenal glands are unremarkable. Immediately adjacent to the left adrenal gland is a nodular density abutting the gastric fundus and aorta likely representing a indirect splenorenal shunt and suggestive of occlusion the splenic and/or portal vein. This is not well assessed on this noncontrast examination the kidneys unremarkable. The bladder is decompressed with a Foley catheter balloon seen within its lumen. Stomach/Bowel: Varicoid densities seen within the left periaortic region likely representing mesenteric collateralization to the left renal vein and  suggestive portal vein thrombosis. The stomach, small bowel, and large bowel are otherwise. Appendix. No free intraperitoneal gas or fluid. Vascular/Lymphatic: Moderate aortoiliac atherosclerotic calcification. Varicoid densities is noted above suggesting probable thrombosis of the splenic vein and/or portal vein with portosystemic collateralization to the left renal vein. No pathologic adenopathy within the abdomen and pelvis. Reproductive: Uterus and bilateral adnexa are unremarkable. Other: Moderate diffuse subcutaneous body wall edema Musculoskeletal: Osseous structures are age-appropriate. Remote L3 compression deformity. No acute bone abnormality. IMPRESSION: 1. No acute intrathoracic pathology identified. 2. Moderate hiatal hernia. 3.  Cholelithiasis. 4. Probable thrombosis of the splenic vein and/or portal vein with portosystemic collateralization to the left renal vein as described above. This is not well assessed on this noncontrast examination. This could be better assessed with contrast enhanced CT imaging or hepatic Doppler sh 5. Moderate diffuse subcutaneous body wall edema. 6. Remote T10, T11, and L3 compression deformities. Electronically Signed   By: Worthy Heads M.D.   On: 11/06/2023 02:37   CT Head Wo Contrast Result Date: 11/06/2023 CLINICAL DATA:  Altered mental status EXAM: CT HEAD WITHOUT CONTRAST TECHNIQUE: Contiguous axial images were obtained from the base of the skull through the vertex without intravenous contrast. RADIATION DOSE REDUCTION: This exam was performed according to the departmental dose-optimization program which includes automated exposure control, adjustment of the mA and/or kV according to patient size and/or use of iterative reconstruction technique. COMPARISON:  None Available. FINDINGS: Brain: No evidence of acute infarction, hemorrhage, hydrocephalus, extra-axial collection or mass lesion/mass effect. Vascular: No hyperdense vessel or unexpected calcification.  Skull: Normal. Negative for fracture or focal lesion. Sinuses/Orbits: Right scleral banding has been performed. The left ocular globe demonstrates retinal detachment and calcification of the ocular lens in keeping with a chronic degenerative process. The paranasal sinuses are clear. Other: Mastoid air cells and middle ear cavities are clear. IMPRESSION: 1. No acute intracranial abnormality is seen. 2. 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 Electronically Signed   By: Worthy Heads M.D.   On: 11/06/2023 02:27   DG Chest Port 1 View Result Date: 11/06/2023 CLINICAL DATA:  Possible sepsis EXAM: PORTABLE CHEST 1 VIEW COMPARISON:  08/29/2013 FINDINGS: Cardiac shadow is mildly prominent but accentuated by the portable technique. Aortic calcifications are noted. Lungs are well aerated bilaterally. Patient rotation to the left accentuates the mediastinal markings. No focal infiltrate is seen. IMPRESSION: No acute abnormality noted. Electronically Signed   By: Violeta Grey M.D.   On: 11/06/2023 01:41    Labs:  CBC: Recent Labs    11/09/23 0552 11/10/23 0617 11/11/23 0500 11/12/23 0502  WBC 3.3* 3.2* 3.5* 3.7*  HGB 9.5* 9.6* 9.7* 9.9*  HCT 28.2* 28.6* 29.8* 29.6*  PLT 42* 50* 45* 49*    COAGS: Recent Labs    11/06/23 0442 11/06/23 1630 11/08/23 0620 11/12/23 0502  INR 2.1*  --  2.2* 2.0*  APTT  --  <22*  --   --     BMP: Recent Labs    11/09/23 0552 11/10/23 0617 11/11/23 0500 11/12/23 0502  NA 134* 134* 134* 136  K 3.5 3.9 3.9 4.3  CL 108 105 107 108  CO2 23 25 23 22   GLUCOSE 76 82 84 85  BUN 11 12 13 13   CALCIUM  7.6* 7.7* 7.5* 7.7*  CREATININE 0.77 0.76 0.87 0.91  GFRNONAA >60 >60 >60 >60    LIVER FUNCTION TESTS: Recent Labs    11/09/23 0552 11/10/23 0617 11/11/23 0500 11/12/23 0502  BILITOT 2.9* 2.4* 2.6* 2.6*  AST 121* 118* 109* 103*  ALT 38 39 38 37  ALKPHOS 53 61 60 59  PROT  5.2* 5.6* 5.2* 5.3*  ALBUMIN <1.5* <1.5* <1.5* <1.5*    TUMOR MARKERS: No results for input(s): "AFPTM", "CEA", "CA199", "CHROMGRNA" in the last 8760 hours.  Assessment and Plan:  Scheduled for Bone marrow biopsy in IR 4/23 Risks and benefits of Bone marrow biopsy was discussed with the patient and/or patient's family including, but not limited to bleeding,  infection, damage to adjacent structures or low yield requiring additional tests.  All of the questions were answered and there is agreement to proceed.  Consent signed and in chart.  Thank you for this interesting consult.  I greatly enjoyed meeting Brianna Wheeler and look forward to participating in their care.  A copy of this report was sent to the requesting provider on this date.  Electronically Signed: Ellen Guppy, PA-C 11/12/2023, 8:23 AM   I spent a total of 20 Minutes    in face to face in clinical consultation, greater than 50% of which was counseling/coordinating care for bone marrow biopsy

## 2023-11-12 NOTE — Assessment & Plan Note (Deleted)
 TSH elevated. Will order T4 to follow up - T4 pending - will restart home meds when able to PO

## 2023-11-12 NOTE — Assessment & Plan Note (Addendum)
 Stable. Anit- SM negative. Mitochondrial IgG <20.  -Hematology following, continue to hold DVT prophylaxis -Pending multiple labs:  - hemochromatosis  - MM labs  - UPEP  - ANA  - EPO - EGD prior to discharge, scheduled for tomorrow - AM CBC, CMP

## 2023-11-12 NOTE — Progress Notes (Addendum)
 Patient ID: Brianna Wheeler, female   DOB: [birth year], 68 y.o.   MRN: [id number]    Progress Note   Subjective   Day # 7 CC; new dx of decompensated cirrhosis with altered mental status/sepsis/UTI, new finding of portal vein thrombosis-not anticoagulating  MELD-Na=22  Labs today pro time 22.9/INR 2 WBC 3.7/hemoglobin 9.9/hematocrit 29.6/platelets 49 Potassium 4.3/BUN 13/creatinine 0.91 T. bili 2.6/AST 103  EGD which was scheduled for today had to be bumped due to emergency, patient has eaten lunch, no new complaints today, continues to complain of some mild dysphagia which she seems to think that fruit aggravates.  Mentions that she is also scheduled for a bone marrow biopsy tomorrow morning     Objective   Vital signs in last 24 hours: Temp:  [97.6 F (36.4 C)-98 F (36.7 C)] 97.6 F (36.4 C) (04/22 0748) Pulse Rate:  [64-65] 65 (04/22 0430) Resp:  [16-18] 18 (04/22 0748) BP: (108-115)/(52-54) 115/54 (04/22 0430) SpO2:  [97 %] 97 % (04/22 0430) Last BM Date : 11/10/23 General:   Older white female in NAD chronically ill-appearing, obese, son at bedside Heart:  Regular rate and rhythm; no murmurs Lungs: Respirations even and unlabored, lungs CTA bilaterally Abdomen:  Soft, morbidly obese, no appreciable fluid wave, nontender normal bowel sounds. Extremities: Plus edema bilateral lower extremities Neurologic:  Alert and oriented,  grossly normal neurologically.  Mentating well Psych:  Cooperative. Normal mood and affect.  Intake/Output from previous day: 04/21 0701 - 04/22 0700 In: 360 [P.O.:360] Out: 1100 [Urine:1100] Intake/Output this shift: No intake/output data recorded.  Lab Results: Recent Labs    11/10/23 0617 11/11/23 0500 11/12/23 0502  WBC 3.2* 3.5* 3.7*  HGB 9.6* 9.7* 9.9*  HCT 28.6* 29.8* 29.6*  PLT 50* 45* 49*   BMET Recent Labs    11/10/23 0617 11/11/23 0500 11/12/23 0502  NA 134* 134* 136  K 3.9 3.9 4.3  CL 105 107 108  CO2 25 23 22    GLUCOSE 82 84 85  BUN 12 13 13   CREATININE 0.76 0.87 0.91  CALCIUM  7.7* 7.5* 7.7*   LFT Recent Labs    11/12/23 0502  PROT 5.3*  ALBUMIN <1.5*  AST 103*  ALT 37  ALKPHOS 59  BILITOT 2.6*   PT/INR Recent Labs    11/12/23 0502  LABPROT 22.9*  INR 2.0*         Assessment / Plan:    #44 68 year old white female admitted with altered mental status and sepsis secondary to E. coli UTI, also found with workup on admission to have new diagnosis of decompensated cirrhosis likely NASH  Autoimmune markers negative IgG 3446 Ceruloplasmin 13.6 low Hemochromatosis DNA pending  To have had a component of hepatic encephalopathy versus metabolic encephalopathy on admission. Mental status has returned to baseline  MELD NA=24  #2 new finding portal vein thrombosis likely chronic hematology does not recommend anticoagulation #3 anemia, no iron deficiency-likely multifactoral Patient is scheduled for bone marrow biopsy tomorrow  #4 morbid obesity #5 severe hypoalbuminemia  Plan; n.p.o. after midnight Patient has been rescheduled for EGD in a.m. tomorrow, we can work around the timing of bone marrow biopsy     Principal Problem:   E. coli UTI (urinary tract infection) Active Problems:   Hypothyroidism   Morbid obesity (HCC)   Acute metabolic encephalopathy   Chronic health problem   Hyperbilirubinemia with Jaundice   Portal venous thrombus   Swelling of both lower extremities   Portal hypertension (HCC)   Possible  Portal vein/Splenic vein thrombosis on CT   Anasarca   Thrombocytopenia (HCC)   Gammopathy, monoclonal   Systolic murmur   Pressure injury of buttock, stage 1   Cirrhosis of liver without ascites (HCC)   Hyperphosphatemia   High serum transferrin saturation   Hepatic cirrhosis (HCC)   Vitamin D  deficiency     LOS: 6 days   Amy Esterwood PA-C 11/12/2023, 1:04 PM   Attending physician's note   I personally saw the patient and performed a  substantive portion of this encounter (>50% time spent), including a complete performance of at least one of the key components (MDM, Hx and/or Exam), in conjunction with the APP.  I agree with the APP's note, impression, and recommendations with additional input as follows.     New diagnosis of decompensated Brianna Wheeler cirrhosis MELD 22 Plan for EGD tomorrow for variceal screening N.p.o. after midnight     K. Veena Megon Kalina , MD [phone]

## 2023-11-12 NOTE — H&P (View-Only) (Signed)
 Patient ID: Brianna Wheeler, female   DOB: [birth year], 68 y.o.   MRN: [id number]    Progress Note   Subjective   Day # 7 CC; new dx of decompensated cirrhosis with altered mental status/sepsis/UTI, new finding of portal vein thrombosis-not anticoagulating  MELD-Na=22  Labs today pro time 22.9/INR 2 WBC 3.7/hemoglobin 9.9/hematocrit 29.6/platelets 49 Potassium 4.3/BUN 13/creatinine 0.91 T. bili 2.6/AST 103  EGD which was scheduled for today had to be bumped due to emergency, patient has eaten lunch, no new complaints today, continues to complain of some mild dysphagia which she seems to think that fruit aggravates.  Mentions that she is also scheduled for a bone marrow biopsy tomorrow morning     Objective   Vital signs in last 24 hours: Temp:  [97.6 F (36.4 C)-98 F (36.7 C)] 97.6 F (36.4 C) (04/22 0748) Pulse Rate:  [64-65] 65 (04/22 0430) Resp:  [16-18] 18 (04/22 0748) BP: (108-115)/(52-54) 115/54 (04/22 0430) SpO2:  [97 %] 97 % (04/22 0430) Last BM Date : 11/10/23 General:   Older white female in NAD chronically ill-appearing, obese, son at bedside Heart:  Regular rate and rhythm; no murmurs Lungs: Respirations even and unlabored, lungs CTA bilaterally Abdomen:  Soft, morbidly obese, no appreciable fluid wave, nontender normal bowel sounds. Extremities: Plus edema bilateral lower extremities Neurologic:  Alert and oriented,  grossly normal neurologically.  Mentating well Psych:  Cooperative. Normal mood and affect.  Intake/Output from previous day: 04/21 0701 - 04/22 0700 In: 360 [P.O.:360] Out: 1100 [Urine:1100] Intake/Output this shift: No intake/output data recorded.  Lab Results: Recent Labs    11/10/23 0617 11/11/23 0500 11/12/23 0502  WBC 3.2* 3.5* 3.7*  HGB 9.6* 9.7* 9.9*  HCT 28.6* 29.8* 29.6*  PLT 50* 45* 49*   BMET Recent Labs    11/10/23 0617 11/11/23 0500 11/12/23 0502  NA 134* 134* 136  K 3.9 3.9 4.3  CL 105 107 108  CO2 25 23 22    GLUCOSE 82 84 85  BUN 12 13 13   CREATININE 0.76 0.87 0.91  CALCIUM  7.7* 7.5* 7.7*   LFT Recent Labs    11/12/23 0502  PROT 5.3*  ALBUMIN <1.5*  AST 103*  ALT 37  ALKPHOS 59  BILITOT 2.6*   PT/INR Recent Labs    11/12/23 0502  LABPROT 22.9*  INR 2.0*         Assessment / Plan:    #44 68 year old white female admitted with altered mental status and sepsis secondary to E. coli UTI, also found with workup on admission to have new diagnosis of decompensated cirrhosis likely NASH  Autoimmune markers negative IgG 3446 Ceruloplasmin 13.6 low Hemochromatosis DNA pending  To have had a component of hepatic encephalopathy versus metabolic encephalopathy on admission. Mental status has returned to baseline  MELD NA=24  #2 new finding portal vein thrombosis likely chronic hematology does not recommend anticoagulation #3 anemia, no iron deficiency-likely multifactoral Patient is scheduled for bone marrow biopsy tomorrow  #4 morbid obesity #5 severe hypoalbuminemia  Plan; n.p.o. after midnight Patient has been rescheduled for EGD in a.m. tomorrow, we can work around the timing of bone marrow biopsy     Principal Problem:   E. coli UTI (urinary tract infection) Active Problems:   Hypothyroidism   Morbid obesity (HCC)   Acute metabolic encephalopathy   Chronic health problem   Hyperbilirubinemia with Jaundice   Portal venous thrombus   Swelling of both lower extremities   Portal hypertension (HCC)   Possible  Portal vein/Splenic vein thrombosis on CT   Anasarca   Thrombocytopenia (HCC)   Gammopathy, monoclonal   Systolic murmur   Pressure injury of buttock, stage 1   Cirrhosis of liver without ascites (HCC)   Hyperphosphatemia   High serum transferrin saturation   Hepatic cirrhosis (HCC)   Vitamin D  deficiency     LOS: 6 days   Amy Esterwood PA-C 11/12/2023, 1:04 PM   Attending physician's note   I personally saw the patient and performed a  substantive portion of this encounter (>50% time spent), including a complete performance of at least one of the key components (MDM, Hx and/or Exam), in conjunction with the APP.  I agree with the APP's note, impression, and recommendations with additional input as follows.     New diagnosis of decompensated Brianna Wheeler cirrhosis MELD 22 Plan for EGD tomorrow for variceal screening N.p.o. after midnight     K. Veena Megon Kalina , MD [phone]

## 2023-11-12 NOTE — Assessment & Plan Note (Deleted)
 ECHO hyperdynamic. Likely flow murmur.

## 2023-11-12 NOTE — Progress Notes (Signed)
 Mobility Specialist Progress Note;   11/12/23 0940  Mobility  Activity Stood at bedside (Chair)  Level of Assistance Maximum assist, patient does 25-49%  Nurse, learning disability of Motion/Exercises Active Assistive;Left arm;Right leg;Left leg  Activity Response Tolerated fair  Mobility Referral Yes  Mobility visit 1 Mobility  Mobility Specialist Start Time (ACUTE ONLY) 0940  Mobility Specialist Stop Time (ACUTE ONLY) 0956  Mobility Specialist Time Calculation (min) (ACUTE ONLY) 16 min   Pt in chair upon arrival, agreeable to mobility. Stated she was limited d/t sciatic pain and pain in L armpit area. Attempted to stand from recliner, requiring MaxA however pt barely able to clear bottom from chair. Attempted 2x after, still unable to stand. Pt agreeable to chair level exercises for LUE and BLE. VSS throughout. Pt left comfortably in chair with all needs met. Son in room.   Janit Meline Mobility Specialist Please contact via SecureChat or [employer] [phone]

## 2023-11-12 NOTE — Assessment & Plan Note (Deleted)
 Vit D deficient. Both Vit D, 25 hydroxy and Vit D, 1, 25 Hydroxy - will start patient on daily Vit D and have patient follow up Vit D 1,25 Hydroxy outpatient to follow up to ensure no associated renal disease causing Vit D deficiency

## 2023-11-12 NOTE — Assessment & Plan Note (Deleted)
 Continues to be elevated. Given discussion with heme/onc and nephrology and they are not concerned for this as patients mentation is appropriate. Will stop following

## 2023-11-12 NOTE — Progress Notes (Signed)
 Ms. Lamos is now of the ICU.  She is up on 6 E.  She is going to have a upper endoscopy today I think.  A possibly confounding issue is the fact that she does have significant protein abnormality.  We are following up for the possibility of a plasmacytic disorder.  I suppose 1 remote possibility that could explain all of this would be amyloidosis.  I do not have back her 24-hour urine.  I do not have back or SPEP.  Her lambda light chain is slightly elevated at 11.1 mg/dL.  Her IgG level is 3446 mg/dL.  I do think that this somewhat in situations where a bone marrow biopsy will help us  out.  I really think this needs to be done with her as an inpatient.  I talked her she and her son about this this morning.  I explained why I thought a bone marrow can help us  out.  She is in agreement.  Her labs this morning show white cell count of 3.7.  Hemoglobin 9.9.  Platelet count 49,000.  Her chemistry studies show sodium 136.  Potassium 4.3.  BUN 13 creatinine 0.9.  Calcium  7.7 with an albumin of less than 1.5.  Her bilirubin is 2.6.  Her SGPT 37 SGOT 103.  Again, it is possible that she may need to have a actual liver biopsy.  I know with her platelet count is low it is, this might be a little bit challenging.  We will continue to try to help as much as we can.  Noted that her ceruloplasmin level was on the low side.  Her hemochromatosis assay is still pending.  Hopefully, the bone marrow biopsy can be done either today or tomorrow.  I know that she will get incredible care from everybody upon 6 E.   Rayleen Cal, MD  Lina Render [phone]

## 2023-11-12 NOTE — Assessment & Plan Note (Signed)
>>  ASSESSMENT AND PLAN FOR PORTAL VENOUS THROMBUS WRITTEN ON 11/12/2023  8:59 AM BY Farris Hong, MD  Stable. Anit- SM negative. Mitochondrial IgG <20.  -Hematology following, continue to hold DVT prophylaxis -Pending multiple labs:  - hemochromatosis  - MM labs  - UPEP  - ANA  - EPO - EGD prior to discharge, scheduled for tomorrow - AM CBC, CMP

## 2023-11-12 NOTE — TOC Transition Note (Signed)
 Transition of Care [facility]) - Discharge Note   Patient Details  Name: Brianna Wheeler MRN: [id number] Date of Birth: 08/10/1955  Transition of Care [facility]) CM/SW Contact:  Cosimo Diones, RN Phone Number: 11/12/2023, 4:06 PM   Clinical Narrative:  Patient presented for sepsis and acute encephalopathy. PTA patient was from home with son. Patient has DME rollator in the home. Case Manager discussed with patient regarding disposition plan and she wants to return home because she is the caregiver for her son. Patient states she will be able to increase her mobility once she gets home. Patient is adamantly refusing SNF. Patient will need to work with Physical Therapy more before she can have a safe discharge home. If the plan continues to be home; [facility] services will need to be maxed out. Secure chat sent to MD regarding if patient has capacity to continue to make decisions. Per CSW, APS is following the patient. Case Manager will continue to follow for transition of care needs as the patient progresses.    Barriers to Discharge: Continued Medical Work up  Discharge Plan and Services Additional resources added to the After Visit Summary for   In-house Referral: Clinical Social Work Discharge Planning Services: CM Consult              DME Agency: NA   Social Drivers of Health (SDOH) Interventions SDOH Screenings   Food Insecurity: No Food Insecurity (11/07/2023)  Housing: Low Risk  (11/07/2023)  Transportation Needs: No Transportation Needs (11/07/2023)  Utilities: Not At Risk (11/07/2023)  Alcohol Screen: Low Risk  (09/30/2023)  Depression (PHQ2-9): Low Risk  (09/30/2023)  Financial Resource Strain: Low Risk  (09/30/2023)  Physical Activity: Insufficiently Active (09/30/2023)  Social Connections: Moderately Integrated (11/07/2023)  Stress: No Stress Concern Present (09/30/2023)  Tobacco Use: Low Risk  (11/06/2023)  Health Literacy: Adequate Health Literacy (09/30/2023)    Readmission Risk  Interventions     No data to display

## 2023-11-12 NOTE — Progress Notes (Signed)
     Daily Progress Note Intern Pager: [phone]  Patient name: Brianna Wheeler Medical record number: [id number] Date of birth: Jun 15, 1956 Age: 68 y.o. Gender: female  Primary Care Provider: Kandis Ormond, DO Consultants: Heme/Onc, GI  Code Status: Full   Pt Overview and Major Events to Date:  4/16: admitted   Assessment and Plan: Patient is a 68 year old female with past medical history of diabetes, hypertension, hypothyroidism admitted for altered mental status secondary to urosepsis hepatic encephalopathy. Assessment & Plan Acute metabolic encephalopathy Patient is at baseline.  1.1 L output documented overnight. - Appreciate GI recs - Continue lactulose  twice daily daily - Will consider reducing Lasix  today - Fall precautions - AM CBC, CMP Gammopathy, monoclonal Labs were significant for elevated IgG, mildly elevated lambda light chains.  Hematology is following.  Per this morning's note, concern for amyloidosis versus other plasmacytic disorder. - Bone marrow biopsy scheduled with IR for 4/23 -Hematology following, appreciate recs Portal venous thrombus Stable. Anit- SM negative. Mitochondrial IgG <20.  -Hematology following, continue to hold DVT prophylaxis -Pending multiple labs:  - hemochromatosis  - MM labs  - UPEP  - ANA  - EPO - EGD prior to discharge, scheduled for tomorrow - AM CBC, CMP   E. coli UTI (urinary tract infection) Continue to treat wit keflex , total course of 7 days ( 4/17 - 4/23)  Chronic health problem DMII: stable glucose. Continue CBG checks BID HTN: holding ISO of normotensive BPDMII: A1C 4.3, glucoses have been stable. Will decrease CBG checks to BID HLD: holding statin Hypothyroidism: continue synthroid  Pain: continue home tramadol   Vit D deficiency: supplementing with daily vit D. Will need follow up of vit D levels outpatient   FEN/GI: Regular, NPO at [place] PPx: SCDs Dispo: PT/OT recommend SNF, though likely home d/t caring for  disabled son   Subjective:  NAD, sitting in chair comfortably   Objective: Temp:  [97.6 F (36.4 C)-98 F (36.7 C)] 97.6 F (36.4 C) (04/22 0430) Pulse Rate:  [62-70] 65 (04/22 0430) Resp:  [16-19] 18 (04/22 0430) BP: (108-130)/(52-61) 115/54 (04/22 0430) SpO2:  [97 %-98 %] 97 % (04/22 0430) Physical Exam: General: A&Ox3 Cardiovascular: RRR Respiratory: NWOB on RA Abdomen: obese, NTTP Extremities: 2+ pitting edema to bilateral lower extremities, seems improved from yesterday   Laboratory: Most recent CBC Lab Results  Component Value Date   WBC 3.7 (L) 11/12/2023   HGB 9.9 (L) 11/12/2023   HCT 29.6 (L) 11/12/2023   MCV 106.5 (H) 11/12/2023   PLT 49 (L) 11/12/2023   Most recent BMP    Latest Ref Rng & Units 11/12/2023    5:02 AM  BMP  Glucose 70 - 99 mg/dL 85   BUN 8 - 23 mg/dL 13   Creatinine 1.47 - 1.00 mg/dL 8.29   Sodium 562 - 130 mmol/L 136   Potassium 3.5 - 5.1 mmol/L 4.3   Chloride 98 - 111 mmol/L 108   CO2 22 - 32 mmol/L 22   Calcium  8.9 - 10.3 mg/dL 7.7      Imaging/Diagnostic Tests: No new imaging  Farris Hong, MD 11/12/2023, 7:11 AM  PGY-1, [facility] FPTS Intern pager: [phone], text pages welcome Secure chat group [facility] Teaching Service

## 2023-11-13 ENCOUNTER — Encounter (HOSPITAL_COMMUNITY): Payer: Self-pay | Admitting: Family Medicine

## 2023-11-13 ENCOUNTER — Inpatient Hospital Stay (HOSPITAL_COMMUNITY)

## 2023-11-13 ENCOUNTER — Encounter (HOSPITAL_COMMUNITY)
Admission: EM | Disposition: A | Discharge status: Skilled Nursing Facility | Payer: Self-pay | Source: Home / Self Care | Attending: Family Medicine

## 2023-11-13 DIAGNOSIS — J45909 Unspecified asthma, uncomplicated: Secondary | ICD-10-CM

## 2023-11-13 DIAGNOSIS — R601 Generalized edema: Secondary | ICD-10-CM | POA: Diagnosis not present

## 2023-11-13 DIAGNOSIS — K2970 Gastritis, unspecified, without bleeding: Secondary | ICD-10-CM

## 2023-11-13 DIAGNOSIS — K449 Diaphragmatic hernia without obstruction or gangrene: Secondary | ICD-10-CM | POA: Diagnosis not present

## 2023-11-13 DIAGNOSIS — K3189 Other diseases of stomach and duodenum: Secondary | ICD-10-CM | POA: Diagnosis not present

## 2023-11-13 DIAGNOSIS — I10 Essential (primary) hypertension: Secondary | ICD-10-CM | POA: Diagnosis not present

## 2023-11-13 DIAGNOSIS — K766 Portal hypertension: Secondary | ICD-10-CM | POA: Diagnosis not present

## 2023-11-13 HISTORY — PX: BIOPSY OF SKIN SUBCUTANEOUS TISSUE AND/OR MUCOUS MEMBRANE: SHX6741

## 2023-11-13 HISTORY — PX: ESOPHAGOGASTRODUODENOSCOPY: SHX5428

## 2023-11-13 LAB — COMPREHENSIVE METABOLIC PANEL WITH GFR
ALT: 38 U/L (ref 0–44)
AST: 101 U/L — ABNORMAL HIGH (ref 15–41)
Albumin: <1.5 g/dL — ABNORMAL LOW (ref 3.5–5.0)
Alkaline Phosphatase: 54 U/L (ref 38–126)
Anion gap: 4 — ABNORMAL LOW (ref 5–15)
BUN: 13 mg/dL (ref 8–23)
CO2: 26 mmol/L (ref 22–32)
Calcium: 8 mg/dL — ABNORMAL LOW (ref 8.9–10.3)
Chloride: 107 mmol/L (ref 98–111)
Creatinine, Ser: 0.84 mg/dL (ref 0.44–1.00)
GFR, Estimated: >60 mL/min (ref >60)
Glucose, Bld: 80 mg/dL (ref 70–99)
Potassium: 4.2 mmol/L (ref 3.5–5.1)
Sodium: 137 mmol/L (ref 135–145)
Total Bilirubin: 3.1 mg/dL — ABNORMAL HIGH (ref 0.0–1.2)
Total Protein: 5.3 g/dL — ABNORMAL LOW (ref 6.5–8.1)

## 2023-11-13 LAB — CBC WITH DIFFERENTIAL/PLATELET
Abs Immature Granulocytes: 0.06 10*3/uL (ref 0.00–0.07)
Basophils Absolute: 0.1 10*3/uL (ref 0.0–0.1)
Basophils Relative: 2 %
Eosinophils Absolute: 0.3 10*3/uL (ref 0.0–0.5)
Eosinophils Relative: 11 %
HCT: 28 % — ABNORMAL LOW (ref 36.0–46.0)
Hemoglobin: 9.2 g/dL — ABNORMAL LOW (ref 12.0–15.0)
Immature Granulocytes: 2 %
Lymphocytes Relative: 33 %
Lymphs Abs: 0.8 10*3/uL (ref 0.7–4.0)
MCH: 35.1 pg — ABNORMAL HIGH (ref 26.0–34.0)
MCHC: 32.9 g/dL (ref 30.0–36.0)
MCV: 106.9 fL — ABNORMAL HIGH (ref 80.0–100.0)
Monocytes Absolute: 0.3 10*3/uL (ref 0.1–1.0)
Monocytes Relative: 11 %
Neutro Abs: 1 10*3/uL — ABNORMAL LOW (ref 1.7–7.7)
Neutrophils Relative %: 41 %
Platelets: 43 10*3/uL — ABNORMAL LOW (ref 150–400)
RBC: 2.62 MIL/uL — ABNORMAL LOW (ref 3.87–5.11)
RDW: 18.5 % — ABNORMAL HIGH (ref 11.5–15.5)
Smear Review: DECREASED
WBC: 2.5 10*3/uL — ABNORMAL LOW (ref 4.0–10.5)
nRBC: 0 % (ref 0.0–0.2)

## 2023-11-13 LAB — ERYTHROPOIETIN: Erythropoietin: 42.8 m[IU]/mL — ABNORMAL HIGH (ref 2.6–18.5)

## 2023-11-13 LAB — GLUCOSE, CAPILLARY
Glucose-Capillary: 71 mg/dL (ref 70–99)
Glucose-Capillary: 69 mg/dL — ABNORMAL LOW (ref 70–99)
Glucose-Capillary: 76 mg/dL (ref 70–99)

## 2023-11-13 LAB — PROTIME-INR
INR: 2 — ABNORMAL HIGH (ref 0.8–1.2)
Prothrombin Time: 22.9 s — ABNORMAL HIGH (ref 11.4–15.2)

## 2023-11-13 LAB — PHOSPHORUS: Phosphorus: >30 mg/dL — ABNORMAL HIGH (ref 2.5–4.6)

## 2023-11-13 SURGERY — EGD (ESOPHAGOGASTRODUODENOSCOPY)
Anesthesia: Monitor Anesthesia Care

## 2023-11-13 MED ORDER — SODIUM CHLORIDE 0.9 % IV SOLN
INTRAVENOUS | Status: AC | PRN
  Administered 2023-11-13: 500 mL via INTRAMUSCULAR

## 2023-11-13 MED ORDER — FENTANYL CITRATE (PF) 100 MCG/2ML IJ SOLN
INTRAMUSCULAR | Status: AC
  Filled 2023-11-13: qty 2

## 2023-11-13 MED ORDER — LIDOCAINE HCL 1 % IJ SOLN
10.0000 mL | Freq: Once | INTRAMUSCULAR | Status: AC
  Administered 2023-11-13: 10 mL via INTRADERMAL

## 2023-11-13 MED ORDER — LIDOCAINE 2% (20 MG/ML) 5 ML SYRINGE
INTRAMUSCULAR | Status: DC | PRN
  Administered 2023-11-13: 100 mg via INTRAVENOUS

## 2023-11-13 MED ORDER — MIDAZOLAM HCL 2 MG/2ML IJ SOLN
INTRAMUSCULAR | Status: AC | PRN
  Administered 2023-11-13: 1 mg via INTRAVENOUS

## 2023-11-13 MED ORDER — FENTANYL CITRATE (PF) 100 MCG/2ML IJ SOLN
INTRAMUSCULAR | Status: AC | PRN
  Administered 2023-11-13: 50 ug via INTRAVENOUS
  Administered 2023-11-13: 25 ug via INTRAVENOUS

## 2023-11-13 MED ORDER — MIDAZOLAM HCL 2 MG/2ML IJ SOLN
INTRAMUSCULAR | Status: AC
Start: 2023-11-13 — End: ?
  Filled 2023-11-13: qty 2

## 2023-11-13 MED ORDER — PROPOFOL 10 MG/ML IV BOLUS
INTRAVENOUS | Status: DC | PRN
  Administered 2023-11-13: 100 ug/kg/min via INTRAVENOUS
  Administered 2023-11-13: 30 mg via INTRAVENOUS

## 2023-11-13 NOTE — Interval H&P Note (Signed)
 History and Physical Interval Note:  11/13/2023 10:11 AM  Brianna Wheeler  has presented today for surgery, with the diagnosis of cirrhosis,varices screening.  The various methods of treatment have been discussed with the patient and family. After consideration of risks, benefits and other options for treatment, the patient has consented to  Procedure(s): EGD (ESOPHAGOGASTRODUODENOSCOPY) (N/A) as a surgical intervention.  The patient's history has been reviewed, patient examined, no change in status, stable for surgery.  I have reviewed the patient's chart and labs.  Questions were answered to the patient's satisfaction.     Taariq Leitz

## 2023-11-13 NOTE — Progress Notes (Signed)
 Daily Progress Note Intern Pager: [phone]  Patient name: Brianna Wheeler Medical record number: [id number] Date of birth: 07-13-1956 Age: 68 y.o. Gender: female  Primary Care Provider: Kandis Ormond, DO Consultants: Heme/Onc, GI Code Status: Full   Pt Overview and Major Events to Date:  4/16: admitted 4/23: tentative plan for EGD and BMA   Assessment and Plan: 68 year old female with past medical history of diabetes, hypertension, hypothyroidism admitted for altered mental status likely secondary to urosepsis versus hepatic encephalopathy.  Patient is now at baseline.  Workup is continuing for patient's abnormal lab work, hematology following. Assessment & Plan Acute metabolic encephalopathy Resolved. At Baseline. - GI following, appreciate recs - continue 20 mg lactulose  BID - fall precautions - AM CBC, CMP   Gammopathy, monoclonal Hematology following. Concern for amyloidosis vs other plasmacytic disorder. - Hematology following, appreciate recs - bone marrow biopsy planned for today - Hemachromatosis labs pending Portal venous thrombus Remains stable. - EGD prior to discharge, tentatively scheduled for today   E. coli UTI (urinary tract infection) No current sxs. Patient finished course of keflex  ( 4/16 - 4/22)  Chronic health problem DMII: stable, continue cbg checks BID HTN: holding home meds ISO normotensive BP HLD: holding statin Hypothyroidism: continue synthroid  Pain: continue home tramadol  Vit D Deficiency: supplementing with daily vit D, will need outpatient vit D levels outpatient   FEN/GI: NPO pending EGD, bone marrow biopsy  PPx: holding ISO low platelet/ increased bleed risk  Dispo: pending further conversation with family. PT/OT recommend SNF, though patient cannot leave her son alone given intellectual delay   Subjective:  Not seen, getting bone marrow biopsy done   Objective: Temp:  [97.5 F (36.4 C)-97.8 F (36.6 C)] 97.5 F (36.4 C)  (04/23 0309) Pulse Rate:  [66-67] 67 (04/23 0309) Resp:  [18-20] 18 (04/23 0309) BP: (118-127)/(55-56) 118/55 (04/23 0309) SpO2:  [98 %-99 %] 98 % (04/23 0309) Physical Exam: Not done as patient getting bone marrow biopsy   Laboratory: Most recent CBC Lab Results  Component Value Date   WBC 2.5 (L) 11/13/2023   HGB 9.2 (L) 11/13/2023   HCT 28.0 (L) 11/13/2023   MCV 106.9 (H) 11/13/2023   PLT 43 (L) 11/13/2023   Most recent BMP    Latest Ref Rng & Units 11/13/2023    4:15 AM 11/12/2023    5:02 AM 11/11/2023    5:00 AM  CMP  Glucose 70 - 99 mg/dL 80  85  84   BUN 8 - 23 mg/dL 13  13  13    Creatinine 0.44 - 1.00 mg/dL 6.96  2.95  2.84   Sodium 135 - 145 mmol/L 137  136  134   Potassium 3.5 - 5.1 mmol/L 4.2  4.3  3.9   Chloride 98 - 111 mmol/L 107  108  107   CO2 22 - 32 mmol/L 26  22  23    Calcium  8.9 - 10.3 mg/dL 8.0  7.7  7.5   Total Protein 6.5 - 8.1 g/dL 5.3  5.3  5.2   Total Bilirubin 0.0 - 1.2 mg/dL 3.1  2.6  2.6   Alkaline Phos 38 - 126 U/L 54  59  60   AST 15 - 41 U/L 101  103  109   ALT 0 - 44 U/L 38  37  38      Imaging/Diagnostic Tests: No new imaging  Derril Flint Celsey Asselin, MD 11/13/2023, 7:15 AM  PGY-1, [facility] FPTS Intern  pager: [phone], text pages welcome Secure chat group [facility]

## 2023-11-13 NOTE — Anesthesia Preprocedure Evaluation (Signed)
 Anesthesia Evaluation  Patient identified by MRN, date of birth, ID band Patient awake    Reviewed: Allergy & Precautions, NPO status , Patient's Chart, lab work & pertinent test results  History of Anesthesia Complications Negative for: history of anesthetic complications  Airway Mallampati: III  TM Distance: >3 FB Neck ROM: Full    Dental  (+) Poor Dentition, Missing   Pulmonary asthma , neg recent URI   breath sounds clear to auscultation       Cardiovascular hypertension, Pt. on medications  Rhythm:Regular  1. Left ventricular ejection fraction, by estimation, is 70 to 75%. The  left ventricle has hyperdynamic function. The left ventricle has no  regional wall motion abnormalities. Left ventricular diastolic parameters  were normal.   2. Right ventricular systolic function is normal. The right ventricular  size is normal.   3. The mitral valve is normal in structure. Trivial mitral valve  regurgitation. No evidence of mitral stenosis.   4. The aortic valve is normal in structure. Aortic valve regurgitation is  not visualized. No aortic stenosis is present.   5. The inferior vena cava is normal in size with greater than 50%  respiratory variability, suggesting right atrial pressure of 3 mmHg.     Neuro/Psych  Headaches PSYCHIATRIC DISORDERS  Depression     Neuromuscular disease    GI/Hepatic hiatal hernia,GERD  Medicated,,(+) Cirrhosis       Lab Results      Component                Value               Date                      ALT                      38                  11/13/2023                AST                      101 (H)             11/13/2023                GGT                      18                  11/06/2023                ALKPHOS                  54                  11/13/2023                BILITOT                  3.1 (H)             11/13/2023          '   Endo/Other  diabetesHypothyroidism  Lab  Results      Component                Value  Date                      HGBA1C                   4.3 (L)             11/06/2023             Renal/GU negative Renal ROSLab Results      Component                Value               Date                      NA                       137                 11/13/2023                K                        4.2                 11/13/2023                CO2                      26                  11/13/2023                GLUCOSE                  80                  11/13/2023                BUN                      13                  11/13/2023                CREATININE               0.84                11/13/2023                CALCIUM                   8.0 (L)             11/13/2023                EGFR                     68                  02/25/2023                GFRNONAA                 >60                 11/13/2023  Musculoskeletal  (+) Arthritis ,    Abdominal   Peds  Hematology  (+) Blood dyscrasia, anemia Lab Results      Component                Value               Date                      WBC                      2.5 (L)             11/13/2023                HGB                      9.2 (L)             11/13/2023                HCT                      28.0 (L)            11/13/2023                MCV                      106.9 (H)           11/13/2023                PLT                      43 (L)              11/13/2023              Anesthesia Other Findings   Reproductive/Obstetrics                              Anesthesia Physical Anesthesia Plan  ASA: 3  Anesthesia Plan: MAC   Post-op Pain Management: Minimal or no pain anticipated   Induction: Intravenous  PONV Risk Score and Plan: 2 and Propofol  infusion and Treatment may vary due to age or medical condition  Airway Management Planned: Nasal Cannula, Natural Airway and Simple Face Mask  Additional  Equipment: None  Intra-op Plan:   Post-operative Plan:   Informed Consent: I have reviewed the patients History and Physical, chart, labs and discussed the procedure including the risks, benefits and alternatives for the proposed anesthesia with the patient or authorized representative who has indicated his/her understanding and acceptance.     Dental advisory given  Plan Discussed with: CRNA  Anesthesia Plan Comments:          Anesthesia Quick Evaluation

## 2023-11-13 NOTE — Procedures (Signed)
Interventional Radiology Procedure Note  Procedure: CT guided aspirate and core biopsy of right iliac bone  Complications: None  Recommendations: - Bedrest supine x 1 hrs - Follow biopsy results   Makala Fetterolf, MD   

## 2023-11-13 NOTE — Assessment & Plan Note (Deleted)
 Vit D deficient. Both Vit D, 25 hydroxy and Vit D, 1, 25 Hydroxy - will start patient on daily Vit D and have patient follow up Vit D 1,25 Hydroxy outpatient to follow up to ensure no associated renal disease causing Vit D deficiency

## 2023-11-13 NOTE — Anesthesia Postprocedure Evaluation (Signed)
 Anesthesia Post Note  Patient: Brianna Wheeler  Procedure(s) Performed: EGD (ESOPHAGOGASTRODUODENOSCOPY) BIOPSY, SKIN, SUBCUTANEOUS TISSUE, OR MUCOUS MEMBRANE     Patient location during evaluation: Endoscopy Anesthesia Type: MAC Level of consciousness: awake and alert Pain management: pain level controlled Vital Signs Assessment: post-procedure vital signs reviewed and stable Respiratory status: spontaneous breathing, nonlabored ventilation and respiratory function stable Cardiovascular status: stable and blood pressure returned to baseline Postop Assessment: no apparent nausea or vomiting Anesthetic complications: no   There were no known notable events for this encounter.  Last Vitals:  Vitals:   11/13/23 1130 11/13/23 1145  BP: 113/67 (!) 110/46  Pulse:  66  Resp:  14  Temp:  36.6 C  SpO2:  98%    Last Pain:  Vitals:   11/13/23 1145  TempSrc: Oral  PainSc:                  [name]

## 2023-11-13 NOTE — Transfer of Care (Signed)
 Immediate Anesthesia Transfer of Care Note  Patient: Brianna Wheeler  Procedure(s) Performed: EGD (ESOPHAGOGASTRODUODENOSCOPY) BIOPSY, SKIN, SUBCUTANEOUS TISSUE, OR MUCOUS MEMBRANE  Patient Location: PACU and Endoscopy Unit  Anesthesia Type:MAC  Level of Consciousness: awake, alert , and oriented  Airway & Oxygen Therapy: Patient Spontanous Breathing  Post-op Assessment: Report given to RN and Post -op Vital signs reviewed and stable  Post vital signs: Reviewed and stable  Last Vitals:  Vitals Value Taken Time  BP 113/65 11/13/23 1100  Temp 36.3 C 11/13/23 1100  Pulse 84 11/13/23 1100  Resp 11 11/13/23 1100  SpO2 97 % 11/13/23 1100  Vitals shown include unfiled device data.  Last Pain:  Vitals:   11/13/23 1100  TempSrc: Temporal  PainSc: 0-No pain      Patients Stated Pain Goal: 0 (11/07/23 2323)  Complications: There were no known notable events for this encounter.

## 2023-11-13 NOTE — Plan of Care (Signed)
   Problem: Coping: Goal: Level of anxiety will decrease Outcome: Progressing   Problem: Safety: Goal: Ability to remain free from injury will improve Outcome: Progressing   Problem: Pain Managment: Goal: General experience of comfort will improve and/or be controlled Outcome: Progressing

## 2023-11-13 NOTE — Assessment & Plan Note (Signed)
>>  ASSESSMENT AND PLAN FOR PORTAL VENOUS THROMBUS WRITTEN ON 11/13/2023  8:31 AM BY Farris Hong, MD  Remains stable. - EGD prior to discharge, tentatively scheduled for today

## 2023-11-13 NOTE — Assessment & Plan Note (Addendum)
 Hematology following. Concern for amyloidosis vs other plasmacytic disorder. - Hematology following, appreciate recs - bone marrow biopsy planned for today - Hemachromatosis labs pending

## 2023-11-13 NOTE — Assessment & Plan Note (Addendum)
 No current sxs. Patient finished course of keflex  ( 4/16 - 4/22)

## 2023-11-13 NOTE — Op Note (Signed)
 [facility] Patient Name: Brianna Wheeler Procedure Date : 11/13/2023 MRN: [id number] Attending MD: Sergio Dandy , MD, [id number] Date of Birth: [birth year] CSN: [id number] Age: 68 Admit Type: Inpatient Procedure:                Upper GI endoscopy Indications:              Exclusion of esophageal varices in patient with                            suspected portal hypertension Providers:                Sergio Dandy, MD, Millicent Ally, RN, Alfredia Ina, Technician Referring MD:              Medicines:                Monitored Anesthesia Care Complications:            No immediate complications. Estimated Blood Loss:     Estimated blood loss was minimal. Procedure:                Pre-Anesthesia Assessment:                           - Prior to the procedure, a History and Physical                            was performed, and patient medications and                            allergies were reviewed. The patient's tolerance of                            previous anesthesia was also reviewed. The risks                            and benefits of the procedure and the sedation                            options and risks were discussed with the patient.                            All questions were answered, and informed consent                            was obtained. Prior Anticoagulants: The patient has                            taken no anticoagulant or antiplatelet agents. ASA                            Grade Assessment: III - A patient with severe  systemic disease. After reviewing the risks and                            benefits, the patient was deemed in satisfactory                            condition to undergo the procedure.                           After obtaining informed consent, the endoscope was                            passed under direct vision. Throughout the                             procedure, the patient's blood pressure, pulse, and                            oxygen saturations were monitored continuously. The                            [id number]-H190 ([id number]) Olympus endoscope was introduced                            through the mouth, and advanced to the second part                            of duodenum. The upper GI endoscopy was                            accomplished without difficulty. The patient                            tolerated the procedure well. Scope In: Scope Out: Findings:      No gross lesions were noted in the entire esophagus. No esophageal       varices      The Z-line was regular and was found 40 cm from the incisors.      A small hiatal hernia was present.      Patchy mild inflammation characterized by congestion (edema), erythema       and friability was found in the entire examined stomach. Biopsies were       taken with a cold forceps for Helicobacter pylori testing.      The cardia and gastric fundus were normal on retroflexion. No gastric       varices      The examined duodenum was normal. Impression:               - No gross lesions in the entire esophagus.                           - Z-line regular, 40 cm from the incisors.                           - Small hiatal hernia.                           -  Gastritis. Biopsied.                           - Normal examined duodenum. Recommendation:           - Patient has a contact number available for                            emergencies. The signs and symptoms of potential                            delayed complications were discussed with the                            patient. Return to normal activities tomorrow.                            Written discharge instructions were provided to the                            patient.                           - Resume previous diet.                           - Continue present medications.                           - Await pathology results.  Treat if positive for                            H.pylori                           - No high dose aspirin, ibuprofen , naproxen, or                            other non-steroidal anti-inflammatory drugs.                           - GI signing off, available for any questions Procedure Code(s):        --- Professional ---                           [id number], Esophagogastroduodenoscopy, flexible,                            transoral; with biopsy, single or multiple Diagnosis Code(s):        --- Professional ---                           K44.9, Diaphragmatic hernia without obstruction or                            gangrene  K29.70, Gastritis, unspecified, without bleeding CPT copyright 2022 American Medical Association. All rights reserved. The codes documented in this report are preliminary and upon coder review may  be revised to meet current compliance requirements. Koby Hartfield V. Arnecia Ector, MD 11/13/2023 11:40:24 AM This report has been signed electronically. Number of Addenda: 0

## 2023-11-13 NOTE — Assessment & Plan Note (Addendum)
 Resolved. At Baseline. - GI following, appreciate recs - continue 20 mg lactulose  BID - fall precautions - AM CBC, CMP

## 2023-11-13 NOTE — Assessment & Plan Note (Addendum)
 DMII: stable, continue cbg checks BID HTN: holding home meds ISO normotensive BP HLD: holding statin Hypothyroidism: continue synthroid  Pain: continue home tramadol  Vit D Deficiency: supplementing with daily vit D, will need outpatient vit D levels outpatient

## 2023-11-13 NOTE — Plan of Care (Signed)

## 2023-11-13 NOTE — Assessment & Plan Note (Addendum)
 Remains stable. - EGD prior to discharge, tentatively scheduled for today

## 2023-11-14 ENCOUNTER — Encounter (HOSPITAL_COMMUNITY): Payer: Self-pay | Admitting: Gastroenterology

## 2023-11-14 DIAGNOSIS — K7460 Unspecified cirrhosis of liver: Secondary | ICD-10-CM | POA: Diagnosis not present

## 2023-11-14 DIAGNOSIS — R339 Retention of urine, unspecified: Secondary | ICD-10-CM

## 2023-11-14 LAB — CBC
HCT: 28.8 % — ABNORMAL LOW (ref 36.0–46.0)
Hemoglobin: 9.5 g/dL — ABNORMAL LOW (ref 12.0–15.0)
MCH: 35.6 pg — ABNORMAL HIGH (ref 26.0–34.0)
MCHC: 33 g/dL (ref 30.0–36.0)
MCV: 107.9 fL — ABNORMAL HIGH (ref 80.0–100.0)
Platelets: 52 10*3/uL — ABNORMAL LOW (ref 150–400)
RBC: 2.67 MIL/uL — ABNORMAL LOW (ref 3.87–5.11)
RDW: 18.8 % — ABNORMAL HIGH (ref 11.5–15.5)
WBC: 2.7 10*3/uL — ABNORMAL LOW (ref 4.0–10.5)
nRBC: 0 % (ref 0.0–0.2)

## 2023-11-14 LAB — HEMOCHROMATOSIS DNA-PCR(C282Y,H63D)

## 2023-11-14 LAB — GLUCOSE, CAPILLARY
Glucose-Capillary: 88 mg/dL (ref 70–99)
Glucose-Capillary: 114 mg/dL — ABNORMAL HIGH (ref 70–99)

## 2023-11-14 MED ORDER — ALTEPLASE 2 MG IJ SOLR
2.0000 mg | Freq: Once | INTRAMUSCULAR | Status: AC
  Administered 2023-11-14: 2 mg
  Filled 2023-11-14: qty 2

## 2023-11-14 NOTE — Progress Notes (Signed)
 Physical Therapy Treatment Patient Details Name: Brianna Wheeler MRN: [id number] DOB: 05-20-1956 Today's Date: 11/14/2023   History of Present Illness Pt is a 68 y/o female presenting after a fall at home and AMS. Pt admitted for acute encephalopathy, sepsis and BLE/abdominal edema. LP 4/17. PMH: HTN, hypothyroidism, prediabetes, hyperlipidemia, osteoarthritis, GERD, asthma    PT Comments  PT placed hospital bed at the height of her bed at home and took away the bed rails. With increased effort pt was finally able to get up to the side of the bed without assist. Pt unable to come to stand with multiple attempts and total Ax2. PT then elevated the bed and pt requires maxAx2 to come to standing. Pt can only tolerate ~30 sec before sitting back down. Pt able to come to standing again with maxAx2. Asked pt to take side steps towards the [name]. With maxAx2 for steadying pt unable to lift R LE from the floor and attempts to slide her foot towards the [name]. Pt takes 3 bouts of standing and maxA facilitated weight shift to move 3 feet towards the [name]. Bed then lowered to allow pt improved ability to return he LE to the bed, Pt unable. PT asked what pt would do. Pt called her son to pick her legs up one at a time to bring them into the bed.  Pt has poor insight into level of deconditioning and safety for her and her son. D/c plan remains appropriate. PT will continue to follow acutely.   If plan is discharge home, recommend the following: Two people to help with walking and/or transfers;Two people to help with bathing/dressing/bathroom;Assistance with cooking/housework;Assistance with feeding;Direct supervision/assist for medications management;Direct supervision/assist for financial management;Assist for transportation;Help with stairs or ramp for entrance;Supervision due to cognitive status   Can travel by private vehicle     No  Equipment Recommendations  Hospital bed       Precautions / Restrictions  Precautions Precautions: Fall;Other (comment) Recall of Precautions/Restrictions: Impaired Precaution/Restrictions Comments: L eye visual deficits Restrictions Weight Bearing Restrictions Per Provider Order: No     Mobility  Bed Mobility Overal bed mobility: Needs Assistance Bed Mobility: Supine to Sit     Supine to sit: Supervision, [name] elevated     General bed mobility comments: pt reports bed at home is standard height but that [name] does elevate, with increased time and effort pt eventually able to come to the EoB.    Transfers Overall transfer level: Needs assistance Equipment used: Rolling walker (2 wheels), 2 person hand held assist Transfers: Sit to/from Stand Sit to Stand: Total assist, +2 physical assistance, Max assist, From elevated surface           General transfer comment: provide 3+ attempts from low bed surface with and without RW and total Ax2 and pt unable to clear hips from bed, with bed significantly elevated pt requires maxAx2 to come to standing, pt is unable to maintain standing >30 sec, Pt requires 3 more bouts of standing to get to [name] due to pain and weakness in LE. pt is unable to clear R foot from floor even with facilitated weightshifts, pt slides her foot towards [name]      Balance Overall balance assessment: Needs assistance Sitting-balance support: No upper extremity supported Sitting balance-[name]: Fair     Standing balance support: Bilateral upper extremity supported, Reliant on assistive device for balance Standing balance-[name]: Zero Standing balance comment: requires outside assist when fatigued  Communication Communication Communication: No apparent difficulties  Cognition Arousal: Alert Behavior During Therapy: WFL for tasks assessed/performed                             Following commands: Impaired Following commands impaired: Only follows one step commands  consistently, Follows one step commands with increased time    Cueing Cueing Techniques: Verbal cues, Tactile cues, Visual cues     General Comments General comments (skin integrity, edema, etc.): VSS on RA, son in room      Pertinent Vitals/Pain Pain Assessment Pain Assessment: Faces Faces Pain Scale: Hurts little more Pain Location: R hip biopsy site Pain Descriptors / Indicators: Sore, Discomfort Pain Intervention(s): Limited activity within patient's tolerance, Monitored during session, Repositioned     PT Goals (current goals can now be found in the care plan section) Acute Rehab PT Goals PT Goal Formulation: With patient/family Time For Goal Achievement: 11/21/23 Potential to Achieve Goals: Fair Progress towards PT goals: Not progressing toward goals - comment    Frequency    Min 2X/week       AM-PAC PT "6 Clicks" Mobility   Outcome Measure  Help needed turning from your back to your side while in a flat bed without using bedrails?: A Little Help needed moving from lying on your back to sitting on the side of a flat bed without using bedrails?: A Lot Help needed moving to and from a bed to a chair (including a wheelchair)?: Total Help needed standing up from a chair using your arms (e.g., wheelchair or bedside chair)?: Total Help needed to walk in hospital room?: Total Help needed climbing 3-5 steps with a railing? : Total 6 Click Score: 9    End of Session Equipment Utilized During Treatment: Gait belt Activity Tolerance: Patient limited by fatigue Patient left: with family/visitor present;in bed;with bed alarm set Nurse Communication: Mobility status;Need for lift equipment PT Visit Diagnosis: Unsteadiness on feet (R26.81);Other abnormalities of gait and mobility (R26.89);Repeated falls (R29.6);Muscle weakness (generalized) (M62.81);History of falling (Z91.81);Difficulty in walking, not elsewhere classified (R26.2) Pain - Right/Left: Right Pain - part of  body: Leg     Time: 1345-1423 PT Time Calculation (min) (ACUTE ONLY): 38 min  Charges:    $Therapeutic Activity: 38-52 mins PT General Charges $$ ACUTE PT VISIT: 1 Visit                     Brianna Wheeler B. Brianna Wheeler PT, DPT Acute Rehabilitation Services Please use secure chat or  Call Office [phone]    [name] [facility] 11/14/2023, 5:08 PM

## 2023-11-14 NOTE — TOC Progression Note (Addendum)
 Transition of Care [facility]) - Progression Note    Patient Details  Name: Brianna Wheeler MRN: [id number] Date of Birth: 29-Jul-1955  Transition of Care [facility]) CM/SW Contact  Carmon Christen, LCSWA Phone Number: 11/14/2023, 4:17 PM  Clinical Narrative:     CSW tried to call Shonte with [place] [facility] [phone] to discuss next steps for patient. Shonte VM full . CSW unable to leave voicemail. CSW sent text message and asked for call back. CSW will continue to follow.  Update- CSW spoke with [facility] with APS who informed CSW that they are still investigating looking for extended family to help with patients son. Patient declined SNF. Patient wants to return home at dc.MD informed team plan for patient to keep working with PT to get patient stronger for a safer disposition.  PT informed team not eligible for [facility] program due to having unsafe discharge plan.Patient gave CSW permission to reach out to financial counseling to try and screen her for medicaid. CSW Lexicographer with financial counseling and requested to screen patient for medicaid. CSW offered patient transportation resources. Patient accepted.TOC continues to follow.  Expected Discharge Plan: Skilled Nursing Facility Barriers to Discharge: Continued Medical Work up  Expected Discharge Plan and Services In-house Referral: Clinical Social Work Discharge Planning Services: CM Consult   Living arrangements for the past 2 months: Single Family Home                   DME Agency: NA                   Social Determinants of Health (SDOH) Interventions SDOH Screenings   Food Insecurity: No Food Insecurity (11/07/2023)  Housing: Low Risk  (11/07/2023)  Transportation Needs: No Transportation Needs (11/07/2023)  Utilities: Not At Risk (11/07/2023)  Alcohol Screen: Low Risk  (09/30/2023)  Depression (PHQ2-9): Low Risk  (09/30/2023)  Financial Resource Strain: Low Risk  (09/30/2023)  Physical Activity: Insufficiently Active  (09/30/2023)  Social Connections: Moderately Integrated (11/07/2023)  Stress: No Stress Concern Present (09/30/2023)  Tobacco Use: Low Risk  (11/13/2023)  Health Literacy: Adequate Health Literacy (09/30/2023)    Readmission Risk Interventions     No data to display

## 2023-11-14 NOTE — Assessment & Plan Note (Addendum)
 Hematology following.  Concern for amyloidosis versus other plasmacytic disorder, work up pending.  Patient's status post bone marrow biopsy in 4/23.   - Hematology following, appreciate recs  - work up pending, including hemachromatosis labs

## 2023-11-14 NOTE — Assessment & Plan Note (Signed)
 Stable.  Per GI and hematology recs, did not anticoagulate.  Patient to follow-up with GI outpatient

## 2023-11-14 NOTE — Assessment & Plan Note (Signed)
 Has now completed full dose of antibiotics (4/16 - 4/22)

## 2023-11-14 NOTE — Assessment & Plan Note (Signed)
 Foley placed 4/19, which was removed on 4/23. Patient with difficulty urinating after removal. Bladder scans overnight showed 340 mL, patient had 1x I/O cath with 400 mL removed. Some blood stains with I/O, likely ISO of traumatic cath. Will continue to monitor I/Os.  - bladder scans q6h - Strict I/Os

## 2023-11-14 NOTE — Assessment & Plan Note (Signed)
 DMII: Stable, continue CBG checks twice daily HTN: Continue holding home meds in setting of mostly normotensive BPs, will consider addition if blood pressures increase LD: Holding statin, will consider restarting at discharge For thyroidism: Continue Synthroid  Pain: Continue home tramadol  Vitamin D  deficiency: Supplementing with daily vitamin D , will need outpatient vitamin D  level checked

## 2023-11-14 NOTE — Progress Notes (Signed)
 Occupational Therapy Treatment Patient Details Name: Brianna Wheeler MRN: [id number] DOB: [birth year] Today's Date: 11/14/2023   History of present illness Pt is a 68 y/o female presenting after a fall at home and AMS. Pt admitted for acute encephalopathy, sepsis and BLE/abdominal edema. LP 4/17. PMH: HTN, hypothyroidism, prediabetes, hyperlipidemia, osteoarthritis, GERD, asthma   OT comments  OT session focused on training in B UE therapeutic exercises (see details below) to increased strength and activity tolerance for carryover to functional tasks. Pt participated well from bed level but declined addressing ADLs, bed mobility, sitting EOB, or OOB activity this session. Pt is making limited progress toward OT goals. Pt will benefit from continued acute skilled OT services to address deficits outlined below and increase safety and independence with functional tasks. Post acute discharge, pt will benefit from intensive inpatient skilled rehab services < 3 hours per day to maximize rehab potential.       If plan is discharge home, recommend the following:  Two people to help with walking and/or transfers;Two people to help with bathing/dressing/bathroom;Assistance with cooking/housework;Direct supervision/assist for medications management;Direct supervision/assist for financial management;Assist for transportation;Help with stairs or ramp for entrance   Equipment Recommendations  Hoyer lift;Hospital bed    Recommendations for Other Services      Precautions / Restrictions Precautions Precautions: Fall;Other (comment) Recall of Precautions/Restrictions: Impaired Precaution/Restrictions Comments: L eye visual deficits Restrictions Weight Bearing Restrictions Per Provider Order: No       Mobility Bed Mobility               General bed mobility comments: Pt declined repositioning in the bed/sitting EOB due to being comfortable.    Transfers                          Balance                                           ADL either performed or assessed with clinical judgement   ADL Overall ADL's : Needs assistance/impaired                                       General ADL Comments: Pt declined addressing ADLs this session    Extremity/Trunk Assessment Upper Extremity Assessment Upper Extremity Assessment: Right hand dominant;Generalized weakness   Lower Extremity Assessment Lower Extremity Assessment: Defer to PT evaluation        Vision   Vision Assessment?: Vision impaired- to be further tested in functional context Additional Comments: L eye impaired from prior retinal detachment   Perception     Praxis     Communication Communication Communication: No apparent difficulties   Cognition Arousal: Alert Behavior During Therapy: WFL for tasks assessed/performed Cognition: Cognition impaired     Awareness: Intellectual awareness impaired, Online awareness impaired Memory impairment (select all impairments): Short-term memory, Declarative long-term memory, Working memory Attention impairment (select first level of impairment): Selective attention Executive functioning impairment (select all impairments): Organization, Reasoning, Problem solving OT - Cognition Comments: Pt AAOx4 and plesant throughout session. Pt motivated to participate in rehab services to improve strength and independence. However, pt has poor insight into her deficits and unrealistic expectations.                 Following  commands: Impaired Following commands impaired: Only follows one step commands consistently, Follows one step commands with increased time      Cueing   Cueing Techniques: Verbal cues, Tactile cues, Visual cues  Exercises Exercises: General Upper Extremity General Exercises - Upper Extremity Shoulder Flexion: AROM, Strengthening, Both, 20 reps, Supine, Theraband (with [name] elevated) Theraband Level  (Shoulder Flexion): Level 1 (Yellow) Shoulder Extension: AROM, Strengthening, Both, 20 reps, Supine, Theraband (with [name] elevated) Theraband Level (Shoulder Extension): Level 1 (Yellow) Shoulder Horizontal ABduction: AROM, Strengthening, Both, 20 reps, Supine, Theraband (with [name] elevated) Theraband Level (Shoulder Horizontal Abduction): Level 1 (Yellow) Shoulder Horizontal ADduction: AROM, Strengthening, Both, 20 reps, Supine, Theraband (with [name] elevated) Theraband Level (Shoulder Horizontal Adduction): Level 1 (Yellow) Elbow Flexion: AROM, Strengthening, Both, 20 reps, Supine, Theraband (with [name] elevated) Theraband Level (Elbow Flexion): Level 1 (Yellow) Elbow Extension: AROM, Strengthening, Both, 20 reps, Supine, Theraband (with [name] elevated) Theraband Level (Elbow Extension): Level 1 (Yellow)    Shoulder Instructions       General Comments VSS on RA. Son present throughout session.    Pertinent Vitals/ Pain       Pain Assessment Pain Assessment: No/denies pain Pain Intervention(s): Monitored during session  Home Living                                          Prior Functioning/Environment              Frequency  Min 2X/week        Progress Toward Goals  OT Goals(current goals can now be found in the care plan section)  Progress towards OT goals: Progressing toward goals (limited progress toward goals)  Acute Rehab OT Goals Patient Stated Goal: to return home with [facility] services  Plan      Co-evaluation                 AM-PAC OT "6 Clicks" Daily Activity     Outcome Measure   Help from another person eating meals?: A Little Help from another person taking care of personal grooming?: A Little Help from another person toileting, which includes using toliet, bedpan, or urinal?: Total Help from another person bathing (including washing, rinsing, drying)?: A Lot Help from another person to put on and taking off regular upper body  clothing?: A Little Help from another person to put on and taking off regular lower body clothing?: Total 6 Click Score: 13    End of Session    OT Visit Diagnosis: Muscle weakness (generalized) (M62.81);Other (comment) (decreased activity tolerance)   Activity Tolerance Patient tolerated treatment well;Other (comment) (Pt with some self-limiting behaviors)   Patient Left in bed;with call bell/phone within reach;with bed alarm set;with family/visitor present   Nurse Communication Mobility status;Other (comment) (Pt participated well from bed level)        Time: 1610-9604 OT Time Calculation (min): 27 min  Charges: OT General Charges $OT Visit: 1 Visit OT Treatments $Therapeutic Exercise: 23-37 mins  Marlynn Hinckley "Darral Ellis., OTR/L, MA Acute Rehab [phone]   Walt Gunner 11/14/2023, 4:54 PM

## 2023-11-14 NOTE — Assessment & Plan Note (Signed)
>>  ASSESSMENT AND PLAN FOR PORTAL VENOUS THROMBUS WRITTEN ON 11/14/2023  8:34 AM BY Farris Hong, MD  Stable.  Per GI and hematology recs, did not anticoagulate.  Patient to follow-up with GI outpatient

## 2023-11-14 NOTE — Progress Notes (Signed)
 Patient has no urine output since urinary catheter was removed. Bladder scan showed 340 mL. Notified Dr Jari Merles and ordered to do in and out cath and to monitor bladder scans every 6h.  Peri care done. Intermittent catheterization done with another RN, Athena Bland, RN) under sterile technique. Drained orange-colored urine with blood stains. Dr. Jari Merles made aware.  Clemon Devaul, RN

## 2023-11-14 NOTE — Progress Notes (Signed)
 Daily Progress Note Intern Pager: [phone]  Patient name: Brianna Wheeler Medical record number: [id number] Date of birth: April 17, 1956 Age: 68 y.o. Gender: female  Primary Care Provider: Kandis Ormond, DO Consultants: Heme/Onc, GI Code Status: Full   Pt Overview and Major Events to Date:  4/16: admitted 4/23: EGD and BMA   Assessment and Plan: 68 yo F with PMHx of diabetes, HTN, hypothyroidism admitted for AMS likely ISO of hepatic encephalopathy vs urosepsis, now stable with new diagnosis of cirrhosis.   Assessment & Plan Cirrhosis of liver without ascites (HCC) New diagnosis. Unclear etiology, likely NASH. EGD did not show concern for varices  - GI following, appreciate recs - continue 20 mg lactulose  BID - fall precautions Urinary retention Foley placed 4/19, which was removed on 4/23. Patient with difficulty urinating after removal. Bladder scans overnight showed 340 mL, patient had 1x I/O cath with 400 mL removed. Some blood stains with I/O, likely ISO of traumatic cath. Will continue to monitor I/Os.  - bladder scans q6h - Strict I/Os  Gammopathy, monoclonal Hematology following.  Concern for amyloidosis versus other plasmacytic disorder, work up pending.  Patient's status post bone marrow biopsy in 4/23.   - Hematology following, appreciate recs  - work up pending, including hemachromatosis labs  Portal venous thrombus Stable.  Per GI and hematology recs, did not anticoagulate.  Patient to follow-up with GI outpatient E. coli UTI (urinary tract infection) Has now completed full dose of antibiotics (4/16 - 4/22)   Chronic health problem DMII: Stable, continue CBG checks twice daily HTN: Continue holding home meds in setting of mostly normotensive BPs, will consider addition if blood pressures increase LD: Holding statin, will consider restarting at discharge For thyroidism: Continue Synthroid  Pain: Continue home tramadol  Vitamin D  deficiency: Supplementing with  daily vitamin D , will need outpatient vitamin D  level checked  FEN/GI: regular diet  PPx: SCDs in setting of increased bleeding risk Dispo: Unclear at this time.  Patient is approaching medical stability however PT/OT recommends SNF and patient is unwilling to attend SNF given she needs to care for her mentally delayed son. Had long discussion with patient regarding strong recommendation for SNF, however, patient reports she is going to go home. Discussed that she is currently maximum assistance and she understands this but feels she and her son can manage at home. Discussed risks and benefits of this and patient was able to reiterate that she will be risking her safety if she discharges to home. Patient has capacity to make decisions.   Subjective:  NAD. Has not peed without foley yet.   Objective: Temp:  [97.4 F (36.3 C)-98.3 F (36.8 C)] 98.1 F (36.7 C) (04/24 0750) Pulse Rate:  [66-85] 70 (04/24 0750) Resp:  [11-20] 20 (04/24 0750) BP: (99-156)/(46-73) 99/72 (04/24 0750) SpO2:  [95 %-100 %] 97 % (04/24 0750) Physical Exam: General: elderly, NAD Cardiovascular: RRR, systolic murmur Respiratory: distant breath sounds, NWOB on RA Abdomen: soft, NTND  Extremities: edematous bilateral extremities, improved from prior   Laboratory: Most recent CBC Lab Results  Component Value Date   WBC 2.7 (L) 11/14/2023   HGB 9.5 (L) 11/14/2023   HCT 28.8 (L) 11/14/2023   MCV 107.9 (H) 11/14/2023   PLT 52 (L) 11/14/2023   Most recent BMP    Latest Ref Rng & Units 11/13/2023    4:15 AM  BMP  Glucose 70 - 99 mg/dL 80   BUN 8 - 23 mg/dL 13   Creatinine  0.44 - 1.00 mg/dL 8.65   Sodium 784 - 696 mmol/L 137   Potassium 3.5 - 5.1 mmol/L 4.2   Chloride 98 - 111 mmol/L 107   CO2 22 - 32 mmol/L 26   Calcium  8.9 - 10.3 mg/dL 8.0     Imaging/Diagnostic Tests: EGD 4/23: - No gross lesions entire esophagus - Z- line regular, 40 cm from the incisors. - Small hiatal hernia. - Gastritis.  Biopsied. - Normal examined duodenum.  Derril Flint Olamae Ferrara, MD 11/14/2023, 8:18 AM  PGY-1, [facility] Family Medicine FPTS Intern pager: [phone], text pages welcome Secure chat group [facility] Teaching Service

## 2023-11-14 NOTE — Assessment & Plan Note (Signed)
 New diagnosis. Unclear etiology, likely NASH. EGD did not show concern for varices  - GI following, appreciate recs - continue 20 mg lactulose  BID - fall precautions

## 2023-11-14 NOTE — Plan of Care (Signed)
  Problem: Clinical Measurements: Goal: Respiratory complications will improve Outcome: Progressing Goal: Cardiovascular complication will be avoided Outcome: Progressing   Problem: Nutrition: Goal: Adequate nutrition will be maintained Outcome: Progressing   Problem: Pain Managment: Goal: General experience of comfort will improve and/or be controlled Outcome: Progressing   Problem: Safety: Goal: Ability to remain free from injury will improve Outcome: Progressing

## 2023-11-15 ENCOUNTER — Inpatient Hospital Stay (HOSPITAL_COMMUNITY)

## 2023-11-15 DIAGNOSIS — K7460 Unspecified cirrhosis of liver: Secondary | ICD-10-CM | POA: Diagnosis not present

## 2023-11-15 LAB — CBC
HCT: 29.8 % — ABNORMAL LOW (ref 36.0–46.0)
Hemoglobin: 9.8 g/dL — ABNORMAL LOW (ref 12.0–15.0)
MCH: 35.5 pg — ABNORMAL HIGH (ref 26.0–34.0)
MCHC: 32.9 g/dL (ref 30.0–36.0)
MCV: 108 fL — ABNORMAL HIGH (ref 80.0–100.0)
Platelets: 55 10*3/uL — ABNORMAL LOW (ref 150–400)
RBC: 2.76 MIL/uL — ABNORMAL LOW (ref 3.87–5.11)
RDW: 18.8 % — ABNORMAL HIGH (ref 11.5–15.5)
WBC: 2.7 10*3/uL — ABNORMAL LOW (ref 4.0–10.5)
nRBC: 0 % (ref 0.0–0.2)

## 2023-11-15 LAB — GLUCOSE, CAPILLARY
Glucose-Capillary: 77 mg/dL (ref 70–99)
Glucose-Capillary: 81 mg/dL (ref 70–99)

## 2023-11-15 LAB — SURGICAL PATHOLOGY

## 2023-11-15 NOTE — Assessment & Plan Note (Addendum)
 New diagnosis this admission. Unclear etiology, likely NASH. EGD did not show concern for varices.  - Appreciate GI recs - continue 20 mg lactulose  BID - fall precautions

## 2023-11-15 NOTE — Plan of Care (Signed)
?  Problem: Clinical Measurements: ?Goal: Respiratory complications will improve ?Outcome: Progressing ?Goal: Cardiovascular complication will be avoided ?Outcome: Progressing ?  ?Problem: Nutrition: ?Goal: Adequate nutrition will be maintained ?Outcome: Progressing ?  ?Problem: Safety: ?Goal: Ability to remain free from injury will improve ?Outcome: Progressing ?  ?

## 2023-11-15 NOTE — TOC Progression Note (Signed)
 Transition of Care [facility]) - Progression Note    Patient Details  Name: Brianna Wheeler MRN: [id number] Date of Birth: October 09, 1955  Transition of Care [facility]) CM/SW Contact  Graves-Bigelow, Jari Merles, RN Phone Number: 11/15/2023, 2:43 PM  Clinical Narrative:  Case Manager reached out to [facility] to see if patient meets enrollment requirements. Case Manager left a voicemail with Necole @ [phone] call back.   1459 11-15-23 Case Manager just spoke with Necole at [facility] and she is also concerned about caregiver support post discharge. [facility] will need to see what her income is as well to see if eligible for long term Medicaid. FC is following regarding Medicaid eligibility. Per Necole with [facility], if the patient is eligible it will not be until the first of June for enrollment. [facility] will visit next week on Monday. Case Manager will continue to follow for transition of care needs.     Expected Discharge Plan: Skilled Nursing Facility Barriers to Discharge: Continued Medical Work up  Expected Discharge Plan and Services In-house Referral: Clinical Social Work Discharge Planning Services: CM Consult   Living arrangements for the past 2 months: Single Family Home                   DME Agency: NA  Social Determinants of Health (SDOH) Interventions SDOH Screenings   Food Insecurity: No Food Insecurity (11/07/2023)  Housing: Low Risk  (11/07/2023)  Transportation Needs: No Transportation Needs (11/07/2023)  Utilities: Not At Risk (11/07/2023)  Alcohol Screen: Low Risk  (09/30/2023)  Depression (PHQ2-9): Low Risk  (09/30/2023)  Financial Resource Strain: Low Risk  (09/30/2023)  Physical Activity: Insufficiently Active (09/30/2023)  Social Connections: Moderately Integrated (11/07/2023)  Stress: No Stress Concern Present (09/30/2023)  Tobacco Use: Low Risk  (11/13/2023)  Health Literacy: Adequate Health Literacy (09/30/2023)    Readmission Risk Interventions     No data to display

## 2023-11-15 NOTE — Assessment & Plan Note (Deleted)
 Vit D deficient. Both Vit D, 25 hydroxy and Vit D, 1, 25 Hydroxy - will start patient on daily Vit D and have patient follow up Vit D 1,25 Hydroxy outpatient to follow up to ensure no associated renal disease causing Vit D deficiency

## 2023-11-15 NOTE — Assessment & Plan Note (Deleted)
 Resolved. At Baseline. - GI following, appreciate recs - continue 20 mg lactulose  BID - fall precautions - AM CBC, CMP

## 2023-11-15 NOTE — Plan of Care (Addendum)
 Seen at bedside with Dr Fredrik Jensen.  Patient reports blood in the urine over the last month. Describes urine as pink with blood mixed in. Sometimes wears pads and notes some bleeding when not urinating. No pain with urination. No lower abdominal pain. Saw her PCP and recalls leaving urine sample but was not informed of results/follow up.  Pelvic exam performed with Dr Fredrik Jensen and nursing in room. Exam positive for blood and clots in vaginal vault. Unable to visualize cervix.   Transvaginal US  ordered for further assessment. Discussed plan with patient.

## 2023-11-15 NOTE — Assessment & Plan Note (Addendum)
 Has now completed full dose of antibiotics (4/16 - 4/22). Urinary retention management as above.

## 2023-11-15 NOTE — Assessment & Plan Note (Addendum)
 Foley placed 4/19, which was removed on 4/23. Patient with difficulty urinating after removal, now s/p I/O cath x2.   - bladder scans q6h - Strict I/Os  - Foley as needed

## 2023-11-15 NOTE — Assessment & Plan Note (Addendum)
 Stable.  Per GI and hematology recs, did not anticoagulate.  Patient to follow-up with GI outpatient

## 2023-11-15 NOTE — Assessment & Plan Note (Signed)
 DMII: Stable, continue CBG checks twice daily HTN: Continue holding home meds in setting of mostly normotensive BPs, will consider addition if blood pressures increase LD: Holding statin, will consider restarting at discharge For thyroidism: Continue Synthroid  Pain: Continue home tramadol  Vitamin D  deficiency: Supplementing with daily vitamin D , will need outpatient vitamin D  level checked

## 2023-11-15 NOTE — Assessment & Plan Note (Addendum)
 Hematology on board. Concern for amyloidosis versus other plasmacytic disorder, work up pending.  Patient's status post bone marrow biopsy in 4/23.   - Appreciate hematology recs  - Fu BMB  - Fu hemachromatosis labs

## 2023-11-15 NOTE — Assessment & Plan Note (Signed)
>>  ASSESSMENT AND PLAN FOR PORTAL VENOUS THROMBUS WRITTEN ON 11/15/2023  1:39 PM BY Carey Chapman, MD  Stable.  Per GI and hematology recs, did not anticoagulate.  Patient to follow-up with GI outpatient.

## 2023-11-15 NOTE — Progress Notes (Signed)
 PICC Removal Note: PICC line removed from RUE. PICC catheter tip visualized and intact. Pressure dressing applied. No edema, swelling, or drainage noted at site. Instructions provided on post PICC discharge care, including followup notification instructions. Bedrest until 1530.

## 2023-11-15 NOTE — Progress Notes (Signed)
 Pt.'s R TL PICC assessed for occlusion. Gray port is completely occluded, Cathflo instilled to red and white ports. Dwelled for more than two hours, able to draw back blood from red port , sluggish to flush and w/  no good blood return ,unable to draw back blood  on white port. White port is now completely occluded. RN made aware , to notify MD.

## 2023-11-15 NOTE — TOC Progression Note (Addendum)
 Transition of Care [facility]) - Progression Note    Patient Details  Name: Brianna Wheeler MRN: [id number] Date of Birth: [birth year]  Transition of Care [facility]) CM/SW Contact  Juliane Och, LCSW Phone Number: 11/15/2023, 1:39 PM  Clinical Narrative:     1:39 PM CSW attempted to follow up with patient's APS social worker, Marilee Showers [phone]). There was no response and the voicemail box was full. CSW sent HIPAA compliant text to [facility] requesting a return call.  2:37 PM APS social worker returned CSW call and stated that she had yet found family members to care for son. APS social worker inquired about [facility] and [facility]. CSW relayed inquiry to medical team. Per [facility] admissions, patient is not eligible for admission due to amount of needed assistance upon discharge. [facility] admissions to follow up with [facility] admissions. CSW attempted to follow up with patient's APS social worker, Marilee Showers [phone]). There was no response and the voicemail box was full. CSW sent HIPAA compliant text to [facility] requesting a return call.  Expected Discharge Plan: Skilled Nursing Facility Barriers to Discharge: Continued Medical Work up  Expected Discharge Plan and Services In-house Referral: Clinical Social Work Discharge Planning Services: CM Consult   Living arrangements for the past 2 months: Single Family Home                   DME Agency: NA                   Social Determinants of Health (SDOH) Interventions SDOH Screenings   Food Insecurity: No Food Insecurity (11/07/2023)  Housing: Low Risk  (11/07/2023)  Transportation Needs: No Transportation Needs (11/07/2023)  Utilities: Not At Risk (11/07/2023)  Alcohol Screen: Low Risk  (09/30/2023)  Depression (PHQ2-9): Low Risk  (09/30/2023)  Financial Resource Strain: Low Risk  (09/30/2023)  Physical Activity: Insufficiently Active (09/30/2023)  Social Connections: Moderately Integrated (11/07/2023)  Stress: No Stress Concern Present (09/30/2023)  Tobacco  Use: Low Risk  (11/13/2023)  Health Literacy: Adequate Health Literacy (09/30/2023)    Readmission Risk Interventions     No data to display

## 2023-11-15 NOTE — Progress Notes (Signed)
 Daily Progress Note Intern Pager: [phone]  Patient name: Brianna Wheeler Medical record number: [id number] Date of birth: [birth year] Age: 68 y.o. Gender: female  Primary Care Provider: Kandis Ormond, DO Consultants: Hem/Onc, GI Code Status: Full  Pt Overview and Major Events to Date:  4/16: Admitted 4/23: EGD and BMA  Assessment and Plan:  Brianna Wheeler 68 y.o. with hx of T2DM, HTN, hypothyroidism admitted for AMS likely secondary to hepatic encephalopathy vs urosepsis now stable with new diagnosis of cirrhosis.  Assessment & Plan Cirrhosis of liver without ascites (HCC) New diagnosis this admission. Unclear etiology, likely NASH. EGD did not show concern for varices.  - Appreciate GI recs - continue 20 mg lactulose  BID - fall precautions Urinary retention Foley placed 4/19, which was removed on 4/23. Patient with difficulty urinating after removal, now s/p I/O cath x2.   - bladder scans q6h - Strict I/Os  - Foley as needed  Gammopathy, monoclonal Hematology on board. Concern for amyloidosis versus other plasmacytic disorder, work up pending.  Patient's status post bone marrow biopsy in 4/23.   - Appreciate hematology recs  - Fu BMB  - Fu hemachromatosis labs  Portal venous thrombus Stable.  Per GI and hematology recs, did not anticoagulate.  Patient to follow-up with GI outpatient. E. coli UTI (urinary tract infection) Has now completed full dose of antibiotics (4/16 - 4/22). Urinary retention management as above.  Chronic health problem DMII: Stable, continue CBG checks twice daily HTN: Continue holding home meds in setting of mostly normotensive BPs, will consider addition if blood pressures increase LD: Holding statin, will consider restarting at discharge For thyroidism: Continue Synthroid  Pain: Continue home tramadol  Vitamin D  deficiency: Supplementing with daily vitamin D , will need outpatient vitamin D  level checked   FEN/GI: Regular diet PPx:  SCDs Dispo: PT/OT recommending SNF. Team has discussed risks of not going to SNF though patient declines and demonstrates MDM capacity.   Subjective:  Reports continued difficulty urinating. No difficulty breathing. No abdominal pain.   Objective: Temp:  [97.8 F (36.6 C)-98.8 F (37.1 C)] 97.9 F (36.6 C) (04/25 0700) Pulse Rate:  [62-73] 62 (04/25 0700) Resp:  [16-22] 20 (04/25 0700) BP: (99-161)/(58-134) 117/58 (04/25 0700) SpO2:  [93 %-100 %] 97 % (04/25 0700) Physical Exam: General: No acute distress. Resting comfortably in room. CV: Normal S1/S2. No extra heart sounds. Warm and well-perfused. Pulm: Breathing comfortably on room air. CTAB anteriorly. No increased WOB. Abd: Soft, non-tender, non-distended. Skin:  Warm, dry. Neuro: Alert and oriented x4. PEERL. EOMI. Facial sensation intact bilaterally. Facial expressions symmetric. Tongue midline. Normal speech. 5/5 upper extremity strength. 3/5 lower extremity strength. Able to move all extremities spontaneously.   Laboratory: Most recent CBC Lab Results  Component Value Date   WBC 2.7 (L) 11/14/2023   HGB 9.5 (L) 11/14/2023   HCT 28.8 (L) 11/14/2023   MCV 107.9 (H) 11/14/2023   PLT 52 (L) 11/14/2023   Most recent BMP    Latest Ref Rng & Units 11/13/2023    4:15 AM  BMP  Glucose 70 - 99 mg/dL 80   BUN 8 - 23 mg/dL 13   Creatinine 1.47 - 1.00 mg/dL 8.29   Sodium 562 - 130 mmol/L 137   Potassium 3.5 - 5.1 mmol/L 4.2   Chloride 98 - 111 mmol/L 107   CO2 22 - 32 mmol/L 26   Calcium  8.9 - 10.3 mg/dL 8.0    Brianna Chapman, MD 11/15/2023, 7:31 AM  PGY-1, [facility] Family Medicine FPTS Intern pager: [phone], text pages welcome Secure chat group [facility] Teaching Service

## 2023-11-15 NOTE — Progress Notes (Signed)
   11/15/23 0416  Urine Measurement/Characteristics  Urine Color Orange  Urine Appearance Clear  Intermittent/Straight Cath (mL) 550 mL (Bladder scan showed urine retention. Oncall MD notified. Peri care done. Intermittent catheterization done using FR16 with another RN Audrie Blind, RN). Was able to drain orange-colored urine. Patient tolerated the procedure well. MDs made aware.)  Hygiene Peri care   Rafael Bun, RN

## 2023-11-16 DIAGNOSIS — N939 Abnormal uterine and vaginal bleeding, unspecified: Secondary | ICD-10-CM | POA: Insufficient documentation

## 2023-11-16 DIAGNOSIS — K7460 Unspecified cirrhosis of liver: Secondary | ICD-10-CM | POA: Diagnosis not present

## 2023-11-16 LAB — CBC
HCT: 28.1 % — ABNORMAL LOW (ref 36.0–46.0)
Hemoglobin: 9.2 g/dL — ABNORMAL LOW (ref 12.0–15.0)
MCH: 35.5 pg — ABNORMAL HIGH (ref 26.0–34.0)
MCHC: 32.7 g/dL (ref 30.0–36.0)
MCV: 108.5 fL — ABNORMAL HIGH (ref 80.0–100.0)
Platelets: 46 10*3/uL — ABNORMAL LOW (ref 150–400)
RBC: 2.59 MIL/uL — ABNORMAL LOW (ref 3.87–5.11)
RDW: 19.1 % — ABNORMAL HIGH (ref 11.5–15.5)
WBC: 2.3 10*3/uL — ABNORMAL LOW (ref 4.0–10.5)
nRBC: 0 % (ref 0.0–0.2)

## 2023-11-16 LAB — GLUCOSE, CAPILLARY
Glucose-Capillary: 77 mg/dL (ref 70–99)
Glucose-Capillary: 124 mg/dL — ABNORMAL HIGH (ref 70–99)

## 2023-11-16 MED ORDER — TAMSULOSIN HCL 0.4 MG PO CAPS
0.4000 mg | ORAL_CAPSULE | Freq: Every day | ORAL | Status: DC
  Administered 2023-11-16 – 2023-11-25 (×10): 0.4 mg via ORAL
  Filled 2023-11-16 (×10): qty 1

## 2023-11-16 MED ORDER — ACETAMINOPHEN 325 MG PO TABS: 650.0000 mg | ORAL_TABLET | Freq: Four times a day (QID) | ORAL | Status: DC | PRN

## 2023-11-16 MED ORDER — ACETAMINOPHEN 325 MG PO TABS
650.0000 mg | ORAL_TABLET | Freq: Three times a day (TID) | ORAL | Status: DC | PRN
  Administered 2023-11-24: 650 mg via ORAL
  Filled 2023-11-16: qty 2

## 2023-11-16 NOTE — Assessment & Plan Note (Signed)
 Resolved. Completed full dose of antibiotics (4/16 - 4/22)

## 2023-11-16 NOTE — Assessment & Plan Note (Signed)
 Hematology on board. Now s/p bone marrow biopsy.  - Hematology following, appreicate recs. Will likely follow up outpatient - hemochromatosis genetic work up negative

## 2023-11-16 NOTE — Progress Notes (Signed)
 Daily Progress Note Intern Pager: [phone]  Patient name: Brianna Wheeler Medical record number: [id number] Date of birth: 04-Feb-1956 Age: 68 y.o. Gender: female  Primary Care Provider: Kandis Ormond, DO Consultants: Heme/Onc, GI Code Status: Full Code   Pt Overview and Major Events to Date:  4/16: admitted 4/23: EGD and BMA completed   Assessment and Plan: Patient is a 68 year old female with past medical history of type 2 diabetes, hypertension, hypothyroidism admitted for altered mental status likely secondary to hepatic encephalopathy versus urosepsis.  Patient is now stable and has new diagnosis of cirrhosis.  Assessment & Plan Cirrhosis of liver without ascites (HCC) GI signed off. Will need outpatient follow up. Patient is likely decompensated from this. At baseline, able to ambulate independently or with walker around the house. Able to bathe herself and drive to appointments/grocery store and use wheelchair to get around.  - continue 20 mg lactulose  BID - fall precautions  Urinary retention Foley placed 4/19, which was removed 4/23. Patient has been retaining urine since removal. Patient is now s/p I&O x Foley placed 4/19, which was removed on 4/23. Patient with difficulty urinating after removal, now s/p I/O cath x2. Documented one spontaneous void yesterday. - continue bladder scans q6h - start flomax 0.4 mg daily  - strict I/Os - will order PVR if patient is spontaneously voiding  Gammopathy, monoclonal Hematology on board. Now s/p bone marrow biopsy.  - Hematology following, appreicate recs. Will likely follow up outpatient - hemochromatosis genetic work up negative Portal venous thrombus Found on this admission. Stable. Did not anticoagulate per GI and hematology recs. Patient to follow up with GI outpatient. E. coli UTI (urinary tract infection) Resolved. Completed full dose of antibiotics (4/16 - 4/22) Vaginal bleeding Patient c/o of blood in urine.  Pelvic exam completed yesterday showed blood clots in vaginal vault. TVUS showed  abnormal endometrial thickening 15 mm.  - will need outpatient follow up and endometrial biopsy  Chronic health problem DMII: stable, continue to check cbg twice daily HTN: stable, holding BP meds HLD: holding statin, will restart at discharge Hypothyroidism: continue home synthroid  Pain: continue home tramadol  prn  Vit D deficiency: supplementing with vit D daily, will need outpatient vit D level checked   FEN/GI: Regular diet  PPx: holding pharmaceutical ISO low platlets, continue SCDs  Dispo: Unclear. Working with TOC to ensure safe discharge for patient and son.   Subjective:  NAD, reports she has been spontaneously voiding. Discussed baseline status. Reports she was able to walk to bathroom by herself before, cook and clean. Able to drive herself to appointments and grocery store.   Objective: Temp:  [97.8 F (36.6 C)-98.1 F (36.7 C)] 98 F (36.7 C) (04/26 0730) Pulse Rate:  [63-70] 66 (04/26 0730) Resp:  [18-20] 20 (04/26 0730) BP: (102-126)/(54-85) 102/65 (04/26 0730) SpO2:  [94 %-98 %] 97 % (04/26 0730) Physical Exam: General: obese, NAD Cardiovascular: RRR, systolic murmur Respiratory: distant breath sounds, NWOB on RA Abdomen: obese, NTND, soft Extremities: edema to bilateral lower extremities 3+,  MSK: Strength in left lower leg 5/5, strength in RLE 4/5, bilateral upper extremity strength 5/5   Laboratory: Most recent CBC Lab Results  Component Value Date   WBC 2.3 (L) 11/16/2023   HGB 9.2 (L) 11/16/2023   HCT 28.1 (L) 11/16/2023   MCV 108.5 (H) 11/16/2023   PLT 46 (L) 11/16/2023   Most recent BMP    Latest Ref Rng & Units 11/13/2023  4:15 AM  BMP  Glucose 70 - 99 mg/dL 80   BUN 8 - 23 mg/dL 13   Creatinine 8.11 - 1.00 mg/dL 9.14   Sodium 782 - 956 mmol/L 137   Potassium 3.5 - 5.1 mmol/L 4.2   Chloride 98 - 111 mmol/L 107   CO2 22 - 32 mmol/L 26   Calcium  8.9 - 10.3  mg/dL 8.0    Imaging/Diagnostic Tests:  TVUS 4/25: 1. Abnormal endometrial thickening measuring 15 mm. In the setting of post-menopausal bleeding, endometrial sampling is indicated to exclude carcinoma. If results are benign, sonohysterogram should be considered for focal lesion work-up. (Ref: Radiological Reasoning: Algorithmic Workup of Abnormal Vaginal Bleeding with Endovaginal Sonography and Sonohysterography. AJR 2008; 213:Y86-57) 2. Nonvisualization of the ovaries, likely atrophic.  Derril Flint Dameisha Tschida, MD 11/16/2023, 8:16 AM  PGY-1, [facility] Family Medicine FPTS Intern pager: [phone], text pages welcome Secure chat group [facility] Teaching Service

## 2023-11-16 NOTE — Assessment & Plan Note (Signed)
 DMII: stable, continue to check cbg twice daily HTN: stable, holding BP meds, intermittently softer pressures during admission HLD: holding statin, will restart at discharge Hypothyroidism: continue home synthroid  Pain: continue home tramadol  prn  Vit D deficiency: supplementing with vit D daily, will need outpatient vit D level checked

## 2023-11-16 NOTE — Plan of Care (Signed)
  Problem: Clinical Measurements: Goal: Respiratory complications will improve Outcome: Progressing Goal: Cardiovascular complication will be avoided Outcome: Progressing   Problem: Nutrition: Goal: Adequate nutrition will be maintained Outcome: Progressing   Problem: Pain Managment: Goal: General experience of comfort will improve and/or be controlled Outcome: Progressing   Problem: Safety: Goal: Ability to remain free from injury will improve Outcome: Progressing

## 2023-11-16 NOTE — Progress Notes (Signed)
 Daily Progress Note Intern Pager: [phone]  Patient name: Brianna Wheeler Medical record number: [id number] Date of birth: 1955-11-12 Age: 68 y.o. Gender: female  Primary Care Provider: Kandis Ormond, DO Consultants: Heme/Onc, GI Code Status: FULL  Pt Overview and Major Events to Date:  4/16: admitted 4/23: EGD and BMA completed    Assessment and Plan: Patient is a 68 year old female with PMH of type 2 diabetes, hypertension, hypothyroidism admitted for altered mental status likely secondary to hepatic encephalopathy versus urosepsis.  Patient is no longer altered and has new diagnosis of cirrhosis.   Now working on safe disposition for patient.  Assessment & Plan Cirrhosis of liver without ascites (HCC) GI signed off. Will need outpatient follow up. At baseline, able to ambulate independently or with walker around the house. Able to bathe self and drive to appointments/grocery store and use wheelchair to get around.  - continue 20 mg lactulose  BID - titrate to 2 BM daily  - fall precautions  Urinary retention Foley placed 4/19, which was removed 4/23. Spontaneously voiding, have discontinued bladder scans  - flomax 0.4 mg daily  - strict I/Os Gammopathy, monoclonal Hematology on board. Now s/p bone marrow biopsy.  - Hematology following, appreciate recs. Will likely follow up outpatient - hemochromatosis genetic work up negative Portal venous thrombus Found on this admission. Stable. Did not anticoagulate per GI and hematology recs. Patient to follow up with GI outpatient. E. coli UTI (urinary tract infection) Resolved. Completed full dose of antibiotics (4/16 - 4/22) Vaginal bleeding Patient c/o of blood in urine. Pelvic exam completed yesterday showed blood clots in vaginal vault. TVUS showed abnormal endometrial thickening 15 mm.  - will need outpatient follow up and endometrial biopsy  Hepatic encephalopathy (HCC) Resolved. At Baseline. - GI following,  appreciate recs - continue 20 mg lactulose  BID - fall precautions - AM CBC, CMP   Chronic health problem DMII: stable, continue to check cbg twice daily HTN: stable, holding BP meds, intermittently softer pressures during admission HLD: holding statin, will restart at discharge Hypothyroidism: continue home synthroid  Pain: continue home tramadol  prn  Vit D deficiency: supplementing with vit D daily, will need outpatient vit D level checked      FEN/GI: Regular diet  PPx: holding ppx d/t low platelets, continue SCDs  Dispo: Unclear. Working with TOC to ensure safe discharge for patient and son.   Subjective:  NAEON  Objective: Temp:  [98 F (36.7 C)] 98 F (36.7 C) (04/26 0730) Pulse Rate:  [63-87] 69 (04/26 2131) Resp:  [20] 20 (04/26 0730) BP: (102-117)/(56-65) 112/61 (04/26 2131) SpO2:  [92 %-98 %] 96 % (04/26 2131) General: Alert and oriented in no apparent distress Heart: Regular rate and rhythm with no murmurs appreciated Lungs: Normal wob Abdomen: no abdominal pain Skin: Warm and dry, scds on   Laboratory: Most recent CBC Lab Results  Component Value Date   WBC 2.3 (L) 11/16/2023   HGB 9.2 (L) 11/16/2023   HCT 28.1 (L) 11/16/2023   MCV 108.5 (H) 11/16/2023   PLT 46 (L) 11/16/2023   Most recent BMP    Latest Ref Rng & Units 11/13/2023    4:15 AM  BMP  Glucose 70 - 99 mg/dL 80   BUN 8 - 23 mg/dL 13   Creatinine 1.47 - 1.00 mg/dL 8.29   Sodium 562 - 130 mmol/L 137   Potassium 3.5 - 5.1 mmol/L 4.2   Chloride 98 - 111 mmol/L 107   CO2 22 -  32 mmol/L 26   Calcium  8.9 - 10.3 mg/dL 8.0      Brianna Headland, MD 11/16/2023, 10:13 PM  PGY-3, [facility] Health Family Medicine FPTS Intern pager: [phone], text pages welcome Secure chat group [facility] Teaching Service

## 2023-11-16 NOTE — Assessment & Plan Note (Signed)
 Patient c/o of blood in urine. Pelvic exam completed yesterday showed blood clots in vaginal vault. TVUS showed abnormal endometrial thickening 15 mm.  - will need outpatient follow up and endometrial biopsy

## 2023-11-16 NOTE — Assessment & Plan Note (Signed)
>>  ASSESSMENT AND PLAN FOR PORTAL VENOUS THROMBUS WRITTEN ON 11/16/2023  8:36 AM BY Farris Hong, MD  Found on this admission. Stable. Did not anticoagulate per GI and hematology recs. Patient to follow up with GI outpatient.

## 2023-11-16 NOTE — Assessment & Plan Note (Signed)
>>  ASSESSMENT AND PLAN FOR PORTAL VENOUS THROMBUS WRITTEN ON 11/16/2023 10:13 PM BY MAXWELL, ALLEE, MD  Found on this admission. Stable. Did not anticoagulate per GI and hematology recs. Patient to follow up with GI outpatient.

## 2023-11-16 NOTE — Assessment & Plan Note (Signed)
 Patient c/o of blood in urine. Pelvic exam completed yesterday showed blood clots in vaginal vault. TVUS showed  abnormal endometrial thickening 15 mm.  - will need outpatient follow up and endometrial biopsy

## 2023-11-16 NOTE — Assessment & Plan Note (Addendum)
 GI signed off. Will need outpatient follow up. Patient is likely decompensated from this. At baseline, able to ambulate independently or with walker around the house. Able to bathe herself and drive to appointments/grocery store and use wheelchair to get around.  - continue 20 mg lactulose  BID - fall precautions

## 2023-11-16 NOTE — Assessment & Plan Note (Signed)
 Foley placed 4/19, which was removed 4/23. Spontaneously voiding, have discontinued bladder scans  - flomax 0.4 mg daily  - strict I/Os

## 2023-11-16 NOTE — Plan of Care (Signed)
  Problem: Safety: Goal: Non-violent Restraint(s) Outcome: Progressing   Problem: Education: Goal: Knowledge of General Education information will improve Description: Including pain rating scale, medication(s)/side effects and non-pharmacologic comfort measures Outcome: Progressing   Problem: Clinical Measurements: Goal: Ability to maintain clinical measurements within normal limits will improve Outcome: Progressing   Problem: Clinical Measurements: Goal: Will remain free from infection Outcome: Progressing   Problem: Activity: Goal: Risk for activity intolerance will decrease Outcome: Progressing   Problem: Nutrition: Goal: Adequate nutrition will be maintained Outcome: Progressing   Problem: Elimination: Goal: Will not experience complications related to bowel motility Outcome: Progressing

## 2023-11-16 NOTE — Assessment & Plan Note (Signed)
 DMII: stable, continue to check cbg twice daily HTN: stable, holding BP meds HLD: holding statin, will restart at discharge Hypothyroidism: continue home synthroid  Pain: continue home tramadol  prn  Vit D deficiency: supplementing with vit D daily, will need outpatient vit D level checked

## 2023-11-16 NOTE — Assessment & Plan Note (Signed)
 Resolved. At Baseline. - GI following, appreciate recs - continue 20 mg lactulose  BID - fall precautions - AM CBC, CMP

## 2023-11-16 NOTE — Assessment & Plan Note (Signed)
 GI signed off. Will need outpatient follow up. Patient is likely decompensated from this. At baseline, able to ambulate independently or with walker around the house. Able to bathe herself and drive to appointments/grocery store and use wheelchair to get around.  - continue 20 mg lactulose  BID - fall precautions

## 2023-11-16 NOTE — Assessment & Plan Note (Signed)
 Found on this admission. Stable. Did not anticoagulate per GI and hematology recs. Patient to follow up with GI outpatient.

## 2023-11-16 NOTE — Assessment & Plan Note (Addendum)
 Foley placed 4/19, which was removed 4/23. Spontaneously voiding, have discontinued bladder scans  - flomax 0.4 mg daily  - strict I/Os

## 2023-11-16 NOTE — Assessment & Plan Note (Signed)
 Vit D deficient. Both Vit D, 25 hydroxy and Vit D, 1, 25 Hydroxy - will start patient on daily Vit D and have patient follow up Vit D 1,25 Hydroxy outpatient to follow up to ensure no associated renal disease causing Vit D deficiency

## 2023-11-17 DIAGNOSIS — K7460 Unspecified cirrhosis of liver: Secondary | ICD-10-CM | POA: Diagnosis not present

## 2023-11-17 DIAGNOSIS — I82890 Acute embolism and thrombosis of other specified veins: Secondary | ICD-10-CM | POA: Diagnosis not present

## 2023-11-17 LAB — HEPATIC FUNCTION PANEL
ALT: 36 U/L (ref 0–44)
AST: 84 U/L — ABNORMAL HIGH (ref 15–41)
Albumin: <1.5 g/dL — ABNORMAL LOW (ref 3.5–5.0)
Alkaline Phosphatase: 68 U/L (ref 38–126)
Bilirubin, Direct: 0.8 mg/dL — ABNORMAL HIGH (ref 0.0–0.2)
Indirect Bilirubin: 1.6 mg/dL — ABNORMAL HIGH (ref 0.3–0.9)
Total Bilirubin: 2.4 mg/dL — ABNORMAL HIGH (ref 0.0–1.2)
Total Protein: 5.6 g/dL — ABNORMAL LOW (ref 6.5–8.1)

## 2023-11-17 LAB — GLUCOSE, CAPILLARY
Glucose-Capillary: 87 mg/dL (ref 70–99)
Glucose-Capillary: 104 mg/dL — ABNORMAL HIGH (ref 70–99)

## 2023-11-17 NOTE — Progress Notes (Signed)
 Brianna Wheeler is still in the hospital.  She is still having some difficulties.  Now, the problem is that she is having gross hematuria.  She had a ultrasound of the pelvis yesterday.  This showed endometrial thickening.  The ovaries were not visualized, likely atrophic.  She did have a bone marrow biopsy that was done.  This was done on the 23rd.  The pathology report ([id number]) did show a plasma cell disorder.  She had 15-20% plasma cells.  They were lambda restricted.  As such, I believe that she does have myeloma.  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  By the aspirate, she had adequate  megakaryocytes.  She had 24-hour urine that was done.  This did show IgG lambda monoclonal protein in the urine.  Her SPEP showed a monoclonal spike of 2.3 g/dL.  Her IgG level was 3645 mg/dL.  Her lambda light chain was 11.1 mg/dL.  I do not believe that the smoldering myeloma is the source of her cytopenia.  I still believe this is secondary to her cirrhosis.  On her EGD, there is no varices.  Her last show white count 2.3.  Hemoglobin 9.2.  Platelet count 46,000.  Again, I am not sure what the bleeding is all about.  She really must have significant liver dysfunction given the fact that her albumin is less than 1.5.  Her bilirubin is 2.4.  I would not think that she would have amyloidosis although I suppose this could certainly tie everything together.  I would certainly see if pathology could stain the stomach specimens.  I would see if they could stain the bone marrow.  I had a long talk with she and her son this afternoon.  I explained to them what we found with the bone marrow and what this means.  We could certainly treat this as an outpatient.  I would use daratumumab and Velcade.  I think this would be a very reasonable and very effective and very well-tolerated protocol.  Again, she has a lot going on.  It is hard to tie everything together.  However, I think amyloid is always a possibility.   Again, I would see if pathology can do some additional stains on the stomach and on the bone marrow.  I would think that a liver biopsy might be very tricky given her thrombocytopenia.  However, this might need to be done.  I know that the staff up on [address] are doing a great job with her.    Rayleen Cal, MD  Romans 8:28

## 2023-11-17 NOTE — Plan of Care (Signed)
   Problem: Coping: Goal: Level of anxiety will decrease Outcome: Progressing

## 2023-11-17 NOTE — Plan of Care (Signed)
  Problem: Safety: Goal: Non-violent Restraint(s) Outcome: Progressing   Problem: Clinical Measurements: Goal: Will remain free from infection Outcome: Progressing   Problem: Clinical Measurements: Goal: Respiratory complications will improve Outcome: Progressing   Problem: Clinical Measurements: Goal: Cardiovascular complication will be avoided Outcome: Progressing

## 2023-11-18 DIAGNOSIS — K7460 Unspecified cirrhosis of liver: Secondary | ICD-10-CM | POA: Diagnosis not present

## 2023-11-18 LAB — COMPREHENSIVE METABOLIC PANEL WITH GFR
ALT: 33 U/L (ref 0–44)
AST: 77 U/L — ABNORMAL HIGH (ref 15–41)
Albumin: <1.5 g/dL — ABNORMAL LOW (ref 3.5–5.0)
Alkaline Phosphatase: 63 U/L (ref 38–126)
Anion gap: 4 — ABNORMAL LOW (ref 5–15)
BUN: 9 mg/dL (ref 8–23)
CO2: 22 mmol/L (ref 22–32)
Calcium: 7.9 mg/dL — ABNORMAL LOW (ref 8.9–10.3)
Chloride: 106 mmol/L (ref 98–111)
Creatinine, Ser: 0.77 mg/dL (ref 0.44–1.00)
GFR, Estimated: >60 mL/min (ref >60)
Glucose, Bld: 79 mg/dL (ref 70–99)
Potassium: 4.4 mmol/L (ref 3.5–5.1)
Sodium: 132 mmol/L — ABNORMAL LOW (ref 135–145)
Total Bilirubin: 2 mg/dL — ABNORMAL HIGH (ref 0.0–1.2)
Total Protein: 5 g/dL — ABNORMAL LOW (ref 6.5–8.1)

## 2023-11-18 LAB — CBC WITH DIFFERENTIAL/PLATELET
Abs Immature Granulocytes: 0.02 10*3/uL (ref 0.00–0.07)
Basophils Absolute: 0 10*3/uL (ref 0.0–0.1)
Basophils Relative: 2 %
Eosinophils Absolute: 0.1 10*3/uL (ref 0.0–0.5)
Eosinophils Relative: 5 %
HCT: 26 % — ABNORMAL LOW (ref 36.0–46.0)
Hemoglobin: 8.7 g/dL — ABNORMAL LOW (ref 12.0–15.0)
Immature Granulocytes: 1 %
Lymphocytes Relative: 37 %
Lymphs Abs: 0.6 10*3/uL — ABNORMAL LOW (ref 0.7–4.0)
MCH: 36.7 pg — ABNORMAL HIGH (ref 26.0–34.0)
MCHC: 33.5 g/dL (ref 30.0–36.0)
MCV: 109.7 fL — ABNORMAL HIGH (ref 80.0–100.0)
Monocytes Absolute: 0.1 10*3/uL (ref 0.1–1.0)
Monocytes Relative: 7 %
Neutro Abs: 0.8 10*3/uL — ABNORMAL LOW (ref 1.7–7.7)
Neutrophils Relative %: 48 %
Platelets: 37 10*3/uL — ABNORMAL LOW (ref 150–400)
RBC: 2.37 MIL/uL — ABNORMAL LOW (ref 3.87–5.11)
RDW: 19.4 % — ABNORMAL HIGH (ref 11.5–15.5)
WBC: 1.8 10*3/uL — ABNORMAL LOW (ref 4.0–10.5)
nRBC: 0 % (ref 0.0–0.2)

## 2023-11-18 LAB — CBC
HCT: 30.4 % — ABNORMAL LOW (ref 36.0–46.0)
Hemoglobin: 10.3 g/dL — ABNORMAL LOW (ref 12.0–15.0)
MCH: 36.7 pg — ABNORMAL HIGH (ref 26.0–34.0)
MCHC: 33.9 g/dL (ref 30.0–36.0)
MCV: 108.2 fL — ABNORMAL HIGH (ref 80.0–100.0)
Platelets: 57 10*3/uL — ABNORMAL LOW (ref 150–400)
RBC: 2.81 MIL/uL — ABNORMAL LOW (ref 3.87–5.11)
RDW: 19.7 % — ABNORMAL HIGH (ref 11.5–15.5)
WBC: 2.7 10*3/uL — ABNORMAL LOW (ref 4.0–10.5)
nRBC: 0 % (ref 0.0–0.2)

## 2023-11-18 LAB — GLUCOSE, CAPILLARY
Glucose-Capillary: 81 mg/dL (ref 70–99)
Glucose-Capillary: 110 mg/dL — ABNORMAL HIGH (ref 70–99)

## 2023-11-18 LAB — PATHOLOGIST SMEAR REVIEW

## 2023-11-18 MED ORDER — TRAMADOL HCL 50 MG PO TABS
50.0000 mg | ORAL_TABLET | Freq: Two times a day (BID) | ORAL | Status: DC | PRN
  Administered 2023-11-18 – 2023-11-24 (×8): 50 mg via ORAL
  Filled 2023-11-18 (×9): qty 1

## 2023-11-18 MED ORDER — LACTULOSE 10 GM/15ML PO SOLN
20.0000 g | Freq: Three times a day (TID) | ORAL | Status: DC
  Administered 2023-11-18 – 2023-11-25 (×12): 20 g via ORAL
  Filled 2023-11-18 (×16): qty 30

## 2023-11-18 MED ORDER — DICLOFENAC SODIUM 1 % EX GEL
2.0000 g | Freq: Four times a day (QID) | CUTANEOUS | Status: DC | PRN
  Administered 2023-11-19 – 2023-11-22 (×2): 2 g via TOPICAL
  Filled 2023-11-18: qty 100

## 2023-11-18 NOTE — Progress Notes (Signed)
 Daily Progress Note Intern Pager: [phone]  Patient name: Brianna Wheeler Medical record number: [id number] Date of birth: [birth year] Age: 68 y.o. Gender: female  Primary Care Provider: Kandis Ormond, DO Consultants: Heme/Onc, GI Code Status: Full  Pt Overview and Major Events to Date:  4/16: Admitted 4/23: EGD and BMA completed  Assessment and Plan: Patient is a 68 year old female with past medical history of T2DM, HTN, hypothyroidism admitted for altered mental status likely secondary to hepatic encephalopathy versus urosepsis. Patient with new diagnosis of cirrhosis and likely myeloma during admission. Patient is now stable for discharge. Assessment & Plan Cirrhosis of liver without ascites (HCC) Patient to follow-up with GI outpatient.  No changes to plan.   - Continue 20 mg lactulose  twice daily, titrate prn - platelets continue to fall, will discuss threshold for infusion with Dr. Maria Wheeler - fall precautions  Urinary retention Foley in place 4/19- 4/23. Has been voiding spontaneously. - continue flomax 0.4 mg daily - strict I/Os Gammopathy, monoclonal Hematology following, believe she has myeloma - hematology following, appreciate recs - will need outpatient follow up  Chronic health problem DMII: stable, continue CBG twice daily  HTN: normotensive, holding home BP meds Hypothyroidism; continue synthroid  HLD: restart home statin at discharge Pain: continue home tramadol  prn, voltaren  gel prn  Vit D: supplementing with Vit D daily, will need outpatient vit D level checked    FEN/GI:  regular diet  PPx: holding ISO bleeding risk  Dispo: awaiting APS decision regarding son   Subjective:  C/o back pain, did not get tramadol  last few days  Objective: Temp:  [97.9 F (36.6 C)] 97.9 F (36.6 C) (04/27 1945) Pulse Rate:  [67-77] 77 (04/27 1945) Resp:  [16-18] 18 (04/27 1945) BP: (105-115)/(47-57) 115/57 (04/27 1945) SpO2:  [94 %-97 %] 94 % (04/27  1945) Physical Exam: General: chronically ill appearing, NAD Cardiovascular: RRR, systolic murmur Respiratory: distant breath sounds Abdomen: obese, soft  Extremities: 3+ pitting edema to bilateral feet, strength remains 5/5 to LLE and 4/5 to RLE   Laboratory: Most recent CBC Lab Results  Component Value Date   WBC 1.8 (L) 11/18/2023   HGB 8.7 (L) 11/18/2023   HCT 26.0 (L) 11/18/2023   MCV 109.7 (H) 11/18/2023   PLT 37 (L) 11/18/2023   Most recent BMP    Latest Ref Rng & Units 11/18/2023    5:07 AM 11/17/2023    3:20 AM 11/13/2023    4:15 AM  CMP  Glucose 70 - 99 mg/dL 79   80   BUN 8 - 23 mg/dL 9   13   Creatinine 1.47 - 1.00 mg/dL 8.29   5.62   Sodium 130 - 145 mmol/L 132   137   Potassium 3.5 - 5.1 mmol/L 4.4   4.2   Chloride 98 - 111 mmol/L 106   107   CO2 22 - 32 mmol/L 22   26   Calcium  8.9 - 10.3 mg/dL 7.9   8.0   Total Protein 6.5 - 8.1 g/dL 5.0  5.6  5.3   Total Bilirubin 0.0 - 1.2 mg/dL 2.0  2.4  3.1   Alkaline Phos 38 - 126 U/L 63  68  54   AST 15 - 41 U/L 77  84  101   ALT 0 - 44 U/L 33  36  38       Imaging/Diagnostic Tests: No new imaging Brianna Flint Aubryanna Nesheim, MD 11/18/2023, 7:45 AM  PGY-1, [facility] FPTS  Intern pager: [phone], text pages welcome Secure chat group [facility] Teaching Service

## 2023-11-18 NOTE — Assessment & Plan Note (Signed)
 Hematology following, believe she has myeloma - hematology following, appreciate recs - will need outpatient follow up

## 2023-11-18 NOTE — Assessment & Plan Note (Signed)
 Foley in place 4/19- 4/23. Has been voiding spontaneously. - continue flomax 0.4 mg daily - strict I/Os

## 2023-11-18 NOTE — Plan of Care (Signed)
  Problem: Health Behavior/Discharge Planning: Goal: Ability to manage health-related needs will improve Outcome: Progressing   Problem: Activity: Goal: Risk for activity intolerance will decrease Outcome: Progressing   Problem: Safety: Goal: Ability to remain free from injury will improve Outcome: Progressing   Problem: Skin Integrity: Goal: Risk for impaired skin integrity will decrease Outcome: Progressing   

## 2023-11-18 NOTE — Plan of Care (Signed)

## 2023-11-18 NOTE — Progress Notes (Signed)
 Physical Therapy Treatment Patient Details Name: Brianna Wheeler MRN: [id number] DOB: [birth year] Today's Date: 11/18/2023   History of Present Illness Pt is a 68 y/o female presenting after a fall at home and AMS. Pt admitted for acute encephalopathy, sepsis and BLE/abdominal edema. LP 4/17. PMH: HTN, hypothyroidism, prediabetes, hyperlipidemia, osteoarthritis, GERD, asthma    PT Comments  Pt limited in session today by on going stomach cramps. Pt agreeable to try standing to be able to stretch and extend stomach. Pt is also limited by decreased strength, balance and endurance. Pt is able to come to EoB with minAx2. Once EoB pt able to pull up on turned around [place]. Once in standing pt able to perform marching in place, 5x sit<>stand and lateral stepping towards [name] before pt fatigue and request to return to bed. Pt requires modAx2 for lifting LE back into bed. D/c plans remain appropriate at this time. PT will continue to follow acutely.    If plan is discharge home, recommend the following: Two people to help with walking and/or transfers;Two people to help with bathing/dressing/bathroom;Assistance with cooking/housework;Assistance with feeding;Direct supervision/assist for medications management;Direct supervision/assist for financial management;Assist for transportation;Help with stairs or ramp for entrance;Supervision due to cognitive status   Can travel by private vehicle     No  Equipment Recommendations  Hospital bed       Precautions / Restrictions Precautions Precautions: Fall;Other (comment) Recall of Precautions/Restrictions: Impaired Precaution/Restrictions Comments: L eye visual deficits Restrictions Weight Bearing Restrictions Per Provider Order: No     Mobility  Bed Mobility Overal bed mobility: Needs Assistance Bed Mobility: Supine to Sit     Supine to sit: [name] elevated, Min assist, Used rails Sit to supine: Mod assist   General bed mobility comments: Pt able to  manage LE off bed and pull on bed rails to come to EoB, requires min A for pad scoot of hips to EoB, pt requires modA to return LE to bed at end of session    Transfers Overall transfer level: Needs assistance Equipment used: Rolling walker (2 wheels), 2 person hand held assist Transfers: Sit to/from Stand Sit to Stand: +2 physical assistance, Max assist, From elevated surface, Min assist, Mod assist   Step pivot transfers: Min assist, +2 physical assistance       General transfer comment: utilizing bar of turned around [place], pt able to come to standing x5 starting at minAx2 progressing to maxAx2, pt with increased stomach pain with sitting back down on edge of bed each time pt able to take lateral steps towards EoB before returning to supine .          Balance Overall balance assessment: Needs assistance Sitting-balance support: No upper extremity supported Sitting balance-[name]: Fair     Standing balance support: Bilateral upper extremity supported, Reliant on assistive device for balance Standing balance-[name]: Poor Standing balance comment: requires UE support on Stedy bar.                            Communication Communication Communication: No apparent difficulties  Cognition Arousal: Alert Behavior During Therapy: WFL for tasks assessed/performed   PT - Cognitive impairments: Safety/Judgement                         Following commands: Impaired Following commands impaired: Only follows one step commands consistently, Follows one step commands with increased time    Cueing Cueing Techniques:  Verbal cues, Tactile cues, Visual cues  Exercises General Exercises - Lower Extremity Hip Flexion/Marching: AROM, 20 reps, Standing    General Comments General comments (skin integrity, edema, etc.): VSS on RA, son in room throughout session      Pertinent Vitals/Pain Pain Assessment Pain Assessment: Faces Faces Pain Scale: Hurts whole  lot Pain Location: stomach cramping Pain Descriptors / Indicators: Sore, Discomfort Pain Intervention(s): Limited activity within patient's tolerance, Monitored during session, Repositioned     PT Goals (current goals can now be found in the care plan section) Acute Rehab PT Goals PT Goal Formulation: With patient/family Time For Goal Achievement: 11/21/23 Potential to Achieve Goals: Fair Progress towards PT goals: Progressing toward goals    Frequency    Min 2X/week       AM-PAC PT "6 Clicks" Mobility   Outcome Measure  Help needed turning from your back to your side while in a flat bed without using bedrails?: A Little Help needed moving from lying on your back to sitting on the side of a flat bed without using bedrails?: A Lot Help needed moving to and from a bed to a chair (including a wheelchair)?: Total Help needed standing up from a chair using your arms (e.g., wheelchair or bedside chair)?: Total Help needed to walk in hospital room?: Total Help needed climbing 3-5 steps with a railing? : Total 6 Click Score: 9    End of Session Equipment Utilized During Treatment: Gait belt Activity Tolerance: Patient limited by fatigue Patient left: with family/visitor present;in bed;with bed alarm set Nurse Communication: Mobility status;Need for lift equipment PT Visit Diagnosis: Unsteadiness on feet (R26.81);Other abnormalities of gait and mobility (R26.89);Repeated falls (R29.6);Muscle weakness (generalized) (M62.81);History of falling (Z91.81);Difficulty in walking, not elsewhere classified (R26.2) Pain - Right/Left: Right Pain - part of body: Leg     Time: 1610-9604 PT Time Calculation (min) (ACUTE ONLY): 30 min  Charges:    $Therapeutic Exercise: 8-22 mins $Therapeutic Activity: 8-22 mins PT General Charges $$ ACUTE PT VISIT: 1 Visit                     Aluel Schwarz B. [name] PT, DPT Acute Rehabilitation Services Please use secure chat or  Call Office [phone]    [name] [facility] 11/18/2023, 4:26 PM

## 2023-11-18 NOTE — Assessment & Plan Note (Addendum)
 Patient to follow-up with GI outpatient.  No changes to plan.   - Continue 20 mg lactulose  twice daily, titrate prn - platelets continue to fall, will discuss threshold for infusion with Dr. Maria Shiner - fall precautions

## 2023-11-18 NOTE — Assessment & Plan Note (Signed)
 DMII: stable, continue CBG twice daily  HTN: normotensive, holding home BP meds Hypothyroidism; continue synthroid  HLD: restart home statin at discharge Pain: continue home tramadol  prn, voltaren  gel prn  Vit D: supplementing with Vit D daily, will need outpatient vit D level checked

## 2023-11-19 ENCOUNTER — Inpatient Hospital Stay (HOSPITAL_COMMUNITY)

## 2023-11-19 DIAGNOSIS — K7460 Unspecified cirrhosis of liver: Secondary | ICD-10-CM | POA: Diagnosis not present

## 2023-11-19 DIAGNOSIS — K766 Portal hypertension: Secondary | ICD-10-CM | POA: Diagnosis not present

## 2023-11-19 DIAGNOSIS — D649 Anemia, unspecified: Secondary | ICD-10-CM | POA: Insufficient documentation

## 2023-11-19 DIAGNOSIS — D696 Thrombocytopenia, unspecified: Secondary | ICD-10-CM | POA: Diagnosis not present

## 2023-11-19 LAB — CBC WITH DIFFERENTIAL/PLATELET
Abs Immature Granulocytes: 0.01 10*3/uL (ref 0.00–0.07)
Basophils Absolute: 0 10*3/uL (ref 0.0–0.1)
Basophils Relative: 1 %
Eosinophils Absolute: 0.1 10*3/uL (ref 0.0–0.5)
Eosinophils Relative: 3 %
HCT: 27.8 % — ABNORMAL LOW (ref 36.0–46.0)
Hemoglobin: 9.5 g/dL — ABNORMAL LOW (ref 12.0–15.0)
Immature Granulocytes: 1 %
Lymphocytes Relative: 28 %
Lymphs Abs: 0.6 10*3/uL — ABNORMAL LOW (ref 0.7–4.0)
MCH: 37 pg — ABNORMAL HIGH (ref 26.0–34.0)
MCHC: 34.2 g/dL (ref 30.0–36.0)
MCV: 108.2 fL — ABNORMAL HIGH (ref 80.0–100.0)
Monocytes Absolute: 0.2 10*3/uL (ref 0.1–1.0)
Monocytes Relative: 10 %
Neutro Abs: 1.3 10*3/uL — ABNORMAL LOW (ref 1.7–7.7)
Neutrophils Relative %: 57 %
Platelets: 46 10*3/uL — ABNORMAL LOW (ref 150–400)
RBC: 2.57 MIL/uL — ABNORMAL LOW (ref 3.87–5.11)
RDW: 19.7 % — ABNORMAL HIGH (ref 11.5–15.5)
WBC: 2.2 10*3/uL — ABNORMAL LOW (ref 4.0–10.5)
nRBC: 0 % (ref 0.0–0.2)

## 2023-11-19 LAB — COMPREHENSIVE METABOLIC PANEL WITH GFR
ALT: 34 U/L (ref 0–44)
AST: 74 U/L — ABNORMAL HIGH (ref 15–41)
Albumin: <1.5 g/dL — ABNORMAL LOW (ref 3.5–5.0)
Alkaline Phosphatase: 64 U/L (ref 38–126)
Anion gap: 4 — ABNORMAL LOW (ref 5–15)
BUN: 10 mg/dL (ref 8–23)
CO2: 25 mmol/L (ref 22–32)
Calcium: 8.1 mg/dL — ABNORMAL LOW (ref 8.9–10.3)
Chloride: 105 mmol/L (ref 98–111)
Creatinine, Ser: 0.74 mg/dL (ref 0.44–1.00)
GFR, Estimated: >60 mL/min (ref >60)
Glucose, Bld: 88 mg/dL (ref 70–99)
Potassium: 4.1 mmol/L (ref 3.5–5.1)
Sodium: 134 mmol/L — ABNORMAL LOW (ref 135–145)
Total Bilirubin: 2.5 mg/dL — ABNORMAL HIGH (ref 0.0–1.2)
Total Protein: 5.5 g/dL — ABNORMAL LOW (ref 6.5–8.1)

## 2023-11-19 LAB — PROTIME-INR
INR: 1.8 — ABNORMAL HIGH (ref 0.8–1.2)
Prothrombin Time: 20.7 s — ABNORMAL HIGH (ref 11.4–15.2)

## 2023-11-19 LAB — APTT: aPTT: 36 s (ref 24–36)

## 2023-11-19 LAB — BETA 2 MICROGLOBULIN, SERUM: Beta-2 Microglobulin: 2.8 mg/L — ABNORMAL HIGH (ref 0.6–2.4)

## 2023-11-19 LAB — SURGICAL PATHOLOGY

## 2023-11-19 LAB — GLUCOSE, CAPILLARY
Glucose-Capillary: 112 mg/dL — ABNORMAL HIGH (ref 70–99)
Glucose-Capillary: 84 mg/dL (ref 70–99)
Glucose-Capillary: 92 mg/dL (ref 70–99)

## 2023-11-19 MED ORDER — EPOETIN ALFA 40000 UNIT/ML IJ SOLN
40000.0000 [IU] | INTRAMUSCULAR | Status: AC
  Administered 2023-11-19 – 2023-11-25 (×4): 40000 [IU] via SUBCUTANEOUS
  Filled 2023-11-19 (×5): qty 1

## 2023-11-19 NOTE — TOC Progression Note (Signed)
 Transition of Care [facility]) - Progression Note    Patient Details  Name: Brianna Wheeler MRN: [id number] Date of Birth: 05-Dec-1955  Transition of Care [facility]) CM/SW Contact  Graves-Bigelow, Jari Merles, RN Phone Number: 11/19/2023, 3:37 PM  Clinical Narrative: Case Manager received call from Mikie Ales, RN with [facility]. Deatra Face feels like the patient is not a candidate for [facility] at this time; however, she feels that if the patient gets some rehab she may be a candidate once she returns home. Deatra Face is willing to follow the patient at the facility to see how she progresses.      Expected Discharge Plan: Skilled Nursing Facility Barriers to Discharge: Continued Medical Work up  Expected Discharge Plan and Services In-house Referral: Clinical Social Work Discharge Planning Services: CM Consult   Living arrangements for the past 2 months: Single Family Home                   DME Agency: NA  Social Determinants of Health (SDOH) Interventions SDOH Screenings   Food Insecurity: No Food Insecurity (11/07/2023)  Housing: Low Risk  (11/07/2023)  Transportation Needs: No Transportation Needs (11/07/2023)  Utilities: Not At Risk (11/07/2023)  Alcohol Screen: Low Risk  (09/30/2023)  Depression (PHQ2-9): Low Risk  (09/30/2023)  Financial Resource Strain: Low Risk  (09/30/2023)  Physical Activity: Insufficiently Active (09/30/2023)  Social Connections: Moderately Integrated (11/07/2023)  Stress: No Stress Concern Present (09/30/2023)  Tobacco Use: Low Risk  (11/13/2023)  Health Literacy: Adequate Health Literacy (09/30/2023)    Readmission Risk Interventions     No data to display

## 2023-11-19 NOTE — Assessment & Plan Note (Addendum)
 Stable.  - increased lactulose  to 20 mg TID, titrate to 3-4 stools/day - will discuss threshold for platelet infusion with hematology today - consider liver biopsy per heme/onc recs, however GI seems to think this is NASH  - fall precautions

## 2023-11-19 NOTE — Assessment & Plan Note (Addendum)
 Macrocytic. Hg appears stable ~9.5 since admission. Patient had 1-2 month history of "bloody urine" prior to admission. TVUS during admission showed vaginal bleeding with thickened endometrium. Will need outpatient follow up. - Epo per Heme/Onc  - AM CBC

## 2023-11-19 NOTE — Assessment & Plan Note (Addendum)
 Hematology following. Will need outpatient follow up for treatment of suspected myeloma. - heme/onc following, appreciate recs

## 2023-11-19 NOTE — Plan of Care (Signed)
  Problem: Clinical Measurements: Goal: Respiratory complications will improve Outcome: Progressing Goal: Cardiovascular complication will be avoided Outcome: Progressing   Problem: Nutrition: Goal: Adequate nutrition will be maintained Outcome: Progressing   Problem: Pain Managment: Goal: General experience of comfort will improve and/or be controlled Outcome: Progressing   Problem: Safety: Goal: Ability to remain free from injury will improve Outcome: Progressing

## 2023-11-19 NOTE — Assessment & Plan Note (Addendum)
 DMII: stable, continue CBG twice daily HTN: normotensive, holding home BP meds Hypothyroidism: continue home synthroid  HLD: restart home statin at discharge Pain: continue home tramadol  prn Vit D: continue to supplement, will need outpatient Vit D level checked

## 2023-11-19 NOTE — Assessment & Plan Note (Deleted)
 Found on this admission. Stable. Did not anticoagulate per GI and hematology recs. Patient to follow up with GI outpatient.

## 2023-11-19 NOTE — TOC Progression Note (Addendum)
 Transition of Care [facility]) - Progression Note    Patient Details  Name: Brianna Wheeler MRN: [id number] Date of Birth: [birth year]  [facility]) CM/SW Contact  Carmon Christen, LCSWA Phone Number: 11/19/2023, 2:30 PM  Clinical Narrative:     CSW spoke with patient at bedside regarding dc plan. Patient confirmed she is now agreeable to short term rehab at [facility]. Patient does not know of any family or friends currently that she can think of that her son can stay with while she is in short term rehab. Patient informed CSW she will be trying to work on finding someone that can help and CSW informed patient that APS is currently trying to look for extended family as well. CSW will fax patient out for SNF placement once APS able to locate family/disposition for patients son. Patient agreeable to plan.  CSW informed MD. CSW will continue to follow and assist with patients dc planning needs.  Update-CSW attempted to follow up with patient's APS social worker, Marilee Showers [phone]). There was no response and the voicemail box was full. CSW sent HIPAA compliant text to [facility] requesting a return call.   Update- CSW spoke with patient at bedside. Patient gave CSW permission to speak with her church friend dale at bedside. Patient informed CSW that her friend Eddie Good is going to help look into different group homes/placement for her son. CSW spoke with [facility] with APS who is still currently trying to locate extended family. Patient gave permission for APS to help her friend Eddie Good with search if able. Eddie Good tele# [phone].Shonte informed CSW she can bring some resources to patient tomorrow to help with search.    Expected Discharge Plan: Skilled Nursing Facility Barriers to Discharge: Continued Medical Work up  Expected Discharge Plan and Services In-house Referral: Clinical Social Work Discharge Planning Services: CM Consult   Living arrangements for the past 2 months: Single Family Home                    DME Agency: NA                   Social Determinants of Health (SDOH) Interventions SDOH Screenings   Food Insecurity: No Food Insecurity (11/07/2023)  Housing: Low Risk  (11/07/2023)  Transportation Needs: No Transportation Needs (11/07/2023)  Utilities: Not At Risk (11/07/2023)  Alcohol Screen: Low Risk  (09/30/2023)  Depression (PHQ2-9): Low Risk  (09/30/2023)  Financial Resource Strain: Low Risk  (09/30/2023)  Physical Activity: Insufficiently Active (09/30/2023)  Social Connections: Moderately Integrated (11/07/2023)  Stress: No Stress Concern Present (09/30/2023)  Tobacco Use: Low Risk  (11/13/2023)  Health Literacy: Adequate Health Literacy (09/30/2023)    Readmission Risk Interventions     No data to display

## 2023-11-19 NOTE — Assessment & Plan Note (Addendum)
 Foley from 4/19 - 4/23. Continues to void spontaneously. - continue flomax 0.4 mg daily - Strict I/Os

## 2023-11-19 NOTE — Progress Notes (Signed)
     Daily Progress Note Intern Pager: [phone]  Patient name: Brianna Wheeler Medical record number: [id number] Date of birth: Mar 26, 1956 Age: 68 y.o. Gender: female  Primary Care Provider: Kandis Ormond, DO Consultants: Heme/Onc, GI Code Status: Full  Pt Overview and Major Events to Date:  4/16: admitted 4/23: EGD and [facility] completed  Assessment and Plan: Patient is a 68 yo F with PMHx of DMII, HTN, hypothyroidism admitted for AMS likely 2/2 to hepatic encephalopathy vs urosepsis. Patient was new diagnosis of cirrhosis and likely myeloma during admission. Patient is stable for discharge Assessment & Plan Cirrhosis of liver without ascites (HCC) Stable.  - increased lactulose  to 20 mg TID, titrate to 3-4 stools/day - will discuss threshold for platelet infusion with hematology today - consider liver biopsy per heme/onc recs, however GI seems to think this is NASH  - fall precautions Urinary retention Foley from 4/19 - 4/23. Continues to void spontaneously. - continue flomax 0.4 mg daily - Strict I/Os  Gammopathy, monoclonal Hematology following. Will need outpatient follow up for treatment of suspected myeloma. - heme/onc following, appreciate recs Anemia Macrocytic. Hg appears stable ~9.5 since admission. Patient had 1-2 month history of "bloody urine" prior to admission. TVUS during admission showed vaginal bleeding with thickened endometrium. Will need outpatient follow up. - Epo per Heme/Onc  - AM CBC  Chronic health problem DMII: stable, continue CBG twice daily HTN: normotensive, holding home BP meds Hypothyroidism: continue home synthroid  HLD: restart home statin at discharge Pain: continue home tramadol  prn Vit D: continue to supplement, will need outpatient Vit D level checked    FEN/GI: Regular PPx: SCDs for increased bleeding risk  Dispo: SNF vs home, unclear d/t unsafe situation for patients delayed adult son. Discussed this with SW/TOC yesterday but did  not hear back. Will touch base today again.   Subjective:  Complaining of abdominal pain, has not pooped   Objective: Temp:  [97.9 F (36.6 C)] 97.9 F (36.6 C) (04/29 0313) Pulse Rate:  [73-78] 73 (04/29 0313) Resp:  [16-20] 20 (04/29 0313) BP: (106-118)/(52-56) 110/56 (04/29 0313) SpO2:  [94 %-97 %] 94 % (04/29 0313) Physical Exam: General: obese, NAD Cardiovascular: RRR, systolic murmur Respiratory: distended breath sounds, NWOB on RA Abdomen: obese, soft, NTTP Extremities: 2-3+ pitting edema to bilateral lower extremities.  Neuro: strength 5/5 to LLE, and 4/5 RLE  Laboratory: Most recent CBC Lab Results  Component Value Date   WBC 2.2 (L) 11/19/2023   HGB 9.5 (L) 11/19/2023   HCT 27.8 (L) 11/19/2023   MCV 108.2 (H) 11/19/2023   PLT 46 (L) 11/19/2023   Most recent BMP    Latest Ref Rng & Units 11/19/2023    4:41 AM  BMP  Glucose 70 - 99 mg/dL 88   BUN 8 - 23 mg/dL 10   Creatinine 1.47 - 1.00 mg/dL 8.29   Sodium 562 - 130 mmol/L 134   Potassium 3.5 - 5.1 mmol/L 4.1   Chloride 98 - 111 mmol/L 105   CO2 22 - 32 mmol/L 25   Calcium  8.9 - 10.3 mg/dL 8.1    Imaging/Diagnostic Tests: No new imaging  Farris Hong, MD 11/19/2023, 7:11 AM  PGY-1, [facility] FPTS Intern pager: [phone], text pages welcome Secure chat group [facility] Teaching Service

## 2023-11-19 NOTE — Assessment & Plan Note (Deleted)
 Patient c/o of blood in urine. Pelvic exam completed yesterday showed blood clots in vaginal vault. TVUS showed abnormal endometrial thickening 15 mm.  - will need outpatient follow up and endometrial biopsy

## 2023-11-19 NOTE — Assessment & Plan Note (Deleted)
 Resolved. Completed full dose of antibiotics (4/16 - 4/22)

## 2023-11-19 NOTE — Assessment & Plan Note (Deleted)
 Vit D deficient. Both Vit D, 25 hydroxy and Vit D, 1, 25 Hydroxy - will start patient on daily Vit D and have patient follow up Vit D 1,25 Hydroxy outpatient to follow up to ensure no associated renal disease causing Vit D deficiency

## 2023-11-19 NOTE — Progress Notes (Signed)
 Overall, seems like the bleeding might be a little bit better.  Her platelet count certainly has come up a little bit.  I do not think she was transfused yesterday.  Her sodium 134.  Potassium 4.1.  BUN 10 creatinine 0.74.  Calcium  8.1 with an albumin of less than 1.5.  SGPT 34 SGOT 74.  Bilirubin is 2.5.  Her white cell count is 2.2.  Hemoglobin 9.5.  Platelet count 46,000.  Again, it is hard to say if she is still bleeding.  She definitely needs to have gynecologic evaluation for the endometrial thickening.  She is eating a little bit better.  She is having no nausea or vomiting.  I will see about giving her some Procrit  shots to help with her hemoglobin.  I spoke to pathology yesterday.  They will add a Congo Red stain to the stomach biopsies to see if there is any amyloid.  I would recheck her coagulation parameters to see if they are off.  Again, she has to have some type of intrinsic liver disease to have such a low albumin.  Again, doing a liver biopsy would certainly be risky given her thrombocytopenia.  However, it could certainly happen.  All we would have to do just give her some platelets right before the biopsy.  We will continue to follow along.  I know she is getting great care from everybody upon 4 E.   Rayleen Cal, MD  [place] 3:19

## 2023-11-19 NOTE — Assessment & Plan Note (Deleted)
 Resolved. At Baseline. - GI following, appreciate recs - continue 20 mg lactulose  BID - fall precautions - AM CBC, CMP

## 2023-11-20 ENCOUNTER — Encounter (HOSPITAL_COMMUNITY): Payer: Self-pay | Admitting: Hematology & Oncology

## 2023-11-20 DIAGNOSIS — K7460 Unspecified cirrhosis of liver: Secondary | ICD-10-CM | POA: Diagnosis not present

## 2023-11-20 LAB — COMPREHENSIVE METABOLIC PANEL WITH GFR
ALT: 32 U/L (ref 0–44)
AST: 62 U/L — ABNORMAL HIGH (ref 15–41)
Albumin: <1.5 g/dL — ABNORMAL LOW (ref 3.5–5.0)
Alkaline Phosphatase: 62 U/L (ref 38–126)
Anion gap: 3 — ABNORMAL LOW (ref 5–15)
BUN: 10 mg/dL (ref 8–23)
CO2: 26 mmol/L (ref 22–32)
Calcium: 7.9 mg/dL — ABNORMAL LOW (ref 8.9–10.3)
Chloride: 104 mmol/L (ref 98–111)
Creatinine, Ser: 0.8 mg/dL (ref 0.44–1.00)
GFR, Estimated: >60 mL/min (ref >60)
Glucose, Bld: 85 mg/dL (ref 70–99)
Potassium: 4.1 mmol/L (ref 3.5–5.1)
Sodium: 133 mmol/L — ABNORMAL LOW (ref 135–145)
Total Bilirubin: 3 mg/dL — ABNORMAL HIGH (ref 0.0–1.2)
Total Protein: 5.4 g/dL — ABNORMAL LOW (ref 6.5–8.1)

## 2023-11-20 LAB — CBC WITH DIFFERENTIAL/PLATELET
Abs Immature Granulocytes: 0.02 10*3/uL (ref 0.00–0.07)
Basophils Absolute: 0 10*3/uL (ref 0.0–0.1)
Basophils Relative: 1 %
Eosinophils Absolute: 0.1 10*3/uL (ref 0.0–0.5)
Eosinophils Relative: 3 %
HCT: 28.1 % — ABNORMAL LOW (ref 36.0–46.0)
Hemoglobin: 9.3 g/dL — ABNORMAL LOW (ref 12.0–15.0)
Immature Granulocytes: 1 %
Lymphocytes Relative: 24 %
Lymphs Abs: 1 10*3/uL (ref 0.7–4.0)
MCH: 36.3 pg — ABNORMAL HIGH (ref 26.0–34.0)
MCHC: 33.1 g/dL (ref 30.0–36.0)
MCV: 109.8 fL — ABNORMAL HIGH (ref 80.0–100.0)
Monocytes Absolute: 0.4 10*3/uL (ref 0.1–1.0)
Monocytes Relative: 9 %
Neutro Abs: 2.7 10*3/uL (ref 1.7–7.7)
Neutrophils Relative %: 62 %
Platelets: 60 10*3/uL — ABNORMAL LOW (ref 150–400)
RBC: 2.56 MIL/uL — ABNORMAL LOW (ref 3.87–5.11)
RDW: 19.9 % — ABNORMAL HIGH (ref 11.5–15.5)
WBC: 4.3 10*3/uL (ref 4.0–10.5)
nRBC: 0 % (ref 0.0–0.2)

## 2023-11-20 LAB — GLUCOSE, CAPILLARY
Glucose-Capillary: 86 mg/dL (ref 70–99)
Glucose-Capillary: 85 mg/dL (ref 70–99)
Glucose-Capillary: 97 mg/dL (ref 70–99)

## 2023-11-20 NOTE — Progress Notes (Signed)
 Occupational Therapy Treatment Patient Details Name: Brianna Wheeler MRN: [id number] DOB: 20-Dec-1955 Today's Date: 11/20/2023   History of present illness Pt is a 68 y/o female presenting after a fall at home and AMS. Pt admitted for acute encephalopathy, sepsis and BLE/abdominal edema. LP 4/17. PMH: HTN, hypothyroidism, prediabetes, hyperlipidemia, osteoarthritis, GERD, asthma   OT comments  Patient seen with PT to address sit to stands to increase independence with functional transfers. Patient demonstrating gains with bed mobility with CGA to get to EOB. Patient able to stand from EOB to [facility] with mod assist +2 and stood for peri area bottom cleaning. Patient stood second time with min assist +2 from raised bed and a third time to RW and was able to take side steps towards [name]. Patient returned to supine with mod assist due to assistance needed with BLEs. Patient will benefit from continued inpatient follow up therapy, <3 hours/day. Acute OT to continue to follow to address established goals to facilitate DC to next venue of care.       If plan is discharge home, recommend the following:  Two people to help with walking and/or transfers;Two people to help with bathing/dressing/bathroom;Assistance with cooking/housework;Direct supervision/assist for medications management;Direct supervision/assist for financial management;Assist for transportation;Help with stairs or ramp for entrance   Equipment Recommendations  Hoyer lift;Hospital bed    Recommendations for Other Services      Precautions / Restrictions Precautions Precautions: Fall;Other (comment) Recall of Precautions/Restrictions: Impaired Precaution/Restrictions Comments: L eye visual deficits Restrictions Weight Bearing Restrictions Per Provider Order: No       Mobility Bed Mobility Overal bed mobility: Needs Assistance Bed Mobility: Supine to Sit, Sit to Supine     Supine to sit: [name] elevated, Contact guard Sit to  supine: Mod assist   General bed mobility comments: increased time and CGA to get from supine to EOB. Patient requiring assistance with BLEs to return to supine    Transfers Overall transfer level: Needs assistance Equipment used: Rolling walker (2 wheels), Ambulation equipment used Transfers: Sit to/from Stand Sit to Stand: Mod assist, +2 physical assistance           General transfer comment: Stood from EOB with use of back of Stedy with mod assist +2 first attempt and min assist +2 second attempt. Performed last stand from EOB to RW with mod assist +2 and was able to take side steps towards [facility]     Balance Overall balance assessment: Needs assistance Sitting-balance support: No upper extremity supported Sitting balance-[name]: Fair     Standing balance support: Bilateral upper extremity supported, Reliant on assistive device for balance Standing balance-[name]: Poor Standing balance comment: reliant on external support                           ADL either performed or assessed with clinical judgement   ADL Overall ADL's : Needs assistance/impaired     Grooming: Set up;Sitting Grooming Details (indicate cue type and reason): EOB     Lower Body Bathing: Maximal assistance;+2 for physical assistance;Sit to/from stand Lower Body Bathing Details (indicate cue type and reason): stood from EOB using back of Stedy with assistance of one for balance and max assist to clean peri area bottom.                            Extremity/Trunk Assessment  Vision       Perception     Praxis     Communication Communication Communication: No apparent difficulties   Cognition Arousal: Alert Behavior During Therapy: WFL for tasks assessed/performed Cognition: Cognition impaired     Awareness: Intellectual awareness impaired Memory impairment (select all impairments): Short-term memory, Declarative long-term memory, Working  memory Attention impairment (select first level of impairment): Selective attention Executive functioning impairment (select all impairments): Organization, Reasoning, Problem solving                   Following commands: Impaired Following commands impaired: Only follows one step commands consistently, Follows one step commands with increased time      Cueing   Cueing Techniques: Verbal cues, Tactile cues, Visual cues  Exercises      Shoulder Instructions       General Comments VSS on RA    Pertinent Vitals/ Pain       Pain Assessment Pain Assessment: Faces Faces Pain Scale: Hurts little more Pain Location: stomach cramping Pain Descriptors / Indicators: Sore, Discomfort Pain Intervention(s): Limited activity within patient's tolerance, Monitored during session, Repositioned  Home Living                                          Prior Functioning/Environment              Frequency  Min 2X/week        Progress Toward Goals  OT Goals(current goals can now be found in the care plan section)  Progress towards OT goals: Progressing toward goals  Acute Rehab OT Goals Patient Stated Goal: to return home after getting rehab OT Goal Formulation: With patient Time For Goal Achievement: 11/21/23 Potential to Achieve Goals: Good ADL Goals Pt Will Perform Lower Body Bathing: with mod assist;sit to/from stand;with adaptive equipment;sitting/lateral leans Pt Will Transfer to Toilet: with mod assist;with +2 assist;stand pivot transfer;bedside commode Pt/caregiver will Perform Home Exercise Program: Increased strength;Both right and left upper extremity;With theraband;With written HEP provided Additional ADL Goal #1: Pt to complete bed mobility with Min A in prep for EOB ADLs  Plan      Co-evaluation    PT/OT/SLP Co-Evaluation/Treatment: Yes Reason for Co-Treatment: For patient/therapist safety;To address functional/ADL transfers PT goals  addressed during session: Mobility/safety with mobility OT goals addressed during session: ADL's and self-care      AM-PAC OT "6 Clicks" Daily Activity     Outcome Measure   Help from another person eating meals?: A Little Help from another person taking care of personal grooming?: A Little Help from another person toileting, which includes using toliet, bedpan, or urinal?: Total Help from another person bathing (including washing, rinsing, drying)?: A Lot Help from another person to put on and taking off regular upper body clothing?: A Little Help from another person to put on and taking off regular lower body clothing?: Total 6 Click Score: 13    End of Session Equipment Utilized During Treatment: Gait belt;Rolling walker (2 wheels);Other (comment) Octaviano Belts)  OT Visit Diagnosis: Muscle weakness (generalized) (M62.81);Other (comment) (decreased activity tolerance)   Activity Tolerance Patient tolerated treatment well   Patient Left in bed;with call bell/phone within reach;with bed alarm set   Nurse Communication Mobility status        Time: 1610-9604 OT Time Calculation (min): 26 min  Charges: OT General Charges $OT Visit: 1 Visit OT Treatments $  Self Care/Home Management : 8-22 mins  Anitra Barn, OTA Acute Rehabilitation Services  Office [phone]   Jovita Nipper 11/20/2023, 2:26 PM

## 2023-11-20 NOTE — Assessment & Plan Note (Deleted)
 Patient c/o of blood in urine. Pelvic exam completed yesterday showed blood clots in vaginal vault. TVUS showed abnormal endometrial thickening 15 mm.  - will need outpatient follow up and endometrial biopsy

## 2023-11-20 NOTE — Assessment & Plan Note (Addendum)
 DMII: stable, continue CBG twice daily HTN: normotensive, holding home BP meds Hypothyroidism: continue home synthroid  HLD: restart home statin at discharge Pain: continue home tramadol  prn Vit D: continue to supplement, will need outpatient Vit D level checked  Anemia: Hg appears stable. Will need outpatient w/u of postmenopausal bleeding. Epo per heme/onc recs

## 2023-11-20 NOTE — Assessment & Plan Note (Addendum)
 Foley from 4/19 - 4/23. Continues to void spontanously.  - continue flomax 0.4 mg daily - Strict I/Os

## 2023-11-20 NOTE — Progress Notes (Addendum)
 RE: Brianna Wheeler  Date of Birth: July 20, 1956  Date: 11/20/2023    To Whom It May Concern:   Please be advised that the above-named patient will require a short-term nursing home stay - anticipated 30 days or less for rehabilitation and strengthening. The plan is for return home.

## 2023-11-20 NOTE — Progress Notes (Signed)
     Daily Progress Note Intern Pager: [phone]  Patient name: Brianna Wheeler Medical record number: [id number] Date of birth: [birth year] Age: 68 y.o. Gender: female  Primary Care Provider: Kandis Ormond, DO Consultants: Heme/Onc, GI Code Status: Full  Pt Overview and Major Events to Date:  4/16: admitted 4/23: EGD and [facility] completed   Assessment and Plan: Patient is a 68 yo F with PMHx of DMII, HTN, hypothyroidism admitted for hepatic encephalopathy vs urosepsis. Patient also diagnosed with myeloma during admission. Patient is now medically stable for discharge.  Assessment & Plan Cirrhosis of liver without ascites (HCC) Stable - continue lactulose  20 mg TID, titrate to 3-4 stools/day - given amyloidosis stain negative, will hold off on liver biopsy until patient follows up with GI outpatient - fall precautions  Urinary retention Foley from 4/19 - 4/23. Continues to void spontanously.  - continue flomax 0.4 mg daily - Strict I/Os Gammopathy, monoclonal Heme following. Will need outpatient f/u for treatment of myeloma. - heme/onc following, appreciate recs Hematology following. Will need outpatient follow up for treatment of suspected myeloma.  Chronic health problem DMII: stable, continue CBG twice daily HTN: normotensive, holding home BP meds Hypothyroidism: continue home synthroid  HLD: restart home statin at discharge Pain: continue home tramadol  prn Vit D: continue to supplement, will need outpatient Vit D level checked  Anemia: Hg appears stable. Will need outpatient w/u of postmenopausal bleeding. Epo per heme/onc recs    FEN/GI: Regular PPx: SCDs for increased bleeding risk  Dispo: SNF pending placement. Discussed with her this AM. She is willing to go to SNF and have her son stay with neighbor in the meantime. Will discuss with SW to ensure this is safe dispo per APS  Subjective:  Abdominal pain improving   Objective: Temp:  [98.1 F (36.7 C)-98.3 F (36.8  C)] 98.2 F (36.8 C) (04/30 0852) Pulse Rate:  [77-81] 77 (04/30 0852) Resp:  [20] 20 (04/30 0852) BP: (106-123)/(48-75) 106/48 (04/30 0852) SpO2:  [95 %-98 %] 98 % (04/30 0852) Physical Exam: General: obese, NAD Cardiovascular: RRR, systolic murmur Respiratory: NWOB on RA Abdomen: soft, NTTP Extremities: 3+ pitting edema to bilateral extremities   Laboratory: Most recent CBC Lab Results  Component Value Date   WBC 4.3 11/20/2023   HGB 9.3 (L) 11/20/2023   HCT 28.1 (L) 11/20/2023   MCV 109.8 (H) 11/20/2023   PLT 60 (L) 11/20/2023   Most recent BMP    Latest Ref Rng & Units 11/20/2023    4:58 AM  BMP  Glucose 70 - 99 mg/dL 85   BUN 8 - 23 mg/dL 10   Creatinine 3.08 - 1.00 mg/dL 6.57   Sodium 846 - 962 mmol/L 133   Potassium 3.5 - 5.1 mmol/L 4.1   Chloride 98 - 111 mmol/L 104   CO2 22 - 32 mmol/L 26   Calcium  8.9 - 10.3 mg/dL 7.9     Imaging/Diagnostic Tests: No new imaging Farris Hong, MD 11/20/2023, 9:01 AM  PGY-1, [facility] FPTS Intern pager: [phone], text pages welcome Secure chat group [facility] Teaching Service

## 2023-11-20 NOTE — Assessment & Plan Note (Deleted)
 Macrocytic. Hg appears stable ~9.5 since admission. Patient had 1-2 month history of "bloody urine" prior to admission. TVUS during admission showed vaginal bleeding with thickened endometrium. Will need outpatient follow up. - Epo per Heme/Onc  - AM CBC

## 2023-11-20 NOTE — Assessment & Plan Note (Addendum)
 Stable - continue lactulose  20 mg TID, titrate to 3-4 stools/day - given amyloidosis stain negative, will hold off on liver biopsy until patient follows up with GI outpatient - fall precautions

## 2023-11-20 NOTE — Assessment & Plan Note (Deleted)
 Vit D deficient. Both Vit D, 25 hydroxy and Vit D, 1, 25 Hydroxy - will start patient on daily Vit D and have patient follow up Vit D 1,25 Hydroxy outpatient to follow up to ensure no associated renal disease causing Vit D deficiency

## 2023-11-20 NOTE — Assessment & Plan Note (Deleted)
 Resolved. At Baseline. - GI following, appreciate recs - continue 20 mg lactulose  BID - fall precautions - AM CBC, CMP

## 2023-11-20 NOTE — Progress Notes (Signed)
 Physical Therapy Treatment Patient Details Name: Brianna Wheeler MRN: [id number] DOB: 08-Dec-1955 Today's Date: 11/20/2023   History of Present Illness Pt is a 68 y/o female presenting after a fall at home and AMS. Pt admitted for acute encephalopathy, sepsis and BLE/abdominal edema. LP 4/17. PMH: HTN, hypothyroidism, prediabetes, hyperlipidemia, osteoarthritis, GERD, asthma    PT Comments  Pt supine in bed on entry, son in room, agreeable to therapy. Initiated power up to [name] turned towards bed to improve space to come to standing from elevated bed surface. Son was happy to sit on [name] as counterbalance. Pt able to come to standing x2 with Stedy and 1 additional time to RW with modAx2. Once in  standing pt able to take lateral steps towards [name]. Pt reports feeling sick and requests return to bed. Pt requires total X2 for returning to supine. D/c plan remains appropriate. Pt tells therapist that her neighbor would be able to help her son until she gets back from rehab. After session, PT/OT spoke with LCSW/CM and agreed that son will likely require more than delivery of meals in her absence. APS to come and speak with patient and son.     If plan is discharge home, recommend the following: Two people to help with walking and/or transfers;Two people to help with bathing/dressing/bathroom;Assistance with cooking/housework;Assistance with feeding;Direct supervision/assist for medications management;Direct supervision/assist for financial management;Assist for transportation;Help with stairs or ramp for entrance;Supervision due to cognitive status   Can travel by private vehicle     No  Equipment Recommendations  Hospital bed       Precautions / Restrictions Precautions Precautions: Fall;Other (comment) Recall of Precautions/Restrictions: Impaired Precaution/Restrictions Comments: L eye visual deficits Restrictions Weight Bearing Restrictions Per Provider Order: No     Mobility  Bed  Mobility Overal bed mobility: Needs Assistance Bed Mobility: Supine to Sit, Sit to Supine     Supine to sit: [name] elevated, Contact guard Sit to supine: Mod assist   General bed mobility comments: increased time and CGA to get from supine to EOB. Patient requiring assistance with BLEs to return to supine    Transfers Overall transfer level: Needs assistance Equipment used: Rolling walker (2 wheels), Ambulation equipment used Transfers: Sit to/from Stand Sit to Stand: Mod assist, +2 physical assistance          Lateral/Scoot Transfers: +2 physical assistance, +2 safety/equipment, Mod assist General transfer comment: Stood from EOB with use of back of Stedy with mod assist +2 first attempt and min assist +2 second attempt. Performed last stand from EOB to RW with mod assist +2 and was able to take side steps towards [name]      Balance Overall balance assessment: Needs assistance Sitting-balance support: No upper extremity supported Sitting balance-[name]: Fair     Standing balance support: Bilateral upper extremity supported, Reliant on assistive device for balance Standing balance-[name]: Poor Standing balance comment: reliant on external support                            Communication Communication Communication: No apparent difficulties  Cognition Arousal: Alert Behavior During Therapy: WFL for tasks assessed/performed   PT - Cognitive impairments: Safety/Judgement                         Following commands: Impaired Following commands impaired: Only follows one step commands consistently, Follows one step commands with increased time    Cueing  Cueing Techniques: Verbal cues, Tactile cues, Visual cues     General Comments General comments (skin integrity, edema, etc.): VSS on RA      Pertinent Vitals/Pain Pain Assessment Pain Assessment: Faces Faces Pain Scale: Hurts little more Pain Location: stomach cramping Pain Descriptors /  Indicators: Sore, Discomfort Pain Intervention(s): Limited activity within patient's tolerance, Monitored during session, Repositioned     PT Goals (current goals can now be found in the care plan section) Acute Rehab PT Goals PT Goal Formulation: With patient/family Time For Goal Achievement: 11/21/23 Potential to Achieve Goals: Fair Progress towards PT goals: Progressing toward goals    Frequency    Min 2X/week      PT Plan      Co-evaluation PT/OT/SLP Co-Evaluation/Treatment: Yes Reason for Co-Treatment: For patient/therapist safety;To address functional/ADL transfers PT goals addressed during session: Mobility/safety with mobility OT goals addressed during session: ADL's and self-care      AM-PAC PT "6 Clicks" Mobility   Outcome Measure  Help needed turning from your back to your side while in a flat bed without using bedrails?: A Little Help needed moving from lying on your back to sitting on the side of a flat bed without using bedrails?: A Lot Help needed moving to and from a bed to a chair (including a wheelchair)?: Total Help needed standing up from a chair using your arms (e.g., wheelchair or bedside chair)?: Total Help needed to walk in hospital room?: Total Help needed climbing 3-5 steps with a railing? : Total 6 Click Score: 9    End of Session Equipment Utilized During Treatment: Gait belt Activity Tolerance: Patient limited by fatigue Patient left: with family/visitor present;in bed;with bed alarm set Nurse Communication: Mobility status;Need for lift equipment PT Visit Diagnosis: Unsteadiness on feet (R26.81);Other abnormalities of gait and mobility (R26.89);Repeated falls (R29.6);Muscle weakness (generalized) (M62.81);History of falling (Z91.81);Difficulty in walking, not elsewhere classified (R26.2) Pain - Right/Left: Right Pain - part of body: Leg     Time: 6967-8938 PT Time Calculation (min) (ACUTE ONLY): 27 min  Charges:    $Therapeutic  Activity: 8-22 mins PT General Charges $$ ACUTE PT VISIT: 1 Visit                     Lochlan Grygiel B. [name] PT, DPT Acute Rehabilitation Services Please use secure chat or  Call Office [phone]    [name] [facility] 11/20/2023, 2:33 PM

## 2023-11-20 NOTE — Assessment & Plan Note (Deleted)
 Found on this admission. Stable. Did not anticoagulate per GI and hematology recs. Patient to follow up with GI outpatient.

## 2023-11-20 NOTE — Plan of Care (Signed)
   Problem: Safety: Goal: Non-violent Restraint(s) Outcome: Progressing   Problem: Education: Goal: Knowledge of General Education information will improve Description: Including pain rating scale, medication(s)/side effects and non-pharmacologic comfort measures Outcome: Progressing   Problem: Health Behavior/Discharge Planning: Goal: Ability to manage health-related needs will improve Outcome: Progressing   Problem: Clinical Measurements: Goal: Ability to maintain clinical measurements within normal limits will improve Outcome: Progressing Goal: Will remain free from infection Outcome: Progressing Goal: Diagnostic test results will improve Outcome: Progressing Goal: Respiratory complications will improve Outcome: Progressing Goal: Cardiovascular complication will be avoided Outcome: Progressing

## 2023-11-20 NOTE — TOC Progression Note (Addendum)
 Transition of Care [facility]) - Progression Note    Patient Details  Name: Brianna Wheeler MRN: [id number] Date of Birth: Apr 26, 1956  Transition of Care [facility]) CM/SW Contact  Carmon Christen, LCSWA Phone Number: 11/20/2023, 11:40 AM  Clinical Narrative:     Antonio with financial counseling informed CSW that patient was screened for medicaid and that patient  is over income for eligibility. CSW left message for [facility] with APS to follow up to see when she will be by to drop off resources for patient. CSW awaiting call back.  Update- Patients passr is pending. CSW awaiting 30 day note and FL2 to be cosigned by MD then can submit requested clinicals to [facility] must for review.   Update- CSW submitted clinicals to  must for review. Patients passr currently pending.  Update- APS social worker Marilee Showers called CSW back and met with CSW and patient at bedside. Shonte brought resources for patient to help with placement for son. Patient agreeable for church friend dale to receive resources. Patient informed APS social worker that she thought about plan last night and has decided she does not want her son going to a group home.Patient informed  APS social worker she would like for her son to return home. Patient informed APS social worker that she has a neighbor that says they can help with her son if needed while she is at short term rehab. Patient reports he works during the week and is off on the weekend. Patient reports she can get other neighbors to check on her son the hours her neighbor is at work during the day. APS social worker request for patient to gather names and contacts of neighbors so that she can call to confirm safe plan for her son. Patient confirmed she will work on getting names and numbers together for APS Child psychotherapist. APS social worker plans to come back tomorrow to follow up with patient on plan for her son and to also complete assessment with patients son.APS social workers plans to follow  up with CSW tomorrow after meeting with patient and son.  Expected Discharge Plan: Skilled Nursing Facility Barriers to Discharge: Continued Medical Work up  Expected Discharge Plan and Services In-house Referral: Clinical Social Work Discharge Planning Services: CM Consult   Living arrangements for the past 2 months: Single Family Home                   DME Agency: NA                   Social Determinants of Health (SDOH) Interventions SDOH Screenings   Food Insecurity: No Food Insecurity (11/07/2023)  Housing: Low Risk  (11/07/2023)  Transportation Needs: No Transportation Needs (11/07/2023)  Utilities: Not At Risk (11/07/2023)  Alcohol Screen: Low Risk  (09/30/2023)  Depression (PHQ2-9): Low Risk  (09/30/2023)  Financial Resource Strain: Low Risk  (09/30/2023)  Physical Activity: Insufficiently Active (09/30/2023)  Social Connections: Moderately Integrated (11/07/2023)  Stress: No Stress Concern Present (09/30/2023)  Tobacco Use: Low Risk  (11/13/2023)  Health Literacy: Adequate Health Literacy (09/30/2023)    Readmission Risk Interventions     No data to display

## 2023-11-20 NOTE — Plan of Care (Signed)
  Problem: Clinical Measurements: Goal: Respiratory complications will improve Outcome: Progressing Goal: Cardiovascular complication will be avoided Outcome: Progressing   Problem: Activity: Goal: Risk for activity intolerance will decrease Outcome: Progressing   Problem: Nutrition: Goal: Adequate nutrition will be maintained Outcome: Progressing   Problem: Safety: Goal: Ability to remain free from injury will improve Outcome: Progressing   

## 2023-11-20 NOTE — Assessment & Plan Note (Addendum)
 Heme following. Will need outpatient f/u for treatment of myeloma. - heme/onc following, appreciate recs Hematology following. Will need outpatient follow up for treatment of suspected myeloma.

## 2023-11-20 NOTE — Assessment & Plan Note (Deleted)
 Resolved. Completed full dose of antibiotics (4/16 - 4/22)

## 2023-11-20 NOTE — [place] FL2 (Signed)
 [place]  MEDICAID FL2 LEVEL OF CARE FORM     IDENTIFICATION  Patient Name: Brianna Wheeler Birthdate: 01-11-1956 Sex: female Admission Date (Current Location): 11/06/2023  [facility] and [place] Number:  Producer, television/film/video and Address:  [facility], [address], [place], [place] [address]      Provider Number: [id number]  Attending Physician Name and Address:  Charmel Cooter, MD  Relative Name and Phone Number:       Current Level of Care: Hospital Recommended Level of Care: Skilled Nursing Facility Prior Approval Number:    Date Approved/Denied:   PASRR Number: PASRR under review  Discharge Plan: SNF    Current Diagnoses: Patient Active Problem List   Diagnosis Date Noted   Anemia 11/19/2023   Vaginal bleeding 11/16/2023   Urinary retention 11/14/2023   Vitamin D  deficiency 11/11/2023   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   Hyperphosphatemia 11/08/2023   High serum transferrin saturation 11/08/2023   Hepatic cirrhosis (HCC) 11/08/2023   Pressure injury of buttock, stage 1 11/07/2023   Cirrhosis of liver without ascites (HCC) 11/07/2023   Hepatic encephalopathy (HCC) 11/06/2023   E. coli UTI (urinary tract infection) 11/06/2023   Chronic health problem 11/06/2023   Hyperbilirubinemia with Jaundice 11/06/2023   Portal venous thrombus 11/06/2023   Swelling of both lower extremities 11/06/2023   Portal hypertension (HCC) 11/06/2023   Possible Portal vein/Splenic vein thrombosis on CT 11/06/2023   Anasarca 11/06/2023   Left eye Retinal Detachment 11/06/2023   Calcification of lens, Left 11/06/2023   Eosinophilia, unspecified 11/06/2023   Thrombocytopenia (HCC) 11/06/2023   Gammopathy, monoclonal 11/06/2023   Systolic murmur 11/06/2023   Right knee pain 04/29/2023   Tremor of both hands 11/02/2022   Headache 04/05/2022   Frequent falls 09/13/2021   Hypokalemia 07/28/2021   Morbid obesity (HCC) 12/08/2020   Adjustment reaction with  mixed emotional features 09/12/2020   Serum gammaglobulin increased 02/11/2019   Chronic venous insufficiency 01/28/2019   Nocturia 01/28/2019   GERD (gastroesophageal reflux disease) 05/01/2016   Vertebral compression fracture (HCC) 12/09/2015   Metatarsalgia of both feet 09/20/2015   Allergic rhinitis 08/30/2015   Transaminitis 04/29/2015   Cataract 01/01/2013   Back pain 06/10/2012   Colon cancer screening 02/11/2012   Asthma 12/07/2011   Depression 05/10/2011   Chronic sinusitis 09/20/2010   Osteoarthritis 04/07/2009   Hypothyroidism 03/03/2008   Lumbar back pain with radiculopathy affecting right lower extremity 10/03/2007   Prediabetes 05/08/2007   HLD (hyperlipidemia) 09/19/2006   HYPERTENSION, BENIGN SYSTEMIC 09/19/2006    Orientation RESPIRATION BLADDER Height & Weight     Self, Time, Situation, Place  Normal Continent, External catheter (External Urinary Catheter) Weight: (!) 306 lb 7 oz (139 kg) Height:  5\' 11"  (180.3 cm) (per previous hx)  BEHAVIORAL SYMPTOMS/MOOD NEUROLOGICAL BOWEL NUTRITION STATUS      Incontinent Diet (Please see discharge summary)  AMBULATORY STATUS COMMUNICATION OF NEEDS Skin   Extensive Assist Verbally Other (Comment) (Ecchymosis,arm,Bil.,R,L,Erythema,Buttocks,Bil.,R,L,Wound/Incision LDAs,PI Buttocks mid stage 1,Wound/Incision open or dehisced puncture,back,lower)                       Personal Care Assistance Level of Assistance  Bathing, Feeding, Dressing Bathing Assistance: Maximum assistance Feeding assistance: Limited assistance Dressing Assistance: Maximum assistance     Functional Limitations Info  Sight, Hearing, Speech Sight Info: Impaired Hearing Info: Adequate Speech Info: Adequate    SPECIAL CARE FACTORS FREQUENCY  PT (By licensed PT), OT (By licensed  OT)     PT Frequency: 5x min weekly OT Frequency: 5x min weekly            Contractures Contractures Info: Not present    Additional Factors Info  Code  Status, Allergies Code Status Info: FULL Allergies Info: Hydrocodone -acetaminophen ,Nsaids,Oxycodone-acetaminophen ,Tetracycline,Bactrim  (sulfamethoxazole -trimethoprim )           Current Medications (11/20/2023):  This is the current hospital active medication list Current Facility-Administered Medications  Medication Dose Route Frequency Provider Last Rate Last Admin   acetaminophen  (TYLENOL ) tablet 650 mg  650 mg Oral Q8H PRN Charmel Cooter, MD       Chlorhexidine  Gluconate Cloth 2 % PADS 6 each  6 each Topical Daily McDiarmid, Demetra Filter, MD   6 each at 11/20/23 1000   cholecalciferol  (VITAMIN D3) 25 MCG (1000 UNIT) tablet 1,000 Units  1,000 Units Oral Daily McDiarmid, Demetra Filter, MD   1,000 Units at 11/20/23 1011   diclofenac  Sodium (VOLTAREN ) 1 % topical gel 2 g  2 g Topical QID PRN Derril Flint, Mahnoor, MD   2 g at 11/19/23 0819   epoetin alfa (EPOGEN) injection 40,000 Units  40,000 Units Subcutaneous QODAY Ennever, Peter R, MD   40,000 Units at 11/19/23 1037   folic acid  (FOLVITE ) tablet 2 mg  2 mg Oral Daily Ennever, Peter R, MD   2 mg at 11/20/23 1011   Gerhardt's butt cream   Topical Daily McDiarmid, Demetra Filter, MD   Given at 11/20/23 1012   lactulose  (CHRONULAC ) 10 GM/15ML solution 20 g  20 g Oral TID Derril Flint, Mahnoor, MD   20 g at 11/20/23 1013   levothyroxine  (SYNTHROID ) tablet 125 mcg  125 mcg Oral Q0600 Quillen, Michael, MD   125 mcg at 11/20/23 0505   ondansetron  (ZOFRAN -ODT) disintegrating tablet 4 mg  4 mg Oral Q8H PRN Quillen, Michael, MD   4 mg at 11/12/23 1059   Oral care mouth rinse  15 mL Mouth Rinse PRN McDiarmid, Demetra Filter, MD       pantoprazole  (PROTONIX ) EC tablet 40 mg  40 mg Oral Daily Quillen, Michael, MD   40 mg at 11/20/23 1011   sodium chloride  flush (NS) 0.9 % injection 10-40 mL  10-40 mL Intracatheter Q12H McDiarmid, Demetra Filter, MD   10 mL at 11/20/23 1914   sodium chloride  flush (NS) 0.9 % injection 10-40 mL  10-40 mL Intracatheter PRN McDiarmid, Demetra Filter, MD       tamsulosin (FLOMAX)  capsule 0.4 mg  0.4 mg Oral Daily Clem Currier, DO   0.4 mg at 11/20/23 1011   traMADol  (ULTRAM ) tablet 50 mg  50 mg Oral Q12H PRN Derril Flint, Mahnoor, MD   50 mg at 11/19/23 1626     Discharge Medications: Please see discharge summary for a list of discharge medications.  Relevant Imaging Results:  Relevant Lab Results:   Additional Information [id number]  Carmon Christen, LCSWA

## 2023-11-21 DIAGNOSIS — D472 Monoclonal gammopathy: Secondary | ICD-10-CM

## 2023-11-21 DIAGNOSIS — N939 Abnormal uterine and vaginal bleeding, unspecified: Secondary | ICD-10-CM

## 2023-11-21 LAB — GLUCOSE, CAPILLARY
Glucose-Capillary: 139 mg/dL — ABNORMAL HIGH (ref 70–99)
Glucose-Capillary: 79 mg/dL (ref 70–99)
Glucose-Capillary: 83 mg/dL (ref 70–99)
Glucose-Capillary: 93 mg/dL (ref 70–99)
Glucose-Capillary: 93 mg/dL (ref 70–99)

## 2023-11-21 NOTE — Progress Notes (Addendum)
 Daily Progress Note Intern Pager: [phone]  Patient name: Brianna Wheeler Medical record number: [id number] Date of birth: [birth year] Age: 68 y.o. Gender: female  Primary Care Provider: Kandis Ormond, DO Consultants: Heme/Onc, GI Code Status: Full  Pt Overview and Major Events to Date:  4/16 - Admitted 4/19 - Foley placed. 4/23 - Foley removed.  EGD and BMA completed; GI signed off.   Assessment and Plan: Brianna Wheeler is a 68 y.o. female with a pertinent PMH of T2DM, HTN, hypothyroidism who presented with encephalopathy and was admitted for sepsis 2/2 UTI, currently medically stable for discharge pending discharge to SNF.  LCSW faxing out to SNFs starting today, though APS must clear safe discharge plan prior to final decision given that patient would not be living with son who requires significant supportive care at home.  Assessment & Plan Cirrhosis of liver without ascites (HCC) Stable. - Continue lactulose  20 mg TID, titrate to 3-4 stools/day - Given amyloidosis stain negative, will hold off on liver biopsy until patient follows up with GI outpatient Gammopathy, monoclonal Heme following.  Will need outpatient f/u for treatment of myeloma. - Heme/onc following, appreciate recs - Will need outpatient follow up for treatment of suspected myeloma. Urinary retention Foley from 4/19 - 4/23.  Continues to void spontaneously since void trial.  - Continue flomax  0.4 mg daily - Strict I/Os Portal venous thrombus Found on this admission. Stable. Did not anticoagulate per GI and hematology recs. Patient to follow up with GI outpatient. Vitamin D  deficiency Vit D deficient. Both Vit D, 25 hydroxy and Vit D, 1, 25 Hydroxy - Will start patient on daily Vit D and have patient follow up Vit D 1,25 Hydroxy outpatient Vaginal bleeding Patient c/o of blood in urine. Pelvic exam completed this hospitalization showed blood clots in vaginal vault. TVUS showed abnormal endometrial  thickening 15 mm.  - Will need outpatient follow up and endometrial biopsy Anemia Macrocytic. Hg appears stable ~9.5 since admission.  Patient had 1-2 month history of "bloody urine" prior to admission.  TVUS during admission showed vaginal bleeding with thickened endometrium. Will need outpatient follow up. - EPO per Heme/Onc E. coli UTI (urinary tract infection) (Resolved: 11/21/2023) Resolved. Completed full dose of antibiotics (4/16 - 4/22) Hepatic encephalopathy (HCC) (Resolved: 11/21/2023) Resolved. At Baseline. - GI following, appreciate recs - continue 20 mg lactulose  BID - fall precautions Chronic health problem DMII: stable, continue CBG twice daily HTN: normotensive, holding home BP meds Hypothyroidism: continue home synthroid  HLD: restart home statin at discharge Pain: continue home tramadol  prn Vit D: continue to supplement, will need outpatient Vit D level checked  Anemia: Hg appears stable. Will need outpatient w/u of postmenopausal bleeding. Epo per heme/onc recs   FEN/GI: Regular diet PPx: SCDs Dispo: SNF pending placement.  Current plan is for son to stay at home with neighbors stopping by to check in periodically, LCSW working to identify if this is safe plan per APS.   Subjective:  This morning, patient feels well overall, though complains of some buttock discomfort due to wash cloths in hospital. Still planning to go to SNF, with son to go home with neighbors checking in on him, pending APS clearance.   Objective: Temp:  [97.8 F (36.6 C)-98.4 F (36.9 C)] 97.8 F (36.6 C) (05/01 0552) Pulse Rate:  [74-87] 74 (05/01 0552) Resp:  [18-20] 20 (05/01 0552) BP: (105-122)/(44-60) 116/47 (05/01 0552) SpO2:  [95 %-100 %] 97 % (05/01 0552)  Physical Exam:  General: Resting comfortably in bed, NAD, alert and at baseline. Cardiovascular: Regular rate and rhythm. Normal S1/S2. No murmurs, rubs, or gallops appreciated. 2+ radial pulses. Pulmonary: Clear bilaterally to  auscultation; no wheezes, crackles, or rhonchi. Normal WOB on room air, no accessory muscle usage. Abdominal: Normoactive bowel sounds, nondistended. No shifting fluid wave. No tenderness to deep or light palpation. No rebound or guarding. Mild staining from vaginal bleeding. Skin: Warm and dry.  Thin, papery skin. Extremities: No peripheral edema bilaterally. Capillary refill <2 seconds.   Laboratory: Most recent CBC Lab Results  Component Value Date   WBC 4.3 11/20/2023   HGB 9.3 (L) 11/20/2023   HCT 28.1 (L) 11/20/2023   MCV 109.8 (H) 11/20/2023   PLT 60 (L) 11/20/2023   Most recent BMP    Latest Ref Rng & Units 11/20/2023    4:58 AM  BMP  Glucose 70 - 99 mg/dL 85   BUN 8 - 23 mg/dL 10   Creatinine 6.57 - 1.00 mg/dL 8.46   Sodium 962 - 952 mmol/L 133   Potassium 3.5 - 5.1 mmol/L 4.1   Chloride 98 - 111 mmol/L 104   CO2 22 - 32 mmol/L 26   Calcium  8.9 - 10.3 mg/dL 7.9     Other pertinent labs: CBG (last 3)  Recent Labs    11/20/23 2359 11/21/23 0554 11/21/23 0750  GLUCAP 93 83 79    New Imaging/Diagnostic Tests: -None  Hesper Venturella, MD 11/21/2023, 7:16 AM  PGY-1, [facility] FPTS Intern pager: [phone], text pages welcome Secure chat group [facility] Teaching Service

## 2023-11-21 NOTE — Assessment & Plan Note (Addendum)
 Foley from 4/19 - 4/23.  Continues to void spontaneously since void trial.  - Continue flomax  0.4 mg daily - Strict I/Os

## 2023-11-21 NOTE — Assessment & Plan Note (Signed)
 Resolved. Completed full dose of antibiotics (4/16 - 4/22)

## 2023-11-21 NOTE — TOC Progression Note (Addendum)
 Transition of Care [facility]) - Progression Note    Patient Details  Name: Brianna Wheeler MRN: [id number] Date of Birth: 1956-01-28  [facility] CM/SW Contact  Carmon Christen, LCSWA Phone Number: 11/21/2023, 2:11 PM  Clinical Narrative:     Patients passr is approved [id number] A. APS social worker working on safe plan for patients son.Patient gave CSW permission to fax out initial referral for SNF placement. CSW faxed patient out for SNF placement.  CSW will continue to follow and assist with patients dc planning needs.  Update- CSW provided SNF bed offers to patient. Patient accepted SNF bed offer with [facility]. Tanya with [facility] confirmed SNF bed for patient.CSW following to start insurance authorization for patient once APS social worker confirms plan for patients son. CSW will continue to follow.  Expected Discharge Plan: Skilled Nursing Facility Barriers to Discharge: Continued Medical Work up  Expected Discharge Plan and Services In-house Referral: Clinical Social Work Discharge Planning Services: CM Consult   Living arrangements for the past 2 months: Single Family Home                   DME Agency: NA                   Social Determinants of Health (SDOH) Interventions SDOH Screenings   Food Insecurity: No Food Insecurity (11/07/2023)  Housing: Low Risk  (11/07/2023)  Transportation Needs: No Transportation Needs (11/07/2023)  Utilities: Not At Risk (11/07/2023)  Alcohol Screen: Low Risk  (09/30/2023)  Depression (PHQ2-9): Low Risk  (09/30/2023)  Financial Resource Strain: Low Risk  (09/30/2023)  Physical Activity: Insufficiently Active (09/30/2023)  Social Connections: Moderately Integrated (11/07/2023)  Stress: No Stress Concern Present (09/30/2023)  Tobacco Use: Low Risk  (11/13/2023)  Health Literacy: Adequate Health Literacy (09/30/2023)    Readmission Risk Interventions     No data to display

## 2023-11-21 NOTE — Assessment & Plan Note (Addendum)
 Stable. - Continue lactulose  20 mg TID, titrate to 3-4 stools/day - Given amyloidosis stain negative, will hold off on liver biopsy until patient follows up with GI outpatient

## 2023-11-21 NOTE — Assessment & Plan Note (Signed)
 DMII: stable, continue CBG twice daily HTN: normotensive, holding home BP meds Hypothyroidism: continue home synthroid  HLD: restart home statin at discharge Pain: continue home tramadol  prn Vit D: continue to supplement, will need outpatient Vit D level checked  Anemia: Hg appears stable. Will need outpatient w/u of postmenopausal bleeding. Epo per heme/onc recs

## 2023-11-21 NOTE — Plan of Care (Signed)
   Problem: Education: Goal: Knowledge of General Education information will improve Description: Including pain rating scale, medication(s)/side effects and non-pharmacologic comfort measures Outcome: Progressing   Problem: Activity: Goal: Risk for activity intolerance will decrease Outcome: Progressing   Problem: Pain Managment: Goal: General experience of comfort will improve and/or be controlled Outcome: Progressing

## 2023-11-21 NOTE — Assessment & Plan Note (Signed)
>>  ASSESSMENT AND PLAN FOR PORTAL VENOUS THROMBUS WRITTEN ON 11/21/2023  8:59 AM BY Homer Lust, MD  Found on this admission. Stable. Did not anticoagulate per GI and hematology recs. Patient to follow up with GI outpatient.

## 2023-11-21 NOTE — Assessment & Plan Note (Signed)
 Heme following.  Will need outpatient f/u for treatment of myeloma. - Heme/onc following, appreciate recs - Will need outpatient follow up for treatment of suspected myeloma.

## 2023-11-21 NOTE — Assessment & Plan Note (Signed)
 Found on this admission. Stable. Did not anticoagulate per GI and hematology recs. Patient to follow up with GI outpatient.

## 2023-11-21 NOTE — Assessment & Plan Note (Addendum)
 Resolved. At Baseline. - GI following, appreciate recs - continue 20 mg lactulose  BID - fall precautions

## 2023-11-21 NOTE — Assessment & Plan Note (Addendum)
 Patient c/o of blood in urine. Pelvic exam completed this hospitalization showed blood clots in vaginal vault. TVUS showed abnormal endometrial thickening 15 mm.  - Will need outpatient follow up and endometrial biopsy

## 2023-11-21 NOTE — Assessment & Plan Note (Addendum)
 Vit D deficient. Both Vit D, 25 hydroxy and Vit D, 1, 25 Hydroxy - Will start patient on daily Vit D and have patient follow up Vit D 1,25 Hydroxy outpatient

## 2023-11-21 NOTE — Assessment & Plan Note (Addendum)
 Macrocytic. Hg appears stable ~9.5 since admission.  Patient had 1-2 month history of "bloody urine" prior to admission.  TVUS during admission showed vaginal bleeding with thickened endometrium. Will need outpatient follow up. - EPO per Heme/Onc

## 2023-11-22 ENCOUNTER — Other Ambulatory Visit: Payer: Self-pay | Admitting: Family Medicine

## 2023-11-22 ENCOUNTER — Encounter (HOSPITAL_COMMUNITY): Payer: Self-pay | Admitting: Hematology & Oncology

## 2023-11-22 DIAGNOSIS — D696 Thrombocytopenia, unspecified: Secondary | ICD-10-CM | POA: Diagnosis not present

## 2023-11-22 DIAGNOSIS — N939 Abnormal uterine and vaginal bleeding, unspecified: Secondary | ICD-10-CM

## 2023-11-22 LAB — CBC WITH DIFFERENTIAL/PLATELET
Abs Immature Granulocytes: 0.01 10*3/uL (ref 0.00–0.07)
Basophils Absolute: 0 10*3/uL (ref 0.0–0.1)
Basophils Relative: 2 %
Eosinophils Absolute: 0.1 10*3/uL (ref 0.0–0.5)
Eosinophils Relative: 5 %
HCT: 31.3 % — ABNORMAL LOW (ref 36.0–46.0)
Hemoglobin: 10.1 g/dL — ABNORMAL LOW (ref 12.0–15.0)
Immature Granulocytes: 0 %
Lymphocytes Relative: 32 %
Lymphs Abs: 0.8 10*3/uL (ref 0.7–4.0)
MCH: 35.8 pg — ABNORMAL HIGH (ref 26.0–34.0)
MCHC: 32.3 g/dL (ref 30.0–36.0)
MCV: 111 fL — ABNORMAL HIGH (ref 80.0–100.0)
Monocytes Absolute: 0.3 10*3/uL (ref 0.1–1.0)
Monocytes Relative: 10 %
Neutro Abs: 1.3 10*3/uL — ABNORMAL LOW (ref 1.7–7.7)
Neutrophils Relative %: 51 %
Platelets: 44 10*3/uL — ABNORMAL LOW (ref 150–400)
RBC: 2.82 MIL/uL — ABNORMAL LOW (ref 3.87–5.11)
RDW: 19.3 % — ABNORMAL HIGH (ref 11.5–15.5)
Smear Review: DECREASED
WBC: 2.6 10*3/uL — ABNORMAL LOW (ref 4.0–10.5)
nRBC: 0 % (ref 0.0–0.2)

## 2023-11-22 LAB — COMPREHENSIVE METABOLIC PANEL WITH GFR
ALT: 28 U/L (ref 0–44)
AST: 56 U/L — ABNORMAL HIGH (ref 15–41)
Albumin: <1.5 g/dL — ABNORMAL LOW (ref 3.5–5.0)
Alkaline Phosphatase: 61 U/L (ref 38–126)
Anion gap: 6 (ref 5–15)
BUN: 9 mg/dL (ref 8–23)
CO2: 23 mmol/L (ref 22–32)
Calcium: 8 mg/dL — ABNORMAL LOW (ref 8.9–10.3)
Chloride: 105 mmol/L (ref 98–111)
Creatinine, Ser: 0.71 mg/dL (ref 0.44–1.00)
GFR, Estimated: >60 mL/min (ref >60)
Glucose, Bld: 91 mg/dL (ref 70–99)
Potassium: 4.2 mmol/L (ref 3.5–5.1)
Sodium: 134 mmol/L — ABNORMAL LOW (ref 135–145)
Total Bilirubin: 2.8 mg/dL — ABNORMAL HIGH (ref 0.0–1.2)
Total Protein: 5.7 g/dL — ABNORMAL LOW (ref 6.5–8.1)

## 2023-11-22 LAB — GLUCOSE, CAPILLARY
Glucose-Capillary: 88 mg/dL (ref 70–99)
Glucose-Capillary: 105 mg/dL — ABNORMAL HIGH (ref 70–99)
Glucose-Capillary: 101 mg/dL — ABNORMAL HIGH (ref 70–99)

## 2023-11-22 MED ORDER — MEDROXYPROGESTERONE ACETATE 10 MG PO TABS: 10.0000 mg | ORAL_TABLET | Freq: Every day | ORAL | Status: DC

## 2023-11-22 MED ORDER — HYDROCORTISONE 1 % EX CREA
TOPICAL_CREAM | Freq: Two times a day (BID) | CUTANEOUS | Status: DC | PRN
  Filled 2023-11-22: qty 28

## 2023-11-22 MED ORDER — MEDROXYPROGESTERONE ACETATE 150 MG/ML IM SUSP
150.0000 mg | Freq: Once | INTRAMUSCULAR | Status: AC
  Administered 2023-11-22: 150 mg via INTRAMUSCULAR
  Filled 2023-11-22: qty 1

## 2023-11-22 NOTE — Assessment & Plan Note (Deleted)
 Found on this admission. Stable. Did not anticoagulate per GI and hematology recs. Patient to follow up with GI outpatient.

## 2023-11-22 NOTE — Assessment & Plan Note (Deleted)
 Patient c/o of blood in urine. Pelvic exam completed this hospitalization showed blood clots in vaginal vault. TVUS showed abnormal endometrial thickening 15 mm.  - Will need outpatient follow up and endometrial biopsy

## 2023-11-22 NOTE — Progress Notes (Signed)
 Physical Therapy Treatment Patient Details Name: OTA RACER MRN: [id number] DOB: 06-10-1956 Today's Date: 11/22/2023   History of Present Illness Pt is a 68 y/o female presenting after a fall at home and AMS. Pt admitted for acute encephalopathy, sepsis and BLE/abdominal edema. LP 4/17. PMH: HTN, hypothyroidism, prediabetes, hyperlipidemia, osteoarthritis, GERD, asthma    PT Comments  Pt supine in bed on entry with complaints of severe back pain after night RN attempted to provide deep tissue manipulation to improve back pain, unfortunately efforts only exacerbated pain. Pt agreeable to try to stand to see if it relieved her back pain. Pt tolerated short bout of standing and lateral stepping along EoB until pain increased and pt request to get back to bed. Pt is min-modAx2 for bed mobility, min A x2 for coming to standing with help of Stedy and min-modAx2 for stepping up and down the side of the bed. D/c plans remain appropriate. Goals have been reviewed and updated. PT will continue to see acutely.     If plan is discharge home, recommend the following: Two people to help with walking and/or transfers;Two people to help with bathing/dressing/bathroom;Assistance with cooking/housework;Assistance with feeding;Direct supervision/assist for medications management;Direct supervision/assist for financial management;Assist for transportation;Help with stairs or ramp for entrance;Supervision due to cognitive status   Can travel by private vehicle     No  Equipment Recommendations  Hospital bed    Recommendations for Other Services       Precautions / Restrictions Precautions Precautions: Fall;Other (comment) Recall of Precautions/Restrictions: Impaired Precaution/Restrictions Comments: L eye visual deficits Restrictions Weight Bearing Restrictions Per Provider Order: No     Mobility  Bed Mobility Overal bed mobility: Needs Assistance Bed Mobility: Supine to Sit     Supine to sit: [name]  elevated, Min assist, Used rails Sit to supine: Mod assist, +2 for physical assistance   General bed mobility comments: Pt able to manage LE off bed and pull on bed rails to come to EoB, requires min A for pad scoot of hips to EoB, pt requires modAx2 to return LE to bed at end of session    Transfers Overall transfer level: Needs assistance Equipment used: Rolling walker (2 wheels), 2 person hand held assist Transfers: Sit to/from Stand Sit to Stand: +2 physical assistance, From elevated surface, Min assist          Lateral/Scoot Transfers: +2 physical assistance, +2 safety/equipment, Mod assist General transfer comment: utilizing bar of turned around [place], pt able to come to standing with  minAx2  pt able to take lateral steps with support from [facility] 4 feet to R and 4 feet L and again 4 feet L towards [name] .        Balance Overall balance assessment: Needs assistance Sitting-balance support: No upper extremity supported Sitting balance-[name]: Fair     Standing balance support: Bilateral upper extremity supported, Reliant on assistive device for balance Standing balance-[name]: Poor Standing balance comment: requires UE support on Stedy bar.                            Communication Communication Communication: No apparent difficulties  Cognition Arousal: Alert Behavior During Therapy: WFL for tasks assessed/performed   PT - Cognitive impairments: Safety/Judgement                         Following commands: Impaired Following commands impaired: Only follows one step commands consistently,  Follows one step commands with increased time    Cueing Cueing Techniques: Verbal cues, Tactile cues, Visual cues  Exercises      General Comments General comments (skin integrity, edema, etc.): VSS on RA      Pertinent Vitals/Pain Pain Assessment Pain Assessment: Faces Faces Pain Scale: Hurts even more Pain Location: back pain Pain Descriptors  / Indicators: Sore, Discomfort Pain Intervention(s): Limited activity within patient's tolerance, Monitored during session, Repositioned     PT Goals (current goals can now be found in the care plan section) Acute Rehab PT Goals PT Goal Formulation: With patient/family Time For Goal Achievement: 12/06/23 Potential to Achieve Goals: Fair Progress towards PT goals: Progressing toward goals    Frequency    Min 2X/week       AM-PAC PT "6 Clicks" Mobility   Outcome Measure  Help needed turning from your back to your side while in a flat bed without using bedrails?: A Little Help needed moving from lying on your back to sitting on the side of a flat bed without using bedrails?: A Lot Help needed moving to and from a bed to a chair (including a wheelchair)?: Total Help needed standing up from a chair using your arms (e.g., wheelchair or bedside chair)?: Total Help needed to walk in hospital room?: Total Help needed climbing 3-5 steps with a railing? : Total 6 Click Score: 9    End of Session Equipment Utilized During Treatment: Gait belt Activity Tolerance: Patient limited by pain Patient left: with family/visitor present;in bed;with bed alarm set Nurse Communication: Mobility status;Need for lift equipment PT Visit Diagnosis: Unsteadiness on feet (R26.81);Other abnormalities of gait and mobility (R26.89);Repeated falls (R29.6);Muscle weakness (generalized) (M62.81);History of falling (Z91.81);Difficulty in walking, not elsewhere classified (R26.2) Pain - Right/Left: Right Pain - part of body: Leg     Time: 1610-9604 PT Time Calculation (min) (ACUTE ONLY): 30 min  Charges:    $Gait Training: 8-22 mins $Therapeutic Activity: 8-22 mins PT General Charges $$ ACUTE PT VISIT: 1 Visit                     Wrenn Willcox B. [name] PT, DPT Acute Rehabilitation Services Please use secure chat or  Call Office [phone]    [name] [facility] 11/22/2023, 4:47 PM

## 2023-11-22 NOTE — Progress Notes (Signed)
 Apparently, she is still having some bleeding.  Is hard to say how much she has.  She apparently will be going to SNF.  Not sure what this will happen.  She really needs to have some lab work done today.  Last labs were done 2 days ago.  She really needs to have gynecologic examination because of the thickened endometrial lining.  I know this is very difficult to do with inpatients.  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  I am not sure she really qualifies for overt multiple myeloma.  However, on her FISH panel with her bone marrow, she does have an adverse cytogenetic-that being a t(11:14).  We will certainly get her into the office when she has an outpatient and get some treatment going.  I am still not convinced that the myeloma is the source of her pancytopenia.  I still think she has liver issues.  However, I am not sure that she will ever have a liver biopsy.  Of note, she did have Congo Red staining on the stomach biopsies.  These were negative for amyloid.  We will have to see what her labs look like today.  Again, she may go to skilled nursing.  I am not sure when this will happen.   Rayleen Cal, MD  [place] Cornea 1:5

## 2023-11-22 NOTE — Assessment & Plan Note (Deleted)
 Macrocytic. Hg appears stable ~9.5 since admission.  Patient had 1-2 month history of "bloody urine" prior to admission.  TVUS during admission showed vaginal bleeding with thickened endometrium. Will need outpatient follow up. - EPO per Heme/Onc

## 2023-11-22 NOTE — Plan of Care (Signed)
   Problem: Coping: Goal: Level of anxiety will decrease Outcome: Progressing

## 2023-11-22 NOTE — Assessment & Plan Note (Signed)
 DMII: stable, continue CBG twice daily HTN: normotensive, holding home BP meds Hypothyroidism: continue home synthroid  HLD: restart home statin at discharge Pain: continue home tramadol  prn Vit D deficiency: continue to supplement, will need outpatient Vit D level checked . Anemia: Hgb appears stable. Will need outpatient w/u of postmenopausal bleeding. EPO per heme/onc recs. Monoclonal gammopathy: Will need outpatient follow-up for treatment of myeloma. Vaginal bleeding: Will need outpatient follow-up and endometrial biopsy. Portal venous thrombus: Found on admission, stable.  Patient GI follow-up. Urinary retention: Foley placed from 4/19 - 4/23.  Now voiding spontaneously.  Continue Flomax  0.4 mg daily.

## 2023-11-22 NOTE — TOC Progression Note (Addendum)
 Transition of Care [facility]) - Progression Note    Patient Details  Name: Brianna Wheeler MRN: [id number] Date of Birth: Nov 27, 1955  [facility] CM/SW Contact  Carmon Christen, LCSWA Phone Number: 11/22/2023, 10:55 AM  Clinical Narrative:     CSW spoke with [facility] APS social worker this morning who informed CSW that she plans to come to see patient and patients son today. APS social worker plans to follow up with CSW afterwards on status of plan for patients son. Once plan confirmed for patients son, CSW plans to start insurance authorization for SNF placement for patient. CSW will continue to follow.  Update-  APS social worker Shonte met with patient and patients son at bedside. APS social worker plans to contact CSW once plan is confirmed and in place. Once plan confirmed and in place, Tonya with [facility] confirmed they can accept patient on Monday pending auth and if patient medically stable. Shonte APS social worker tele# [phone].  Update- CSW received call from [facility] with APS who informed CSW that patients plan for Son has been approved and that CSW can proceed with rehab placement for patient. [facility] can accept patient on Monday if medically stable and pending auth.    Expected Discharge Plan: Skilled Nursing Facility Barriers to Discharge: Continued Medical Work up  Expected Discharge Plan and Services In-house Referral: Clinical Social Work Discharge Planning Services: CM Consult   Living arrangements for the past 2 months: Single Family Home                   DME Agency: NA                   Social Determinants of Health (SDOH) Interventions SDOH Screenings   Food Insecurity: No Food Insecurity (11/07/2023)  Housing: Low Risk  (11/07/2023)  Transportation Needs: No Transportation Needs (11/07/2023)  Utilities: Not At Risk (11/07/2023)  Alcohol Screen: Low Risk  (09/30/2023)  Depression (PHQ2-9): Low Risk  (09/30/2023)  Financial Resource Strain:  Low Risk  (09/30/2023)  Physical Activity: Insufficiently Active (09/30/2023)  Social Connections: Moderately Integrated (11/07/2023)  Stress: No Stress Concern Present (09/30/2023)  Tobacco Use: Low Risk  (11/13/2023)  Health Literacy: Adequate Health Literacy (09/30/2023)    Readmission Risk Interventions     No data to display

## 2023-11-22 NOTE — Assessment & Plan Note (Addendum)
 Pelvic exam completed care during this hospitalization showed blood clots in vaginal vault. TVUS showed abnormal endometrial thickening 15 mm. Hgb stable this AM, 10.1. - May need outpatient follow up and endometrial biopsy, depending on GOC - Will give Depo Provera  today for symptomatic management of bleeding

## 2023-11-22 NOTE — Progress Notes (Addendum)
 Daily Progress Note Intern Pager: [phone]  Patient name: Brianna Wheeler Medical record number: [id number] Date of birth: 1956-02-12 Age: 68 y.o. Gender: female  Primary Care Provider: Kandis Ormond, DO Consultants: Heme/Onc, GI (s/o) Code Status: Full  Pt Overview and Major Events to Date:  4/16 - Admitted 4/19 - Foley placed. 4/23 - Foley removed. EGD and BMA completed; GI signed off.  Assessment and Plan: AVIANAH STAHR is a 68 y.o. female with a pertinent PMH of T2DM, HTN, hypothyroidism who presented with encephalopathy and was admitted for sepsis 2/2 UTI, currently medically stable for discharge pending discharge to SNF.  LCSW faxing out to SNFs starting today, though APS must clear safe discharge plan prior to final decision given that patient would not be living with son who requires significant supportive care at home.  Assessment & Plan Cirrhosis of liver without ascites (HCC) Stable.  Amyloidosis stain was negative - no indication for liver biopsy inpatient, patient may follow-up with GI outpatient to pursue this. - Continue lactulose  20 mg TID, titrate to 3-4 stools/day Vaginal bleeding Pelvic exam completed care during this hospitalization showed blood clots in vaginal vault. TVUS showed abnormal endometrial thickening 15 mm. Hgb stable this AM, 10.1. - May need outpatient follow up and endometrial biopsy, depending on GOC - Will give Depo Provera  today for symptomatic management of bleeding Chronic health problem DMII: stable, continue CBG twice daily HTN: normotensive, holding home BP meds Hypothyroidism: continue home synthroid  HLD: restart home statin at discharge Pain: continue home tramadol  prn Vit D deficiency: continue to supplement, will need outpatient Vit D level checked . Anemia: Hgb appears stable. Will need outpatient w/u of postmenopausal bleeding. EPO per heme/onc recs. Monoclonal gammopathy: Will need outpatient follow-up for treatment of  myeloma. Vaginal bleeding: Will need outpatient follow-up and endometrial biopsy. Portal venous thrombus: Found on admission, stable.  Patient GI follow-up. Urinary retention: Foley placed from 4/19 - 4/23.  Now voiding spontaneously.  Continue Flomax  0.4 mg daily.  FEN/GI: Regular diet PPx: SCDs Dispo:SNF  pending placement .   Subjective:  Patient seen this morning sleeping in bed, son sleeping in recliner at bedside.  She awakens easily to verbal stimuli.  Denies any concerns at this time.  Objective: Temp:  [97 F (36.1 C)-98.2 F (36.8 C)] 97.9 F (36.6 C) (05/02 0757) Pulse Rate:  [73-81] 74 (05/02 0757) Resp:  [18-22] 20 (05/02 0757) BP: (105-119)/(48-61) 105/51 (05/02 0757) SpO2:  [94 %-99 %] 99 % (05/02 0757) Physical Exam: General: Sleeping comfortably, awakens easily, NAD. Cardiovascular: RRR Respiratory: Normal work of breathing on room air Extremities: Moves all equally  Laboratory: Most recent CBC Lab Results  Component Value Date   WBC 2.6 (L) 11/22/2023   HGB 10.1 (L) 11/22/2023   HCT 31.3 (L) 11/22/2023   MCV 111.0 (H) 11/22/2023   PLT 44 (L) 11/22/2023   Most recent BMP    Latest Ref Rng & Units 11/22/2023   10:31 AM  BMP  Glucose 70 - 99 mg/dL 91   BUN 8 - 23 mg/dL 9   Creatinine 1.47 - 8.29 mg/dL 5.62   Sodium 130 - 865 mmol/L 134   Potassium 3.5 - 5.1 mmol/L 4.2   Chloride 98 - 111 mmol/L 105   CO2 22 - 32 mmol/L 23   Calcium  8.9 - 10.3 mg/dL 8.0     Omar Bibber, DO 11/22/2023, 12:52 PM  PGY-1, [facility] Family Medicine FPTS Intern pager: [phone], text pages welcome  Secure chat group [facility] Teaching Service

## 2023-11-22 NOTE — Assessment & Plan Note (Deleted)
 Foley from 4/19 - 4/23.  Continues to void spontaneously since void trial.  - Continue flomax  0.4 mg daily - Strict I/Os

## 2023-11-22 NOTE — Assessment & Plan Note (Deleted)
 Vit D deficient. Both Vit D, 25 hydroxy and Vit D, 1, 25 Hydroxy - Will start patient on daily Vit D and have patient follow up Vit D 1,25 Hydroxy outpatient

## 2023-11-22 NOTE — Assessment & Plan Note (Deleted)
 Heme following.  Will need outpatient f/u for treatment of myeloma. - Heme/onc following, appreciate recs - Will need outpatient follow up for treatment of suspected myeloma.

## 2023-11-22 NOTE — Assessment & Plan Note (Addendum)
 Stable.  Amyloidosis stain was negative - no indication for liver biopsy inpatient, patient may follow-up with GI outpatient to pursue this. - Continue lactulose  20 mg TID, titrate to 3-4 stools/day

## 2023-11-23 LAB — COMPREHENSIVE METABOLIC PANEL WITH GFR
ALT: 25 U/L (ref 0–44)
AST: 50 U/L — ABNORMAL HIGH (ref 15–41)
Albumin: <1.5 g/dL — ABNORMAL LOW (ref 3.5–5.0)
Alkaline Phosphatase: 62 U/L (ref 38–126)
Anion gap: 4 — ABNORMAL LOW (ref 5–15)
BUN: 10 mg/dL (ref 8–23)
CO2: 26 mmol/L (ref 22–32)
Calcium: 7.7 mg/dL — ABNORMAL LOW (ref 8.9–10.3)
Chloride: 103 mmol/L (ref 98–111)
Creatinine, Ser: 0.79 mg/dL (ref 0.44–1.00)
GFR, Estimated: >60 mL/min (ref >60)
Glucose, Bld: 91 mg/dL (ref 70–99)
Potassium: 3.7 mmol/L (ref 3.5–5.1)
Sodium: 133 mmol/L — ABNORMAL LOW (ref 135–145)
Total Bilirubin: 2.9 mg/dL — ABNORMAL HIGH (ref 0.0–1.2)
Total Protein: 5.5 g/dL — ABNORMAL LOW (ref 6.5–8.1)

## 2023-11-23 LAB — CBC WITH DIFFERENTIAL/PLATELET
Abs Immature Granulocytes: 0.01 10*3/uL (ref 0.00–0.07)
Basophils Absolute: 0 10*3/uL (ref 0.0–0.1)
Basophils Relative: 1 %
Eosinophils Absolute: 0.3 10*3/uL (ref 0.0–0.5)
Eosinophils Relative: 9 %
HCT: 29.7 % — ABNORMAL LOW (ref 36.0–46.0)
Hemoglobin: 9.7 g/dL — ABNORMAL LOW (ref 12.0–15.0)
Immature Granulocytes: 0 %
Lymphocytes Relative: 36 %
Lymphs Abs: 1 10*3/uL (ref 0.7–4.0)
MCH: 35.8 pg — ABNORMAL HIGH (ref 26.0–34.0)
MCHC: 32.7 g/dL (ref 30.0–36.0)
MCV: 109.6 fL — ABNORMAL HIGH (ref 80.0–100.0)
Monocytes Absolute: 0.4 10*3/uL (ref 0.1–1.0)
Monocytes Relative: 13 %
Neutro Abs: 1.2 10*3/uL — ABNORMAL LOW (ref 1.7–7.7)
Neutrophils Relative %: 41 %
Platelets: 64 10*3/uL — ABNORMAL LOW (ref 150–400)
RBC: 2.71 MIL/uL — ABNORMAL LOW (ref 3.87–5.11)
RDW: 19.2 % — ABNORMAL HIGH (ref 11.5–15.5)
WBC: 2.9 10*3/uL — ABNORMAL LOW (ref 4.0–10.5)
nRBC: 0 % (ref 0.0–0.2)

## 2023-11-23 LAB — GLUCOSE, CAPILLARY
Glucose-Capillary: 86 mg/dL (ref 70–99)
Glucose-Capillary: 108 mg/dL — ABNORMAL HIGH (ref 70–99)
Glucose-Capillary: 85 mg/dL (ref 70–99)

## 2023-11-23 MED ORDER — SIMETHICONE 80 MG PO CHEW
80.0000 mg | CHEWABLE_TABLET | Freq: Four times a day (QID) | ORAL | Status: DC | PRN
  Administered 2023-11-24 (×2): 80 mg via ORAL
  Filled 2023-11-23 (×2): qty 1

## 2023-11-23 NOTE — Progress Notes (Signed)
 Daily Progress Note Intern Pager: [phone]  Patient name: Brianna Wheeler Medical record number: [id number] Date of birth: 1955/11/03 Age: 68 y.o. Gender: female  Primary Care Provider: Kandis Ormond, DO Consultants: Oncology Code Status: Full  Pt Overview and Major Events to Date:  Brianna Wheeler is a 68 y.o. female with a pertinent PMH of T2DM, HTN, hypothyroidism who presented with encephalopathy and was admitted for sepsis 2/2 UTI, currently medically stable for discharge pending discharge to SNF. LCSW faxing out to SNFs starting today, though APS must clear safe discharge plan prior to final decision given that patient would not be living with son who requires significant supportive care at home.   4/16 - Admitted 4/19 - Foley placed. 4/23 - Foley removed. EGD and BMA completed; GI signed off.  Assessment and Plan:  Still medically stable for discharge on Monday to SNF likely to [place].  Will discuss with patient doing SRI if she would like and continue goals of care discussion. Assessment & Plan Cirrhosis of liver without ascites (HCC) Stable.  Amyloidosis stain was negative - no indication for liver biopsy inpatient, patient may follow-up with GI outpatient to pursue this. - Continue lactulose  20 mg TID, titrate to 3-4 stools/day Vaginal bleeding Pelvic exam completed care during this hospitalization showed blood clots in vaginal vault. TVUS showed abnormal endometrial thickening 15 mm. Hgb stable this AM, 10.1. - May need outpatient follow up and endometrial biopsy, depending on GOC - Received Depo Provera  long-acting injectable 11/22/2023 Chronic health problem DMII: stable, continue CBG twice daily HTN: normotensive, holding home BP meds Hypothyroidism: continue home synthroid  HLD: restart home statin at discharge Pain: continue home tramadol  prn Vit D deficiency: continue to supplement, will need outpatient Vit D level checked . Anemia: Hgb appears stable.  Will need outpatient w/u of postmenopausal bleeding. EPO per heme/onc recs. Monoclonal gammopathy: Will need outpatient follow-up for treatment of myeloma. Vaginal bleeding: Will need outpatient follow-up and endometrial biopsy. Portal venous thrombus: Found on admission, stable.  Patient GI follow-up. Urinary retention: Foley placed from 4/19 - 4/23.  Now voiding spontaneously.  Continue Flomax  0.4 mg daily. Portal venous thrombus Found on this admission. Stable. Did not anticoagulate per GI and hematology recs. Patient to follow up with GI outpatient. Gammopathy, monoclonal Heme following.  Will need outpatient f/u for treatment of myeloma. - Heme/onc following, appreciate recs - Will need outpatient follow up for treatment of suspected myeloma. Vitamin D  deficiency Vit D deficient. Both Vit D, 25 hydroxy and Vit D, 1, 25 Hydroxy - Continue vitamin D  1000 units daily Urinary retention Foley from 4/19 - 4/23.  Continues to void spontaneously since void trial.  - Continue flomax  0.4 mg daily - Strict I/Os Anemia Macrocytic. Hg appears stable ~9.5 since admission.  Patient had 1-2 month history of "bloody urine" prior to admission.  TVUS during admission showed vaginal bleeding with thickened endometrium. Will need outpatient follow up. - EPO per Heme/Onc   FEN/GI: Regular diet PPx: SCDs Dispo:SNF  likely Monday . Barriers include placement at SNF.   Subjective:  Patient reports that she is having some issues with feel like she is getting urinate and then she passes gas.  Reports that the nurses are aware of this.  Reports that her son has in the room but he left.  Reports understanding is that she is going to [place] on Monday and confirm that this is the plan.  Reports no major concerns at this time.  Discussed  that we gave her the Depo Provera  injection yesterday to try and help with her uterine bleeding.  Objective: Temp:  [97.9 F (36.6 C)-98.2 F (36.8 C)] 98.2 F (36.8 C)  (05/03 1153) Pulse Rate:  [72-83] 72 (05/03 1153) Resp:  [16-20] 20 (05/03 1153) BP: (98-119)/(47-52) 119/52 (05/03 1153) SpO2:  [95 %-100 %] 98 % (05/03 1153) Physical Exam: General: Laying in bed, calm cooperative, no acute distress Cardiovascular: Normal heart sounds. Respiratory: Normal effort at rest. Abdomen: Morbid obese.  No tenderness to palpation.  Bowel sounds present. Extremities: Not wearing SCDs.  Bilateral lower extremity edema to 2+ to knees.  Laboratory: Most recent CBC Lab Results  Component Value Date   WBC 2.9 (L) 11/23/2023   HGB 9.7 (L) 11/23/2023   HCT 29.7 (L) 11/23/2023   MCV 109.6 (H) 11/23/2023   PLT 64 (L) 11/23/2023   Most recent BMP    Latest Ref Rng & Units 11/23/2023    3:09 AM  BMP  Glucose 70 - 99 mg/dL 91   BUN 8 - 23 mg/dL 10   Creatinine 1.61 - 1.00 mg/dL 0.96   Sodium 045 - 409 mmol/L 133   Potassium 3.5 - 5.1 mmol/L 3.7   Chloride 98 - 111 mmol/L 103   CO2 22 - 32 mmol/L 26   Calcium  8.9 - 10.3 mg/dL 7.7      Verdell Given, MD 11/23/2023, 4:06 PM  PGY-1, [facility] Health Family Medicine FPTS Intern pager: [phone], text pages welcome Secure chat group [facility] Teaching Service

## 2023-11-23 NOTE — Assessment & Plan Note (Signed)
 Stable.  Amyloidosis stain was negative - no indication for liver biopsy inpatient, patient may follow-up with GI outpatient to pursue this. - Continue lactulose  20 mg TID, titrate to 3-4 stools/day

## 2023-11-23 NOTE — Plan of Care (Signed)
  Problem: Safety: Goal: Non-violent Restraint(s) Outcome: Adequate for Discharge   Problem: Education: Goal: Knowledge of General Education information will improve Description: Including pain rating scale, medication(s)/side effects and non-pharmacologic comfort measures Outcome: Adequate for Discharge   Problem: Health Behavior/Discharge Planning: Goal: Ability to manage health-related needs will improve Outcome: Adequate for Discharge   Problem: Clinical Measurements: Goal: Ability to maintain clinical measurements within normal limits will improve Outcome: Adequate for Discharge Goal: Will remain free from infection Outcome: Adequate for Discharge Goal: Diagnostic test results will improve Outcome: Adequate for Discharge Goal: Respiratory complications will improve Outcome: Adequate for Discharge Goal: Cardiovascular complication will be avoided Outcome: Adequate for Discharge   Problem: Activity: Goal: Risk for activity intolerance will decrease Outcome: Adequate for Discharge   Problem: Nutrition: Goal: Adequate nutrition will be maintained Outcome: Adequate for Discharge   Problem: Coping: Goal: Level of anxiety will decrease Outcome: Adequate for Discharge   Problem: Elimination: Goal: Will not experience complications related to bowel motility Outcome: Adequate for Discharge Goal: Will not experience complications related to urinary retention Outcome: Adequate for Discharge   Problem: Pain Managment: Goal: General experience of comfort will improve and/or be controlled Outcome: Adequate for Discharge   Problem: Safety: Goal: Ability to remain free from injury will improve Outcome: Adequate for Discharge   Problem: Skin Integrity: Goal: Risk for impaired skin integrity will decrease Outcome: Adequate for Discharge

## 2023-11-23 NOTE — Assessment & Plan Note (Signed)
 Foley from 4/19 - 4/23.  Continues to void spontaneously since void trial.  - Continue flomax  0.4 mg daily - Strict I/Os

## 2023-11-23 NOTE — Assessment & Plan Note (Signed)
 Heme following.  Will need outpatient f/u for treatment of myeloma. - Heme/onc following, appreciate recs - Will need outpatient follow up for treatment of suspected myeloma.

## 2023-11-23 NOTE — Assessment & Plan Note (Signed)
 Vit D deficient. Both Vit D, 25 hydroxy and Vit D, 1, 25 Hydroxy - Continue vitamin D  1000 units daily

## 2023-11-23 NOTE — Assessment & Plan Note (Signed)
 Macrocytic. Hg appears stable ~9.5 since admission.  Patient had 1-2 month history of "bloody urine" prior to admission.  TVUS during admission showed vaginal bleeding with thickened endometrium. Will need outpatient follow up. - EPO per Heme/Onc

## 2023-11-23 NOTE — TOC Progression Note (Signed)
 Transition of Care [facility]) - Progression Note    Patient Details  Name: Brianna Wheeler MRN: [id number] Date of Birth: [birth year]  Transition of Care [facility]) CM/SW Contact  [address] Brianna Wheeler, [place] Phone Number: 11/23/2023, 10:20 AM  Clinical Narrative:     Authorization started with [employer] for SNF stay at [facility] ref# [id number]. Clinicals faxed to [phone].  TOC to continue to follow for discharge needs.  Carita Sollars, LCSW Transition of Care    Expected Discharge Plan: Skilled Nursing Facility Barriers to Discharge: Continued Medical Work up  Expected Discharge Plan and Services In-house Referral: Clinical Social Work Discharge Planning Services: CM Consult   Living arrangements for the past 2 months: Single Family Home                   DME Agency: NA                   Social Determinants of Health (SDOH) Interventions SDOH Screenings   Food Insecurity: No Food Insecurity (11/07/2023)  Housing: Low Risk  (11/07/2023)  Transportation Needs: No Transportation Needs (11/07/2023)  Utilities: Not At Risk (11/07/2023)  Alcohol Screen: Low Risk  (09/30/2023)  Depression (PHQ2-9): Low Risk  (09/30/2023)  Financial Resource Strain: Low Risk  (09/30/2023)  Physical Activity: Insufficiently Active (09/30/2023)  Social Connections: Moderately Integrated (11/07/2023)  Stress: No Stress Concern Present (09/30/2023)  Tobacco Use: Low Risk  (11/13/2023)  Health Literacy: Adequate Health Literacy (09/30/2023)    Readmission Risk Interventions     No data to display

## 2023-11-23 NOTE — Assessment & Plan Note (Signed)
 Found on this admission. Stable. Did not anticoagulate per GI and hematology recs. Patient to follow up with GI outpatient.

## 2023-11-23 NOTE — Assessment & Plan Note (Signed)
 Pelvic exam completed care during this hospitalization showed blood clots in vaginal vault. TVUS showed abnormal endometrial thickening 15 mm. Hgb stable this AM, 10.1. - May need outpatient follow up and endometrial biopsy, depending on GOC - Received Depo Provera  long-acting injectable 11/22/2023

## 2023-11-23 NOTE — Assessment & Plan Note (Signed)
 DMII: stable, continue CBG twice daily HTN: normotensive, holding home BP meds Hypothyroidism: continue home synthroid  HLD: restart home statin at discharge Pain: continue home tramadol  prn Vit D deficiency: continue to supplement, will need outpatient Vit D level checked . Anemia: Hgb appears stable. Will need outpatient w/u of postmenopausal bleeding. EPO per heme/onc recs. Monoclonal gammopathy: Will need outpatient follow-up for treatment of myeloma. Vaginal bleeding: Will need outpatient follow-up and endometrial biopsy. Portal venous thrombus: Found on admission, stable.  Patient GI follow-up. Urinary retention: Foley placed from 4/19 - 4/23.  Now voiding spontaneously.  Continue Flomax  0.4 mg daily.

## 2023-11-23 NOTE — Assessment & Plan Note (Signed)
>>  ASSESSMENT AND PLAN FOR PORTAL VENOUS THROMBUS WRITTEN ON 11/23/2023  4:06 PM BY Verdell Given, MD  Found on this admission. Stable. Did not anticoagulate per GI and hematology recs. Patient to follow up with GI outpatient.

## 2023-11-24 LAB — GLUCOSE, CAPILLARY
Glucose-Capillary: 108 mg/dL — ABNORMAL HIGH (ref 70–99)
Glucose-Capillary: 84 mg/dL (ref 70–99)
Glucose-Capillary: 88 mg/dL (ref 70–99)
Glucose-Capillary: 92 mg/dL (ref 70–99)

## 2023-11-24 MED ORDER — TRAMADOL HCL 50 MG PO TABS
50.0000 mg | ORAL_TABLET | Freq: Three times a day (TID) | ORAL | Status: DC | PRN
  Administered 2023-11-24 – 2023-11-25 (×2): 50 mg via ORAL
  Filled 2023-11-24 (×2): qty 1

## 2023-11-24 NOTE — Assessment & Plan Note (Addendum)
 Macrocytic, likely secondary to myeloma and chronic vaginal bleeding. - EPO per Heme/Onc - Trend Hgb

## 2023-11-24 NOTE — Progress Notes (Signed)
 Daily Progress Note Intern Pager: [phone]  Patient name: Brianna Wheeler Medical record number: [id number] Date of birth: 10/29/1955 Age: 68 y.o. Gender: female  Primary Care Provider: Kandis Ormond, DO Consultants: Oncology, GI, IR Code Status: Full  Pt Overview and Major Events to Date:  4/16 - Admitted 4/19 - Foley placed. 4/23 - Foley removed. EGD and BMA completed; GI signed off.  Assessment and Plan:  Brianna Wheeler is a 68 y.o. female with a pertinent PMH of T2DM, HTN, hypothyroidism who presented with AMS likely secondary to sepsis and hepatic encephalopathy, now both resolved. Additionally, throughout medical work-up patient was found to have new evidence of liver cirrhosis and new diagnosis of myeloma. Admission further complicated by new vaginal bleeding, but has remained hemodynamically stable.  Patient is currently medically stable for discharge. Assessment & Plan Cirrhosis of liver without ascites (HCC)  Thrombocytopenia Stable, extensive work-up negative. Likely secondary to metabolic liver disease. GI signed off, no indications for liver biopsy. - Continue lactulose  20 mg TID, titrate to 3-4 stools/day Vaginal bleeding Stable, Hgb this am pending. TVUS showed abnormal endometrial thickening 15 mm. - Outpatient follow up with Gyn and endometrial biopsy, depending on GOC - Received Depo Provera  long-acting injectable 11/22/2023 Smoldering myeloma Heme following.  Will need outpatient f/u for treatment of myeloma. - Heme/onc following, appreciate recs - Will need outpatient follow up for treatment of suspected myeloma. Vitamin D  deficiency Vit D deficient. Both Vit D, 25 hydroxy and Vit D, 1, 25 Hydroxy - Continue vitamin D  1000 units daily Anemia Macrocytic, likely secondary to myeloma and chronic vaginal bleeding. - EPO per Heme/Onc - Trend Hgb Chronic health problem DMII: stable, continue CBG twice daily HTN: normotensive, holding home BP  meds Hypothyroidism: continue home synthroid  HLD: restart home statin at discharge Pain: continue home tramadol  prn Vit D deficiency: continue to supplement, will need outpatient Vit D level checked . Portal venous thrombus: Found on admission, stable.  Patient GI follow-up. Urinary retention: Foley placed from 4/19 - 4/23.  Now voiding spontaneously.  Continue Flomax  0.4 mg daily.    FEN/GI: Regular diet PPx: SCDs given low platelets Dispo:SNF in 2-3 days. Medically stable  Subjective:  No acute events overnight, no further vaginal bleeding.  Patient has no complaints this a.m.  Objective: Temp:  [97.9 F (36.6 C)-98.3 F (36.8 C)] 98.3 F (36.8 C) (05/04 0023) Pulse Rate:  [72-78] 78 (05/03 1951) Resp:  [16-20] 20 (05/04 0023) BP: (101-119)/(47-71) 103/54 (05/04 0023) SpO2:  [96 %-98 %] 97 % (05/03 1951) Physical Exam: General: NAD, lying comfortably in hospital bed Neuro: A&O Cardiovascular: RRR, no murmurs Respiratory: normal WOB on room air, CTAB, no wheezes, ronchi or rales Abdomen: soft, NTTP, no rebound or guarding Extremities: Moving all 4 extremities equally, SCDs on    Laboratory: Most recent CBC Lab Results  Component Value Date   WBC 2.9 (L) 11/23/2023   HGB 9.7 (L) 11/23/2023   HCT 29.7 (L) 11/23/2023   MCV 109.6 (H) 11/23/2023   PLT 64 (L) 11/23/2023   Most recent BMP    Latest Ref Rng & Units 11/23/2023    3:09 AM  BMP  Glucose 70 - 99 mg/dL 91   BUN 8 - 23 mg/dL 10   Creatinine 5.78 - 1.00 mg/dL 4.69   Sodium 629 - 528 mmol/L 133   Potassium 3.5 - 5.1 mmol/L 3.7   Chloride 98 - 111 mmol/L 103   CO2 22 - 32 mmol/L 26  Calcium  8.9 - 10.3 mg/dL 7.7       Imaging/Diagnostic Tests: No new imaging.  Ivin Marrow, MD 11/24/2023, 12:52 AM  PGY-2, [facility] Family Medicine FPTS Intern pager: [phone], text pages welcome Secure chat group [facility] Teaching Service

## 2023-11-24 NOTE — Assessment & Plan Note (Addendum)
 Stable, Hgb this am pending. TVUS showed abnormal endometrial thickening 15 mm. - Outpatient follow up with Gyn and endometrial biopsy, depending on GOC - Received Depo Provera  long-acting injectable 11/22/2023

## 2023-11-24 NOTE — Assessment & Plan Note (Addendum)
 DMII: stable, continue CBG twice daily HTN: normotensive, holding home BP meds Hypothyroidism: continue home synthroid  HLD: restart home statin at discharge Pain: continue home tramadol  prn Vit D deficiency: continue to supplement, will need outpatient Vit D level checked . Portal venous thrombus: Found on admission, stable.  Patient GI follow-up. Urinary retention: Foley placed from 4/19 - 4/23.  Now voiding spontaneously.  Continue Flomax  0.4 mg daily.

## 2023-11-24 NOTE — Assessment & Plan Note (Deleted)
 Found on this admission. Stable. Did not anticoagulate per GI and hematology recs. Patient to follow up with GI outpatient.

## 2023-11-24 NOTE — Assessment & Plan Note (Addendum)
 Stable, extensive work-up negative. Likely secondary to metabolic liver disease. GI signed off, no indications for liver biopsy. - Continue lactulose  20 mg TID, titrate to 3-4 stools/day

## 2023-11-24 NOTE — Assessment & Plan Note (Addendum)
 Heme following.  Will need outpatient f/u for treatment of myeloma. - Heme/onc following, appreciate recs - Will need outpatient follow up for treatment of suspected myeloma.

## 2023-11-24 NOTE — Assessment & Plan Note (Deleted)
 Foley from 4/19 - 4/23.  Continues to void spontaneously since void trial.  - Continue flomax  0.4 mg daily - Strict I/Os

## 2023-11-24 NOTE — Assessment & Plan Note (Addendum)
 Vit D deficient. Both Vit D, 25 hydroxy and Vit D, 1, 25 Hydroxy - Continue vitamin D  1000 units daily

## 2023-11-25 DIAGNOSIS — J4540 Moderate persistent asthma, uncomplicated: Secondary | ICD-10-CM | POA: Diagnosis present

## 2023-11-25 DIAGNOSIS — E66813 Obesity, class 3: Secondary | ICD-10-CM | POA: Diagnosis present

## 2023-11-25 DIAGNOSIS — K219 Gastro-esophageal reflux disease without esophagitis: Secondary | ICD-10-CM | POA: Diagnosis not present

## 2023-11-25 DIAGNOSIS — H268 Other specified cataract: Secondary | ICD-10-CM | POA: Diagnosis not present

## 2023-11-25 DIAGNOSIS — I10 Essential (primary) hypertension: Secondary | ICD-10-CM | POA: Diagnosis present

## 2023-11-25 DIAGNOSIS — R4182 Altered mental status, unspecified: Secondary | ICD-10-CM | POA: Diagnosis not present

## 2023-11-25 DIAGNOSIS — E785 Hyperlipidemia, unspecified: Secondary | ICD-10-CM | POA: Diagnosis not present

## 2023-11-25 DIAGNOSIS — I82890 Acute embolism and thrombosis of other specified veins: Secondary | ICD-10-CM | POA: Diagnosis not present

## 2023-11-25 DIAGNOSIS — Z66 Do not resuscitate: Secondary | ICD-10-CM | POA: Diagnosis present

## 2023-11-25 DIAGNOSIS — K7581 Nonalcoholic steatohepatitis (NASH): Secondary | ICD-10-CM | POA: Diagnosis present

## 2023-11-25 DIAGNOSIS — E039 Hypothyroidism, unspecified: Secondary | ICD-10-CM | POA: Diagnosis not present

## 2023-11-25 DIAGNOSIS — R6889 Other general symptoms and signs: Secondary | ICD-10-CM | POA: Diagnosis not present

## 2023-11-25 DIAGNOSIS — Z6841 Body Mass Index (BMI) 40.0 and over, adult: Secondary | ICD-10-CM | POA: Diagnosis not present

## 2023-11-25 DIAGNOSIS — K8020 Calculus of gallbladder without cholecystitis without obstruction: Secondary | ICD-10-CM | POA: Diagnosis not present

## 2023-11-25 DIAGNOSIS — Z79899 Other long term (current) drug therapy: Secondary | ICD-10-CM | POA: Diagnosis not present

## 2023-11-25 DIAGNOSIS — Z7189 Other specified counseling: Secondary | ICD-10-CM | POA: Diagnosis not present

## 2023-11-25 DIAGNOSIS — R000 Tachycardia, unspecified: Secondary | ICD-10-CM | POA: Diagnosis not present

## 2023-11-25 DIAGNOSIS — R296 Repeated falls: Secondary | ICD-10-CM | POA: Diagnosis not present

## 2023-11-25 DIAGNOSIS — R1312 Dysphagia, oropharyngeal phase: Secondary | ICD-10-CM | POA: Diagnosis not present

## 2023-11-25 DIAGNOSIS — Z7401 Bed confinement status: Secondary | ICD-10-CM | POA: Diagnosis not present

## 2023-11-25 DIAGNOSIS — D721 Eosinophilia, unspecified: Secondary | ICD-10-CM | POA: Diagnosis not present

## 2023-11-25 DIAGNOSIS — K7460 Unspecified cirrhosis of liver: Secondary | ICD-10-CM | POA: Diagnosis not present

## 2023-11-25 DIAGNOSIS — M549 Dorsalgia, unspecified: Secondary | ICD-10-CM | POA: Diagnosis not present

## 2023-11-25 DIAGNOSIS — K766 Portal hypertension: Secondary | ICD-10-CM | POA: Diagnosis not present

## 2023-11-25 DIAGNOSIS — R600 Localized edema: Secondary | ICD-10-CM | POA: Diagnosis not present

## 2023-11-25 DIAGNOSIS — R601 Generalized edema: Secondary | ICD-10-CM | POA: Diagnosis not present

## 2023-11-25 DIAGNOSIS — M1990 Unspecified osteoarthritis, unspecified site: Secondary | ICD-10-CM | POA: Diagnosis not present

## 2023-11-25 DIAGNOSIS — J309 Allergic rhinitis, unspecified: Secondary | ICD-10-CM | POA: Diagnosis not present

## 2023-11-25 DIAGNOSIS — K7682 Hepatic encephalopathy: Secondary | ICD-10-CM | POA: Diagnosis present

## 2023-11-25 DIAGNOSIS — I959 Hypotension, unspecified: Secondary | ICD-10-CM | POA: Diagnosis not present

## 2023-11-25 DIAGNOSIS — E119 Type 2 diabetes mellitus without complications: Secondary | ICD-10-CM | POA: Diagnosis present

## 2023-11-25 DIAGNOSIS — N179 Acute kidney failure, unspecified: Secondary | ICD-10-CM | POA: Diagnosis present

## 2023-11-25 DIAGNOSIS — Z881 Allergy status to other antibiotic agents status: Secondary | ICD-10-CM | POA: Diagnosis not present

## 2023-11-25 DIAGNOSIS — E876 Hypokalemia: Secondary | ICD-10-CM | POA: Diagnosis not present

## 2023-11-25 DIAGNOSIS — R410 Disorientation, unspecified: Secondary | ICD-10-CM | POA: Diagnosis not present

## 2023-11-25 DIAGNOSIS — D61818 Other pancytopenia: Secondary | ICD-10-CM | POA: Diagnosis present

## 2023-11-25 DIAGNOSIS — H3320 Serous retinal detachment, unspecified eye: Secondary | ICD-10-CM | POA: Diagnosis not present

## 2023-11-25 DIAGNOSIS — G9341 Metabolic encephalopathy: Secondary | ICD-10-CM | POA: Diagnosis present

## 2023-11-25 DIAGNOSIS — J45909 Unspecified asthma, uncomplicated: Secondary | ICD-10-CM | POA: Diagnosis not present

## 2023-11-25 DIAGNOSIS — A419 Sepsis, unspecified organism: Secondary | ICD-10-CM | POA: Diagnosis not present

## 2023-11-25 DIAGNOSIS — R531 Weakness: Secondary | ICD-10-CM | POA: Diagnosis not present

## 2023-11-25 DIAGNOSIS — N939 Abnormal uterine and vaginal bleeding, unspecified: Secondary | ICD-10-CM | POA: Diagnosis not present

## 2023-11-25 DIAGNOSIS — R188 Other ascites: Secondary | ICD-10-CM | POA: Diagnosis present

## 2023-11-25 DIAGNOSIS — D696 Thrombocytopenia, unspecified: Secondary | ICD-10-CM | POA: Diagnosis not present

## 2023-11-25 DIAGNOSIS — Z743 Need for continuous supervision: Secondary | ICD-10-CM | POA: Diagnosis not present

## 2023-11-25 DIAGNOSIS — D472 Monoclonal gammopathy: Secondary | ICD-10-CM | POA: Diagnosis not present

## 2023-11-25 DIAGNOSIS — M545 Low back pain, unspecified: Secondary | ICD-10-CM | POA: Diagnosis not present

## 2023-11-25 DIAGNOSIS — I700 Atherosclerosis of aorta: Secondary | ICD-10-CM | POA: Diagnosis not present

## 2023-11-25 DIAGNOSIS — R110 Nausea: Secondary | ICD-10-CM | POA: Diagnosis not present

## 2023-11-25 DIAGNOSIS — R5381 Other malaise: Secondary | ICD-10-CM | POA: Diagnosis not present

## 2023-11-25 DIAGNOSIS — E7220 Disorder of urea cycle metabolism, unspecified: Secondary | ICD-10-CM | POA: Diagnosis not present

## 2023-11-25 DIAGNOSIS — R2681 Unsteadiness on feet: Secondary | ICD-10-CM | POA: Diagnosis not present

## 2023-11-25 DIAGNOSIS — Z885 Allergy status to narcotic agent status: Secondary | ICD-10-CM | POA: Diagnosis not present

## 2023-11-25 DIAGNOSIS — Z751 Person awaiting admission to adequate facility elsewhere: Secondary | ICD-10-CM | POA: Diagnosis not present

## 2023-11-25 DIAGNOSIS — Z515 Encounter for palliative care: Secondary | ICD-10-CM | POA: Diagnosis not present

## 2023-11-25 DIAGNOSIS — I81 Portal vein thrombosis: Secondary | ICD-10-CM | POA: Diagnosis not present

## 2023-11-25 DIAGNOSIS — R161 Splenomegaly, not elsewhere classified: Secondary | ICD-10-CM | POA: Diagnosis not present

## 2023-11-25 DIAGNOSIS — R2689 Other abnormalities of gait and mobility: Secondary | ICD-10-CM | POA: Diagnosis not present

## 2023-11-25 DIAGNOSIS — I872 Venous insufficiency (chronic) (peripheral): Secondary | ICD-10-CM | POA: Diagnosis not present

## 2023-11-25 DIAGNOSIS — M6281 Muscle weakness (generalized): Secondary | ICD-10-CM | POA: Diagnosis not present

## 2023-11-25 LAB — GLUCOSE, CAPILLARY: Glucose-Capillary: 75 mg/dL (ref 70–99)

## 2023-11-25 MED ORDER — FOLIC ACID 1 MG PO TABS: 2.0000 mg | ORAL_TABLET | Freq: Every day | ORAL | Status: DC

## 2023-11-25 MED ORDER — ACETAMINOPHEN 325 MG PO TABS: 650.0000 mg | ORAL_TABLET | Freq: Three times a day (TID) | ORAL | Status: DC | PRN

## 2023-11-25 MED ORDER — TAMSULOSIN HCL 0.4 MG PO CAPS: 0.4000 mg | ORAL_CAPSULE | Freq: Every day | ORAL | Status: DC

## 2023-11-25 MED ORDER — TRAMADOL HCL 50 MG PO TABS
50.0000 mg | ORAL_TABLET | Freq: Three times a day (TID) | ORAL | 0 refills | Status: DC | PRN
Start: 2023-11-25 — End: 2023-12-30

## 2023-11-25 MED ORDER — LACTULOSE 10 GM/15ML PO SOLN: 20.0000 g | Freq: Three times a day (TID) | ORAL | Status: DC

## 2023-11-25 MED ORDER — GERHARDT'S BUTT CREAM: 1.0000 | TOPICAL_CREAM | Freq: Every day | CUTANEOUS | Status: DC

## 2023-11-25 MED ORDER — VITAMIN D3 25 MCG PO TABS: 1000.0000 [IU] | ORAL_TABLET | Freq: Every day | ORAL | Status: DC

## 2023-11-25 NOTE — Assessment & Plan Note (Addendum)
 Chronic bleeding s/p TVUS showed abnormal endometrial thickening 15 mm. - Outpatient follow up with Gyn and endometrial biopsy, depending on GOC - Received Depo Provera  long-acting injectable 11/22/2023

## 2023-11-25 NOTE — Assessment & Plan Note (Addendum)
 Macrocytic, likely secondary to myeloma and chronic vaginal bleeding. - EPO per Heme/Onc - Vaginal bleeding/AUB workup as above

## 2023-11-25 NOTE — Assessment & Plan Note (Addendum)
 Bone marrow biopsy/aspiration suggestive of smoldering myeloma.  - Heme/onc following, appreciate recs - Will need outpatient follow up for treatment of smoldering myeloma.

## 2023-11-25 NOTE — Discharge Summary (Addendum)
 Family Medicine Teaching [facility] Discharge Summary  Patient name: Brianna Wheeler Medical record number: [id number] Date of birth: [birth year] Age: 68 y.o. Gender: female Date of Admission: 11/06/2023  Date of Discharge: 11/25/23  Admitting Physician: Charmel Cooter, MD  Primary Care Provider: Kandis Ormond, DO Consultants: Heme/Onc, GI, IR  Indication for Hospitalization: AMS  Discharge Diagnoses/Problem List:  Principal Problem for Admission: AMS, Hepatic encephalopathy, Smoldering myeloma,  Other Problems addressed during stay:  Principal Problem:   Cirrhosis of liver without ascites (HCC)  Thrombocytopenia Active Problems:   Vaginal bleeding   Smoldering myeloma   Possible Portal vein/Splenic vein thrombosis on CT   Hypothyroidism   Morbid obesity (HCC)   Chronic health problem   Anasarca   Anemia   Brief Hospital Course:  Brianna Wheeler is a 68 y.o.female with a history of T2DM, HTN, hypothyroidism, HLD who was admitted to the [facility] Medicine Teaching Service at [facility] for AMS. Her hospital course is detailed below:  AMS Presented via EMS for 1 day of altered mental status.  Per son patient went to the bathroom and fell and was moaning and moving around but did not answer any questions.   - In the ED patient was noted to be jaundiced, altered, febrile. Labs significant for lactic acidosis of 4.3, leukocytosis, elevated ammonia and CK.  UA with evidence of infection. Chest x-ray and CT head without acute findings. Patient was started on broad-spectrum abx and given IV fluids. EEG concerning for diffuse encephalopathy likely related to toxic metabolic cause.  IR was consulted for LP and performed on 4/17.  Meningitis/encephalitis panel negative. Patient was treated with lactulose .  Mental status greatly improved with lactulose  and antibiotic treatment. Patient returned to baseline mentation by time of discharge.   Portal Vein Thrombus Elevated  Bilirubin Transaminitis New NASH Cirrhosis  CT A/P showed likely splenic and or portal vein thrombus. Right upper quadrant ultrasound with Doppler ordered which showed portal vein thrombus which was nearly occlusive with slow flow detected did with low portal vein velocities along with suspected underlying cirrhosis.  GI was consulted who suspect new diagnosis of cirrhosis with decompensation without known etiology at this time.  Patient was treated with lactulose , which improved her mental status.  Given current issues with new finding of MGUS (as below), hematology did not recommend anticoagulation and GI recommended not to anticoagulate for Portal Vein Thrombus. EGD without varices but did show gastritis. GI signed off on 4/23.    Abnormal Labs MGUS Initial workup demonstrated: thrombocytopenia, liver dysfunction, elevated PTT. Labs concerning for  DIC, though smear did not show any abnormal morphology of red blood cells or platelets.  Per hematology, this was likely related to liver dysfunction and not DIC. Heme recommended to hold on any anticoagulation. Patient underwent bone marrow aspiration on 4/23 with findings consistent with smoldering myeloma, not qualified as overt  myeloma per Heme/Onc. Patient to follow up with Heme/Onc and possible outpatient treatment.   Abnormal uterine bleeding Patient reported chronic vaginal bleeding for ~1 month prior to admission. Pelvic ultrasound showed thickened endometrial stripe concerning for neoplasm. Unable to get endometrial biopsy while inpatient. Patient was very distressed about the bleeding. We temporized by giving depo medrol  shot to ameliorate bleeding. She will still need endometrial biopsy as soon as she can be seen outpatient, either  at gynecology consult or at [facility]. At [facility] we could do biopsy immediately on her discharge from SNF. Possibly consider Mirena IUD at  time of biopsy pending further evaluation  of her medical  stability as she will not be ideal candidate for hysterectomy in next few months (in setting of myeloma, cirrhosis, anasarca, obesity and general debilitation/weakness).  Urinary retention Patient had foley placed on 4/19 due to AMS and this was removed on 4/23 after improvement in mobility. Patient was started on Flomax . Patient demonstrated ability to void spontaneously on 4/26 and did not have any further episodes of urinary retention.  Safe Disposition PT/OT recommended SNF but patient initially declined refused due to having to care for her adult son with developmental delay and seizures. APS was involved due to concern of it being unsafe for her and her son to live alone without assistance. Ultimately, it was decided that patient would discharge to SNF and son would stay at home with neighbor checking in.  APS cleared this plan, and patient was medically stable for discharge to SNF.    Other chronic conditions were medically managed with home medications and formulary alternatives as necessary  PCP Follow-up Recommendations: Endometrial biopsy and GYN appointment for further evaluation and treatment of post menopausal vaginal bleeding with abnormal PUS, Needs to happen either at GYN office or can be done at [facility] clinic. Contact Dr. Andree Kayser at [facility] for scheduling if needed, and a referral to GYN has also been placed.. This needs to happen ASAP when she is discharging from [facility] hematology outpatient for smoldering myeloma Patient will need to be set up with GI for new NASH cirrhosis diagnosis. Follow up on 1,25 Vit D level in 8 weeks. Held BP meds ISO normotensive BP, consider adjusting versus restarting. Blood glucose normal during admission, consider DM regimen adjustments as needed. Consider repeating CBC and CMP outpatient for follow up of history of hx of hypokalemia and liver enzymes.  Consider repeat CBC for thrombocytopenia. Patient is open to follow up with palliative  care. Please coordinate outpatient palliative care consult.  Disposition: SNF  Discharge Condition: Improved, stable   Discharge Exam:  Vitals:   11/25/23 0759 11/25/23 1133  BP: (!) 105/42 (!) 112/48  Pulse: 74 70  Resp: 16 16  Temp: 98.2 F (36.8 C) 98.3 F (36.8 C)  SpO2: 96% 97%   General: No acute distress. Resting comfortably in room. CV: Normal S1/S2. No extra heart sounds. Warm and well-perfused. Pulm: Breathing comfortably on room air. CTAB anteriorly. No increased WOB. Abd: Soft, non-tender, non-distended. Skin:  Warm, dry. Chronic BLE edema.  Neuro: Alert and oriented x4.   Significant Procedures:  EEG 4/16: Moderate to severe diffuse encephalopathy likely related to toxic-metabolic causes. No seizure activity seen.   EGD 4/23: No esophageal varices. Small hiatal hernia. Gastritis present.   Bone marrow biopsy/aspiration 4/23: Results demonstrated plasma cell disorder, 15-20% plasma cells. Lambda restricted. Adequate megakaryocytes. Overall suggestive of smoldering myeloma per Hem/Onc.     Significant Labs and Imaging:     Latest Ref Rng & Units 11/23/2023    3:09 AM 11/22/2023   10:31 AM 11/20/2023    4:58 AM  CBC  WBC 4.0 - 10.5 K/uL 2.9  2.6  4.3   Hemoglobin 12.0 - 15.0 g/dL 9.7  78.2  9.3   Hematocrit 36.0 - 46.0 % 29.7  31.3  28.1   Platelets 150 - 400 K/uL 64  44  60       Latest Ref Rng & Units 11/23/2023    3:09 AM 11/22/2023   10:31 AM 11/20/2023    4:58 AM  CMP  Glucose 70 - 99 mg/dL 91  91  85   BUN 8 - 23 mg/dL 10  9  10    Creatinine 0.44 - 1.00 mg/dL 1.61  0.96  0.45   Sodium 135 - 145 mmol/L 133  134  133   Potassium 3.5 - 5.1 mmol/L 3.7  4.2  4.1   Chloride 98 - 111 mmol/L 103  105  104   CO2 22 - 32 mmol/L 26  23  26    Calcium  8.9 - 10.3 mg/dL 7.7  8.0  7.9   Total Protein 6.5 - 8.1 g/dL 5.5  5.7  5.4   Total Bilirubin 0.0 - 1.2 mg/dL 2.9  2.8  3.0   Alkaline Phos 38 - 126 U/L 62  61  62   AST 15 - 41 U/L 50  56  62   ALT 0 - 44 U/L 25   28  32      Imaging: CT Head 4/16: No acute abnormality.  CT Chest, A/P 4/16: No acute intrathoracic pathology. Probale thrombosis of splenic vein and/or portal vein. Cholelithiasis.   RUQ US  w Liver Doppler 4/16: Suspect portal vein thrombus which is nearly occlusive. Low portal vein velocities suggest component of underlying portal HTN. Suspect underlying cirrhosis.   Echo 4/16: LVEF 70-75%. Normal LV and RV function.   BLE DVT US  4/18: No evidence of DVT in lower extremities seen bilaterally.   TVUS 4/25: Thickened endometrium measuring 15mm.    Abdominal XR 4/25: Nonobstructive gas pattern.     Results/Tests Pending at Time of Discharge: N/a  Discharge Medications:  Allergies as of 11/25/2023       Reactions   Hydrocodone -acetaminophen     REACTION: Vomited and had toruble breathing in 2009   Nsaids    Makes sick on her stomach   Oxycodone-acetaminophen  Other (See Comments)   Pt hallucinates with this med   Tetracycline    REACTION: nausea   Bactrim  [sulfamethoxazole -trimethoprim ] Rash        Medication List     PAUSE taking these medications    hydrochlorothiazide  12.5 MG tablet Wait to take this until your doctor or other care provider tells you to start again. Commonly known as: HYDRODIURIL  Take 1 tablet (12.5 mg total) by mouth daily.   potassium chloride  SA 20 MEQ tablet Wait to take this until your doctor or other care provider tells you to start again. Commonly known as: Klor-Con  M20 Take 1 tablet (20 mEq total) by mouth daily.       TAKE these medications    acetaminophen  325 MG tablet Commonly known as: TYLENOL  Take 2 tablets (650 mg total) by mouth every 8 (eight) hours as needed for mild pain (pain score 1-3).   albuterol  108 (90 Base) MCG/ACT inhaler Commonly known as: VENTOLIN  HFA Inhale 2 puffs into the lungs every 6 (six) hours as needed for wheezing or shortness of breath.   CALCIUM  1000 + D PO Take by mouth.   diclofenac  Sodium 1  % Gel Commonly known as: VOLTAREN  Apply 2 g topically 4 (four) times daily. What changed:  when to take this reasons to take this   Euthyrox  125 MCG tablet Generic drug: levothyroxine  TAKE 1 TABLET BY MOUTH ONCE DAILY BEFORE BREAKFAST. NEED  TSH  CHECK  AND  OFFICE  VISIT  BEFORE  NEXT  REFILL.   fexofenadine  180 MG tablet Commonly known as: ALLEGRA  Take 1 tablet (180 mg total) by mouth daily.   fluticasone  50  MCG/ACT nasal spray Commonly known as: FLONASE  Place 2 sprays into both nostrils daily. Place 2 sprays in each nostril daily   folic acid  1 MG tablet Commonly known as: FOLVITE  Take 2 tablets (2 mg total) by mouth daily.   Gerhardt's butt cream Crea Apply 1 Application topically daily. Start taking on: Nov 26, 2023   lactulose  10 GM/15ML solution Commonly known as: CHRONULAC  Take 30 mLs (20 g total) by mouth 3 (three) times daily.   multivitamin tablet Take 1 tablet by mouth daily.   omeprazole  20 MG capsule Commonly known as: PRILOSEC Take 1 capsule (20 mg total) by mouth daily.   rosuvastatin  20 MG tablet Commonly known as: CRESTOR  Take 1 tablet by mouth once daily What changed: when to take this   tamsulosin  0.4 MG Caps capsule Commonly known as: FLOMAX  Take 1 capsule (0.4 mg total) by mouth daily. Start taking on: Nov 26, 2023   traMADol  50 MG tablet Commonly known as: ULTRAM  Take 1 tablet (50 mg total) by mouth every 8 (eight) hours as needed for moderate pain (pain score 4-6) or severe pain (pain score 7-10). What changed:  when to take this reasons to take this   vitamin D3 25 MCG tablet Commonly known as: CHOLECALCIFEROL  Take 1 tablet (1,000 Units total) by mouth daily.        Discharge Instructions: Please refer to Patient Instructions section of EMR for full details.  Patient was counseled important signs and symptoms that should prompt return to medical care, changes in medications, dietary instructions, activity restrictions, and follow  up appointments.   Follow-Up Appointments:  Contact information for after-discharge care     Destination     [facility] Preferred SNF .   Service: Skilled Nursing Contact information: [address] [place]  [address] [phone]                     Carey Chapman, MD 11/25/2023, 11:57 AM PGY-1, [facility] Family Medicine

## 2023-11-25 NOTE — TOC Transition Note (Signed)
 Transition of Care [facility]) - Discharge Note   Patient Details  Name: Brianna Wheeler MRN: [id number] Date of Birth: 12/30/1955  [facility] CM/SW Contact:  Carmon Christen, LCSWA Phone Number: 11/25/2023, 12:56 PM   Clinical Narrative:     Patient will DC to: [facility]  Anticipated DC date: 11/25/2023  Transport by: Lyna Sandhoff  ?  Per MD patient ready for DC to [facility] . RN, patient, patient's family, Shonte with APS,and facility notified of DC. Discharge Summary sent to facility. RN given number for report [phone] RM#[id number]. DC packet on chart. Ambulance transport requested for patient.  CSW signing off.     Barriers to Discharge: Continued Medical Work up   Patient Goals and CMS Choice            Discharge Placement                       Discharge Plan and Services Additional resources added to the After Visit Summary for   In-house Referral: Clinical Social Work Discharge Planning Services: CM Consult              DME Agency: NA                  Social Drivers of Health (SDOH) Interventions SDOH Screenings   Food Insecurity: No Food Insecurity (11/07/2023)  Housing: Low Risk  (11/07/2023)  Transportation Needs: No Transportation Needs (11/07/2023)  Utilities: Not At Risk (11/07/2023)  Alcohol Screen: Low Risk  (09/30/2023)  Depression (PHQ2-9): Low Risk  (09/30/2023)  Financial Resource Strain: Low Risk  (09/30/2023)  Physical Activity: Insufficiently Active (09/30/2023)  Social Connections: Moderately Integrated (11/07/2023)  Stress: No Stress Concern Present (09/30/2023)  Tobacco Use: Low Risk  (11/13/2023)  Health Literacy: Adequate Health Literacy (09/30/2023)     Readmission Risk Interventions     No data to display

## 2023-11-25 NOTE — Plan of Care (Signed)

## 2023-11-25 NOTE — Assessment & Plan Note (Addendum)
 Stable, prior extensive work-up largely negative. Likely secondary to metabolic liver disease. GI signed off with no indications for liver biopsy. - Continue lactulose  20 mg TID, titrate to 3-4 stools/day

## 2023-11-25 NOTE — Assessment & Plan Note (Addendum)
 DMII: stable, continue CBG twice daily HTN: normotensive, holding home BP meds Hypothyroidism: continue home synthroid  HLD: restart home statin at discharge Pain: continue home tramadol  prn Vit D deficiency: continue to supplement, will need outpatient Vit D level checked . Portal venous thrombus: Found on admission, stable.  Outpatient GI follow-up. Urinary retention: Foley placed from 4/19 - 4/23.  Now voiding spontaneously.  Continue Flomax  0.4 mg daily.

## 2023-11-25 NOTE — TOC Progression Note (Addendum)
 Transition of Care [facility]) - Progression Note    Patient Details  Name: Brianna Wheeler MRN: [id number] Date of Birth: February 21, 1956  [facility]) CM/SW Contact  Carmon Christen, LCSWA Phone Number: 11/25/2023, 10:09 AM  Clinical Narrative:     Insurance authorization approved from 5/4-5/7. Plan Auth ID# [id number] Auth ID# [id number]. Patient has SNF bed at [facility]. CSW spoke with Tonya with [facility] who confirmed patient can dc over today if medically stable.Hilarie Lovely church friend of patient is going to pick up patients son and take him home. CSW informed Shonte APS Child psychotherapist.CSW informed MD. CSW will continue to follow.  Expected Discharge Plan: Skilled Nursing Facility Barriers to Discharge: Continued Medical Work up  Expected Discharge Plan and Services In-house Referral: Clinical Social Work Discharge Planning Services: CM Consult   Living arrangements for the past 2 months: Single Family Home                   DME Agency: NA                   Social Determinants of Health (SDOH) Interventions SDOH Screenings   Food Insecurity: No Food Insecurity (11/07/2023)  Housing: Low Risk  (11/07/2023)  Transportation Needs: No Transportation Needs (11/07/2023)  Utilities: Not At Risk (11/07/2023)  Alcohol Screen: Low Risk  (09/30/2023)  Depression (PHQ2-9): Low Risk  (09/30/2023)  Financial Resource Strain: Low Risk  (09/30/2023)  Physical Activity: Insufficiently Active (09/30/2023)  Social Connections: Moderately Integrated (11/07/2023)  Stress: No Stress Concern Present (09/30/2023)  Tobacco Use: Low Risk  (11/13/2023)  Health Literacy: Adequate Health Literacy (09/30/2023)    Readmission Risk Interventions     No data to display

## 2023-11-25 NOTE — Progress Notes (Signed)
     Daily Progress Note Intern Pager: [phone]  Patient name: Brianna Wheeler Medical record number: [id number] Date of birth: Feb 18, 1956 Age: 68 y.o. Gender: female  Primary Care Provider: Kandis Ormond, DO Consultants: Oncology, GI, IR Code Status: Full  Pt Overview and Major Events to Date:  4/16: Admitted 4/19: Foley placed 4/23: Foley removed. EGD, BMA completed GI signed off.   Assessment and Plan:  Brianna Wheeler 68 y.o. F initialyl admitted for AMS and sepsis found to have hepatic encephalopathy now improved. Also found to have new liver cirrhosis, myeloma, and AUB with thickened endometrium. Will require additional workup outpatient, overall stable at this time for SNF.  Assessment & Plan Cirrhosis of liver without ascites (HCC)  Thrombocytopenia Stable, prior extensive work-up largely negative. Likely secondary to metabolic liver disease. GI signed off with no indications for liver biopsy. - Continue lactulose  20 mg TID, titrate to 3-4 stools/day Vaginal bleeding Chronic bleeding s/p TVUS showed abnormal endometrial thickening 15 mm. - Outpatient follow up with Gyn and endometrial biopsy, depending on GOC - Received Depo Provera  long-acting injectable 11/22/2023 Smoldering myeloma Bone marrow biopsy/aspiration suggestive of smoldering myeloma.  - Heme/onc following, appreciate recs - Will need outpatient follow up for treatment of smoldering myeloma. Anemia Macrocytic, likely secondary to myeloma and chronic vaginal bleeding. - EPO per Heme/Onc - Vaginal bleeding/AUB workup as above Chronic health problem DMII: stable, continue CBG twice daily HTN: normotensive, holding home BP meds Hypothyroidism: continue home synthroid  HLD: restart home statin at discharge Pain: continue home tramadol  prn Vit D deficiency: continue to supplement, will need outpatient Vit D level checked . Portal venous thrombus: Found on admission, stable.  Outpatient GI follow-up. Urinary  retention: Foley placed from 4/19 - 4/23.  Now voiding spontaneously.  Continue Flomax  0.4 mg daily.  FEN/GI: Reg diet PPx: SCDs Dispo: SNF pending availability   Subjective:  Reports doing alright this morning. No changes or complaints this morning.   Objective: Temp:  [97.6 F (36.4 C)-98.4 F (36.9 C)] 98.1 F (36.7 C) (05/05 0541) Pulse Rate:  [73-88] 74 (05/05 0541) Resp:  [16-20] 18 (05/05 0541) BP: (98-117)/(44-92) 98/49 (05/05 0541) SpO2:  [96 %-99 %] 99 % (05/05 0541) Physical Exam: General: No acute distress Resting comfortably in room. CV: Normal S1/S2. No extra heart sounds. Warm and well-perfused. Pulm: Breathing comfortably on room air. CTAB anteriorly. No increased WOB. Abd: Soft, non-tender, non-distended. Skin/Ext:  Warm, dry. Chronic BLE edema.  Neuro: Alert and oriented x4.   Laboratory: Most recent CBC Lab Results  Component Value Date   WBC 2.9 (L) 11/23/2023   HGB 9.7 (L) 11/23/2023   HCT 29.7 (L) 11/23/2023   MCV 109.6 (H) 11/23/2023   PLT 64 (L) 11/23/2023   Most recent BMP    Latest Ref Rng & Units 11/23/2023    3:09 AM  BMP  Glucose 70 - 99 mg/dL 91   BUN 8 - 23 mg/dL 10   Creatinine 1.47 - 1.00 mg/dL 8.29   Sodium 562 - 130 mmol/L 133   Potassium 3.5 - 5.1 mmol/L 3.7   Chloride 98 - 111 mmol/L 103   CO2 22 - 32 mmol/L 26   Calcium  8.9 - 10.3 mg/dL 7.7     Carey Chapman, MD 11/25/2023, 7:51 AM  PGY-1, [place] [facility] FPTS Intern pager: [phone], text pages welcome Secure chat group [facility] Teaching Service

## 2023-11-26 DIAGNOSIS — K219 Gastro-esophageal reflux disease without esophagitis: Secondary | ICD-10-CM | POA: Diagnosis not present

## 2023-11-26 DIAGNOSIS — J309 Allergic rhinitis, unspecified: Secondary | ICD-10-CM | POA: Diagnosis not present

## 2023-11-26 DIAGNOSIS — E039 Hypothyroidism, unspecified: Secondary | ICD-10-CM | POA: Diagnosis not present

## 2023-11-26 DIAGNOSIS — K7460 Unspecified cirrhosis of liver: Secondary | ICD-10-CM | POA: Diagnosis not present

## 2023-11-26 DIAGNOSIS — E785 Hyperlipidemia, unspecified: Secondary | ICD-10-CM | POA: Diagnosis not present

## 2023-11-27 DIAGNOSIS — K7460 Unspecified cirrhosis of liver: Secondary | ICD-10-CM | POA: Diagnosis not present

## 2023-11-27 DIAGNOSIS — R601 Generalized edema: Secondary | ICD-10-CM | POA: Diagnosis not present

## 2023-11-27 DIAGNOSIS — M6281 Muscle weakness (generalized): Secondary | ICD-10-CM | POA: Diagnosis not present

## 2023-11-27 DIAGNOSIS — R2689 Other abnormalities of gait and mobility: Secondary | ICD-10-CM | POA: Diagnosis not present

## 2023-11-27 DIAGNOSIS — R296 Repeated falls: Secondary | ICD-10-CM | POA: Diagnosis not present

## 2023-11-28 DIAGNOSIS — K7460 Unspecified cirrhosis of liver: Secondary | ICD-10-CM | POA: Diagnosis not present

## 2023-11-29 DIAGNOSIS — I82890 Acute embolism and thrombosis of other specified veins: Secondary | ICD-10-CM | POA: Diagnosis not present

## 2023-11-29 DIAGNOSIS — D472 Monoclonal gammopathy: Secondary | ICD-10-CM | POA: Diagnosis not present

## 2023-11-29 DIAGNOSIS — I81 Portal vein thrombosis: Secondary | ICD-10-CM | POA: Diagnosis not present

## 2023-12-04 DIAGNOSIS — R296 Repeated falls: Secondary | ICD-10-CM | POA: Diagnosis not present

## 2023-12-04 DIAGNOSIS — R2689 Other abnormalities of gait and mobility: Secondary | ICD-10-CM | POA: Diagnosis not present

## 2023-12-04 DIAGNOSIS — R600 Localized edema: Secondary | ICD-10-CM | POA: Diagnosis not present

## 2023-12-04 DIAGNOSIS — M6281 Muscle weakness (generalized): Secondary | ICD-10-CM | POA: Diagnosis not present

## 2023-12-04 DIAGNOSIS — K7460 Unspecified cirrhosis of liver: Secondary | ICD-10-CM | POA: Diagnosis not present

## 2023-12-04 DIAGNOSIS — R601 Generalized edema: Secondary | ICD-10-CM | POA: Diagnosis not present

## 2023-12-09 ENCOUNTER — Encounter (HOSPITAL_COMMUNITY): Payer: Self-pay

## 2023-12-09 ENCOUNTER — Emergency Department (HOSPITAL_COMMUNITY)

## 2023-12-09 ENCOUNTER — Inpatient Hospital Stay (HOSPITAL_COMMUNITY)
Admission: EM | Admit: 2023-12-09 | Discharge: 2023-12-30 | DRG: 432 | Disposition: A | Discharge status: Hospice | Source: Skilled Nursing Facility | Attending: Family Medicine | Admitting: Family Medicine

## 2023-12-09 DIAGNOSIS — R339 Retention of urine, unspecified: Secondary | ICD-10-CM | POA: Diagnosis present

## 2023-12-09 DIAGNOSIS — R531 Weakness: Secondary | ICD-10-CM | POA: Diagnosis not present

## 2023-12-09 DIAGNOSIS — R110 Nausea: Secondary | ICD-10-CM | POA: Diagnosis not present

## 2023-12-09 DIAGNOSIS — Z885 Allergy status to narcotic agent status: Secondary | ICD-10-CM

## 2023-12-09 DIAGNOSIS — E876 Hypokalemia: Secondary | ICD-10-CM | POA: Diagnosis present

## 2023-12-09 DIAGNOSIS — I700 Atherosclerosis of aorta: Secondary | ICD-10-CM | POA: Diagnosis not present

## 2023-12-09 DIAGNOSIS — G9341 Metabolic encephalopathy: Secondary | ICD-10-CM | POA: Diagnosis present

## 2023-12-09 DIAGNOSIS — R601 Generalized edema: Secondary | ICD-10-CM | POA: Diagnosis not present

## 2023-12-09 DIAGNOSIS — Z6841 Body Mass Index (BMI) 40.0 and over, adult: Secondary | ICD-10-CM

## 2023-12-09 DIAGNOSIS — G319 Degenerative disease of nervous system, unspecified: Secondary | ICD-10-CM | POA: Diagnosis not present

## 2023-12-09 DIAGNOSIS — I6782 Cerebral ischemia: Secondary | ICD-10-CM | POA: Diagnosis not present

## 2023-12-09 DIAGNOSIS — R079 Chest pain, unspecified: Secondary | ICD-10-CM | POA: Diagnosis not present

## 2023-12-09 DIAGNOSIS — E119 Type 2 diabetes mellitus without complications: Secondary | ICD-10-CM | POA: Diagnosis not present

## 2023-12-09 DIAGNOSIS — K8020 Calculus of gallbladder without cholecystitis without obstruction: Secondary | ICD-10-CM | POA: Diagnosis not present

## 2023-12-09 DIAGNOSIS — K766 Portal hypertension: Secondary | ICD-10-CM | POA: Diagnosis present

## 2023-12-09 DIAGNOSIS — K7460 Unspecified cirrhosis of liver: Secondary | ICD-10-CM | POA: Diagnosis not present

## 2023-12-09 DIAGNOSIS — Z881 Allergy status to other antibiotic agents status: Secondary | ICD-10-CM | POA: Diagnosis not present

## 2023-12-09 DIAGNOSIS — L24A2 Irritant contact dermatitis due to fecal, urinary or dual incontinence: Secondary | ICD-10-CM | POA: Diagnosis present

## 2023-12-09 DIAGNOSIS — E785 Hyperlipidemia, unspecified: Secondary | ICD-10-CM | POA: Diagnosis not present

## 2023-12-09 DIAGNOSIS — Z79899 Other long term (current) drug therapy: Secondary | ICD-10-CM | POA: Diagnosis not present

## 2023-12-09 DIAGNOSIS — D61818 Other pancytopenia: Secondary | ICD-10-CM | POA: Diagnosis not present

## 2023-12-09 DIAGNOSIS — A419 Sepsis, unspecified organism: Secondary | ICD-10-CM | POA: Diagnosis not present

## 2023-12-09 DIAGNOSIS — Z515 Encounter for palliative care: Secondary | ICD-10-CM

## 2023-12-09 DIAGNOSIS — R55 Syncope and collapse: Secondary | ICD-10-CM | POA: Diagnosis not present

## 2023-12-09 DIAGNOSIS — K7581 Nonalcoholic steatohepatitis (NASH): Secondary | ICD-10-CM | POA: Diagnosis present

## 2023-12-09 DIAGNOSIS — R4182 Altered mental status, unspecified: Secondary | ICD-10-CM | POA: Diagnosis not present

## 2023-12-09 DIAGNOSIS — Z7189 Other specified counseling: Secondary | ICD-10-CM | POA: Diagnosis not present

## 2023-12-09 DIAGNOSIS — R5381 Other malaise: Secondary | ICD-10-CM | POA: Diagnosis present

## 2023-12-09 DIAGNOSIS — K7469 Other cirrhosis of liver: Secondary | ICD-10-CM | POA: Diagnosis not present

## 2023-12-09 DIAGNOSIS — Z7401 Bed confinement status: Secondary | ICD-10-CM | POA: Diagnosis not present

## 2023-12-09 DIAGNOSIS — R197 Diarrhea, unspecified: Secondary | ICD-10-CM | POA: Diagnosis present

## 2023-12-09 DIAGNOSIS — Z751 Person awaiting admission to adequate facility elsewhere: Secondary | ICD-10-CM

## 2023-12-09 DIAGNOSIS — E8770 Fluid overload, unspecified: Secondary | ICD-10-CM | POA: Diagnosis present

## 2023-12-09 DIAGNOSIS — M549 Dorsalgia, unspecified: Secondary | ICD-10-CM | POA: Diagnosis not present

## 2023-12-09 DIAGNOSIS — J4540 Moderate persistent asthma, uncomplicated: Secondary | ICD-10-CM | POA: Diagnosis present

## 2023-12-09 DIAGNOSIS — E66813 Obesity, class 3: Secondary | ICD-10-CM | POA: Diagnosis present

## 2023-12-09 DIAGNOSIS — N179 Acute kidney failure, unspecified: Secondary | ICD-10-CM | POA: Diagnosis not present

## 2023-12-09 DIAGNOSIS — Z789 Other specified health status: Secondary | ICD-10-CM

## 2023-12-09 DIAGNOSIS — I81 Portal vein thrombosis: Secondary | ICD-10-CM | POA: Diagnosis not present

## 2023-12-09 DIAGNOSIS — Z66 Do not resuscitate: Secondary | ICD-10-CM | POA: Diagnosis not present

## 2023-12-09 DIAGNOSIS — D472 Monoclonal gammopathy: Secondary | ICD-10-CM | POA: Diagnosis present

## 2023-12-09 DIAGNOSIS — R410 Disorientation, unspecified: Secondary | ICD-10-CM | POA: Diagnosis not present

## 2023-12-09 DIAGNOSIS — E039 Hypothyroidism, unspecified: Secondary | ICD-10-CM | POA: Diagnosis present

## 2023-12-09 DIAGNOSIS — R188 Other ascites: Secondary | ICD-10-CM | POA: Diagnosis present

## 2023-12-09 DIAGNOSIS — R5383 Other fatigue: Secondary | ICD-10-CM | POA: Diagnosis present

## 2023-12-09 DIAGNOSIS — M545 Low back pain, unspecified: Secondary | ICD-10-CM | POA: Diagnosis not present

## 2023-12-09 DIAGNOSIS — R161 Splenomegaly, not elsewhere classified: Secondary | ICD-10-CM | POA: Diagnosis not present

## 2023-12-09 DIAGNOSIS — L8990 Pressure ulcer of unspecified site, unspecified stage: Secondary | ICD-10-CM | POA: Insufficient documentation

## 2023-12-09 DIAGNOSIS — R000 Tachycardia, unspecified: Secondary | ICD-10-CM

## 2023-12-09 DIAGNOSIS — K7682 Hepatic encephalopathy: Secondary | ICD-10-CM | POA: Insufficient documentation

## 2023-12-09 DIAGNOSIS — R609 Edema, unspecified: Secondary | ICD-10-CM | POA: Diagnosis not present

## 2023-12-09 DIAGNOSIS — Z882 Allergy status to sulfonamides status: Secondary | ICD-10-CM

## 2023-12-09 DIAGNOSIS — E7220 Disorder of urea cycle metabolism, unspecified: Secondary | ICD-10-CM | POA: Insufficient documentation

## 2023-12-09 DIAGNOSIS — I959 Hypotension, unspecified: Secondary | ICD-10-CM | POA: Diagnosis not present

## 2023-12-09 DIAGNOSIS — R32 Unspecified urinary incontinence: Secondary | ICD-10-CM | POA: Diagnosis present

## 2023-12-09 DIAGNOSIS — I10 Essential (primary) hypertension: Secondary | ICD-10-CM | POA: Diagnosis present

## 2023-12-09 DIAGNOSIS — R404 Transient alteration of awareness: Secondary | ICD-10-CM | POA: Diagnosis not present

## 2023-12-09 LAB — COMPREHENSIVE METABOLIC PANEL WITH GFR
ALT: 34 U/L (ref 0–44)
AST: 87 U/L — ABNORMAL HIGH (ref 15–41)
Albumin: 1.6 g/dL — ABNORMAL LOW (ref 3.5–5.0)
Alkaline Phosphatase: 84 U/L (ref 38–126)
Anion gap: 10 (ref 5–15)
BUN: 11 mg/dL (ref 8–23)
CO2: 32 mmol/L (ref 22–32)
Calcium: 8.6 mg/dL — ABNORMAL LOW (ref 8.9–10.3)
Chloride: 98 mmol/L (ref 98–111)
Creatinine, Ser: 1.26 mg/dL — ABNORMAL HIGH (ref 0.44–1.00)
GFR, Estimated: 46 mL/min — ABNORMAL LOW (ref >60)
Glucose, Bld: 99 mg/dL (ref 70–99)
Potassium: 2.2 mmol/L — CL (ref 3.5–5.1)
Sodium: 140 mmol/L (ref 135–145)
Total Bilirubin: 3.5 mg/dL — ABNORMAL HIGH (ref 0.0–1.2)
Total Protein: 6.7 g/dL (ref 6.5–8.1)

## 2023-12-09 LAB — CBC
HCT: 34.6 % — ABNORMAL LOW (ref 36.0–46.0)
Hemoglobin: 11.3 g/dL — ABNORMAL LOW (ref 12.0–15.0)
MCH: 33.4 pg (ref 26.0–34.0)
MCHC: 32.7 g/dL (ref 30.0–36.0)
MCV: 102.4 fL — ABNORMAL HIGH (ref 80.0–100.0)
Platelets: 69 10*3/uL — ABNORMAL LOW (ref 150–400)
RBC: 3.38 MIL/uL — ABNORMAL LOW (ref 3.87–5.11)
RDW: 16.1 % — ABNORMAL HIGH (ref 11.5–15.5)
WBC: 3.7 10*3/uL — ABNORMAL LOW (ref 4.0–10.5)
nRBC: 0 % (ref 0.0–0.2)

## 2023-12-09 LAB — URINALYSIS, W/ REFLEX TO CULTURE (INFECTION SUSPECTED)
Bilirubin Urine: NEGATIVE
Glucose, UA: NEGATIVE mg/dL
Hgb urine dipstick: NEGATIVE
Ketones, ur: NEGATIVE mg/dL
Leukocytes,Ua: NEGATIVE
Nitrite: NEGATIVE
Protein, ur: NEGATIVE mg/dL
Specific Gravity, Urine: 1.006 (ref 1.005–1.030)
pH: 7 (ref 5.0–8.0)

## 2023-12-09 LAB — PROTIME-INR
INR: 1.9 — ABNORMAL HIGH (ref 0.8–1.2)
Prothrombin Time: 22.4 s — ABNORMAL HIGH (ref 11.4–15.2)

## 2023-12-09 LAB — CBG MONITORING, ED: Glucose-Capillary: 98 mg/dL (ref 70–99)

## 2023-12-09 LAB — MAGNESIUM: Magnesium: 1.2 mg/dL — ABNORMAL LOW (ref 1.7–2.4)

## 2023-12-09 LAB — I-STAT CG4 LACTIC ACID, ED
Lactic Acid, Venous: 3 mmol/L (ref 0.5–1.9)
Lactic Acid, Venous: 2.9 mmol/L (ref 0.5–1.9)

## 2023-12-09 LAB — AMMONIA: Ammonia: 65 umol/L — ABNORMAL HIGH (ref 9–35)

## 2023-12-09 MED ORDER — FUROSEMIDE 10 MG/ML IJ SOLN
40.0000 mg | Freq: Once | INTRAMUSCULAR | Status: AC
  Administered 2023-12-09: 40 mg via INTRAVENOUS
  Filled 2023-12-09: qty 4

## 2023-12-09 MED ORDER — POTASSIUM CHLORIDE 10 MEQ/100ML IV SOLN
10.0000 meq | INTRAVENOUS | Status: AC
  Administered 2023-12-09 (×3): 10 meq via INTRAVENOUS
  Filled 2023-12-09 (×3): qty 100

## 2023-12-09 MED ORDER — MAGNESIUM SULFATE 2 GM/50ML IV SOLN
2.0000 g | Freq: Once | INTRAVENOUS | Status: AC
  Administered 2023-12-09: 2 g via INTRAVENOUS
  Filled 2023-12-09: qty 50

## 2023-12-09 MED ORDER — LIDOCAINE 5 % EX PTCH
1.0000 | MEDICATED_PATCH | Freq: Every day | CUTANEOUS | Status: DC
  Administered 2023-12-10 – 2023-12-28 (×14): 1 via TRANSDERMAL
  Filled 2023-12-09 (×18): qty 1

## 2023-12-09 MED ORDER — POTASSIUM CHLORIDE 10 MEQ/100ML IV SOLN
10.0000 meq | INTRAVENOUS | Status: AC
Start: 2023-12-09 — End: 2023-12-10
  Administered 2023-12-09 – 2023-12-10 (×3): 10 meq via INTRAVENOUS
  Filled 2023-12-09 (×3): qty 100

## 2023-12-09 MED ORDER — FENTANYL CITRATE PF 50 MCG/ML IJ SOSY: 25.0000 ug | PREFILLED_SYRINGE | Freq: Once | INTRAMUSCULAR | Status: DC

## 2023-12-09 MED ORDER — LACTULOSE 10 GM/15ML PO SOLN
20.0000 g | Freq: Three times a day (TID) | ORAL | Status: DC
  Administered 2023-12-09: 20 g via ORAL
  Filled 2023-12-09: qty 30

## 2023-12-09 MED ORDER — LACTATED RINGERS IV BOLUS (SEPSIS)
500.0000 mL | Freq: Once | INTRAVENOUS | Status: AC
  Administered 2023-12-09: 500 mL via INTRAVENOUS

## 2023-12-09 NOTE — Assessment & Plan Note (Addendum)
 DMII: stable, continue CBG twice daily HTN: hypotensive, holding home BP meds Hypothyroidism: will restart home synthroid  with improved mental status HLD: restart home statin at discharge Pain: Hold home tramadol , Ordered x1 fentanyl   Vit D deficiency: continue to supplement, will need outpatient Vit D level checked . Portal venous thrombus: Found on admission, stable.  Patient GI follow-up. Urinary retention: Foley placed from 4/19 - 4/23.  Now voiding spontaneously.  To restart  Flomax  0.4 mg daily.

## 2023-12-09 NOTE — Assessment & Plan Note (Addendum)
 Suspected to be secondary to liver cirrhosis which was recently diagnosed with patient.  At last admission patient was discharged on lactulose , difficult to ascertain at this time if patient was compliant with her home medication however she was able to express no bowel movement in the last few days.  Will initiate lactulose  at this time and plan to obtain head imaging including head CT and brain MRI.  - admit to FMTS, progressive with Dr. Andree Kayser attending - CT abdomen/pelvis without contrast (given abdominal tenderness) -Restarted home lactulose , goal BM x3 a day - avoid CNS acting medications - Ordered Head CT and brain MRI  - s/p  LR 100 cc/hr g - repeat BMP - trend lactate - Currently n.p.o. - Fall precautions - SCDs for VTE prophylaxis, PLT less than 100,000

## 2023-12-09 NOTE — Assessment & Plan Note (Addendum)
 This is a very ill patient with recent diagnosis of NASH liver cirrhosis.  Labs on admission shows elevated ammonia, PTT, INR, AST, bilirubin and low albumin. CT showed liver cirrhosis with splenomegaly and mild ascites.  Has diffuse abdominal tenderness on exam suspected to be due to fluid retention, low suspicion of SBP given patient has been afebrile and doesnt appear infectious overall. Ammonia is elevated which is likely cause of patient's altered mental status. - Restarted home lactulose  - Fall precautions - Monitor routine vitals, fever curve - Rx one-time dose of Lasix  for volume overload

## 2023-12-09 NOTE — Hospital Course (Addendum)
 Brianna Wheeler is a 68 y.o.female with a history of NASH cirrhosis, type 2 diabetes, hypertension, hypothyroidism, hyperlipidemia, HLD and portal venous thrombosis who was admitted to the [facility] Medicine Teaching Service at [facility] for metabolic encephalopathy.   Her hospital course is detailed below:  Metabolic encephalopathy, cirrhosis Patient presented from SNF with altered mental status, hypokalemia and hyperammonemia which were discovered at her facility.  On arrival she unresponsive and nonverbal, her vital signs were stable she was without fever and she had a nonfocal neurologic exam.  Lactic acid was 3.0 and her white blood cell count was 3.7.  CT head and MRI brain were ordered with no acute intracranial processes found on imaging.  CT abdomen pelvis was obtained which showed her known hepatic cirrhosis with portal hypertension as well as mild cardiomegaly, ascites and splenomegaly.  Started on lactulose  and titrated to 40 g four times daily with improvement.  She received rifaximin  while hospitalized, but unfortunately this was determined to be unaffordable in the outpatient setting.  Patient had multiple conversations with Palliative Care and ultimately decided to transition to comfort care. She remained in the hospital while placement with an appropriate facility was located. As her condition deteriorated, she was evaluated as a candidate for hospice services. She received a bed offer at [facility] and discharged on 6/9.  Tachycardia Unclear causes. Considered  PE given risk factors but CTA PE was negative. No evidence of infection including SBP. Resolved spontaneously within 48 hours.   Pancytopenia stable during hospitalization.  Goals of care Continuing DNR DNI. Pt complete HCPOA. Outpatient palliative care consult placed. Continued discussion with patient during hospitalization about severity of condition and ultimately transitioned to comfort care as above.  Hypokalemia Potassium  was 2.2 on arrival, patient was given IV potassium supplementation with no change in the first 24 hours of admission.  As patient's mental status improved she was also given oral potassium repletion.  Stabilized on KCL 40 mEq tablets twice daily. Patient ultimately went comfort care and interventions above were discontinued.  Anasarca Likely secondary to her decompensated cirrhosis of the liver, however, given her history of hypothyroidism TSH was collected to rule out myxedema.  TSH returned and showed appropriate TSH level of 1.915.  Was given 1 round of IV Lasix  with some improvement, then restarted on home torsemide  10 mg.  Started aldactone  50 mg. Stable generalized edema at discharge but no major improvement. Patient ultimately went comfort care and interventions above were discontinued.  AKI secondary to decompensated cirrhosis of the liver, hyperammonemia Secondary to hyperammonemia related to her decompensated cirrhosis.  Improved with correction of ammonia levels.  Other chronic conditions were medically managed with home medications and formulary alternatives as necessary (type 2 diabetes, hypertension, hypothyroidism, HLD, vitamin D  deficiency, portal venous thrombus, urinary retention)   Follow-up Recommendations: Patient going hospice care at [facility], no follow-up recommendations.

## 2023-12-09 NOTE — Assessment & Plan Note (Addendum)
 In prior admission was concerned for MGUS with plan to follow up with hematologist outpatient however patient has been readmitted prior to making that appointment. - Continue close monitoring with daily CBC - Hold anticoagulants for VTE ppx with <100k PLT  - Ordered labs for haptoglobin, Lactate dehydrogenase, CBC, INR, PTT

## 2023-12-09 NOTE — ED Triage Notes (Addendum)
 Pt BIB EMS from [facility] for abnormal K+ 2.2 and AMS starting this AM. K drawn d/t elevated ammonia levels. Ammonia 90 @ facility. GCS 14 upon arrival. Normally AOX4

## 2023-12-09 NOTE — H&P (Addendum)
 Hospital Admission History and Physical Service Pager: [phone]  Patient name: Brianna Wheeler Medical record number: [id number] Date of Birth: 11-15-1955 Age: 68 y.o. Gender: female  Primary Care Provider: Kandis Ormond, DO Consultants: None  Code Status: Full Code Preferred Emergency Contact: Contact Information     Name Relation Home Work Mobile   Hennings,[name] Son [phone]  [phone]    Chief Complaint: AMS, hypokalemia   Assessment and Plan: Brianna Wheeler is a 68 y.o. female presenting from facility with AMS, found to have hypokalemia and hyperammonemia . Differential for presentation of this includes static encephalopathy due to hyperammonemia, seizure or sepsis.  Suspect patient's encephalopathy is most likely due to hepatic encephalopathy secondary to liver cirrhosis which was recently diagnosed with patient and given elevated ammonia level on admission. Seizures -less likely given extended period of waxing  and waning mental status with no real postictal state. Sepsis -less likely given patient has remained afebrile, nontoxic-appearing on exam and no notable source at this time  Assessment & Plan AMS (altered mental status) Suspected to be secondary to liver cirrhosis which was recently diagnosed with patient.  At last admission patient was discharged on lactulose , difficult to ascertain at this time if patient was compliant with her home medication however she was able to express no bowel movement in the last few days.  Will initiate lactulose  at this time and plan to obtain head imaging including head CT and brain MRI.  - admit to FMTS, progressive with Dr. Andree Kayser attending - CT abdomen/pelvis without contrast (given abdominal tenderness) -Restarted home lactulose , goal BM x3 a day - avoid CNS acting medications - Ordered Head CT and brain MRI  - s/p  LR 100 cc/hr g - repeat BMP - trend lactate - Currently n.p.o. - Fall precautions - SCDs for VTE  prophylaxis, PLT less than 100,000 Cirrhosis of liver without ascites (HCC)  Thrombocytopenia This is a very ill patient with recent diagnosis of NASH liver cirrhosis.  Labs on admission shows elevated ammonia, PTT, INR, AST, bilirubin and low albumin. CT showed liver cirrhosis with splenomegaly and mild ascites.  Has diffuse abdominal tenderness on exam suspected to be due to fluid retention, low suspicion of SBP given patient has been afebrile and doesnt appear infectious overall. Ammonia is elevated which is likely cause of patient's altered mental status. - Restarted home lactulose  - Fall precautions - Monitor routine vitals, fever curve - Rx one-time dose of Lasix  for volume overload Hypokalemia Unclear cause of patient's hypokalemia at this time.  On admission was 2.2 and EKG generally unremarkable.  So far patient has received 30 mEq K chloride IV.  Will order another 30 mEq and recheck BMP in the morning -S/p KCl 30 mEq IV - Reordered second dose of KCl 30 mEq IV - Recheck BMP in the a.m. - Currently on cardiac monitoring Anasarca Generally edematous on exam suspect due to hypoalbuminemia in the setting of on going liver cirrhosis.  Home medication includes hydrochlorothiazide  however given recent lab consistent with AKI we will give one-time dose of Lasix  with close monitoring of kidney function. - Ordered IV Lasix  40 mg -Closely monitor renal function with BMP Pancytopenia (HCC) In prior admission was concerned for MGUS with plan to follow up with hematologist outpatient however patient has been readmitted prior to making that appointment. - Continue close monitoring with daily CBC - Hold anticoagulants for VTE ppx with <100k PLT  - Ordered labs for haptoglobin, Lactate dehydrogenase, CBC,  INR, PTT AKI (acute kidney injury) (HCC) Creatinine on admission is slightly elevated at 1.27. Suspect component of hepatorenal syndrome  Received 500 cc LR bolus in the ED -Avoid nephrotoxic  agent -Obtain BMP lab in the AM Chronic health problem DMII: stable, continue CBG twice daily HTN: hypotensive, holding home BP meds Hypothyroidism: will restart home synthroid  with improved mental status HLD: restart home statin at discharge Pain: Hold home tramadol , Ordered x1 fentanyl   Vit D deficiency: continue to supplement, will need outpatient Vit D level checked . Portal venous thrombus: Found on admission, stable.  Patient GI follow-up. Urinary retention: Foley placed from 4/19 - 4/23.  Now voiding spontaneously.  To restart  Flomax  0.4 mg daily.   FEN/GI: NPO due to AMS VTE Prophylaxis: SCDs given thrombocytopenia   Disposition: Progressive   History of Present Illness:  Brianna Wheeler is a 68 y.o. female presenting with AMS from [place]. Arrived EMS due to hypokalemia at the facility to 2.2 with AMS. Ammonia also collected at the facility and resulted at 90.   History from patient very limited due to wax and weaning mental status. Unable to answer most question but stated she hasn't   Recently discharged from our service on 11/25/2023 and was admitted for altered mental status presumed to be sepsis related.  Altered mental status improved with lactulose  and antibiotic treatment.  She was also found to have MGUS which needed outpatient follow-up.  She was also worked up for abnormal uterine bleeding and received a dose of Depo-Provera .  In the ED, GCS was 14. Came in unresponsive and nonverbal. She was brought in due to elevated ammonia level at nursing home. Vital signs stable without fever. Nonfocal neurologic exam. She was not tachycardic.  Hypokalemia of 2.2.  Lactic acid found to be 3.0 with a white blood cell count of 3.7 (recent MGUS diagnosis).  CT head ans MRI brain ordered and result still pending.  CT abdomen pelvis without contrast also obtained and shows Hepatic Cirrhosis with pHTN, mild Cardiomegaly, ascites and splenomegaly.  Chest x-ray obtained and showed mild  cardiomegaly. EKG unchanged from prior not showing signs of hypokalemia.   On recheck she is more alert per EDP but she is still disoriented. Asked her to say her name and she was not sure. EDP concerned about aphasia so ordered MRI (out of window for any acute stroke treatment). Reported some tenderness in her abdomen .  Review Of Systems: Per HPI.  Pertinent Past Medical History: T2DM HTN HLD Hypothyroidism Moderate persistent asthma Osteoporosis? Remainder reviewed in history tab.   Pertinent Past Surgical History: Lumbar disc surgery   Remainder reviewed in history tab.   Pertinent Social History: Tobacco use: Never Alcohol use: None Other Substance use: None Currently from facility. Lives with son at home.  Pertinent Family History: Sister: brain cancer Son: Epilepsy Remainder reviewed in history tab.   Important Outpatient Medications: Tylenol  as needed Ventolin  inhaler Euthyrox  125 mcg daily Fexofenadine  180 mg  Lactulose  30 mL 3 times daily Omeprazole  20 mg daily Rosuvastatin  20 mg daily Tamsulosin  0.4 mg daily Tramadol  50 mg every 8 hours as needed Vitamin D3 Remainder reviewed in medication history.   Objective: BP (!) 93/48   Pulse 93   Temp 98.4 F (36.9 C) (Axillary)   Resp 17   Wt (!) 139 kg   SpO2 99%   BMI 42.74 kg/m  Exam: (Limited due to patient mental status) General: Morbidly obese, ill appearing, intermittently moaning, generally edematous Eyes: mild  sclera icterus  Neck: Full ROM, no mass  Cardiovascular: RRR, No murmur  Respiratory: CTAB, No rales or wheezing  Gastrointestinal: Diffused abdominal tenderness, mild distended without fluid wave. No guarding Neuro: ANO x 3 (but not to place)with alternating level of consciousness, no focal neurodeficits. Psych: Pleasant but with some situational awareness Extremities: +3 bilateral lower extremity edema  Labs:  CBC BMET  Recent Labs  Lab 12/09/23 1512  WBC 3.7*  HGB 11.3*  HCT  34.6*  PLT 69*   Recent Labs  Lab 12/09/23 1715  NA 140  K 2.2*  CL 98  CO2 32  BUN 11  CREATININE 1.26*  GLUCOSE 99  CALCIUM  8.6*     Lactic acid 3.0 Lactic acid 2.9 Mag 1.2 Ammonia 65 INR 1.9 PT 22.4 UA unremarkable  EKG: Sinus rhythm.  No PR interval prolongation, no QTc prolongation.  No evidence of ST elevation.  Overall unchanged from prior.  CT head without contrast: Pending   CT abdomen pelvis without contrast: IMPRESSION: 1. Hepatic cirrhosis with portal hypertension including mild splenomegaly, mild ascites, and substantial retroperitoneal collateral vessels including uphill paraesophageal varices. Splenorenal shunting is suspected. 2. Mild cardiomegaly. 3. Cholelithiasis. 4. Remote endplate compressions at L3 and chronic anterior wedging at T11. Right greater than left foraminal impingement at L4-5 due to intervertebral and facet spurring. 5.  Aortic Atherosclerosis (ICD10-I70.0).  CXR: IMPRESSION: 1. Mild enlargement of the cardiopericardial silhouette. 2. Aortic Atherosclerosis (ICD10-I70.0).  Goble Last, MD 12/09/2023, 11:46 PM PGY- 3, [facility]  FPTS Intern pager: [phone], text pages welcome Secure chat group [facility] Teaching Service

## 2023-12-09 NOTE — ED Provider Notes (Signed)
 [place] EMERGENCY DEPARTMENT AT [facility] Provider Note   CSN: [id number] Arrival date & time: 12/09/23  1453     History  Chief Complaint  Patient presents with   Abnormal Lab    CHARISA TWITTY is a 68 y.o. female.  Patient is a 68 year old female who presents with abnormal blood work.  She has a history of diabetes, hypertension, hyperlipidemia.  On chart review, she was recently admitted for urosepsis.  During that hospitalization, she was found to have new cirrhosis with elevated liver enzymes and elevated ammonia level.  She was also noted to have a splenic/portal vein thrombus.  She was diagnosed with MGUS.  She also had some abnormal uterine bleeding with a thickened endometrium.  She was supposed to get an outpatient uterine biopsy.  She had some urinary retention and had a brief Foley catheter placed but was not discharged on a Foley catheter.  She was discharged to a SNF.  She was noted to be confused by EMS.  Reportedly her baseline mental status is alert and oriented x 4.       Home Medications Prior to Admission medications   Medication Sig Start Date End Date Taking? Authorizing Provider  acetaminophen  (TYLENOL ) 325 MG tablet Take 2 tablets (650 mg total) by mouth every 8 (eight) hours as needed for mild pain (pain score 1-3). 11/25/23   Carey Chapman, MD  albuterol  (VENTOLIN  HFA) [phone] Base) MCG/ACT inhaler Inhale 2 puffs into the lungs every 6 (six) hours as needed for wheezing or shortness of breath. 09/11/22   Rochester Chuck, MD  Calcium  Carb-Cholecalciferol  (CALCIUM  1000 + D PO) Take by mouth.    [provider]  cholecalciferol  (CHOLECALCIFEROL ) 25 MCG tablet Take 1 tablet (1,000 Units total) by mouth daily. 11/25/23   Carey Chapman, MD  diclofenac  Sodium (VOLTAREN ) 1 % GEL Apply 2 g topically 4 (four) times daily. Patient taking differently: Apply 2 g topically daily as needed (low back pain; knee pain). 05/15/22   Anibal Kent, MD   fexofenadine  (ALLEGRA ) 180 MG tablet Take 1 tablet (180 mg total) by mouth daily. 09/11/22 07/22/24  Rochester Chuck, MD  fluticasone  (FLONASE ) 50 MCG/ACT nasal spray Place 2 sprays into both nostrils daily. Place 2 sprays in each nostril daily 09/11/22   Rochester Chuck, MD  folic acid  (FOLVITE ) 1 MG tablet Take 2 tablets (2 mg total) by mouth daily. 11/25/23   Carey Chapman, MD  hydrochlorothiazide  (HYDRODIURIL ) 12.5 MG tablet Take 1 tablet (12.5 mg total) by mouth daily. 02/11/23   Kandis Ormond, DO  lactulose  (CHRONULAC ) 10 GM/15ML solution Take 30 mLs (20 g total) by mouth 3 (three) times daily. 11/25/23   Carey Chapman, MD  levothyroxine  (EUTHYROX ) 125 MCG tablet TAKE 1 TABLET BY MOUTH ONCE DAILY BEFORE BREAKFAST. NEED  TSH  CHECK  AND  OFFICE  VISIT  BEFORE  NEXT  REFILL. 09/13/23   Azell Boll, MD  Multiple Vitamin (MULTIVITAMIN) tablet Take 1 tablet by mouth daily.    [provider]  Nystatin (GERHARDT'S BUTT CREAM) CREA Apply 1 Application topically daily. 11/26/23   Carey Chapman, MD  omeprazole  (PRILOSEC) 20 MG capsule Take 1 capsule (20 mg total) by mouth daily. 04/10/23   Rumball, Alison M, DO  potassium chloride  SA (KLOR-CON  M20) 20 MEQ tablet Take 1 tablet (20 mEq total) by mouth daily. 06/17/23   Rumball, Alison M, DO  rosuvastatin  (CRESTOR ) 20 MG tablet Take 1 tablet by mouth  once daily Patient taking differently: Take 20 mg by mouth at bedtime. 01/31/23   Kandis Ormond, DO  tamsulosin  (FLOMAX ) 0.4 MG CAPS capsule Take 1 capsule (0.4 mg total) by mouth daily. 11/26/23   Carey Chapman, MD  traMADol  (ULTRAM ) 50 MG tablet Take 1 tablet (50 mg total) by mouth every 8 (eight) hours as needed for moderate pain (pain score 4-6) or severe pain (pain score 7-10). 11/25/23   Carey Chapman, MD      Allergies    Hydrocodone -acetaminophen , Nsaids, Oxycodone-acetaminophen , Tetracycline, and Bactrim  [sulfamethoxazole -trimethoprim ]    Review of Systems   Review of Systems  Unable to  perform ROS: Mental status change    Physical Exam Updated Vital Signs BP (!) 114/50 (BP Location: Left Arm)   Pulse 93   Temp 98.4 F (36.9 C) (Axillary)   Resp 13   Wt (!) 139 kg   SpO2 99%   BMI 42.74 kg/m  Physical Exam Constitutional:      Appearance: She is well-developed. She is obese. She is ill-appearing.  HENT:     Head: Normocephalic and atraumatic.  Eyes:     Pupils: Pupils are equal, round, and reactive to light.  Cardiovascular:     Rate and Rhythm: Normal rate and regular rhythm.     Heart sounds: Murmur heard.  Pulmonary:     Effort: Pulmonary effort is normal. No respiratory distress.     Breath sounds: Normal breath sounds. No wheezing or rales.  Chest:     Chest wall: No tenderness.  Abdominal:     General: Bowel sounds are normal.     Palpations: Abdomen is soft.     Tenderness: There is abdominal tenderness (Generalized tenderness). There is no guarding or rebound.  Musculoskeletal:        General: Normal range of motion.     Cervical back: Normal range of motion and neck supple.     Comments: Pitting edema to lower extremities bilaterally  Lymphadenopathy:     Cervical: No cervical adenopathy.  Skin:    General: Skin is warm and dry.     Findings: No rash.  Neurological:     Mental Status: She is alert.     Comments: Patient is awake and moans to pain but is not verbalizing.  She does seem to move all extremities symmetrically.  No obvious focal deficits.     ED Results / Procedures / Treatments   Labs (all labs ordered are listed, but only abnormal results are displayed) Labs Reviewed  CBC - Abnormal; Notable for the following components:      Result Value   WBC 3.7 (*)    RBC 3.38 (*)    Hemoglobin 11.3 (*)    HCT 34.6 (*)    MCV 102.4 (*)    RDW 16.1 (*)    Platelets 69 (*)    All other components within normal limits  PROTIME-INR - Abnormal; Notable for the following components:   Prothrombin Time 22.4 (*)    INR 1.9 (*)     All other components within normal limits  URINALYSIS, W/ REFLEX TO CULTURE (INFECTION SUSPECTED) - Abnormal; Notable for the following components:   Bacteria, UA RARE (*)    All other components within normal limits  COMPREHENSIVE METABOLIC PANEL WITH GFR - Abnormal; Notable for the following components:   Potassium 2.2 (*)    Creatinine, Ser 1.26 (*)    Calcium  8.6 (*)    Albumin 1.6 (*)  AST 87 (*)    Total Bilirubin 3.5 (*)    GFR, Estimated 46 (*)    All other components within normal limits  AMMONIA - Abnormal; Notable for the following components:   Ammonia 65 (*)    All other components within normal limits  MAGNESIUM  - Abnormal; Notable for the following components:   Magnesium  1.2 (*)    All other components within normal limits  I-STAT CG4 LACTIC ACID, ED - Abnormal; Notable for the following components:   Lactic Acid, Venous 3.0 (*)    All other components within normal limits  I-STAT CG4 LACTIC ACID, ED - Abnormal; Notable for the following components:   Lactic Acid, Venous 2.9 (*)    All other components within normal limits  CULTURE, BLOOD (ROUTINE X 2)  CULTURE, BLOOD (ROUTINE X 2)  CBG MONITORING, ED    EKG EKG Interpretation Date/Time:  Monday Dec 09 2023 15:03:59 EDT Ventricular Rate:  91 PR Interval:  152 QRS Duration:  117 QT Interval:  408 QTC Calculation: 502 R Axis:   53  Text Interpretation: Sinus rhythm Nonspecific intraventricular conduction delay Nonspecific repol abnormality, inferior leads since last tracing no significant change Confirmed by Hershel Los [id number]) on 12/09/2023 3:08:07 PM  Radiology DG Chest Port 1 View Result Date: 12/09/2023 CLINICAL DATA:  Sepsis EXAM: PORTABLE CHEST 1 VIEW COMPARISON:  11/15/2023 FINDINGS: Mild enlargement of the cardiopericardial silhouette. Atherosclerotic calcification of the aortic arch. The patient is rotated to the left on today's radiograph, reducing diagnostic sensitivity and specificity. No  blunting of the costophrenic angles. No overt airspace opacity identified. IMPRESSION: 1. Mild enlargement of the cardiopericardial silhouette. 2. Aortic Atherosclerosis (ICD10-I70.0). Electronically Signed   By: Freida Jes M.D.   On: 12/09/2023 18:00   CT ABDOMEN PELVIS WO CONTRAST Result Date: 12/09/2023 CLINICAL DATA:  Acute abdominal pain EXAM: CT ABDOMEN AND PELVIS WITHOUT CONTRAST TECHNIQUE: Multidetector CT imaging of the abdomen and pelvis was performed following the standard protocol without IV contrast. RADIATION DOSE REDUCTION: This exam was performed according to the departmental dose-optimization program which includes automated exposure control, adjustment of the mA and/or kV according to patient size and/or use of iterative reconstruction technique. COMPARISON:  11/06/2023 FINDINGS: Despite efforts by the technologist and patient, motion artifact is present on today's exam and could not be eliminated. This reduces exam sensitivity and specificity. Body habitus reduces diagnostic sensitivity and specificity. Lower chest: Mild cardiomegaly. Large uphill paraesophageal varices. Hepatobiliary: Hepatic cirrhosis. Depending gallstones in the gallbladder. Pancreas: Indistinct due to motion artifact. Spleen: Mild splenomegaly. Adrenals/Urinary Tract: Unremarkable Stomach/Bowel: No dilated bowel. Formed stool in the distal colon and rectum. Vascular/Lymphatic: Atherosclerosis is present, including aortoiliac atherosclerotic disease. Evidence of substantial pulmonary venous hypertension including large retroperitoneal collateral vessels and uphill paraesophageal varices. Splenorenal shunting is suspected. Dilated collateral venous structures extend caudad in the retroperitoneum. Reproductive: Unremarkable Other: Mild ascites. Presacral edema. Substantial subcutaneous edema along the flanks. Musculoskeletal: Remote endplate compressions at L3. Chronic anterior wedging T11. Right greater than left  foraminal impingement at L4-5 due to intervertebral and facet spurring. IMPRESSION: 1. Hepatic cirrhosis with portal hypertension including mild splenomegaly, mild ascites, and substantial retroperitoneal collateral vessels including uphill paraesophageal varices. Splenorenal shunting is suspected. 2. Mild cardiomegaly. 3. Cholelithiasis. 4. Remote endplate compressions at L3 and chronic anterior wedging at T11. Right greater than left foraminal impingement at L4-5 due to intervertebral and facet spurring. 5.  Aortic Atherosclerosis (ICD10-I70.0). Electronically Signed   By: Freida Jes M.D.   On: 12/09/2023  17:59    Procedures Procedures    Medications Ordered in ED Medications  magnesium  sulfate IVPB 2 g 50 mL (2 g Intravenous New Bag/Given 12/09/23 2123)  lactated ringers  bolus 500 mL (0 mLs Intravenous Stopped 12/09/23 1740)  potassium chloride  10 mEq in 100 mL IVPB (0 mEq Intravenous Stopped 12/09/23 2123)    ED Course/ Medical Decision Making/ A&P                                 Medical Decision Making Amount and/or Complexity of Data Reviewed Labs: ordered. Radiology: ordered.  Risk Prescription drug management. Decision regarding hospitalization.   Patient is a 68 year old female who presents with altered mental status.  She is not noted to be febrile, even on a rectal temp.  Her white count is not elevated.  She has some pancytopenia which is noted from her prior admission.  Her potassium is markedly low at 2.2.  She was started on IV potassium replacement.  Her magnesium  level was also low and she was started on IV magnesium  replacement.  She was given IV fluids.  Her lactates are elevated but she does not have an elevated WBC count or fever so I did not start her on antibiotics at this point.  She does not have any obvious source of infection.  Her chest x-ray does not show any evidence of pneumonia.  This was interpreted by me confirmed by the radiologist.  Head CT does  not show any acute abnormality.  Urinalysis is not consistent with infection.  CT scan of her abdomen pelvis did not show anything that appeared to be acute.  She does have cirrhosis changes which was apparent on her prior admission.  On reexam, she does seem to be more alert and can tell me her name.  Although she keeps telling me her name over and over again.  Her ammonia level is slightly elevated but actually better than when she recently had it checked as an outpatient based on chart review.  I did order an MRI to rule out possible stroke which will be followed up from the admitting team.  She would not be a candidate for any stroke intervention at this point.  Discussed with the family medicine medicine who admit the patient for further treatment.  CRITICAL CARE Performed by: Hershel Los Total critical care time: 70 minutes Critical care time was exclusive of separately billable procedures and treating other patients. Critical care was necessary to treat or prevent imminent or life-threatening deterioration. Critical care was time spent personally by me on the following activities: development of treatment plan with patient and/or surrogate as well as nursing, discussions with consultants, evaluation of patient's response to treatment, examination of patient, obtaining history from patient or surrogate, ordering and performing treatments and interventions, ordering and review of laboratory studies, ordering and review of radiographic studies, pulse oximetry and re-evaluation of patient's condition.   Final Clinical Impression(s) / ED Diagnoses Final diagnoses:  Altered mental status, unspecified altered mental status type  Hypokalemia  Hypomagnesemia    Rx / DC Orders ED Discharge Orders     None         Hershel Los, MD 12/09/23 2221

## 2023-12-09 NOTE — H&P (Signed)
  Call Pager [phone] for any questions or notifications regarding this patient FMTS Attending Admission Note: Violetta Grice MD Attending pager:[phone] I  have seen and examined this patient, reviewed their chart. I have discussed this patient with the resident. I agree with the resident's findings, assessment and care plan.  Resident H and P currently in progress  HPI  pateint well known to me, recently on my hospital service. Brought in by EMS from NH where she was found to be altered, had low potassium In ED, on my exam, not oriented x3. Did not recognize me. Audibly moaning and disoriented. CV Rate 88 very loud S2, no murmur. Bounding heart sounds. ABDOMEN is obese, no obvious focal area of tenderness. Exam limited by habitus LE 3+ edema pitting to hips. Difficult to tell if she has sacral edema or fluid wave. Ammonia 65, potassium initially low (replaced in ED) magnesium  1.6.  IMPRESSION: 1. Hepatic cirrhosis with portal hypertension including mild splenomegaly, mild ascites, and substantial retroperitoneal collateral vessels including uphill paraesophageal varices. Splenorenal shunting is suspected. 2. Mild cardiomegaly. 3. Cholelithiasis.  A/P: 1. Hepatic encephalopathy secondary to cirrhosis from NASH (prior dx of cirrhosis etiology) 2. Volume overloaded, likely hepatorenal syndrome   Difficult situation. At last admission we had sevveral conversations about end of life care. Unfortunately, I am not sure there is a lot we can do to change her trajectory. Will diurese within limits of her kidney function

## 2023-12-09 NOTE — Assessment & Plan Note (Addendum)
 Generally edematous on exam suspect due to hypoalbuminemia in the setting of on going liver cirrhosis.  Home medication includes hydrochlorothiazide  however given recent lab consistent with AKI we will give one-time dose of Lasix  with close monitoring of kidney function. - Ordered IV Lasix  40 mg -Closely monitor renal function with BMP

## 2023-12-09 NOTE — Assessment & Plan Note (Signed)
 Unclear cause of patient's hypokalemia at this time.  On admission was 2.2 and EKG generally unremarkable.  So far patient has received 30 mEq K chloride IV.  Will order another 30 mEq and recheck BMP in the morning -S/p KCl 30 mEq IV - Reordered second dose of KCl 30 mEq IV - Recheck BMP in the a.m. - Currently on cardiac monitoring

## 2023-12-10 ENCOUNTER — Inpatient Hospital Stay (HOSPITAL_COMMUNITY)

## 2023-12-10 ENCOUNTER — Encounter (HOSPITAL_COMMUNITY): Payer: Self-pay | Admitting: Family Medicine

## 2023-12-10 ENCOUNTER — Other Ambulatory Visit: Payer: Self-pay

## 2023-12-10 DIAGNOSIS — N179 Acute kidney failure, unspecified: Secondary | ICD-10-CM

## 2023-12-10 DIAGNOSIS — E876 Hypokalemia: Secondary | ICD-10-CM | POA: Diagnosis not present

## 2023-12-10 DIAGNOSIS — R4182 Altered mental status, unspecified: Secondary | ICD-10-CM | POA: Diagnosis not present

## 2023-12-10 HISTORY — DX: Hypomagnesemia: E83.42

## 2023-12-10 LAB — COMPREHENSIVE METABOLIC PANEL WITH GFR
ALT: 33 U/L (ref 0–44)
AST: 89 U/L — ABNORMAL HIGH (ref 15–41)
Albumin: 1.5 g/dL — ABNORMAL LOW (ref 3.5–5.0)
Alkaline Phosphatase: 74 U/L (ref 38–126)
Anion gap: 10 (ref 5–15)
BUN: 11 mg/dL (ref 8–23)
CO2: 30 mmol/L (ref 22–32)
Calcium: 8.3 mg/dL — ABNORMAL LOW (ref 8.9–10.3)
Chloride: 98 mmol/L (ref 98–111)
Creatinine, Ser: 1.21 mg/dL — ABNORMAL HIGH (ref 0.44–1.00)
GFR, Estimated: 49 mL/min — ABNORMAL LOW (ref >60)
Glucose, Bld: 91 mg/dL (ref 70–99)
Potassium: 2.2 mmol/L — CL (ref 3.5–5.1)
Sodium: 138 mmol/L (ref 135–145)
Total Bilirubin: 3.8 mg/dL — ABNORMAL HIGH (ref 0.0–1.2)
Total Protein: 6.6 g/dL (ref 6.5–8.1)

## 2023-12-10 LAB — APTT: aPTT: 34 s (ref 24–36)

## 2023-12-10 LAB — TSH: TSH: 1.915 u[IU]/mL (ref 0.350–4.500)

## 2023-12-10 LAB — BASIC METABOLIC PANEL WITH GFR
Anion gap: 14 (ref 5–15)
BUN: 11 mg/dL (ref 8–23)
CO2: 30 mmol/L (ref 22–32)
Calcium: 8.6 mg/dL — ABNORMAL LOW (ref 8.9–10.3)
Chloride: 96 mmol/L — ABNORMAL LOW (ref 98–111)
Creatinine, Ser: 1.29 mg/dL — ABNORMAL HIGH (ref 0.44–1.00)
GFR, Estimated: 45 mL/min — ABNORMAL LOW (ref >60)
Glucose, Bld: 91 mg/dL (ref 70–99)
Potassium: 2.6 mmol/L — CL (ref 3.5–5.1)
Sodium: 140 mmol/L (ref 135–145)

## 2023-12-10 LAB — CBC
HCT: 32.6 % — ABNORMAL LOW (ref 36.0–46.0)
Hemoglobin: 10.7 g/dL — ABNORMAL LOW (ref 12.0–15.0)
MCH: 33.8 pg (ref 26.0–34.0)
MCHC: 32.8 g/dL (ref 30.0–36.0)
MCV: 102.8 fL — ABNORMAL HIGH (ref 80.0–100.0)
Platelets: 77 10*3/uL — ABNORMAL LOW (ref 150–400)
RBC: 3.17 MIL/uL — ABNORMAL LOW (ref 3.87–5.11)
RDW: 16 % — ABNORMAL HIGH (ref 11.5–15.5)
WBC: 4.3 10*3/uL (ref 4.0–10.5)
nRBC: 0 % (ref 0.0–0.2)

## 2023-12-10 LAB — PROTIME-INR
INR: 2 — ABNORMAL HIGH (ref 0.8–1.2)
Prothrombin Time: 22.7 s — ABNORMAL HIGH (ref 11.4–15.2)

## 2023-12-10 LAB — HIV ANTIBODY (ROUTINE TESTING W REFLEX): HIV Screen 4th Generation wRfx: NONREACTIVE

## 2023-12-10 LAB — MAGNESIUM
Magnesium: 1.3 mg/dL — ABNORMAL LOW (ref 1.7–2.4)
Magnesium: 1.9 mg/dL (ref 1.7–2.4)

## 2023-12-10 LAB — LACTATE DEHYDROGENASE: LDH: 211 U/L — ABNORMAL HIGH (ref 98–192)

## 2023-12-10 MED ORDER — TORSEMIDE 20 MG PO TABS
20.0000 mg | ORAL_TABLET | Freq: Every day | ORAL | Status: DC
  Administered 2023-12-10: 20 mg via ORAL
  Filled 2023-12-10: qty 1

## 2023-12-10 MED ORDER — POTASSIUM CHLORIDE 10 MEQ/100ML IV SOLN
10.0000 meq | INTRAVENOUS | Status: AC
  Administered 2023-12-10 – 2023-12-11 (×5): 10 meq via INTRAVENOUS
  Filled 2023-12-10 (×6): qty 100

## 2023-12-10 MED ORDER — LACTULOSE 10 GM/15ML PO SOLN
20.0000 g | Freq: Three times a day (TID) | ORAL | Status: DC
  Administered 2023-12-10 – 2023-12-13 (×9): 20 g via ORAL
  Filled 2023-12-10 (×10): qty 30

## 2023-12-10 MED ORDER — MAGNESIUM SULFATE 2 GM/50ML IV SOLN
2.0000 g | Freq: Once | INTRAVENOUS | Status: AC
  Administered 2023-12-10: 2 g via INTRAVENOUS
  Filled 2023-12-10: qty 50

## 2023-12-10 MED ORDER — LEVOTHYROXINE SODIUM 25 MCG PO TABS
125.0000 ug | ORAL_TABLET | Freq: Every day | ORAL | Status: DC
  Administered 2023-12-10 – 2023-12-27 (×18): 125 ug via ORAL
  Filled 2023-12-10 (×18): qty 1

## 2023-12-10 MED ORDER — LACTULOSE ENEMA
300.0000 mL | Freq: Once | ORAL | Status: DC
  Filled 2023-12-10: qty 300

## 2023-12-10 MED ORDER — FUROSEMIDE 10 MG/ML IJ SOLN
40.0000 mg | Freq: Once | INTRAMUSCULAR | Status: AC
  Administered 2023-12-10: 40 mg via INTRAVENOUS
  Filled 2023-12-10: qty 4

## 2023-12-10 MED ORDER — POTASSIUM CHLORIDE 10 MEQ/100ML IV SOLN
10.0000 meq | INTRAVENOUS | Status: AC
  Administered 2023-12-10 (×6): 10 meq via INTRAVENOUS
  Filled 2023-12-10 (×6): qty 100

## 2023-12-10 MED ORDER — MAGNESIUM SULFATE 4 GM/100ML IV SOLN
4.0000 g | Freq: Once | INTRAVENOUS | Status: AC
  Administered 2023-12-10: 4 g via INTRAVENOUS
  Filled 2023-12-10: qty 100

## 2023-12-10 MED ORDER — POTASSIUM CHLORIDE 20 MEQ PO PACK
40.0000 meq | PACK | Freq: Once | ORAL | Status: AC
  Administered 2023-12-10: 40 meq via ORAL
  Filled 2023-12-10: qty 2

## 2023-12-10 MED ORDER — POTASSIUM CHLORIDE 20 MEQ PO PACK
60.0000 meq | PACK | Freq: Once | ORAL | Status: AC
  Administered 2023-12-10: 60 meq via ORAL
  Filled 2023-12-10: qty 3

## 2023-12-10 NOTE — ED Notes (Signed)
 Patient transported to MRI

## 2023-12-10 NOTE — ED Notes (Signed)
 MD messaged regarding oral lactulose  admin at 1000 since patient failed swallow eval with RN.

## 2023-12-10 NOTE — Assessment & Plan Note (Addendum)
 Stable, continue to monitor.  PT/INR elevated however likely multifactorial in setting of liver cirrhosis. - Continue close monitoring with daily CBC - Hold anticoagulants for VTE ppx with <100k PLT

## 2023-12-10 NOTE — Plan of Care (Signed)

## 2023-12-10 NOTE — ED Notes (Signed)
 Awaiting lactulose  enema from main pharmacy

## 2023-12-10 NOTE — ED Notes (Signed)
 Speech at bedside

## 2023-12-10 NOTE — Assessment & Plan Note (Addendum)
 Stable continue plan as below. -Avoid nephrotoxic agent -Obtain BMP lab in the AM

## 2023-12-10 NOTE — ED Notes (Signed)
MD notified of failed swallow screen 

## 2023-12-10 NOTE — Assessment & Plan Note (Addendum)
 Continues to be generally edematous but is not having any increased work of breathing or pain.   - Restart home torsemide 20 mg

## 2023-12-10 NOTE — ED Notes (Signed)
 Patient changed into new brief and linen changed. Peri care performed.

## 2023-12-10 NOTE — Assessment & Plan Note (Signed)
 Creatinine on admission is slightly elevated at 1.27. Suspect component of hepatorenal syndrome  Received 500 cc LR bolus in the ED -Avoid nephrotoxic agent -Obtain BMP lab in the AM

## 2023-12-10 NOTE — ED Notes (Signed)
 MD notified about passing speech eval. This RN did message MD regarding conversion to oral lactulose  with passed swallow eval.

## 2023-12-10 NOTE — Progress Notes (Signed)
   12/10/23 1626  Provider Notification  Provider Name/Title Violetta Grice, MD  Date Provider Notified 12/10/23  Time Provider Notified 1625  Method of Notification Page (epic)  Notification Reason Critical Result (K+ 2.6)  Test performed and critical result K+ 2.6  Date Critical Result Received 12/10/23  Time Critical Result Received 1605  Provider response See new orders  Date of Provider Response 12/10/23  Time of Provider Response 1626

## 2023-12-10 NOTE — Assessment & Plan Note (Addendum)
 CT head and MRI brain yesterday w/o evidence of intracranial process. CT AP showed mild splenomegaly as well as known changes related to her NASH cirrhosis. AMS is improving with treatment. Could consider abx treatment for increased ammonia clearance and SBP prophylaxis if patient does not improve with lactulose  alone -Continue lactulose , goal BM x3 a day - avoid CNS acting medications - s/p  LR 100 cc/hr g - repeat BMP - trend lactate - Fall precautions - SCDs for VTE prophylaxis, PLT less than 100,000 -Place purewick for urinary incontinence

## 2023-12-10 NOTE — Progress Notes (Signed)
 Daily Progress Note Intern Pager: [phone]  Patient name: Brianna Wheeler Medical record number: [id number] Date of birth: 04/21/1956 Age: 68 y.o. Gender: female  Primary Care Provider: Kandis Ormond, DO Consultants: None Code Status: DNR  Pt Overview and Major Events to Date:  5/19: Admitted  Assessment and Plan:  This is a 68 yo patient presenting with AMS 2/2 NASH cirrhosis. She has a PMH of T2DM, hypertension, hypothyroid, HLD, portal venous thrombosis urinary retention. Assessment & Plan AMS (altered mental status) CT head and MRI brain yesterday w/o evidence of intracranial process. CT AP showed mild splenomegaly as well as known changes related to her NASH cirrhosis. AMS is improving with treatment. Could consider abx treatment for increased ammonia clearance and SBP prophylaxis if patient does not improve with lactulose  alone -Continue lactulose , goal BM x3 a day - avoid CNS acting medications - s/p  LR 100 cc/hr g - repeat BMP - trend lactate - Fall precautions - SCDs for VTE prophylaxis, PLT less than 100,000 -Place purewick for urinary incontinence Cirrhosis of liver without ascites (HCC)  Thrombocytopenia Stable vitals, afebrile. Continue to monitor. - Restarted home lactulose  - Fall precautions - Monitor routine vitals, fever curve - Rx one-time dose of Lasix  for volume overload Hypokalemia K continues to be low, will continue to replete.  -S/p KCl 30 mEq IV - Reordered KCl 30 mEq IV -Also ordered oral potassium 40 mEq -4 g IV magnesium  repletion - Recheck BMP at 1600 and in the a.m. - Currently on cardiac monitoring Anasarca Continues to be generally edematous but is not having any increased work of breathing or pain.   - Restart home torsemide 20 mg Pancytopenia (HCC) Stable, continue to monitor.  PT/INR elevated however likely multifactorial in setting of liver cirrhosis. - Continue close monitoring with daily CBC - Hold anticoagulants  for VTE ppx with <100k PLT  AKI (acute kidney injury) (HCC) Stable continue plan as below. -Avoid nephrotoxic agent -Obtain BMP lab in the AM Chronic health problem DMII: stable, continue CBG twice daily HTN: hypotensive, holding home BP meds Hypothyroidism: will restart home synthroid  with improved mental status HLD: restart home statin at discharge Pain: Hold home tramadol , Ordered x1 fentanyl   Vit D deficiency: continue to supplement, will need outpatient Vit D level checked . Portal venous thrombus: Found on admission, stable.  Patient GI follow-up. Urinary retention: Foley placed from 4/19 - 4/23.  Now voiding spontaneously.  To restart  Flomax  0.4 mg daily.  FEN/GI: NPO pending bedside swallow PPx: SCDs Dispo:Pending PT recommendations  pending clinical improvement .   Subjective:  Mentation improved today able to answer questions appropriately and oriented to self and situation but not appropriately to time or place.  Objective: Temp:  [98.4 F (36.9 C)-99.2 F (37.3 C)] 98.4 F (36.9 C) (05/20 0426) Pulse Rate:  [88-95] 90 (05/20 0430) Resp:  [13-19] 15 (05/20 0430) BP: (82-141)/(44-104) 116/64 (05/20 0430) SpO2:  [99 %-100 %] 100 % (05/20 0430) Weight:  [366 kg] 139 kg (05/19 1502) Physical Exam: General: Ill-appearing elderly female, no distress Cardiovascular: RRR, no M/R/G Respiratory: CTAB, no increased work of breathing Abdomen: Edematous, [place] ascitic.  No tenderness or masses Extremities: 4+ pitting edema of all extremities  Laboratory: Most recent CBC Lab Results  Component Value Date   WBC 3.7 (L) 12/09/2023   HGB 11.3 (L) 12/09/2023   HCT 34.6 (L) 12/09/2023   MCV 102.4 (H) 12/09/2023   PLT 69 (L) 12/09/2023   Most  recent BMP    Latest Ref Rng & Units 12/10/2023    6:09 AM  BMP  Glucose 70 - 99 mg/dL 91   BUN 8 - 23 mg/dL 11   Creatinine 1.61 - 1.00 mg/dL 0.96   Sodium 045 - 409 mmol/L 138   Potassium 3.5 - 5.1 mmol/L 2.2   Chloride 98 -  111 mmol/L 98   CO2 22 - 32 mmol/L 30   Calcium  8.9 - 10.3 mg/dL 8.3     Rayma Calandra, DO 12/10/2023, 7:24 AM  PGY-1, [facility] FPTS Intern pager: [phone], text pages welcome Secure chat group [facility] Teaching Service

## 2023-12-10 NOTE — Assessment & Plan Note (Addendum)
 K continues to be low, will continue to replete.  -S/p KCl 30 mEq IV - Reordered KCl 30 mEq IV -Also ordered oral potassium 40 mEq -4 g IV magnesium  repletion - Recheck BMP at 1600 and in the a.m. - Currently on cardiac monitoring

## 2023-12-10 NOTE — Plan of Care (Signed)
 Arrived at bedside to reassess patient this morning.  On arrival patient was laying down comfortably in bed and awake.  Appears to be more coherent although still showing signs of confusion.  Oriented to person and place but not to year and president.  She stated that [name] briefly visited her last night. Patient able to express that her potassium IV around was burning other than that denies any other pain.  Head CT and brain MRI were all negative for any intracranial processes.  Received 40 mg of IV Lasix  overnight with good urine output per nurse.  Has also received lactulose  but no bowel movement yet. Morning labs are still pending.   Vitals:   12/10/23 0426 12/10/23 0430  BP:  116/64  Pulse:  90  Resp:  15  Temp: 98.4 F (36.9 C)   SpO2:  100%     Goble Last, MD PGY-3, [facility] Resident  Please page [phone] with questions.

## 2023-12-10 NOTE — Evaluation (Signed)
 Clinical/Bedside Swallow Evaluation Patient Details  Name: Brianna Wheeler MRN: [id number] Date of Birth: 05-11-1956  Today's Date: 12/10/2023 Time: SLP Start Time (ACUTE ONLY): 0910 SLP Stop Time (ACUTE ONLY): 2725 SLP Time Calculation (min) (ACUTE ONLY): 17 min  Past Medical History:  Past Medical History:  Diagnosis Date   Diabetes mellitus    Hiatal hernia 09/19/2006   Qualifier: Diagnosis of   By: Winona Haw         Hyperlipidemia    Hypertension    Left foot pain 07/24/2003   Chronic injury -- multiple broken bones and torn ligaments after accident in garage   Past Surgical History:  Past Surgical History:  Procedure Laterality Date   BIOPSY OF SKIN SUBCUTANEOUS TISSUE AND/OR MUCOUS MEMBRANE  11/13/2023   Procedure: BIOPSY, SKIN, SUBCUTANEOUS TISSUE, OR MUCOUS MEMBRANE;  Surgeon: Sergio Dandy, MD;  Location: [facility] ENDOSCOPY;  Service: Gastroenterology;;   ESOPHAGOGASTRODUODENOSCOPY N/A 11/13/2023   Procedure: EGD (ESOPHAGOGASTRODUODENOSCOPY);  Surgeon: Nandigam, Kavitha V, MD;  Location: [facility] ENDOSCOPY;  Service: Gastroenterology;  Laterality: N/A;   EYE SURGERY     LUMBAR DISC SURGERY     TUBAL LIGATION     HPI:  68 year old female admitted with AMS from SNF. MRI of the head negative. Dx with Hepatic cirrhosis with portal hypertension including mild splenomegaly, mild ascites, and substantial retroperitoneal  collateral vessels including uphill paraesophageal varices, Mild cardiomegaly, Cholelithiasis.    Assessment / Plan / Recommendation  Clinical Impression  Pt is alert and communicative this morning, oriented to person, place.  She participated in a clinical swallowing evaluation, demonstrating largely normal oropharyngeal swallow function. Oral mechanism exam WFL. Dentition present, poor condition. No focal CN deficits. Demonstrated normal mastication, the appearance of a brisk swallow response. One episode of coughing noted post swallow when consuming multiple  consecutive boluses of thin liquid via straw. With cueing for small sips, no s/s of aspiration.  Resume a regular diet, thin liquids. Per RN at SNF, patient on a regular diet previously without difficulty. In light of minimal coughing noted today will f/u briefly for diet tolerance. SLP Visit Diagnosis: Dysphagia, unspecified (R13.10)    Aspiration Risk       Diet Recommendation Regular;Thin liquid    Liquid Administration via: Cup;Straw Medication Administration: Whole meds with liquid Supervision: Staff to assist with self feeding Compensations: Slow rate;Small sips/bites Postural Changes: Seated upright at 90 degrees    Other  Recommendations Oral Care Recommendations: Oral care BID    Recommendations for follow up therapy are one component of a multi-disciplinary discharge planning process, led by the attending physician.  Recommendations may be updated based on patient status, additional functional criteria and insurance authorization.  Follow up Recommendations No SLP follow up         Functional Status Assessment Patient has had a recent decline in their functional status and demonstrates the ability to make significant improvements in function in a reasonable and predictable amount of time.  Frequency and Duration min 1 x/week  1 week        Swallow Study   General HPI: 68 year old female admitted with AMS from SNF. MRI of the head negative. Dx with Hepatic cirrhosis with portal hypertension including mild splenomegaly, mild ascites, and substantial retroperitoneal  collateral vessels including uphill paraesophageal varices, Mild cardiomegaly, Cholelithiasis. Type of Study: Bedside Swallow Evaluation Previous Swallow Assessment: 10/2023 with normal swallowing function Diet Prior to this Study: NPO Temperature Spikes Noted: No Respiratory Status: Room air History  of Recent Intubation: No Behavior/Cognition: Alert;Pleasant mood;Cooperative Oral Cavity Assessment: Dry Oral  Care Completed by SLP: No Oral Cavity - Dentition: Poor condition Vision: Functional for self-feeding Self-Feeding Abilities: Needs assist Patient Positioning: Upright in bed Baseline Vocal Quality: Normal Volitional Cough: Strong Volitional Swallow: Able to elicit    Oral/Motor/Sensory Function Overall Oral Motor/Sensory Function: Within functional limits   Ice Chips Ice chips: Not tested   Thin Liquid Thin Liquid: Impaired Presentation: Straw Pharyngeal  Phase Impairments: Cough - Immediate    Nectar Thick Nectar Thick Liquid: Not tested   Honey Thick Honey Thick Liquid: Not tested   Puree Puree: Within functional limits Presentation: Spoon   Solid     Solid: Within functional limits     Brianna Otte MA, CCC-SLP  Brianna Wheeler Brianna Wheeler 12/10/2023,9:43 AM

## 2023-12-10 NOTE — Assessment & Plan Note (Signed)
 DMII: stable, continue CBG twice daily HTN: hypotensive, holding home BP meds Hypothyroidism: will restart home synthroid  with improved mental status HLD: restart home statin at discharge Pain: Hold home tramadol , Ordered x1 fentanyl   Vit D deficiency: continue to supplement, will need outpatient Vit D level checked . Portal venous thrombus: Found on admission, stable.  Patient GI follow-up. Urinary retention: Foley placed from 4/19 - 4/23.  Now voiding spontaneously.  To restart  Flomax  0.4 mg daily.

## 2023-12-10 NOTE — Assessment & Plan Note (Signed)
 Stable vitals, afebrile. Continue to monitor. - Restarted home lactulose  - Fall precautions - Monitor routine vitals, fever curve - Rx one-time dose of Lasix  for volume overload

## 2023-12-11 DIAGNOSIS — E876 Hypokalemia: Secondary | ICD-10-CM | POA: Diagnosis not present

## 2023-12-11 DIAGNOSIS — R4182 Altered mental status, unspecified: Secondary | ICD-10-CM | POA: Diagnosis not present

## 2023-12-11 LAB — MAGNESIUM
Magnesium: 2.1 mg/dL (ref 1.7–2.4)
Magnesium: 2 mg/dL (ref 1.7–2.4)

## 2023-12-11 LAB — BASIC METABOLIC PANEL WITH GFR
Anion gap: 7 (ref 5–15)
Anion gap: 8 (ref 5–15)
BUN: 12 mg/dL (ref 8–23)
BUN: 12 mg/dL (ref 8–23)
CO2: 30 mmol/L (ref 22–32)
CO2: 29 mmol/L (ref 22–32)
Calcium: 8.1 mg/dL — ABNORMAL LOW (ref 8.9–10.3)
Calcium: 8 mg/dL — ABNORMAL LOW (ref 8.9–10.3)
Chloride: 99 mmol/L (ref 98–111)
Chloride: 100 mmol/L (ref 98–111)
Creatinine, Ser: 1.17 mg/dL — ABNORMAL HIGH (ref 0.44–1.00)
Creatinine, Ser: 1.2 mg/dL — ABNORMAL HIGH (ref 0.44–1.00)
GFR, Estimated: 51 mL/min — ABNORMAL LOW (ref >60)
GFR, Estimated: 49 mL/min — ABNORMAL LOW (ref >60)
Glucose, Bld: 100 mg/dL — ABNORMAL HIGH (ref 70–99)
Glucose, Bld: 117 mg/dL — ABNORMAL HIGH (ref 70–99)
Potassium: 3.4 mmol/L — ABNORMAL LOW (ref 3.5–5.1)
Potassium: 3.2 mmol/L — ABNORMAL LOW (ref 3.5–5.1)
Sodium: 136 mmol/L (ref 135–145)
Sodium: 137 mmol/L (ref 135–145)

## 2023-12-11 LAB — CBC
HCT: 31.2 % — ABNORMAL LOW (ref 36.0–46.0)
Hemoglobin: 10.3 g/dL — ABNORMAL LOW (ref 12.0–15.0)
MCH: 33.4 pg (ref 26.0–34.0)
MCHC: 33 g/dL (ref 30.0–36.0)
MCV: 101.3 fL — ABNORMAL HIGH (ref 80.0–100.0)
Platelets: 66 10*3/uL — ABNORMAL LOW (ref 150–400)
RBC: 3.08 MIL/uL — ABNORMAL LOW (ref 3.87–5.11)
RDW: 16 % — ABNORMAL HIGH (ref 11.5–15.5)
WBC: 3.2 10*3/uL — ABNORMAL LOW (ref 4.0–10.5)
nRBC: 0 % (ref 0.0–0.2)

## 2023-12-11 MED ORDER — TORSEMIDE 20 MG PO TABS
10.0000 mg | ORAL_TABLET | Freq: Every day | ORAL | Status: DC
  Administered 2023-12-11 – 2023-12-26 (×16): 10 mg via ORAL
  Filled 2023-12-11 (×18): qty 1

## 2023-12-11 MED ORDER — FENTANYL CITRATE PF 50 MCG/ML IJ SOSY: 25.0000 ug | PREFILLED_SYRINGE | Freq: Once | INTRAMUSCULAR | Status: DC

## 2023-12-11 MED ORDER — POTASSIUM CHLORIDE CRYS ER 20 MEQ PO TBCR
40.0000 meq | EXTENDED_RELEASE_TABLET | Freq: Once | ORAL | Status: AC
  Administered 2023-12-11: 40 meq via ORAL
  Filled 2023-12-11: qty 2

## 2023-12-11 MED ORDER — SPIRONOLACTONE 25 MG PO TABS
25.0000 mg | ORAL_TABLET | Freq: Every day | ORAL | Status: DC
  Administered 2023-12-11: 25 mg via ORAL
  Filled 2023-12-11: qty 1

## 2023-12-11 MED ORDER — POTASSIUM CHLORIDE 10 MEQ/100ML IV SOLN
10.0000 meq | INTRAVENOUS | Status: AC
  Administered 2023-12-11 (×2): 10 meq via INTRAVENOUS
  Filled 2023-12-11 (×2): qty 100

## 2023-12-11 NOTE — Assessment & Plan Note (Addendum)
 Patient with bilateral leg pain this morning, resolved with position change.  Low threshold to ultrasound given increased clotting risk, however clotting less likely given pain is bilateral and patient has low platelets and elevated PT/INR. - reduce home torsemide to 10 mg

## 2023-12-11 NOTE — Assessment & Plan Note (Addendum)
 AMS continues to improve with treatment.  Will continue to monitor and correct electrolyte abnormalities as appropriate.  With 1 documented stool yesterday, improved from prior no documented stools. -Continue lactulose , goal BM x3 a day - avoid CNS acting medications - s/p  LR 100 cc/hr g - repeat BMP - trend lactate - Fall precautions - SCDs for VTE prophylaxis, PLT less than 100,000 -Place purewick for urinary incontinence -palliative care consult to discuss GOC/medical decision making

## 2023-12-11 NOTE — Assessment & Plan Note (Signed)
 White cell count improving, all other cell lines stable.  Continue to monitor - Continue close monitoring with daily CBC - Hold anticoagulants for VTE ppx with <100k PLT

## 2023-12-11 NOTE — Assessment & Plan Note (Signed)
 DMII: stable, continue CBG twice daily HTN: hypotensive, holding home BP meds Hypothyroidism: will restart home synthroid  with improved mental status HLD: restart home statin at discharge Pain: Hold home tramadol , Ordered x1 fentanyl   Vit D deficiency: continue to supplement, will need outpatient Vit D level checked . Portal venous thrombus: Found on admission, stable.  Patient GI follow-up. Urinary retention: Foley placed from 4/19 - 4/23.  Now voiding spontaneously.  To restart  Flomax  0.4 mg daily.

## 2023-12-11 NOTE — Progress Notes (Signed)
 Daily Progress Note Intern Pager: [phone]  Patient name: Brianna Wheeler Medical record number: [id number] Date of birth: [birth year] Age: 68 y.o. Gender: female  Primary Care Provider: Kandis Ormond, DO Consultants: none Code Status: DNR  Pt Overview and Major Events to Date:  5/19: Admitted  Assessment and Plan:  This is a 68 year old patient presenting with altered mental status secondary # cirrhosis decompensation.  She has a past medical history of T2DM, HTN, hypothyroidism, HLD, portal venous thrombosis and urinary retention.  Her hypokalemia is improving as is her mental status at this time. Assessment & Plan AMS (altered mental status) AMS continues to improve with treatment.  Will continue to monitor and correct electrolyte abnormalities as appropriate.  With 1 documented stool yesterday, improved from prior no documented stools. -Continue lactulose , goal BM x3 a day - avoid CNS acting medications - s/p  LR 100 cc/hr g - repeat BMP - trend lactate - Fall precautions - SCDs for VTE prophylaxis, PLT less than 100,000 -Place purewick for urinary incontinence -palliative care consult to discuss GOC/medical decision making Cirrhosis of liver without ascites (HCC)  Thrombocytopenia Afebrile, stable vitals.  Stable platelet count.  CTM - Restarted home lactulose  - Fall precautions - Monitor routine vitals, fever curve Hypokalemia K today is 3.4 marked improvement from yesterday.  Patient still receiving IV potassium ordered yesterday will repeat level this noon and replete as necessary. - Replete potassium with goal of 4.0 or greater - 2 PM repeat BMP and magnesium  - start aldactone 25 mg today -AM BMP and mag Anasarca Patient with bilateral leg pain this morning, resolved with position change.  Low threshold to ultrasound given increased clotting risk, however clotting less likely given pain is bilateral and patient has low platelets and elevated PT/INR. -  reduce home torsemide to 10 mg Pancytopenia (HCC) White cell count improving, all other cell lines stable.  Continue to monitor - Continue close monitoring with daily CBC - Hold anticoagulants for VTE ppx with <100k PLT  AKI (acute kidney injury) (HCC) Improving. -Avoid nephrotoxic agent - Daily BMP Chronic health problem DMII: stable, continue CBG twice daily HTN: hypotensive, holding home BP meds Hypothyroidism: will restart home synthroid  with improved mental status HLD: restart home statin at discharge Pain: Hold home tramadol , Ordered x1 fentanyl   Vit D deficiency: continue to supplement, will need outpatient Vit D level checked . Portal venous thrombus: Found on admission, stable.  Patient GI follow-up. Urinary retention: Foley placed from 4/19 - 4/23.  Now voiding spontaneously.  To restart  Flomax  0.4 mg daily.  FEN/GI: Regular diet PPx: SCDs Dispo:SNF pending clinical improvement .   Subjective:  Far more conversational this morning is able to recall overnight events.  She is having trouble locating certain items such as her phone and call bell however this may be due to her limited vision and not her mental status.  Objective: Temp:  [97.9 F (36.6 C)-98.6 F (37 C)] 98.2 F (36.8 C) (05/21 0525) Pulse Rate:  [79-115] 79 (05/21 0525) Resp:  [14-20] 20 (05/21 0525) BP: (99-132)/(45-67) 115/52 (05/21 0525) SpO2:  [95 %-100 %] 97 % (05/21 0525) Weight:  [137.8 kg-140 kg] 140 kg (05/21 0000) Physical Exam: General: Ill-appearing elderly female, no distress Cardiovascular: RRR, no M/R/G Respiratory: CTAB, no increased work of breathing Abdomen: Soft, nontender.  Edema noted across abdomen.  Notable fluid wave consistent with significant ascites. Extremities: 4+ pitting edema in the bilateral lower extremities.  Warm, well-perfused.  Laboratory:  Most recent CBC Lab Results  Component Value Date   WBC 4.3 12/10/2023   HGB 10.7 (L) 12/10/2023   HCT 32.6 (L)  12/10/2023   MCV 102.8 (H) 12/10/2023   PLT 77 (L) 12/10/2023   Most recent BMP    Latest Ref Rng & Units 12/11/2023    2:37 AM  BMP  Glucose 70 - 99 mg/dL 027   BUN 8 - 23 mg/dL 12   Creatinine 2.53 - 1.00 mg/dL 6.64   Sodium 403 - 474 mmol/L 136   Potassium 3.5 - 5.1 mmol/L 3.4   Chloride 98 - 111 mmol/L 99   CO2 22 - 32 mmol/L 30   Calcium  8.9 - 10.3 mg/dL 8.1     Brianna Calandra, DO 12/11/2023, 7:14 AM  PGY-1, [facility] FPTS Intern pager: [phone], text pages welcome Secure chat group [facility] Teaching Service

## 2023-12-11 NOTE — Assessment & Plan Note (Signed)
 Afebrile, stable vitals.  Stable platelet count.  CTM - Restarted home lactulose  - Fall precautions - Monitor routine vitals, fever curve

## 2023-12-11 NOTE — Plan of Care (Signed)
  Problem: Education: Goal: Knowledge of General Education information will improve Description: Including pain rating scale, medication(s)/side effects and non-pharmacologic comfort measures Outcome: Progressing   Problem: Clinical Measurements: Goal: Cardiovascular complication will be avoided Outcome: Progressing   Problem: Activity: Goal: Risk for activity intolerance will decrease Outcome: Not Progressing   Problem: Pain Managment: Goal: General experience of comfort will improve and/or be controlled Outcome: Progressing   Problem: Safety: Goal: Ability to remain free from injury will improve Outcome: Progressing

## 2023-12-11 NOTE — Assessment & Plan Note (Addendum)
 K today is 3.4 marked improvement from yesterday.  Patient still receiving IV potassium ordered yesterday will repeat level this noon and replete as necessary. - Replete potassium with goal of 4.0 or greater - 2 PM repeat BMP and magnesium  - start aldactone 25 mg today -AM BMP and mag

## 2023-12-11 NOTE — TOC Initial Note (Signed)
 Transition of Care [facility]) - Initial/Assessment Note    Patient Details  Name: Brianna Wheeler MRN: [id number] Date of Birth: 07-Feb-1956  Transition of Care [facility]) CM/SW Contact:    Arron Big, LCSWA Phone Number: 12/11/2023, 4:19 PM  Clinical Narrative:   Patient is from [facility] Was there for 14 days, has 6 remaining days of coverage at 100%. On the 21st day, copay will be $203. CSW will await PT/OT recs before proceeding with next steps in discharge planning. If plan for SNF, will need to submit for pre auth.   TOC will continue to follow.            Expected Discharge Plan: Skilled Nursing Facility Barriers to Discharge: Continued Medical Work up   Patient Goals and CMS Choice Patient states their goals for this hospitalization and ongoing recovery are:: SNF          Expected Discharge Plan and Services In-house Referral: Clinical Social Work Discharge Planning Services: CM Consult   Living arrangements for the past 2 months: Single Family Home                                      Prior Living Arrangements/Services Living arrangements for the past 2 months: Single Family Home Lives with:: Adult Children Patient language and need for interpreter reviewed:: Yes Do you feel safe going back to the place where you live?: Yes      Need for Family Participation in Patient Care: No (Comment) Care giver support system in place?: No (comment)   Criminal Activity/Legal Involvement Pertinent to Current Situation/Hospitalization: No - Comment as needed  Activities of Daily Living   ADL Screening (condition at time of admission) Independently performs ADLs?: No Does the patient have a NEW difficulty with bathing/dressing/toileting/self-feeding that is expected to last >3 days?: No Does the patient have a NEW difficulty with getting in/out of bed, walking, or climbing stairs that is expected to last >3 days?: No Does the patient have a NEW difficulty with  communication that is expected to last >3 days?: No Is the patient deaf or have difficulty hearing?: No Does the patient have difficulty seeing, even when wearing glasses/contacts?: Yes Does the patient have difficulty concentrating, remembering, or making decisions?: No  Permission Sought/Granted Permission sought to share information with : Facility Medical sales representative, Case Estate manager/land agent granted to share information with : Yes, Verbal Permission Granted     Permission granted to share info w AGENCY: [facility]        Emotional Assessment Appearance:: Appears stated age Attitude/Demeanor/Rapport: Engaged Affect (typically observed): Flat Orientation: : Oriented to  Time, Oriented to Place, Oriented to Self Alcohol / Substance Use: Not Applicable Psych Involvement: No (comment)  Admission diagnosis:  Hypokalemia [E87.6] Hypomagnesemia [E83.42] Altered mental status, unspecified altered mental status type [R41.82] AMS (altered mental status) [R41.82] Patient Active Problem List   Diagnosis Date Noted   AKI (acute kidney injury) (HCC) 12/10/2023   Hypomagnesemia 12/10/2023   Hyperammonemia (HCC) 12/09/2023   AMS (altered mental status) 12/09/2023   Pancytopenia (HCC) 12/09/2023   Anemia 11/19/2023   Vaginal bleeding 11/16/2023   Vitamin D  deficiency 11/11/2023   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   High serum transferrin saturation 11/08/2023   Cirrhosis of liver without ascites (HCC)  Thrombocytopenia 11/07/2023   Chronic health problem 11/06/2023   Possible Portal vein/Splenic vein thrombosis on CT 11/06/2023   Anasarca 11/06/2023  Left eye Retinal Detachment 11/06/2023   Calcification of lens, Left 11/06/2023   Eosinophilia, unspecified 11/06/2023   Thrombocytopenia (HCC) 11/06/2023   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   Systolic murmur 11/06/2023   Right knee pain 04/29/2023   Tremor of both hands 11/02/2022   Headache 04/05/2022   Frequent falls 09/13/2021    Hypokalemia 07/28/2021   Morbid obesity (HCC) 12/08/2020   Adjustment reaction with mixed emotional features 09/12/2020   Serum gammaglobulin increased 02/11/2019   Chronic venous insufficiency 01/28/2019   Nocturia 01/28/2019   GERD (gastroesophageal reflux disease) 05/01/2016   Vertebral compression fracture (HCC) 12/09/2015   Metatarsalgia of both feet 09/20/2015   Allergic rhinitis 08/30/2015   Transaminitis 04/29/2015   Cataract 01/01/2013   Back pain 06/10/2012   Colon cancer screening 02/11/2012   Asthma 12/07/2011   Depression 05/10/2011   Chronic sinusitis 09/20/2010   Osteoarthritis 04/07/2009   Hypothyroidism 03/03/2008   Lumbar back pain with radiculopathy affecting right lower extremity 10/03/2007   Prediabetes 05/08/2007   HLD (hyperlipidemia) 09/19/2006   HYPERTENSION, BENIGN SYSTEMIC 09/19/2006   PCP:  Kandis Ormond, DO Pharmacy:   [facility] [address] [place] - [address] [place] [address] Phone: [phone] Fax: [phone]  [place] [facility] [address] [place] [address] Phone: [phone] Fax: [phone]  [facility] [place],  - [place] [address] [place] [address] Phone: [phone] Fax: [phone]     Social Drivers of Health (SDOH) Social History: SDOH Screenings   Food Insecurity: No Food Insecurity (12/10/2023)  Housing: Low Risk  (12/10/2023)  Transportation Needs: No Transportation Needs (12/10/2023)  Utilities: Not At Risk (12/10/2023)  Alcohol Screen: Low Risk  (09/30/2023)  Depression (PHQ2-9): Low Risk  (09/30/2023)  Financial Resource Strain: Low Risk  (09/30/2023)  Physical Activity: Insufficiently Active (09/30/2023)  Social Connections: Socially Isolated (12/10/2023)  Stress: No Stress Concern Present (09/30/2023)  Tobacco Use: Low Risk  (12/10/2023)  Health Literacy:  Adequate Health Literacy (09/30/2023)   SDOH Interventions:     Readmission Risk Interventions    11/25/2023    1:00 PM  Readmission Risk Prevention Plan  Transportation Screening Complete  Palliative Care Screening Not Applicable  Medication Review (RN Care Manager) Complete

## 2023-12-11 NOTE — Plan of Care (Signed)
   Problem: Education: Goal: Knowledge of General Education information will improve Description: Including pain rating scale, medication(s)/side effects and non-pharmacologic comfort measures Outcome: Progressing   Problem: Nutrition: Goal: Adequate nutrition will be maintained Outcome: Progressing   Problem: Coping: Goal: Level of anxiety will decrease Outcome: Progressing

## 2023-12-11 NOTE — Assessment & Plan Note (Addendum)
 Improving. -Avoid nephrotoxic agent - Daily BMP

## 2023-12-12 DIAGNOSIS — N179 Acute kidney failure, unspecified: Secondary | ICD-10-CM

## 2023-12-12 DIAGNOSIS — Z515 Encounter for palliative care: Secondary | ICD-10-CM | POA: Diagnosis not present

## 2023-12-12 DIAGNOSIS — K7581 Nonalcoholic steatohepatitis (NASH): Secondary | ICD-10-CM | POA: Diagnosis not present

## 2023-12-12 DIAGNOSIS — R410 Disorientation, unspecified: Secondary | ICD-10-CM | POA: Diagnosis not present

## 2023-12-12 DIAGNOSIS — K7460 Unspecified cirrhosis of liver: Secondary | ICD-10-CM

## 2023-12-12 DIAGNOSIS — E876 Hypokalemia: Secondary | ICD-10-CM | POA: Diagnosis not present

## 2023-12-12 DIAGNOSIS — E7220 Disorder of urea cycle metabolism, unspecified: Secondary | ICD-10-CM | POA: Diagnosis not present

## 2023-12-12 DIAGNOSIS — R601 Generalized edema: Secondary | ICD-10-CM

## 2023-12-12 LAB — BASIC METABOLIC PANEL WITH GFR
Anion gap: 8 (ref 5–15)
Anion gap: 5 (ref 5–15)
BUN: 12 mg/dL (ref 8–23)
BUN: 12 mg/dL (ref 8–23)
CO2: 30 mmol/L (ref 22–32)
CO2: 28 mmol/L (ref 22–32)
Calcium: 8.1 mg/dL — ABNORMAL LOW (ref 8.9–10.3)
Calcium: 7.8 mg/dL — ABNORMAL LOW (ref 8.9–10.3)
Chloride: 98 mmol/L (ref 98–111)
Chloride: 102 mmol/L (ref 98–111)
Creatinine, Ser: 1.25 mg/dL — ABNORMAL HIGH (ref 0.44–1.00)
Creatinine, Ser: 1.2 mg/dL — ABNORMAL HIGH (ref 0.44–1.00)
GFR, Estimated: 47 mL/min — ABNORMAL LOW (ref >60)
GFR, Estimated: 49 mL/min — ABNORMAL LOW (ref >60)
Glucose, Bld: 115 mg/dL — ABNORMAL HIGH (ref 70–99)
Glucose, Bld: 116 mg/dL — ABNORMAL HIGH (ref 70–99)
Potassium: 2.9 mmol/L — ABNORMAL LOW (ref 3.5–5.1)
Potassium: 3.8 mmol/L (ref 3.5–5.1)
Sodium: 136 mmol/L (ref 135–145)
Sodium: 135 mmol/L (ref 135–145)

## 2023-12-12 LAB — CBC
HCT: 33.1 % — ABNORMAL LOW (ref 36.0–46.0)
Hemoglobin: 11 g/dL — ABNORMAL LOW (ref 12.0–15.0)
MCH: 34.2 pg — ABNORMAL HIGH (ref 26.0–34.0)
MCHC: 33.2 g/dL (ref 30.0–36.0)
MCV: 102.8 fL — ABNORMAL HIGH (ref 80.0–100.0)
Platelets: 62 10*3/uL — ABNORMAL LOW (ref 150–400)
RBC: 3.22 MIL/uL — ABNORMAL LOW (ref 3.87–5.11)
RDW: 16 % — ABNORMAL HIGH (ref 11.5–15.5)
WBC: 3.3 10*3/uL — ABNORMAL LOW (ref 4.0–10.5)
nRBC: 0 % (ref 0.0–0.2)

## 2023-12-12 LAB — MAGNESIUM
Magnesium: 2 mg/dL (ref 1.7–2.4)
Magnesium: 1.9 mg/dL (ref 1.7–2.4)

## 2023-12-12 LAB — PROTIME-INR
INR: 2.1 — ABNORMAL HIGH (ref 0.8–1.2)
Prothrombin Time: 24.2 s — ABNORMAL HIGH (ref 11.4–15.2)

## 2023-12-12 LAB — HAPTOGLOBIN: Haptoglobin: <10 mg/dL — ABNORMAL LOW (ref 37–355)

## 2023-12-12 MED ORDER — PANTOPRAZOLE SODIUM 40 MG IV SOLR
40.0000 mg | Freq: Once | INTRAVENOUS | Status: AC
  Administered 2023-12-12: 40 mg via INTRAVENOUS
  Filled 2023-12-12: qty 10

## 2023-12-12 MED ORDER — POTASSIUM CHLORIDE CRYS ER 20 MEQ PO TBCR
40.0000 meq | EXTENDED_RELEASE_TABLET | Freq: Once | ORAL | Status: DC
Start: 2023-12-12 — End: 2023-12-12

## 2023-12-12 MED ORDER — SPIRONOLACTONE 25 MG PO TABS
50.0000 mg | ORAL_TABLET | Freq: Every day | ORAL | Status: DC
  Administered 2023-12-12 – 2023-12-21 (×10): 50 mg via ORAL
  Filled 2023-12-12 (×11): qty 2

## 2023-12-12 MED ORDER — POTASSIUM CHLORIDE CRYS ER 20 MEQ PO TBCR
40.0000 meq | EXTENDED_RELEASE_TABLET | Freq: Once | ORAL | Status: AC
  Administered 2023-12-12: 40 meq via ORAL
  Filled 2023-12-12: qty 2

## 2023-12-12 MED ORDER — POTASSIUM CHLORIDE CRYS ER 20 MEQ PO TBCR
20.0000 meq | EXTENDED_RELEASE_TABLET | Freq: Once | ORAL | Status: AC
  Administered 2023-12-12: 20 meq via ORAL
  Filled 2023-12-12: qty 1

## 2023-12-12 MED ORDER — ACETAMINOPHEN 500 MG PO TABS
500.0000 mg | ORAL_TABLET | Freq: Three times a day (TID) | ORAL | Status: DC | PRN
  Administered 2023-12-12 – 2023-12-21 (×5): 500 mg via ORAL
  Filled 2023-12-12 (×5): qty 1

## 2023-12-12 MED ORDER — POTASSIUM CHLORIDE CRYS ER 20 MEQ PO TBCR
40.0000 meq | EXTENDED_RELEASE_TABLET | Freq: Two times a day (BID) | ORAL | Status: DC
Start: 2023-12-12 — End: 2023-12-13
  Administered 2023-12-12 – 2023-12-13 (×3): 40 meq via ORAL
  Filled 2023-12-12 (×3): qty 2

## 2023-12-12 MED ORDER — MAGNESIUM SULFATE IN D5W 1-5 GM/100ML-% IV SOLN
1.0000 g | Freq: Once | INTRAVENOUS | Status: AC
  Administered 2023-12-12: 1 g via INTRAVENOUS
  Filled 2023-12-12: qty 100

## 2023-12-12 NOTE — Care Management Important Message (Signed)
 Important Message  Patient Details  Name: ANNESHA DELGRECO MRN: [id number] Date of Birth: 1956-03-09   Important Message Given:  Yes - Medicare IM     Wynonia Hedges 12/12/2023, 3:13 PM

## 2023-12-12 NOTE — Progress Notes (Addendum)
 Daily Progress Note Intern Pager: [phone]  Patient name: Brianna Wheeler Medical record number: [id number] Date of birth: Oct 06, 1955 Age: 68 y.o. Gender: female  Primary Care Provider: Kandis Ormond, DO Consultants: palliative Code Status: DNR  Pt Overview and Major Events to Date:  5/19: Admitted  Assessment and Plan:  This is a 68 year old female patient admitted for altered mental status secondary to [place] cirrhosis.  Her altered mental status has now resolved and we are continuing to correct electrolyte disturbances. Assessment & Plan AMS (altered mental status) Mental status is now at baseline with some waxing and waning and patient had 3 bowel movements yesterday which is at goal.  Palliative was consulted yesterday to discuss goals of care and medical decision making with the patient given her high potential for quick decline in the future. -Continue lactulose , goal BM x3 a day - avoid CNS acting medications - repeat BMP - trend lactate - Fall precautions - SCDs for VTE prophylaxis, PLT less than 100,000 -Place purewick for urinary incontinence -palliative care consult to discuss GOC/medical decision making Cirrhosis of liver without ascites (HCC)  Thrombocytopenia Afebrile, stable vitals.  Stable platelet count.  CTM - Continue home lactulose  - Fall precautions - Monitor routine vitals, fever curve Hypokalemia Potassium dropped to 2.9 overnight patient received 40 mEq with repeat BMP scheduled for noon today. - Scheduled potassium 40 mEq twice daily for 4 doses -One-time dose 20 mEq potassium -Recheck BMP and mag tonight at 1800. -Replete magnesium  with a goal of 2.0 or higher - Increase Aldactone to 50 mg today - AM BMP and mag Anasarca Stable, continue to monitor - Continue torsemide to 10 mg -Monitor for signs of pulmonary edema Pancytopenia (HCC) Stable, continue to monitor - Continue close monitoring with daily CBC - Hold anticoagulants for VTE  ppx with <100k PLT  AKI (acute kidney injury) (HCC) Improving.  Encourage p.o. hydration -Avoid nephrotoxic agent - Daily BMP - obtain urine Na, creatinine, urea nitrogen to further assess fluid status Chronic health problem DMII: stable, continue CBG twice daily HTN: hypotensive, holding home BP meds Hypothyroidism: will restart home synthroid  with improved mental status HLD: restart home statin at discharge Pain: Hold home tramadol , Ordered x1 fentanyl   Vit D deficiency: continue to supplement, will need outpatient Vit D level checked . Portal venous thrombus: Found on admission, stable.  Patient GI follow-up. Urinary retention: Foley placed from 4/19 - 4/23.  Now voiding spontaneously.  To restart  Flomax  0.4 mg daily.  FEN/GI: Regular PPx: SCDs Dispo:SNF pending clinical improvement .    Subjective:  Reports feeling tired today, able to answer questions appropriately, knows who she is aware she is, however ability to carry a conversation is waning compared to yesterday.  Patient is very redirectable but seems to jump from subject to subject within a sentence randomly.  Objective: Temp:  [98.1 F (36.7 C)-98.3 F (36.8 C)] 98.2 F (36.8 C) (05/22 0429) Pulse Rate:  [87-111] 94 (05/22 0429) Resp:  [17-20] 17 (05/22 0429) BP: (103-115)/(41-51) 110/51 (05/22 0429) SpO2:  [94 %-99 %] 99 % (05/22 0429) Weight:  [140.8 kg] 140.8 kg (05/22 0429) Physical Exam: General: Ill-appearing elderly female, no distress Cardiovascular: RRR, no M/R/G Respiratory: CTAB, no increased work of breathing Abdomen: Soft, nontender.  Edematous with evidence of ascites Extremities: 4+ pitting edema to the thigh  Laboratory: Most recent CBC Lab Results  Component Value Date   WBC 3.2 (L) 12/11/2023   HGB 10.3 (L) 12/11/2023  HCT 31.2 (L) 12/11/2023   MCV 101.3 (H) 12/11/2023   PLT 66 (L) 12/11/2023   Most recent BMP    Latest Ref Rng & Units 12/12/2023    3:02 AM  BMP  Glucose 70 - 99  mg/dL 409   BUN 8 - 23 mg/dL 12   Creatinine 8.11 - 1.00 mg/dL 9.14   Sodium 782 - 956 mmol/L 136   Potassium 3.5 - 5.1 mmol/L 2.9   Chloride 98 - 111 mmol/L 98   CO2 22 - 32 mmol/L 30   Calcium  8.9 - 10.3 mg/dL 8.1     Rayma Calandra, DO 12/12/2023, 7:38 AM  PGY-1, [facility] FPTS Intern pager: [phone], text pages welcome Secure chat group [facility] Teaching Service

## 2023-12-12 NOTE — Progress Notes (Signed)
 Speech Language Pathology Treatment: Dysphagia  Patient Details Name: Brianna Wheeler MRN: [id number] DOB: 1955-09-16 Today's Date: 12/12/2023 Time: 0800-0820 SLP Time Calculation (min) (ACUTE ONLY): 20 min  Assessment / Plan / Recommendation Clinical Impression  Patient seen for po tolerance assessment after initial bedside swallow evaluation. Patient alert, reports being tired, but able to participate. She is verbose, tangential today and with decreased thought organization. MD in room and made aware. She was able to consume clinician provided po trials of prescribed diet (regular solids, thin liquids) without overt s/s of aspiration, max assist for feeding. She did complain of solids being dry due to known xerostomia but was able to clear oral cavity with liquid wash, managing multiple consistencies in oral cavity adequately. No overt s/s of aspiration. Overall, current diet remains appropriate, will request extra gravy, etc to moisten solids. No further SLP needs indicated.    HPI HPI: 68 year old female admitted with AMS from SNF. MRI of the head negative. Dx with Hepatic cirrhosis with portal hypertension including mild splenomegaly, mild ascites, and substantial retroperitoneal  collateral vessels including uphill paraesophageal varices, Mild cardiomegaly, Cholelithiasis.      SLP Plan  All goals met      Recommendations for follow up therapy are one component of a multi-disciplinary discharge planning process, led by the attending physician.  Recommendations may be updated based on patient status, additional functional criteria and insurance authorization.    Recommendations  Diet recommendations: Regular;Thin liquid Liquids provided via: Cup;Straw Medication Administration: Whole meds with liquid Supervision: Staff to assist with self feeding Compensations: Slow rate;Small sips/bites Postural Changes and/or Swallow Maneuvers: Seated upright 90 degrees                 Oral  care BID   Frequent or constant Supervision/Assistance Dysphagia, unspecified (R13.10)     All goals met    Delsa Fife MA, CCC-SLP  Jatia Musa [name]  12/12/2023, 8:27 AM

## 2023-12-12 NOTE — Assessment & Plan Note (Addendum)
 Mental status is now at baseline with some waxing and waning and patient had 3 bowel movements yesterday which is at goal.  Palliative was consulted yesterday to discuss goals of care and medical decision making with the patient given her high potential for quick decline in the future. -Continue lactulose , goal BM x3 a day - avoid CNS acting medications - repeat BMP - trend lactate - Fall precautions - SCDs for VTE prophylaxis, PLT less than 100,000 -Place purewick for urinary incontinence -palliative care consult to discuss GOC/medical decision making

## 2023-12-12 NOTE — Assessment & Plan Note (Signed)
 Afebrile, stable vitals.  Stable platelet count.  CTM - Continue home lactulose  - Fall precautions - Monitor routine vitals, fever curve

## 2023-12-12 NOTE — Assessment & Plan Note (Addendum)
 Improving.  Encourage p.o. hydration -Avoid nephrotoxic agent - Daily BMP - obtain urine Na, creatinine, urea nitrogen to further assess fluid status

## 2023-12-12 NOTE — Assessment & Plan Note (Signed)
 Stable, continue to monitor - Continue close monitoring with daily CBC - Hold anticoagulants for VTE ppx with <100k PLT

## 2023-12-12 NOTE — Consult Note (Signed)
 Consultation Note Date: 12/12/2023   Patient Name: Brianna Wheeler  DOB: [birth year]  MRN: [id number]  Age / Sex: 68 y.o., female  PCP: Kandis Ormond, DO Referring Physician: Kandis Ormond, DO  Reason for Consultation:  poor prognosis, anticipate she will continue to decline; need GOC while she has capacity- no family members involved who could make decisions  HPI/Patient Profile: 68 y.o. female  with past medical history of NASH cirrhosis, thrombocytopenia, smoldering myeloma, DM2, HTN, hypothyroid, HLD admitted on 12/09/2023 with altered  mental status. Workup revealed likely due to decompensated cirrhosis with elevated ammonia. Palliative medicine consulted for GOC.    Primary Decision Maker PATIENT - she designates her surrogate to be her neighbor- [name]  Discussion: Chart reviewed including labs, progress notes, imaging from this and previous encounters.  Reviewed attending note- patient mental status greatly improved since admission. Notes from previous encounters reviewed- noting involvement of APS regarding patient's son needing caregiver.  Labs reviewed- Cr elevated on admission- 1.26- about the same today at 1.25. Liver function decreased with ammonia- 65; INR - 1.9; albumin 1.6; bili 3.5. I calculated MELD score to be 21 on admission with estimated 19.6% risk or mortality in 3 months.  Met with patient at bedside. She was able to tell me she is in the hospital. With prompting she was able to discuss her health issues with her hepatic cirrhosis being primary concern. She complained of pain in her feet and back. Attempted to discuss advanced care planning and goals of care. She had limited insight into her poor state of health. She shared that her hope is to discharge back to SNF and then to discharge home- I attempted to ask her about her plans if she went to SNF and she was not able to get  strong enough to be on her own at home. She declined to consider this possibility.  I attempted to discuss her end of life wishes. She stated that she wasn't going to die and declined to discuss further. However, she did note that she does not want resuscitation or life support if she were to die. She currently has DNR status in place.  We discussed surrogate decision maker. She initially said she would want her son to be her decision maker- however, when I pointed out that her son requires caretakers who make decisions for him, I worry about his ability to make decisions for her. She acquiesced and said she had discussed with her neighbor [name] and he had agreed to be her surrogate decision maker if needed.  I recommended that she complete an HCPOA designating [name] and she agreed. She provided me with [facility] phone number and agreed to allow me to place it on her chart as an emergency contact.     SUMMARY OF RECOMMENDATIONS -Continue current interventions- patient wishes for d/c to SNF -Spiritual care consult placed for completion of HCPOA document -Pain- pain in feet and back- start acetaminophen  500mg  q8hrs prn- do not exceed 2000mg  in 24 hours- reduced  dose due to hepatic dysfunction- if escalation of pain medication needed- recommend IV fentanyl - would avoid morphine, pt allergic to oxycodone and hydrocodone    Code Status/Advance Care Planning:   Code Status: Limited: Do not attempt resuscitation (DNR) -DNR-LIMITED -Do Not Intubate/DNI     Prognosis:   Unable to determine  Discharge Planning: Skilled Nursing Facility for rehab with Palliative care service follow-up  Primary Diagnoses: Present on Admission:  Hypokalemia  Cirrhosis of liver without ascites (HCC)  Thrombocytopenia  Anasarca   Review of Systems  Constitutional:  Positive for activity change and fatigue.    Physical Exam Vitals and nursing note reviewed.  Constitutional:      Appearance: She is obese.   Cardiovascular:     Rate and Rhythm: Normal rate.  Pulmonary:     Effort: Pulmonary effort is normal.  Neurological:     Mental Status: She is oriented to person, place, and time.     Vital Signs: BP (!) 111/53 (BP Location: Left Arm)   Pulse 82   Temp 99 F (37.2 C) (Axillary)   Resp 16   Ht 5\' 11"  (1.803 m)   Wt (!) 140.8 kg   SpO2 99%   BMI 43.29 kg/m  Pain Scale: 0-10   Pain Score: Asleep   SpO2: SpO2: 99 % O2 Device:SpO2: 99 % O2 Flow Rate: .   IO: Intake/output summary:  Intake/Output Summary (Last 24 hours) at 12/12/2023 1438 Last data filed at 12/12/2023 1233 Gross per 24 hour  Intake 480 ml  Output 1050 ml  Net -570 ml    LBM: Last BM Date : 12/12/23 Baseline Weight: Weight: (!) 139 kg Most recent weight: Weight: (!) 140.8 kg       Thank you for this consult. Palliative medicine will continue to follow and assist as needed.   Signed by: Micki Alas, AGNP-C Palliative Medicine  Time includes:   Preparing to see the patient (e.g., review of tests) Obtaining and/or reviewing separately obtained history Performing a medically necessary appropriate examination and/or evaluation Counseling and educating the patient/family/caregiver Ordering medications, tests, or procedures Referring and communicating with other health care professionals (when not reported separately) Documenting clinical information in the electronic or other health record Independently interpreting results (not reported separately) and communicating results to the patient/family/caregiver Care coordination (not reported separately) Clinical documentation   Please contact Palliative Medicine Team phone at [phone] for questions and concerns.  For individual provider: See Tilford Foley

## 2023-12-12 NOTE — Assessment & Plan Note (Signed)
 DMII: stable, continue CBG twice daily HTN: hypotensive, holding home BP meds Hypothyroidism: will restart home synthroid  with improved mental status HLD: restart home statin at discharge Pain: Hold home tramadol , Ordered x1 fentanyl   Vit D deficiency: continue to supplement, will need outpatient Vit D level checked . Portal venous thrombus: Found on admission, stable.  Patient GI follow-up. Urinary retention: Foley placed from 4/19 - 4/23.  Now voiding spontaneously.  To restart  Flomax  0.4 mg daily.

## 2023-12-12 NOTE — Assessment & Plan Note (Addendum)
 Potassium dropped to 2.9 overnight patient received 40 mEq with repeat BMP scheduled for noon today. - Scheduled potassium 40 mEq twice daily for 4 doses -One-time dose 20 mEq potassium -Recheck BMP and mag tonight at 1800. -Replete magnesium  with a goal of 2.0 or higher - Increase Aldactone to 50 mg today - AM BMP and mag

## 2023-12-12 NOTE — Progress Notes (Signed)
   12/12/23 1918  Assess: MEWS Score  Temp 98.1 F (36.7 C)  BP (!) 103/59  MAP (mmHg) 73  Pulse Rate (!) 111  ECG Heart Rate (!) 111  Resp 19  Level of Consciousness Alert  SpO2 96 %  O2 Device Room Air  Patient Activity (if Appropriate) In bed  Assess: MEWS Score  MEWS Temp 0  MEWS Systolic 0  MEWS Pulse 2  MEWS RR 0  MEWS LOC 0  MEWS Score 2  MEWS Score Color Yellow  Assess: if the MEWS score is Yellow or Red  Were vital signs accurate and taken at a resting state? Yes  Does the patient meet 2 or more of the SIRS criteria? Yes  Does the patient have a confirmed or suspected source of infection? No  MEWS guidelines implemented  Yes, yellow  Treat  MEWS Interventions Considered administering scheduled or prn medications/treatments as ordered  Take Vital Signs  Increase Vital Sign Frequency  Yellow: Q2hr x1, continue Q4hrs until patient remains green for 12hrs  Escalate  MEWS: Escalate Yellow: Discuss with charge nurse and consider notifying provider and/or RRT  Notify: Charge Nurse/RN  Name of Charge Nurse/RN Notified Matthew, RN  Provider Notification  Provider Name/Title Verdell Given, MD  Date Provider Notified 12/12/23  Time Provider Notified 1941  Method of Notification Page  Notification Reason Change in status;Other (Comment) (increased HR)  Provider response See new orders  Date of Provider Response 12/12/23  Time of Provider Response 2005  Assess: SIRS CRITERIA  SIRS Temperature  0  SIRS Respirations  0  SIRS Pulse 1  SIRS WBC 1  SIRS Score Sum  2

## 2023-12-12 NOTE — Assessment & Plan Note (Addendum)
 Stable, continue to monitor - Continue torsemide to 10 mg -Monitor for signs of pulmonary edema

## 2023-12-13 ENCOUNTER — Other Ambulatory Visit (HOSPITAL_COMMUNITY)

## 2023-12-13 ENCOUNTER — Encounter

## 2023-12-13 DIAGNOSIS — K7460 Unspecified cirrhosis of liver: Secondary | ICD-10-CM | POA: Diagnosis not present

## 2023-12-13 DIAGNOSIS — E876 Hypokalemia: Secondary | ICD-10-CM | POA: Diagnosis not present

## 2023-12-13 DIAGNOSIS — D61818 Other pancytopenia: Secondary | ICD-10-CM

## 2023-12-13 DIAGNOSIS — R4182 Altered mental status, unspecified: Secondary | ICD-10-CM | POA: Diagnosis not present

## 2023-12-13 LAB — BASIC METABOLIC PANEL WITH GFR
Anion gap: 3 — ABNORMAL LOW (ref 5–15)
Anion gap: 5 (ref 5–15)
BUN: 12 mg/dL (ref 8–23)
BUN: 12 mg/dL (ref 8–23)
CO2: 29 mmol/L (ref 22–32)
CO2: 27 mmol/L (ref 22–32)
Calcium: 7.8 mg/dL — ABNORMAL LOW (ref 8.9–10.3)
Calcium: 8 mg/dL — ABNORMAL LOW (ref 8.9–10.3)
Chloride: 104 mmol/L (ref 98–111)
Chloride: 104 mmol/L (ref 98–111)
Creatinine, Ser: 1.11 mg/dL — ABNORMAL HIGH (ref 0.44–1.00)
Creatinine, Ser: 1.19 mg/dL — ABNORMAL HIGH (ref 0.44–1.00)
GFR, Estimated: 54 mL/min — ABNORMAL LOW (ref >60)
GFR, Estimated: 49 mL/min — ABNORMAL LOW (ref >60)
Glucose, Bld: 100 mg/dL — ABNORMAL HIGH (ref 70–99)
Glucose, Bld: 101 mg/dL — ABNORMAL HIGH (ref 70–99)
Potassium: 3.4 mmol/L — ABNORMAL LOW (ref 3.5–5.1)
Potassium: 3.7 mmol/L (ref 3.5–5.1)
Sodium: 136 mmol/L (ref 135–145)
Sodium: 136 mmol/L (ref 135–145)

## 2023-12-13 LAB — CBC
HCT: 30.2 % — ABNORMAL LOW (ref 36.0–46.0)
Hemoglobin: 10 g/dL — ABNORMAL LOW (ref 12.0–15.0)
MCH: 33.9 pg (ref 26.0–34.0)
MCHC: 33.1 g/dL (ref 30.0–36.0)
MCV: 102.4 fL — ABNORMAL HIGH (ref 80.0–100.0)
Platelets: 51 10*3/uL — ABNORMAL LOW (ref 150–400)
RBC: 2.95 MIL/uL — ABNORMAL LOW (ref 3.87–5.11)
RDW: 16.1 % — ABNORMAL HIGH (ref 11.5–15.5)
WBC: 2.8 10*3/uL — ABNORMAL LOW (ref 4.0–10.5)
nRBC: 0 % (ref 0.0–0.2)

## 2023-12-13 LAB — SODIUM, URINE, RANDOM: Sodium, Ur: 37 mmol/L

## 2023-12-13 LAB — CREATININE, URINE, RANDOM: Creatinine, Urine: 101 mg/dL

## 2023-12-13 LAB — MAGNESIUM: Magnesium: 2 mg/dL (ref 1.7–2.4)

## 2023-12-13 MED ORDER — POTASSIUM CHLORIDE CRYS ER 20 MEQ PO TBCR
40.0000 meq | EXTENDED_RELEASE_TABLET | Freq: Two times a day (BID) | ORAL | Status: DC
  Administered 2023-12-13 – 2023-12-21 (×16): 40 meq via ORAL
  Filled 2023-12-13 (×16): qty 2

## 2023-12-13 MED ORDER — POTASSIUM CHLORIDE CRYS ER 20 MEQ PO TBCR: 20.0000 meq | EXTENDED_RELEASE_TABLET | Freq: Once | ORAL | Status: DC

## 2023-12-13 NOTE — Plan of Care (Signed)
  Problem: Clinical Measurements: Goal: Respiratory complications will improve Outcome: Progressing Goal: Cardiovascular complication will be avoided Outcome: Progressing   Problem: Nutrition: Goal: Adequate nutrition will be maintained Outcome: Progressing   Problem: Elimination: Goal: Will not experience complications related to bowel motility Outcome: Progressing Goal: Will not experience complications related to urinary retention Outcome: Progressing   Problem: Safety: Goal: Ability to remain free from injury will improve Outcome: Progressing   Problem: Skin Integrity: Goal: Risk for impaired skin integrity will decrease Outcome: Progressing

## 2023-12-13 NOTE — Assessment & Plan Note (Signed)
 Afebrile, stable vitals.  Stable platelet count.  CTM - Continue home lactulose  - Fall precautions - Monitor routine vitals, fever curve

## 2023-12-13 NOTE — Assessment & Plan Note (Signed)
 DMII: stable, continue CBG twice daily HTN: hypotensive, holding home BP meds Hypothyroidism: will restart home synthroid  with improved mental status HLD: restart home statin at discharge Pain: Hold home tramadol , Ordered x1 fentanyl   Vit D deficiency: continue to supplement, will need outpatient Vit D level checked . Portal venous thrombus: Found on admission, stable.  Patient GI follow-up. Urinary retention: Foley placed from 4/19 - 4/23.  Now voiding spontaneously.  To restart  Flomax  0.4 mg daily.

## 2023-12-13 NOTE — Assessment & Plan Note (Signed)
 Stable, continue to monitor - Continue torsemide to 10 mg -Monitor for signs of pulmonary edema -Consider resuming home Flomax 

## 2023-12-13 NOTE — Evaluation (Signed)
 Physical Therapy Evaluation Patient Details Name: Brianna Wheeler MRN: [id number] DOB: 08/17/1955 Today's Date: 12/13/2023  History of Present Illness  68 yo patient presenting with AMS 2/2 NASH cirrhosis. She has a PMH of T2DM, hypertension, hypothyroid, HLD, portal venous thrombosis urinary retention.  Clinical Impression  Pt is known to therapist from prior hospitalization and pt has had severe decline in cognition and participation. With increased stimulation pt is able to state name and recall that her son's name is Brianna Wheeler. Pt is unable to participate in therapy today and requires total Ax2 for rolling to place clean pad. PT recommends return to [facility] at discharge. PT will follow back later next week to see if pt more participatory. Due to lack of movement PT recommending low air loss mattress and Prevalon boots. RN notified.        If plan is discharge home, recommend the following: Two people to help with walking and/or transfers;Two people to help with bathing/dressing/bathroom;Assistance with cooking/housework;Assistance with feeding;Direct supervision/assist for medications management;Direct supervision/assist for financial management;Assist for transportation;Help with stairs or ramp for entrance;Supervision due to cognitive status   Can travel by private vehicle   No    Equipment Recommendations Hospital bed     Functional Status Assessment Patient has had a recent decline in their functional status and demonstrates the ability to make significant improvements in function in a reasonable and predictable amount of time.     Precautions / Restrictions Precautions Precautions: Fall;Other (comment) Recall of Precautions/Restrictions: Impaired Precaution/Restrictions Comments: L eye visual deficits Restrictions Weight Bearing Restrictions Per Provider Order: No      Mobility  Bed Mobility Overal bed mobility: Needs Assistance Bed Mobility: Rolling Rolling: Total assist,  +2 for physical assistance         General bed mobility comments: pt unable to initiate any movment           Pertinent Vitals/Pain Pain Assessment Pain Assessment: Faces Faces Pain Scale: Hurts whole lot Pain Location: generalized/back with movement Pain Descriptors / Indicators: Sore, Grimacing, Moaning Pain Intervention(s): Limited activity within patient's tolerance, Monitored during session, Repositioned    Home Living Family/patient expects to be discharged to:: Skilled nursing facility                   Additional Comments: [facility]    Prior Function Prior Level of Function : Patient poor historian/Family not available                     Extremity/Trunk Assessment   Upper Extremity Assessment Upper Extremity Assessment: Defer to OT evaluation    Lower Extremity Assessment Lower Extremity Assessment: Generalized weakness;RLE deficits/detail;LLE deficits/detail RLE Deficits / Details: increased LE edema RLE: Unable to fully assess due to pain LLE Deficits / Details: increased LE edema LLE: Unable to fully assess due to pain    Cervical / Trunk Assessment Cervical / Trunk Assessment: Other exceptions Cervical / Trunk Exceptions: increased body habitus  Communication   Communication Communication: Impaired Factors Affecting Communication: Difficulty expressing self;Reduced clarity of speech    Cognition Arousal: Obtunded Behavior During Therapy: Flat affect                           PT - Cognition Comments: minimal response to questions Following commands: Impaired Following commands impaired:  (No command follow)     Cueing Cueing Techniques: Verbal cues, Gestural cues, Tactile cues, Visual cues     General Comments General  comments (skin integrity, edema, etc.): VSS on RA        Assessment/Plan    PT Assessment Patient needs continued PT services  PT Problem List Decreased strength;Decreased range of  motion;Decreased activity tolerance;Decreased balance;Decreased mobility;Decreased coordination;Decreased cognition;Decreased knowledge of use of DME;Obesity;Pain       PT Treatment Interventions Functional mobility training;Therapeutic activities;Therapeutic exercise;Balance training    PT Goals (Current goals can be found in the Care Plan section)  Acute Rehab PT Goals PT Goal Formulation: Patient unable to participate in goal setting Time For Goal Achievement: 12/27/23 Potential to Achieve Goals: Poor    Frequency Min 1X/week     Co-evaluation PT/OT/SLP Co-Evaluation/Treatment: Yes Reason for Co-Treatment: For patient/therapist safety;To address functional/ADL transfers PT goals addressed during session: Mobility/safety with mobility OT goals addressed during session: ADL's and self-care       AM-PAC PT "6 Clicks" Mobility  Outcome Measure Help needed turning from your back to your side while in a flat bed without using bedrails?: Total Help needed moving from lying on your back to sitting on the side of a flat bed without using bedrails?: Total Help needed moving to and from a bed to a chair (including a wheelchair)?: Total Help needed standing up from a chair using your arms (e.g., wheelchair or bedside chair)?: Total Help needed to walk in hospital room?: Total Help needed climbing 3-5 steps with a railing? : Total 6 Click Score: 6    End of Session   Activity Tolerance: Other (comment);Treatment limited secondary to medical complications (Comment) (no participation) Patient left: in bed Nurse Communication: Mobility status;Other (comment) (need for air bed and prevalon boots) PT Visit Diagnosis: Adult, failure to thrive (R62.7);Muscle weakness (generalized) (M62.81);Difficulty in walking, not elsewhere classified (R26.2) Pain - part of body:  (generalized)    Time: 1610-9604 PT Time Calculation (min) (ACUTE ONLY): 16 min   Charges:   PT Evaluation $PT Eval Low  Complexity: 1 Low   PT General Charges $$ ACUTE PT VISIT: 1 Visit         Foy Vanduyne B. [name] PT, DPT Acute Rehabilitation Services Please use secure chat or  Call Office [phone]   [name] [facility] 12/13/2023, 11:31 AM

## 2023-12-13 NOTE — Progress Notes (Signed)
 Daily Progress Note Intern Pager: [phone]  Patient name: Brianna Wheeler Medical record number: [id number] Date of birth: 1955/11/29 Age: 68 y.o. Gender: female  Primary Care Provider: Kandis Ormond, DO Consultants: None Code Status: DNR  Pt Overview and Major Events to Date:  5/19: Admitted  Assessment and Plan:  Patient is a 68 year old female admitted for altered mental status secondary to [facility] liver cirrhosis.  Appears confused this morning however mental status continues to wax and wean  Assessment & Plan AMS (altered mental status) Mental status continues to wax and wane. This morning appears confused and unable to follow instructions. Ordered Ammonia level and VBG.  Will consider Head CT if confusion persist. Abdominal US  completed, pending read. Not having adequate BM so fare 1-2 BM a day. Will crease Lactulose    -Increased lactulose  to 4 times daily, goal BM x3 a day - avoid CNS acting medications - Fall precautions - SCDs for VTE prophylaxis, PLT less than 100,000 -Place purewick for urinary incontinence - Daily bladder scans to assess for urinary retention Cirrhosis of liver without ascites (HCC)  Thrombocytopenia Stable and patient has remained afebrile.  Platelet still down trending, no signs of bleeding. - Continue home lactulose  - Fall precautions - Monitor routine vitals, fever curve Hypokalemia Potassium appropriate this morning.  Continue plan as below - Scheduled potassium 40 mEq twice daily for 4 doses -Recheck BMP and mag tonight at 1600. -Replete magnesium  with a goal of 2.0 or higher - Continue Aldactone 50 mg  - AM BMP and mag Anasarca Stable, continue to monitor - Continue torsemide to 10 mg -Monitor for signs of pulmonary edema -Consider resuming home Flomax  Pancytopenia (HCC) Stable, continue to monitor - Continue close monitoring with daily CBC - Hold anticoagulants for VTE ppx with <100k PLT  AKI (acute kidney injury)  (HCC) Improving.  Encourage p.o. hydration. Abdominal US  with bladder to ensure no urinary retention  -Avoid nephrotoxic agent - Daily BMP  Chronic health problem DMII: stable, continue CBG twice daily HTN: hypotensive, holding home BP meds Hypothyroidism: will restart home synthroid  with improved mental status HLD: restart home statin at discharge Pain: Hold home tramadol , Ordered x1 fentanyl   Vit D deficiency: continue to supplement, will need outpatient Vit D level checked . Portal venous thrombus: Found on admission, stable.  Patient GI follow-up. Urinary retention: Foley placed from 4/19 - 4/23.  Now voiding spontaneously.  To restart  Flomax  0.4 mg daily. Hypomagnesemia Appropriate today - Continue daily magnesium  level  FEN/GI: Regular diet PPx: SCDs Dispo:SNF pending clinical improvement .  Subjective:  Laying in bed and Not in Acute distress. Appears confused this morning, unable to follow command and only oriented to self   Objective: Temp:  [97.6 F (36.4 C)-99 F (37.2 C)] 98.7 F (37.1 C) (05/23 1924) Pulse Rate:  [79-113] 113 (05/23 1924) Resp:  [16-20] 16 (05/23 1524) BP: (100-124)/(55-67) 100/59 (05/23 1924) SpO2:  [96 %-99 %] 97 % (05/23 1924) Weight:  [140.1 kg] 140.1 kg (05/23 0537) Physical Exam: General: Awake, Confused, NAD CV: RRR, no murmurs, normal S1/S2 Pulm: CTAB, good WOB on RA, no crackles or wheezing Abd: Soft, no distension, no tenderness Neuro: Only oriented to person, unable to follow command,  Ext:  +4 BLE edema  Laboratory: Most recent CBC Lab Results  Component Value Date   WBC 2.8 (L) 12/13/2023   HGB 10.0 (L) 12/13/2023   HCT 30.2 (L) 12/13/2023   MCV 102.4 (H) 12/13/2023   PLT 51 (  L) 12/13/2023   Most recent BMP    Latest Ref Rng & Units 12/13/2023    5:15 PM  BMP  Glucose 70 - 99 mg/dL 161   BUN 8 - 23 mg/dL 12   Creatinine 0.96 - 1.00 mg/dL 0.45   Sodium 409 - 811 mmol/L 136   Potassium 3.5 - 5.1 mmol/L 3.7    Chloride 98 - 111 mmol/L 104   CO2 22 - 32 mmol/L 27   Calcium  8.9 - 10.3 mg/dL 8.0    Mag- 2.1  Imaging/Diagnostic Tests: No new images. Goble Last, MD 12/13/2023, 10:26 PM  PGY-3, [facility] Family Medicine FPTS Intern pager: [phone], text pages welcome Secure chat group [facility] Teaching Service

## 2023-12-13 NOTE — Assessment & Plan Note (Signed)
 Stable, continue to monitor - Continue close monitoring with daily CBC - Hold anticoagulants for VTE ppx with <100k PLT

## 2023-12-13 NOTE — Assessment & Plan Note (Addendum)
 Appropriate today - Continue daily magnesium  level

## 2023-12-13 NOTE — TOC Progression Note (Signed)
 Transition of Care [facility]) - Progression Note    Patient Details  Name: Brianna Wheeler MRN: [id number] Date of Birth: [birth year]  Transition of [facility]) CM/SW Contact  Arron Big, [place] Phone Number: 12/13/2023, 4:19 PM  Clinical Narrative:   Patient agreeable to going back to [facility]. CSW to complete FL2 and send it to facility.   TOC will continue to follow.    Expected Discharge Plan: Skilled Nursing Facility Barriers to Discharge: Continued Medical Work up  Expected Discharge Plan and Services In-house Referral: Clinical Social Work Discharge Planning Services: CM Consult   Living arrangements for the past 2 months: Single Family Home                                       Social Determinants of Health (SDOH) Interventions SDOH Screenings   Food Insecurity: No Food Insecurity (12/10/2023)  Housing: Low Risk  (12/10/2023)  Transportation Needs: No Transportation Needs (12/10/2023)  Utilities: Not At Risk (12/10/2023)  Alcohol Screen: Low Risk  (09/30/2023)  Depression (PHQ2-9): Low Risk  (09/30/2023)  Financial Resource Strain: Low Risk  (09/30/2023)  Physical Activity: Insufficiently Active (09/30/2023)  Social Connections: Socially Isolated (12/10/2023)  Stress: No Stress Concern Present (09/30/2023)  Tobacco Use: Low Risk  (12/10/2023)  Health Literacy: Adequate Health Literacy (09/30/2023)    Readmission Risk Interventions    11/25/2023    1:00 PM  Readmission Risk Prevention Plan  Transportation Screening Complete  Palliative Care Screening Not Applicable  Medication Review (RN Care Manager) Complete

## 2023-12-13 NOTE — Assessment & Plan Note (Signed)
 Improving.  Encourage p.o. hydration. FeNa calculation suggests pre-renal cause, but may not be accurate due to diuresis.  -Avoid nephrotoxic agent - Daily BMP -fu urine urea nitrogen

## 2023-12-13 NOTE — Assessment & Plan Note (Addendum)
 Potassium appropriate this morning.  Continue plan as below - Scheduled potassium 40 mEq twice daily for 4 doses -Recheck BMP and mag tonight at 1600. -Replete magnesium  with a goal of 2.0 or higher - Continue Aldactone 50 mg  - AM BMP and mag

## 2023-12-13 NOTE — Assessment & Plan Note (Addendum)
 Improving.  Encourage p.o. hydration. Abdominal US  with bladder to ensure no urinary retention  -Avoid nephrotoxic agent - Daily BMP

## 2023-12-13 NOTE — Assessment & Plan Note (Addendum)
 Stable and patient has remained afebrile.  Platelet still down trending, no signs of bleeding. - Continue home lactulose  - Fall precautions - Monitor routine vitals, fever curve

## 2023-12-13 NOTE — Assessment & Plan Note (Addendum)
 Potassium improved to 3.4 this morning.  Continue plan as below - Scheduled potassium 40 mEq twice daily for 4 doses -Recheck BMP and mag tonight at 1600. -Replete magnesium  with a goal of 2.0 or higher - Continue Aldactone  50 mg  - AM BMP and mag

## 2023-12-13 NOTE — Assessment & Plan Note (Addendum)
 Mental status continues to wax and wane. This morning appears confused and unable to follow instructions. Ordered Ammonia level and VBG.  Will consider Head CT if confusion persist. Abdominal US  completed, pending read. Not having adequate BM so fare 1-2 BM a day. Will crease Lactulose    -Increased lactulose  to 4 times daily, goal BM x3 a day - avoid CNS acting medications - Fall precautions - SCDs for VTE prophylaxis, PLT less than 100,000 -Place purewick for urinary incontinence - Daily bladder scans to assess for urinary retention

## 2023-12-13 NOTE — NC FL2 (Signed)
 [place]  MEDICAID FL2 LEVEL OF CARE FORM     IDENTIFICATION  Patient Name: Brianna Wheeler Birthdate: May 07, 1956 Sex: female Admission Date (Current Location): 12/09/2023  [facility] [place] [facility] [place] Number:  Producer, television/film/video and Address:  [facility] [place], [address], [place], [place] [address]      Provider Number: [id number]  Attending Physician Name and Address:  Kandis Ormond, DO  Relative Name and Phone Number:       Current Level of Care: Hospital Recommended Level of Care: Skilled Nursing Facility Prior Approval Number:    Date Approved/Denied:   PASRR Number: [id number]  Discharge Plan: SNF    Current Diagnoses: Patient Active Problem List   Diagnosis Date Noted   AKI (acute kidney injury) (HCC) 12/10/2023   Hypomagnesemia 12/10/2023   Hyperammonemia (HCC) 12/09/2023   AMS (altered mental status) 12/09/2023   Pancytopenia (HCC) 12/09/2023   Anemia 11/19/2023   Vaginal bleeding 11/16/2023   Vitamin D  deficiency 11/11/2023   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   High serum transferrin saturation 11/08/2023   Cirrhosis of liver without ascites (HCC)  Thrombocytopenia 11/07/2023   Chronic health problem 11/06/2023   Possible Portal vein/Splenic vein thrombosis on CT 11/06/2023   Anasarca 11/06/2023   Left eye Retinal Detachment 11/06/2023   Calcification of lens, Left 11/06/2023   Eosinophilia, unspecified 11/06/2023   Thrombocytopenia (HCC) 11/06/2023   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   Systolic murmur 11/06/2023   Right knee pain 04/29/2023   Tremor of both hands 11/02/2022   Headache 04/05/2022   Frequent falls 09/13/2021   Hypokalemia 07/28/2021   Morbid obesity (HCC) 12/08/2020   Adjustment reaction with mixed emotional features 09/12/2020   Serum gammaglobulin increased 02/11/2019   Chronic venous insufficiency 01/28/2019   Nocturia 01/28/2019   GERD (gastroesophageal reflux disease) 05/01/2016   Vertebral compression  fracture (HCC) 12/09/2015   Metatarsalgia of both feet 09/20/2015   Allergic rhinitis 08/30/2015   Transaminitis 04/29/2015   Cataract 01/01/2013   Back pain 06/10/2012   Colon cancer screening 02/11/2012   Asthma 12/07/2011   Depression 05/10/2011   Chronic sinusitis 09/20/2010   Osteoarthritis 04/07/2009   Hypothyroidism 03/03/2008   Lumbar back pain with radiculopathy affecting right lower extremity 10/03/2007   Prediabetes 05/08/2007   HLD (hyperlipidemia) 09/19/2006   HYPERTENSION, BENIGN SYSTEMIC 09/19/2006    Orientation RESPIRATION BLADDER Height & Weight     Self, Place  Normal Incontinent, External catheter Weight: (!) 308 lb 13.8 oz (140.1 kg) Height:  5\' 11"  (180.3 cm)  BEHAVIORAL SYMPTOMS/MOOD NEUROLOGICAL BOWEL NUTRITION STATUS      Incontinent Diet (See dc summary)  AMBULATORY STATUS COMMUNICATION OF NEEDS Skin   Extensive Assist Verbally Normal                       Personal Care Assistance Level of Assistance  Bathing, Feeding, Dressing Bathing Assistance: Maximum assistance Feeding assistance: Limited assistance Dressing Assistance: Maximum assistance     Functional Limitations Info  Sight, Hearing, Speech Sight Info: Impaired Hearing Info: Adequate Speech Info: Adequate    SPECIAL CARE FACTORS FREQUENCY  PT (By licensed PT), OT (By licensed OT)     PT Frequency: 5x min weekly OT Frequency: 5x min weekly            Contractures Contractures Info: Not present    Additional Factors Info  Code Status, Allergies Code Status Info: Full Allergies Info: Hydrocodone -acetaminophen ,Nsaids,Oxycodone-acetaminophen ,Tetracycline,Bactrim  (sulfamethoxazole -trimethoprim )  Current Medications (12/13/2023):  This is the current hospital active medication list Current Facility-Administered Medications  Medication Dose Route Frequency Provider Last Rate Last Admin   acetaminophen  (TYLENOL ) tablet 500 mg  500 mg Oral Q8H PRN Mahan, Kasie J,  NP   500 mg at 12/12/23 1655   lactulose  (CHRONULAC ) 10 GM/15ML solution 20 g  20 g Oral TID Albin Huh, MD   20 g at 12/13/23 1012   levothyroxine  (SYNTHROID ) tablet 125 mcg  125 mcg Oral Q0600 Shitarev, Dimitry, MD   125 mcg at 12/13/23 0540   lidocaine  (LIDODERM ) 5 % 1 patch  1 patch Transdermal QHS Goble Last, MD   1 patch at 12/11/23 2206   potassium chloride  SA (KLOR-CON  M) CR tablet 40 mEq  40 mEq Oral BID Miller, Samantha, DO   40 mEq at 12/13/23 1012   spironolactone (ALDACTONE) tablet 50 mg  50 mg Oral Daily Miller, Samantha, DO   50 mg at 12/13/23 1013   torsemide (DEMADEX) tablet 10 mg  10 mg Oral Daily Zheng, Jacky, MD   10 mg at 12/13/23 1012     Discharge Medications: Please see discharge summary for a list of discharge medications.  Relevant Imaging Results:  Relevant Lab Results:   Additional Information [id number]  Kalisha Keadle C Jazen Spraggins, LCSWA

## 2023-12-13 NOTE — Progress Notes (Signed)
 Daily Progress Note Intern Pager: [phone]  Patient name: Brianna Wheeler Medical record number: [id number] Date of birth: [birth year] Age: 68 y.o. Gender: female  Primary Care Provider: Kandis Ormond, DO Consultants: none Code Status: DNR  Pt Overview and Major Events to Date:  5/19: Admitted  Assessment and Plan:  This is a 68 year old female patient admitted for AMS secondary to [facility] cirrhosis.  Her mental status continues to wax and wane, but is otherwise stable. Assessment & Plan AMS (altered mental status) Palliative met with the patient yesterday to discuss decision making, see their notes for details.  Continues to be waxing and waning, this appears to be patient's baseline. -Continue lactulose , goal BM x3 a day - avoid CNS acting medications - repeat BMP - Fall precautions - SCDs for VTE prophylaxis, PLT less than 100,000 -Place purewick for urinary incontinence - Daily bladder scans to assess for urinary retention Cirrhosis of liver without ascites (HCC)  Thrombocytopenia Afebrile, stable vitals.  Stable platelet count.  CTM - Continue home lactulose  - Fall precautions - Monitor routine vitals, fever curve Hypokalemia Potassium improved to 3.4 this morning.  Continue plan as below - Scheduled potassium 40 mEq twice daily for 4 doses -Recheck BMP and mag tonight at 1600. -Replete magnesium  with a goal of 2.0 or higher - Continue Aldactone 50 mg  - AM BMP and mag Anasarca Stable, continue to monitor - Continue torsemide to 10 mg -Monitor for signs of pulmonary edema -Consider resuming home Flomax  Pancytopenia (HCC) Stable, continue to monitor - Continue close monitoring with daily CBC - Hold anticoagulants for VTE ppx with <100k PLT  AKI (acute kidney injury) (HCC) Improving.  Encourage p.o. hydration. FeNa calculation suggests pre-renal cause, but may not be accurate due to diuresis.  -Avoid nephrotoxic agent - Daily BMP -fu urine urea  nitrogen Chronic health problem DMII: stable, continue CBG twice daily HTN: hypotensive, holding home BP meds Hypothyroidism: will restart home synthroid  with improved mental status HLD: restart home statin at discharge Pain: Hold home tramadol , Ordered x1 fentanyl   Vit D deficiency: continue to supplement, will need outpatient Vit D level checked . Portal venous thrombus: Found on admission, stable.  Patient GI follow-up. Urinary retention: Foley placed from 4/19 - 4/23.  Now voiding spontaneously.  To restart  Flomax  0.4 mg daily.  FEN/GI: Regular diet PPx: SCDs Dispo:SNF pending clinical improvement .    Subjective:  No complaints this morning, no acute overnight events.  Continues to complain of fatigue.  Objective: Temp:  [97.6 F (36.4 C)-99 F (37.2 C)] 97.6 F (36.4 C) (05/23 0537) Pulse Rate:  [81-112] 101 (05/23 0537) Resp:  [16-19] 18 (05/23 0537) BP: (96-123)/(53-67) 100/61 (05/23 0537) SpO2:  [96 %-99 %] 97 % (05/23 0537) Weight:  [140.1 kg] 140.1 kg (05/23 0537) Physical Exam: General: Ill-appearing elderly female, no distress Cardiovascular: RRR, no M/R/G Respiratory: CTAB, no increased work of breathing Abdomen: Soft, diffusely swollen.  Unchanged from previous exams Extremities: 4+ pitting edema to the bilateral lower extremity  Laboratory: Most recent CBC Lab Results  Component Value Date   WBC 2.8 (L) 12/13/2023   HGB 10.0 (L) 12/13/2023   HCT 30.2 (L) 12/13/2023   MCV 102.4 (H) 12/13/2023   PLT 51 (L) 12/13/2023   Most recent BMP    Latest Ref Rng & Units 12/13/2023    3:31 AM  BMP  Glucose 70 - 99 mg/dL 664   BUN 8 - 23 mg/dL 12   Creatinine 4.03 -  1.00 mg/dL 8.29   Sodium 562 - 130 mmol/L 136   Potassium 3.5 - 5.1 mmol/L 3.4   Chloride 98 - 111 mmol/L 104   CO2 22 - 32 mmol/L 29   Calcium  8.9 - 10.3 mg/dL 7.8     Rayma Calandra, DO 12/13/2023, 7:44 AM  PGY-1, [facility] Family [facility] FPTS Intern pager: [phone], text pages  welcome Secure chat group [facility] Teaching Service

## 2023-12-13 NOTE — Assessment & Plan Note (Addendum)
 Palliative met with the patient yesterday to discuss decision making, see their notes for details.  Continues to be waxing and waning, this appears to be patient's baseline. -Continue lactulose , goal BM x3 a day - avoid CNS acting medications - repeat BMP - Fall precautions - SCDs for VTE prophylaxis, PLT less than 100,000 -Place purewick for urinary incontinence - Daily bladder scans to assess for urinary retention

## 2023-12-13 NOTE — Progress Notes (Signed)
  Progress Note   Date: 12/11/2023  Patient Name: Brianna Wheeler        MRN#: [id number]   Clarification of diagnosis:   Obesity class III

## 2023-12-13 NOTE — Evaluation (Signed)
 Occupational Therapy Evaluation Patient Details Name: Brianna Wheeler MRN: [id number] DOB: August 28, 1955 Today's Date: 12/13/2023   History of Present Illness   68 yo patient presenting with AMS 2/2 NASH cirrhosis. She has a PMH of T2DM, hypertension, hypothyroid, HLD, portal venous thrombosis urinary retention.     Clinical Impressions Pt unable to report how she was functioning since at [facility] for rehab. Pt minimally verbal and unable to follow commands. Pt presents with generalized pain and global weakness. She requires +2 total assist for bed level mobility and is dependent in all ADLs. Patient will benefit from continued inpatient follow up therapy, <3 hours/day.     If plan is discharge home, recommend the following:   Two people to help with walking and/or transfers;Two people to help with bathing/dressing/bathroom;Assistance with cooking/housework;Direct supervision/assist for medications management;Direct supervision/assist for financial management;Assist for transportation;Help with stairs or ramp for entrance     Functional Status Assessment   Patient has had a recent decline in their functional status and/or demonstrates limited ability to make significant improvements in function in a reasonable and predictable amount of time     Equipment Recommendations   Hospital bed;Hoyer lift     Recommendations for Other Services         Precautions/Restrictions   Precautions Precautions: Fall;Other (comment) Recall of Precautions/Restrictions: Impaired Precaution/Restrictions Comments: L eye visual deficits Restrictions Weight Bearing Restrictions Per Provider Order: No     Mobility Bed Mobility Overal bed mobility: Needs Assistance Bed Mobility: Rolling Rolling: Total assist, +2 for physical assistance         General bed mobility comments: assist for all aspects to change bed linen and reposition    Transfers                   General  transfer comment: deferred      Balance                                           ADL either performed or assessed with clinical judgement   ADL                                         General ADL Comments: dependent     Vision Baseline Vision/History: 2 Legally blind Ability to See in Adequate Light: 2 Moderately impaired Patient Visual Report: No change from baseline       Perception         Praxis         Pertinent Vitals/Pain Pain Assessment Pain Assessment: Faces Faces Pain Scale: Hurts whole lot Pain Location: generalized with movement Pain Descriptors / Indicators: Sore, Grimacing, Moaning Pain Intervention(s): Repositioned     Extremity/Trunk Assessment Upper Extremity Assessment Upper Extremity Assessment: Generalized weakness   Lower Extremity Assessment Lower Extremity Assessment: Defer to PT evaluation RLE Deficits / Details: increased LE edema RLE: Unable to fully assess due to pain LLE Deficits / Details: increased LE edema LLE: Unable to fully assess due to pain   Cervical / Trunk Assessment Cervical / Trunk Assessment: Other exceptions Cervical / Trunk Exceptions: increased body habitus, weakness   Communication Communication Communication: Impaired Factors Affecting Communication: Difficulty expressing self;Reduced clarity of speech   Cognition Arousal: Obtunded Behavior During Therapy: Flat affect Cognition: Cognition impaired  OT - Cognition Comments: minimally verbal, aware that her son is named Brianna Wheeler                 Following commands: Impaired Following commands impaired:  (not following commands)     Cueing  General Comments      VSS on RA   Exercises     Shoulder Instructions      Home Living Family/patient expects to be discharged to:: Skilled nursing facility                                 Additional Comments: [facility]      Prior  Functioning/Environment Prior Level of Function : Patient poor historian/Family not available                    OT Problem List: Decreased strength;Impaired vision/perception;Decreased coordination;Decreased cognition;Obesity;Impaired UE functional use;Pain   OT Treatment/Interventions: Self-care/ADL training;Therapeutic exercise;Therapeutic activities;Cognitive remediation/compensation;Patient/family education;Balance training      OT Goals(Current goals can be found in the care plan section)   Acute Rehab OT Goals OT Goal Formulation: Patient unable to participate in goal setting Time For Goal Achievement: 12/28/23 Potential to Achieve Goals: Fair ADL Goals Pt Will Perform Eating: with mod assist;bed level;sitting Pt Will Perform Grooming: with mod assist;sitting;bed level Pt/caregiver will Perform Home Exercise Program: Increased strength;Both right and left upper extremity;With minimal assist (A/AROM) Additional ADL Goal #1: Pt will roll with moderate assist for pericare and repositioning. Additional ADL Goal #2: Pt will follow one step commands with 50% accuracy.   OT Frequency:  Min 1X/week    Co-evaluation PT/OT/SLP Co-Evaluation/Treatment: Yes Reason for Co-Treatment: For patient/therapist safety;To address functional/ADL transfers PT goals addressed during session: Mobility/safety with mobility OT goals addressed during session: ADL's and self-care      AM-PAC OT "6 Clicks" Daily Activity     Outcome Measure Help from another person eating meals?: Total Help from another person taking care of personal grooming?: Total Help from another person toileting, which includes using toliet, bedpan, or urinal?: Total Help from another person bathing (including washing, rinsing, drying)?: Total Help from another person to put on and taking off regular upper body clothing?: Total Help from another person to put on and taking off regular lower body clothing?: Total 6  Click Score: 6   End of Session    Activity Tolerance: Patient limited by pain;Patient limited by lethargy Patient left: in bed;with call bell/phone within reach;with bed alarm set  OT Visit Diagnosis: Muscle weakness (generalized) (M62.81);Other symptoms and signs involving cognitive function;Pain                Time: 4098-1191 OT Time Calculation (min): 15 min Charges:  OT General Charges $OT Visit: 1 Visit OT Evaluation $OT Eval Moderate Complexity: 1 Mod  Avanell Leigh, OTR/L Acute Rehabilitation Services Office: [phone]   Jonette Nestle 12/13/2023, 1:01 PM

## 2023-12-14 ENCOUNTER — Inpatient Hospital Stay (HOSPITAL_COMMUNITY)

## 2023-12-14 DIAGNOSIS — N179 Acute kidney failure, unspecified: Secondary | ICD-10-CM | POA: Diagnosis not present

## 2023-12-14 DIAGNOSIS — K7460 Unspecified cirrhosis of liver: Secondary | ICD-10-CM | POA: Diagnosis not present

## 2023-12-14 DIAGNOSIS — E876 Hypokalemia: Secondary | ICD-10-CM | POA: Diagnosis not present

## 2023-12-14 DIAGNOSIS — R4182 Altered mental status, unspecified: Secondary | ICD-10-CM | POA: Diagnosis not present

## 2023-12-14 LAB — BASIC METABOLIC PANEL WITH GFR
Anion gap: 5 (ref 5–15)
BUN: 12 mg/dL (ref 8–23)
CO2: 25 mmol/L (ref 22–32)
Calcium: 7.7 mg/dL — ABNORMAL LOW (ref 8.9–10.3)
Chloride: 107 mmol/L (ref 98–111)
Creatinine, Ser: 1.11 mg/dL — ABNORMAL HIGH (ref 0.44–1.00)
GFR, Estimated: 54 mL/min — ABNORMAL LOW (ref >60)
Glucose, Bld: 96 mg/dL (ref 70–99)
Potassium: 3.8 mmol/L (ref 3.5–5.1)
Sodium: 137 mmol/L (ref 135–145)

## 2023-12-14 LAB — RENAL FUNCTION PANEL
Albumin: <1.5 g/dL — ABNORMAL LOW (ref 3.5–5.0)
Anion gap: 6 (ref 5–15)
BUN: 11 mg/dL (ref 8–23)
CO2: 26 mmol/L (ref 22–32)
Calcium: 7.9 mg/dL — ABNORMAL LOW (ref 8.9–10.3)
Chloride: 104 mmol/L (ref 98–111)
Creatinine, Ser: 1.09 mg/dL — ABNORMAL HIGH (ref 0.44–1.00)
GFR, Estimated: 55 mL/min — ABNORMAL LOW (ref >60)
Glucose, Bld: 87 mg/dL (ref 70–99)
Phosphorus: >30 mg/dL — ABNORMAL HIGH (ref 2.5–4.6)
Potassium: 4.9 mmol/L (ref 3.5–5.1)
Sodium: 136 mmol/L (ref 135–145)

## 2023-12-14 LAB — CBC
HCT: 32.6 % — ABNORMAL LOW (ref 36.0–46.0)
Hemoglobin: 10.5 g/dL — ABNORMAL LOW (ref 12.0–15.0)
MCH: 33.9 pg (ref 26.0–34.0)
MCHC: 32.2 g/dL (ref 30.0–36.0)
MCV: 105.2 fL — ABNORMAL HIGH (ref 80.0–100.0)
Platelets: 47 10*3/uL — ABNORMAL LOW (ref 150–400)
RBC: 3.1 MIL/uL — ABNORMAL LOW (ref 3.87–5.11)
RDW: 16.8 % — ABNORMAL HIGH (ref 11.5–15.5)
WBC: 3.7 10*3/uL — ABNORMAL LOW (ref 4.0–10.5)
nRBC: 0 % (ref 0.0–0.2)

## 2023-12-14 LAB — BLOOD GAS, VENOUS
Acid-Base Excess: 7.8 mmol/L — ABNORMAL HIGH (ref 0.0–2.0)
Bicarbonate: 30.9 mmol/L — ABNORMAL HIGH (ref 20.0–28.0)
Drawn by: 714861
O2 Saturation: 89.4 %
Patient temperature: 36.9
pCO2, Ven: 37 mmHg — ABNORMAL LOW (ref 44–60)
pH, Ven: 7.53 — ABNORMAL HIGH (ref 7.25–7.43)
pO2, Ven: 55 mmHg — ABNORMAL HIGH (ref 32–45)

## 2023-12-14 LAB — CULTURE, BLOOD (ROUTINE X 2)
Culture: NO GROWTH
Culture: NO GROWTH

## 2023-12-14 LAB — MAGNESIUM: Magnesium: 2.1 mg/dL (ref 1.7–2.4)

## 2023-12-14 LAB — AMMONIA: Ammonia: 77 umol/L — ABNORMAL HIGH (ref 9–35)

## 2023-12-14 LAB — UREA NITROGEN, URINE: Urea Nitrogen, Ur: 498 mg/dL

## 2023-12-14 MED ORDER — LACTULOSE 10 GM/15ML PO SOLN
20.0000 g | Freq: Four times a day (QID) | ORAL | Status: DC
  Administered 2023-12-14 – 2023-12-16 (×8): 20 g via ORAL
  Filled 2023-12-14 (×6): qty 30

## 2023-12-14 MED ORDER — LACTULOSE ENEMA
300.0000 mL | Freq: Once | ORAL | Status: AC
  Administered 2023-12-14: 300 mL via RECTAL
  Filled 2023-12-14: qty 300

## 2023-12-14 MED ORDER — LACTULOSE 10 GM/15ML PO SOLN
20.0000 g | Freq: Four times a day (QID) | ORAL | Status: DC
  Filled 2023-12-14: qty 30

## 2023-12-14 NOTE — Plan of Care (Signed)
 Patient remains weak and swollen. Poor appetite. Patient refusing oral lactulosw.

## 2023-12-15 DIAGNOSIS — R410 Disorientation, unspecified: Secondary | ICD-10-CM | POA: Diagnosis not present

## 2023-12-15 DIAGNOSIS — E876 Hypokalemia: Secondary | ICD-10-CM | POA: Diagnosis not present

## 2023-12-15 DIAGNOSIS — N179 Acute kidney failure, unspecified: Secondary | ICD-10-CM | POA: Diagnosis not present

## 2023-12-15 DIAGNOSIS — R000 Tachycardia, unspecified: Secondary | ICD-10-CM

## 2023-12-15 LAB — COMPREHENSIVE METABOLIC PANEL WITH GFR
ALT: 40 U/L (ref 0–44)
AST: 95 U/L — ABNORMAL HIGH (ref 15–41)
Albumin: <1.5 g/dL — ABNORMAL LOW (ref 3.5–5.0)
Alkaline Phosphatase: 77 U/L (ref 38–126)
Anion gap: 5 (ref 5–15)
BUN: 13 mg/dL (ref 8–23)
CO2: 26 mmol/L (ref 22–32)
Calcium: 8.2 mg/dL — ABNORMAL LOW (ref 8.9–10.3)
Chloride: 107 mmol/L (ref 98–111)
Creatinine, Ser: 1.11 mg/dL — ABNORMAL HIGH (ref 0.44–1.00)
GFR, Estimated: 54 mL/min — ABNORMAL LOW (ref >60)
Glucose, Bld: 108 mg/dL — ABNORMAL HIGH (ref 70–99)
Potassium: 3.7 mmol/L (ref 3.5–5.1)
Sodium: 138 mmol/L (ref 135–145)
Total Bilirubin: 3.8 mg/dL — ABNORMAL HIGH (ref 0.0–1.2)
Total Protein: 5.8 g/dL — ABNORMAL LOW (ref 6.5–8.1)

## 2023-12-15 LAB — CBC
HCT: 31.1 % — ABNORMAL LOW (ref 36.0–46.0)
Hemoglobin: 10 g/dL — ABNORMAL LOW (ref 12.0–15.0)
MCH: 33.2 pg (ref 26.0–34.0)
MCHC: 32.2 g/dL (ref 30.0–36.0)
MCV: 103.3 fL — ABNORMAL HIGH (ref 80.0–100.0)
Platelets: 53 10*3/uL — ABNORMAL LOW (ref 150–400)
RBC: 3.01 MIL/uL — ABNORMAL LOW (ref 3.87–5.11)
RDW: 16.9 % — ABNORMAL HIGH (ref 11.5–15.5)
WBC: 2.5 10*3/uL — ABNORMAL LOW (ref 4.0–10.5)
nRBC: 0 % (ref 0.0–0.2)

## 2023-12-15 LAB — MAGNESIUM: Magnesium: 2 mg/dL (ref 1.7–2.4)

## 2023-12-15 NOTE — Assessment & Plan Note (Signed)
 DMII: stable, continue CBG twice daily HTN: hypotensive, holding home BP meds Hypothyroidism: will restart home synthroid  with improved mental status HLD: restart home statin at discharge Pain: Hold home tramadol , Ordered x1 fentanyl   Vit D deficiency: continue to supplement, will need outpatient Vit D level checked . Portal venous thrombus: Found on admission, stable.  Patient GI follow-up. Urinary retention: Foley placed from 4/19 - 4/23.  Now voiding spontaneously.  To restart  Flomax  0.4 mg daily.

## 2023-12-15 NOTE — Plan of Care (Signed)

## 2023-12-15 NOTE — Assessment & Plan Note (Signed)
 Patient noted to be tachycardic on exam and telemetry.  She has not had VTE prophylaxis due to thrombocytopenia.  She is at high risk for clot.  Will order a CT PE to rule this out.  Her creatinine has been stable. - CT PE ordered - If we find PE we will need to talk to hematology to decide how to treat in light of her thrombocytopenia

## 2023-12-15 NOTE — Progress Notes (Addendum)
 Daily Progress Note Intern Pager: [phone]  Patient name: Brianna Wheeler Medical record number: [id number] Date of birth: [birth year] Age: 68 y.o. Gender: female  Primary Care Provider: Kandis Ormond, DO Consultants: None Code Status: DNR  Pt Overview and Major Events to Date:  5/19: Admitted  Assessment and Plan:  Brianna Wheeler is a 68 y.o. female presenting with AMS 2/2 to NASH liver cirrhosis.  Stable mental status this morning.  Patient did not take all of her lactulose  yesterday.  Received an enema yesterday due to not taking her oral lactulose .  Discussed with her this morning continuing her lactulose  4 times daily to have the appropriate number of bowel movements. Assessment & Plan AMS (altered mental status) Mental status continues to wax and wane. This morning mildly confused but is redirectable she is able to tell me the year her name and that she is in the hospital.  - Lactulose  to 4 times daily, goal BM x3 a day - avoid CNS acting medications - Fall precautions - SCDs for VTE prophylaxis, PLT less than 100,000 - Place purewick for urinary incontinence - Daily bladder scans to assess for urinary retention Tachycardia Patient noted to be tachycardic on exam and telemetry.  She has not had VTE prophylaxis due to thrombocytopenia.  She is at high risk for clot.  Will order a CT PE to rule this out.  Her creatinine has been stable. - CT PE ordered - If we find PE we will need to talk to hematology to decide how to treat in light of her thrombocytopenia Cirrhosis of liver without ascites (HCC)  Thrombocytopenia Stable and patient has remained afebrile.  Platelet still down trending, no signs of bleeding. Hgb stable - Continue home lactulose  - Fall precautions - Monitor routine vitals, fever curve Hypokalemia Potassium appropriate this morning.  Continue plan as below - Scheduled potassium 40 mEq twice daily  - Replete magnesium  with a goal of 2.0 or higher -  Continue Aldactone 50 mg, blood pressure tolerating - AM BMP and mag Anasarca Stable, continue to monitor - Continue torsemide to 10 mg - Continue Aldactone 50 mg Pancytopenia (HCC) Stable, continue to monitor - Continue close monitoring with daily CBC - Hold anticoagulants for VTE ppx with <100k PLT  AKI (acute kidney injury) (HCC) Stable.  Encourage p.o. hydration. Abdominal US  with bladder to ensure no urinary retention.  Suspect she may have a new creatinine baseline. - Avoid nephrotoxic agent - Daily BMP  Chronic health problem DMII: stable, continue CBG twice daily HTN: hypotensive, holding home BP meds Hypothyroidism: will restart home synthroid  with improved mental status HLD: restart home statin at discharge Pain: Hold home tramadol , Ordered x1 fentanyl   Vit D deficiency: continue to supplement, will need outpatient Vit D level checked . Portal venous thrombus: Found on admission, stable.  Patient GI follow-up. Urinary retention: Foley placed from 4/19 - 4/23.  Now voiding spontaneously.  To restart  Flomax  0.4 mg daily.  FEN/GI: Regular diet PPx: SCDs Dispo:SNF pending clinical improvement . Barriers include ongoing medical management.   Subjective:  No acute distress.  Denies being in pain.  Mildly confused but is redirectable and able to answer questions.  Objective: Temp:  [98.2 F (36.8 C)-100.1 F (37.8 C)] 98.7 F (37.1 C) (05/25 0741) Pulse Rate:  [89-117] 106 (05/25 0741) Resp:  [18] 18 (05/25 0741) BP: (96-119)/(53-58) 119/58 (05/25 0741) SpO2:  [95 %-100 %] 100 % (05/25 0741) Physical Exam: General: Chronically ill-appearing, no  acute distress Cardiovascular: Tachycardic, regular rhythm, no murmur on exam Respiratory: Clear, no increased work of breathing Abdomen: Distended, nontender Extremities: +2 pitting edema bilaterally  Laboratory: Most recent CBC Lab Results  Component Value Date   WBC 2.5 (L) 12/15/2023   HGB 10.0 (L) 12/15/2023   HCT  31.1 (L) 12/15/2023   MCV 103.3 (H) 12/15/2023   PLT 53 (L) 12/15/2023   Most recent BMP    Latest Ref Rng & Units 12/15/2023    3:07 AM  BMP  Glucose 70 - 99 mg/dL 098   BUN 8 - 23 mg/dL 13   Creatinine 1.19 - 1.00 mg/dL 1.47   Sodium 829 - 562 mmol/L 138   Potassium 3.5 - 5.1 mmol/L 3.7   Chloride 98 - 111 mmol/L 107   CO2 22 - 32 mmol/L 26   Calcium  8.9 - 10.3 mg/dL 8.2    Imaging/Diagnostic Tests: US  ascites: Scattered ascites and peritoneal cavity overall volume is likely small but somewhat difficult to evaluate due to patient's body habitus.  Clem Currier, DO 12/15/2023, 9:49 AM  PGY-2, [facility] FPTS Intern pager: [phone], text pages welcome Secure chat group [facility] Teaching Service

## 2023-12-15 NOTE — Assessment & Plan Note (Signed)
 Mental status continues to wax and wane. This morning mildly confused but is redirectable she is able to tell me the year her name and that she is in the hospital.  - Lactulose  to 4 times daily, goal BM x3 a day - avoid CNS acting medications - Fall precautions - SCDs for VTE prophylaxis, PLT less than 100,000 - Place purewick for urinary incontinence - Daily bladder scans to assess for urinary retention

## 2023-12-15 NOTE — Assessment & Plan Note (Signed)
 Stable, continue to monitor - Continue close monitoring with daily CBC - Hold anticoagulants for VTE ppx with <100k PLT

## 2023-12-15 NOTE — Assessment & Plan Note (Signed)
 Stable.  Encourage p.o. hydration. Abdominal US  with bladder to ensure no urinary retention.  Suspect she may have a new creatinine baseline. - Avoid nephrotoxic agent - Daily BMP

## 2023-12-15 NOTE — Plan of Care (Signed)

## 2023-12-15 NOTE — Assessment & Plan Note (Signed)
 Stable and patient has remained afebrile.  Platelet still down trending, no signs of bleeding. Hgb stable - Continue home lactulose  - Fall precautions - Monitor routine vitals, fever curve

## 2023-12-15 NOTE — Assessment & Plan Note (Signed)
 Stable, continue to monitor - Continue torsemide to 10 mg - Continue Aldactone 50 mg

## 2023-12-15 NOTE — Assessment & Plan Note (Signed)
 Potassium appropriate this morning.  Continue plan as below - Scheduled potassium 40 mEq twice daily  - Replete magnesium  with a goal of 2.0 or higher - Continue Aldactone 50 mg, blood pressure tolerating - AM BMP and mag

## 2023-12-16 ENCOUNTER — Inpatient Hospital Stay (HOSPITAL_COMMUNITY)

## 2023-12-16 LAB — COMPREHENSIVE METABOLIC PANEL WITH GFR
ALT: 41 U/L (ref 0–44)
AST: 96 U/L — ABNORMAL HIGH (ref 15–41)
Albumin: <1.5 g/dL — ABNORMAL LOW (ref 3.5–5.0)
Alkaline Phosphatase: 73 U/L (ref 38–126)
Anion gap: 4 — ABNORMAL LOW (ref 5–15)
BUN: 12 mg/dL (ref 8–23)
CO2: 24 mmol/L (ref 22–32)
Calcium: 7.7 mg/dL — ABNORMAL LOW (ref 8.9–10.3)
Chloride: 105 mmol/L (ref 98–111)
Creatinine, Ser: 1.08 mg/dL — ABNORMAL HIGH (ref 0.44–1.00)
GFR, Estimated: 56 mL/min — ABNORMAL LOW (ref >60)
Glucose, Bld: 96 mg/dL (ref 70–99)
Potassium: 4 mmol/L (ref 3.5–5.1)
Sodium: 133 mmol/L — ABNORMAL LOW (ref 135–145)
Total Bilirubin: 3.6 mg/dL — ABNORMAL HIGH (ref 0.0–1.2)
Total Protein: 5.9 g/dL — ABNORMAL LOW (ref 6.5–8.1)

## 2023-12-16 LAB — CBC
HCT: 30.9 % — ABNORMAL LOW (ref 36.0–46.0)
Hemoglobin: 10.1 g/dL — ABNORMAL LOW (ref 12.0–15.0)
MCH: 33.3 pg (ref 26.0–34.0)
MCHC: 32.7 g/dL (ref 30.0–36.0)
MCV: 102 fL — ABNORMAL HIGH (ref 80.0–100.0)
Platelets: 56 10*3/uL — ABNORMAL LOW (ref 150–400)
RBC: 3.03 MIL/uL — ABNORMAL LOW (ref 3.87–5.11)
RDW: 17.1 % — ABNORMAL HIGH (ref 11.5–15.5)
WBC: 4.5 10*3/uL (ref 4.0–10.5)
nRBC: 0.4 % — ABNORMAL HIGH (ref 0.0–0.2)

## 2023-12-16 LAB — PROTIME-INR
INR: 2.1 — ABNORMAL HIGH (ref 0.8–1.2)
Prothrombin Time: 24.2 s — ABNORMAL HIGH (ref 11.4–15.2)

## 2023-12-16 LAB — MAGNESIUM: Magnesium: 1.9 mg/dL (ref 1.7–2.4)

## 2023-12-16 MED ORDER — RIFAXIMIN 550 MG PO TABS
550.0000 mg | ORAL_TABLET | Freq: Two times a day (BID) | ORAL | Status: DC
  Administered 2023-12-16 – 2023-12-18 (×5): 550 mg via ORAL
  Filled 2023-12-16 (×6): qty 1

## 2023-12-16 MED ORDER — LACTULOSE 10 GM/15ML PO SOLN
30.0000 g | Freq: Four times a day (QID) | ORAL | Status: DC
Start: 2023-12-16 — End: 2023-12-17
  Administered 2023-12-16 – 2023-12-17 (×3): 30 g via ORAL
  Filled 2023-12-16 (×3): qty 45

## 2023-12-16 MED ORDER — IOHEXOL 350 MG/ML SOLN
75.0000 mL | Freq: Once | INTRAVENOUS | Status: AC | PRN
  Administered 2023-12-16: 75 mL via INTRAVENOUS

## 2023-12-16 MED ORDER — MAGNESIUM SULFATE IN D5W 1-5 GM/100ML-% IV SOLN
1.0000 g | Freq: Once | INTRAVENOUS | Status: AC
  Administered 2023-12-16: 1 g via INTRAVENOUS
  Filled 2023-12-16: qty 100

## 2023-12-16 NOTE — Assessment & Plan Note (Addendum)
 Patient noted to be tachycardic on exam and telemetry.  CTA PE negative.  Her creatinine has been stable. Montior for SBP including fever.  - follow up EKG - Monitor fever curve for SBP

## 2023-12-16 NOTE — Assessment & Plan Note (Addendum)
 DMII: stable, continue CBG twice daily HTN: hypotensive, holding home BP meds Hypothyroidism: will restart home synthroid  with improved mental status HLD: restart home statin at discharge Pain: Hold home tramadol , Ordered x1 fentanyl   Vit D deficiency: continue to supplement, will need outpatient Vit D level checked . Portal venous thrombus: Found on admission, stable.  Patient GI follow-up. Urinary retention: Foley placed from 4/19 - 4/23.  Now voiding spontaneously.  To restart  Flomax  0.4 mg daily.

## 2023-12-16 NOTE — TOC Progression Note (Signed)
 Transition of Care [facility]) - Progression Note    Patient Details  Name: Brianna Wheeler MRN: [id number] Date of Birth: [birth year]  Transition of Care [facility]) CM/SW Contact  Arron Big, [place] Phone Number: 12/16/2023, 11:07 AM  Clinical Narrative:   CSW will need updated PT note to submit for insurance auth.   TOC will continue to follow.    Expected Discharge Plan: Skilled Nursing Facility Barriers to Discharge: Continued Medical Work up  Expected Discharge Plan and Services In-house Referral: Clinical Social Work Discharge Planning Services: CM Consult   Living arrangements for the past 2 months: Single Family Home                                       Social Determinants of Health (SDOH) Interventions SDOH Screenings   Food Insecurity: No Food Insecurity (12/10/2023)  Housing: Low Risk  (12/10/2023)  Transportation Needs: No Transportation Needs (12/10/2023)  Utilities: Not At Risk (12/10/2023)  Alcohol Screen: Low Risk  (09/30/2023)  Depression (PHQ2-9): Low Risk  (09/30/2023)  Financial Resource Strain: Low Risk  (09/30/2023)  Physical Activity: Insufficiently Active (09/30/2023)  Social Connections: Socially Isolated (12/10/2023)  Stress: No Stress Concern Present (09/30/2023)  Tobacco Use: Low Risk  (12/10/2023)  Health Literacy: Adequate Health Literacy (09/30/2023)    Readmission Risk Interventions    11/25/2023    1:00 PM  Readmission Risk Prevention Plan  Transportation Screening Complete  Palliative Care Screening Not Applicable  Medication Review (RN Care Manager) Complete

## 2023-12-16 NOTE — Assessment & Plan Note (Addendum)
 1.9 today.  Gave 1 g - Continue daily magnesium  level

## 2023-12-16 NOTE — Assessment & Plan Note (Addendum)
 Pancytopenia stable.  - Continue home lactulose  - Fall precautions - Monitor routine vitals, fever curve

## 2023-12-16 NOTE — Assessment & Plan Note (Deleted)
 Stable, continue to monitor - Continue close monitoring with daily CBC - Hold anticoagulants for VTE ppx with <100k PLT

## 2023-12-16 NOTE — Assessment & Plan Note (Addendum)
 Mental status continues to wax and wane. Today oriented to person, place, not time. Attention poor.  MELD 26.  - Lactulose  to 4 times daily, goal BM x3 a day - Start rifaximin 550 twice daily for hepatic encephalopathy  - monitor for worsening edema as common side effect - avoid CNS acting medications - Fall precautions - SCDs for VTE prophylaxis, PLT less than 100,000 - Place purewick for urinary incontinence - Daily bladder scans to assess for urinary retention

## 2023-12-16 NOTE — Assessment & Plan Note (Addendum)
 Stable 4.0 today. - Scheduled potassium 40 mEq twice daily  - Replete magnesium  with a goal of 2.0 or higher - Continue Aldactone 50 mg, blood pressure tolerating - AM BMP and mag

## 2023-12-16 NOTE — Assessment & Plan Note (Addendum)
 Stable, continue to monitor - Continue torsemide to 10 mg - Continue Aldactone 50 mg

## 2023-12-16 NOTE — Progress Notes (Signed)
 Daily Progress Note Intern Pager: [phone]  Patient name: Brianna Wheeler Medical record number: [id number] Date of birth: 09-15-1955 Age: 68 y.o. Gender: female  Primary Care Provider: Kandis Ormond, DO Consultants: none Code Status: DNR, DNI  Pt Overview and Major Events to Date:  Brianna Wheeler is a 68 y.o. female presenting with AMS 2/2 to NASH liver cirrhosis.   Assessment and Plan: Mentation improved from the weekend. Adding rifaxamin given her MELD 26 suggesting 1/4 mortality in next 12 months and persistent hyperammonemia and AMS. Needs continuing monitoring for tachycardia and further improvement of mentation and will be stable to return back to SNF in hopefully next couple of days.  Assessment & Plan AMS (altered mental status) Mental status continues to wax and wane. Today oriented to person, place, not time. Attention poor.  MELD 26.  - Lactulose  to 4 times daily, goal BM x3 a day - Start rifaximin 550 twice daily for hepatic encephalopathy  - monitor for worsening edema as common side effect - avoid CNS acting medications - Fall precautions - SCDs for VTE prophylaxis, PLT less than 100,000 - Place purewick for urinary incontinence - Daily bladder scans to assess for urinary retention Tachycardia Patient noted to be tachycardic on exam and telemetry.  CTA PE negative.  Her creatinine has been stable. Montior for SBP including fever.  - follow up EKG - Monitor fever curve for SBP Cirrhosis of liver without ascites (HCC)  Thrombocytopenia Pancytopenia stable.  - Continue home lactulose  - Fall precautions - Monitor routine vitals, fever curve Hypokalemia Stable 4.0 today. - Scheduled potassium 40 mEq twice daily  - Replete magnesium  with a goal of 2.0 or higher - Continue Aldactone 50 mg, blood pressure tolerating - AM BMP and mag Anasarca Stable, continue to monitor - Continue torsemide to 10 mg - Continue Aldactone 50 mg AKI (acute kidney injury)  (HCC) (Resolved: 12/16/2023) Stable.  Encourage p.o. hydration. Abdominal US  with bladder to ensure no urinary retention.  Suspect she may have a new creatinine baseline. - Avoid nephrotoxic agent - Daily BMP Hypomagnesemia 1.9 today.  Gave 1 g - Continue daily magnesium  level Chronic health problem DMII: stable, continue CBG twice daily HTN: hypotensive, holding home BP meds Hypothyroidism: will restart home synthroid  with improved mental status HLD: restart home statin at discharge Pain: Hold home tramadol , Ordered x1 fentanyl   Vit D deficiency: continue to supplement, will need outpatient Vit D level checked . Portal venous thrombus: Found on admission, stable.  Patient GI follow-up. Urinary retention: Foley placed from 4/19 - 4/23.  Now voiding spontaneously.  To restart  Flomax  0.4 mg daily.   FEN/GI: regular diet, peripheral IV x 1 day. External urinary catheter.   PPx: SCDs Dispo:SNF in 1-2 day hopefully. Barriers include AMS and tachycardia.   Subjective:  Unable to provide history today.  Oriented to person, place but not time.  Nursing is unsure how many bowel movements she has had.  Discussed that goal is 3.  Objective: Temp:  [98.2 F (36.8 C)-98.9 F (37.2 C)] 98.6 F (37 C) (05/26 0320) Pulse Rate:  [109-122] 109 (05/26 0320) Resp:  [17-19] 18 (05/26 0320) BP: (107-131)/(50-64) 123/62 (05/26 0320) SpO2:  [93 %-99 %] 95 % (05/26 0320) Weight:  [139.2 kg] 139.2 kg (05/26 0320) Physical Exam: General: Calm, cooperative, not alert, somnolent but arousable to voice, not in acute distress Cardiovascular: Borderline tachycardic, normal rhythm.  Systolic murmur. Respiratory: Normal effort.  Clear to auscultation listening from  the front. Abdomen: Soft and nontender but somnolent.  Diffuse ascites but does not appear changed from previous days. Extremities: Bilateral lower extremity edema  Laboratory: Most recent CBC Lab Results  Component Value Date   WBC 4.5  12/16/2023   HGB 10.1 (L) 12/16/2023   HCT 30.9 (L) 12/16/2023   MCV 102.0 (H) 12/16/2023   PLT 56 (L) 12/16/2023   Most recent BMP    Latest Ref Rng & Units 12/16/2023    2:59 AM  BMP  Glucose 70 - 99 mg/dL 96   BUN 8 - 23 mg/dL 12   Creatinine 3.08 - 1.00 mg/dL 6.57   Sodium 846 - 962 mmol/L 133   Potassium 3.5 - 5.1 mmol/L 4.0   Chloride 98 - 111 mmol/L 105   CO2 22 - 32 mmol/L 24   Calcium  8.9 - 10.3 mg/dL 7.7    Mag: 1.9  Verdell Given, MD 12/16/2023, 11:19 AM  PGY-1, [facility] FPTS Intern pager: [phone], text pages welcome Secure chat group [facility] Teaching Service

## 2023-12-16 NOTE — Assessment & Plan Note (Signed)
 Stable.  Encourage p.o. hydration. Abdominal US  with bladder to ensure no urinary retention.  Suspect she may have a new creatinine baseline. - Avoid nephrotoxic agent - Daily BMP

## 2023-12-17 ENCOUNTER — Telehealth (HOSPITAL_COMMUNITY): Payer: Self-pay | Admitting: Pharmacy Technician

## 2023-12-17 ENCOUNTER — Telehealth (HOSPITAL_COMMUNITY): Payer: Self-pay

## 2023-12-17 ENCOUNTER — Other Ambulatory Visit (HOSPITAL_COMMUNITY): Payer: Self-pay

## 2023-12-17 LAB — COMPREHENSIVE METABOLIC PANEL WITH GFR
ALT: 40 U/L (ref 0–44)
AST: 90 U/L — ABNORMAL HIGH (ref 15–41)
Albumin: <1.5 g/dL — ABNORMAL LOW (ref 3.5–5.0)
Alkaline Phosphatase: 81 U/L (ref 38–126)
Anion gap: 4 — ABNORMAL LOW (ref 5–15)
BUN: 10 mg/dL (ref 8–23)
CO2: 24 mmol/L (ref 22–32)
Calcium: 7.5 mg/dL — ABNORMAL LOW (ref 8.9–10.3)
Chloride: 106 mmol/L (ref 98–111)
Creatinine, Ser: 0.92 mg/dL (ref 0.44–1.00)
GFR, Estimated: >60 mL/min (ref >60)
Glucose, Bld: 102 mg/dL — ABNORMAL HIGH (ref 70–99)
Potassium: 3.7 mmol/L (ref 3.5–5.1)
Sodium: 134 mmol/L — ABNORMAL LOW (ref 135–145)
Total Bilirubin: 3.4 mg/dL — ABNORMAL HIGH (ref 0.0–1.2)
Total Protein: 5.5 g/dL — ABNORMAL LOW (ref 6.5–8.1)

## 2023-12-17 LAB — CBC
HCT: 30.8 % — ABNORMAL LOW (ref 36.0–46.0)
Hemoglobin: 10 g/dL — ABNORMAL LOW (ref 12.0–15.0)
MCH: 33 pg (ref 26.0–34.0)
MCHC: 32.5 g/dL (ref 30.0–36.0)
MCV: 101.7 fL — ABNORMAL HIGH (ref 80.0–100.0)
Platelets: 54 10*3/uL — ABNORMAL LOW (ref 150–400)
RBC: 3.03 MIL/uL — ABNORMAL LOW (ref 3.87–5.11)
RDW: 17.3 % — ABNORMAL HIGH (ref 11.5–15.5)
WBC: 3.9 10*3/uL — ABNORMAL LOW (ref 4.0–10.5)
nRBC: 0 % (ref 0.0–0.2)

## 2023-12-17 LAB — MISC LABCORP TEST (SEND OUT): Labcorp test code: 1024

## 2023-12-17 LAB — MAGNESIUM: Magnesium: 1.9 mg/dL (ref 1.7–2.4)

## 2023-12-17 MED ORDER — MAGNESIUM SULFATE IN D5W 1-5 GM/100ML-% IV SOLN
1.0000 g | Freq: Once | INTRAVENOUS | Status: AC
  Administered 2023-12-17: 1 g via INTRAVENOUS
  Filled 2023-12-17: qty 100

## 2023-12-17 MED ORDER — LACTULOSE 10 GM/15ML PO SOLN
40.0000 g | Freq: Four times a day (QID) | ORAL | Status: DC
  Administered 2023-12-17 – 2023-12-20 (×15): 40 g via ORAL
  Filled 2023-12-17 (×15): qty 60

## 2023-12-17 NOTE — Progress Notes (Signed)
 Daily Progress Note Intern Pager: [phone]  Patient name: Brianna Wheeler Medical record number: [id number] Date of birth: 07/21/1956 Age: 68 y.o. Gender: female  Primary Care Provider: Kandis Ormond, DO Consultants: none Code Status: DNR, DNI  Pt Overview and Major Events to Date:  Mentation improved from the weekend. Adding rifaxamin given her MELD 26 suggesting 1/4 mortality in next 12 months and persistent hyperammonemia and AMS. Needs continuing monitoring for tachycardia and further improvement of mentation and will be stable to return back to SNF in hopefully next couple of days.   Assessment and Plan:  Mental status unchanged / worsening so we will .  Assessment & Plan AMS (altered mental status) Mental status unchanged / wosrening. Increased lactulose  yesterday and had 1 BM per secure chat and no other BMs per chart.  - Increased lactulose  from 30->40 g four times daily - goal 3 BMs today - reassess for BM in afternoon. If no, will give lactulose  enema - Continue rifaximin 550 twice daily for hepatic encephalopathy  - monitor for worsening edema as common side effect - avoid CNS acting medications - Fall precautions - SCDs for VTE prophylaxis, PLT less than 100,000 - Place purewick for urinary incontinence Tachycardia CTA PE 5/26 negative. Sinus tach on EKG 5/26. Improved in last 12+ hours.  - Monitor fever curve for SBP Cirrhosis of liver without ascites (HCC) Pancytopenia stable.  - Continue home lactulose  - Fall precautions - Monitor routine vitals, fever curve Pancytopenia (HCC) Stable, continue to monitor - Continue close monitoring with daily CBC - Hold anticoagulants for VTE ppx with <100k PLT  Anasarca Stable, continue to monitor - Continue torsemide to 10 mg - Continue Aldactone 50 mg Hypokalemia Stable 3.7 today.  - Scheduled potassium 40 mEq twice daily  - Replete magnesium  with a goal of 2.0 or higher - Continue Aldactone 50 mg, blood  pressure tolerating - AM BMP and mag Hypomagnesemia 1.9 today. Will give 1 g IV.  - Continue daily magnesium  level Chronic health problem DMII: stable, continue CBG twice daily HTN: hypotensive, holding home BP meds Hypothyroidism: will restart home synthroid  with improved mental status HLD: restart home statin at discharge Pain: Hold home tramadol , Ordered x1 fentanyl   Vit D deficiency: continue to supplement, will need outpatient Vit D level checked . Portal venous thrombus: Found on admission, stable.  Patient GI follow-up. Urinary retention: Foley placed from 4/19 - 4/23.  Now voiding spontaneously.  To restart  Flomax  0.4 mg daily.   FEN/GI: regular diet, peripheral IV x 1 PPx: SCD, platelets <100 Dispo:SNF pending clinical improvement . Barriers include mental status improvement and continued resolution of her tachycardia.   Subjective:  Per RN, "her mentation is about the same as yesterday but she's a bit more sleepy and responses are slightly more delayed I would say. She has not had any more BM's other than the small one yesterday."  Pt does not interact significnat. Oriented to person and place but not time or situation.   Objective: Temp:  [98.2 F (36.8 C)-98.5 F (36.9 C)] 98.3 F (36.8 C) (05/27 1140) Pulse Rate:  [83-98] 97 (05/27 0750) Resp:  [18-20] 19 (05/27 1140) BP: (105-132)/(49-66) 114/61 (05/27 1140) SpO2:  [97 %-98 %] 97 % (05/27 1140) Weight:  [139.2 kg] 139.2 kg (05/27 0324) Physical Exam: General: somnolent, not ill appearing, poor alertness but is arousable to voice and easily to touch, does not coversation much due to impaired attention, follows minimal commands Cardiovascular: normal  rate and rhythm. Systolic murmur 1/6 or diminished sound due to body habitus.  Respiratory: normal effort while laying. Pulm exam limited due to patient being unable to lean forward and difficult to hear breath sounds ausculating from front.  Abdomen: distended from  fluid but not significant changed. No tenderness to palpation.  Extremities: continued extremity edema but not significant changed  Laboratory: Most recent CBC Lab Results  Component Value Date   WBC 3.9 (L) 12/17/2023   HGB 10.0 (L) 12/17/2023   HCT 30.8 (L) 12/17/2023   MCV 101.7 (H) 12/17/2023   PLT 54 (L) 12/17/2023   Most recent BMP    Latest Ref Rng & Units 12/17/2023    6:20 AM  BMP  Glucose 70 - 99 mg/dL 161   BUN 8 - 23 mg/dL 10   Creatinine 0.96 - 1.00 mg/dL 0.45   Sodium 409 - 811 mmol/L 134   Potassium 3.5 - 5.1 mmol/L 3.7   Chloride 98 - 111 mmol/L 106   CO2 22 - 32 mmol/L 24   Calcium  8.9 - 10.3 mg/dL 7.5      Verdell Given, MD 12/17/2023, 1:05 PM  PGY-1, [facility] FPTS Intern pager: [phone], text pages welcome Secure chat group [facility] Teaching Service

## 2023-12-17 NOTE — Assessment & Plan Note (Signed)
 CTA PE 5/26 negative. Sinus tach on EKG 5/26. Improved in last 12+ hours.  - Monitor fever curve for SBP

## 2023-12-17 NOTE — Plan of Care (Signed)

## 2023-12-17 NOTE — Progress Notes (Addendum)
 This chaplain responded to PMT NP-Kasie consult for naming the Pt. Brianna Wheeler as her HCPOA. The Pt. is awake and communicative with the chaplain.   The chaplain understands the Pt. is waiting on PT to help her regain the strength in her arms before she returns to [place]. The chaplain understands the Pt. wants to know more about the medication she is receiving. The Pt. shares with the chaplain the medicine she is receiving contributes to her lack of clarity.  The Pt. indicated her choice of HCPOA is [employer] and [employer]. The Pt. was not interested in completing an AD because she thinks she already has an AD. The chaplain understands from the visit the Pt. doesn't have a spouse or additional children to act as medical decision makers.  This chaplain will partner with PMT to determine best next steps in completing the Pt. AD.  Chaplain Kathleene Papas [phone]

## 2023-12-17 NOTE — Assessment & Plan Note (Signed)
 1.9 today. Will give 1 g IV.  - Continue daily magnesium  level

## 2023-12-17 NOTE — Telephone Encounter (Signed)
 Pharmacy Patient Advocate Encounter   Received notification from Inpatient Request that prior authorization for Xifaxan 550MG  tablets is required/requested.   Insurance verification completed.   The patient is insured through [employer] .   Per test claim: PA required; PA submitted to above mentioned insurance via CoverMyMeds Key/confirmation #/EOC [facility] Status is pending

## 2023-12-17 NOTE — Assessment & Plan Note (Addendum)
 Stable, continue to monitor - Continue torsemide to 10 mg - Continue Aldactone 50 mg

## 2023-12-17 NOTE — Assessment & Plan Note (Signed)
 Stable 3.7 today.  - Scheduled potassium 40 mEq twice daily  - Replete magnesium  with a goal of 2.0 or higher - Continue Aldactone 50 mg, blood pressure tolerating - AM BMP and mag

## 2023-12-17 NOTE — Telephone Encounter (Signed)
 Pharmacy Patient Advocate Encounter  Received notification from [facility] that Prior Authorization for Xifaxan 550MG  tablets has been APPROVED from 12/17/2023 to 07/22/2024. Ran test claim, Copay is $1,221.65. This test claim was processed through [facility]- copay amounts may vary at other pharmacies due to pharmacy/plan contracts, or as the patient moves through the different stages of their insurance plan.   PA #/Case ID/Reference #: [id number]

## 2023-12-17 NOTE — Assessment & Plan Note (Addendum)
 Mental status unchanged / wosrening. Increased lactulose  yesterday and had 1 BM per secure chat and no other BMs per chart.  - Increased lactulose  from 30->40 g four times daily - goal 3 BMs today - reassess for BM in afternoon. If no, will give lactulose  enema - Continue rifaximin 550 twice daily for hepatic encephalopathy  - monitor for worsening edema as common side effect - avoid CNS acting medications - Fall precautions - SCDs for VTE prophylaxis, PLT less than 100,000 - Place purewick for urinary incontinence

## 2023-12-17 NOTE — Progress Notes (Signed)
 Physical Therapy Treatment Patient Details Name: Brianna Wheeler MRN: [id number] DOB: 10/05/1955 Today's Date: 12/17/2023   History of Present Illness 68 yo patient presenting with AMS 2/2 NASH cirrhosis. She has a PMH of T2DM, hypertension, hypothyroid, HLD, portal venous thrombosis urinary retention.    PT Comments  Patient progressing slowly.  Able to sit EOB to dangle though not able to maintain balance.  Working on 10 second holds x 3 though drifts back and to R.  Patient reports new that she is unable to move her arms.  Unsure this is related to her hyperammoniemia.  Feel she will benefit from continued skilled PT in the acute setting and return to [facility] for rehab at d/c.     If plan is discharge home, recommend the following: Two people to help with walking and/or transfers;Two people to help with bathing/dressing/bathroom;Assistance with cooking/housework;Assistance with feeding;Direct supervision/assist for medications management;Direct supervision/assist for financial management;Assist for transportation;Help with stairs or ramp for entrance;Supervision due to cognitive status   Can travel by private vehicle     No  Equipment Recommendations  Hospital bed    Recommendations for Other Services       Precautions / Restrictions Precautions Precautions: Fall Recall of Precautions/Restrictions: Impaired Precaution/Restrictions Comments: L eye visual deficits     Mobility  Bed Mobility Overal bed mobility: Needs Assistance Bed Mobility: Rolling, Sidelying to Sit, Sit to Supine Rolling: Total assist Sidelying to sit: Max assist   Sit to supine: +2 for physical assistance, Total assist   General bed mobility comments: up to EOB assist for legs off EOB and only min A to lift trunk; to supine A from nursing for legs and shoulders and to reposition    Transfers                   General transfer comment: NT no +2 available and poor sitting balance     Ambulation/Gait                   Stairs             Wheelchair Mobility     Tilt Bed    Modified Rankin (Stroke Patients Only)       Balance Overall balance assessment: Needs assistance Sitting-balance support: Feet supported Sitting balance-[name]: Poor Sitting balance - Comments: kept falling back and to L; cues for forward and A but practice 10 seconds x 3 without physical assist pt drifting back and to R cues for using core vs head to assist with regaining balance as only limited UE use       Standing balance comment: NT                            Communication Communication Communication: No apparent difficulties  Cognition Arousal: Alert Behavior During Therapy: WFL for tasks assessed/performed   PT - Cognitive impairments: Problem solving, Attention, Initiation                       PT - Cognition Comments: slow to initiate movement though able to move some, sustained attention Following commands: Impaired Following commands impaired: Follows one step commands with increased time    Cueing Cueing Techniques: Verbal cues, Gestural cues, Tactile cues, Visual cues  Exercises General Exercises - Upper Extremity Shoulder Flexion: PROM, Both, 5 reps, Supine Elbow Flexion: PROM, Both, 5 reps, Supine Elbow Extension: PROM, Both, 5 reps, Supine Wrist  Extension: PROM, Both, 5 reps Digit Composite Flexion: AAROM, Right    General Comments General comments (skin integrity, edema, etc.): VSS on RA, RN assessed and replaced sacral foam and noted small BM      Pertinent Vitals/Pain Pain Assessment Pain Assessment: Faces Faces Pain Scale: No hurt    Home Living                          Prior Function            PT Goals (current goals can now be found in the care plan section) Progress towards PT goals: Progressing toward goals    Frequency    Min 1X/week      PT Plan      Co-evaluation               AM-PAC PT "6 Clicks" Mobility   Outcome Measure  Help needed turning from your back to your side while in a flat bed without using bedrails?: Total Help needed moving from lying on your back to sitting on the side of a flat bed without using bedrails?: Total Help needed moving to and from a bed to a chair (including a wheelchair)?: Total Help needed standing up from a chair using your arms (e.g., wheelchair or bedside chair)?: Total Help needed to walk in hospital room?: Total Help needed climbing 3-5 steps with a railing? : Total 6 Click Score: 6    End of Session   Activity Tolerance: Patient limited by fatigue Patient left: in bed;with nursing/sitter in room   PT Visit Diagnosis: Adult, failure to thrive (R62.7);Muscle weakness (generalized) (M62.81);Difficulty in walking, not elsewhere classified (R26.2)     Time: 4098-1191 PT Time Calculation (min) (ACUTE ONLY): 35 min  Charges:    $Therapeutic Activity: 23-37 mins PT General Charges $$ ACUTE PT VISIT: 1 Visit                     Abigail Hoff, PT Acute Rehabilitation Services Office:[phone] 12/17/2023    Brianna Wheeler 12/17/2023, 4:55 PM

## 2023-12-17 NOTE — Assessment & Plan Note (Addendum)
 Stable, continue to monitor - Continue close monitoring with daily CBC - Hold anticoagulants for VTE ppx with <100k PLT

## 2023-12-17 NOTE — Assessment & Plan Note (Addendum)
 Pancytopenia stable.  - Continue home lactulose  - Fall precautions - Monitor routine vitals, fever curve

## 2023-12-17 NOTE — Telephone Encounter (Signed)
 Pharmacy Patient Advocate Encounter  Insurance verification completed.    The patient is insured through [facility]. Patient has Medicare and is not eligible for a copay card, but may be able to apply for patient assistance or Medicare RX Payment Plan (Patient Must reach out to their plan, if eligible for payment plan), if available.    Ran test claim for Xifaxan and the current 30 day co-pay is PA REQUIRED.   This test claim was processed through [facility]- copay amounts may vary at other pharmacies due to pharmacy/plan contracts, or as the patient moves through the different stages of their insurance plan.

## 2023-12-17 NOTE — Assessment & Plan Note (Addendum)
 DMII: stable, continue CBG twice daily HTN: hypotensive, holding home BP meds Hypothyroidism: will restart home synthroid  with improved mental status HLD: restart home statin at discharge Pain: Hold home tramadol , Ordered x1 fentanyl   Vit D deficiency: continue to supplement, will need outpatient Vit D level checked . Portal venous thrombus: Found on admission, stable.  Patient GI follow-up. Urinary retention: Foley placed from 4/19 - 4/23.  Now voiding spontaneously.  To restart  Flomax  0.4 mg daily.

## 2023-12-18 ENCOUNTER — Other Ambulatory Visit (HOSPITAL_COMMUNITY): Payer: Self-pay

## 2023-12-18 LAB — BASIC METABOLIC PANEL WITH GFR
Anion gap: 6 (ref 5–15)
BUN: 10 mg/dL (ref 8–23)
CO2: 22 mmol/L (ref 22–32)
Calcium: 8.1 mg/dL — ABNORMAL LOW (ref 8.9–10.3)
Chloride: 106 mmol/L (ref 98–111)
Creatinine, Ser: 0.83 mg/dL (ref 0.44–1.00)
GFR, Estimated: >60 mL/min (ref >60)
Glucose, Bld: 107 mg/dL — ABNORMAL HIGH (ref 70–99)
Potassium: 3.7 mmol/L (ref 3.5–5.1)
Sodium: 134 mmol/L — ABNORMAL LOW (ref 135–145)

## 2023-12-18 LAB — CBC
HCT: 33.3 % — ABNORMAL LOW (ref 36.0–46.0)
Hemoglobin: 11 g/dL — ABNORMAL LOW (ref 12.0–15.0)
MCH: 33.7 pg (ref 26.0–34.0)
MCHC: 33 g/dL (ref 30.0–36.0)
MCV: 102.1 fL — ABNORMAL HIGH (ref 80.0–100.0)
Platelets: 68 10*3/uL — ABNORMAL LOW (ref 150–400)
RBC: 3.26 MIL/uL — ABNORMAL LOW (ref 3.87–5.11)
RDW: 17.5 % — ABNORMAL HIGH (ref 11.5–15.5)
WBC: 4.6 10*3/uL (ref 4.0–10.5)
nRBC: 0 % (ref 0.0–0.2)

## 2023-12-18 LAB — MAGNESIUM: Magnesium: 2 mg/dL (ref 1.7–2.4)

## 2023-12-18 MED ORDER — LIDOCAINE 5 % EX PTCH
1.0000 | MEDICATED_PATCH | Freq: Every day | CUTANEOUS | 0 refills | Status: DC
  Filled 2023-12-18: qty 30, 30d supply, fill #0

## 2023-12-18 MED ORDER — SPIRONOLACTONE 50 MG PO TABS: 50.0000 mg | ORAL_TABLET | Freq: Every day | ORAL | Status: DC

## 2023-12-18 MED ORDER — ONDANSETRON 4 MG PO TBDP
4.0000 mg | ORAL_TABLET | Freq: Once | ORAL | Status: AC
  Administered 2023-12-18: 4 mg via ORAL
  Filled 2023-12-18: qty 1

## 2023-12-18 MED ORDER — LACTULOSE 10 GM/15ML PO SOLN
40.0000 g | Freq: Four times a day (QID) | ORAL | 0 refills | Status: DC
  Filled 2023-12-18: qty 236, 1d supply, fill #0

## 2023-12-18 MED ORDER — POTASSIUM CHLORIDE CRYS ER 20 MEQ PO TBCR: 40.0000 meq | EXTENDED_RELEASE_TABLET | Freq: Two times a day (BID) | ORAL | Status: DC

## 2023-12-18 MED ORDER — TORSEMIDE 10 MG PO TABS: 10.0000 mg | ORAL_TABLET | Freq: Every day | ORAL | Status: DC

## 2023-12-18 NOTE — Progress Notes (Signed)
 Occupational Therapy Treatment Patient Details Name: Brianna Wheeler MRN: [id number] DOB: 05-22-1956 Today's Date: 12/18/2023   History of present illness 68 yo patient presenting with AMS 2/2 NASH cirrhosis. She has a PMH of T2DM, hypertension, hypothyroid, HLD, portal venous thrombosis urinary retention.   OT comments  Pt alert and talking with increased time to formulate statements and questions. Pt does not appear to appreciate the gravity of her illness. Pt able to bring cup with straw to her mouth to drink with pillow under elbow for support and placement of cup in her hand. Hand over hand assist needed for self feeding with a spoon. Pt unable to chew and swallow bacon, no difficulty with grits, applesauce and drinks. +2 total for bed mobility.       If plan is discharge home, recommend the following:  Two people to help with walking and/or transfers;Two people to help with bathing/dressing/bathroom;Assistance with cooking/housework;Direct supervision/assist for medications management;Direct supervision/assist for financial management;Assist for transportation;Help with stairs or ramp for entrance   Equipment Recommendations  Hospital bed;Hoyer lift    Recommendations for Other Services      Precautions / Restrictions Precautions Precautions: Fall Recall of Precautions/Restrictions: Impaired Precaution/Restrictions Comments: L eye visual deficits Restrictions Weight Bearing Restrictions Per Provider Order: No       Mobility Bed Mobility               General bed mobility comments: +2 total assist to pull up in bed prior to eating    Transfers                         Balance                                           ADL either performed or assessed with clinical judgement   ADL Overall ADL's : Needs assistance/impaired Eating/Feeding: Total assistance;Bed level Eating/Feeding Details (indicate cue type and reason): hand over hand  assist with built up spoon to self feed grits and applesauce, able to bring cup with straw to mouth to drink with R hand, pillow under R elbow Grooming: Wash/dry hands;Wash/dry face;Total assistance;Bed level                                      Extremity/Trunk Assessment Upper Extremity Assessment Upper Extremity Assessment: Generalized weakness;Right hand dominant            Vision   Additional Comments: blind in L eye   Perception     Praxis     Communication Communication Factors Affecting Communication: Difficulty expressing self (slow to formulate statements, questions)   Cognition Arousal: Alert Behavior During Therapy: Flat affect Cognition: Cognition impaired   Orientation impairments: Time, Situation Awareness: Intellectual awareness impaired, Online awareness impaired Memory impairment (select all impairments): Short-term memory, Declarative long-term memory, Working memory, Non-declarative long-term memory Attention impairment (select first level of impairment): Selective attention Executive functioning impairment (select all impairments): Organization, Reasoning, Problem solving OT - Cognition Comments: more verbal, does not appear to appreciate the gravity of her illness                 Following commands: Impaired Following commands impaired: Follows one step commands with increased time      Cueing   Cueing  Techniques: Verbal cues, Tactile cues  Exercises General Exercises - Upper Extremity Shoulder Flexion: PROM, Both, 5 reps, Supine Shoulder Horizontal ADduction: Both, Supine, PROM, 5 reps Elbow Flexion: PROM, Both, 5 reps, Supine Elbow Extension: PROM, Both, 5 reps, Supine    Shoulder Instructions       General Comments      Pertinent Vitals/ Pain       Pain Assessment Pain Assessment: Faces Faces Pain Scale: No hurt  Home Living                                          Prior  Functioning/Environment              Frequency  Min 1X/week        Progress Toward Goals  OT Goals(current goals can now be found in the care plan section)  Progress towards OT goals: Progressing toward goals  Acute Rehab OT Goals OT Goal Formulation: Patient unable to participate in goal setting Time For Goal Achievement: 12/28/23 Potential to Achieve Goals: Fair  Plan      Co-evaluation                 AM-PAC OT "6 Clicks" Daily Activity     Outcome Measure   Help from another person eating meals?: Total Help from another person taking care of personal grooming?: Total Help from another person toileting, which includes using toliet, bedpan, or urinal?: Total Help from another person bathing (including washing, rinsing, drying)?: Total Help from another person to put on and taking off regular upper body clothing?: Total Help from another person to put on and taking off regular lower body clothing?: Total 6 Click Score: 6    End of Session    OT Visit Diagnosis: Muscle weakness (generalized) (M62.81);Other symptoms and signs involving cognitive function;Pain   Activity Tolerance Patient tolerated treatment well   Patient Left in bed;with call bell/phone within reach;with bed alarm set;with nursing/sitter in room   Nurse Communication          Time: 1610-9604 OT Time Calculation (min): 16 min  Charges:   1 [place]  Avanell Leigh, OTR/L Acute Rehabilitation Services Office: [phone]   Jonette Nestle 12/18/2023, 10:02 AM

## 2023-12-18 NOTE — TOC Progression Note (Addendum)
 Transition of Care [facility]) - Progression Note    Patient Details  Name: Brianna Wheeler MRN: [id number] Date of Birth: May 22, 1956  Transition of Care [facility]) CM/SW Contact  Arron Big, [place] Phone Number: 12/18/2023, 11:24 AM  Clinical Narrative:   CSW submitted insurance pre auth at this time for [facility]. Auth id [id number].   1:16 PM Auth pending.   TOC will continue to follow.    Expected Discharge Plan: Skilled Nursing Facility Barriers to Discharge: Continued Medical Work up, English as a second language teacher  Expected Discharge Plan and Services In-house Referral: Clinical Social Work Discharge Planning Services: CM Consult   Living arrangements for the past 2 months: Single Family Home                                       Social Determinants of Health (SDOH) Interventions SDOH Screenings   Food Insecurity: No Food Insecurity (12/10/2023)  Housing: Low Risk  (12/10/2023)  Transportation Needs: No Transportation Needs (12/10/2023)  Utilities: Not At Risk (12/10/2023)  Alcohol Screen: Low Risk  (09/30/2023)  Depression (PHQ2-9): Low Risk  (09/30/2023)  Financial Resource Strain: Low Risk  (09/30/2023)  Physical Activity: Insufficiently Active (09/30/2023)  Social Connections: Socially Isolated (12/10/2023)  Stress: No Stress Concern Present (09/30/2023)  Tobacco Use: Low Risk  (12/10/2023)  Health Literacy: Adequate Health Literacy (09/30/2023)    Readmission Risk Interventions    11/25/2023    1:00 PM  Readmission Risk Prevention Plan  Transportation Screening Complete  Palliative Care Screening Not Applicable  Medication Review (RN Care Manager) Complete

## 2023-12-18 NOTE — Discharge Summary (Shared)
 ***date/time*** Family Medicine Teaching Service [facility] Discharge Summary  Patient name: Brianna Wheeler Medical record number: [id number] Date of birth: [birth year] Age: 68 y.o. Gender: female Date of Admission: 12/09/2023  Date of Discharge: *** Admitting Physician: Charise Companion, MD  Primary Care Provider: Kandis Ormond, DO Consultants: palliative  Indication for Hospitalization: hepatic encephalopathy  Discharge Diagnoses/Problem List:  Principal Problem for Admission: hepatic encephalopathy Other Problems addressed during stay:  Principal Problem:   Hepatic encephalopathy (HCC) Active Problems:   Cirrhosis of liver without ascites (HCC)   Physical deconditioning   End of life care   Chronic health problem   Anasarca   Pancytopenia (HCC)   Pressure injury of skin   Person awaiting admission to adequate facility elsewhere   Nausea    Brief Hospital Course:  Brianna Wheeler is a 68 y.o.female with a history of NASH cirrhosis, type 2 diabetes, hypertension, hypothyroidism, hyperlipidemia, HLD and portal venous thrombosis who was admitted to the [facility] Medicine Teaching Service at [facility] for metabolic encephalopathy.   Her hospital course is detailed below:  Metabolic encephalopathy, cirrhosis Patient presented from SNF with altered mental status, hypokalemia and hyperammonemia which were discovered at her facility.  On arrival she unresponsive and nonverbal, her vital signs were stable she was without fever and she had a nonfocal neurologic exam.  Lactic acid was 3.0 and her white blood cell count was 3.7.  CT head and MRI brain were ordered with no acute intracranial processes found on imaging.  CT abdomen pelvis was obtained which showed her known hepatic cirrhosis with portal hypertension as well as mild cardiomegaly, ascites and splenomegaly.  Started on lactulose  and titrated to 40 g four times daily with improvement.  She received rifaximin  while hospitalized, but  unfortunately this was determined to be unaffordable in the outpatient setting.  Patient had multiple conversations with Palliative Care and ultimately decided to transition to comfort care. She remained in the hospital while placement with an appropriate facility was located. As her condition deteriorated, she was evaluated as a candidate for hospice services. She received a bed offer at [facility] and discharged on ***.  Tachycardia Unclear causes. Considered  PE given risk factors but CTA PE was negative. No evidence of infection including SBP. Resolved spontaneously within 48 hours.   Pancytopenia stable during hospitalization.  Goals of care Continuing DNR DNI. Pt complete HCPOA. Outpatient palliative care consult placed. Continued discussion with patient during hospitalization about severity of condition and ultimately transitioned to comfort care as above.  Hypokalemia Potassium was 2.2 on arrival, patient was given IV potassium supplementation with no change in the first 24 hours of admission.  As patient's mental status improved she was also given oral potassium repletion.  Stabilized on KCL 40 mEq tablets twice daily. Patient ultimately went comfort care and interventions above were discontinued.  Anasarca Likely secondary to her decompensated cirrhosis of the liver, however, given her history of hypothyroidism TSH was collected to rule out myxedema.  TSH returned and showed appropriate TSH level of 1.915.  Was given 1 round of IV Lasix  with some improvement, then restarted on home torsemide  10 mg.  Started aldactone  50 mg. Stable generalized edema at discharge but no major improvement. Patient ultimately went comfort care and interventions above were discontinued.  AKI secondary to decompensated cirrhosis of the liver, hyperammonemia Secondary to hyperammonemia related to her decompensated cirrhosis.  Improved with correction of ammonia levels.  Other chronic conditions were  medically managed with home medications  and formulary alternatives as necessary (type 2 diabetes, hypertension, hypothyroidism, HLD, vitamin D  deficiency, portal venous thrombus, urinary retention)   Follow-up Recommendations: Patient going hospice care at [facility], no follow-up recommendations.   Disposition: SNF  Discharge Condition: stable  Discharge Exam:   ***   Significant Procedures: none  Significant Labs and Imaging:  No results for input(s): "WBC", "HGB", "HCT", "PLT" in the last 48 hours.  No results for input(s): "NA", "K", "CL", "CO2", "GLUCOSE", "BUN", "CREATININE", "CALCIUM ", "MG", "PHOS", "ALKPHOS", "AST", "ALT", "ALBUMIN", "PROTEIN" in the last 48 hours.   CT head without contrast: IMPRESSION: No CT evidence of acute intracranial abnormality.   CT abdomen pelvis without contrast: IMPRESSION: 1. Hepatic cirrhosis with portal hypertension including mild splenomegaly, mild ascites, and substantial retroperitoneal collateral vessels including uphill paraesophageal varices. Splenorenal shunting is suspected. 2. Mild cardiomegaly. 3. Cholelithiasis. 4. Remote endplate compressions at L3 and chronic anterior wedging at T11. Right greater than left foraminal impingement at L4-5 due to intervertebral and facet spurring. 5.  Aortic Atherosclerosis (ICD10-I70.0).   CXR: IMPRESSION: 1. Mild enlargement of the cardiopericardial silhouette. 2. Aortic Atherosclerosis (ICD10-I70.0).  MRI brain w/o contrast  1. No acute intracranial abnormality. 2. Generalized age-related cerebral atrophy with mild to moderate chronic microvascular ischemic disease.   Results/Tests Pending at Time of Discharge: none  Discharge Medications:  Allergies as of 12/28/2023       Reactions   Hydrocodone -acetaminophen     REACTION: Vomited and had toruble breathing in 2009   Nsaids    Makes sick on her stomach   Oxycodone-acetaminophen  Other (See Comments)   Pt hallucinates with  this med   Tetracycline    REACTION: nausea   Bactrim  [sulfamethoxazole -trimethoprim ] Rash     Med Rec must be completed prior to using this [facility]***        Discharge Instructions: Please refer to Patient Instructions section of EMR for full details.  Patient was counseled important signs and symptoms that should prompt return to medical care, changes in medications, dietary instructions, activity restrictions, and follow up appointments.   Follow-Up Appointments:  Future Appointments  Date Time Provider Department Center  01/17/2024  9:30 AM Kathleene Papas [facility]  10/01/2024 11:50 AM [facility] ANNUAL [facility] VISIT [facility]     Lavada Porteous, [place] 12/28/2023, 6:08 AM PGY-2, [facility] Family Medicine

## 2023-12-18 NOTE — Assessment & Plan Note (Addendum)
 Pancytopenia stable.  - Continue home lactulose  - Fall precautions - Monitor routine vitals, fever curve

## 2023-12-18 NOTE — Assessment & Plan Note (Addendum)
 Mentation improved and having several BM on current regimen.  - Continue lactulose  from 40 g four times daily  - Continue rifaximin  550 twice daily for hepatic encephalopathy  - monitor for worsening edema as common side effect  - may discontinue at discharge due to cost - avoid CNS acting medications - Fall precautions - SCDs for VTE prophylaxis, PLT less than 100,000 - Place purewick for urinary incontinence

## 2023-12-18 NOTE — Assessment & Plan Note (Addendum)
 Stable, continue to monitor - Continue close monitoring with daily CBC - Hold anticoagulants for VTE ppx with <100k PLT

## 2023-12-18 NOTE — Assessment & Plan Note (Addendum)
 DMII: stable, continue CBG twice daily HTN: hypotensive, holding home BP meds Hypothyroidism: will restart home synthroid  with improved mental status HLD: restart home statin at discharge Pain: Hold home tramadol , Ordered x1 fentanyl   Vit D deficiency: continue to supplement, will need outpatient Vit D level checked . Portal venous thrombus: Found on admission, stable.  Patient GI follow-up. Urinary retention: Foley placed from 4/19 - 4/23.  Now voiding spontaneously.  To restart  Flomax  0.4 mg daily.

## 2023-12-18 NOTE — Discharge Instructions (Signed)
 Dear Brianna Wheeler,  Thank you for letting us  participate in your care. You were hospitalized for AMS and diagnosed with AMS (altered mental status). You were treated with ***.   POST-HOSPITAL & CARE INSTRUCTIONS *** Go to your follow up appointments (listed below)   DOCTOR'S APPOINTMENT   Future Appointments  Date Time Provider Department Center  01/17/2024  9:30 AM Floreen Hunger [facility]  10/01/2024 11:50 AM [facility] ANNUAL [facility] VISIT [facility]     Take care and be well!  Family Medicine Teaching Service Inpatient Team [place]  [facility]  [address] [place], [place] [address] [phone]

## 2023-12-18 NOTE — Progress Notes (Signed)
 Patient's Spironolactone and Torsemide were held this morning due to low BP per Dr.Rumball. This RN notified the MD that the patient hasn't urinated on her shift at this time.This RN was told to give patient these medications around 1400pm by Dr. Zheng since BP was better.

## 2023-12-18 NOTE — Assessment & Plan Note (Addendum)
 CTA PE 5/26 negative. Sinus tach on EKG 5/26. Improved in last 12+ hours.  - Monitor fever curve for SBP

## 2023-12-18 NOTE — TOC Benefit Eligibility Note (Signed)
 Pharmacy Patient Advocate Encounter  Insurance verification completed.    The patient is insured through [place] . Patient has Medicare and is not eligible for a copay card, but may be able to apply for patient assistance or Medicare RX Payment Plan (Patient Must reach out to their plan, if eligible for payment plan), if available.    Ran test claim for Xifaxan 200mg  and the current 30 day co-pay is $355.00.   This test claim was processed through [facility]- copay amounts may vary at other pharmacies due to pharmacy/plan contracts, or as the patient moves through the different stages of their insurance plan.

## 2023-12-18 NOTE — Progress Notes (Signed)
 Daily Progress Note Intern Pager: [phone]  Patient name: Brianna Wheeler Medical record number: [id number] Date of birth: [birth year] Age: 68 y.o. Gender: female  Primary Care Provider: Kandis Ormond, DO Consultants: none Code Status: DNR, DNI  Pt Overview and Major Events to Date:  Brianna Wheeler is a 68 y.o. female presenting with AMS 2/2 to NASH liver cirrhosis.   Assessment and Plan: Mentation is improved yesterday afternoon and this morning. Medically stable to return to SNF.  Assessment & Plan AMS (altered mental status) Mentation improved and having several BM on current regimen.  - Continue lactulose  from 40 g four times daily  - Continue rifaximin 550 twice daily for hepatic encephalopathy  - monitor for worsening edema as common side effect  - may discontinue at discharge due to cost - avoid CNS acting medications - Fall precautions - SCDs for VTE prophylaxis, PLT less than 100,000 - Place purewick for urinary incontinence Tachycardia CTA PE 5/26 negative. Sinus tach on EKG 5/26. Improved in last 12+ hours.  - Monitor fever curve for SBP Cirrhosis of liver without ascites (HCC) Pancytopenia stable.  - Continue home lactulose  - Fall precautions - Monitor routine vitals, fever curve Pancytopenia (HCC) Stable, continue to monitor - Continue close monitoring with daily CBC - Hold anticoagulants for VTE ppx with <100k PLT  Anasarca Stable, continue to monitor - Continue torsemide to 10 mg - Continue Aldactone 50 mg Hypokalemia Stable 3.7 today.  - Scheduled potassium 40 mEq twice daily  - Replete magnesium  with a goal of 2.0 or higher - Continue Aldactone 50 mg, blood pressure tolerating - AM BMP and mag Hypomagnesemia 2.0 today.  - Continue daily magnesium  level Chronic health problem DMII: stable, continue CBG twice daily HTN: hypotensive, holding home BP meds Hypothyroidism: will restart home synthroid  with improved mental status HLD: restart  home statin at discharge Pain: Hold home tramadol , Ordered x1 fentanyl   Vit D deficiency: continue to supplement, will need outpatient Vit D level checked . Portal venous thrombus: Found on admission, stable.  Patient GI follow-up. Urinary retention: Foley placed from 4/19 - 4/23.  Now voiding spontaneously.  To restart  Flomax  0.4 mg daily.   FEN/GI: regular diet, external urinary catheter PPx: SCD due to platelet count Dispo:SNF pending placement. Medically stable for transfer to SNF.   Subjective:  Reports that she is having back pain. Discussed that would let her nurse know to give her tylenol . Reports that she spoke with her son yesterday. Does not believe that she had a BM this morning but nursing reports that she had 3.   Objective: Temp:  [97.7 F (36.5 C)-98.3 F (36.8 C)] 97.7 F (36.5 C) (05/28 0732) Pulse Rate:  [95-101] 95 (05/28 1040) Resp:  [18-20] 20 (05/28 1040) BP: (96-133)/(47-73) 133/66 (05/28 1153) SpO2:  [97 %-100 %] 99 % (05/28 1040) Weight:  [139.3 kg] 139.3 kg (05/28 0500) Physical Exam: General: alert, comfortable Cardiovascular: normal rate and rhythm. Systolic muurmur 1/6 Respiratory: normal effort. Limited pulm exam but clear to auscultation Abdomen: soft and nontender. Ascites but not significantly changed.  Extremities: continue extremity edema bilaterally.  Neuro: alert and oriented to person, place, and time. Attention intact. Memory is fair. Continued bilateral hand weakness but improving and can grasp now (3/5 R hand, 2/5 L hand).   Laboratory: Most recent CBC Lab Results  Component Value Date   WBC 4.6 12/18/2023   HGB 11.0 (L) 12/18/2023   HCT 33.3 (L) 12/18/2023  MCV 102.1 (H) 12/18/2023   PLT 68 (L) 12/18/2023   Most recent BMP    Latest Ref Rng & Units 12/18/2023    2:40 AM  BMP  Glucose 70 - 99 mg/dL 782   BUN 8 - 23 mg/dL 10   Creatinine 9.56 - 1.00 mg/dL 2.13   Sodium 086 - 578 mmol/L 134   Potassium 3.5 - 5.1 mmol/L 3.7    Chloride 98 - 111 mmol/L 106   CO2 22 - 32 mmol/L 22   Calcium  8.9 - 10.3 mg/dL 8.1    Mg: 2.0  Verdell Given, MD 12/18/2023, 12:00 PM  PGY-1, [facility] Family Medicine FPTS Intern pager: [phone], text pages welcome Secure chat group [facility] Teaching Service

## 2023-12-18 NOTE — Assessment & Plan Note (Addendum)
 Stable, continue to monitor - Continue torsemide to 10 mg - Continue Aldactone 50 mg

## 2023-12-18 NOTE — Assessment & Plan Note (Addendum)
 Stable 3.7 today.  - Scheduled potassium 40 mEq twice daily  - Replete magnesium  with a goal of 2.0 or higher - Continue Aldactone 50 mg, blood pressure tolerating - AM BMP and mag

## 2023-12-18 NOTE — Progress Notes (Signed)
 Patient started vomiting around 1400pm and MD notified that patient's vomit is a brown color and about 10mL. Zofran  was ordered.

## 2023-12-18 NOTE — Plan of Care (Signed)
   Problem: Education: Goal: Knowledge of General Education information will improve Description Including pain rating scale, medication(s)/side effects and non-pharmacologic comfort measures Outcome: Progressing

## 2023-12-18 NOTE — Assessment & Plan Note (Addendum)
 2.0 today.  - Continue daily magnesium  level

## 2023-12-19 DIAGNOSIS — K7682 Hepatic encephalopathy: Secondary | ICD-10-CM | POA: Diagnosis not present

## 2023-12-19 DIAGNOSIS — K7460 Unspecified cirrhosis of liver: Secondary | ICD-10-CM | POA: Diagnosis not present

## 2023-12-19 MED ORDER — GERHARDT'S BUTT CREAM
TOPICAL_CREAM | Freq: Two times a day (BID) | CUTANEOUS | Status: DC
  Administered 2023-12-20 – 2023-12-21 (×2): 1 via TOPICAL
  Filled 2023-12-19 (×3): qty 60

## 2023-12-19 NOTE — Assessment & Plan Note (Signed)
 Mentation improved and having several BM on current regimen. Had 3-4 bowel movements in last 24 hours.  - Continue lactulose  from 40 g four times daily  - Continue rifaximin  550 twice daily for hepatic encephalopathy  - monitor for worsening edema as common side effect  - may discontinue at discharge due to cost - avoid CNS acting medications - Fall precautions - SCDs for VTE prophylaxis, PLT less than 100,000 - Place purewick for urinary incontinence

## 2023-12-19 NOTE — Consult Note (Signed)
 WOC Nurse Consult Note: Reason for Consult:Moisture associated skin damage to perineum and buttocks. Has been given daily lactulose  with daily bowel movements noted, altered mental status and anasarca throughout, including sacrum and  buttocks.   Wound type:moisture associated skin damage (incontinence and fluid overload- liver cirrhosis)  Pressure Injury POA: NA Measurement: 5 cm x 5 cm x 0.1cm to bilateral buttocks.  Perineum is red and moist Wound ZOX:WRUEA Drainage (amount, consistency, odor) scant weeping Periwound:intact, frequently moist, edematous Dressing procedure/placement/frequency: Cleanse perineal skin with soap and water and pat dry. Apply Gerhardts butt paste each shift and PRN soilage Will not follow at this time.  Please re-consult if needed.  Branda Cain MSN, RN, FNP-BC CWON Wound, Ostomy, Continence Nurse Outpatient [facility] [phone] Pager [phone]

## 2023-12-19 NOTE — Assessment & Plan Note (Addendum)
 Pancytopenia stable.  - Continue home lactulose  - Fall precautions - Monitor routine vitals, fever curve

## 2023-12-19 NOTE — Assessment & Plan Note (Addendum)
 DMII: stable, continue CBG twice daily HTN: hypotensive, holding home BP meds Hypothyroidism: will restart home synthroid  with improved mental status HLD: restart home statin at discharge Pain: Hold home tramadol , Ordered x1 fentanyl   Vit D deficiency: continue to supplement, will need outpatient Vit D level checked . Portal venous thrombus: Found on admission, stable.  Patient GI follow-up. Urinary retention: Foley placed from 4/19 - 4/23.  Now voiding spontaneously.  To restart  Flomax  0.4 mg daily.

## 2023-12-19 NOTE — Assessment & Plan Note (Addendum)
 Stable 3.7 today.  - Scheduled potassium 40 mEq twice daily  - Replete magnesium  with a goal of 2.0 or higher - Continue Aldactone 50 mg, blood pressure tolerating - AM BMP and mag

## 2023-12-19 NOTE — TOC Progression Note (Addendum)
 Transition of Care [facility]) - Progression Note    Patient Details  Name: Brianna Wheeler MRN: [id number] Date of Birth: 10/31/1955  Transition of Care [facility]) CM/SW Contact  Arron Big, [place] Phone Number: 12/19/2023, 11:01 AM  Clinical Narrative:   Insurance auth still pending at this time.   1:08 PM insurance is offering a peer to peer due by 4:30pm today, Call [phone] and press opt 5 ; MD will need to say patients name, DOB, and member id.   Above information has been provided to MD and teaching service.   4:14 PM Peer to peer completed. Insurance asking for updated PT notes by 10AM tomorrow. CSW notified PT team.   TOC will continue to follow.    Expected Discharge Plan: Skilled Nursing Facility Barriers to Discharge: Continued Medical Work up, English as a second language teacher  Expected Discharge Plan and Services In-house Referral: Clinical Social Work Discharge Planning Services: CM Consult   Living arrangements for the past 2 months: Single Family Home                                       Social Determinants of Health (SDOH) Interventions SDOH Screenings   Food Insecurity: No Food Insecurity (12/10/2023)  Housing: Low Risk  (12/10/2023)  Transportation Needs: No Transportation Needs (12/10/2023)  Utilities: Not At Risk (12/10/2023)  Alcohol Screen: Low Risk  (09/30/2023)  Depression (PHQ2-9): Low Risk  (09/30/2023)  Financial Resource Strain: Low Risk  (09/30/2023)  Physical Activity: Insufficiently Active (09/30/2023)  Social Connections: Socially Isolated (12/10/2023)  Stress: No Stress Concern Present (09/30/2023)  Tobacco Use: Low Risk  (12/10/2023)  Health Literacy: Adequate Health Literacy (09/30/2023)    Readmission Risk Interventions    11/25/2023    1:00 PM  Readmission Risk Prevention Plan  Transportation Screening Complete  Palliative Care Screening Not Applicable  Medication Review (RN Care Manager) Complete

## 2023-12-19 NOTE — Assessment & Plan Note (Addendum)
 Stable, continue to monitor - Continue close monitoring with daily CBC - Hold anticoagulants for VTE ppx with <100k PLT

## 2023-12-19 NOTE — Plan of Care (Signed)
 Called for peer to peer review.   Per insurance she will need an updated PT note since her mental status has improved. They will also need documentation that she is able to participate in out of bed exercises for at least one hour per day.   They have agreed to keep the case open until 10 AM central time 12/20/23.   Sent new PT referral and will reach out to their team to assess patient today.   Clem Currier, DO [facility], PGY-2 12/19/23 2:55 PM

## 2023-12-19 NOTE — Assessment & Plan Note (Addendum)
 Stable, continue to monitor - Continue torsemide to 10 mg - Continue Aldactone 50 mg

## 2023-12-19 NOTE — Assessment & Plan Note (Addendum)
 CTA PE 5/26 negative. Sinus tach on EKG 5/26. Improved in last 12+ hours.  - Monitor fever curve for SBP

## 2023-12-19 NOTE — Progress Notes (Signed)
 Daily Progress Note Intern Pager: [phone]  Patient name: Brianna Wheeler Medical record number: [id number] Date of birth: 1956/04/01 Age: 68 y.o. Gender: female  Primary Care Provider: Kandis Ormond, DO Consultants: palliative Code Status: full  Pt Overview and Major Events to Date:  Brianna Wheeler is a 68 y.o. female presenting with AMS 2/2 to NASH liver cirrhosis.   Assessment and Plan:  Medically stable for discharge pending placement and palliative to see patient today if in hospital given her new understanding of severity of condition.  Assessment & Plan Hepatic encephalopathy (HCC) Mentation improved and having several BM on current regimen. Had 3-4 bowel movements in last 24 hours.  - Continue lactulose  from 40 g four times daily  - Continue rifaximin 550 twice daily for hepatic encephalopathy  - monitor for worsening edema as common side effect  - may discontinue at discharge due to cost - avoid CNS acting medications - Fall precautions - SCDs for VTE prophylaxis, PLT less than 100,000 - Place purewick for urinary incontinence Tachycardia CTA PE 5/26 negative. Sinus tach on EKG 5/26. Improved in last 12+ hours.  - Monitor fever curve for SBP Cirrhosis of liver without ascites (HCC) Pancytopenia stable.  - Continue home lactulose  - Fall precautions - Monitor routine vitals, fever curve Pancytopenia (HCC) Stable, continue to monitor - Continue close monitoring with daily CBC - Hold anticoagulants for VTE ppx with <100k PLT  Anasarca Stable, continue to monitor - Continue torsemide to 10 mg - Continue Aldactone 50 mg Hypokalemia Stable 3.7 today.  - Scheduled potassium 40 mEq twice daily  - Replete magnesium  with a goal of 2.0 or higher - Continue Aldactone 50 mg, blood pressure tolerating - AM BMP and mag Hypomagnesemia 2.0 today.  - Continue daily magnesium  level Chronic health problem DMII: stable, continue CBG twice daily HTN: hypotensive,  holding home BP meds Hypothyroidism: will restart home synthroid  with improved mental status HLD: restart home statin at discharge Pain: Hold home tramadol , Ordered x1 fentanyl   Vit D deficiency: continue to supplement, will need outpatient Vit D level checked . Portal venous thrombus: Found on admission, stable.  Patient GI follow-up. Urinary retention: Foley placed from 4/19 - 4/23.  Now voiding spontaneously.  To restart  Flomax  0.4 mg daily.   FEN/GI: regular diet PPx: SCD Dispo:SNF pending SNF placement. Medically stable for discharge.   Subjective:  Patient reports no new medical concerns today.  Reports some minimal improvement in her hand weakness.  Reports that she still wants to continue taking lactulose  because she wants to live for her son and that we would have the palliative care team talk with her again.  Discussed that she is stable for transfer back to SNF on current regimen.  Objective: Temp:  [97.5 F (36.4 C)-98.8 F (37.1 C)] 98.6 F (37 C) (05/29 0810) Pulse Rate:  [92-97] 97 (05/29 0810) Resp:  [14-20] 19 (05/29 0810) BP: (117-141)/(43-66) 117/43 (05/29 0810) SpO2:  [96 %-100 %] 96 % (05/29 0810) Weight:  [138.9 kg] 138.9 kg (05/29 0410) Physical Exam: General: Laying in bed, not in acute distress, alert once aroused, conversating Cardiovascular: Normal rate and rhythm.  Systolic murmur. Respiratory: Normal effort.  Difficulty hearing lung sounds. Abdomen: Soft nontender.  Stable ascites. Extremities: Significant lower extremity and bilateral but stable  Laboratory: Most recent CBC Lab Results  Component Value Date   WBC 4.6 12/18/2023   HGB 11.0 (L) 12/18/2023   HCT 33.3 (L) 12/18/2023   MCV  102.1 (H) 12/18/2023   PLT 68 (L) 12/18/2023   Most recent BMP    Latest Ref Rng & Units 12/18/2023    2:40 AM  BMP  Glucose 70 - 99 mg/dL 259   BUN 8 - 23 mg/dL 10   Creatinine 5.63 - 1.00 mg/dL 8.75   Sodium 643 - 329 mmol/L 134   Potassium 3.5 - 5.1  mmol/L 3.7   Chloride 98 - 111 mmol/L 106   CO2 22 - 32 mmol/L 22   Calcium  8.9 - 10.3 mg/dL 8.1     Verdell Given, MD 12/19/2023, 12:51 PM  PGY-1, [facility] FPTS Intern pager: [phone], text pages welcome Secure chat group [facility] Teaching Service

## 2023-12-19 NOTE — Assessment & Plan Note (Addendum)
 2.0 today.  - Continue daily magnesium  level

## 2023-12-20 DIAGNOSIS — K7682 Hepatic encephalopathy: Secondary | ICD-10-CM | POA: Diagnosis not present

## 2023-12-20 DIAGNOSIS — Z751 Person awaiting admission to adequate facility elsewhere: Secondary | ICD-10-CM

## 2023-12-20 DIAGNOSIS — L8990 Pressure ulcer of unspecified site, unspecified stage: Secondary | ICD-10-CM | POA: Insufficient documentation

## 2023-12-20 MED ORDER — ONDANSETRON 4 MG PO TBDP
4.0000 mg | ORAL_TABLET | Freq: Two times a day (BID) | ORAL | Status: DC | PRN
  Administered 2023-12-20: 4 mg via ORAL
  Filled 2023-12-20: qty 1

## 2023-12-20 MED ORDER — ONDANSETRON 4 MG PO TBDP
4.0000 mg | ORAL_TABLET | Freq: Two times a day (BID) | ORAL | Status: DC
  Administered 2023-12-20 – 2023-12-21 (×3): 4 mg via ORAL
  Filled 2023-12-20 (×3): qty 1

## 2023-12-20 NOTE — Progress Notes (Signed)
 Physical Therapy Treatment Patient Details Name: Brianna Wheeler MRN: [id number] DOB: 01/20/1956 Today's Date: 12/20/2023   History of Present Illness 68 yo patient presenting with AMS 2/2 NASH cirrhosis. She has a PMH of T2DM, hypertension, hypothyroid, HLD, portal venous thrombosis urinary retention.    PT Comments  Pt greeted asleep supine in bed, aroused to verbal command, but intermittently nodding off requiring increased stimulation to participate in session. She was oriented to her name only. Pt engaged in bed mobility and seated balance with decreased physical assistance. She is making slow progress towards her acute PT goals. She completed bed mobility with 1+ assist. Deferred OOB secondary to pt's lethargy, fatigue, and lack of 2+ assist available. Pt required increased time to complete all tasks, moderate encouragement, and max cues for sequencing. She would benefit from continued inpatient follow up therapy, <3 hours/day.      If plan is discharge home, recommend the following: Two people to help with walking and/or transfers;Two people to help with bathing/dressing/bathroom;Assistance with cooking/housework;Assistance with feeding;Direct supervision/assist for medications management;Direct supervision/assist for financial management;Assist for transportation;Help with stairs or ramp for entrance;Supervision due to cognitive status   Can travel by private vehicle     No  Equipment Recommendations  Hospital bed    Recommendations for Other Services       Precautions / Restrictions Precautions Precautions: Fall Recall of Precautions/Restrictions: Impaired Precaution/Restrictions Comments: L eye visual deficits Restrictions Weight Bearing Restrictions Per Provider Order: No     Mobility  Bed Mobility Overal bed mobility: Needs Assistance Bed Mobility: Rolling, Sidelying to Sit, Sit to Sidelying Rolling: Used rails, Max assist Sidelying to sit: [name] elevated, Used rails,  Mod assist     Sit to sidelying: Mod assist, Max assist General bed mobility comments: Pt sat up on L side of bed. Educated pt on sequencing of bed mobility. Cued pt to bend R knee and reach across body with RUE to bedrail, facilitated with modA to position R UE/LE and use of bed pad to pivot hips. Pt brought BLE off EOB with increased time. She required modA at trunk to reach upright. Pt demonstrated a significant posterior and L lateral lean while seated EOB. She quickly fatigued in sitting and would return to supine with her trunk supported on bed and head supported on pillow propped up by side bed rail. She would return to sitting upright by pulling on PT with RUE and PT aiding in elevated L-side of trunk. As pt fatigued, she required increased assist. Returning to bed pt required maxA to align trunk properly and modA to bring BLE back into bed. Pt assisted in repositioning herself in bed with use of bedrail in RUE and pushing with BLE.    Transfers                   General transfer comment: Deferred secondary to lack of +2 assist available and pt's lethargy/fatigue.    Ambulation/Gait                   Stairs             Wheelchair Mobility     Tilt Bed    Modified Rankin (Stroke Patients Only)       Balance Overall balance assessment: Needs assistance Sitting-balance support: Feet supported, Single extremity supported Sitting balance-[name]: Poor Sitting balance - Comments: Pt required modA to maintained seated balance. She was able to maintain upright posture for ~0-20 seconds before fatiguing and requiring  to lay back and rest on bed. Cued pt to increase forward lean and to activate core. Postural control: Posterior lean, Left lateral lean                                  Communication Communication Communication: No apparent difficulties  Cognition Arousal: Lethargic Behavior During Therapy: Flat affect   PT - Cognitive  impairments: Orientation, Problem solving, Attention, Initiation, Safety/Judgement   Orientation impairments: Place, Time, Situation, Person                   PT - Cognition Comments: Pt was able to identify her name, but unable to state her DOB. She does not appear to appreciate the gravity of her health status. Pt takes increased time to initate movemetn and requires max encouragement and moderate multi-modal cueing. Following commands: Impaired Following commands impaired: Follows one step commands with increased time    Cueing Cueing Techniques: Verbal cues, Tactile cues, Gestural cues  Exercises      General Comments General comments (skin integrity, edema, etc.): Pt's rectal tube was noted to be leaking slightly, RN notified and came in at the end of session to clean pt up.      Pertinent Vitals/Pain Pain Assessment Pain Assessment: No/denies pain    Home Living                          Prior Function            PT Goals (current goals can now be found in the care plan section) Acute Rehab PT Goals Patient Stated Goal: Get better Progress towards PT goals: Progressing toward goals    Frequency    Min 1X/week      PT Plan      Co-evaluation              AM-PAC PT "6 Clicks" Mobility   Outcome Measure  Help needed turning from your back to your side while in a flat bed without using bedrails?: A Lot Help needed moving from lying on your back to sitting on the side of a flat bed without using bedrails?: A Lot Help needed moving to and from a bed to a chair (including a wheelchair)?: Total Help needed standing up from a chair using your arms (e.g., wheelchair or bedside chair)?: Total Help needed to walk in hospital room?: Total Help needed climbing 3-5 steps with a railing? : Total 6 Click Score: 8    End of Session   Activity Tolerance: Patient limited by lethargy;Patient limited by fatigue Patient left: in bed;with call bell/phone  within reach;with nursing/sitter in room Nurse Communication: Mobility status;Need for lift equipment PT Visit Diagnosis: Adult, failure to thrive (R62.7);Muscle weakness (generalized) (M62.81);Difficulty in walking, not elsewhere classified (R26.2)     Time: 0727-0758 PT Time Calculation (min) (ACUTE ONLY): 31 min  Charges:    $Therapeutic Activity: 23-37 mins PT General Charges $$ ACUTE PT VISIT: 1 Visit                     Glenford Lanes, PT, DPT Acute Rehabilitation Services Office: [phone] Secure Chat Preferred  Riva Chester 12/20/2023, 8:28 AM

## 2023-12-20 NOTE — Progress Notes (Signed)
 Occupational Therapy Treatment Patient Details Name: Brianna Wheeler MRN: [id number] DOB: August 09, 1955 Today's Date: 12/20/2023   History of present illness 68 yo patient presenting with AMS 2/2 NASH cirrhosis. She has a PMH of T2DM, hypertension, hypothyroid, HLD, portal venous thrombosis urinary retention.   OT comments  Pt alert. Disoriented to date. When reminded, pt recalled it was time to pay her rent and cell phone bill. Total assist to place phone call to her son and left a message. Pt required max assist for bed mobility and demonstrated poor sitting tolerance and balance. Returned to supine with [name] up for UE exercises, grooming and self feeding (drink with straw). Educated pt in use of call button. Able to activate after practice using R thumb. Patient will benefit from continued inpatient follow up therapy, <3 hours/day.      If plan is discharge home, recommend the following:  Two people to help with walking and/or transfers;Two people to help with bathing/dressing/bathroom;Assistance with cooking/housework;Direct supervision/assist for medications management;Direct supervision/assist for financial management;Assist for transportation;Help with stairs or ramp for entrance   Equipment Recommendations  Hospital bed;Hoyer lift    Recommendations for Other Services      Precautions / Restrictions Precautions Precautions: Fall Recall of Precautions/Restrictions: Impaired Precaution/Restrictions Comments: L eye visual deficits Restrictions Weight Bearing Restrictions Per Provider Order: No       Mobility Bed Mobility Overal bed mobility: Needs Assistance Bed Mobility: Rolling, Sidelying to Sit, Sit to Sidelying Rolling: Used rails, Max assist Sidelying to sit: [name] elevated, Used rails, Mod assist     Sit to sidelying: Mod assist General bed mobility comments: cues to self assist and use of rail to roll, max assist with use of bed pad for rolling, max assist to raise trunk  and assist for LEs back into bed    Transfers                         Balance Overall balance assessment: Needs assistance Sitting-balance support: Feet supported, Single extremity supported Sitting balance-[name]: Poor Sitting balance - Comments: poor sitting balance and tolerance, non functional for ADLs Postural control: Posterior lean, Left lateral lean                                 ADL either performed or assessed with clinical judgement   ADL Overall ADL's : Needs assistance/impaired Eating/Feeding: Maximal assistance;Sitting Eating/Feeding Details (indicate cue type and reason): pt able to reach out to grasp and return cup with straw to OT's hand, assist to guide straw to mouth Grooming: Oral care;Minimal assistance;Brushing hair;Total assistance;Bed level Grooming Details (indicate cue type and reason): decreased thoroughness with oral care                                    Extremity/Trunk Assessment              Vision       Perception     Praxis     Communication Communication Communication: No apparent difficulties   Cognition Arousal: Alert Behavior During Therapy: Flat affect Cognition: Cognition impaired   Orientation impairments: Time, Situation Awareness: Intellectual awareness impaired, Online awareness impaired Memory impairment (select all impairments): Short-term memory, Declarative long-term memory, Working memory, Non-declarative long-term memory Attention impairment (select first level of impairment): Selective attention Executive functioning impairment (select  all impairments): Initiation, Sequencing, Reasoning, Problem solving OT - Cognition Comments: pt did not recognize call button, asking, "What is this? Can you teach me how to use it?"                 Following commands: Impaired Following commands impaired: Follows one step commands with increased time      Cueing   Cueing  Techniques: Verbal cues, Tactile cues, Gestural cues  Exercises General Exercises - Upper Extremity Shoulder Flexion: AAROM, Both, 10 reps, Supine Shoulder Horizontal ABduction: AAROM, Both, 10 reps, Supine Shoulder Horizontal ADduction: AAROM, Both, 10 reps, Supine Elbow Flexion: AAROM, Both, 10 reps, Supine Elbow Extension: AAROM, Both, 10 reps, Supine    Shoulder Instructions       General Comments      Pertinent Vitals/ Pain       Pain Assessment Pain Assessment: No/denies pain  Home Living                                          Prior Functioning/Environment              Frequency  Min 1X/week        Progress Toward Goals  OT Goals(current goals can now be found in the care plan section)  Progress towards OT goals: Progressing toward goals  Acute Rehab OT Goals OT Goal Formulation: With patient Time For Goal Achievement: 12/28/23 Potential to Achieve Goals: Fair  Plan      Co-evaluation                 AM-PAC OT "6 Clicks" Daily Activity     Outcome Measure   Help from another person eating meals?: A Lot Help from another person taking care of personal grooming?: A Lot Help from another person toileting, which includes using toliet, bedpan, or urinal?: Total Help from another person bathing (including washing, rinsing, drying)?: Total Help from another person to put on and taking off regular upper body clothing?: Total Help from another person to put on and taking off regular lower body clothing?: Total 6 Click Score: 8    End of Session    OT Visit Diagnosis: Muscle weakness (generalized) (M62.81);Other symptoms and signs involving cognitive function   Activity Tolerance Patient tolerated treatment well   Patient Left in bed;with call bell/phone within reach;with bed alarm set   Nurse Communication          Time: 7829-5621 OT Time Calculation (min): 35 min  Charges: OT General Charges $OT Visit: 1 Visit OT  Treatments $Self Care/Home Management : 8-22 mins $Therapeutic Exercise: 8-22 mins  Avanell Leigh, OTR/L Acute Rehabilitation Services Office: [phone]  Jonette Nestle 12/20/2023, 3:07 PM

## 2023-12-20 NOTE — Assessment & Plan Note (Signed)
 From lactulose  and significant diarrhea and immobility. Improving plan per nursing to catch stool in container and using creams and frequently turning and cleaning.

## 2023-12-20 NOTE — Assessment & Plan Note (Addendum)
 DMII: stable, continue CBG twice daily HTN: hypotensive, holding home BP meds Hypothyroidism: will restart home synthroid  with improved mental status HLD: restart home statin at discharge Pain: Hold home tramadol , Ordered x1 fentanyl   Vit D deficiency: continue to supplement, will need outpatient Vit D level checked . Portal venous thrombus: Found on admission, stable.  Patient GI follow-up. Urinary retention: Foley placed from 4/19 - 4/23.  Now voiding spontaneously.  To restart  Flomax  0.4 mg daily.

## 2023-12-20 NOTE — Assessment & Plan Note (Signed)
 Pending SNF placement recommended by PT and OT. Was declined after peer to peer and repeat PT assessement on 5/30. However LCSW reported that insurance says we can re-apply. See plan of care note 5/30 for other dispo options. Follow up PT / OT assessments Follow up LCSW (including about VA resources) Follow up palliative care recommendation

## 2023-12-20 NOTE — Plan of Care (Addendum)
 Dispo options for patient  Talked with patient and her son.  Discussed the severity of condition with her son.  Discussed with patient and son that she had been declined from SNF but was informed by social worker during his conversation that insurance is open to resending request for SNF.  However emphasized the importance of patient and son understanding this very situation and her end-stage liver disease and need for planning around her medical care and excise.  Discussed the following options for patient:  Continuing full scope of care and getting patient to SNF If patient is not accepted to SNF she will remain in hospital indefinitely as there is no safe discharge Was already declined for SNF but as recommended by PT/OT and insurance says we can reapply for this Continue full scope of care getting patient to long-term care Inform patient that she would have to pay out-of-pocket for this Inform patient that they would also likely take her social security check if cannot pay Patient does have secondary VA coverage (primary coverage [employer]) and LCSW will look into if VA has long term care resources but reports this is unlikely, as Medicaid usually only covers long term care Transition to comfort care including possible inpatient hospice Discussed that this to be covered by her insurance Discussed that palliative care team would be able to talk more about this   Patient and son know now gravity of situation and will have palliative and our team continue this conversation.

## 2023-12-20 NOTE — Assessment & Plan Note (Addendum)
 Stable - Continue torsemide to 10 mg - Continue Aldactone 50 mg

## 2023-12-20 NOTE — Assessment & Plan Note (Addendum)
 CTA PE 5/26 negative. Sinus tach on EKG 5/26. Improved in last 12+ hours.  - Monitor fever curve for SBP

## 2023-12-20 NOTE — TOC Progression Note (Addendum)
 Transition of Care [facility]) - Progression Note    Patient Details  Name: Brianna Wheeler MRN: [id number] Date of Birth: 1955/12/29  [facility] CM/SW Contact  Arron Big, [place] Phone Number: 12/20/2023, 12:42 PM  Clinical Narrative:   Peer to peer completed and determination has been made. Insurance denied SNF at this time. Per insurance, denied due to acute care course not being complete; insurance agrees that patient needs therapy. [employer] information - phone: [phone] ; Fax : [phone]. CSW relayed information to treatment team and patient.  Teaching Service reached out to CSW to discuss options for care. Teaching service inquired about payor sources for LTC and if her VA benefits would cover. CSW called [employer] and left VM to inquire about benefits.   12:59 PM Palliative following patient.   2:24 PM [employer] called CSW back and stated they do not financially assist with LTC, only STR at a SNF.   TOC will continue to follow.    Expected Discharge Plan: Skilled Nursing Facility Barriers to Discharge: Continued Medical Work up, English as a second language teacher  Expected Discharge Plan and Services In-house Referral: Clinical Social Work Discharge Planning Services: CM Consult   Living arrangements for the past 2 months: Single Family Home                                       Social Determinants of Health (SDOH) Interventions SDOH Screenings   Food Insecurity: No Food Insecurity (12/10/2023)  Housing: Low Risk  (12/10/2023)  Transportation Needs: No Transportation Needs (12/10/2023)  Utilities: Not At Risk (12/10/2023)  Alcohol Screen: Low Risk  (09/30/2023)  Depression (PHQ2-9): Low Risk  (09/30/2023)  Financial Resource Strain: Low Risk  (09/30/2023)  Physical Activity: Insufficiently Active (09/30/2023)  Social Connections: Socially Isolated (12/10/2023)  Stress: No Stress Concern Present (09/30/2023)  Tobacco Use: Low Risk  (12/10/2023)  Health  Literacy: Adequate Health Literacy (09/30/2023)    Readmission Risk Interventions    11/25/2023    1:00 PM  Readmission Risk Prevention Plan  Transportation Screening Complete  Palliative Care Screening Not Applicable  Medication Review (RN Care Manager) Complete

## 2023-12-20 NOTE — Assessment & Plan Note (Addendum)
 Mentation improved and having several BM on current regimen. At goal for ~3 bowel movements.  - Continue lactulose  from 40 g four times daily  - Discontinued rifaximin due to cost - avoid CNS acting medications - Fall precautions - SCDs for VTE prophylaxis, PLT less than 100,000 - Place purewick for urinary incontinence

## 2023-12-20 NOTE — Assessment & Plan Note (Addendum)
 Pancytopenia stable.  - Continue home lactulose  - Fall precautions - Monitor routine vitals, fever curve

## 2023-12-20 NOTE — Assessment & Plan Note (Addendum)
 Stable.  - Scheduled potassium 40 mEq twice daily  - Continue Aldactone  50 mg, blood pressure tolerating

## 2023-12-20 NOTE — Progress Notes (Addendum)
 Daily Progress Note Intern Pager: [phone]  Patient name: Brianna Wheeler Medical record number: [id number] Date of birth: 1956/01/15 Age: 68 y.o. Gender: female  Primary Care Provider: Kandis Ormond, DO Consultants: palliative Code Status: DNR, DNI  Pt Overview and Major Events to Date:  Brianna Wheeler is a 68 y.o. female presenting with AMS 2/2 to NASH liver cirrhosis.   Assessment and Plan: Declined from SNF by insurance despite recommendations from PT and OT. Patient is not stable for discharge home, so she will need to remain in hospital. We will follow with LCSW on next step regarding transfer to long term placement.  Palliative care to see patient today or tomorrow, spoke with them on phone to confirm. Updated by LCSW that we can reapply for SNF but may not get accepted.   Assessment & Plan Physical deconditioning Pending SNF placement recommended by PT and OT. Was declined after peer to peer and repeat PT assessement on 5/30. However LCSW reported that insurance says we can re-apply. See plan of care note 5/30 for other dispo options. Follow up PT / OT assessments Follow up LCSW (including about [facility]) Follow up palliative care recommendation Hepatic encephalopathy (HCC) Mentation improved and having several BM on current regimen. At goal for ~3 bowel movements.  - Continue lactulose  from 40 g four times daily  - Discontinued rifaximin due to cost - avoid CNS acting medications - Fall precautions - SCDs for VTE prophylaxis, PLT less than 100,000 - Place purewick for urinary incontinence Tachycardia CTA PE 5/26 negative. Sinus tach on EKG 5/26. Improved in last 12+ hours.  - Monitor fever curve for SBP Cirrhosis of liver without ascites (HCC) Pancytopenia stable.  - Continue home lactulose  - Fall precautions - Monitor routine vitals, fever curve Pancytopenia (HCC) Stable - Hold anticoagulants for VTE ppx with <100k PLT  Anasarca Stable - Continue  torsemide to 10 mg - Continue Aldactone 50 mg Hypokalemia Stable.  - Scheduled potassium 40 mEq twice daily  - Continue Aldactone 50 mg, blood pressure tolerating Hypomagnesemia Stable.   Pressure injury of skin From lactulose  and significant diarrhea and immobility. Improving plan per nursing to catch stool in container and using creams and frequently turning and cleaning.  Chronic health problem DMII: stable, continue CBG twice daily HTN: hypotensive, holding home BP meds Hypothyroidism: will restart home synthroid  with improved mental status HLD: restart home statin at discharge Pain: Hold home tramadol , Ordered x1 fentanyl   Vit D deficiency: continue to supplement, will need outpatient Vit D level checked . Portal venous thrombus: Found on admission, stable.  Patient GI follow-up. Urinary retention: Foley placed from 4/19 - 4/23.  Now voiding spontaneously.  To restart  Flomax  0.4 mg daily.   FEN/GI: regular diet, peripheral IV x 1.  PPx: SCD Dispo:SNF pending acceptance by insurance despite PT/OT recommending. No other safe discharge alternatives at this time.   Subjective:  Discussed with patient that SNF was declined due to insurance declining despite recommendation from PT/OT and no other safe discharge options at this time. Reported that she will stay in the hospital at this time. Reports no worsening of her edema. Reports that she spoke with her son last night and requested that we update him. Reported that I would talk to her and her son on the phone in afternoon once we have talked as a team.   Per nursing, pt is not eating much and report that she is on rectal cup to catch her  stool given the significance of bowel movements currently on lactulose . Report improvement in the skin issues.   Objective: Temp:  [98 F (36.7 C)-98.6 F (37 C)] 98.2 F (36.8 C) (05/30 0740) Pulse Rate:  [74-99] 74 (05/30 1116) Resp:  [13-20] 18 (05/30 1116) BP: (113-123)/(53-65) 113/60  (05/30 1116) SpO2:  [93 %-99 %] 94 % (05/30 1116) Weight:  [139.1 kg] 139.1 kg (05/30 0513) Physical Exam: General: calm, cooperative, not in acute distress Cardiovascular: normal rate and rhythm, systolic murmur.  Respiratory: normal effort at rest. Difficult to auscultate due to body habitus.  Abdomen: soft and nontender. Moderate ascites which might be minimal increased. Veins are visible and congested.  Extremities: diffuse lower and upper extremity edema that significant but stable. No wounds on extremity.   Laboratory: Most recent CBC Lab Results  Component Value Date   WBC 4.6 12/18/2023   HGB 11.0 (L) 12/18/2023   HCT 33.3 (L) 12/18/2023   MCV 102.1 (H) 12/18/2023   PLT 68 (L) 12/18/2023   Most recent BMP    Latest Ref Rng & Units 12/18/2023    2:40 AM  BMP  Glucose 70 - 99 mg/dL 098   BUN 8 - 23 mg/dL 10   Creatinine 1.19 - 1.00 mg/dL 1.47   Sodium 829 - 562 mmol/L 134   Potassium 3.5 - 5.1 mmol/L 3.7   Chloride 98 - 111 mmol/L 106   CO2 22 - 32 mmol/L 22   Calcium  8.9 - 10.3 mg/dL 8.1     Verdell Given, MD 12/20/2023, 1:46 PM  PGY-1, [facility] FPTS Intern pager: [phone], text pages welcome Secure chat group [facility] Teaching Service

## 2023-12-20 NOTE — Assessment & Plan Note (Addendum)
 Stable

## 2023-12-20 NOTE — Assessment & Plan Note (Addendum)
 Stable - Hold anticoagulants for VTE ppx with <100k PLT

## 2023-12-21 DIAGNOSIS — Z7189 Other specified counseling: Secondary | ICD-10-CM

## 2023-12-21 DIAGNOSIS — R110 Nausea: Secondary | ICD-10-CM | POA: Diagnosis not present

## 2023-12-21 DIAGNOSIS — R5381 Other malaise: Secondary | ICD-10-CM

## 2023-12-21 DIAGNOSIS — K7682 Hepatic encephalopathy: Secondary | ICD-10-CM | POA: Diagnosis not present

## 2023-12-21 DIAGNOSIS — Z751 Person awaiting admission to adequate facility elsewhere: Secondary | ICD-10-CM

## 2023-12-21 DIAGNOSIS — K7460 Unspecified cirrhosis of liver: Secondary | ICD-10-CM | POA: Diagnosis not present

## 2023-12-21 MED ORDER — LACTULOSE 10 GM/15ML PO SOLN: 30.0000 g | Freq: Three times a day (TID) | ORAL | Status: DC

## 2023-12-21 MED ORDER — GLYCOPYRROLATE 0.2 MG/ML IJ SOLN: 0.2000 mg | INTRAMUSCULAR | Status: DC | PRN

## 2023-12-21 MED ORDER — LACTULOSE 10 GM/15ML PO SOLN
30.0000 g | Freq: Four times a day (QID) | ORAL | Status: DC
  Administered 2023-12-21: 30 g via ORAL
  Filled 2023-12-21: qty 45

## 2023-12-21 MED ORDER — LORAZEPAM 2 MG/ML PO CONC: 1.0000 mg | ORAL | Status: DC | PRN

## 2023-12-21 MED ORDER — POLYVINYL ALCOHOL 1.4 % OP SOLN: 1.0000 [drp] | Freq: Four times a day (QID) | OPHTHALMIC | Status: DC | PRN

## 2023-12-21 MED ORDER — BIOTENE DRY MOUTH MT LIQD
15.0000 mL | OROMUCOSAL | Status: DC | PRN
Start: 2023-12-21 — End: 2023-12-31

## 2023-12-21 MED ORDER — ENSURE PLUS HIGH PROTEIN PO LIQD
237.0000 mL | Freq: Two times a day (BID) | ORAL | Status: DC
  Administered 2023-12-21 – 2023-12-24 (×5): 237 mL via ORAL

## 2023-12-21 MED ORDER — DIPHENHYDRAMINE HCL 50 MG/ML IJ SOLN
12.5000 mg | INTRAMUSCULAR | Status: DC | PRN
Start: 2023-12-21 — End: 2023-12-31
  Filled 2023-12-21: qty 1

## 2023-12-21 MED ORDER — MORPHINE SULFATE (PF) 2 MG/ML IV SOLN
1.0000 mg | INTRAVENOUS | Status: DC | PRN
  Filled 2023-12-21: qty 1

## 2023-12-21 MED ORDER — ONDANSETRON 4 MG PO TBDP
8.0000 mg | ORAL_TABLET | Freq: Three times a day (TID) | ORAL | Status: AC
  Administered 2023-12-21 – 2023-12-23 (×5): 8 mg via ORAL
  Filled 2023-12-21 (×6): qty 2

## 2023-12-21 MED ORDER — LORAZEPAM 1 MG PO TABS
1.0000 mg | ORAL_TABLET | ORAL | Status: DC | PRN
  Administered 2023-12-22 – 2023-12-23 (×2): 1 mg via ORAL
  Filled 2023-12-21 (×2): qty 1

## 2023-12-21 MED ORDER — RIFAXIMIN 550 MG PO TABS
550.0000 mg | ORAL_TABLET | Freq: Two times a day (BID) | ORAL | Status: DC
  Administered 2023-12-21: 550 mg via ORAL
  Filled 2023-12-21 (×2): qty 1

## 2023-12-21 MED ORDER — MORPHINE SULFATE (CONCENTRATE) 10 MG /0.5 ML PO SOLN
5.0000 mg | ORAL | Status: DC | PRN
  Administered 2023-12-21 – 2023-12-30 (×5): 5 mg via ORAL
  Filled 2023-12-21 (×7): qty 0.5

## 2023-12-21 MED ORDER — MORPHINE SULFATE (CONCENTRATE) 10 MG /0.5 ML PO SOLN
5.0000 mg | ORAL | Status: DC | PRN
  Administered 2023-12-23 – 2023-12-26 (×7): 5 mg via SUBLINGUAL
  Filled 2023-12-21 (×7): qty 0.5

## 2023-12-21 MED ORDER — LORAZEPAM 2 MG/ML IJ SOLN: 1.0000 mg | INTRAMUSCULAR | Status: DC | PRN

## 2023-12-21 MED ORDER — GLYCOPYRROLATE 1 MG PO TABS: 1.0000 mg | ORAL_TABLET | ORAL | Status: DC | PRN

## 2023-12-21 NOTE — Assessment & Plan Note (Signed)
 Pending SNF placement recommended by PT and OT. Was declined after peer to peer and repeat PT assessement on 5/30. However LCSW reported that insurance says we can re-apply. See plan of care note 5/30 for other dispo options. Follow up PT / OT assessments Follow up LCSW (including about VA resources) Follow up palliative care recommendation

## 2023-12-21 NOTE — Progress Notes (Signed)
 Daily Progress Note Intern Pager: [phone]  Patient name: Brianna Wheeler Medical record number: [id number] Date of birth: [birth year] Age: 68 y.o. Gender: female  Primary Care Provider: Kandis Ormond, DO Consultants: Palliative Code Status: DNR-limited  Pt Overview and Major Events to Date:  5/19: Admitted  Assessment and Plan:  Is a 68 year old female patient presenting with altered mental status secondary to [facility] liver cirrhosis.  Her mental status is now at baseline and she is medically stable for discharge pending decision on disposition once she leaves the hospital. Assessment & Plan Physical deconditioning Pending SNF placement recommended by PT and OT. Was declined after peer to peer and repeat PT assessement on 5/30. However LCSW reported that insurance says we can re-apply. See plan of care note 5/30 for other dispo options. Follow up PT / OT assessments Follow up LCSW (including about [facility] resources) Follow up palliative care recommendation Hepatic encephalopathy (HCC) Mentation improved and having several BM on current regimen. At goal for ~3 bowel movements.  - Continue lactulose  from 40 g four times daily  - Discontinued rifaximin  due to cost - avoid CNS acting medications - Fall precautions - SCDs for VTE prophylaxis, PLT less than 100,000 - Place purewick for urinary incontinence Tachycardia Vitals have remained stable over the last 24 hours. - Monitor fever curve for SBP Cirrhosis of liver without ascites (HCC) Pancytopenia stable.  - Continue home lactulose  - Fall precautions - Monitor routine vitals, fever curve Pancytopenia (HCC) Stable - Hold anticoagulants for VTE ppx with <100k PLT  Anasarca Stable - Continue torsemide  to 10 mg - Continue Aldactone  50 mg Hypokalemia Stable.  - Scheduled potassium 40 mEq twice daily  - Continue Aldactone  50 mg, blood pressure tolerating Hypomagnesemia Stable.   Pressure injury of skin From lactulose  and  significant diarrhea and immobility. Improving plan per nursing to catch stool in container and using creams and frequently turning and cleaning.  Chronic health problem DMII: stable, continue CBG twice daily HTN: hypotensive, holding home BP meds Hypothyroidism: will restart home synthroid  with improved mental status HLD: restart home statin at discharge Pain: Hold home tramadol , Ordered x1 fentanyl   Vit D deficiency: continue to supplement, will need outpatient Vit D level checked . Portal venous thrombus: Found on admission, stable.  Patient GI follow-up. Urinary retention: Foley placed from 4/19 - 4/23.  Now voiding spontaneously.  To restart  Flomax  0.4 mg daily.  FEN/GI: Regular PPx: None given low platelets Dispo:Pending insurance appeal and patient decision   Subjective:  Patient resting comfortably on exam. Call bell within reach. No acute ovn events  Objective: Temp:  [98.2 F (36.8 C)-99.1 F (37.3 C)] 99.1 F (37.3 C) (05/31 0446) Pulse Rate:  [72-89] 72 (05/31 0446) Resp:  [15-19] 18 (05/30 2045) BP: (104-131)/(53-63) 108/53 (05/31 0446) SpO2:  [94 %-99 %] 98 % (05/31 0446) Weight:  [139.1 kg] 139.1 kg (05/30 0513) Physical Exam: General: Ill appearing, elderly female, no distress Cardiovascular: RRR, no m/r/g Respiratory: CTAB, no increased WOB Extremities: 3+ pitting edema of the bilateral lower extremities  Laboratory: Most recent CBC Lab Results  Component Value Date   WBC 4.6 12/18/2023   HGB 11.0 (L) 12/18/2023   HCT 33.3 (L) 12/18/2023   MCV 102.1 (H) 12/18/2023   PLT 68 (L) 12/18/2023   Most recent BMP    Latest Ref Rng & Units 12/18/2023    2:40 AM  BMP  Glucose 70 - 99 mg/dL 147   BUN 8 - 23  mg/dL 10   Creatinine 2.84 - 1.00 mg/dL 1.32   Sodium 440 - 102 mmol/L 134   Potassium 3.5 - 5.1 mmol/L 3.7   Chloride 98 - 111 mmol/L 106   CO2 22 - 32 mmol/L 22   Calcium  8.9 - 10.3 mg/dL 8.1    Rayma Calandra, DO 12/21/2023, 5:04 AM  PGY-1,  [place] [facility] FPTS Intern pager: [phone], text pages welcome Secure chat group [facility] Teaching Service

## 2023-12-21 NOTE — Assessment & Plan Note (Signed)
 DMII: stable, continue CBG twice daily HTN: hypotensive, holding home BP meds Hypothyroidism: will restart home synthroid  with improved mental status HLD: restart home statin at discharge Pain: Hold home tramadol , Ordered x1 fentanyl   Vit D deficiency: continue to supplement, will need outpatient Vit D level checked . Portal venous thrombus: Found on admission, stable.  Patient GI follow-up. Urinary retention: Foley placed from 4/19 - 4/23.  Now voiding spontaneously.  To restart  Flomax  0.4 mg daily.

## 2023-12-21 NOTE — TOC Progression Note (Addendum)
 Transition of Care [facility]) - Progression Note    Patient Details  Name: Brianna Wheeler MRN: [id number] Date of Birth: [birth year]  Transition of Care [facility]) CM/SW Contact  Arron Big, [place] Phone Number: 12/21/2023, 12:31 PM  Clinical Narrative:  Per Palliative, patient is worried about who will look after her son.  Patients son has a CNA. Patient is unable to pay for LTC due to costs and lack of LTC insurance coverage. CSW will reach out to Financial Counseling to determine if patient qualifies for LTC Medicaid.   2:23 PM Per Palliative, patient now comfort care. Primary decision maker is [name]. As patient continues to decondition will need to follow up on hospice facility placement when appropriate.   TOC will continue to follow.    Expected Discharge Plan: Skilled Nursing Facility Barriers to Discharge: Continued Medical Work up, English as a second language teacher  Expected Discharge Plan and Services In-house Referral: Clinical Social Work Discharge Planning Services: CM Consult   Living arrangements for the past 2 months: Single Family Home                                       Social Determinants of Health (SDOH) Interventions SDOH Screenings   Food Insecurity: No Food Insecurity (12/10/2023)  Housing: Low Risk  (12/10/2023)  Transportation Needs: No Transportation Needs (12/10/2023)  Utilities: Not At Risk (12/10/2023)  Alcohol Screen: Low Risk  (09/30/2023)  Depression (PHQ2-9): Low Risk  (09/30/2023)  Financial Resource Strain: Low Risk  (09/30/2023)  Physical Activity: Insufficiently Active (09/30/2023)  Social Connections: Socially Isolated (12/10/2023)  Stress: No Stress Concern Present (09/30/2023)  Tobacco Use: Low Risk  (12/10/2023)  Health Literacy: Adequate Health Literacy (09/30/2023)    Readmission Risk Interventions    11/25/2023    1:00 PM  Readmission Risk Prevention Plan  Transportation Screening Complete  Palliative Care Screening Not Applicable   Medication Review (RN Care Manager) Complete

## 2023-12-21 NOTE — Assessment & Plan Note (Signed)
 Mentation improved and having several BM on current regimen. At goal for ~3 bowel movements.  - Continue lactulose  from 40 g four times daily  - Discontinued rifaximin due to cost - avoid CNS acting medications - Fall precautions - SCDs for VTE prophylaxis, PLT less than 100,000 - Place purewick for urinary incontinence

## 2023-12-21 NOTE — Assessment & Plan Note (Signed)
 Stable - Continue torsemide to 10 mg - Continue Aldactone 50 mg

## 2023-12-21 NOTE — Assessment & Plan Note (Signed)
 Pancytopenia stable.  - Continue home lactulose  - Fall precautions - Monitor routine vitals, fever curve

## 2023-12-21 NOTE — Plan of Care (Signed)

## 2023-12-21 NOTE — Assessment & Plan Note (Signed)
 Stable

## 2023-12-21 NOTE — Assessment & Plan Note (Signed)
 From lactulose  and significant diarrhea and immobility. Improving plan per nursing to catch stool in container and using creams and frequently turning and cleaning.

## 2023-12-21 NOTE — Assessment & Plan Note (Signed)
 Stable - Hold anticoagulants for VTE ppx with <100k PLT

## 2023-12-21 NOTE — Assessment & Plan Note (Signed)
 Vitals have remained stable over the last 24 hours. - Monitor fever curve for SBP

## 2023-12-21 NOTE — Progress Notes (Signed)
 Daily Progress Note   Patient Name: Brianna Wheeler       Date: 12/21/2023 DOB: Nov 20, 1955  Age: 68 y.o. MRN#: [id number] Attending Physician: McDiarmid, Demetra Filter, MD Primary Care Physician: Kandis Ormond, DO Admit Date: 12/09/2023  Reason for Consultation/Follow-up: Establishing goals of care  Subjective: Medical records reviewed including progress notes, labs and imaging. Patient assessed at the bedside. She reports N/V earlier today. Discussed with RN. No family present during my visit. Patient states son is available after 5pm, cared for by neighbor's son before then. She would like for him to participate in the discussion. I attempted to call but was unable to reach. Voicemail with PMT contact information was provided.  Patient was unfamiliar with palliative medicine from prior visit and I introduced palliative medicine as specialized medical care for people living with serious illness. It focuses on providing relief from the symptoms and stress of a serious illness. The goal is to improve quality of life for both the patient and the family. Explained the difference between palliative care and hospice philosophy, reviewing that hospice is an insurance benefit covered for patients expected to have prognosis of 6 months or less. She states that she does not clearly understand why she was told that she is dying. I shared that the medical team is concerned about her liver disease and reviewed prognostication with her. MELD score is 17. Shared that I would advocate for more conversation with her primary team as well and she is appreciative.  Created space and opportunity for patient and family's thoughts and feelings on patient's current illness. Her goals are to ensure patient will continue to be cared for by neighboring family after her death and  to see her son "reach accomplishments that he has not been able to reach before." She does not want to leave her son alone but also does not want to burden him. It would not be acceptable to her if she was bedbound for a prolonged period of time. She still desires to pursue rehabilitation if approved and would consider hospice in the future if she did not make considerable progress towards her mobility. When she does die, she wishes to be cremated. Her priority is always going to be her son. Emotional support and therapeutic listening was provided.   Discussed LTC vs hospice facility, as home care is not an option for her regardless of the focus on her care. She understands that she is not quite eligible for hospice facility at this time, though anything can happen at any time and her condition would rapidly deteriorate without current interventions. She shares that  LTC is not financially an option for her either.     Received call from RN that patient's son and neighbor were present at the bedside. I arrived to provide an update on th above conversation and clarification on next steps. Patient did not recall the content of our conversation only an hour or two beforehand. She told her family that she loves them and she thinks "it's time to go." She was more aligned with comfort care and hospital philosophy after confirming multiple times, but wanted to hear [name] and [employer] opinions. They encouraged her to attempt to work with rehab and build further upon the improvements and medical optimization accomplished during this hospitalization.  She is initially agreeable.  I explored her understanding of the option of hospice in the future, emphasized importance of sharing her quality of life and perception of suffering with her loved ones.  Counseled on the importance of indicating when she has reached her limit would like to transition to more comfort focused approach.  She then states she is already at that  point.  Her medications are not causing her more harm than benefit.  She is having trouble breathing, nauseous, vomiting, overall worsening quality of life that she does not want any further.  Comfort care was explained in detail and patient would like to transition today.  Her son and neighbor are both supportive of her and will inform the pastor tonight.  Continue discussing hospice facility options.  Questions and concerns addressed. PMT will continue to support holistically.   Length of Stay: 12   Physical Exam Vitals and nursing note reviewed.  Constitutional:      General: She is not in acute distress.    Appearance: She is ill-appearing.  Cardiovascular:     Rate and Rhythm: Normal rate.  Pulmonary:     Effort: Pulmonary effort is normal.  Skin:    General: Skin is warm and dry.  Neurological:     Mental Status: She is alert. Mental status is at baseline.  Psychiatric:        Behavior: Behavior normal.             Vital Signs: BP (!) 108/45 (BP Location: Left Arm)   Pulse 78   Temp 98.1 F (36.7 C) (Oral)   Resp 16   Ht 5\' 11"  (1.803 m)   Wt (!) 139.1 kg   SpO2 96%   BMI 42.77 kg/m  SpO2: SpO2: 96 % O2 Device: O2 Device: Room Air O2 Flow Rate:        Palliative Assessment/Data: 30%   Palliative Care Assessment & Plan   Patient Profile: 68 y.o. female  with past medical history of NASH cirrhosis, thrombocytopenia, smoldering myeloma, DM2, HTN, hypothyroid, HLD admitted on 12/09/2023 with altered  mental status. Workup revealed likely due to decompensated cirrhosis with elevated ammonia. Palliative medicine consulted for GOC.    Assessment: Goals of care conversation NASH cirrhosis Hepatic encephalopathy, resolved Debility and deconditioning  Recommendations/Plan: Continue DNR/DNI Transition to comfort focused care today Morphine PRN for pain/air hunger/comfort Robinul PRN for excessive secretions Ativan  PRN for agitation/anxiety Zofran  PRN for  nausea Liquifilm tears PRN for dry eyes May have comfort feeding Comfort cart for family Unrestricted visitations in the setting of EOL (per policy) Oxygen PRN 2L or less for comfort. No escalation.   Patient's short-term goal is to confirm continued arrangements for her disabled son's care with neighbors family after her death Ongoing goals of care discussions regarding hospice facility Psychosocial  and emotional support provided PMT will continue to follow and support   Prognosis:  Poor, likely to deteriorate rapidly after discontinuation of lactulose   Discharge Planning: To Be Determined  Care plan was discussed with patient, patient's son, patient's neighbor, RN, family medicine teaching service   Total time: I spent 120 minutes in the care of the patient today in the above activities and documenting the encounter.  MDM high         Virat Prather Alroy Jericho, PA-C  Palliative Medicine Team Team phone # [phone]  Thank you for allowing the Palliative Medicine Team to assist in the care of this patient. Please utilize secure chat with additional questions, if there is no response within 30 minutes please call the above phone number.  Palliative Medicine Team providers are available by phone from 7am to 7pm daily and can be reached through the team cell phone.  Should this patient require assistance outside of these hours, please call the patient's attending physician.

## 2023-12-21 NOTE — Assessment & Plan Note (Signed)
 Stable.  - Scheduled potassium 40 mEq twice daily  - Continue Aldactone  50 mg, blood pressure tolerating

## 2023-12-21 NOTE — Plan of Care (Signed)
   Problem: Role Relationship: Goal: Family's ability to cope with current situation will improve Outcome: Progressing

## 2023-12-22 DIAGNOSIS — K7682 Hepatic encephalopathy: Secondary | ICD-10-CM | POA: Diagnosis not present

## 2023-12-22 DIAGNOSIS — Z7189 Other specified counseling: Secondary | ICD-10-CM | POA: Diagnosis not present

## 2023-12-22 DIAGNOSIS — Z751 Person awaiting admission to adequate facility elsewhere: Secondary | ICD-10-CM | POA: Diagnosis not present

## 2023-12-22 DIAGNOSIS — K7460 Unspecified cirrhosis of liver: Secondary | ICD-10-CM | POA: Diagnosis not present

## 2023-12-22 DIAGNOSIS — Z515 Encounter for palliative care: Secondary | ICD-10-CM | POA: Diagnosis not present

## 2023-12-22 NOTE — Assessment & Plan Note (Signed)
 DMII: Discontinued CBGs and medications HTN: hypotensive Hypothyroidism: Home Synthroid  ordered HLD: Statin held Pain: Morphine Urinary retention: Can replace Foley if needed

## 2023-12-22 NOTE — Plan of Care (Signed)
   Problem: Education: Goal: Knowledge of General Education information will improve Description: Including pain rating scale, medication(s)/side effects and non-pharmacologic comfort measures Outcome: Progressing   Problem: Clinical Measurements: Goal: Will remain free from infection Outcome: Progressing

## 2023-12-22 NOTE — Progress Notes (Signed)
 Daily Progress Note   Patient Name: Brianna Wheeler       Date: 12/22/2023 DOB: Mar 02, 1956  Age: 68 y.o. MRN#: [id number] Attending Physician: McDiarmid, Demetra Filter, MD Primary Care Physician: Kandis Ormond, DO Admit Date: 12/09/2023  Reason for Consultation/Follow-up: Establishing goals of care  Subjective: Medical records reviewed including progress notes, labs and imaging. No PRN comfort medications administered per MAR. Patient assessed at the bedside. She remains about the same as yesterday and denies pain, anxiety, dyspnea or other distress. She would like some help with her lunch. Discussed with NT.  Encouraged patient to request symptom management medications as the need arised including opioids and benzodiazepines.  She reiterated her request for assistance eating.  Questions and concerns addressed. PMT will continue to support holistically.   Length of Stay: 13   Physical Exam Vitals and nursing note reviewed.  Constitutional:      General: She is not in acute distress.    Appearance: She is ill-appearing.  Cardiovascular:     Rate and Rhythm: Normal rate.  Pulmonary:     Effort: Pulmonary effort is normal.  Skin:    General: Skin is warm and dry.  Neurological:     Mental Status: She is alert. Mental status is at baseline.  Psychiatric:        Behavior: Behavior normal.             Vital Signs: BP (!) 103/48 (BP Location: Left Arm)   Pulse 93   Temp 98.2 F (36.8 C) (Oral)   Resp 19   Ht 5\' 11"  (1.803 m)   Wt (!) 139.1 kg   SpO2 96%   BMI 42.77 kg/m  SpO2: SpO2: 96 % O2 Device: O2 Device: Room Air O2 Flow Rate:        Palliative Assessment/Data: 30%   Palliative Care Assessment & Plan   Patient Profile: 68 y.o. female  with past medical history of NASH cirrhosis, thrombocytopenia, smoldering myeloma,  DM2, HTN, hypothyroid, HLD admitted on 12/09/2023 with altered  mental status. Workup revealed likely due to decompensated cirrhosis with elevated ammonia. Palliative medicine consulted for GOC.    Assessment: Goals of care conversation NASH cirrhosis Hepatic encephalopathy, resolved Debility and deconditioning  Recommendations/Plan: Continue DNR/DNI Continue comfort focused care today, no adjustments required today Ongoing goals of care discussions regarding hospice facility. Still unclear whether she would be eligible given her appetite and lack of symptoms Psychosocial and emotional support provided PMT will continue to follow and support   Prognosis:  Poor, likely to deteriorate rapidly after discontinuation of lactulose   Discharge Planning: To Be Determined  Care plan was discussed with patient, NT   MDM high         Kiala Faraj Alroy Jericho, PA-C  Palliative Medicine Team Team phone # [phone]  Thank you for allowing the Palliative Medicine Team to assist in the care of this patient. Please utilize secure chat with additional questions, if there is no response within 30 minutes please call the above phone number.  Palliative Medicine Team providers are available by phone from 7am to 7pm daily and can be reached through the team cell phone.  Should this patient require assistance outside  of these hours, please call the patient's attending physician.

## 2023-12-22 NOTE — Plan of Care (Signed)
  Problem: Education: Goal: Knowledge of General Education information will improve Description: Including pain rating scale, medication(s)/side effects and non-pharmacologic comfort measures Outcome: Adequate for Discharge   Problem: Health Behavior/Discharge Planning: Goal: Ability to manage health-related needs will improve Outcome: Adequate for Discharge   Problem: Clinical Measurements: Goal: Ability to maintain clinical measurements within normal limits will improve Outcome: Adequate for Discharge Goal: Will remain free from infection Outcome: Adequate for Discharge Goal: Diagnostic test results will improve Outcome: Adequate for Discharge Goal: Respiratory complications will improve Outcome: Adequate for Discharge Goal: Cardiovascular complication will be avoided Outcome: Adequate for Discharge   Problem: Activity: Goal: Risk for activity intolerance will decrease Outcome: Adequate for Discharge   Problem: Nutrition: Goal: Adequate nutrition will be maintained Outcome: Adequate for Discharge   Problem: Coping: Goal: Level of anxiety will decrease Outcome: Adequate for Discharge   Problem: Elimination: Goal: Will not experience complications related to bowel motility Outcome: Adequate for Discharge Goal: Will not experience complications related to urinary retention Outcome: Adequate for Discharge   Problem: Pain Managment: Goal: General experience of comfort will improve and/or be controlled Outcome: Adequate for Discharge   Problem: Safety: Goal: Ability to remain free from injury will improve Outcome: Adequate for Discharge   Problem: Skin Integrity: Goal: Risk for impaired skin integrity will decrease Outcome: Adequate for Discharge   Problem: Education: Goal: Knowledge of the prescribed therapeutic regimen will improve Outcome: Adequate for Discharge   Problem: Coping: Goal: Ability to identify and develop effective coping behavior will  improve Outcome: Adequate for Discharge   Problem: Clinical Measurements: Goal: Quality of life will improve Outcome: Adequate for Discharge   Problem: Respiratory: Goal: Verbalizations of increased ease of respirations will increase Outcome: Adequate for Discharge   Problem: Role Relationship: Goal: Family's ability to cope with current situation will improve Outcome: Adequate for Discharge Goal: Ability to verbalize concerns, feelings, and thoughts to partner or family member will improve Outcome: Adequate for Discharge   Problem: Pain Management: Goal: Satisfaction with pain management regimen will improve Outcome: Adequate for Discharge

## 2023-12-22 NOTE — Assessment & Plan Note (Signed)
 From lactulose  and significant diarrhea and immobility. Improving plan per nursing to catch stool in container and using creams and frequently turning and cleaning.

## 2023-12-22 NOTE — Assessment & Plan Note (Addendum)
 In setting of cirrhosis.  Now at baseline mental status.  Pending placement to SNF versus hospice as she is now comfort care as of yesterday and non comfort care medications have been stopped. Follow up LCSW

## 2023-12-22 NOTE — Plan of Care (Signed)

## 2023-12-22 NOTE — Progress Notes (Signed)
     Daily Progress Note Intern Pager: [phone]  Patient name: Brianna Wheeler Medical record number: [id number] Date of birth: 05-Feb-1956 Age: 68 y.o. Gender: female  Primary Care Provider: Kandis Ormond, DO Consultants: Palliative Code Status: DNR-Limited  Pt Overview and Major Events to Date:  5/19-admitted  Assessment and Plan: Patient is a 68 year old female presented with altered mental status secondary to NASH liver cirrhosis.  She is now comfort care.  Mental status now at baseline, she is medically stable for discharge and awaiting placement at facility Assessment & Plan Hepatic encephalopathy (HCC) In setting of cirrhosis.  Now at baseline mental status.  Pending placement to SNF versus hospice as she is now comfort care as of yesterday and non comfort care medications have been stopped. Follow up LCSW  Pressure injury of skin From lactulose  and significant diarrhea and immobility. Improving plan per nursing to catch stool in container and using creams and frequently turning and cleaning.  Chronic health problem DMII: Discontinued CBGs and medications HTN: hypotensive Hypothyroidism: Home Synthroid  ordered HLD: Statin held Pain: Morphine Urinary retention: Can replace Foley if needed    FEN/GI: Regular PPx: N/A-comfort care Dispo: SNF versus hospice facility  Subjective:  Patient sleeping, did not wake.  No acute events overnight  Objective: Temp:  [98.1 F (36.7 C)-98.4 F (36.9 C)] 98.4 F (36.9 C) (05/31 1946) Pulse Rate:  [78-92] 92 (05/31 1946) Resp:  [16-18] 18 (05/31 1946) BP: (101-108)/(45-51) 101/51 (05/31 1946) SpO2:  [96 %-100 %] 96 % (05/31 1946) Physical Exam: General: Sleeping comfortably Respiratory: Normal effort on room air  Laboratory: Most recent CBC Lab Results  Component Value Date   WBC 4.6 12/18/2023   HGB 11.0 (L) 12/18/2023   HCT 33.3 (L) 12/18/2023   MCV 102.1 (H) 12/18/2023   PLT 68 (L) 12/18/2023   Most recent  BMP    Latest Ref Rng & Units 12/18/2023    2:40 AM  BMP  Glucose 70 - 99 mg/dL 130   BUN 8 - 23 mg/dL 10   Creatinine 8.65 - 1.00 mg/dL 7.84   Sodium 696 - 295 mmol/L 134   Potassium 3.5 - 5.1 mmol/L 3.7   Chloride 98 - 111 mmol/L 106   CO2 22 - 32 mmol/L 22   Calcium  8.9 - 10.3 mg/dL 8.1        Glenn Lange, DO 12/22/2023, 5:47 AM  PGY-3, [place] [facility] FPTS Intern pager: [phone], text pages welcome Secure chat group [facility] Teaching Service

## 2023-12-23 DIAGNOSIS — K7682 Hepatic encephalopathy: Secondary | ICD-10-CM | POA: Diagnosis not present

## 2023-12-23 DIAGNOSIS — K7460 Unspecified cirrhosis of liver: Secondary | ICD-10-CM | POA: Diagnosis not present

## 2023-12-23 NOTE — TOC Progression Note (Addendum)
 Transition of Care [facility]) - Progression Note    Patient Details  Name: Brianna Wheeler MRN: [id number] Date of Birth: 29-May-1956  Transition of Care [facility]) CM/SW Contact  Arron Big, [place] Phone Number: 12/23/2023, 1:57 PM  Clinical Narrative:   Palliative following. Patient is DNR Comfort at this time.  Financial Counseling followed up with CSW. Patient is over income and does not qualify for LTC Medicaid. CSW notified treatment team.  TOC will continue to follow.      Expected Discharge Plan: Skilled Nursing Facility Barriers to Discharge: Continued Medical Work up, English as a second language teacher  Expected Discharge Plan and Services In-house Referral: Clinical Social Work Discharge Planning Services: CM Consult   Living arrangements for the past 2 months: Single Family Home                                       Social Determinants of Health (SDOH) Interventions SDOH Screenings   Food Insecurity: No Food Insecurity (12/10/2023)  Housing: Low Risk  (12/10/2023)  Transportation Needs: No Transportation Needs (12/10/2023)  Utilities: Not At Risk (12/10/2023)  Alcohol Screen: Low Risk  (09/30/2023)  Depression (PHQ2-9): Low Risk  (09/30/2023)  Financial Resource Strain: Low Risk  (09/30/2023)  Physical Activity: Insufficiently Active (09/30/2023)  Social Connections: Socially Isolated (12/10/2023)  Stress: No Stress Concern Present (09/30/2023)  Tobacco Use: Low Risk  (12/10/2023)  Health Literacy: Adequate Health Literacy (09/30/2023)    Readmission Risk Interventions    11/25/2023    1:00 PM  Readmission Risk Prevention Plan  Transportation Screening Complete  Palliative Care Screening Not Applicable  Medication Review (RN Care Manager) Complete

## 2023-12-23 NOTE — Assessment & Plan Note (Signed)
 Maintains baseline mentation, awaiting placement at SNF versus hospice facility. Follow up LCSW

## 2023-12-23 NOTE — Progress Notes (Signed)
     Daily Progress Note Intern Pager: [phone]  Patient name: Brianna Wheeler Medical record number: [id number] Date of birth: 1955-09-20 Age: 68 y.o. Gender: female  Primary Care Provider: Kandis Ormond, DO Consultants: Palliative Code Status: DNR limited  Pt Overview and Major Events to Date:  5/19: Admitted  Assessment and Plan:  This is a 68 year old female patient presenting with AMS secondary to [facility] liver cirrhosis.  Patient is now on comfort care, her mental status is at baseline and she is medically stable for discharge, awaiting placement at facility. Assessment & Plan Hepatic encephalopathy (HCC) Maintains baseline mentation, awaiting placement at SNF versus hospice facility. Follow up LCSW  Pressure injury of skin From lactulose  and significant diarrhea and immobility. Improving plan per nursing to catch stool in container and using creams and frequently turning and cleaning.  Chronic health problem DMII: Discontinued CBGs and medications HTN: hypotensive Hypothyroidism: Home Synthroid  ordered HLD: Statin held Pain: Morphine Urinary retention: Can replace Foley if needed  FEN/GI: Regular PPx: N/A- comfort care Dispo:SNF vs. hospice.   Subjective:  Patient reports no complaints this morning.  She says she had a satisfactory breakfast.  No acute overnight events  Objective: Temp:  [98 F (36.7 C)-98.3 F (36.8 C)] 98 F (36.7 C) (06/02 0723) Pulse Rate:  [71-79] 71 (06/02 0723) Resp:  [18] 18 (06/02 0723) BP: (86-125)/(38-45) 86/38 (06/02 0723) SpO2:  [96 %-99 %] 99 % (06/02 0723) Physical Exam: General: Ill-appearing elderly female, no distress Cardiovascular: RRR, no M/R/G Respiratory: CTAB, no increased work of breathing Abdomen: Soft, nontender Extremities: Grossly edematous bilaterally.  Laboratory: Most recent CBC Lab Results  Component Value Date   WBC 4.6 12/18/2023   HGB 11.0 (L) 12/18/2023   HCT 33.3 (L) 12/18/2023   MCV 102.1  (H) 12/18/2023   PLT 68 (L) 12/18/2023   Most recent BMP    Latest Ref Rng & Units 12/18/2023    2:40 AM  BMP  Glucose 70 - 99 mg/dL 147   BUN 8 - 23 mg/dL 10   Creatinine 8.29 - 1.00 mg/dL 5.62   Sodium 130 - 865 mmol/L 134   Potassium 3.5 - 5.1 mmol/L 3.7   Chloride 98 - 111 mmol/L 106   CO2 22 - 32 mmol/L 22   Calcium  8.9 - 10.3 mg/dL 8.1    Rayma Calandra, DO 12/23/2023, 8:58 AM  PGY-1, [place] [facility] FPTS Intern pager: [phone], text pages welcome Secure chat group [facility] Teaching Service

## 2023-12-23 NOTE — Assessment & Plan Note (Signed)
 From lactulose  and significant diarrhea and immobility. Improving plan per nursing to catch stool in container and using creams and frequently turning and cleaning.

## 2023-12-23 NOTE — Plan of Care (Signed)
  Problem: Education: Goal: Knowledge of General Education information will improve Description: Including pain rating scale, medication(s)/side effects and non-pharmacologic comfort measures Outcome: Not Progressing   Problem: Pain Management: Goal: Satisfaction with pain management regimen will improve Outcome: Not Progressing

## 2023-12-23 NOTE — Progress Notes (Signed)
 Daily Progress Note   Patient Name: Brianna Wheeler       Date: 12/23/2023 DOB: 07-Nov-1955  Age: 68 y.o. MRN#: [id number] Attending Physician: McDiarmid, Demetra Filter, MD Primary Care Physician: Kandis Ormond, DO Admit Date: 12/09/2023  Reason for Consultation/Follow-up: Establishing goals of care  Subjective: Medical records reviewed including progress notes, labs and imaging. Per MAR, patient received PRN ativan  last night and PRN morphine PO this morning. Patient assessed at the bedside. She is anxious about finances and endorses feeling that this is excessive worrying. She also has back pain despite received morphine. Per RN, uncertain how much she is eating as she was sleeping very comfortably this morning. No family present during my visit.  Encouraged patient to request symptom management medications as the need arised including opioids and benzodiazepines. She is agreeable to trying PRN IV morphine for breakthrough back pain and ativan  for her anxiety today. Discussed with RN.   Questions and concerns addressed. PMT will continue to support holistically.   Length of Stay: 14   Physical Exam Vitals and nursing note reviewed.  Constitutional:      General: She is not in acute distress.    Appearance: She is ill-appearing.  Cardiovascular:     Rate and Rhythm: Normal rate.  Pulmonary:     Effort: Pulmonary effort is normal.  Skin:    General: Skin is warm and dry.  Neurological:     Mental Status: She is alert. Mental status is at baseline.  Psychiatric:        Behavior: Behavior normal.             Vital Signs: BP (!) 86/38 (BP Location: Left Arm)   Pulse 71   Temp 98 F (36.7 C) (Oral)   Resp 18   Ht 5\' 11"  (1.803 m)   Wt (!) 139.1 kg   SpO2 99%   BMI 42.77 kg/m  SpO2: SpO2: 99 % O2 Device: O2 Device: Room Air O2  Flow Rate:        Palliative Assessment/Data: 20-30%   Palliative Care Assessment & Plan   Patient Profile: 68 y.o. female  with past medical history of NASH cirrhosis, thrombocytopenia, smoldering myeloma, DM2, HTN, hypothyroid, HLD admitted on 12/09/2023 with altered  mental status. Workup revealed likely due to decompensated cirrhosis with elevated ammonia. Palliative medicine consulted for GOC.    Assessment: NASH cirrhosis Hepatic encephalopathy Debility and deconditioning End of life care  Recommendations/Plan: Continue DNR/DNI Continue comfort focused care, no adjustments required today Patient symptoms currently managed with PRN opioids and benzos. Continue to offer for comfort Hospice facility with decompensation vs LTC with hospice Psychosocial and emotional support provided PMT will continue to follow and support   Prognosis:  Poor, likely to deteriorate rapidly after discontinuation of lactulose   Discharge Planning: To Be Determined  Care plan was discussed with patient, RN   MDM high         Shauni Henner Alroy Jericho, PA-C  Palliative Medicine Team Team phone # [phone]  Thank you for allowing the Palliative Medicine Team to assist in the care of this patient. Please utilize secure chat with additional questions, if there is no response within 30 minutes  please call the above phone number.  Palliative Medicine Team providers are available by phone from 7am to 7pm daily and can be reached through the team cell phone.  Should this patient require assistance outside of these hours, please call the patient's attending physician.

## 2023-12-23 NOTE — Assessment & Plan Note (Signed)
 DMII: Discontinued CBGs and medications HTN: hypotensive Hypothyroidism: Home Synthroid  ordered HLD: Statin held Pain: Morphine Urinary retention: Can replace Foley if needed

## 2023-12-24 ENCOUNTER — Encounter (HOSPITAL_COMMUNITY): Payer: Self-pay | Admitting: Family Medicine

## 2023-12-24 DIAGNOSIS — K7460 Unspecified cirrhosis of liver: Secondary | ICD-10-CM | POA: Diagnosis not present

## 2023-12-24 DIAGNOSIS — K7682 Hepatic encephalopathy: Secondary | ICD-10-CM

## 2023-12-24 DIAGNOSIS — Z515 Encounter for palliative care: Secondary | ICD-10-CM | POA: Diagnosis not present

## 2023-12-24 NOTE — Assessment & Plan Note (Signed)
 DMII: Discontinued CBGs and medications HTN: hypotensive Hypothyroidism: Home Synthroid  ordered HLD: Statin held Pain: Morphine Urinary retention: Can replace Foley if needed Anasarca: Continue home diuresis regimen

## 2023-12-24 NOTE — Assessment & Plan Note (Signed)
 Pending SNF placement recommended by PT and OT. Was declined after peer to peer and repeat PT assessement on 5/30. However LCSW reported that insurance says we can re-apply. See plan of care note 5/30 for other dispo options. Follow up PT / OT assessments Follow up LCSW (including about VA resources) Follow up palliative care recommendation

## 2023-12-24 NOTE — TOC Progression Note (Signed)
 Transition of Care [facility]) - Progression Note    Patient Details  Name: Brianna Wheeler MRN: [id number] Date of Birth: 09-18-1955  Transition [facility]) CM/SW Contact  Jonathan Neighbor, RN Phone Number: 12/24/2023, 3:20 PM  Clinical Narrative:     CM called and spoke to pts son and he is unaware of mothers condition. CM called [name] and was unable to leave a voicemail. CM did send HIPAA appt text. Awaiting return call.  TOC following.  Expected Discharge Plan: Skilled Nursing Facility Barriers to Discharge: Continued Medical Work up, English as a second language teacher  Expected Discharge Plan and Services In-house Referral: Clinical Social Work Discharge Planning Services: CM Consult   Living arrangements for the past 2 months: Single Family Home                                       Social Determinants of Health (SDOH) Interventions SDOH Screenings   Food Insecurity: No Food Insecurity (12/10/2023)  Housing: Low Risk  (12/10/2023)  Transportation Needs: No Transportation Needs (12/10/2023)  Utilities: Not At Risk (12/10/2023)  Alcohol Screen: Low Risk  (09/30/2023)  Depression (PHQ2-9): Low Risk  (09/30/2023)  Financial Resource Strain: Low Risk  (09/30/2023)  Physical Activity: Insufficiently Active (09/30/2023)  Social Connections: Socially Isolated (12/10/2023)  Stress: No Stress Concern Present (09/30/2023)  Tobacco Use: Low Risk  (12/10/2023)  Health Literacy: Adequate Health Literacy (09/30/2023)    Readmission Risk Interventions    11/25/2023    1:00 PM  Readmission Risk Prevention Plan  Transportation Screening Complete  Palliative Care Screening Not Applicable  Medication Review (RN Care Manager) Complete

## 2023-12-24 NOTE — Progress Notes (Signed)
     Daily Progress Note Intern Pager: [phone]  Patient name: Brianna Wheeler Medical record number: [id number] Date of birth: April 30, 1956 Age: 68 y.o. Gender: female  Primary Care Provider: Kandis Ormond, DO Consultants: Palliative Code Status: Limited  Pt Overview and Major Events to Date:  5/19: admitted  Assessment and Plan:  This is a 68 year old female patient initially presented with AMS secondary to [facility] liver cirrhosis.  Mental status is at baseline.  Patient is now comfort care and medically stable for discharge, awaiting placement.  Assessment & Plan Hepatic encephalopathy (HCC) Mentation waning this morning, more difficulty with word finding, thinks its raining, awaiting placement at SNF versus hospice facility. Follow up LCSW  Pressure injury of skin From lactulose  and significant diarrhea and immobility. Improving plan per nursing to catch stool in container and using creams and frequently turning and cleaning.  Physical deconditioning Pending SNF placement recommended by PT and OT. Was declined after peer to peer and repeat PT assessement on 5/30. However LCSW reported that insurance says we can re-apply. See plan of care note 5/30 for other dispo options. Follow up PT / OT assessments Follow up LCSW (including about [facility] resources) Follow up palliative care recommendation Chronic health problem DMII: Discontinued CBGs and medications HTN: hypotensive Hypothyroidism: Home Synthroid  ordered HLD: Statin held Pain: Morphine Urinary retention: Can replace Foley if needed Anasarca: Continue home diuresis regimen End of life care Comfort measures only. EKG for chest pain showed NSR.   FEN/GI: Regular PPx: None, comfort care Dispo:Pending placement   Subjective:  Endorses chest pain this morning, reproducible with palpation of the R shoulder and chest wall. No shortness of breath. Unable to fully articulate what the pain feels like, mental status appears to be  waning today.  Objective: Temp:  [98.2 F (36.8 C)-98.5 F (36.9 C)] 98.5 F (36.9 C) (06/02 2254) Pulse Rate:  [77-91] 77 (06/02 2254) Resp:  [18] 18 (06/02 2254) BP: (103-112)/(50-52) 103/50 (06/02 2254) SpO2:  [97 %-100 %] 97 % (06/02 2254) Physical Exam: General: Mental status waning today Cardiovascular: RRR, no m/r/g Respiratory: CTAB no increased WOB  Laboratory: Most recent CBC Lab Results  Component Value Date   WBC 4.6 12/18/2023   HGB 11.0 (L) 12/18/2023   HCT 33.3 (L) 12/18/2023   MCV 102.1 (H) 12/18/2023   PLT 68 (L) 12/18/2023   Most recent BMP    Latest Ref Rng & Units 12/18/2023    2:40 AM  BMP  Glucose 70 - 99 mg/dL 086   BUN 8 - 23 mg/dL 10   Creatinine 5.78 - 1.00 mg/dL 4.69   Sodium 629 - 528 mmol/L 134   Potassium 3.5 - 5.1 mmol/L 3.7   Chloride 98 - 111 mmol/L 106   CO2 22 - 32 mmol/L 22   Calcium  8.9 - 10.3 mg/dL 8.1     Rayma Calandra, DO 12/24/2023, 8:00 AM  PGY-1, [place] [facility] FPTS Intern pager: [phone], text pages welcome Secure chat group [facility] Teaching Service

## 2023-12-24 NOTE — Assessment & Plan Note (Signed)
 From lactulose  and significant diarrhea and immobility. Improving plan per nursing to catch stool in container and using creams and frequently turning and cleaning.

## 2023-12-24 NOTE — Assessment & Plan Note (Signed)
 Comfort measures only. EKG for chest pain showed NSR.

## 2023-12-24 NOTE — Assessment & Plan Note (Addendum)
 Mentation waning this morning, more difficulty with word finding, thinks its raining, awaiting placement at SNF versus hospice facility. Follow up LCSW

## 2023-12-25 ENCOUNTER — Encounter (HOSPITAL_COMMUNITY): Payer: Self-pay | Admitting: Family Medicine

## 2023-12-25 DIAGNOSIS — Z515 Encounter for palliative care: Secondary | ICD-10-CM | POA: Diagnosis not present

## 2023-12-25 DIAGNOSIS — K7682 Hepatic encephalopathy: Secondary | ICD-10-CM | POA: Diagnosis not present

## 2023-12-25 NOTE — Assessment & Plan Note (Addendum)
 Sleeping on exam. LCSW is pursuing placement with residential hospice.  Follow up LCSW

## 2023-12-25 NOTE — Progress Notes (Addendum)
     Daily Progress Note Intern Pager: [phone]  Patient name: Brianna Wheeler Medical record number: [id number] Date of birth: [birth year] Age: 68 y.o. Gender: female  Primary Care Provider: Kandis Ormond, DO Consultants: Palliative Code Status: DNR-Comfort  Pt Overview and Major Events to Date:  5/19: Admitted  Assessment and Plan:  This is a 68 year old female patient initially presenting with AMS secondary to [facility] liver cirrhosis.  She is now on comfort care for her end-stage liver disease.  At this time patient is medically stable awaiting placement at SNF.  If her mental status continues to decline she would be a candidate for inpatient hospice services. Assessment & Plan Hepatic encephalopathy (HCC) Sleeping on exam. LCSW is pursuing placement with residential hospice.  Follow up LCSW  Pressure injury of skin From lactulose  and significant diarrhea and immobility. Improving plan per nursing to catch stool in container and using creams and frequently turning and cleaning.  Physical deconditioning Pending SNF placement recommended by PT and OT. Was declined after peer to peer and repeat PT assessement on 5/30. However LCSW reported that insurance says we can re-apply. See plan of care note 5/30 for other dispo options. Follow up PT / OT assessments Follow up LCSW (including about [facility] resources) Follow up palliative care recommendation End of life care Comfort measures only.  Chronic health problem DMII: Discontinued CBGs and medications HTN: hypotensive Hypothyroidism: Home Synthroid  ordered HLD: Statin held Pain: Morphine Urinary retention: Can replace Foley if needed Anasarca: Continue home diuresis regimen  FEN/GI: Regular PPx: N/A-comfort care Dispo:SNF versus hospice approval.    Subjective:  Patient sleeping on exam  Objective: Temp:  [98 F (36.7 C)-98.6 F (37 C)] 98.6 F (37 C) (06/03 2024) Pulse Rate:  [78-86] 86 (06/03 2024) Resp:  [17-18] 18  (06/03 2024) BP: (106-121)/(49-53) 106/49 (06/03 2024) SpO2:  [99 %-100 %] 100 % (06/03 2024) Physical Exam: General: Ill appearing, no distress Cardiovascular: RRR, no m/r/g Respiratory: CTAB, no increased WOB  Laboratory: Most recent CBC Lab Results  Component Value Date   WBC 4.6 12/18/2023   HGB 11.0 (L) 12/18/2023   HCT 33.3 (L) 12/18/2023   MCV 102.1 (H) 12/18/2023   PLT 68 (L) 12/18/2023   Most recent BMP    Latest Ref Rng & Units 12/18/2023    2:40 AM  BMP  Glucose 70 - 99 mg/dL 147   BUN 8 - 23 mg/dL 10   Creatinine 8.29 - 1.00 mg/dL 5.62   Sodium 130 - 865 mmol/L 134   Potassium 3.5 - 5.1 mmol/L 3.7   Chloride 98 - 111 mmol/L 106   CO2 22 - 32 mmol/L 22   Calcium  8.9 - 10.3 mg/dL 8.1     Rayma Calandra, DO 12/25/2023, 7:53 AM  PGY-1, [place] [facility] FPTS Intern pager: [phone], text pages welcome Secure chat group [facility] Teaching Service

## 2023-12-25 NOTE — Assessment & Plan Note (Addendum)
 Comfort measures only

## 2023-12-25 NOTE — Assessment & Plan Note (Signed)
 Pending SNF placement recommended by PT and OT. Was declined after peer to peer and repeat PT assessement on 5/30. However LCSW reported that insurance says we can re-apply. See plan of care note 5/30 for other dispo options. Follow up PT / OT assessments Follow up LCSW (including about VA resources) Follow up palliative care recommendation

## 2023-12-25 NOTE — Progress Notes (Signed)
 Arlin Benes [phone] - [facility] Liaison Note  Received request from Perry, Transitions of Care Manager, for inpatient hospice care evaluation for [facility].   Visited patient and reviewed chart. Attempted to contact [name] to discuss philosophy and care at inpatient hospice facilities. Was unable to contact Ken by phone and unable to leave a voicemail. Sent text message and we will follow up.   Please call with any questions or concerns.   Thank you for the opportunity to participate in this patient's care.   Madelene Schanz BSN, Charity fundraiser, OCN [employer] [phone]

## 2023-12-25 NOTE — TOC Progression Note (Signed)
 Transition of Care [facility]) - Progression Note    Patient Details  Name: Brianna Wheeler MRN: [id number] Date of Birth: 1956-04-03  Transition [facility] CM/SW Contact  Elspeth Hals, LCSW Phone Number: 12/25/2023, 11:14 AM  Clinical Narrative:   CSW spoke with pt neighbor [name], discussed his understanding of current plan for pt.  [name] confirms he has met with palliative team, aware pt is now comfort care.  Discussed residential hospice and he is in agreement with pursuing this.  Choice discussed and he does want to see if pt could admit to [facility], as they are located in [place].    CSW Barista with referral.     Expected Discharge Plan: Skilled Nursing Facility Barriers to Discharge: Continued Medical Work up, English as a second language teacher  Expected Discharge Plan and Services In-house Referral: Clinical Social Work Discharge Planning Services: CM Consult   Living arrangements for the past 2 months: Single Family Home                                       Social Determinants of Health (SDOH) Interventions SDOH Screenings   Food Insecurity: No Food Insecurity (12/10/2023)  Housing: Low Risk  (12/10/2023)  Transportation Needs: No Transportation Needs (12/10/2023)  Utilities: Not At Risk (12/10/2023)  Alcohol Screen: Low Risk  (09/30/2023)  Depression (PHQ2-9): Low Risk  (09/30/2023)  Financial Resource Strain: Low Risk  (09/30/2023)  Physical Activity: Insufficiently Active (09/30/2023)  Social Connections: Socially Isolated (12/10/2023)  Stress: No Stress Concern Present (09/30/2023)  Tobacco Use: Low Risk  (12/10/2023)  Health Literacy: Adequate Health Literacy (09/30/2023)    Readmission Risk Interventions    11/25/2023    1:00 PM  Readmission Risk Prevention Plan  Transportation Screening Complete  Palliative Care Screening Not Applicable  Medication Review (RN Care Manager) Complete

## 2023-12-25 NOTE — Assessment & Plan Note (Signed)
 DMII: Discontinued CBGs and medications HTN: hypotensive Hypothyroidism: Home Synthroid  ordered HLD: Statin held Pain: Morphine Urinary retention: Can replace Foley if needed Anasarca: Continue home diuresis regimen

## 2023-12-25 NOTE — Assessment & Plan Note (Signed)
 From lactulose  and significant diarrhea and immobility. Improving plan per nursing to catch stool in container and using creams and frequently turning and cleaning.

## 2023-12-26 DIAGNOSIS — Z515 Encounter for palliative care: Secondary | ICD-10-CM | POA: Diagnosis not present

## 2023-12-26 DIAGNOSIS — K7682 Hepatic encephalopathy: Secondary | ICD-10-CM | POA: Diagnosis not present

## 2023-12-26 DIAGNOSIS — K7460 Unspecified cirrhosis of liver: Secondary | ICD-10-CM | POA: Diagnosis not present

## 2023-12-26 DIAGNOSIS — Z7189 Other specified counseling: Secondary | ICD-10-CM | POA: Diagnosis not present

## 2023-12-26 MED ORDER — MORPHINE SULFATE (CONCENTRATE) 10 MG /0.5 ML PO SOLN
7.5000 mg | Freq: Four times a day (QID) | ORAL | Status: DC
  Administered 2023-12-26 – 2023-12-28 (×9): 7.6 mg via ORAL
  Filled 2023-12-26 (×9): qty 0.5

## 2023-12-26 MED ORDER — MORPHINE SULFATE 10 MG/5ML PO SOLN: 7.5000 mg | Freq: Four times a day (QID) | ORAL | Status: DC

## 2023-12-26 MED ORDER — MORPHINE SULFATE (CONCENTRATE) 10 MG /0.5 ML PO SOLN: 10.0000 mg | Freq: Four times a day (QID) | ORAL | Status: DC

## 2023-12-26 NOTE — Progress Notes (Signed)
 Daily Progress Note   Patient Name: Brianna Wheeler       Date: 12/26/2023 DOB: 11-06-1955  Age: 68 y.o. MRN#: [id number] Attending Physician: Azell Boll, MD Primary Care Physician: Kandis Ormond, DO Admit Date: 12/09/2023  Reason for Consultation/Follow-up: Establishing goals of care  Subjective: Medical records reviewed. Per MAR, patient's opioids needs have increased and she has received 4 doses of PRN Roxanol in the past 24 hours. [facility] will continue to evaluate for appropriateness of hospice facility placement.  Patient was assessed at the bedside approximately 30 minutes after last dose of Roxanol. She was sleeping and easily aroused, immediately started grimacing upon waking. She reported a "little bit" of pain. She fatigues easily and goes back to sleep.   Questions and concerns addressed. PMT will continue to support holistically.   Length of Stay: 17   Physical Exam Vitals and nursing note reviewed.  Constitutional:      General: She is not in acute distress.    Appearance: She is ill-appearing.  Cardiovascular:     Rate and Rhythm: Normal rate.  Pulmonary:     Effort: Pulmonary effort is normal.  Skin:    General: Skin is warm and dry.  Neurological:     Mental Status: She is alert. Mental status is at baseline.  Psychiatric:        Behavior: Behavior normal.             Vital Signs: BP (!) 93/37 (BP Location: Right Arm)   Pulse 80   Temp 98.9 F (37.2 C) (Oral)   Resp 18   Ht 5\' 11"  (1.803 m)   Wt (!) 139.1 kg   SpO2 94%   BMI 42.77 kg/m  SpO2: SpO2: 94 % O2 Device: O2 Device: Room Air O2 Flow Rate:        Palliative Assessment/Data: 20-30%   Palliative Care Assessment & Plan   Patient Profile: 68 y.o. female  with past medical history of NASH cirrhosis, thrombocytopenia, smoldering  myeloma, DM2, HTN, hypothyroid, HLD admitted on 12/09/2023 with altered  mental status. Workup revealed likely due to decompensated cirrhosis with elevated ammonia. Palliative medicine consulted for GOC.    Assessment: NASH cirrhosis Hepatic encephalopathy Debility and deconditioning End of life care  Recommendations/Plan: Continue DNR/DNI Continue comfort focused care Scheduled morphine 7.5mg  PO Q6H given patient's increased frequency of PRN use Continue concentrated Roxanol and IV morphine PRN for breakthrough symptoms Psychosocial and emotional support provided PMT will continue to follow and support   Prognosis:  Poor, likely to deteriorate rapidly after discontinuation of lactulose   Discharge Planning: To Be Determined  Care plan was discussed with patient   MDM high         Brian Zeitlin Alroy Jericho, PA-C  Palliative Medicine Team Team phone # [phone]  Thank you for allowing the Palliative Medicine Team to assist in the care of this patient. Please utilize secure chat with additional questions, if there is no response within 30 minutes please call the above phone number.  Palliative Medicine Team providers are available by phone from 7am to 7pm daily and can be reached through the team cell phone.  Should this patient require assistance outside of  these hours, please call the patient's attending physician.

## 2023-12-26 NOTE — Assessment & Plan Note (Addendum)
 Comfort care only, pursuing placement with residential hospice services.  Appears comfortable  Follow up LCSW  Continue comfort medications- Tylenol  as needed, Lorazepam  PRN, and morphine PRN.

## 2023-12-26 NOTE — Assessment & Plan Note (Addendum)
 DMII: Discontinued CBGs and medications Hypothyroidism: Home Synthroid   Urinary retention: Purewick Cirrhosis with  mild ascites: Torsemide  10 mg

## 2023-12-26 NOTE — Progress Notes (Addendum)
     Daily Progress Note Intern Pager: [phone]  Patient name: Brianna Wheeler Medical record number: [id number] Date of birth: Dec 03, 1955 Age: 68 y.o. Gender: female  Primary Care Provider: Kandis Ormond, DO Consultants: Palliative Code Status: DNR-Comfort  Pt Overview and Major Events to Date:  5/19: Admitted  Assessment and Plan:  Is a 68 year old female patient who was initially admitted altered mental status secondary to decompensated Nash liver cirrhosis.  She was initially improved with lactulose  but has now elected to go comfort care for her end-stage liver disease. Assessment & Plan Hepatic encephalopathy (HCC) Comfort care only, pursuing placement with residential hospice services.  Appears comfortable  Follow up LCSW  Continue comfort medications- Tylenol  as needed, Lorazepam  PRN, and morphine PRN.  Pressure injury of skin Stable, continuing with supportive measures. Consider air mattress if wound had further break down. End of life care Comfort measures only.  Chronic health problem DMII: Discontinued CBGs and medications Hypothyroidism: Home Synthroid   Urinary retention: Purewick Cirrhosis with  mild ascites: Torsemide  10 mg    FEN/GI: regular PPx: N/A- comfort care Dispo:[facility] pending bed availability.   Subjective:  Patient more somnolent on exam this morning. Responded to voice with mumbled words, acknowledged pain in her legs.   Objective: Temp:  [98.3 F (36.8 C)-98.9 F (37.2 C)] 98.9 F (37.2 C) (06/05 0446) Pulse Rate:  [80-82] 80 (06/05 0446) Resp:  [18] 18 (06/05 0446) BP: (93-107)/(37-45) 93/37 (06/05 0446) SpO2:  [94 %-99 %] 94 % (06/05 0446) Physical Exam: General: Ill, elderly appearing woman, no distress.  Cardiovascular: RRR, grade 3 systolic murmur.  Respiratory: CTAB, no increased WOB Abdomen: Soft, nontender Extremities: TTP bilaterally, ongoing edema  Laboratory: Most recent CBC Lab Results  Component Value Date    WBC 4.6 12/18/2023   HGB 11.0 (L) 12/18/2023   HCT 33.3 (L) 12/18/2023   MCV 102.1 (H) 12/18/2023   PLT 68 (L) 12/18/2023   Most recent BMP    Latest Ref Rng & Units 12/18/2023    2:40 AM  BMP  Glucose 70 - 99 mg/dL 147   BUN 8 - 23 mg/dL 10   Creatinine 8.29 - 1.00 mg/dL 5.62   Sodium 130 - 865 mmol/L 134   Potassium 3.5 - 5.1 mmol/L 3.7   Chloride 98 - 111 mmol/L 106   CO2 22 - 32 mmol/L 22   Calcium  8.9 - 10.3 mg/dL 8.1    Rayma Calandra, DO 12/26/2023, 8:11 AM  PGY-1, [facility] FPTS Intern pager: [phone], text pages welcome Secure chat group [facility] Teaching Service

## 2023-12-26 NOTE — Progress Notes (Addendum)
 [facility] [phone] [facility] Liaison Note  Visited with patient at bedside. She tracks and nods appropriately to questions, though is drowsy and drifts back to sleep easily.  At this time patient does not appear appropriate for [facility] for symptom management needs. However, she is appropriate for hospice at home or LTC.  Hospital Liaison Team will continue to evaluate for IPU appropriateness and discharge disposition.  Attempted to call Jet[facility] to update, no answer, unable to leave voicemail as mailbox is full.  Please call with any hospice related questions or concerns.  Thank you, Lestine Rathke, BSN, [facility] [phone]

## 2023-12-26 NOTE — Assessment & Plan Note (Addendum)
 Stable, continuing with supportive measures. Consider air mattress if wound had further break down.

## 2023-12-26 NOTE — Assessment & Plan Note (Addendum)
 Comfort measures only

## 2023-12-27 DIAGNOSIS — K7460 Unspecified cirrhosis of liver: Secondary | ICD-10-CM | POA: Diagnosis not present

## 2023-12-27 DIAGNOSIS — K7682 Hepatic encephalopathy: Secondary | ICD-10-CM | POA: Diagnosis not present

## 2023-12-27 DIAGNOSIS — Z515 Encounter for palliative care: Secondary | ICD-10-CM | POA: Diagnosis not present

## 2023-12-27 DIAGNOSIS — Z7189 Other specified counseling: Secondary | ICD-10-CM | POA: Diagnosis not present

## 2023-12-27 NOTE — Progress Notes (Signed)
 [facility] [address] [facility] Liaison Note  Patient has been approved for [facility]. Unfortunately, we do not have a bed to offer today.  We will follow up tomorrow for availability.  Please call with any questions or concerns.  Thank you, Lestine Rathke, BSN, [facility]  [phone]

## 2023-12-27 NOTE — Progress Notes (Signed)
     Daily Progress Note Intern Pager: [phone]  Patient name: Brianna Wheeler Medical record number: [id number] Date of birth: March 27, 1956 Age: 68 y.o. Gender: female  Primary Care Provider: Kandis Ormond, DO Consultants: Palliative care Code Status: DNR Comfort  Pt Overview and Major Events to Date:  5/19: Admitted  Assessment and Plan:  This is a 68 year old female patient who was initially admitted for altered mental status secondary to decompensated Nash cirrhosis.  Patient is now comfort care for her end-stage liver disease but does not yet meet criteria for inpatient hospice services.  Given her financial situation she is not appropriate for LTC at this time and she is unable to receive hospice services at home as she would require round-the-clock care does not currently have anyone who could provide this to her. Assessment & Plan Hepatic encephalopathy (HCC) Mental status continues to decline.  Patient is minimally responsive to voice and touch this morning. It is my medical opinion that she has 2 weeks or less to live. The patient has not eaten or drank anything in several days.  Follow up LCSW  Continue comfort medications- Tylenol  as needed, Lorazepam  PRN, and morphine PRN.  Pressure injury of skin Stable, continuing with supportive measures. Consider air mattress if wound had further break down. End of life care Comfort measures only.  Chronic health problem DMII: Discontinued CBGs and medications Hypothyroidism: Home Synthroid   Urinary retention: Purewick Cirrhosis with  mild ascites: Torsemide  10 mg   FEN/GI: Regular PPx: Not applicable Dispo:Hospice   Subjective:  Peers comfortable, somnolent on exam.  Awoke and groaned to voice and touch however did not respond in any meaningful way to questions.  Objective: Temp:  [98.1 F (36.7 C)-99.3 F (37.4 C)] 98.1 F (36.7 C) (06/06 0739) Pulse Rate:  [79-93] 93 (06/06 0739) Resp:  [16-18] 17 (06/06 0739) BP:  (107-125)/(40-51) 125/40 (06/06 0739) SpO2:  [93 %-98 %] 95 % (06/06 0739) Physical Exam: General: Ill-appearing elderly female, no distress Cardiovascular: RRR, holosystolic murmur Respiratory: CTAB, no increased work of breathing Abdomen: Flat, soft, nontender Extremities: Significant edema due to anasarca  Laboratory: Most recent CBC Lab Results  Component Value Date   WBC 4.6 12/18/2023   HGB 11.0 (L) 12/18/2023   HCT 33.3 (L) 12/18/2023   MCV 102.1 (H) 12/18/2023   PLT 68 (L) 12/18/2023   Most recent BMP    Latest Ref Rng & Units 12/18/2023    2:40 AM  BMP  Glucose 70 - 99 mg/dL 563   BUN 8 - 23 mg/dL 10   Creatinine 8.75 - 1.00 mg/dL 6.43   Sodium 329 - 518 mmol/L 134   Potassium 3.5 - 5.1 mmol/L 3.7   Chloride 98 - 111 mmol/L 106   CO2 22 - 32 mmol/L 22   Calcium  8.9 - 10.3 mg/dL 8.1     Rayma Calandra, DO 12/27/2023, 8:06 AM  PGY-1, [place] [facility] FPTS Intern pager: [phone], text pages welcome Secure chat group [facility] Teaching Service

## 2023-12-27 NOTE — Assessment & Plan Note (Signed)
 DMII: Discontinued CBGs and medications Hypothyroidism: Home Synthroid   Urinary retention: Purewick Cirrhosis with  mild ascites: Torsemide  10 mg

## 2023-12-27 NOTE — Progress Notes (Signed)
                                                     Daily Progress Note   Patient Name: Brianna Wheeler       Date: 12/27/2023 DOB: 12-13-1955  Age: 68 y.o. MRN#: [id number] Attending Physician: Azell Boll, MD Primary Care Physician: Kandis Ormond, DO Admit Date: 12/09/2023  Reason for Consultation/Follow-up: Establishing goals of care  Subjective: Medical records reviewed. Per MAR, patient has not required additional PRN doses of morphine since scheduled doses were initiated. Patient assessed at the bedside. She appears to be transitioning and near EOL, RR about 8 with apneic pauses and pulse about 98. No family present during my visit.  Questions and concerns addressed. PMT will continue to support holistically.   Length of Stay: 18   Physical Exam Vitals and nursing note reviewed.  Constitutional:      General: She is not in acute distress.    Appearance: She is ill-appearing.  Cardiovascular:     Rate and Rhythm: Normal rate.  Pulmonary:     Effort: Pulmonary effort is normal.  Skin:    General: Skin is warm and dry.  Neurological:     Mental Status: She is lethargic.  Psychiatric:        Cognition and Memory: Cognition is impaired.             Vital Signs: BP (!) 125/40 (BP Location: Right Arm)   Pulse 93   Temp 98.1 F (36.7 C) (Axillary)   Resp 17   Ht 5\' 11"  (1.803 m)   Wt (!) 139.1 kg   SpO2 95%   BMI 42.77 kg/m  SpO2: SpO2: 95 % O2 Device: O2 Device: Room Air O2 Flow Rate:        Palliative Assessment/Data: 20-30%   Palliative Care Assessment & Plan   Patient Profile: 68 y.o. female  with past medical history of NASH cirrhosis, thrombocytopenia, smoldering myeloma, DM2, HTN, hypothyroid, HLD admitted on 12/09/2023 with altered  mental status. Workup revealed likely due to decompensated cirrhosis with elevated ammonia. Palliative medicine consulted  for GOC.    Assessment: NASH cirrhosis Hepatic encephalopathy Debility and deconditioning End of life care  Recommendations/Plan: Continue DNR/DNI Continue comfort focused care including scheduled SL morphine, no adjustments required today Patient approaching EOL and currently stable for transfer to hospice facility, though I believe she may soon be out of this window and become too unstable PMT will continue to follow and support   Prognosis: Days  Discharge Planning: Hospice facility vs hospital death    Stefani Baik Alroy Jericho, PA-C  Palliative Medicine Team Team phone # [phone]  Thank you for allowing the Palliative Medicine Team to assist in the care of this patient. Please utilize secure chat with additional questions, if there is no response within 30 minutes please call the above phone number.  Palliative Medicine Team providers are available by phone from 7am to 7pm daily and can be reached through the team cell phone.  Should this patient require assistance outside of these hours, please call the patient's attending physician.

## 2023-12-27 NOTE — Assessment & Plan Note (Addendum)
 Mental status continues to decline.  Patient is minimally responsive to voice and touch this morning. It is my medical opinion that she has 2 weeks or less to live. The patient has not eaten or drank anything in several days.  Follow up LCSW  Continue comfort medications- Tylenol  as needed, Lorazepam  PRN, and morphine PRN.

## 2023-12-27 NOTE — Assessment & Plan Note (Signed)
 Stable, continuing with supportive measures. Consider air mattress if wound had further break down.

## 2023-12-27 NOTE — Assessment & Plan Note (Signed)
 Comfort measures only

## 2023-12-28 DIAGNOSIS — K7682 Hepatic encephalopathy: Secondary | ICD-10-CM | POA: Diagnosis not present

## 2023-12-28 MED ORDER — ARTIFICIAL TEARS OPHTHALMIC OINT
TOPICAL_OINTMENT | Freq: Three times a day (TID) | OPHTHALMIC | Status: DC
  Filled 2023-12-28: qty 3.5

## 2023-12-28 NOTE — Progress Notes (Signed)
                                                                                                                                                                                                          Palliative Medicine Progress Note   Patient Name: Brianna Wheeler       Date: 12/28/2023 DOB: May 28, 1956  Age: 68 y.o. MRN#: [id number] Attending Physician: Azell Boll, MD Primary Care Physician: Kandis Ormond, DO Admit Date: 12/09/2023  Reason for Consultation/Follow-up: {Reason for Consult:23484}  HPI/Patient Profile: 68 y.o. female with past medical history of NASH cirrhosis, thrombocytopenia, smoldering myeloma, DM2, HTN, hypothyroid, HLD admitted on 12/09/2023 with altered mental status. Workup revealed likely due to decompensated cirrhosis with elevated ammonia. Palliative medicine consulted for GOC.   Subjective: ***  Objective:  Physical Exam          Vital Signs: BP (!) 121/46 (BP Location: Left Wrist)   Pulse 91   Temp 99.4 F (37.4 C)   Resp (!) 9   Ht 5\' 11"  (1.803 m)   Wt (!) 139.1 kg   SpO2 94%   BMI 42.77 kg/m  SpO2: SpO2: 94 % O2 Device: O2 Device: Room Air O2 Flow Rate:    Intake/output summary:  Intake/Output Summary (Last 24 hours) at 12/28/2023 1244 Last data filed at 12/27/2023 1843 Gross per 24 hour  Intake --  Output 500 ml  Net -500 ml    LBM: Last BM Date : 12/25/23     Palliative Assessment/Data: ***     Palliative Medicine Assessment & Plan   Assessment: Principal Problem:   Hepatic encephalopathy (HCC) Active Problems:   Physical deconditioning   End of life care   Chronic health problem   Anasarca   Cirrhosis of liver without ascites (HCC)   Pancytopenia (HCC)   Pressure injury of skin    Recommendations/Plan: ***  Goals of Care and Additional Recommendations: Limitations on Scope of Treatment: {Recommended Scope and Preferences:21019}  Code Status:   Prognosis:  {Palliative Care Prognosis:23504}  Discharge  Planning: {Palliative dispostion:23505}  Care plan was discussed with ***  Thank you for allowing the Palliative Medicine Team to assist in the care of this patient.   ***   Wynetta Heckle, NP   Please contact Palliative Medicine Team phone at [phone] for questions and concerns.  For individual providers, please see AMION.

## 2023-12-28 NOTE — Assessment & Plan Note (Addendum)
 DMII: Discontinued CBGs and medications Hypothyroidism: Discontinue Synthroid  Urinary retention: Purewick Cirrhosis with  mild ascites: Discontinue torsemide  10 mg

## 2023-12-28 NOTE — Assessment & Plan Note (Signed)
 Continues to remain minimally responsive and p.o. intake in the last few days.  It is my medical opinion that she has 2 weeks or less to live. The patient has not eaten or drank anything in several days.  Follow up LCSW  Continue comfort medications- Tylenol  as needed, Lorazepam  PRN, and morphine  PRN.

## 2023-12-28 NOTE — Assessment & Plan Note (Signed)
 Comfort measures only

## 2023-12-28 NOTE — Assessment & Plan Note (Addendum)
 Patient remains minimally responsive to voice and touch this morning. It is my medical opinion that she has 2 weeks or less to live. The patient has not eaten or drank anything in several days.  Follow up LCSW  Continue comfort medications- Tylenol  as needed, Lorazepam  PRN, and morphine  PRN.

## 2023-12-28 NOTE — Assessment & Plan Note (Signed)
 Stable, continuing with supportive measures. Consider air mattress if wound had further break down.

## 2023-12-28 NOTE — TOC Progression Note (Signed)
 Transition of Care [facility]) - Initial/Assessment Note    Patient Details  Name: Brianna Wheeler MRN: [id number] Date of Birth: June 30, 1956  Transition of Care [facility]) CM/SW Contact:    Maya Sparrow, LCSW Phone Number: 12/28/2023, 9:22 AM  Clinical Narrative:                 CSW informed by Jacqlyn Matas with [facility] that the facility does not have a bed available today.  [facility] will continue to follow and inform CSW when a bed is available.  TOC will continue to follow.    Expected Discharge Plan: Skilled Nursing Facility Barriers to Discharge: Continued Medical Work up, English as a second language teacher   Patient Goals and CMS Choice Patient states their goals for this hospitalization and ongoing recovery are:: SNF          Expected Discharge Plan and Services In-house Referral: Clinical Social Work Discharge Planning Services: CM Consult   Living arrangements for the past 2 months: Single Family Home                                      Prior Living Arrangements/Services Living arrangements for the past 2 months: Single Family Home Lives with:: Adult Children Patient language and need for interpreter reviewed:: Yes Do you feel safe going back to the place where you live?: Yes      Need for Family Participation in Patient Care: No (Comment) Care giver support system in place?: No (comment)   Criminal Activity/Legal Involvement Pertinent to Current Situation/Hospitalization: No - Comment as needed  Activities of Daily Living   ADL Screening (condition at time of admission) Independently performs ADLs?: No Does the patient have a NEW difficulty with bathing/dressing/toileting/self-feeding that is expected to last >3 days?: No Does the patient have a NEW difficulty with getting in/out of bed, walking, or climbing stairs that is expected to last >3 days?: No Does the patient have a NEW difficulty with communication that is expected to last >3 days?: No Is the  patient deaf or have difficulty hearing?: No Does the patient have difficulty seeing, even when wearing glasses/contacts?: Yes Does the patient have difficulty concentrating, remembering, or making decisions?: No  Permission Sought/Granted Permission sought to share information with : Facility Medical sales representative, Case Estate manager/land agent granted to share information with : Yes, Verbal Permission Granted     Permission granted to share info w AGENCY: [facility]        Emotional Assessment Appearance:: Appears stated age Attitude/Demeanor/Rapport: Engaged Affect (typically observed): Flat Orientation: : Oriented to  Time, Oriented to Place, Oriented to Self Alcohol  / Substance Use: Not Applicable Psych Involvement: No (comment)  Admission diagnosis:  Hypokalemia [E87.6] Hypomagnesemia [E83.42] Altered mental status, unspecified altered mental status type [R41.82] AMS (altered mental status) [R41.82] Patient Active Problem List   Diagnosis Date Noted   Person awaiting admission to adequate facility elsewhere 12/21/2023   Nausea 12/21/2023   Pressure injury of skin 12/20/2023   Hyperammonemia (HCC) 12/09/2023   Hepatic encephalopathy (HCC) 12/09/2023   Pancytopenia (HCC) 12/09/2023   Anemia 11/19/2023   Vaginal bleeding 11/16/2023   Vitamin D  deficiency 11/11/2023   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   High serum transferrin saturation 11/08/2023   Cirrhosis of liver without ascites (HCC) 11/07/2023   Chronic health problem 11/06/2023   Possible Portal vein/Splenic vein thrombosis on CT 11/06/2023   Anasarca 11/06/2023   Left eye  Retinal Detachment 11/06/2023   Calcification of lens, Left 11/06/2023   Eosinophilia, unspecified 11/06/2023   Thrombocytopenia (HCC) 11/06/2023   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   Systolic murmur 11/06/2023   Right knee pain 04/29/2023   Tremor of both hands 11/02/2022   Headache 04/05/2022   Frequent falls 09/13/2021   Morbid obesity (HCC)  12/08/2020   Adjustment reaction with mixed emotional features 09/12/2020   Serum gammaglobulin increased 02/11/2019   Chronic venous insufficiency 01/28/2019   Nocturia 01/28/2019   GERD (gastroesophageal reflux disease) 05/01/2016   Vertebral compression fracture (HCC) 12/09/2015   Metatarsalgia of both feet 09/20/2015   Allergic rhinitis 08/30/2015   Transaminitis 04/29/2015   Cataract 01/01/2013   Back pain 06/10/2012   End of life care 02/11/2012   Asthma 12/07/2011   Depression 05/10/2011   Chronic sinusitis 09/20/2010   Physical deconditioning 08/28/2010   Osteoarthritis 04/07/2009   Hypothyroidism 03/03/2008   Lumbar back pain with radiculopathy affecting right lower extremity 10/03/2007   Prediabetes 05/08/2007   HLD (hyperlipidemia) 09/19/2006   HYPERTENSION, BENIGN SYSTEMIC 09/19/2006   PCP:  Kandis Ormond, DO Pharmacy:   [facility] [address], [place] - [address] [place] [address] Phone: [phone] Fax: [phone]  [place] [facility] [address] [place] [address] Phone: [phone] Fax: [phone]  [facility] [place] [address] [place] [address] Phone: [phone] Fax: [phone]  [facility] [address] [place] [address] Phone: [phone] Fax: [phone]     Social Drivers of Health (SDOH) Social History: SDOH Screenings   Food Insecurity: No Food Insecurity (12/10/2023)  Housing: Low Risk  (12/10/2023)  Transportation Needs: No Transportation Needs (12/10/2023)  Utilities: Not At Risk (12/10/2023)  Alcohol  Screen: Low Risk  (09/30/2023)  Depression (PHQ2-9): Low Risk  (09/30/2023)  Financial Resource Strain: Low Risk  (09/30/2023)  Physical Activity: Insufficiently Active (09/30/2023)  Social Connections: Socially Isolated  (12/10/2023)  Stress: No Stress Concern Present (09/30/2023)  Tobacco Use: Low Risk  (12/10/2023)  Health Literacy: Adequate Health Literacy (09/30/2023)   SDOH Interventions:     Readmission Risk Interventions    11/25/2023    1:00 PM  Readmission Risk Prevention Plan  Transportation Screening Complete  Palliative Care Screening Not Applicable  Medication Review (RN Care Manager) Complete

## 2023-12-28 NOTE — Assessment & Plan Note (Signed)
 DMII: Discontinued CBGs and medications Hypothyroidism: Discontinue Synthroid  Urinary retention: Purewick Cirrhosis with  mild ascites: Discontinue torsemide  10 mg

## 2023-12-28 NOTE — Plan of Care (Signed)
  Problem: Nutrition: Goal: Adequate nutrition will be maintained Outcome: Progressing   Problem: Elimination: Goal: Will not experience complications related to urinary retention Outcome: Progressing   Problem: Pain Managment: Goal: General experience of comfort will improve and/or be controlled Outcome: Progressing   Problem: Safety: Goal: Ability to remain free from injury will improve Outcome: Progressing   Problem: Pain Management: Goal: Satisfaction with pain management regimen will improve Outcome: Progressing

## 2023-12-28 NOTE — Progress Notes (Signed)
     Daily Progress Note Intern Pager: [phone]  Patient name: Brianna Wheeler Medical record number: [id number] Date of birth: May 31, 1956 Age: 68 y.o. Gender: female  Primary Care Provider: Kandis Ormond, DO Consultants: Palliative Code Status: DNR comfort  Pt Overview and Major Events to Date:  5/19 admitted  Assessment and Plan:  Brianna Wheeler is a 68 year old female admitted for altered mental status secondary to decompensated Nash cirrhosis.  Patient is now comfort care for her end-stage liver disease.  She has been approved for hospice services at [facility]-pending bed availability Assessment & Plan Hepatic encephalopathy (HCC) Patient remains minimally responsive to voice and touch this morning. It is my medical opinion that she has 2 weeks or less to live. The patient has not eaten or drank anything in several days.  Follow up LCSW  Continue comfort medications- Tylenol  as needed, Lorazepam  PRN, and morphine  PRN.  Pressure injury of skin Stable, continuing with supportive measures. Consider air mattress if wound had further break down. End of life care Comfort measures only.  Chronic health problem DMII: Discontinued CBGs and medications Hypothyroidism: Discontinue Synthroid  Urinary retention: Purewick Cirrhosis with  mild ascites: Discontinue torsemide  10 mg   FEN/GI: Regular PPx: Not applicable Dispo: Hospice  Subjective:  Patient is resting comfortably in bed.  Awakes and groans to voice and touch, but does not respond in any meaningful way to questions.  Falls back asleep quickly.  Objective: Temp:  [98.1 F (36.7 C)-98.7 F (37.1 C)] 98.2 F (36.8 C) (06/07 0429) Pulse Rate:  [90-93] 93 (06/07 0429) Resp:  [17-18] 18 (06/07 0429) BP: (111-125)/(40-75) 122/75 (06/07 0429) SpO2:  [94 %-100 %] 100 % (06/07 0429) Physical Exam: General: Ill-appearing, NAD Cardiovascular: RRR, systolic murmur Respiratory: CTAB, normal work of breathing Abdomen:  Flat soft nontender, nondistended Extremities: Significant edema due to anasarca, minimally moves upper extremities  Laboratory: Most recent CBC Lab Results  Component Value Date   WBC 4.6 12/18/2023   HGB 11.0 (L) 12/18/2023   HCT 33.3 (L) 12/18/2023   MCV 102.1 (H) 12/18/2023   PLT 68 (L) 12/18/2023   Most recent BMP    Latest Ref Rng & Units 12/18/2023    2:40 AM  BMP  Glucose 70 - 99 mg/dL 629   BUN 8 - 23 mg/dL 10   Creatinine 5.28 - 1.00 mg/dL 4.13   Sodium 244 - 010 mmol/L 134   Potassium 3.5 - 5.1 mmol/L 3.7   Chloride 98 - 111 mmol/L 106   CO2 22 - 32 mmol/L 22   Calcium  8.9 - 10.3 mg/dL 8.1    Lavada Porteous, DO 12/28/2023, 5:17 AM  PGY-2,  Family Medicine FPTS Intern pager: [phone], text pages welcome Secure chat group [facility] Teaching Service

## 2023-12-28 NOTE — Progress Notes (Signed)
     Daily Progress Note Intern Pager: [phone]  Patient name: Brianna Wheeler Medical record number: [id number] Date of birth: 1956-04-21 Age: 68 y.o. Gender: female  Primary Care Provider: Kandis Ormond, DO Consultants: Palliative Code Status: DNR-comfort  Pt Overview and Major Events to Date:  5/19 admitted  Assessment and Plan: Dhamar Gregory is a 68 year old female admitted for AMS 2/2 decompensated NASH cirrhosis and now comfort care.  She has been approved for hospice services at [facility] pending bed availability. Assessment & Plan Hepatic encephalopathy (HCC) Continues to remain minimally responsive and p.o. intake in the last few days.  Expect she is approaching the end of life. It is my medical opinion that she has 2 weeks or less to live. The patient has not eaten or drank anything in several days.  Follow up with TOC/Hospice regarding bed availability at [facility]  Continue comfort medications- Tylenol  as needed, Lorazepam  PRN, and morphine  PRN.  Pressure injury of skin Stable, continuing with supportive measures. Consider air mattress if wound had further break down. End of life care Comfort measures only.    FEN/GI: Regular  PPx: Not applicable Dispo:[facility] vs hospital death pending bed availability at [facility]  Subjective:  Seen on rounds. Appears comfortable. Doesn't engage.   Objective: Temp:  [99.2 F (37.3 C)-99.4 F (37.4 C)] 99.2 F (37.3 C) (06/07 2011) Pulse Rate:  [85-91] 85 (06/07 2011) Resp:  [9-18] 18 (06/07 2011) BP: (121)/(46-48) 121/48 (06/07 2011) SpO2:  [94 %-96 %] 96 % (06/07 2011) Physical Exam: General: Appears near the end of life  Cardiovascular: Regular rate and rhythm Respiratory: No increased WOB or tahcypnea  Laboratory: Most recent CBC Lab Results  Component Value Date   WBC 4.6 12/18/2023   HGB 11.0 (L) 12/18/2023   HCT 33.3 (L) 12/18/2023   MCV 102.1 (H) 12/18/2023   PLT 68 (L) 12/18/2023    Most recent BMP    Latest Ref Rng & Units 12/18/2023    2:40 AM  BMP  Glucose 70 - 99 mg/dL 130   BUN 8 - 23 mg/dL 10   Creatinine 8.65 - 1.00 mg/dL 7.84   Sodium 696 - 295 mmol/L 134   Potassium 3.5 - 5.1 mmol/L 3.7   Chloride 98 - 111 mmol/L 106   CO2 22 - 32 mmol/L 22   Calcium  8.9 - 10.3 mg/dL 8.1     Imaging/Diagnostic Tests: No new images   Limmie Ren, MD 12/29/2023, 5:32 AM  PGY-3, [facility] FPTS Intern pager: [phone], text pages welcome Secure chat group [facility] Teaching Service

## 2023-12-28 NOTE — Assessment & Plan Note (Deleted)
 Pending SNF placement recommended by PT and OT. Was declined after peer to peer and repeat PT assessement on 5/30. However LCSW reported that insurance says we can re-apply. See plan of care note 5/30 for other dispo options. Follow up PT / OT assessments Follow up LCSW (including about [facility] resources) Follow up palliative care recommendation

## 2023-12-29 DIAGNOSIS — K7460 Unspecified cirrhosis of liver: Secondary | ICD-10-CM | POA: Diagnosis not present

## 2023-12-29 MED ORDER — MORPHINE SULFATE (CONCENTRATE) 10 MG /0.5 ML PO SOLN
7.5000 mg | ORAL | Status: DC
  Administered 2023-12-29 – 2023-12-30 (×7): 7.6 mg via ORAL
  Filled 2023-12-29 (×6): qty 0.5

## 2023-12-29 NOTE — TOC Progression Note (Signed)
 Transition of Care [facility]) - Initial/Assessment Note    Patient Details  Name: Brianna Wheeler MRN: [id number] Date of Birth: 1956-07-07  Transition of Care [facility]) CM/SW Contact:    Maya Sparrow, LCSW Phone Number: 12/29/2023, 9:02 AM  Clinical Narrative:                 CSW contacted [facility].  They do not have a bed available today.  TOC will continue to follow.   Expected Discharge Plan: Skilled Nursing Facility Barriers to Discharge: Continued Medical Work up, English as a second language teacher   Patient Goals and CMS Choice Patient states their goals for this hospitalization and ongoing recovery are:: SNF          Expected Discharge Plan and Services In-house Referral: Clinical Social Work Discharge Planning Services: CM Consult   Living arrangements for the past 2 months: Single Family Home                                      Prior Living Arrangements/Services Living arrangements for the past 2 months: Single Family Home Lives with:: Adult Children Patient language and need for interpreter reviewed:: Yes Do you feel safe going back to the place where you live?: Yes      Need for Family Participation in Patient Care: No (Comment) Care giver support system in place?: No (comment)   Criminal Activity/Legal Involvement Pertinent to Current Situation/Hospitalization: No - Comment as needed  Activities of Daily Living   ADL Screening (condition at time of admission) Independently performs ADLs?: No Does the patient have a NEW difficulty with bathing/dressing/toileting/self-feeding that is expected to last >3 days?: No Does the patient have a NEW difficulty with getting in/out of bed, walking, or climbing stairs that is expected to last >3 days?: No Does the patient have a NEW difficulty with communication that is expected to last >3 days?: No Is the patient deaf or have difficulty hearing?: No Does the patient have difficulty seeing, even when wearing  glasses/contacts?: Yes Does the patient have difficulty concentrating, remembering, or making decisions?: No  Permission Sought/Granted Permission sought to share information with : Facility Medical sales representative, Case Estate manager/land agent granted to share information with : Yes, Verbal Permission Granted     Permission granted to share info w AGENCY: [facility]        Emotional Assessment Appearance:: Appears stated age Attitude/Demeanor/Rapport: Engaged Affect (typically observed): Flat Orientation: : Oriented to  Time, Oriented to Place, Oriented to Self Alcohol  / Substance Use: Not Applicable Psych Involvement: No (comment)  Admission diagnosis:  Hypokalemia [E87.6] Hypomagnesemia [E83.42] Altered mental status, unspecified altered mental status type [R41.82] AMS (altered mental status) [R41.82] Patient Active Problem List   Diagnosis Date Noted   Person awaiting admission to adequate facility elsewhere 12/21/2023   Nausea 12/21/2023   Pressure injury of skin 12/20/2023   Hyperammonemia (HCC) 12/09/2023   Hepatic encephalopathy (HCC) 12/09/2023   Pancytopenia (HCC) 12/09/2023   Anemia 11/19/2023   Vaginal bleeding 11/16/2023   Vitamin D  deficiency 11/11/2023   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   High serum transferrin saturation 11/08/2023   Cirrhosis of liver without ascites (HCC) 11/07/2023   Chronic health problem 11/06/2023   Possible Portal vein/Splenic vein thrombosis on CT 11/06/2023   Anasarca 11/06/2023   Left eye Retinal Detachment 11/06/2023   Calcification of lens, Left 11/06/2023   Eosinophilia, unspecified 11/06/2023   Thrombocytopenia (HCC) 11/06/2023  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   Systolic murmur 11/06/2023   Right knee pain 04/29/2023   Tremor of both hands 11/02/2022   Headache 04/05/2022   Frequent falls 09/13/2021   Morbid obesity (HCC) 12/08/2020   Adjustment reaction with mixed emotional features 09/12/2020   Serum gammaglobulin  increased 02/11/2019   Chronic venous insufficiency 01/28/2019   Nocturia 01/28/2019   GERD (gastroesophageal reflux disease) 05/01/2016   Vertebral compression fracture (HCC) 12/09/2015   Metatarsalgia of both feet 09/20/2015   Allergic rhinitis 08/30/2015   Transaminitis 04/29/2015   Cataract 01/01/2013   Back pain 06/10/2012   End of life care 02/11/2012   Asthma 12/07/2011   Depression 05/10/2011   Chronic sinusitis 09/20/2010   Physical deconditioning 08/28/2010   Osteoarthritis 04/07/2009   Hypothyroidism 03/03/2008   Lumbar back pain with radiculopathy affecting right lower extremity 10/03/2007   Prediabetes 05/08/2007   HLD (hyperlipidemia) 09/19/2006   HYPERTENSION, BENIGN SYSTEMIC 09/19/2006   PCP:  Kandis Ormond, DO Pharmacy:   [facility] [address] [place] - [address] [place] [address] Phone: [phone] Fax: [phone]  [place] [facility] [address] [place] [address] Phone: [phone] Fax: [phone]  [facility] [place], [place] [address] [place] [address] Phone: [phone] Fax: [phone]  [facility] [address] [place] [address] Phone: [phone] Fax: [phone]     Social Drivers of Health (SDOH) Social History: SDOH Screenings   Food Insecurity: No Food Insecurity (12/10/2023)  Housing: Low Risk  (12/10/2023)  Transportation Needs: No Transportation Needs (12/10/2023)  Utilities: Not At Risk (12/10/2023)  Alcohol  Screen: Low Risk  (09/30/2023)  Depression (PHQ2-9): Low Risk  (09/30/2023)  Financial Resource Strain: Low Risk  (09/30/2023)  Physical Activity: Insufficiently Active (09/30/2023)  Social Connections: Socially Isolated (12/10/2023)  Stress: No Stress Concern Present (09/30/2023)  Tobacco Use: Low Risk  (12/10/2023)  Health  Literacy: Adequate Health Literacy (09/30/2023)   SDOH Interventions:     Readmission Risk Interventions    11/25/2023    1:00 PM  Readmission Risk Prevention Plan  Transportation Screening Complete  Palliative Care Screening Not Applicable  Medication Review (RN Care Manager) Complete

## 2023-12-29 NOTE — Progress Notes (Signed)
 [facility] [phone] [facility] Liaison Note   Patient has been approved for [facility]. Unfortunately, we do not have a bed to offer today.   We will follow up tomorrow for availability.   Please call with any questions or concerns.    Jacqlyn Matas, BSN, [employer]           [phone]

## 2023-12-29 NOTE — Progress Notes (Signed)
                                                                                                                                                                                                          Palliative Medicine Progress Note   Patient Name: Brianna Wheeler       Date: 12/29/2023 DOB: 10/22/1955  Age: 68 y.o. MRN#: [id number] Attending Physician: Azell Boll, MD Primary Care Physician: Kandis Ormond, DO Admit Date: 12/09/2023   HPI/Patient Profile: 68 y.o. female with past medical history of NASH cirrhosis, thrombocytopenia, smoldering myeloma, DM2, HTN, hypothyroid, HLD admitted on 12/09/2023 with altered mental status. Workup revealed likely due to decompensated cirrhosis with elevated ammonia. Palliative medicine consulted for GOC.   Subjective: Chart reviewed.  Per MAR, patient has not required additional as needed doses of morphine  since scheduled doses were initiated.   Patient assessed at bedside. She is unresponsive to voice. She appears mildly uncomfortable, with some facial grimacing noted. No family at bedside.    Objective:  Physical Exam Vitals reviewed.  Constitutional:      General: She is not in acute distress.    Appearance: She is ill-appearing.     Comments: anasarca  Pulmonary:     Effort: No respiratory distress.  Neurological:     Mental Status: She is unresponsive.             Palliative Medicine Assessment & Plan   Assessment: Principal Problem:   Hepatic encephalopathy (HCC) Active Problems:   Physical deconditioning   End of life care   Chronic health problem   Anasarca   Cirrhosis of liver without ascites (HCC)   Pancytopenia (HCC)   Pressure injury of skin   Person awaiting admission to adequate facility elsewhere   Nausea    Recommendations/Plan: Continue comfort care Increased frequency of scheduled sublingual morphine  to every 4 hours Awaiting a bed at [facility] PMT will continue to follow  Code Status: DNR -  comfort   Prognosis:  days  Discharge Planning: Hospice facility versus hospital death  Thank you for allowing the Palliative Medicine Team to assist in the care of this patient.   MDM - [place]   Wynetta Heckle, NP   Please contact Palliative Medicine Team phone at [phone] for questions and concerns.  For individual providers, please see AMION.

## 2023-12-30 DIAGNOSIS — Z515 Encounter for palliative care: Secondary | ICD-10-CM | POA: Diagnosis not present

## 2023-12-30 NOTE — TOC Transition Note (Signed)
 Transition of Care [facility]) - Discharge Note   Patient Details  Name: Brianna Wheeler MRN: [id number] Date of Birth: [birth year]  [facility] CM/SW Contact:  Elspeth Hals, LCSW Phone Number: 12/30/2023, 3:03 PM   Clinical Narrative:   Pt discharging to [facility].  RN call report to [phone].  Son [name] scheduled to sign consents at [facility] after 5pm.    Pt has been placed on will call status with [facility].  Please call them at [phone] once [facility] has confirmed that consents are signed.     Final next level of care: Hospice Medical Facility Barriers to Discharge: Barriers Resolved   Patient Goals and CMS Choice Patient states their goals for this hospitalization and ongoing recovery are:: SNF          Discharge Placement              Patient chooses bed at:  [facility]) Patient to be transferred to facility by: [facility] Name of family member notified: son [name] Patient and family notified of of transfer: 12/30/23  Discharge Plan and Services Additional resources added to the After Visit Summary for   In-house Referral: Clinical Social Work Discharge Planning Services: CM Consult                                 Social Drivers of Health (SDOH) Interventions SDOH Screenings   Food Insecurity: No Food Insecurity (12/10/2023)  Housing: Low Risk  (12/10/2023)  Transportation Needs: No Transportation Needs (12/10/2023)  Utilities: Not At Risk (12/10/2023)  Alcohol  Screen: Low Risk  (09/30/2023)  Depression (PHQ2-9): Low Risk  (09/30/2023)  Financial Resource Strain: Low Risk  (09/30/2023)  Physical Activity: Insufficiently Active (09/30/2023)  Social Connections: Socially Isolated (12/10/2023)  Stress: No Stress Concern Present (09/30/2023)  Tobacco Use: Low Risk  (12/10/2023)  Health Literacy: Adequate Health Literacy (09/30/2023)     Readmission Risk Interventions    11/25/2023    1:00 PM  Readmission Risk  Prevention Plan  Transportation Screening Complete  Palliative Care Screening Not Applicable  Medication Review (RN Care Manager) Complete

## 2023-12-30 NOTE — Plan of Care (Signed)
  Problem: Pain Managment: Goal: General experience of comfort will improve and/or be controlled Outcome: Progressing

## 2023-12-30 NOTE — Assessment & Plan Note (Addendum)
 Stable, continue supportive measures.  Can consider air mattress if wound appears to be worsening.

## 2023-12-30 NOTE — Progress Notes (Addendum)
     Daily Progress Note Intern Pager: [phone]  Patient name: Brianna Wheeler Medical record number: [id number] Date of birth: 09-03-1955 Age: 68 y.o. Gender: female  Primary Care Provider: Kandis Ormond, DO Consultants: Palliative Code Status: DNR-comfort  Pt Overview and Major Events to Date:  5/19 admitted   Assessment and Plan: Brianna Wheeler is a 68 year old female with history of, cytopenia, smoldering myeloma, DM 2, HTN, hypothyroid, HLD admitted for altered mental status in the setting of decompensated NASH cirrhosis who is now comfort care.  Patient is medically stable and is being followed by palliative care. Approved for hospice with [facility], awaiting bed availability.  Assessment & Plan Hepatic encephalopathy (HCC) Continues to be less responsive to voice with poor p.o. intake.  Per palliative, prognosis estimated to be days. --Continue to follow-up with TOC regarding bed availability at hospice facility -- Continue comfort care medications, Tylenol , lorazepam , morphine  as needed  Pressure injury of skin Stable, continue supportive measures.  Can consider air mattress if wound appears to be worsening. End of life care Comfort measures only.  Chronic health problem Type II DM: Discontinued CBGs, meds Hypothyroidism: Discontinued Synthroid  Urinary retention: Purewick Cirrhosis with mild ascites: Discontinued torsemide    FEN/GI: Regular PPx: N/A Dispo: [facility] versus hospital death  Subjective:  Seen on rounds, appears comfortable, not responsive  Objective: Temp:  [99 F (37.2 C)] 99 F (37.2 C) (06/08 2100) Pulse Rate:  [108-110] 110 (06/08 2100) Resp:  [16] 16 (06/08 2100) BP: (118-121)/(49-50) 118/49 (06/08 2100) SpO2:  [92 %-94 %] 92 % (06/08 2100) Physical Exam: General: Sitting up in bed, eyes open, nonresponsive Cardiovascular: RRR, normal S1/S2 Respiratory: Normal WOB on RA   Laboratory: Most recent CBC Lab Results  Component  Value Date   WBC 4.6 12/18/2023   HGB 11.0 (L) 12/18/2023   HCT 33.3 (L) 12/18/2023   MCV 102.1 (H) 12/18/2023   PLT 68 (L) 12/18/2023   Most recent BMP    Latest Ref Rng & Units 12/18/2023    2:40 AM  BMP  Glucose 70 - 99 mg/dL 147   BUN 8 - 23 mg/dL 10   Creatinine 8.29 - 1.00 mg/dL 5.62   Sodium 130 - 865 mmol/L 134   Potassium 3.5 - 5.1 mmol/L 3.7   Chloride 98 - 111 mmol/L 106   CO2 22 - 32 mmol/L 22   Calcium  8.9 - 10.3 mg/dL 8.1     Imaging/Diagnostic Tests: None  [name], MD 12/30/2023, 7:07 AM  PGY-1,  Family Medicine FPTS Intern pager: [phone], text pages welcome Secure chat group [facility] Teaching Service

## 2023-12-30 NOTE — Progress Notes (Signed)
                                                                                                                                                                                                          Palliative Medicine Progress Note   Patient Name: Brianna Wheeler       Date: 12/30/2023 DOB: 11/14/1955  Age: 68 y.o. MRN#: [id number] Attending Physician: Azell Boll, MD Primary Care Physician: Kandis Ormond, DO Admit Date: 12/09/2023  Reason for Follow-up: end-of-life care  HPI/Patient Profile: 68 y.o. female with past medical history of NASH cirrhosis, thrombocytopenia, smoldering myeloma, DM2, HTN, hypothyroid, HLD admitted on 12/09/2023 with altered mental status. Workup revealed likely due to decompensated cirrhosis with elevated ammonia. Palliative medicine consulted for GOC   Subjective: Chart reviewed. Per MAR, patient received 1 dose of prn morphine  within the last 24 hours.  Patient assessed at bedside.  She is unresponsive to voice.  She appears comfortable.  Respirations are even and unlabored.  No excessive respiratory secretions noted.  No family at bedside.  Notified by hospice liaison [facility] has a bed today. Plan for discharge this evening.    Objective:  Physical Exam Vitals reviewed.  Constitutional:      General: She is not in acute distress.    Appearance: She is ill-appearing.     Comments: anasarca  Pulmonary:     Effort: No respiratory distress.  Neurological:     Mental Status: She is unresponsive.              Palliative Medicine Assessment & Plan   Assessment: Principal Problem:   Hepatic encephalopathy (HCC) Active Problems:   Physical deconditioning   End of life care   Chronic health problem   Anasarca   Cirrhosis of liver without ascites (HCC)   Pancytopenia (HCC)   Pressure injury of skin   Person awaiting admission to adequate facility elsewhere   Nausea    Recommendations/Plan: Continue comfort care Continue scheduled  sublingual morphine  every 4 hours [facility] PMT will continue to follow   Code Status: DNR - comfort   Prognosis:  days  Discharge Planning: Hospice facility   Thank you for allowing the Palliative Medicine Team to assist in the care of this patient.   MDM - High   Wynetta Heckle, NP   Please contact Palliative Medicine Team phone at [phone] for questions and concerns.  For individual providers, please see AMION.

## 2023-12-30 NOTE — Progress Notes (Signed)
 Patient discharging to hospice [facility], report given to nurse Gabriel John at [facility].

## 2023-12-30 NOTE — Assessment & Plan Note (Addendum)
 Type II DM: Discontinued CBGs, meds Hypothyroidism: Discontinued Synthroid  Urinary retention: Purewick Cirrhosis with mild ascites: Discontinued torsemide 

## 2023-12-30 NOTE — TOC Progression Note (Signed)
 Transition of Care [facility]) - Progression Note    Patient Details  Name: Brianna Wheeler MRN: [id number] Date of Birth: [birth year]  [facility] CM/SW Contact  Elspeth Hals, LCSW Phone Number: 12/30/2023, 2:50 PM  Clinical Narrative:   CSW informed by Melissa/[facility] that bed available at [facility] today, unable to reach neighbor [name] to arrange for signing of consents.  CSW also unable to reach [name], did reach pt son Brianna Wheeler.  Brianna Wheeler and [name] did connect with Melissa,will sign consents after 5pm.  Transport with [facility] will be arranged on will-call status, to move forward after consents are signed.  MD informed.    Expected Discharge Plan: Skilled Nursing Facility Barriers to Discharge: Continued Medical Work up, English as a second language teacher  Expected Discharge Plan and Services In-house Referral: Clinical Social Work Discharge Planning Services: CM Consult   Living arrangements for the past 2 months: Single Family Home                                       Social Determinants of Health (SDOH) Interventions SDOH Screenings   Food Insecurity: No Food Insecurity (12/10/2023)  Housing: Low Risk  (12/10/2023)  Transportation Needs: No Transportation Needs (12/10/2023)  Utilities: Not At Risk (12/10/2023)  Alcohol  Screen: Low Risk  (09/30/2023)  Depression (PHQ2-9): Low Risk  (09/30/2023)  Financial Resource Strain: Low Risk  (09/30/2023)  Physical Activity: Insufficiently Active (09/30/2023)  Social Connections: Socially Isolated (12/10/2023)  Stress: No Stress Concern Present (09/30/2023)  Tobacco Use: Low Risk  (12/10/2023)  Health Literacy: Adequate Health Literacy (09/30/2023)    Readmission Risk Interventions    11/25/2023    1:00 PM  Readmission Risk Prevention Plan  Transportation Screening Complete  Palliative Care Screening Not Applicable  Medication Review (RN Care Manager) Complete

## 2023-12-30 NOTE — Assessment & Plan Note (Addendum)
 Comfort measures only

## 2023-12-30 NOTE — Assessment & Plan Note (Addendum)
 Continues to be less responsive to voice with poor p.o. intake.  Per palliative, prognosis estimated to be days. --Continue to follow-up with TOC regarding bed availability at hospice facility -- Continue comfort care medications, Tylenol , lorazepam , morphine  as needed

## 2023-12-30 NOTE — Discharge Summary (Cosign Needed Addendum)
 Family Medicine Teaching [facility] Discharge Summary  Patient name: Brianna Wheeler Medical record number: [id number] Date of birth: [birth year] Age: 68 y.o. Gender: female Date of Admission: 12/09/2023  Date of Discharge: 12/30/2023 Admitting Physician: Charise Companion, MD  Primary Care Provider: Kandis Ormond, DO Consultants: Palliative  Indication for Hospitalization: AMS  Discharge Diagnoses/Problem List:  Principal Problem for Admission: Hepatic encephalopathy Other Problems addressed during stay:  Principal Problem:   Hepatic encephalopathy (HCC) Active Problems:   Cirrhosis of liver without ascites (HCC)   Physical deconditioning   End of life care   Chronic health problem   Anasarca   Pancytopenia (HCC)   Pressure injury of skin   Person awaiting admission to adequate facility elsewhere   Nausea    Brief Hospital Course:  Brianna Wheeler is a 68 y.o.female with a history of NASH cirrhosis, type 2 diabetes, hypertension, hypothyroidism, hyperlipidemia, HLD and portal venous thrombosis who was admitted to the [facility] Teaching Service at [facility] for metabolic encephalopathy.   Her hospital course is detailed below:  Metabolic encephalopathy, cirrhosis Patient presented from SNF with altered mental status, hypokalemia and hyperammonemia which were discovered at her facility.  On arrival she unresponsive and nonverbal, her vital signs were stable she was without fever and she had a nonfocal neurologic exam.  Lactic acid was 3.0 and her white blood cell count was 3.7.  CT head and MRI brain were ordered with no acute intracranial processes found on imaging.  CT abdomen pelvis was obtained which showed her known hepatic cirrhosis with portal hypertension as well as mild cardiomegaly, ascites and splenomegaly.  Started on lactulose  and titrated to 40 g four times daily with improvement.  She received rifaximin  while hospitalized, but unfortunately this was determined to  be unaffordable in the outpatient setting.  Patient had multiple conversations with Palliative Care and ultimately decided to transition to comfort care. She remained in the hospital while placement with an appropriate facility was located. As her condition deteriorated, she was evaluated as a candidate for hospice services. She received a bed offer at [facility] and discharged on 6/9.  Tachycardia Unclear causes. Considered  PE given risk factors but CTA PE was negative. No evidence of infection including SBP. Resolved spontaneously within 48 hours.   Pancytopenia stable during hospitalization.  Goals of care Continuing DNR DNI. Pt complete HCPOA. Outpatient palliative care consult placed. Continued discussion with patient during hospitalization about severity of condition and ultimately transitioned to comfort care as above.  Hypokalemia Potassium was 2.2 on arrival, patient was given IV potassium supplementation with no change in the first 24 hours of admission.  As patient's mental status improved she was also given oral potassium repletion.  Stabilized on KCL 40 mEq tablets twice daily. Patient ultimately went comfort care and interventions above were discontinued.  Anasarca Likely secondary to her decompensated cirrhosis of the liver, however, given her history of hypothyroidism TSH was collected to rule out myxedema.  TSH returned and showed appropriate TSH level of 1.915.  Was given 1 round of IV Lasix  with some improvement, then restarted on home torsemide  10 mg.  Started aldactone  50 mg. Stable generalized edema at discharge but no major improvement. Patient ultimately went comfort care and interventions above were discontinued.  AKI secondary to decompensated cirrhosis of the liver, hyperammonemia Secondary to hyperammonemia related to her decompensated cirrhosis.  Improved with correction of ammonia levels.  Other chronic conditions were medically managed with home medications and  formulary alternatives as necessary (type 2 diabetes, hypertension, hypothyroidism, HLD, vitamin D  deficiency, portal venous thrombus, urinary retention)   Follow-up Recommendations: Patient going hospice care at [facility], no follow-up recommendations.   Disposition: Hospice  Discharge Condition: Comfort care  Discharge Exam:  Vitals:   12/29/23 2100 12/30/23 0727  BP: (!) 118/49 123/63  Pulse: (!) 110 (!) 113  Resp: 16   Temp: 99 F (37.2 C) 98.2 F (36.8 C)  SpO2: 92% 95%   General: Sitting up in bed, eyes open, nonresponsive Cardiovascular: RRR, normal S1/S2 Respiratory: Normal WOB on RA  Significant Procedures: None  Significant Labs and Imaging:  No results for input(s): "WBC", "HGB", "HCT", "PLT" in the last 48 hours. No results for input(s): "NA", "K", "CL", "CO2", "GLUCOSE", "BUN", "CREATININE", "CALCIUM ", "MG", "PHOS", "ALKPHOS", "AST", "ALT", "ALBUMIN", "PROTEIN" in the last 48 hours.  CTAP on admission showed hepatic cirrhosis portal hypertension  CT head on admission negative for acute pathology  MR brain on 5/20 negative for acute abnormality  US  ascites 5/24 had scattered ascites and peritoneal cavity  CTA PE on 5/26 negative for PE  Results/Tests Pending at Time of Discharge: None  Discharge Medications:  Allergies as of 12/30/2023       Reactions   Hydrocodone -acetaminophen     REACTION: Vomited and had toruble breathing in 2009   Nsaids    Makes sick on her stomach   Oxycodone-acetaminophen  Other (See Comments)   Pt hallucinates with this med   Tetracycline    REACTION: nausea   Bactrim  [sulfamethoxazole -trimethoprim ] Rash        Medication List     STOP taking these medications    bisacodyl 10 MG suppository Commonly known as: DULCOLAX   CALCIUM  1000 + D PO   Euthyrox  125 MCG tablet Generic drug: levothyroxine    folic acid  1 MG tablet Commonly known as: FOLVITE    Milk of Magnesia 1200 MG/15ML suspension Generic drug:  magnesium  hydroxide   omeprazole  20 MG capsule Commonly known as: PRILOSEC   PHOSPHATE ENEMA RE   rosuvastatin  20 MG tablet Commonly known as: CRESTOR    tamsulosin  0.4 MG Caps capsule Commonly known as: FLOMAX    traMADol  50 MG tablet Commonly known as: ULTRAM    vitamin D3 25 MCG tablet Commonly known as: CHOLECALCIFEROL        TAKE these medications    acetaminophen  325 MG tablet Commonly known as: TYLENOL  Take 2 tablets (650 mg total) by mouth every 8 (eight) hours as needed for mild pain (pain score 1-3).   albuterol  108 (90 Base) MCG/ACT inhaler Commonly known as: VENTOLIN  HFA Inhale 2 puffs into the lungs every 6 (six) hours as needed for wheezing or shortness of breath.   diclofenac  Sodium 1 % Gel Commonly known as: VOLTAREN  Apply 2 g topically 4 (four) times daily. What changed:  when to take this reasons to take this   fexofenadine  180 MG tablet Commonly known as: ALLEGRA  Take 1 tablet (180 mg total) by mouth daily.   fluticasone  50 MCG/ACT nasal spray Commonly known as: FLONASE  Place 2 sprays into both nostrils daily. Place 2 sprays in each nostril daily   Gerhardt's butt cream Crea Apply 1 Application topically daily.   HYDROCORTISONE  (TOPICAL) 1 % Gel Apply 1 application  topically as needed (skin irritation). Apply to sacrum area topically as needed.  Apply with incontinence care Gerhartt's Butt Cream.   lactulose  10 GM/15ML solution Commonly known as: CHRONULAC  Take 60 mLs (40 g total) by mouth 4 (four) times daily.  What changed:  how much to take when to take this   lidocaine  5 % Commonly known as: LIDODERM  Place 1 patch onto the skin at bedtime. Remove & Discard patch within 12 hours or as directed by MD   multivitamin tablet Take 1 tablet by mouth daily.   potassium chloride  SA 20 MEQ tablet Commonly known as: KLOR-CON  M Take 2 tablets (40 mEq total) by mouth 2 (two) times daily.   spironolactone  50 MG tablet Commonly known as:  ALDACTONE  Take 1 tablet (50 mg total) by mouth daily.   torsemide  10 MG tablet Commonly known as: DEMADEX  Take 1 tablet (10 mg total) by mouth daily. What changed:  medication strength how much to take        Discharge Instructions: Please refer to Patient Instructions section of EMR for full details.  Patient was counseled important signs and symptoms that should prompt return to medical care, changes in medications, dietary instructions, activity restrictions, and follow up appointments.   Follow-Up Appointments:   [name], MD 12/30/2023, 2:39 PM PGY-1, [facility] Family Medicine

## 2024-01-16 NOTE — Progress Notes (Deleted)
 Brianna Wheeler [id number] 12-06-1955   Chief Complaint:  Referring Provider: Madelon Donald HERO, DO Primary GI MD: Dr. Shila  HPI: Brianna Wheeler is a 68 y.o. female with past medical history of T2DM, HTN, hypothyroidism, HLD who presents today for ED follow-up and new diagnosis of NASH cirrhosis.    Patient admitted to [facility] 11/06/2023 to 11/25/2023 after presenting via EMS for 1 day of altered mental status.  In the ED patient found to be jaundiced, altered, febrile.  Labs significant for lactic acidosis, leukocytosis, elevated ammonia and CK.  She was started on broad-spectrum antibiotics and given IV fluids.  EEG concerning for diffuse encephalopathy likely related to toxic metabolic cause.  IR was consulted for LP and performed on 4/17.  Meningitis/encephalitis panel was negative, patient was treated with lactulose  and mental status greatly improved with combination of lactulose  and antibiotic treatment.  During admission CT A/P showed likely splenic and/or portal vein thrombus.  RUQ US  showed portal vein thrombus which was nearly occlusive with slow flow detected, suspected underlying cirrhosis.  GI was consulted and suspected new diagnosis of NASH cirrhosis with decompensation without known etiology.  She did have transaminitis, elevated bilirubin.  She underwent an EGD which was without varices but did show gastritis.  Additional problems during admission include abnormal uterine bleeding.  Patient reported chronic vaginal bleeding for about a month prior to admission and pelvic ultrasound showed thickened endometrial stripe concerning for neoplasm.  Unable to get endometrial biopsy while inpatient.  12/09/2023 to 12/30/2023 patient again admitted to the hospital after presenting with altered mental status, hypokalemia, hyperammonemia.  CT A/P showed hepatic cirrhosis with portal hypertension as well as mild cardiomegaly, ascites, and splenomegaly.  She was started on  lactulose  and titrated to 40 g 4 times daily daily with improvement.  She received rifaximin  while hospitalized but unfortunately this was determined to be unaffordable in the outpatient setting.  Patient had multiple conversations with palliative care and ultimately decided to transition to comfort care.  As her condition deteriorated she was evaluated as a candidate for hospice services.  She received a bed offer at [facility] and was discharged 6/9.   Previous GI Procedures/Imaging   EGD 11/06/2023 (Dr. Shila) - No gross lesions in the entire esophagus. - Z- line regular, 40 cm from the incisors.  - Small hiatal hernia.  - Gastritis. Biopsied.  - Normal examined duodenum. Path: A. STOMACH, BIOPSY:       Gastric antral mucosa with reactive epithelial changes.       Gastric oxyntic mucosa without significant diagnostic alteration.       No H. pylori identified on HE stain.       Negative for intestinal metaplasia or dysplasia.   Past Medical History:  Diagnosis Date   Diabetes mellitus    Hiatal hernia 09/19/2006   Qualifier: Diagnosis of   By: Manford Longs         Hyperlipidemia    Hypertension    Hypokalemia 07/28/2021   Hypomagnesemia 12/10/2023   Left foot pain 07/24/2003   Chronic injury -- multiple broken bones and torn ligaments after accident in garage    Past Surgical History:  Procedure Laterality Date   BIOPSY OF SKIN SUBCUTANEOUS TISSUE AND/OR MUCOUS MEMBRANE  11/13/2023   Procedure: BIOPSY, SKIN, SUBCUTANEOUS TISSUE, OR MUCOUS MEMBRANE;  Surgeon: Shila Gustav GAILS, MD;  Location: [facility];  Service: Gastroenterology;;   ESOPHAGOGASTRODUODENOSCOPY N/A 11/13/2023   Procedure: EGD (ESOPHAGOGASTRODUODENOSCOPY);  Surgeon: Nandigam, Kavitha  V, MD;  Location: [facility]OS[facility];  Service: Gastroenterology;  Laterality: N/A;   EYE SURGERY     LUMBAR DISC SURGERY     TUBAL LIGATION      No current outpatient medications on file.   No current facility-administered  medications for this visit.    Allergies as of 01/17/2024 - Review Complete 12/10/2023  Allergen Reaction Noted   Hydrocodone -acetaminophen   09/28/2009   Nsaids  03/25/2018   Oxycodone-acetaminophen  Other (See Comments) 09/20/2010   Tetracycline  02/11/2007   Bactrim  [sulfamethoxazole -trimethoprim ] Rash 04/06/2015    Family History  Problem Relation Age of Onset   Cancer Sister        brain   Diabetes Sister    Heart disease Sister    Heart attack Sister    Epilepsy Son     Social History   Tobacco Use   Smoking status: Never   Smokeless tobacco: Never  Vaping Use   Vaping status: Never Used  Substance Use Topics   Alcohol  use: No   Drug use: No     Review of Systems:    Constitutional: No weight loss, fever, chills, weakness or fatigue Eyes: No change in vision Ears, Nose, Throat:  No change in hearing or congestion Skin: No rash or itching Cardiovascular: No chest pain, chest pressure or palpitations   Respiratory: No SOB or cough Gastrointestinal: See HPI and otherwise negative Genitourinary: No dysuria or change in urinary frequency Neurological: No headache, dizziness or syncope Musculoskeletal: No new muscle or joint pain Hematologic: No bleeding or bruising    Physical Exam:  Vital signs: There were no vitals taken for this visit.  Constitutional: NAD, Well developed, Well nourished, alert and cooperative Head:  Normocephalic and atraumatic.  Eyes: No scleral icterus. Conjunctiva pink. Mouth: No oral lesions. Respiratory: Respirations even and unlabored. Lungs clear to auscultation bilaterally.  No wheezes, crackles, or rhonchi.  Cardiovascular:  Regular rate and rhythm. No murmurs. No peripheral edema. Gastrointestinal:  Soft, nondistended, nontender. No rebound or guarding. Normal bowel sounds. No appreciable masses or hepatomegaly. Rectal:  Not performed.  Neurologic:  Alert and oriented x4;  grossly normal neurologically.  Skin:   Dry and intact  without significant lesions or rashes. Psychiatric: Oriented to person, place and time. Demonstrates good judgement and reason without abnormal affect or behaviors.   RELEVANT LABS AND IMAGING: CBC    Component Value Date/Time   WBC 4.6 12/18/2023 0240   RBC 3.26 (L) 12/18/2023 0240   HGB 11.0 (L) 12/18/2023 0240   HGB 13.4 02/27/2022 1509   HCT 33.3 (L) 12/18/2023 0240   HCT 40.0 02/27/2022 1509   PLT 68 (L) 12/18/2023 0240   PLT 132 (L) 02/27/2022 1509   MCV 102.1 (H) 12/18/2023 0240   MCV 93 02/27/2022 1509   MCH 33.7 12/18/2023 0240   MCHC 33.0 12/18/2023 0240   RDW 17.5 (H) 12/18/2023 0240   RDW 15.2 02/27/2022 1509   LYMPHSABS 1.0 11/23/2023 0309   LYMPHSABS 1.2 02/27/2022 1509   MONOABS 0.4 11/23/2023 0309   EOSABS 0.3 11/23/2023 0309   EOSABS 0.1 02/27/2022 1509   BASOSABS 0.0 11/23/2023 0309   BASOSABS 0.1 02/27/2022 1509    CMP     Component Value Date/Time   NA 134 (L) 12/18/2023 0240   NA 138 02/25/2023 1059   K 3.7 12/18/2023 0240   CL 106 12/18/2023 0240   CO2 22 12/18/2023 0240   GLUCOSE 107 (H) 12/18/2023 0240   BUN 10 12/18/2023 0240  BUN 8 02/25/2023 1059   CREATININE 0.83 12/18/2023 0240   CREATININE 0.87 05/01/2016 1457   CALCIUM  8.1 (L) 12/18/2023 0240   CALCIUM  7.5 (L) 11/08/2023 1245   PROT 5.5 (L) 12/17/2023 0620   PROT 7.3 02/25/2023 1059   ALBUMIN <1.5 (L) 12/17/2023 0620   ALBUMIN 2.8 (L) 02/25/2023 1059   AST 90 (H) 12/17/2023 0620   ALT 40 12/17/2023 0620   ALKPHOS 81 12/17/2023 0620   BILITOT 3.4 (H) 12/17/2023 0620   BILITOT 2.0 (H) 02/25/2023 1059   GFRNONAA >60 12/18/2023 0240   GFRNONAA 72 12/07/2015 1421   GFRAA 71 01/28/2019 1500   GFRAA 83 12/07/2015 1421   Echocardiogram 11/06/2023 1. Left ventricular ejection fraction, by estimation, is 70 to 75% . The left ventricle has hyperdynamic function. The left ventricle has no regional wall motion abnormalities. Left ventricular diastolic parameters were normal.  2. Right  ventricular systolic function is normal. The right ventricular size is normal.  3. The mitral valve is normal in structure. Trivial mitral valve regurgitation. No evidence of mitral stenosis.  4. The aortic valve is normal in structure. Aortic valve regurgitation is not visualized. No aortic stenosis is present.  5. The inferior vena cava is normal in size with greater than 50% respiratory variability, suggesting right atrial pressure of 3 mmHg.  Assessment/Plan:       Camie Furbish, PA-C [facility] 01/16/2024, 4:17 PM  Patient Care Team: Madelon Donald HERO, DO as PCP - General (Family Medicine) Charmayne Molly, MD (Ophthalmology) Maree Paticia BRAVO, MD as Referring Physician (Ophthalmology) Iva Marty Saltness, MD as Consulting Physician (Allergy and Immunology)

## 2024-01-17 ENCOUNTER — Ambulatory Visit: Admitting: Gastroenterology

## 2024-01-21 DEATH — deceased

## 2024-01-22 ENCOUNTER — Other Ambulatory Visit: Payer: Self-pay | Admitting: Family Medicine

## 2024-01-22 DIAGNOSIS — E785 Hyperlipidemia, unspecified: Secondary | ICD-10-CM

## 2024-01-22 NOTE — Telephone Encounter (Signed)
 Pt now hospice.

## 2024-10-01 ENCOUNTER — Encounter
